# Patient Record
Sex: Male | Born: 1985 | Race: Black or African American | Hispanic: No | Marital: Single | Smoking: Former smoker
Health system: Southern US, Community
[De-identification: ages and names within clinical notes are randomized; demographics above are authoritative.]

## PROBLEM LIST (undated history)

## (undated) DIAGNOSIS — F419 Anxiety disorder, unspecified: Secondary | ICD-10-CM

## (undated) DIAGNOSIS — F32A Depression, unspecified: Secondary | ICD-10-CM

## (undated) DIAGNOSIS — F329 Major depressive disorder, single episode, unspecified: Secondary | ICD-10-CM

## (undated) DIAGNOSIS — G43909 Migraine, unspecified, not intractable, without status migrainosus: Secondary | ICD-10-CM

## (undated) DIAGNOSIS — IMO0001 Reserved for inherently not codable concepts without codable children: Secondary | ICD-10-CM

## (undated) DIAGNOSIS — R569 Unspecified convulsions: Secondary | ICD-10-CM

## (undated) DIAGNOSIS — Z531 Procedure and treatment not carried out because of patient's decision for reasons of belief and group pressure: Secondary | ICD-10-CM

## (undated) DIAGNOSIS — R51 Headache: Secondary | ICD-10-CM

## (undated) DIAGNOSIS — E109 Type 1 diabetes mellitus without complications: Secondary | ICD-10-CM

## (undated) DIAGNOSIS — R519 Headache, unspecified: Secondary | ICD-10-CM

## (undated) DIAGNOSIS — Z21 Asymptomatic human immunodeficiency virus [HIV] infection status: Secondary | ICD-10-CM

## (undated) DIAGNOSIS — B20 Human immunodeficiency virus [HIV] disease: Secondary | ICD-10-CM

## (undated) HISTORY — PX: FRACTURE SURGERY: SHX138

---
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
## 2006-09-21 ENCOUNTER — Ambulatory Visit: Payer: Self-pay | Admitting: Internal Medicine

## 2006-09-21 ENCOUNTER — Encounter (INDEPENDENT_AMBULATORY_CARE_PROVIDER_SITE_OTHER): Payer: Self-pay | Admitting: Nurse Practitioner

## 2006-09-21 ENCOUNTER — Emergency Department (HOSPITAL_COMMUNITY): Admission: EM | Admit: 2006-09-21 | Discharge: 2006-09-21 | Payer: Self-pay | Admitting: Emergency Medicine

## 2006-09-21 LAB — CONVERTED CEMR LAB: Microalb, Ur: 0.2 mg/dL (ref 0.00–1.89)

## 2006-09-22 ENCOUNTER — Ambulatory Visit: Payer: Self-pay | Admitting: Internal Medicine

## 2006-10-04 ENCOUNTER — Ambulatory Visit: Payer: Self-pay | Admitting: Family Medicine

## 2006-11-23 ENCOUNTER — Ambulatory Visit: Payer: Self-pay | Admitting: Internal Medicine

## 2006-12-13 ENCOUNTER — Ambulatory Visit: Payer: Self-pay | Admitting: *Deleted

## 2007-03-04 ENCOUNTER — Emergency Department (HOSPITAL_COMMUNITY): Admission: EM | Admit: 2007-03-04 | Discharge: 2007-03-04 | Payer: Self-pay | Admitting: Emergency Medicine

## 2007-03-08 ENCOUNTER — Ambulatory Visit: Payer: Self-pay | Admitting: Internal Medicine

## 2007-08-15 ENCOUNTER — Ambulatory Visit: Payer: Self-pay | Admitting: Internal Medicine

## 2008-01-08 ENCOUNTER — Ambulatory Visit: Payer: Self-pay | Admitting: Family Medicine

## 2008-05-01 ENCOUNTER — Ambulatory Visit: Payer: Self-pay | Admitting: Family Medicine

## 2008-07-14 ENCOUNTER — Ambulatory Visit: Payer: Self-pay | Admitting: Internal Medicine

## 2008-08-05 ENCOUNTER — Ambulatory Visit: Payer: Self-pay | Admitting: Internal Medicine

## 2009-05-29 ENCOUNTER — Emergency Department (HOSPITAL_COMMUNITY): Admission: EM | Admit: 2009-05-29 | Discharge: 2009-05-30 | Payer: Self-pay | Admitting: Emergency Medicine

## 2009-08-21 ENCOUNTER — Ambulatory Visit: Payer: Self-pay | Admitting: Internal Medicine

## 2009-08-21 LAB — CONVERTED CEMR LAB: Microalb, Ur: 5.5 mg/dL — ABNORMAL HIGH (ref 0.00–1.89)

## 2009-11-13 ENCOUNTER — Emergency Department (HOSPITAL_COMMUNITY): Admission: EM | Admit: 2009-11-13 | Discharge: 2009-11-13 | Payer: Self-pay | Admitting: Emergency Medicine

## 2009-11-16 ENCOUNTER — Emergency Department (HOSPITAL_COMMUNITY): Admission: EM | Admit: 2009-11-16 | Discharge: 2009-11-16 | Payer: Self-pay | Admitting: Emergency Medicine

## 2010-04-09 ENCOUNTER — Emergency Department (HOSPITAL_COMMUNITY)
Admission: EM | Admit: 2010-04-09 | Discharge: 2010-04-09 | Disposition: A | Discharge status: Home / Self Care | Payer: Self-pay | Attending: Emergency Medicine | Admitting: Emergency Medicine

## 2010-04-09 DIAGNOSIS — Z794 Long term (current) use of insulin: Secondary | ICD-10-CM | POA: Insufficient documentation

## 2010-04-09 DIAGNOSIS — K089 Disorder of teeth and supporting structures, unspecified: Secondary | ICD-10-CM | POA: Insufficient documentation

## 2010-04-09 DIAGNOSIS — R599 Enlarged lymph nodes, unspecified: Secondary | ICD-10-CM | POA: Insufficient documentation

## 2010-04-09 DIAGNOSIS — J029 Acute pharyngitis, unspecified: Secondary | ICD-10-CM | POA: Insufficient documentation

## 2010-04-09 DIAGNOSIS — R5383 Other fatigue: Secondary | ICD-10-CM | POA: Insufficient documentation

## 2010-04-09 DIAGNOSIS — H9209 Otalgia, unspecified ear: Secondary | ICD-10-CM | POA: Insufficient documentation

## 2010-04-09 DIAGNOSIS — E119 Type 2 diabetes mellitus without complications: Secondary | ICD-10-CM | POA: Insufficient documentation

## 2010-04-09 DIAGNOSIS — R5381 Other malaise: Secondary | ICD-10-CM | POA: Insufficient documentation

## 2010-04-09 DIAGNOSIS — R509 Fever, unspecified: Secondary | ICD-10-CM | POA: Insufficient documentation

## 2010-04-09 DIAGNOSIS — K029 Dental caries, unspecified: Secondary | ICD-10-CM | POA: Insufficient documentation

## 2010-04-09 LAB — GLUCOSE, CAPILLARY: Glucose-Capillary: 245 mg/dL — ABNORMAL HIGH (ref 70–99)

## 2010-05-13 LAB — GLUCOSE, CAPILLARY
Glucose-Capillary: 133 mg/dL — ABNORMAL HIGH (ref 70–99)
Glucose-Capillary: 145 mg/dL — ABNORMAL HIGH (ref 70–99)

## 2010-05-13 LAB — POCT URINALYSIS DIPSTICK
Bilirubin Urine: NEGATIVE
Hgb urine dipstick: NEGATIVE
Ketones, ur: NEGATIVE mg/dL
Protein, ur: 30 mg/dL — AB
pH: 6.5 (ref 5.0–8.0)

## 2010-05-19 LAB — GLUCOSE, CAPILLARY: Glucose-Capillary: 227 mg/dL — ABNORMAL HIGH (ref 70–99)

## 2010-05-19 LAB — POCT I-STAT, CHEM 8
BUN: 8 mg/dL (ref 6–23)
Calcium, Ion: 1.19 mmol/L (ref 1.12–1.32)
Creatinine, Ser: 0.8 mg/dL (ref 0.4–1.5)
Glucose, Bld: 214 mg/dL — ABNORMAL HIGH (ref 70–99)
Hemoglobin: 18.4 g/dL — ABNORMAL HIGH (ref 13.0–17.0)
TCO2: 25 mmol/L (ref 0–100)

## 2010-05-19 LAB — URINALYSIS, ROUTINE W REFLEX MICROSCOPIC
Glucose, UA: >1000 mg/dL — AB
Ketones, ur: 40 mg/dL — AB
Leukocytes, UA: NEGATIVE
Nitrite: NEGATIVE
Specific Gravity, Urine: >1.046 — ABNORMAL HIGH (ref 1.005–1.030)
pH: 5.5 (ref 5.0–8.0)

## 2010-05-19 LAB — URINE MICROSCOPIC-ADD ON

## 2010-07-16 NOTE — H&P (Signed)
[facility]  Patient:    [name]                        MRN: [id number] Adm. Date:  [id number] Attending:  Julian Hy CC:         Tera Mater. Evlyn Kanner, M.D.                         History and Physical  CHIEF COMPLAINT:  Diabetic ketoacidosis.  HISTORY OF PRESENT ILLNESS:  The patient is a 25 year old black male with a history of diabetes mellitus type 1 and medical noncompliance.  Today he awoke at approximately 0630 hours with nausea, vomiting, and headache.  He had been fine at bedtime last night, and claims to be taking his insulin regularly and checking is blood sugar levels three times daily.  He denies symptoms of an upper respiratory infection but complains of mild left-sided abdominal pain.  He has no recent ill contacts.  Currently he complains of moderate nausea, with no vomiting since presenting to the emergency room.  His headache is approximately 5/10 in intensity. He denies symptoms of dysuria or diarrhea.  He had been admitted from February 23, 1999 to February 24, 1999 and did well with intravenous fluids and insulin treatment for mild diabetic ketoacidosis.  PAST MEDICAL HISTORY:  He has had diabetes mellitus type 1 since age 48 years. e has about three episodes of ketoacidosis per year.  According to his previous endocrinologist, he generally has a hemoglobin A1C level in the 13 range.  He has a history of asthma and seasonal allergic rhinitis.  MEDICATIONS PRIOR TO ADMISSION:  Humulin Ultralente 10 units subcu in a.m. with  Humalog insulin 5 units subcu  in a.m.  Humulin Ultralente 7 units subcu at dinnertime, with Humalog 5 units subcu at dinnertime.  ALLERGIES:  No known drug allergies.  PAST SURGICAL HISTORY:  None.  FAMILY HISTORY:  Father died at age 23 years of gunshot wound.  Mother is 58 years old, has hypertension, but is otherwise alive and well.  He has three sisters.  There are multiple  grandparents with diabetes mellitus.  SOCIAL HISTORY:  He lives with his mother and siblings in [place], [place]. Currently he is staying with his grandmother and great-grandmother in the [place] area since the start of the Christmas break.  He denies tobacco or alcohol use.  REVIEW OF SYSTEMS:  Significant for a moderately severe headache, with nausea and vomiting today.  He denies fever, diarrhea, or dysuria.  PHYSICAL EXAMINATION:  VITAL SIGNS:  Blood pressure 129/78, pulse 98, respiratory rate 28, temperature  99.8.  Oxygen saturation on room air 98%.  GENERAL:  Well-nourished, well-developed, black male who appears somewhat withdrawn.  He speaks only with great reluctance, but is alert and oriented x 4. The history was generally obtained from family members present as he did not respond to questions unless directly asked.  HEENT:  Pupils were equal, round, and reactive to light and accommodation. Extraocular movements were intact.  Fundus was difficult to examine as he would not open his eyes well and did not cooperate with that part of the examination. TMs were normal.  Throat was within normal limits.  NECK:  Supple with a full range of motion.  CHEST:  Clear to auscultation.  HEART:  Regular rate and rhythm, S1 and S2 without murmur, gallop, or rub.  ABDOMEN:  Normal bowel  sounds with no evidence of hepatosplenomegaly.  There was very mild left upper quadrant tenderness without rebound.  EXTREMITIES:  Without cyanosis, clubbing, or edema.  NEUROLOGIC:  He is lying with his eyes closed but will open his eyes if asked to do so, and he is oriented x 4.  Cranial nerves II-XII are intact.  Sensory exam was grossly normal.  Motor exam had 4/5 muscle strength in all extremities. However, it is unclear if the weakness is due to poor effort on his part.  Cerebellar function was not assessed, nor was gait.  LABORATORY STUDIES:  WBC 5.6, hemoglobin 15.8,  hematocrit 46.3, platelet count 31. Serum sodium 135, potassium 5.0, chloride 104, CO2 10, BUN 22, creatinine 1.1, glucose 373, calcium 11.0, albumin 4.7, total bilirubin 1.5, alkaline phosphatase 257, ALT 19.  Urinalysis:  Specific gravity 1.028, pH 5.0, glucose greater than  1000, nitrite negative.  Ketones greater than than 80 mg/dL.  Arterial blood gas: pH 7.22, pCO2 22.  IMPRESSION: 1. Diabetic ketoacidosis.  This is due to relatively inadequate insulin treatment. It is unclear if he is complying with his insulin regimen even though he denies this.  He may have an occult viral infection, but he has a paucity of symptoms to suggest this.  He is in need of additional teaching regarding diabetes, specifically with regards to sick-day management and sliding scale Humalog treatment.  He may do a bit better if his Ultralente insulin is given at bedtime to ensure adequate levels in the morning.  I plan to initiate IV insulin treatment and give supplemental potassium as needed with intravenous fluids.  2. He will be given further teaching regarding flexible insulin dosing while an  inpatient.  3. Asthma.  This appears to be stable at present.  4. Headache.  This is likely due to his ketoacidosis.  I doubt that he has incipient cerebral edema, although this is possible with diabetic ketoacidosis. If his headache worsens, we will obtain a CT scan to evaluate for this unlikely possibility.  I doubt that he has meningitis with the lack of fever and a stiff  neck.DD:  02/27/99 TD:  02/27/99 Job: [id number]

## 2010-09-12 ENCOUNTER — Emergency Department (HOSPITAL_COMMUNITY)
Admission: EM | Admit: 2010-09-12 | Discharge: 2010-09-12 | Disposition: A | Discharge status: Home / Self Care | Payer: Self-pay | Attending: Emergency Medicine | Admitting: Emergency Medicine

## 2010-09-12 DIAGNOSIS — K047 Periapical abscess without sinus: Secondary | ICD-10-CM | POA: Insufficient documentation

## 2010-09-12 DIAGNOSIS — Z794 Long term (current) use of insulin: Secondary | ICD-10-CM | POA: Insufficient documentation

## 2010-09-12 DIAGNOSIS — E119 Type 2 diabetes mellitus without complications: Secondary | ICD-10-CM | POA: Insufficient documentation

## 2010-09-12 DIAGNOSIS — R220 Localized swelling, mass and lump, head: Secondary | ICD-10-CM | POA: Insufficient documentation

## 2010-09-12 DIAGNOSIS — K089 Disorder of teeth and supporting structures, unspecified: Secondary | ICD-10-CM | POA: Insufficient documentation

## 2010-09-12 DIAGNOSIS — R221 Localized swelling, mass and lump, neck: Secondary | ICD-10-CM | POA: Insufficient documentation

## 2010-09-15 ENCOUNTER — Emergency Department (HOSPITAL_COMMUNITY)
Admission: EM | Admit: 2010-09-15 | Discharge: 2010-09-15 | Disposition: A | Discharge status: Home / Self Care | Payer: Self-pay | Attending: Emergency Medicine | Admitting: Emergency Medicine

## 2010-09-15 DIAGNOSIS — R220 Localized swelling, mass and lump, head: Secondary | ICD-10-CM | POA: Insufficient documentation

## 2010-09-15 DIAGNOSIS — K089 Disorder of teeth and supporting structures, unspecified: Secondary | ICD-10-CM | POA: Insufficient documentation

## 2010-09-15 DIAGNOSIS — R112 Nausea with vomiting, unspecified: Secondary | ICD-10-CM | POA: Insufficient documentation

## 2010-09-15 DIAGNOSIS — R42 Dizziness and giddiness: Secondary | ICD-10-CM | POA: Insufficient documentation

## 2010-09-15 DIAGNOSIS — E119 Type 2 diabetes mellitus without complications: Secondary | ICD-10-CM | POA: Insufficient documentation

## 2010-09-15 DIAGNOSIS — K047 Periapical abscess without sinus: Secondary | ICD-10-CM | POA: Insufficient documentation

## 2010-10-02 ENCOUNTER — Emergency Department (HOSPITAL_COMMUNITY): Payer: Self-pay

## 2010-10-02 ENCOUNTER — Inpatient Hospital Stay (HOSPITAL_COMMUNITY)
Admission: EM | Admit: 2010-10-02 | Discharge: 2010-10-05 | DRG: 638 | Disposition: A | Discharge status: Home / Self Care | Payer: Self-pay | Attending: Internal Medicine | Admitting: Internal Medicine

## 2010-10-02 ENCOUNTER — Encounter: Payer: Self-pay | Admitting: Internal Medicine

## 2010-10-02 DIAGNOSIS — B9789 Other viral agents as the cause of diseases classified elsewhere: Secondary | ICD-10-CM | POA: Diagnosis present

## 2010-10-02 DIAGNOSIS — E109 Type 1 diabetes mellitus without complications: Secondary | ICD-10-CM

## 2010-10-02 DIAGNOSIS — E1110 Type 2 diabetes mellitus with ketoacidosis without coma: Secondary | ICD-10-CM | POA: Insufficient documentation

## 2010-10-02 DIAGNOSIS — J45901 Unspecified asthma with (acute) exacerbation: Secondary | ICD-10-CM | POA: Diagnosis present

## 2010-10-02 DIAGNOSIS — E1010 Type 1 diabetes mellitus with ketoacidosis without coma: Secondary | ICD-10-CM

## 2010-10-02 DIAGNOSIS — J45909 Unspecified asthma, uncomplicated: Secondary | ICD-10-CM | POA: Insufficient documentation

## 2010-10-02 DIAGNOSIS — Z794 Long term (current) use of insulin: Secondary | ICD-10-CM

## 2010-10-02 DIAGNOSIS — J069 Acute upper respiratory infection, unspecified: Secondary | ICD-10-CM | POA: Diagnosis present

## 2010-10-02 DIAGNOSIS — Z23 Encounter for immunization: Secondary | ICD-10-CM

## 2010-10-02 LAB — POCT I-STAT 3, VENOUS BLOOD GAS (G3P V)
Acid-base deficit: 12 mmol/L — ABNORMAL HIGH (ref 0.0–2.0)
Bicarbonate: 12.5 mEq/L — ABNORMAL LOW (ref 20.0–24.0)
TCO2: 13 mmol/L (ref 0–100)

## 2010-10-02 LAB — HEPATIC FUNCTION PANEL
ALT: 84 U/L — ABNORMAL HIGH (ref 0–53)
AST: 64 U/L — ABNORMAL HIGH (ref 0–37)
Albumin: 3.7 g/dL (ref 3.5–5.2)
Alkaline Phosphatase: 92 U/L (ref 39–117)
Total Protein: 7.4 g/dL (ref 6.0–8.3)

## 2010-10-02 LAB — BASIC METABOLIC PANEL
BUN: 13 mg/dL (ref 6–23)
CO2: 22 mEq/L (ref 19–32)
Calcium: 10 mg/dL (ref 8.4–10.5)
Chloride: 92 mEq/L — ABNORMAL LOW (ref 96–112)
Chloride: 104 mEq/L (ref 96–112)
Chloride: 107 mEq/L (ref 96–112)
Creatinine, Ser: 0.95 mg/dL (ref 0.50–1.35)
Creatinine, Ser: 0.74 mg/dL (ref 0.50–1.35)
GFR calc Af Amer: >60 mL/min (ref >60)
GFR calc Af Amer: >60 mL/min (ref >60)
Glucose, Bld: 320 mg/dL — ABNORMAL HIGH (ref 70–99)
Glucose, Bld: 207 mg/dL — ABNORMAL HIGH (ref 70–99)
Potassium: 4.4 mEq/L (ref 3.5–5.1)
Sodium: 131 mEq/L — ABNORMAL LOW (ref 135–145)
Sodium: 137 mEq/L (ref 135–145)

## 2010-10-02 LAB — DIFFERENTIAL
Eosinophils Absolute: 0 10*3/uL (ref 0.0–0.7)
Lymphs Abs: 0.8 10*3/uL (ref 0.7–4.0)
Monocytes Absolute: 0.3 10*3/uL (ref 0.1–1.0)
Monocytes Relative: 2 % — ABNORMAL LOW (ref 3–12)
Neutrophils Relative %: 91 % — ABNORMAL HIGH (ref 43–77)

## 2010-10-02 LAB — HEMOGLOBIN A1C: Mean Plasma Glucose: 346 mg/dL — ABNORMAL HIGH (ref <117)

## 2010-10-02 LAB — GLUCOSE, CAPILLARY
Glucose-Capillary: >600 mg/dL (ref 70–99)
Glucose-Capillary: 362 mg/dL — ABNORMAL HIGH (ref 70–99)
Glucose-Capillary: 297 mg/dL — ABNORMAL HIGH (ref 70–99)

## 2010-10-02 LAB — POCT I-STAT, CHEM 8
Calcium, Ion: 1.19 mmol/L (ref 1.12–1.32)
Creatinine, Ser: 0.8 mg/dL (ref 0.50–1.35)
Glucose, Bld: 676 mg/dL (ref 70–99)
HCT: 48 % (ref 39.0–52.0)
Hemoglobin: 16.3 g/dL (ref 13.0–17.0)
Potassium: 4.5 mEq/L (ref 3.5–5.1)
TCO2: 13 mmol/L (ref 0–100)

## 2010-10-02 LAB — CBC
MCH: 30.9 pg (ref 26.0–34.0)
MCHC: 35.2 g/dL (ref 30.0–36.0)
MCV: 87.8 fL (ref 78.0–100.0)
Platelets: 292 10*3/uL (ref 150–400)
RBC: 4.66 MIL/uL (ref 4.22–5.81)

## 2010-10-02 LAB — URINALYSIS, ROUTINE W REFLEX MICROSCOPIC
Bilirubin Urine: NEGATIVE
Glucose, UA: >1000 mg/dL — AB
Hgb urine dipstick: NEGATIVE
Ketones, ur: >80 mg/dL — AB
pH: 5 (ref 5.0–8.0)

## 2010-10-02 LAB — STREP PNEUMONIAE URINARY ANTIGEN: Strep Pneumo Urinary Antigen: NEGATIVE

## 2010-10-02 LAB — HIV ANTIBODY (ROUTINE TESTING W REFLEX): HIV: NONREACTIVE

## 2010-10-02 NOTE — H&P (Unsigned)
[name] is an 25 y.o. male with a history of DM type I and asthma presenting to the ED today for fatigue and chest pain   Chief Complaint: *** HPI: ***  Yesterday [name] reports beginning to feel sinus congestion, rhinorrhea, cough, and a pleuritic chest pain.  He also reports feeling feverish and having chills, though he did not take his temperature.  The symptoms began in the morning and have progressed since then.  He took tylenol which relieved his chest pain and chills for a couple of hours.  Over night he called his sister to ask her to bring a nebulizer treatment because he was having trouble breathing.  She reassured him that he was just having cold symptoms and did not bring the nebulizer.  This morning he reports feeling extremely fatigued and weak.  He says that it took all he could manage to get out of bed to come in.  When he did get up, he reported feeling nauseated and vomited once.  Since that time he has had a nagging pain in his RLQ, which is resolving.    Patient endorses having some loose stools beginning a few days ago.  He also reports polyuria and polydipsia beginning yesterday.  He reports infrequent checks of his blood sugar.  He  had to switch from 75/25 to 70/30 insulin regimen in April, which he feels has been to the detriment of his glucose control.  Since that time, he has noticed a significant weight loss.  He was near 200 lbs in April; this weight loss also coincides with the start of a new, more active job.  This change to his insulin medications occurred when he stopped being seen at [facility].  He reports that sometimes his medications are hard to obtain due to financial concerns.  PMH: ***  DM Type I: takes insulin 70/30.  Not vigilant about checking BG.  Does not have PCP  Asthma: not currently taking any medication, does report occasional wheezing  No past surgical history  Family History: Mother: Cornea replacement, hypothyroidism, Father:  healthy, deceased       Other: breast cancer, glaucoma, diabetes type I  Social History:  Currently living with a friend.  Recently employed in an active job- lifting and moving things.  Walks for exercise.   Former smoker, quit over 5 years ago.   ETOH- occasion, not during the summer when it's "too hot" No illicit drug use No herbal or OTC, except occasional acetaminophen   Allergies: Shellfish- anaphylaxis  ED Medication  1L NS bolus x3 IV insulin  IV Potassium 40 mEq Albuterol Ipratropium Prednisone 60 mg x 1   No current outpatient prescriptions on file as of 10/02/2010.  Insulin 70/30  Results for orders placed during the hospital encounter of 10/02/10 (from the past 48 hour(s))  POCT I-STAT, CHEM 8     Status: Abnormal   Collection Time   10/02/10 12:11 PM      Component Value Range Comment   Sodium 131 (*) 135 - 145 (mEq/L)    Potassium 4.5  3.5 - 5.1 (mEq/L)    Chloride 101  96 - 112 (mEq/L)    BUN 12  6 - 23 (mg/dL)    Creatinine, Ser 9.56  0.50 - 1.35 (mg/dL)    Glucose, Bld 213 (*) 70 - 99 (mg/dL)    Calcium, Ion 0.86  1.12 - 1.32 (mmol/L)    TCO2 13  0 - 100 (mmol/L)    Hemoglobin  16.3  13.0 - 17.0 (g/dL)    HCT 04.5  40.9 - 81.1 (%)    Comment NOTIFIED PHYSICIAN   Anion Gap-16 Delt/Delt =/ 1  URINALYSIS, ROUTINE W REFLEX MICROSCOPIC     Status: Abnormal   Collection Time   10/02/10 12:12 PM      Component Value Range Comment   Color, Urine YELLOW  YELLOW     Appearance CLEAR  CLEAR     Specific Gravity, Urine 1.035 (*) 1.005 - 1.030     pH 5.0  5.0 - 8.0     Glucose, UA >1000 (*) NEGATIVE (mg/dL)    Hgb urine dipstick NEGATIVE  NEGATIVE     Bilirubin Urine NEGATIVE  NEGATIVE     Ketones, ur >80 (*) NEGATIVE (mg/dL)    Protein, ur NEGATIVE  NEGATIVE (mg/dL)    Urobilinogen, UA 0.2  0.0 - 1.0 (mg/dL)    Nitrite NEGATIVE  NEGATIVE     Leukocytes, UA NEGATIVE  NEGATIVE    URINE MICROSCOPIC-ADD ON     Status: Normal   Collection Time   10/02/10 12:12 PM       Component Value Range Comment   Urine-Other        Value: NO FORMED ELEMENTS SEEN ON URINE MICROSCOPIC EXAMINATION  DIFFERENTIAL WITH WBC     Status: Abnormal   Collection Time   10/02/10 12:15 PM      Component Value Range Comment   Neutrophils Relative 91 (*) 43 - 77 (%)    Neutro Abs 10.8 (*) 1.7 - 7.7 (K/uL)    Lymphocytes Relative 7 (*) 12 - 46 (%)    Lymphs Abs 0.8  0.7 - 4.0 (K/uL)    Monocytes Relative 2 (*) 3 - 12 (%)    Monocytes Absolute 0.3  0.1 - 1.0 (K/uL)    Eosinophils Relative 0  0 - 5 (%)    Eosinophils Absolute 0.0  0.0 - 0.7 (K/uL)    Basophils Relative 0  0 - 1 (%)    Basophils Absolute 0.0  0.0 - 0.1 (K/uL)   CBC     Status: Abnormal   Collection Time   10/02/10 12:15 PM      Component Value Range Comment   WBC 11.9 (*) 4.0 - 10.5 (K/uL)    RBC 4.66  4.22 - 5.81 (MIL/uL)    Hemoglobin 14.4  13.0 - 17.0 (g/dL)    HCT 91.4  78.2 - 95.6 (%)    MCV 87.8  78.0 - 100.0 (fL)    MCH 30.9  26.0 - 34.0 (pg)    MCHC 35.2  30.0 - 36.0 (g/dL)    RDW 21.3  08.6 - 57.8 (%)    Platelets 292  150 - 400 (K/uL)   BASIC METABOLIC PANEL     Status: Abnormal   Collection Time   10/02/10 12:15 PM      Component Value Range Comment   Sodium 131 (*) 135 - 145 (mEq/L)    Potassium 4.5  3.5 - 5.1 (mEq/L)    Chloride 92 (*) 96 - 112 (mEq/L) DELTA CHECK NOTED   CO2 13 (*) 19 - 32 (mEq/L)    Glucose, Bld 662 (*) 70 - 99 (mg/dL)    BUN 13  6 - 23 (mg/dL)    Creatinine, Ser 4.69  0.50 - 1.35 (mg/dL)    Calcium 62.9  8.4 - 10.5 (mg/dL)    GFR calc non Af Amer >60  >60 (mL/min)  GFR calc Af Amer >60  >60 (mL/min)   GLUCOSE, CAPILLARY     Status: Abnormal   Collection Time   10/02/10 12:33 PM      Component Value Range Comment   Glucose-Capillary >600 (*) 70 - 99 (mg/dL)   POCT I-STAT 3, BLOOD GAS (G3P V)     Status: Abnormal   Collection Time   10/02/10 12:40 PM      Component Value Range Comment   pH, Ven 7.291  7.250 - 7.300     pCO2, Ven 25.9 (*) 45.0 - 50.0 (mmHg)    pO2, Ven  69.0 (*) 30.0 - 45.0 (mmHg)    Bicarbonate 12.5 (*) 20.0 - 24.0 (mEq/L)    TCO2 13  0 - 100 (mmol/L)    O2 Saturation 92.0      Acid-base deficit 12.0 (*) 0.0 - 2.0 (mmol/L)    Sample type VENOUS     GLUCOSE, CAPILLARY     Status: Abnormal   Collection Time   10/02/10  1:54 PM      Component Value Range Comment   Glucose-Capillary >600 (*) 70 - 99 (mg/dL)    Comment 1 Notify RN      Comment 2 Call MD NNP PA CNM     GLUCOSE, CAPILLARY     Status: Abnormal   Collection Time   10/02/10  2:03 PM      Component Value Range Comment   Glucose-Capillary 594 (*) 70 - 99 (mg/dL)    @[id number] @  Review of Systems  Constitutional: Negative.        Except per HPI  Eyes: Positive for photophobia.  Respiratory: Negative.        Except per HPI  Cardiovascular: Negative.   Gastrointestinal: Negative.   Genitourinary: Negative.   Musculoskeletal: Negative.   Skin: Negative.   Neurological: Positive for headaches.  Endo/Heme/Allergies: Negative.        Except per HPI  Psychiatric/Behavioral: Negative.     BVitals: Temp: 98.8  BP: 129/77  HR: 107  Resp: 20  O2sat: 100% on RA  Physical Exam  Constitutional: He is oriented to person, place, and time. He appears well-developed. No distress.  HENT:  Head: Normocephalic and atraumatic.  Right Ear: External ear normal.  Left Ear: External ear normal.  Mouth/Throat: No oropharyngeal exudate.       Erythematous oropharynx  Eyes: Conjunctivae and EOM are normal. Pupils are equal, round, and reactive to light. Right eye exhibits no discharge. Left eye exhibits no discharge. No scleral icterus.  Neck: Normal range of motion. Neck supple. No tracheal deviation present.  Cardiovascular: Regular rhythm, normal heart sounds and intact distal pulses.  Exam reveals no gallop and no friction rub.   No murmur heard.      tachycardic  Respiratory: Effort normal. No stridor. No respiratory distress. He has wheezes. He has no rales. He exhibits no  tenderness.  GI: Soft. Bowel sounds are normal. He exhibits no distension and no mass. There is tenderness. There is no rebound and no guarding.       Mild tenderness to palpation in RLQ  Musculoskeletal: Normal range of motion. He exhibits no edema and no tenderness.  Lymphadenopathy:    He has cervical adenopathy.  Neurological: He is alert and oriented to person, place, and time. He has normal reflexes. No cranial nerve deficit. He exhibits normal muscle tone.  Skin: Skin is warm and dry. No rash noted. He is not diaphoretic. No erythema. No pallor.  Hypopigmented scar on right tibia 2/2 trauma about 6 weeks ago  Psychiatric: He has a normal mood and affect. His behavior is normal. Judgment and thought content normal.     Assessment/Plan ***[name] is a 25 y/o man in DKA.  1.  DKA- anion gap of 16, pH of 7.291>1000 ketones in urine, BG 662 with concurrent metabolic alkalosis, most likely 2/2/ vomitting - Admit pt. Inpatient, ICU bed - 1L IV NS x 3 given in ER - IV insulin, per DKA protocol - 40 mEq K+ given in ED.  Replete per DKA protocol - BMET stat q2 until gap resolves - CBG q2  - NPO - Evaluate for infectious source - BCx - S. pneumo and legionella urine ag - MRSA PCR screening - Empiric moxifloxacin 400 mg  2.  DM type 1- currently in DKA - HbA1c - resume maintenance treatment with resolution of DKA - Pt. Needs PCP - Pt. Needs assistance with medication financing  3. Asthma- not taking any medications at home currently, Bilateral basilar expiratory wheezes. - Was given prednisone 60 mg PO x1, will not continue due to DKA - Albuterol inhaler q4 - Ipratropium inhaler q8   4.  Prophylaxis/Public Health:  - lovenox - HIV ab   [name] 10/02/2010, 3:07 PM

## 2010-10-03 LAB — BASIC METABOLIC PANEL
BUN: 10 mg/dL (ref 6–23)
BUN: 10 mg/dL (ref 6–23)
BUN: 10 mg/dL (ref 6–23)
BUN: 11 mg/dL (ref 6–23)
CO2: 23 mEq/L (ref 19–32)
Calcium: 9.2 mg/dL (ref 8.4–10.5)
Chloride: 107 mEq/L (ref 96–112)
Chloride: 105 mEq/L (ref 96–112)
Creatinine, Ser: 0.64 mg/dL (ref 0.50–1.35)
Creatinine, Ser: 0.91 mg/dL (ref 0.50–1.35)
GFR calc Af Amer: >60 mL/min (ref >60)
GFR calc non Af Amer: >60 mL/min (ref >60)
GFR calc non Af Amer: >60 mL/min (ref >60)
Glucose, Bld: 93 mg/dL (ref 70–99)
Glucose, Bld: 211 mg/dL — ABNORMAL HIGH (ref 70–99)
Glucose, Bld: 248 mg/dL — ABNORMAL HIGH (ref 70–99)
Potassium: 3.8 mEq/L (ref 3.5–5.1)
Potassium: 3.9 mEq/L (ref 3.5–5.1)
Potassium: 3.9 mEq/L (ref 3.5–5.1)

## 2010-10-03 LAB — CBC
HCT: 36.8 % — ABNORMAL LOW (ref 39.0–52.0)
Hemoglobin: 12.9 g/dL — ABNORMAL LOW (ref 13.0–17.0)
WBC: 11.4 10*3/uL — ABNORMAL HIGH (ref 4.0–10.5)

## 2010-10-03 LAB — GLUCOSE, CAPILLARY
Glucose-Capillary: 143 mg/dL — ABNORMAL HIGH (ref 70–99)
Glucose-Capillary: 96 mg/dL (ref 70–99)

## 2010-10-03 LAB — LEGIONELLA ANTIGEN, URINE

## 2010-10-03 LAB — AST: AST: 24 U/L (ref 0–37)

## 2010-10-03 LAB — ALT: ALT: 51 U/L (ref 0–53)

## 2010-10-04 DIAGNOSIS — E1010 Type 1 diabetes mellitus with ketoacidosis without coma: Secondary | ICD-10-CM

## 2010-10-04 LAB — BASIC METABOLIC PANEL
BUN: 11 mg/dL (ref 6–23)
CO2: 26 mEq/L (ref 19–32)
CO2: 26 mEq/L (ref 19–32)
Calcium: 9.1 mg/dL (ref 8.4–10.5)
Chloride: 105 mEq/L (ref 96–112)
Chloride: 102 mEq/L (ref 96–112)
Creatinine, Ser: 0.72 mg/dL (ref 0.50–1.35)
GFR calc Af Amer: >60 mL/min (ref >60)
Glucose, Bld: 156 mg/dL — ABNORMAL HIGH (ref 70–99)
Sodium: 137 mEq/L (ref 135–145)

## 2010-10-04 LAB — GLUCOSE, CAPILLARY
Glucose-Capillary: 230 mg/dL — ABNORMAL HIGH (ref 70–99)
Glucose-Capillary: 175 mg/dL — ABNORMAL HIGH (ref 70–99)
Glucose-Capillary: 223 mg/dL — ABNORMAL HIGH (ref 70–99)
Glucose-Capillary: 82 mg/dL (ref 70–99)

## 2010-10-04 LAB — CBC
HCT: 40.3 % (ref 39.0–52.0)
Hemoglobin: 14.1 g/dL (ref 13.0–17.0)
MCH: 30.1 pg (ref 26.0–34.0)
MCV: 85.9 fL (ref 78.0–100.0)
RBC: 4.69 MIL/uL (ref 4.22–5.81)

## 2010-10-05 LAB — CBC
HCT: 39.4 % (ref 39.0–52.0)
MCHC: 35 g/dL (ref 30.0–36.0)
MCV: 86.6 fL (ref 78.0–100.0)
Platelets: 288 10*3/uL (ref 150–400)
RDW: 14.5 % (ref 11.5–15.5)
WBC: 8.5 10*3/uL (ref 4.0–10.5)

## 2010-10-05 LAB — MAGNESIUM: Magnesium: 1.9 mg/dL (ref 1.5–2.5)

## 2010-10-05 LAB — BASIC METABOLIC PANEL
BUN: 12 mg/dL (ref 6–23)
Chloride: 102 mEq/L (ref 96–112)
Creatinine, Ser: 0.69 mg/dL (ref 0.50–1.35)
GFR calc Af Amer: >60 mL/min (ref >60)
GFR calc non Af Amer: >60 mL/min (ref >60)

## 2010-10-05 LAB — GLUCOSE, CAPILLARY

## 2010-10-06 ENCOUNTER — Encounter: Payer: Self-pay | Admitting: Internal Medicine

## 2010-10-06 ENCOUNTER — Other Ambulatory Visit: Payer: Self-pay | Admitting: Internal Medicine

## 2010-10-06 DIAGNOSIS — E1110 Type 2 diabetes mellitus with ketoacidosis without coma: Secondary | ICD-10-CM

## 2010-10-06 DIAGNOSIS — E109 Type 1 diabetes mellitus without complications: Secondary | ICD-10-CM

## 2010-10-06 NOTE — Discharge Summary (Signed)
[name], [name] NO.:  [id number]  MEDICAL RECORD NO.:  [id number]  LOCATION:  5002                         FACILITY:  [facility]  PHYSICIAN:  Tilford Pillar, MD     DATE OF BIRTH:  06-06-1985  DATE OF ADMISSION:  10/02/2010 DATE OF DISCHARGE:  10/05/2010                              DISCHARGE SUMMARY   DISCHARGE DIAGNOSES: 1. Diabetic ketoacidosis. 2. Diabetes mellitus type 1. 3. Asthma. 4. Viral upper respiratory infection.  DISCHARGE MEDICATIONS: 1. Advair 500/50 inhaler 1 puff twice daily. 2. Albuterol 90 mcg inhaler 2 puffs every 4 hours as needed. 3. Moxifloxacin 400 mg 1 tablet by mouth daily for 5 days. 4. Humulin 70/30, 35 units subcutaneous twice daily with meals.  DISPOSITION AND FOLLOWUP:  The patient was discharged from [facility] on October 05, 2010, in stable and improved condition.  The patient will follow up with Dr. Thad Ranger in the [facility] on October 19, 2010, at 11:15 a.m. for further management of diabetes given a hemoglobin A1c of 13.5, and further management of asthma.  PROCEDURES PERFORMED: 1. Chest x-ray, October 02, 2010, no acute abnormalities, mild chronic     diffuse interstitial thickening, likely due to asthma. 2. EKG, October 02, 2010, sinus tachycardia, no ST changes.  CONSULTATIONS:  None.  ADMITTING HISTORY AND PHYSICAL:  The patient is a 25 year old man with a history of diabetes type 1 and asthma, presenting to the emergency department for fatigue and chest pain.  The patient has a 1 day history of feeling extremely fatigued and weak.  He also endorses polyuria and polydipsia during this time.  He also endorses a 20-pound weight loss over the last 4 months.  He believes this change is related to a change in his home insulin from a 75/25 regimen to a 70/30 regimen, which he has been managing on his own without a physician.  The patient also reports a 1-day history of sinus congestion,  rhinorrhea, cough, and pleuritic chest pain.  Cough is productive of a yellowish sputum.  He also reports feeling feverish and having chills though he did not take his temperature.  Tylenol relieved his pain and chills for a couple of hours.  He reports a history of asthma but has not had an exacerbation in over 15 years and reports no prior hospitalization, intubation, or steroid use for his asthma.  PHYSICAL EXAMINATION:  VITAL SIGNS:  Temperature 98.8, blood pressure 129/77, heart rate 107, respirations 20, oxygen saturation 100% on room air. GENERAL:  The patient is alert and oriented though appears lethargic. HEENT:  Pupils equal, round, and reactive to light.  Oropharynx clear and nonerythematous. CARDIOVASCULAR:  Tachycardic.  Regular rhythm.  No murmurs, gallops, or rubs. RESPIRATORY:  Significant inspiratory and expiratory wheezes auscultated bilaterally throughout all lung fields. ABDOMEN:  Soft.  Mild tenderness to palpation in the right lower quadrant.  No distention.  Positive bowel sounds.  No rebound, no guarding. EXTREMITIES:  No cyanosis, clubbing, or edema. NEUROLOGIC:  Alert and oriented x3 though appears lethargic.  Cranial nerves II-XII intact.  Strength 5/5 throughout.  Sensation intact throughout.  LABORATORY DATA ON ADMISSION:  WBC 11.9, hemoglobin 14.4, hematocrit  40.9, platelets 292.  Sodium 131, potassium 4.5, chloride 92, bicarb 13, BUN 13, creatinine 0.95, glucose 662.  Urinalysis negative for nitrites and leukocytes, positive for greater than 1000 glucose, greater than 80 ketones.  Hemoglobin A1c 13.7.  ABG; pH 7.291, PCO2 25.9, PO2 69, bicarb 12.5.  HOSPITAL COURSE BY PROBLEM: 1. Diabetic ketoacidosis.  The patient was admitted with diabetic     ketoacidosis with anion gap of 26 and a blood glucose of 662.  He     was started on IV normal saline and IV insulin per the Glucomander     protocol.  Basic metabolic panels were checked every 2 hours  until     his anion gap was resolved which was about six hours after initial     presentation.  The patient experienced relief of fatigue and     nausea.  He was started on Lantus 30 units on his first day after     the resolution of DKA and then switched to his home NovoLog 70/30     mix, started on 30 units b.i.d., increased to 35 units b.i.d. on     discharge with blood sugars in the 100s-200s. 2. Diabetes mellitus type 1.  The patient has a history of diabetes     since childhood with admitting hemoglobin A1c of 13.7.  He notes     that he no longer has a primary care physician and has been buying     insulin over the counter and managing his dosages on his own at     home.  He does note that he checks his blood sugar twice a day     consistently.  The patient's blood glucose levels were controlled     in the hospital, and further management of his NovoLog will be     needed in the outpatient setting.  The patient will follow up at     the [facility] on October 19, 2010. 3. Asthma.  The patient was admitted with a 1-day history of cough,     congestion, and dyspnea, likely consistent with an asthma    exacerbation.  He was given scheduled DuoNeb and albuterol inhaler     as needed with slight improvement of his symptoms.  He was     subsequently started on Advair with significant improvement of his     symptoms.  Peak flow on the day of discharge was 500.  The patient     was discharged with Advair b.i.d. and albuterol as needed as well     as moxifloxacin to complete a total of 7-day course.  DISCHARGE VITALS:  VITAL SIGNS: Temperature 98.6, blood pressure 135/68, pulse 60, respirations 18, oxygen saturation 100% on room air.  DISCHARGE LABS:  WBC 8.5, hemoglobin 13.8, hematocrit 39.4, platelets 288.  Sodium 137, potassium 4.2, chloride 102, bicarb 23, BUN 12, creatinine 0.69.    ______________________________ Janalyn Harder,  MD   ______________________________ Tilford Pillar, MD    RB/MEDQ  D:  10/05/2010  T:  10/05/2010  Job:  [id number]  cc:   [name], DO  Electronically Signed by Janalyn Harder MD on 10/06/2010 08:03:14 AM Electronically Signed by Tilford Pillar  on 10/06/2010 02:57:34 PM

## 2010-10-07 ENCOUNTER — Telehealth: Payer: Self-pay | Admitting: Dietician

## 2010-10-07 DIAGNOSIS — E109 Type 1 diabetes mellitus without complications: Secondary | ICD-10-CM

## 2010-10-07 MED ORDER — "INSULIN SYRINGE-NEEDLE U-100 31G X 5/16"" 0.5 ML MISC": Status: DC

## 2010-10-07 NOTE — Telephone Encounter (Signed)
Patient requests Rx for syringes and glucometer. Discussed meter from [employer] health department after her gets orange card from [employer]. appointment made with CDE for 10/19/10 @ 12 PM. Encouraged patient to arrange for orange card prior to 10/19/10 appointment.   Per patient request will leave at front desk for him to pick up:   1- rx for syringes  2- True track meter with 10 strips  3- list of what he needs to bring to obtain orange card

## 2010-10-08 LAB — CULTURE, BLOOD (ROUTINE X 2)
Culture  Setup Time: 201208042115
Culture: NO GROWTH

## 2010-10-19 ENCOUNTER — Ambulatory Visit (INDEPENDENT_AMBULATORY_CARE_PROVIDER_SITE_OTHER): Payer: Self-pay | Admitting: Internal Medicine

## 2010-10-19 ENCOUNTER — Ambulatory Visit: Payer: Self-pay | Admitting: Dietician

## 2010-10-19 ENCOUNTER — Encounter: Payer: Self-pay | Admitting: Internal Medicine

## 2010-10-19 DIAGNOSIS — E109 Type 1 diabetes mellitus without complications: Secondary | ICD-10-CM

## 2010-10-19 DIAGNOSIS — J45909 Unspecified asthma, uncomplicated: Secondary | ICD-10-CM

## 2010-10-19 MED ORDER — FLUTICASONE-SALMETEROL 500-50 MCG/DOSE IN AEPB: 1.0000 | INHALATION_SPRAY | Freq: Two times a day (BID) | RESPIRATORY_TRACT | Status: DC

## 2010-10-19 MED ORDER — ALBUTEROL 90 MCG/ACT IN AERS: 2.0000 | INHALATION_SPRAY | RESPIRATORY_TRACT | Status: DC | PRN

## 2010-10-19 NOTE — Progress Notes (Signed)
  Subjective:    Patient ID: [name], male    DOB: [birth year], 25 y.o.   MRN: [id number]  HPI  The patient is a 25 year old male with asthma and diabetes who presents to clinic for followup due to his recent hospitalization and discharge. He was hospitalized for acute asthma attack and uncontrolled diabetes. He reports compliance with recommended medications as well as diet and exercise. He also reports feeling well since the hospitalization. He tells me that his asthma is well-controlled on current medication regimen. He is also compliant with insulin for her diabetes. She needs refill on his medication. He denies episodes of chest pain or shortness of breath, no fevers or chills, no abdominal or urinary concerns.  Review of Systems Constitutional: Denies fever, chills, diaphoresis, appetite change and fatigue.  HEENT: Denies photophobia, eye pain, redness, hearing loss, ear pain, congestion, sore throat, rhinorrhea, sneezing, mouth sores, trouble swallowing, neck pain, neck stiffness and tinnitus.   Respiratory: Denies SOB, DOE, cough, chest tightness,  and wheezing.   Cardiovascular: Denies chest pain, palpitations and leg swelling.  Gastrointestinal: Denies nausea, vomiting, abdominal pain, diarrhea, constipation, blood in stool and abdominal distention.  Genitourinary: Denies dysuria, urgency, frequency, hematuria, flank pain and difficulty urinating.  Musculoskeletal: Denies myalgias, back pain, joint swelling, arthralgias and gait problem.  Skin: Denies pallor, rash and wound.  Neurological: Denies dizziness, seizures, syncope, weakness, light-headedness, numbness and headaches.  Hematological: Denies adenopathy. Easy bruising, personal or family bleeding history  Psychiatric/Behavioral: Denies suicidal ideation, mood changes, confusion, nervousness, sleep disturbance and agitation      Objective:   Physical Exam  Constitutional: Vital signs reviewed.  Patient is a well-developed  and well-nourished  in no acute distress and cooperative with exam. Alert and oriented x3.  Head: Normocephalic and atraumatic Ear: TM normal bilaterally Mouth: no erythema or exudates, MMM Eyes: PERRL, EOMI, conjunctivae normal, No scleral icterus.  Neck: Supple, Trachea midline normal ROM, No JVD, mass, thyromegaly, or carotid bruit present.  Cardiovascular: RRR, S1 normal, S2 normal, no MRG, pulses symmetric and intact bilaterally Pulmonary/Chest: CTAB, no wheezes, rales, or rhonchi Abdominal: Soft. Non-tender, non-distended, bowel sounds are normal, no masses, organomegaly, or guarding present.         Assessment & Plan:

## 2010-10-19 NOTE — Assessment & Plan Note (Signed)
Patient was recently hospitalized for diabetic ketoacidosis. This was determined to be secondary to medical noncompliance. Education was provided as well as proper medication and glucose monitoring kit. Patient reports compliance with recommended diet and exercise as well as medication. We have discussed importance of compliance with recommendations and importance of regular followup. I will place a referral to [facility] diabetic specialist for further diabetes education. Patient is currently in the process of receiving Orange card at which point we will keep him a prescription for a new diabetic kit so he can monitor his sugar levels regularly.

## 2010-10-19 NOTE — Assessment & Plan Note (Signed)
Well-controlled on current medication regimen we will provide refills today.

## 2010-10-21 ENCOUNTER — Encounter: Payer: Self-pay | Admitting: Internal Medicine

## 2010-10-21 ENCOUNTER — Ambulatory Visit: Payer: Self-pay | Admitting: Dietician

## 2010-10-21 NOTE — Progress Notes (Signed)
  Subjective:    Patient ID: [name], male    DOB: [birth year], 25 y.o.   MRN: [id number]  HPI   error Review of Systems     Objective:   Physical Exam        Assessment & Plan:

## 2010-11-28 ENCOUNTER — Inpatient Hospital Stay (HOSPITAL_COMMUNITY)
Admission: EM | Admit: 2010-11-28 | Discharge: 2010-11-29 | DRG: 638 | Disposition: A | Discharge status: Home / Self Care | Payer: Self-pay | Attending: Family Medicine | Admitting: Family Medicine

## 2010-11-28 DIAGNOSIS — F17200 Nicotine dependence, unspecified, uncomplicated: Secondary | ICD-10-CM | POA: Diagnosis present

## 2010-11-28 DIAGNOSIS — Z9119 Patient's noncompliance with other medical treatment and regimen: Secondary | ICD-10-CM

## 2010-11-28 DIAGNOSIS — Z794 Long term (current) use of insulin: Secondary | ICD-10-CM

## 2010-11-28 DIAGNOSIS — N179 Acute kidney failure, unspecified: Secondary | ICD-10-CM | POA: Diagnosis present

## 2010-11-28 DIAGNOSIS — Z91199 Patient's noncompliance with other medical treatment and regimen due to unspecified reason: Secondary | ICD-10-CM

## 2010-11-28 DIAGNOSIS — E1010 Type 1 diabetes mellitus with ketoacidosis without coma: Principal | ICD-10-CM | POA: Diagnosis present

## 2010-11-28 LAB — CBC
MCV: 86.6 fL (ref 78.0–100.0)
Platelets: 214 10*3/uL (ref 150–400)
RBC: 5.29 MIL/uL (ref 4.22–5.81)
WBC: 7.2 10*3/uL (ref 4.0–10.5)

## 2010-11-28 LAB — GLUCOSE, CAPILLARY
Glucose-Capillary: 161 mg/dL — ABNORMAL HIGH (ref 70–99)
Glucose-Capillary: 274 mg/dL — ABNORMAL HIGH (ref 70–99)
Glucose-Capillary: 343 mg/dL — ABNORMAL HIGH (ref 70–99)
Glucose-Capillary: 424 mg/dL — ABNORMAL HIGH (ref 70–99)
Glucose-Capillary: 427 mg/dL — ABNORMAL HIGH (ref 70–99)

## 2010-11-28 LAB — BASIC METABOLIC PANEL
BUN: 22 mg/dL (ref 6–23)
BUN: 14 mg/dL (ref 6–23)
CO2: 11 mEq/L — ABNORMAL LOW (ref 19–32)
CO2: 20 mEq/L (ref 19–32)
Calcium: 9.2 mg/dL (ref 8.4–10.5)
Chloride: 93 mEq/L — ABNORMAL LOW (ref 96–112)
Chloride: 98 mEq/L (ref 96–112)
Creatinine, Ser: 1.04 mg/dL (ref 0.50–1.35)
GFR calc Af Amer: >60 mL/min (ref >60)
GFR calc Af Amer: >60 mL/min (ref >60)
GFR calc Af Amer: >60 mL/min (ref >60)
GFR calc non Af Amer: >60 mL/min (ref >60)
GFR calc non Af Amer: >60 mL/min (ref >60)
Glucose, Bld: 214 mg/dL — ABNORMAL HIGH (ref 70–99)
Glucose, Bld: 142 mg/dL — ABNORMAL HIGH (ref 70–99)
Potassium: 4.4 mEq/L (ref 3.5–5.1)
Potassium: 4.9 mEq/L (ref 3.5–5.1)
Potassium: 3.8 mEq/L (ref 3.5–5.1)
Sodium: 134 mEq/L — ABNORMAL LOW (ref 135–145)
Sodium: 132 mEq/L — ABNORMAL LOW (ref 135–145)
Sodium: 136 mEq/L (ref 135–145)

## 2010-11-28 LAB — URINALYSIS, ROUTINE W REFLEX MICROSCOPIC
Bilirubin Urine: NEGATIVE
Glucose, UA: >1000 mg/dL — AB
Hgb urine dipstick: NEGATIVE
Ketones, ur: >80 mg/dL — AB
Nitrite: NEGATIVE
Specific Gravity, Urine: 1.033 — ABNORMAL HIGH (ref 1.005–1.030)
pH: 5.5 (ref 5.0–8.0)

## 2010-11-28 LAB — MAGNESIUM: Magnesium: 2 mg/dL (ref 1.5–2.5)

## 2010-11-29 LAB — GLUCOSE, CAPILLARY
Glucose-Capillary: 104 mg/dL — ABNORMAL HIGH (ref 70–99)
Glucose-Capillary: 116 mg/dL — ABNORMAL HIGH (ref 70–99)
Glucose-Capillary: 117 mg/dL — ABNORMAL HIGH (ref 70–99)
Glucose-Capillary: 126 mg/dL — ABNORMAL HIGH (ref 70–99)
Glucose-Capillary: 127 mg/dL — ABNORMAL HIGH (ref 70–99)
Glucose-Capillary: 182 mg/dL — ABNORMAL HIGH (ref 70–99)
Glucose-Capillary: 194 mg/dL — ABNORMAL HIGH (ref 70–99)

## 2010-11-29 LAB — BASIC METABOLIC PANEL
BUN: 11 mg/dL (ref 6–23)
CO2: 22 mEq/L (ref 19–32)
Calcium: 8.9 mg/dL (ref 8.4–10.5)
Calcium: 8.9 mg/dL (ref 8.4–10.5)
Chloride: 107 mEq/L (ref 96–112)
Creatinine, Ser: 0.93 mg/dL (ref 0.50–1.35)
Creatinine, Ser: 0.81 mg/dL (ref 0.50–1.35)
GFR calc Af Amer: >90 mL/min (ref >90)
GFR calc Af Amer: >90 mL/min (ref >90)
GFR calc non Af Amer: >90 mL/min (ref >90)
GFR calc non Af Amer: >90 mL/min (ref >90)
Glucose, Bld: 155 mg/dL — ABNORMAL HIGH (ref 70–99)
Potassium: 3.3 mEq/L — ABNORMAL LOW (ref 3.5–5.1)
Sodium: 136 mEq/L (ref 135–145)
Sodium: 135 mEq/L (ref 135–145)

## 2010-11-30 NOTE — Discharge Summary (Addendum)
[name], [name] NO.:  [id number]  MEDICAL RECORD NO.:  [id number]  LOCATION:  1409                         FACILITY:  [facility]  PHYSICIAN:  Tarry Kos, MD       DATE OF BIRTH:  23-Jan-1986  DATE OF ADMISSION:  11/28/2010 DATE OF DISCHARGE:  11/29/2010                        DISCHARGE SUMMARY - REFERRING   PRIMARY CARE PROVIDER:  [facility], Dr. Thad Ranger.  DISCHARGE DIAGNOSES: 1. Diabetic ketoacidosis secondary to noncompliance. 2. Metabolic acidosis secondary to diabetic ketoacidosis. 3. Acute renal failure secondary to diabetic ketoacidosis. 4. Medical noncompliance. 5. Tobacco abuse.  DISCHARGE MEDICATIONS: 1. Nicotine patch 21 mg/24 hours transdermally daily. 2. Humulin 70/30, 35 units subcutaneously b.i.d. before meals.  DIAGNOSTIC LABS:  Sodium 134, potassium 4.4, chloride 93, CO2 of 13, BUN 22, creatinine 1.16, and glucose 546.  WBC 7.2, hemoglobin 16.4, and hematocrit 45.8.  Urinalysis was negative except for a greater than 80 ketones and greater than 1000 glucose.  Magnesium is 2.0.  Blood culture showed no growth to date.  DIAGNOSTIC IMAGING:  None.  CONSULTATIONS:  The patient was seen by the inpatient diabetes coordinator on August 1.  PROCEDURES:  None.  BRIEF HISTORY:  [name] is a 25 year old who has type 1 diabetes since the age of 38.  He presents to the [facility] ED on September 30 with a chief complaint of abdominal pain, nausea, and vomiting.  He reports he is not had regular medical care in recent years that he goes from pharmacy to pharmacy to get his insulin.  He reports he had not had any insulin for 2 days prior to his presentation.  He indicated that the day before presentation he developed polyuria, polydipsia, and nausea and vomiting.  The morning of his presentation, he continued with nausea and vomiting, and developed abdominal pain.  In the emergency room, his initial glucose was greater than  600.  HOSPITAL COURSE BY PROBLEM: 1. DKA secondary to noncompliance.  The patient was admitted to     telemetry unit and started on an insulin drip.  His BMET was     checked every 4 hours.  He quickly responded to this therapy along     with vigorous IV hydration.  His blood sugar quickly hit target,     his anion gap closed, and his bicarb was within the limits of     normal by November 29, 2010.  At that time, he was started on his     Novolin 70/30 in an hour, later his insulin drip was discontinued.     His diet was advanced.  At the time of discharge, he was tolerating     a regular diet without problem. 2. Metabolic acidosis secondary to DKA, resolved.  See problem #1. 3. Acute renal failure secondary to DKA.  At the time of discharge,     the patient's creatinine is 0.81. 4. Medical noncompliance.  Case manager and inpatient diabetic     coordinator met with the patient.  He indicated that he ran out of     syringes which is why he got into trouble with his blood sugar.  He     indicates that  he does have insulin at home.  Chart review     indicates the patient was seen at the [facility] on August 21.  A discussion with the family practice clinic     indicates that the patient is scheduled to come back for followup,     but was asked to complete some paperwork which he failed to do.  At     the time of discharge, the patient has been needed paperwork, it is     completed then he has been instructed to take that paperwork over     to the family practice clinic today so that his followup can be     scheduled and a plan can be developed for him to manage his     diabetes. 5. Tobacco abuse.  The patient was provided with a nicotine patch as     well as smoking cessation counseling.  PHYSICAL EXAMINATION:  VITAL SIGNS:  Temp 97.6, blood pressure 107/68, heart rate 50, respiration 16, and sats 100% on room air. GENERAL:  Up ambulating in hall gait steady.   No acute distress. CV:  Regular rate and rhythm.  No murmur, gallop, or rub.  No lower extremity edema. RESPIRATORY:  Normal effort.  Breath sounds clear to auscultation bilaterally.  No rhonchi, wheezes, or rales. ABDOMEN:  Flat, soft, positive bowel sounds, nontender to palpation. NEURO:  Alert and oriented x3.  Speech clear.  Facial symmetry.  DIET:  Carb-modified medium.  ACTIVITY:  As tolerated.  FOLLOWUP:  The patient is to go to the [facility] this afternoon to complete paperwork that is needed for his followup. He had an appointment and was seen on August 21.  At the time he meets with the family practice clinic this afternoon, they will schedule his followup appointment post discharge.  DISPOSITION:  The patient is medically stable and ready for discharge.  Time spent on this discharge, 30 minutes.     Gwenyth Bender, NP   ______________________________ Tarry Kos, MD    KMB/MEDQ  D:  11/29/2010  T:  11/29/2010  Job:  [id number]  Electronically Signed by Tarry Kos MD on 11/30/2010 03:09:45 PM Electronically Signed by Toya Smothers  on 12/05/2010 08:26:38 AM

## 2010-12-04 LAB — CULTURE, BLOOD (ROUTINE X 2)
Culture  Setup Time: 201209301703
Culture  Setup Time: 201209301703
Culture: NO GROWTH

## 2010-12-06 NOTE — H&P (Signed)
  NAMEDEMPSEY, AHONEN              ACCOUNT NO.:  [id number]  MEDICAL RECORD NO.:  [id number]  LOCATION:  WLED                         FACILITY:  [facility]  PHYSICIAN:  Chanya Chrisley, DO         DATE OF BIRTH:  1985/09/05  DATE OF ADMISSION:  11/28/2010 DATE OF DISCHARGE:                             HISTORY & PHYSICAL   ADDENDUM:  ASSESSMENT: 1. Altered mental status. 2. Tobacco abuse. 3. Medical noncompliance.          ______________________________ Fran Lowes, DO     AS/MEDQ  D:  11/28/2010  T:  11/28/2010  Job:  [id number]  Electronically Signed by Fran Lowes DO on 12/06/2010 03:46:49 PM

## 2010-12-06 NOTE — H&P (Signed)
[name], [name]              ACCOUNT NO.:  [id number]  MEDICAL RECORD NO.:  [id number]  LOCATION:  WLED                         FACILITY:  [facility]  PHYSICIAN:  Jaima Janney, DO         DATE OF BIRTH:  06/30/1985  DATE OF ADMISSION:  11/28/2010 DATE OF DISCHARGE:                             HISTORY & PHYSICAL   CHIEF COMPLAINT:  Abdominal pain, nausea, and vomiting.  HISTORY OF PRESENT ILLNESS:  The patient is 25 year old male who has had type 1 diabetes mellitus since the age of  62.  In recent years, the patient has not really had regular medical care.  He states he goes from pharmacy to pharmacy to get his insulin.  He has not had any insulin since Friday.  He states that Saturday he started having polyuria and polydipsia, and Saturday night he started having nausea and vomiting, and this morning the nausea and vomiting had continued along with abdominal pain, and he presents here in  DKA  with initial glucose greater than 600.  PAST MEDICAL HISTORY:  Significant for type 1 diabetes mellitus since age 12.  DKA on multiple occasions.  MEDICATIONS:  Include Novolin 70/30, 35 units subcu q.a.m. and q.p.m.  PAST SURGICAL HISTORY:  None.  REVIEW OF SYSTEMS:  First of all, this patient wants level III-IV caveat.  He is lethargic and somewhat uncooperative with history and exam.  CONSTITUTIONAL:  Negative for fever.  Positive for chills. Positive for weakness.  Positive for fatigue.  CNS:  No headaches.  No seizures.  No limb weakness.  ENT:  No nasal congestion.  No throat pain.  No coryza.  CARDIOVASCULAR:  No chest pain.  No palpitation.  No orthopnea.  RESPIRATORY:  No cough.  No shortness breath.  No wheezing. GASTROINTESTINAL:  Positive for abdominal pain.  Positive for nausea. Positive for vomiting.  Negative for constipation.  Negative for diarrhea.  GENITOURINARY:  No dysuria.  No hematuria.  No urinary frequency.  RENAL:  No flank pain.  No swelling.  No pruritus.   SKIN: No rashes, sores, or lesions.  HEMATOLOGICAL:  No easy bruising.  No purpura.  No clots.  LYMPHS:  No lymphadenopathy.  No painful nodes.  No specific lymph swelling.  PSYCHIATRIC:  No anxiety.  No depression.  No insomnia.  SOCIAL HISTORY:  No recreational drug use.  No alcohol.  The patient states he smokes.  He has roughly a 10-15 pack-year history.  ALLERGIES:  NKDA.  FAMILY HISTORY:  Positive for diabetes mellitus.  PHYSICAL EXAMINATION:  VITALS:  Temperature 97.9, pulse 58, respiratory rate 16, and blood pressure 112/60.  GENERAL:  The patient is a somnolent young male who is somewhat reluctant to wake up.  He can wake up and he opens his eyes when commanded to do so and answers questions when prompted to do so and he seems awake and alert.  When he does, however, he seems very reluctant to do so. EYES:  Pupils equal, round, and reactive to light and accommodation. External ocular movements are bilaterally intact.  Sclerae nonicteric, noninjected. MOUTH:  Oral mucosa is dry.  No lesions.  No sores.  Pharynx clean.  Noerythema.  No exudate. NECK:  Negative for JVD.  Negative for thyromegaly.  Negative for lymphadenopathy. HEART:  Regular rate and rhythm at 60 beats per minute without murmur, ectopy, or gallops.  No lateral PMI.  No thrills. LUNGS:  Clear to auscultation bilaterally without wheezes, rales, or rhonchi.  No increase work of breathing.  No tactile fremitus. ABDOMEN:  Soft, nontender, and nondistended.  Positive bowel sounds.  No hepatosplenomegaly.  No hernia is palpated. EXTREMITIES.  Negative for cyanosis, clubbing, or edema.  The patient has positive dorsalis pedis and pelvis and popliteal pulses bilaterally. No carotid bruits bilaterally.  NEUROLOGICAL:  Cranial nerves II through XII grossly intact.  Motor and sensory intact.  LABORATORY DATA:  No CBC was ordered on this patient, so that is pending at this time aside.  Sodium 134, potassium 4.4,  chloride 93, CO2 of 13, BUN 22, creatinine 1.16, glucose 546, and calcium 10.4.  Urinalysis positive for greater than 1000 mg of glucose.  ASSESSMENT: 1. Diabetic ketoacidosis. 2. Metabolic acidosis. 3. Acute renal failure. 4. Tobacco abuse. 5. Medical noncompliance.  PLAN: 1. Admit to step-down status due to his moderate level of acidosis and     is somewhat uncooperative and/or lethargic, demeanor.  I am unable     to determine whether this is intentional or a result of his current     illness. 2. DKA orders for IV insulin. 3. CBC. 4. IV fluids. 5. Nicotine patch and tobacco cessation counseling, and the patient is     perhaps more receptive.  I have spent 36 minutes on this critical care admission.          ______________________________ Fran Lowes, DO     AS/MEDQ  D:  11/28/2010  T:  11/28/2010  Job:  [id number]  Electronically Signed by Fran Lowes DO on 12/06/2010 03:46:44 PM

## 2010-12-13 LAB — URINALYSIS, ROUTINE W REFLEX MICROSCOPIC
Bilirubin Urine: NEGATIVE
Ketones, ur: 15 — AB
Nitrite: NEGATIVE
Protein, ur: NEGATIVE
pH: 6.5

## 2010-12-13 LAB — COMPREHENSIVE METABOLIC PANEL
ALT: 18
AST: 18
Alkaline Phosphatase: 74
CO2: 23
Calcium: 9.6
Chloride: 98
GFR calc Af Amer: >60
GFR calc non Af Amer: >60
Potassium: 5.2 — ABNORMAL HIGH
Sodium: 131 — ABNORMAL LOW

## 2010-12-13 LAB — URINE MICROSCOPIC-ADD ON

## 2010-12-19 ENCOUNTER — Emergency Department (HOSPITAL_COMMUNITY)
Admission: EM | Admit: 2010-12-19 | Discharge: 2010-12-19 | Disposition: A | Discharge status: Home / Self Care | Payer: Self-pay | Attending: Emergency Medicine | Admitting: Emergency Medicine

## 2010-12-19 DIAGNOSIS — K029 Dental caries, unspecified: Secondary | ICD-10-CM | POA: Insufficient documentation

## 2010-12-19 DIAGNOSIS — K089 Disorder of teeth and supporting structures, unspecified: Secondary | ICD-10-CM | POA: Insufficient documentation

## 2010-12-19 DIAGNOSIS — Z794 Long term (current) use of insulin: Secondary | ICD-10-CM | POA: Insufficient documentation

## 2010-12-19 DIAGNOSIS — E109 Type 1 diabetes mellitus without complications: Secondary | ICD-10-CM | POA: Insufficient documentation

## 2010-12-22 ENCOUNTER — Other Ambulatory Visit: Payer: Self-pay | Admitting: *Deleted

## 2010-12-22 MED ORDER — INSULIN ASPART PROT & ASPART (70-30 MIX) 100 UNIT/ML ~~LOC~~ SUSP: 35.0000 [IU] | Freq: Two times a day (BID) | SUBCUTANEOUS | Status: DC

## 2010-12-22 NOTE — Telephone Encounter (Signed)
Pt has gotten a prescription for the Humalog 70/30.  Pt would like to get refills on the Novalog Mix 70/30 instead.

## 2011-11-23 ENCOUNTER — Emergency Department (HOSPITAL_COMMUNITY)
Admission: EM | Admit: 2011-11-23 | Discharge: 2011-11-23 | Disposition: A | Discharge status: Home / Self Care | Payer: Self-pay | Attending: Emergency Medicine | Admitting: Emergency Medicine

## 2011-11-23 ENCOUNTER — Encounter (HOSPITAL_COMMUNITY): Payer: Self-pay

## 2011-11-23 DIAGNOSIS — E109 Type 1 diabetes mellitus without complications: Secondary | ICD-10-CM | POA: Insufficient documentation

## 2011-11-23 DIAGNOSIS — B356 Tinea cruris: Secondary | ICD-10-CM | POA: Insufficient documentation

## 2011-11-23 DIAGNOSIS — Z794 Long term (current) use of insulin: Secondary | ICD-10-CM | POA: Insufficient documentation

## 2011-11-23 DIAGNOSIS — J45909 Unspecified asthma, uncomplicated: Secondary | ICD-10-CM | POA: Insufficient documentation

## 2011-11-23 DIAGNOSIS — B353 Tinea pedis: Secondary | ICD-10-CM | POA: Insufficient documentation

## 2011-11-23 LAB — POCT I-STAT, CHEM 8
Creatinine, Ser: 1 mg/dL (ref 0.50–1.35)
HCT: 55 % — ABNORMAL HIGH (ref 39.0–52.0)
Hemoglobin: 18.7 g/dL — ABNORMAL HIGH (ref 13.0–17.0)
Potassium: 4.8 mEq/L (ref 3.5–5.1)
Sodium: 138 mEq/L (ref 135–145)

## 2011-11-23 MED ORDER — INSULIN ASPART PROT & ASPART (70-30 MIX) 100 UNIT/ML ~~LOC~~ SUSP
35.0000 [IU] | Freq: Two times a day (BID) | SUBCUTANEOUS | Status: DC
  Filled 2011-11-23: qty 3

## 2011-11-23 MED ORDER — INSULIN ASPART PROT & ASPART (70-30 MIX) 100 UNIT/ML ~~LOC~~ SUSP
35.0000 [IU] | Freq: Two times a day (BID) | SUBCUTANEOUS | Status: DC
  Administered 2011-11-23: 35 [IU] via SUBCUTANEOUS
  Filled 2011-11-23: qty 10

## 2011-11-23 MED ORDER — SODIUM CHLORIDE 0.9 % IV SOLN: 1000.0000 mL | INTRAVENOUS | Status: DC

## 2011-11-23 MED ORDER — SODIUM CHLORIDE 0.9 % IV SOLN
1000.0000 mL | Freq: Once | INTRAVENOUS | Status: AC
  Administered 2011-11-23: 1000 mL via INTRAVENOUS

## 2011-11-23 MED ORDER — MICONAZOLE NITRATE 2 % EX CREA: TOPICAL_CREAM | Freq: Two times a day (BID) | CUTANEOUS | Status: DC

## 2011-11-23 NOTE — ED Notes (Addendum)
Patient reports that he has had a rash on left groin and let foot x 3 months. Patient states he has been using neosporin and hydrocortisone cream with no relief. Patient also has an abscess to the left thigh. Patient states it has been draining a yellow/green drainage. Patient also reports chills. Patient also states that he has missed taking his insulin due to lack of funds.

## 2011-11-23 NOTE — ED Notes (Signed)
Pt states he has a darkened rash to L groin area, spider bite to L upper thigh which has been draining yellow puss, spider bite present, no drainage noted at this time. Pt also states ran out of his insulin, did not have his evening dose yesterday or his morning dose today but did have his morning dose yesterday. Pt states having upper back/neck pain 8/10. Pt in no distress at this time, sitting on side of bed, a/o x 4.

## 2011-11-23 NOTE — ED Provider Notes (Signed)
History     CSN: [id number]  Arrival date & time 11/23/11  4098   First MD Initiated Contact with Patient 11/23/11 0757      Chief Complaint  Patient presents with  . Abscess  . Rash   HPI The patient presents to emergency room with complaints of skin rashes in his groin area primarily on the left side and on his left foot primarily in between his toes that is mild in nature and ongoing for several months.  The rash is not painful. It somewhat itchy. He has tried hydrocortisone cream without relief  Patient also mentions that associated with this he has not been able to see her primary care Dr. and just ran out of his insulin medication. He denies any fevers or chills. He has not had any nausea or vomiting. Patient denies any dysuria.  Past Medical History  Diagnosis Date  . Asthma     no prior hospitalizations, intubations  . Diabetes mellitus     Type I    History reviewed. No pertinent past surgical history.  Family History  Problem Relation Age of Onset  . Thyroid disease Mother     History  Substance Use Topics  . Smoking status: Never Smoker   . Smokeless tobacco: Never Used  . Alcohol Use: Yes     twice/week      Review of Systems  All other systems reviewed and are negative.    Allergies  Review of patient's allergies indicates no known allergies.  Home Medications   Current Outpatient Rx  Name Route Sig Dispense Refill  . ALBUTEROL 90 MCG/ACT IN AERS Inhalation Inhale 2 puffs into the lungs every 4 (four) hours as needed. For shortness of breath    . HYDROCORTISONE 1 % EX CREA Topical Apply topically 2 (two) times daily as needed. To leg    . INSULIN ASPART PROT & ASPART (70-30) 100 UNIT/ML [place] SUSP Subcutaneous Inject 35 Units into the skin 2 (two) times daily with a meal. 10 mL 11  . INSULIN SYRINGE-NEEDLE U-100 31G X 5/16" 0.5 ML MISC  Use to inject insulin twice daily. 100 each 12  . NEOMYCIN-POLYMYXIN-PRAMOXINE 1 % EX CREA Topical Apply 1  application topically 2 (two) times daily as needed. To leg      BP 146/74  Pulse 70  Temp 98.7 F (37.1 C) (Oral)  Resp 16  SpO2 100%  Physical Exam  Nursing note and vitals reviewed. Constitutional: He appears well-developed and well-nourished. No distress.  HENT:  Head: Normocephalic and atraumatic.  Right Ear: External ear normal.  Left Ear: External ear normal.  Eyes: Conjunctivae normal are normal. Right eye exhibits no discharge. Left eye exhibits no discharge. No scleral icterus.  Neck: Neck supple. No tracheal deviation present.  Cardiovascular: Normal rate, regular rhythm and intact distal pulses.   Pulmonary/Chest: Effort normal and breath sounds normal. No stridor. No respiratory distress. He has no wheezes. He has no rales.  Abdominal: Soft. Bowel sounds are normal. He exhibits no distension. There is no tenderness. There is no rebound and no guarding.  Musculoskeletal: He exhibits no edema and no tenderness.  Neurological: He is alert. He has normal strength. No sensory deficit. Cranial nerve deficit:  no gross defecits noted. He exhibits normal muscle tone. He displays no seizure activity. Coordination normal.  Skin: Skin is warm and dry. Rash noted. No petechiae and no purpura noted. Rash is not vesicular. No erythema.       Hyperpigmented scaling  rash in the left inguinal region; mild lymphadenopathy noted in that area, no evidence of abscess or induration; patient also has a small healing papule approximately 3 cm in size the left anterior shin; in between the toes on his left foot is noted to have scaling dry skin consistent with tinea pedis  Psychiatric: He has a normal mood and affect.    ED Course  Procedures (including critical care time)  Labs Reviewed  GLUCOSE, CAPILLARY - Abnormal; Notable for the following:    Glucose-Capillary 427 (*)     All other components within normal limits  POCT I-STAT, CHEM 8 - Abnormal; Notable for the following:    Glucose,  Bld 411 (*)     Calcium, Ion 1.26 (*)     Hemoglobin 18.7 (*)     HCT 55.0 (*)     All other components within normal limits   No results found.   1. Diabetes mellitus type 1   2. Tinea pedis   3. Tinea cruris       MDM  Patient appears to have fungal infections in the left groin area on his foot. Does not sound like he's had good diabetic control the last several months. This is likely contributing factor. He does not appear to be evidence of cellulitis or abscess.  Blood sugar is elevated however he has no signs of DKA.  Pt had not taken his insulin this am.  He was given a dose while in the ED.  Pt has follow up with the [facility] planned but not in the next month.  I recc he call for a sooner appt.  the patient will be given a vial of insulin 7030 to take home.  I explained to the patient the importance of getting his medications filled. He will be given a prescription for miconazole to help with the tinea cruris and pedis       Celene Kras, MD 11/23/11 [phone]

## 2011-11-23 NOTE — ED Notes (Signed)
Pt given Novolog 70/30 vial to take home per EDP request, pt escorted to discharge window.

## 2011-12-14 ENCOUNTER — Other Ambulatory Visit: Payer: Self-pay | Admitting: Internal Medicine

## 2011-12-14 ENCOUNTER — Telehealth: Payer: Self-pay | Admitting: *Deleted

## 2011-12-14 DIAGNOSIS — E109 Type 1 diabetes mellitus without complications: Secondary | ICD-10-CM

## 2011-12-14 MED ORDER — INSULIN ASPART PROT & ASPART (70-30 MIX) 100 UNIT/ML ~~LOC~~ SUSP: 35.0000 [IU] | Freq: Two times a day (BID) | SUBCUTANEOUS | Status: DC

## 2011-12-14 NOTE — Telephone Encounter (Signed)
Fax from [employer] - [facility] Novolin 70/30 - inject 25 to 35 units twice daily before meals as directed.

## 2011-12-14 NOTE — Telephone Encounter (Signed)
Sent electronically to pharmacy 10/16.

## 2012-01-04 ENCOUNTER — Ambulatory Visit (INDEPENDENT_AMBULATORY_CARE_PROVIDER_SITE_OTHER): Payer: Self-pay | Admitting: Internal Medicine

## 2012-01-04 ENCOUNTER — Encounter: Payer: Self-pay | Admitting: Internal Medicine

## 2012-01-04 VITALS — BP 121/79 | HR 67 | Temp 99.2°F | Ht 74.0 in | Wt 184.4 lb

## 2012-01-04 DIAGNOSIS — E109 Type 1 diabetes mellitus without complications: Secondary | ICD-10-CM

## 2012-01-04 DIAGNOSIS — J45909 Unspecified asthma, uncomplicated: Secondary | ICD-10-CM

## 2012-01-04 DIAGNOSIS — R51 Headache: Secondary | ICD-10-CM

## 2012-01-04 DIAGNOSIS — R519 Headache, unspecified: Secondary | ICD-10-CM | POA: Insufficient documentation

## 2012-01-04 DIAGNOSIS — Z79899 Other long term (current) drug therapy: Secondary | ICD-10-CM

## 2012-01-04 LAB — COMPLETE METABOLIC PANEL WITH GFR
ALT: 11 U/L (ref 0–53)
Albumin: 4.4 g/dL (ref 3.5–5.2)
CO2: 25 mEq/L (ref 19–32)
Calcium: 10.4 mg/dL (ref 8.4–10.5)
Chloride: 94 mEq/L — ABNORMAL LOW (ref 96–112)
GFR, Est African American: >89 mL/min
Sodium: 133 mEq/L — ABNORMAL LOW (ref 135–145)
Total Protein: 7.8 g/dL (ref 6.0–8.3)

## 2012-01-04 LAB — CBC WITH DIFFERENTIAL/PLATELET
Basophils Relative: 0 % (ref 0–1)
Eosinophils Absolute: 0.3 10*3/uL (ref 0.0–0.7)
MCH: 30.8 pg (ref 26.0–34.0)
MCHC: 36.1 g/dL — ABNORMAL HIGH (ref 30.0–36.0)
Monocytes Relative: 8 % (ref 3–12)
Neutrophils Relative %: 60 % (ref 43–77)
Platelets: 270 10*3/uL (ref 150–400)

## 2012-01-04 LAB — GLUCOSE, CAPILLARY: Glucose-Capillary: 321 mg/dL — ABNORMAL HIGH (ref 70–99)

## 2012-01-04 MED ORDER — PROMETHAZINE HCL 12.5 MG PO TABS: 12.5000 mg | ORAL_TABLET | Freq: Four times a day (QID) | ORAL | Status: DC | PRN

## 2012-01-04 MED ORDER — INSULIN ASPART 100 UNIT/ML ~~LOC~~ SOLN
10.0000 [IU] | Freq: Once | SUBCUTANEOUS | Status: AC
  Administered 2012-01-04: 10 [IU] via SUBCUTANEOUS

## 2012-01-04 MED ORDER — INSULIN ASPART PROT & ASPART (70-30 MIX) 100 UNIT/ML ~~LOC~~ SUSP: SUBCUTANEOUS | Status: DC

## 2012-01-04 MED ORDER — SODIUM CHLORIDE 0.9 % IV BOLUS (SEPSIS)
1000.0000 mL | Freq: Once | INTRAVENOUS | Status: AC
  Administered 2012-01-04: 1000 mL via INTRAVENOUS

## 2012-01-04 MED ORDER — GLUCOSE BLOOD VI STRP: ORAL_STRIP | Status: DC

## 2012-01-04 NOTE — Assessment & Plan Note (Signed)
Given patient's acute hyperglycemia, we did not get a chance to discuss this at length.  Will address at follow-up visit.

## 2012-01-04 NOTE — Progress Notes (Signed)
1440PM - IV started in right forearm w/#22G angiocath; site unremarkable. NS started.

## 2012-01-04 NOTE — Assessment & Plan Note (Signed)
Patient's headaches have some features suggestive of migraines, likely triggered by hyperglycemia -glucose control as above -triptans are too expensive without insurance, will try phenergan.  Patient instructed not to drive for 4 hours after taking medication

## 2012-01-04 NOTE — Progress Notes (Signed)
1600PM - 1L of IVF has infused; IV d/c with catheter intact - site unremarkable.

## 2012-01-04 NOTE — Patient Instructions (Addendum)
For your diabetes, we are increasing your insulin.  Take Novolog 70/30, 40 units every morning before breakfast and 35 units every evening before dinner. -Checking your blood sugar at least 3 times per day is very important to avoid high or low blood sugar values.  The [employer] meter is available at the [facility], 100 strips for $12 with the [employer] -Your insulin prescription will cost $6 per month at that pharmacy, with the orange card [facility] [address] Sherian Maroon [phone]  Your headaches will likely improve with better control of your blood sugar. -in the meantime, we can prescribed phenergan.  Take 1 tablet up to every 6 hours for nausea and headache relief.  DO NOT drive or operate machinery for at least 4 hours after taking this medication, since it can be sedating.  Please return for a follow-up visit in 1 week for a diabetes follow-up.

## 2012-01-04 NOTE — Assessment & Plan Note (Addendum)
The patient has a history of DM type I, with A1C today = 12.5, and CBG = 589 after running out of insulin yesterday.  The patient presents with symptoms concerning for DKA, though fortunately the patient is able to tolerate PO. -will give 1 L NS IVF bolus -patient given large cup of water to drink -patient given novolog 10 -check stat BMET, UA  Addendum: BMET shows an anion gap of 14, bicarb of 25.  Patient unable to give urine specimen.  Patient feels somewhat better after IV fluids and insulin.  CBG fell from 589 to 321 after novolog.  Patient appears safe for discharge home at this time.  Cause of hyperglycemia is likely running out of insulin. -called in novolog 70/30 to patient's pharmacy.  Will increase dose to 40 units qAM, 35 units qPM -patient given info on orange card.  At health department pharmacy with orange card, strips cost only $12/month, and novolog 70/30 costs only $6/month.  Patient given paper prescriptions for both -recommended OTC lotrimin powder for moisture-related skin breakdown in L 4th and 5th toes -patient to return in 1 week for CBG check and to ensure he has been able to obtain orange card, medication, and test strips

## 2012-01-04 NOTE — Progress Notes (Signed)
HPI The patient is a 26 y.o. male with a history of DMI, asthma, presenting for a follow-up visit.  The patient notes blurry vision, nausea, fatigue, polyuria, and polydipsia, present for the last 1 week.  The patient is currently taking Novolin 70/30, currently using 35 units twice per day, but ran out yesterday evening.  The patient notes poor appetite and poor PO intake over the last few days.  He notes that his glucometer ran out of strips 1 month ago (one touch ultra), and that he has not been able to afford test strips since that time.  He notes only rare hypoglycemia, typically occurring after skipping meals, last occurring 1 month ago.  He notes no dysuria, cough, fevers, or abdominal pain.  He also notes severe headaches, which typically occur at night, can least up to 24 hours, starting 2-3 weeks ago, occurring only some days.  He describes the headache as a right-sided throbbing pain, with associated photophobia, phonophobia.  The headaches typically do not have nausea, but he notes 1 episode of vomiting during an episode last week.  No preceding aura.  Symptoms interfere with sleep.  He notes that his asthma has been variably well-controlled, using his albuterol inhaler 1-2 times per week.  He has tried advair in the past, but noted GI upset.  ROS: General: no fevers, chills, changes in weight Skin: no rash HEENT: no hearing changes, sore throat Pulm: no dyspnea, coughing, wheezing CV: no chest pain, palpitations, shortness of breath Abd: see HPI GU: no dysuria, hematuria Ext: no arthralgias, myalgias Neuro: no weakness, numbness, or tingling  Filed Vitals:   01/04/12 1344  BP: 121/79  Pulse: 67  Temp: 99.2 F (37.3 C)    PEX General: alert, appears mildly frightened HEENT: pupils equal round and reactive to light, vision grossly intact, oropharynx clear and non-erythematous  Neck: supple, no lymphadenopathy Lungs: clear to ascultation bilaterally, normal work of  respiration, no wheezes, rales, ronchi Heart: regular rate and rhythm, no murmurs, gallops, or rubs Abdomen: soft, non-tender, non-distended, normal bowel sounds Extremities: 2+ DP/PT pulses bilaterally, no cyanosis, clubbing, or edema, small area of linear skin breakdown under left 4th and 5th toes Neurologic: alert & oriented X3, cranial nerves II-XII intact, strength grossly intact, sensation intact to light touch  Current Outpatient Prescriptions on File Prior to Visit  Medication Sig Dispense Refill  . albuterol (PROVENTIL,VENTOLIN) 90 MCG/ACT inhaler Inhale 2 puffs into the lungs every 4 (four) hours as needed. For shortness of breath      . hydrocortisone cream 1 % Apply topically 2 (two) times daily as needed. To leg      . insulin aspart protamine-insulin aspart (NOVOLOG 70/30) (70-30) 100 UNIT/ML injection Inject 35 Units into the skin 2 (two) times daily with a meal.  10 mL  11  . Insulin Syringe-Needle U-100 31G X 5/16" 0.5 ML MISC Use to inject insulin twice daily.  100 each  12  . miconazole (MICOTIN) 2 % cream Apply topically 2 (two) times daily.  28.35 g  1  . neomycin-polymyxin-pramoxine (NEOSPORIN PLUS) 1 % cream Apply 1 application topically 2 (two) times daily as needed. To leg        Assessment/Plan

## 2012-01-05 LAB — LIPID PANEL
Cholesterol: 147 mg/dL (ref 0–200)
Triglycerides: 164 mg/dL — ABNORMAL HIGH (ref <150)

## 2012-01-11 ENCOUNTER — Ambulatory Visit: Payer: Self-pay | Admitting: Internal Medicine

## 2012-03-17 ENCOUNTER — Other Ambulatory Visit: Payer: Self-pay | Admitting: Internal Medicine

## 2012-03-17 MED ORDER — ALBUTEROL SULFATE HFA 108 (90 BASE) MCG/ACT IN AERS: 2.0000 | INHALATION_SPRAY | RESPIRATORY_TRACT | Status: DC | PRN

## 2012-03-20 ENCOUNTER — Encounter: Payer: Self-pay | Admitting: Internal Medicine

## 2012-04-13 ENCOUNTER — Encounter (HOSPITAL_COMMUNITY): Payer: Self-pay | Admitting: Emergency Medicine

## 2012-04-13 ENCOUNTER — Emergency Department (HOSPITAL_COMMUNITY)
Admission: EM | Admit: 2012-04-13 | Discharge: 2012-04-13 | Disposition: A | Discharge status: Home / Self Care | Payer: Self-pay | Attending: Emergency Medicine | Admitting: Emergency Medicine

## 2012-04-13 DIAGNOSIS — R21 Rash and other nonspecific skin eruption: Secondary | ICD-10-CM

## 2012-04-13 DIAGNOSIS — E109 Type 1 diabetes mellitus without complications: Secondary | ICD-10-CM | POA: Insufficient documentation

## 2012-04-13 DIAGNOSIS — M79673 Pain in unspecified foot: Secondary | ICD-10-CM

## 2012-04-13 DIAGNOSIS — Z8619 Personal history of other infectious and parasitic diseases: Secondary | ICD-10-CM | POA: Insufficient documentation

## 2012-04-13 DIAGNOSIS — M79609 Pain in unspecified limb: Secondary | ICD-10-CM | POA: Insufficient documentation

## 2012-04-13 DIAGNOSIS — J45909 Unspecified asthma, uncomplicated: Secondary | ICD-10-CM | POA: Insufficient documentation

## 2012-04-13 DIAGNOSIS — Z794 Long term (current) use of insulin: Secondary | ICD-10-CM | POA: Insufficient documentation

## 2012-04-13 MED ORDER — PREDNISONE 20 MG PO TABS: ORAL_TABLET | ORAL | Status: DC

## 2012-04-13 MED ORDER — HYDROCODONE-ACETAMINOPHEN 5-325 MG PO TABS: ORAL_TABLET | ORAL | Status: DC

## 2012-04-13 NOTE — ED Notes (Signed)
Onset 3-4 days left foot pain worsening overtime at rest pain 2/10 achy and while ambulating pain 8/10 achy throbbing shooting.

## 2012-04-13 NOTE — ED Provider Notes (Signed)
History    This chart was scribed for non-physician practitioner working with Carleene Cooper III, MD by Frederik Pear, ED Scribe. This patient was seen in room TR11C/TR11C and the patient's care was started at 1637.   CSN: [id number]  Arrival date & time 04/13/12  1553   First MD Initiated Contact with Patient 04/13/12 1637      Chief Complaint  Patient presents with  . Foot Pain    (Consider location/radiation/quality/duration/timing/severity/associated sxs/prior treatment) The history is provided by the patient. No language interpreter was used.    [name] is a 27 y.o. male who presents to the Emergency Department complaining of sudden onset, gradually worsening, non-radiating left foot pain with an associated rash that is improved by relaxing him foot and aggravated by arching his foot that began 1 week ago. He denies any h/o of similar symptoms and reports that he had varicella virus as a child. He denies any rashes or lesions to any other area of his body, fever, or drainage from his foot. He reports that he treated the symptoms with alkaseltzer with no relief. No significant prodrome. No new skin exposures.  Past Medical History  Diagnosis Date  . Asthma     no prior hospitalizations, intubations  . Diabetes mellitus     Type I    History reviewed. No pertinent past surgical history.  Family History  Problem Relation Age of Onset  . Thyroid disease Mother     History  Substance Use Topics  . Smoking status: Never Smoker   . Smokeless tobacco: Never Used  . Alcohol Use: No     Comment: twice/week      Review of Systems  Constitutional: Negative for fever.  Musculoskeletal:       Foot pain.  Skin: Positive for rash.    Allergies  Shellfish allergy  Home Medications   Current Outpatient Rx  Name  Route  Sig  Dispense  Refill  . insulin aspart protamine-insulin aspart (NOVOLOG 70/30) (70-30) 100 UNIT/ML injection   Subcutaneous   Inject 35-40 Units  into the skin 2 (two) times daily. Injects 35 units in the morning and 40 units in the evening           BP 144/72  Pulse 76  Temp(Src) 98.5 F (36.9 C) (Oral)  Resp 18  SpO2 98%  Physical Exam  Nursing note and vitals reviewed. Constitutional: He appears well-developed and well-nourished.  HENT:  Head: Normocephalic and atraumatic.  Eyes: Conjunctivae are normal.  Neck: Normal range of motion. Neck supple.  Cardiovascular: Normal pulses.   Pulses:      Dorsalis pedis pulses are 2+ on the right side, and 2+ on the left side.       Posterior tibial pulses are 2+ on the right side, and 2+ on the left side.  Pulmonary/Chest: Effort normal. No respiratory distress.  Abdominal: There is no tenderness.  Musculoskeletal: Normal range of motion. He exhibits tenderness. He exhibits no edema.  Neurological: He is alert. No sensory deficit.  Motor, sensation, and vascular distal to the injury is fully intact.   Skin: Skin is warm and dry. Rash noted.  There are scattered vesicles over the medial aspect of his great toe and forefoot that are not draining. There are no other lesions present on his other hand or either foot. No redness or signs of cellulitis.  Psychiatric: He has a normal mood and affect. Thought content normal.    ED Course  Procedures (including critical  care time)  DIAGNOSTIC STUDIES: Oxygen Saturation is 98% on room air, normal by my interpretation.    COORDINATION OF CARE:  17:05- Discussed planned course of treatment with the patient, including Deltasone, Norco, and following up with his PCP if symptoms do not resolve after the weekend, who is agreeable at this time.   Labs Reviewed - No data to display No results found.   1. Foot pain   2. Rash    Patient seen and examined.    Vital signs reviewed and are as follows: Filed Vitals:   04/13/12 1619  BP: 144/72  Pulse: 76  Temp: 98.5 F (36.9 C)  Resp: 18   Patient will followup with primary care  physician if symptoms do not improve. Urged to return with worsening pain, swelling, redness, fever, or if he has any other concerns. Patient verbalizes understanding and agrees with the plan.   Patient told that he could use topical steroids as well as he desires.   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 No cellulitis or erythema to suspect infection. Will treat with steroids and pain medication. If steroids do not help, patient is to follow up with primary care physician. Do not suspect syphilis, shingles.   I personally performed the services described in this documentation, which was scribed in my presence. The recorded information has been reviewed and is accurate.        Renne Crigler, [place] 04/13/12 1901

## 2012-04-16 NOTE — ED Provider Notes (Signed)
Medical screening examination/treatment/procedure(s) were performed by non-physician practitioner and as supervising physician I was immediately available for consultation/collaboration.   Carleene Cooper III, MD 04/16/12 [phone]

## 2012-06-30 ENCOUNTER — Encounter (HOSPITAL_COMMUNITY): Payer: Self-pay | Admitting: Physical Medicine and Rehabilitation

## 2012-06-30 ENCOUNTER — Emergency Department (HOSPITAL_COMMUNITY)
Admission: EM | Admit: 2012-06-30 | Discharge: 2012-06-30 | Disposition: A | Discharge status: Home / Self Care | Payer: Self-pay | Attending: Emergency Medicine | Admitting: Emergency Medicine

## 2012-06-30 DIAGNOSIS — Z23 Encounter for immunization: Secondary | ICD-10-CM | POA: Insufficient documentation

## 2012-06-30 DIAGNOSIS — E109 Type 1 diabetes mellitus without complications: Secondary | ICD-10-CM | POA: Insufficient documentation

## 2012-06-30 DIAGNOSIS — T23072A Burn of unspecified degree of left wrist, initial encounter: Secondary | ICD-10-CM

## 2012-06-30 DIAGNOSIS — L089 Local infection of the skin and subcutaneous tissue, unspecified: Secondary | ICD-10-CM | POA: Insufficient documentation

## 2012-06-30 DIAGNOSIS — T310 Burns involving less than 10% of body surface: Secondary | ICD-10-CM | POA: Insufficient documentation

## 2012-06-30 DIAGNOSIS — Z794 Long term (current) use of insulin: Secondary | ICD-10-CM | POA: Insufficient documentation

## 2012-06-30 DIAGNOSIS — T23279A Burn of second degree of unspecified wrist, initial encounter: Secondary | ICD-10-CM | POA: Insufficient documentation

## 2012-06-30 DIAGNOSIS — R112 Nausea with vomiting, unspecified: Secondary | ICD-10-CM | POA: Insufficient documentation

## 2012-06-30 DIAGNOSIS — T148XXA Other injury of unspecified body region, initial encounter: Secondary | ICD-10-CM | POA: Insufficient documentation

## 2012-06-30 DIAGNOSIS — R197 Diarrhea, unspecified: Secondary | ICD-10-CM | POA: Insufficient documentation

## 2012-06-30 DIAGNOSIS — X12XXXA Contact with other hot fluids, initial encounter: Secondary | ICD-10-CM | POA: Insufficient documentation

## 2012-06-30 DIAGNOSIS — T798XXA Other early complications of trauma, initial encounter: Secondary | ICD-10-CM

## 2012-06-30 DIAGNOSIS — X131XXA Other contact with steam and other hot vapors, initial encounter: Secondary | ICD-10-CM | POA: Insufficient documentation

## 2012-06-30 DIAGNOSIS — Y93G3 Activity, cooking and baking: Secondary | ICD-10-CM | POA: Insufficient documentation

## 2012-06-30 DIAGNOSIS — J45909 Unspecified asthma, uncomplicated: Secondary | ICD-10-CM | POA: Insufficient documentation

## 2012-06-30 DIAGNOSIS — Y929 Unspecified place or not applicable: Secondary | ICD-10-CM | POA: Insufficient documentation

## 2012-06-30 MED ORDER — MUPIROCIN CALCIUM 2 % EX CREA: TOPICAL_CREAM | Freq: Three times a day (TID) | CUTANEOUS | Status: DC

## 2012-06-30 MED ORDER — TETANUS-DIPHTH-ACELL PERTUSSIS 5-2.5-18.5 LF-MCG/0.5 IM SUSP
0.5000 mL | Freq: Once | INTRAMUSCULAR | Status: AC
  Administered 2012-06-30: 0.5 mL via INTRAMUSCULAR
  Filled 2012-06-30: qty 0.5

## 2012-06-30 MED ORDER — HYDROCODONE-ACETAMINOPHEN 5-325 MG PO TABS: 1.0000 | ORAL_TABLET | ORAL | Status: DC | PRN
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
## 2012-07-01 NOTE — ED Provider Notes (Signed)
Medical screening examination/treatment/procedure(s) were performed by non-physician practitioner and as supervising physician I was immediately available for consultation/collaboration.   Gurpreet Mikhail, MD 07/01/12 1540 

## 2012-08-08 ENCOUNTER — Encounter: Payer: Self-pay | Admitting: Dietician

## 2012-09-06 ENCOUNTER — Other Ambulatory Visit: Payer: Self-pay

## 2012-10-26 ENCOUNTER — Encounter (HOSPITAL_COMMUNITY): Payer: Self-pay | Admitting: Cardiology

## 2012-10-26 ENCOUNTER — Emergency Department (HOSPITAL_COMMUNITY)
Admission: EM | Admit: 2012-10-26 | Discharge: 2012-10-26 | Disposition: A | Discharge status: Home / Self Care | Payer: Self-pay | Attending: Emergency Medicine | Admitting: Emergency Medicine

## 2012-10-26 DIAGNOSIS — Z794 Long term (current) use of insulin: Secondary | ICD-10-CM | POA: Insufficient documentation

## 2012-10-26 DIAGNOSIS — R1110 Vomiting, unspecified: Secondary | ICD-10-CM | POA: Insufficient documentation

## 2012-10-26 DIAGNOSIS — J45909 Unspecified asthma, uncomplicated: Secondary | ICD-10-CM | POA: Insufficient documentation

## 2012-10-26 DIAGNOSIS — R739 Hyperglycemia, unspecified: Secondary | ICD-10-CM | POA: Diagnosis present

## 2012-10-26 DIAGNOSIS — E109 Type 1 diabetes mellitus without complications: Secondary | ICD-10-CM | POA: Insufficient documentation

## 2012-10-26 LAB — GLUCOSE, CAPILLARY: Glucose-Capillary: 236 mg/dL — ABNORMAL HIGH (ref 70–99)

## 2012-10-26 MED ORDER — SODIUM CHLORIDE 0.9 % IV BOLUS (SEPSIS): 1000.0000 mL | INTRAVENOUS | Status: DC

## 2012-10-26 MED ORDER — INSULIN ASPART PROT & ASPART (70-30 MIX) 100 UNIT/ML ~~LOC~~ SUSP
35.0000 [IU] | Freq: Once | SUBCUTANEOUS | Status: AC
  Administered 2012-10-26: 35 [IU] via SUBCUTANEOUS
  Filled 2012-10-26: qty 10

## 2012-10-26 NOTE — ED Notes (Signed)
Pt reports that he has been having a hard time controlling his blood sugars. States that they have been really high and then dropping. States that he has been out of his insulin for the past week and having a hard time getting the medication. States that he passed out last night.

## 2012-10-26 NOTE — ED Notes (Signed)
Pt given given snack with insulin.  Pt tearful about the fact that he has diabetes.  Discussed with pt at length the need to accept the dx and the fact that he can live a fulfilled life even though he has this difficult disease.  Pt states states he does have a cousin that is very supportive.

## 2012-10-26 NOTE — ED Provider Notes (Signed)
CSN: [id number]     Arrival date & time 10/26/12  1229 History   First MD Initiated Contact with Patient 10/26/12 1242     Chief Complaint  Patient presents with  . Hyperglycemia   (Consider location/radiation/quality/duration/timing/severity/associated sxs/prior Treatment) Patient is a 27 y.o. male presenting with hyperglycemia. The history is provided by the patient.  Hyperglycemia Severity:  Mild Onset quality:  Gradual Duration:  6 days Timing:  Constant Progression:  Unchanged Chronicity:  New Diabetes status:  Controlled with insulin Current diabetic therapy:  Novolog 70/30 Time since last antidiabetic medication:  6 days Context: noncompliance   Relieved by:  Nothing Ineffective treatments:  None tried Associated symptoms: vomiting   Associated symptoms: no abdominal pain, no chest pain, no dysuria, no fever, no nausea and no shortness of breath     Past Medical History  Diagnosis Date  . Asthma     no prior hospitalizations, intubations  . Diabetes mellitus     Type I   History reviewed. No pertinent past surgical history. Family History  Problem Relation Age of Onset  . Thyroid disease Mother    History  Substance Use Topics  . Smoking status: Never Smoker   . Smokeless tobacco: Never Used  . Alcohol Use: Yes     Comment: twice/week    Review of Systems  Constitutional: Negative for fever.  HENT: Negative for rhinorrhea, drooling and neck pain.   Eyes: Negative for pain.  Respiratory: Negative for cough and shortness of breath.   Cardiovascular: Negative for chest pain and leg swelling.  Gastrointestinal: Positive for vomiting. Negative for nausea, abdominal pain and diarrhea.  Genitourinary: Negative for dysuria and hematuria.  Musculoskeletal: Negative for gait problem.  Skin: Negative for color change.  Neurological: Negative for numbness and headaches.  Hematological: Negative for adenopathy.  Psychiatric/Behavioral: Negative for behavioral  problems.  All other systems reviewed and are negative.    Allergies  Shellfish allergy  Home Medications   Current Outpatient Rx  Name  Route  Sig  Dispense  Refill  . HYDROcodone-acetaminophen (NORCO/VICODIN) 5-325 MG per tablet   Oral   Take 1 tablet by mouth every 4 (four) hours as needed for pain.   15 tablet   0   . insulin aspart protamine-insulin aspart (NOVOLOG 70/30) (70-30) 100 UNIT/ML injection   Subcutaneous   Inject 35-45 Units into the skin 2 (two) times daily. Injects 45 units in the morning and 35 units in the evening         . mupirocin cream (BACTROBAN) 2 %   Topical   Apply topically 3 (three) times daily. X 10 days   15 g   0    BP 150/106  Pulse 74  Temp(Src) 98.3 F (36.8 C) (Oral)  Resp 18  SpO2 98% Physical Exam  Nursing note and vitals reviewed. Constitutional: He is oriented to person, place, and time. He appears well-developed and well-nourished.  HENT:  Head: Normocephalic and atraumatic.  Right Ear: External ear normal.  Left Ear: External ear normal.  Nose: Nose normal.  Mouth/Throat: Oropharynx is clear and moist. No oropharyngeal exudate.  Eyes: Conjunctivae and EOM are normal. Pupils are equal, round, and reactive to light.  Neck: Normal range of motion. Neck supple.  Cardiovascular: Normal rate, regular rhythm, normal heart sounds and intact distal pulses.  Exam reveals no gallop and no friction rub.   No murmur heard. Pulmonary/Chest: Effort normal and breath sounds normal. No respiratory distress. He has no wheezes.  Abdominal: Soft. Bowel sounds are normal. He exhibits no distension. There is no tenderness. There is no rebound and no guarding.  Musculoskeletal: Normal range of motion. He exhibits no edema and no tenderness.  Neurological: He is alert and oriented to person, place, and time.  Skin: Skin is warm and dry.  Psychiatric: He has a normal mood and affect. His behavior is normal.    ED Course  Procedures  (including critical care time) Labs Review Labs Reviewed  GLUCOSE, CAPILLARY - Abnormal; Notable for the following:    Glucose-Capillary 282 (*)    All other components within normal limits  CBC WITH DIFFERENTIAL  BASIC METABOLIC PANEL  URINALYSIS, ROUTINE W REFLEX MICROSCOPIC   Imaging Review No results found.   Date: 10/26/2012  Rate: 71  Rhythm: normal sinus rhythm  QRS Axis: normal  Intervals: normal  ST/T Wave abnormalities: normal  Conduction Disutrbances:none  Narrative Interpretation: Early repolarization  Old EKG Reviewed: unchanged   MDM   1. Hyperglycemia    12:55 PM 27 y.o. male who pw hyperglycemia (280). Pt has not been able to get his insulin x 6 days d/t financial issues. Several episodes of syncope/presyncope this week which he relates to difficulty controlling his BS. Pt AFVSS here, appears well on exam. Will get labs, IVF, social work consult.   Pt refusing labs, IVF. Just wants to talk to SW about his insulin.   Will give a dose of pt's home insulin here.   4:10 PM: I informed pt that I would like to get labs to evaluate his syncope and IVF for likely mild dehydration w/ vomiting and elevated BS. He is of sound mind, has decision making capacity, he continues to refuse. I have discussed the diagnosis/risks/treatment options with the patient and believe the pt to be eligible for discharge home to follow-up with his pcp as needed. We also discussed returning to the ED immediately if new or worsening sx occur. We discussed the sx which are most concerning (e.g., continued vomiting, further syncope, uncontrolled BS's) that necessitate immediate return. Any new prescriptions provided to the patient are listed below.  Discharge Medication List as of 10/26/2012  2:20 PM         Junius Argyle, MD 10/26/12 [phone]

## 2012-10-26 NOTE — Progress Notes (Signed)
ED CM received call from [place]  in regards to patient needing medication assistance. Pt presented to ED today with. elevated blood sugar, pt states, he has not taken his insulin in 6 days due to financial reasons. Pt  Is unemployed at this time. Provided him with the [facility] [employer].  Medication not on [employer] free list,or [employer] $4 list.  Pt eligible for the [facility] program. Explained the [facility] program and the guidelines, pt verbalized understanding and agreed. Pt enrolled match letter given. Provided patient with verbal and written about [facility], [facility] Card, and Medication assistance at the Department of Health. No further CM needs identified.

## 2012-10-26 NOTE — ED Notes (Signed)
Pt refusing EKG, Lawson Fiscal RN talking to Dr.

## 2012-11-28 ENCOUNTER — Ambulatory Visit: Payer: Self-pay

## 2012-11-28 ENCOUNTER — Telehealth: Payer: Self-pay | Admitting: General Practice

## 2012-11-28 NOTE — Telephone Encounter (Signed)
Called pt to reschedule appt, both listed phone numbers are invalid.

## 2012-11-30 ENCOUNTER — Inpatient Hospital Stay (HOSPITAL_COMMUNITY)
Admission: EM | Admit: 2012-11-30 | Discharge: 2012-12-02 | DRG: 639 | Disposition: A | Discharge status: Home / Self Care | Payer: Self-pay | Attending: Internal Medicine | Admitting: Internal Medicine

## 2012-11-30 ENCOUNTER — Encounter (HOSPITAL_COMMUNITY): Payer: Self-pay

## 2012-11-30 ENCOUNTER — Emergency Department (HOSPITAL_COMMUNITY): Payer: Self-pay

## 2012-11-30 DIAGNOSIS — Z5900 Homelessness unspecified: Secondary | ICD-10-CM

## 2012-11-30 DIAGNOSIS — E1010 Type 1 diabetes mellitus with ketoacidosis without coma: Principal | ICD-10-CM | POA: Diagnosis present

## 2012-11-30 DIAGNOSIS — E109 Type 1 diabetes mellitus without complications: Secondary | ICD-10-CM

## 2012-11-30 DIAGNOSIS — Z23 Encounter for immunization: Secondary | ICD-10-CM

## 2012-11-30 DIAGNOSIS — R079 Chest pain, unspecified: Secondary | ICD-10-CM | POA: Diagnosis present

## 2012-11-30 DIAGNOSIS — J45909 Unspecified asthma, uncomplicated: Secondary | ICD-10-CM | POA: Diagnosis present

## 2012-11-30 DIAGNOSIS — E1110 Type 2 diabetes mellitus with ketoacidosis without coma: Secondary | ICD-10-CM

## 2012-11-30 DIAGNOSIS — E875 Hyperkalemia: Secondary | ICD-10-CM | POA: Diagnosis present

## 2012-11-30 DIAGNOSIS — Z794 Long term (current) use of insulin: Secondary | ICD-10-CM

## 2012-11-30 DIAGNOSIS — Z91199 Patient's noncompliance with other medical treatment and regimen due to unspecified reason: Secondary | ICD-10-CM

## 2012-11-30 DIAGNOSIS — Z9119 Patient's noncompliance with other medical treatment and regimen: Secondary | ICD-10-CM

## 2012-11-30 DIAGNOSIS — J029 Acute pharyngitis, unspecified: Secondary | ICD-10-CM | POA: Diagnosis not present

## 2012-11-30 LAB — CBC WITH DIFFERENTIAL/PLATELET
Basophils Relative: 0 % (ref 0–1)
Eosinophils Absolute: 0 10*3/uL (ref 0.0–0.7)
HCT: 48.3 % (ref 39.0–52.0)
Hemoglobin: 16.5 g/dL (ref 13.0–17.0)
Lymphs Abs: 2.6 10*3/uL (ref 0.7–4.0)
MCH: 30 pg (ref 26.0–34.0)
MCHC: 34.2 g/dL (ref 30.0–36.0)
Monocytes Absolute: 0.4 10*3/uL (ref 0.1–1.0)
Neutro Abs: 10.7 10*3/uL — ABNORMAL HIGH (ref 1.7–7.7)

## 2012-11-30 LAB — BLOOD GAS, ARTERIAL
Acid-base deficit: 29.4 mmol/L — ABNORMAL HIGH (ref 0.0–2.0)
Bicarbonate: 8.1 mEq/L — ABNORMAL LOW (ref 20.0–24.0)
Drawn by: 211791
FIO2: 0.21 %
FIO2: 0.21 %
O2 Saturation: 96.5 %
Patient temperature: 98.6
TCO2: 8.7 mmol/L (ref 0–100)
pCO2 arterial: 11.5 mmHg — CL (ref 35.0–45.0)
pCO2 arterial: 21.7 mmHg — ABNORMAL LOW (ref 35.0–45.0)
pH, Arterial: 7.028 — CL (ref 7.350–7.450)
pH, Arterial: 7.195 — CL (ref 7.350–7.450)
pO2, Arterial: 116 mmHg — ABNORMAL HIGH (ref 80.0–100.0)

## 2012-11-30 LAB — BASIC METABOLIC PANEL
BUN: 17 mg/dL (ref 6–23)
BUN: 10 mg/dL (ref 6–23)
CO2: 10 mEq/L — CL (ref 19–32)
CO2: 12 mEq/L — ABNORMAL LOW (ref 19–32)
Chloride: 105 mEq/L (ref 96–112)
Chloride: 109 mEq/L (ref 96–112)
Creatinine, Ser: 0.95 mg/dL (ref 0.50–1.35)
GFR calc Af Amer: >90 mL/min (ref >90)
GFR calc Af Amer: >90 mL/min (ref >90)
GFR calc non Af Amer: >90 mL/min (ref >90)
Glucose, Bld: 356 mg/dL — ABNORMAL HIGH (ref 70–99)
Glucose, Bld: 207 mg/dL — ABNORMAL HIGH (ref 70–99)
Potassium: 4.6 mEq/L (ref 3.5–5.1)
Potassium: 4.5 mEq/L (ref 3.5–5.1)
Sodium: 135 mEq/L (ref 135–145)
Sodium: 138 mEq/L (ref 135–145)

## 2012-11-30 LAB — CBC
HCT: 42.3 % (ref 39.0–52.0)
Hemoglobin: 15.1 g/dL (ref 13.0–17.0)
MCV: 86 fL (ref 78.0–100.0)
RBC: 4.92 MIL/uL (ref 4.22–5.81)
RDW: 13.5 % (ref 11.5–15.5)
WBC: 15.3 10*3/uL — ABNORMAL HIGH (ref 4.0–10.5)

## 2012-11-30 LAB — GLUCOSE, CAPILLARY
Glucose-Capillary: 364 mg/dL — ABNORMAL HIGH (ref 70–99)
Glucose-Capillary: 377 mg/dL — ABNORMAL HIGH (ref 70–99)
Glucose-Capillary: 203 mg/dL — ABNORMAL HIGH (ref 70–99)
Glucose-Capillary: 200 mg/dL — ABNORMAL HIGH (ref 70–99)
Glucose-Capillary: 153 mg/dL — ABNORMAL HIGH (ref 70–99)
Glucose-Capillary: 168 mg/dL — ABNORMAL HIGH (ref 70–99)
Glucose-Capillary: 138 mg/dL — ABNORMAL HIGH (ref 70–99)
Glucose-Capillary: 129 mg/dL — ABNORMAL HIGH (ref 70–99)

## 2012-11-30 LAB — MRSA PCR SCREENING: MRSA by PCR: NEGATIVE

## 2012-11-30 LAB — COMPREHENSIVE METABOLIC PANEL
CO2: <7 mEq/L — CL (ref 19–32)
Calcium: 9.3 mg/dL (ref 8.4–10.5)
Creatinine, Ser: 0.99 mg/dL (ref 0.50–1.35)
GFR calc Af Amer: >90 mL/min (ref >90)
GFR calc non Af Amer: >90 mL/min (ref >90)
Glucose, Bld: 395 mg/dL — ABNORMAL HIGH (ref 70–99)

## 2012-11-30 LAB — KETONES, QUALITATIVE

## 2012-11-30 LAB — TROPONIN I: Troponin I: <0.3 ng/mL (ref <0.30)

## 2012-11-30 MED ORDER — CALCIUM GLUCONATE 10 % IV SOLN
1.0000 g | Freq: Once | INTRAVENOUS | Status: AC
  Administered 2012-11-30: 1 g via INTRAVENOUS
  Filled 2012-11-30: qty 10

## 2012-11-30 MED ORDER — SODIUM CHLORIDE 0.9 % IV SOLN
INTRAVENOUS | Status: DC
  Administered 2012-11-30: 3 [IU]/h via INTRAVENOUS
  Filled 2012-11-30: qty 1

## 2012-11-30 MED ORDER — SODIUM CHLORIDE 0.9 % IV SOLN
INTRAVENOUS | Status: DC
  Administered 2012-11-30: 1.3 [IU]/h via INTRAVENOUS
  Administered 2012-11-30: 3.9 [IU]/h via INTRAVENOUS
  Administered 2012-12-01 (×4): 1 [IU]/h via INTRAVENOUS
  Filled 2012-11-30: qty 1

## 2012-11-30 MED ORDER — DEXTROSE 50 % IV SOLN: 25.0000 mL | INTRAVENOUS | Status: DC | PRN

## 2012-11-30 MED ORDER — ENOXAPARIN SODIUM 40 MG/0.4ML ~~LOC~~ SOLN
40.0000 mg | SUBCUTANEOUS | Status: DC
  Administered 2012-11-30 – 2012-12-01 (×2): 40 mg via SUBCUTANEOUS
  Filled 2012-11-30 (×3): qty 0.4

## 2012-11-30 MED ORDER — NALOXONE HCL 0.4 MG/ML IJ SOLN
0.4000 mg | INTRAMUSCULAR | Status: DC | PRN
  Filled 2012-11-30: qty 1

## 2012-11-30 MED ORDER — MORPHINE SULFATE 4 MG/ML IJ SOLN
4.0000 mg | Freq: Once | INTRAMUSCULAR | Status: AC
  Administered 2012-11-30: 4 mg via INTRAVENOUS
  Filled 2012-11-30: qty 1

## 2012-11-30 MED ORDER — SODIUM CHLORIDE 0.9 % IV BOLUS (SEPSIS)
1000.0000 mL | Freq: Once | INTRAVENOUS | Status: AC
  Administered 2012-11-30: 1000 mL via INTRAVENOUS

## 2012-11-30 MED ORDER — KETOROLAC TROMETHAMINE 15 MG/ML IJ SOLN
15.0000 mg | Freq: Once | INTRAMUSCULAR | Status: AC
  Administered 2012-12-01: 15 mg via INTRAVENOUS
  Filled 2012-11-30: qty 1

## 2012-11-30 MED ORDER — DEXTROSE-NACL 5-0.45 % IV SOLN
INTRAVENOUS | Status: DC
  Administered 2012-11-30 – 2012-12-01 (×3): via INTRAVENOUS

## 2012-11-30 MED ORDER — SODIUM CHLORIDE 0.9 % IV SOLN: INTRAVENOUS | Status: DC

## 2012-11-30 MED ORDER — CEPHALEXIN 500 MG PO CAPS: 500.0000 mg | ORAL_CAPSULE | Freq: Four times a day (QID) | ORAL | Status: DC

## 2012-11-30 MED ORDER — SODIUM CHLORIDE 0.9 % IV SOLN
1000.0000 mL | INTRAVENOUS | Status: DC
  Administered 2012-11-30 (×2): 1000 mL via INTRAVENOUS

## 2012-11-30 MED ORDER — ONDANSETRON HCL 4 MG/2ML IJ SOLN
4.0000 mg | Freq: Once | INTRAMUSCULAR | Status: AC
  Administered 2012-11-30: 4 mg via INTRAVENOUS
  Filled 2012-11-30: qty 2

## 2012-11-30 MED ORDER — DEXTROSE-NACL 5-0.45 % IV SOLN
INTRAVENOUS | Status: DC
  Administered 2012-11-30: 11:00:00 via INTRAVENOUS

## 2012-11-30 NOTE — ED Notes (Signed)
Patient verbalized agreement to transfer to [facility].

## 2012-11-30 NOTE — Progress Notes (Signed)
pcp is [facility] internal medicine

## 2012-11-30 NOTE — ED Notes (Signed)
Bed: ZO10 Expected date:  Expected time:  Means of arrival:  Comments: EMS-hyperglycemic

## 2012-11-30 NOTE — ED Notes (Signed)
Per EMS Patient started feeling bad yesterday. Having chest pain, sharp pain 8/10, from epigastric area. Has not had insulin in  3days. States that he does not have a PCP. Normally comes to hospital to get prescriptions for insulin. cbg 366 for EMS. 150/96, 88, 99% on RA

## 2012-11-30 NOTE — Significant Event (Addendum)
Patient seen at the request of Dr. Jodelle Green of the emergency room at [facility].   Briefly, patient ran out of insulin 7030 on Wednesday 11/28/12 and ever since then has been having some abdominal/chest pain. I saw the patient initially at around 1040 a.m. 11/30/2012 and he was somnolent but arousable and able to interact with me. He states that he usually takes 35 units at 70/30 in the morning and 45 units at night. He is not been able to afford his insulin and hence ran out on Wednesday. Please note that patient has been admitted in the remote past for severe DKA at [facility] and is in internal medicine teaching service patient A repeat blood gas which was arterial showed it pH of 7.0. His calculated anion gap is 32 Patient was re\re assessed at around 11:30 AM this morning and was a lot more somnolent but had just been given morphine 4 mg IV push which would total a dose of 12 mg of morphine for chest and abdominal pain. EKG shows rate related changes with sinus tachycardia and mild repeat T waves. His potassium was 5.2 and he was given calcium gluconate. He has no renal insufficiency and actually his a little hypertensive.  On exam-ill-appearing but conversant African American male, no pallor no icterus  no LAN, throat is clear Chest clinically clear S1-S2 no murmur rub or gallop Abdomen soft, no rebound no guarding no peritoneal signs Lower extremities is soft nontender with no swelling Neurological he somnolent but intact  a/p Somnolent secondary to multiple reasons-acidotic with metabolic acidosis secondary to DKA + morphine Patient already on DKA stabilizer, in the process of receiving third liter of fluid I have asked critical care of to see patient-if the patient does not wake up he'll need be admitted at [facility] long hospital otherwise should be stable for transfer in the next couple of hours per the discretion of Dr. Micheline Maze EDP and CCM I discussed with Dr. Kem Kays the case  and he agrees that it is stable for transfer he can be transferred however this was prior to events of 11:30 this morning.   Please see further events and PCCM NP note from Ms Veleta Miners.  I did [facility] IMTS resident to give him an update [no call back as yet] and do feel patient is stable for Tx to [facility] for further management  Thanks for this consult.  Pleas Koch, MD [facility] [phone]

## 2012-11-30 NOTE — H&P (Signed)
  I have seen and examined the patient myself, and I have reviewed the note by Geronimo Boot, MS 4 and was present during the interview and physical exam.  Please see my separate H&P for additional findings, assessment, and plan.   Signed: Darden Palmer, MD 11/30/2012, 7:37 PM

## 2012-11-30 NOTE — Progress Notes (Signed)
[facility] provided pt with a [employer] application and a list of primary care resources. Also, provided pt with information on the MAP program through the Health Department. Patient and his mother both stated at that time that he did not have a PCP. Patient stated that he had an apt for eligibility and enrollment with the [facility] Card this past Weds, but he was too sick to go. Asked patient if he would like for me to reschedule his apt, but patient declined due to work schedule at this time.  Encouraged pt to get a PCP either through the [employer] or self-pay.

## 2012-11-30 NOTE — ED Notes (Signed)
Patient resting, no complaint of pain

## 2012-11-30 NOTE — H&P (Signed)
Date: 11/30/2012               Patient Name:  [name] MRN: [id number]  DOB: 23-Sep-1985 Age / Sex: 27 y.o., male   PCP: Linward Headland, MD         Medical Service: Internal Medicine Teaching Service         Attending Physician: Dr. Jonah Blue, DO    First Contact: Geronimo Boot, MS4 Pager: [phone]  Second Contact: Dr. Virgina Organ Pager: [phone]       After Hours (After 5p/  First Contact Pager: [phone]  weekends / holidays): Second Contact Pager: [phone]   Chief Complaint: chest pain, nausea, vomiting, abdominal pain  History of Present Illness: [name] is a 27 year old African American male with PMH of DM1 (since age 24) and asthma presenting to [facility] today with hyperglycemia and complaints of worsening chest pain, headaches, nausea and vomiting x2 days.  He says he ran out of his insulin on Wednesday and has not had any since then.  He says he does not go to his PCP often and also has trouble affording his medications.  CBG on admission 300s.  In regards to his chest pain, he says the chest pain started yesterday after vomiting. He describes it as severe, substernal and across b/l chest, constant, and also radiating to his back and both arms. He also has tenderness to palpation.  No exacerbating factors with alleviation of pain with several doses of morphine in ED.  Of note, he did receive 12mg  of morphine in the ED and at the time of our examination, he was very drowsy but alert and able to appropriately answer questions.    Of note, his last similar episode of DKA was in 2012.  At [facility] he was started on glucostabilizer and given ~3L NS bolus for severe metabolic acidosis with AG >30.   K was elevated to 5.7 with T wave elevations on EKG and 1g calcium gluconate was given.    Meds: Current Facility-Administered Medications  Medication Dose Route Frequency Provider Last Rate Last Dose  . 0.9 %  sodium chloride infusion  1,000 mL Intravenous Continuous Shanna Cisco, MD 125 mL/hr  at 11/30/12 1144 1,000 mL at 11/30/12 1144  . 0.9 %  sodium chloride infusion   Intravenous Continuous Darden Palmer, MD      . dextrose 5 %-0.45 % sodium chloride infusion   Intravenous Continuous Darden Palmer, MD 125 mL/hr at 11/30/12 1747    . dextrose 50 % solution 25 mL  25 mL Intravenous PRN Darden Palmer, MD      . enoxaparin (LOVENOX) injection 40 mg  40 mg Subcutaneous Q24H Darden Palmer, MD      . insulin regular (NOVOLIN R,HUMULIN R) 1 Units/mL in sodium chloride 0.9 % 100 mL infusion   Intravenous Continuous Darden Palmer, MD 4.3 mL/hr at 11/30/12 1748 4.3 Units/hr at 11/30/12 1748  . naloxone [facility]) injection 0.4 mg  0.4 mg Intravenous PRN Rhetta Mura, MD      . sodium chloride 0.9 % bolus 1,000 mL  1,000 mL Intravenous Once Darden Palmer, MD   1,000 mL at 11/30/12 1744  . sodium chloride 0.9 % bolus 1,000 mL  1,000 mL Intravenous Once Darden Palmer, MD        Allergies: Allergies as of 11/30/2012 - Review Complete 11/30/2012  Allergen Reaction Noted  . Shellfish allergy Anaphylaxis and Hives 04/13/2012   Past Medical History  Diagnosis Date  .  Asthma     no prior hospitalizations, intubations  . Diabetes mellitus     Type I   History reviewed. No pertinent past surgical history. Family History  Problem Relation Age of Onset  . Thyroid disease Mother   . Breast cancer Maternal Grandmother    History   Social History  . Marital Status: Single    Spouse Name: N/A    Number of Children: N/A  . Years of Education: N/A   Occupational History  . Not on file.   Social History Main Topics  . Smoking status: Never Smoker   . Smokeless tobacco: Never Used  . Alcohol Use: 0 - .5 oz/week    0-1 drink(s) per week     Comment: rarely  . Drug Use: No     Comment: daily  . Sexual Activity: Not on file   Other Topics Concern  . Not on file   Social History Narrative  . No narrative on file   Review of Systems:  Constitutional:  Decreased appetite  and fatigue. Denies fever, chills, appetite change and fatigue.   HEENT:  Polydipsia.  Denies congestion, sore throat, rhinorrhea, sneezing, mouth sores, trouble swallowing, neck pain   Respiratory:  Denies SOB, DOE, cough, and wheezing.   Cardiovascular:  Tender to palpation chest.  Denies palpitations and leg swelling.   Gastrointestinal:  Nausea, vomiting, abdominal pain.  Denies diarrhea, constipation, blood in stool and abdominal distention.   Genitourinary:  Polyuria.  Denies dysuria, urgency, hematuria, flank pain and difficulty urinating.   Musculoskeletal:  Denies myalgias, back pain, joint swelling, arthralgias and gait problem.   Skin:  Denies pallor, rash and wound.   Neurological:  Headache.  Denies dizziness, seizures, syncope, weakness, light-headedness, numbness.    Physical Exam: Blood pressure 143/79, pulse 101, temperature 99 F (37.2 C), temperature source Oral, resp. rate 16, SpO2 100.00%. Vitals reviewed. General: resting in bed, drowsy and sleepy HEENT: PERRL, EOMI, conjunctival injection b/l Cardiac: tachycardia, no rubs, murmurs or gallops Pulm: clear to auscultation bilaterally, no wheezes, rales, or rhonchi Abd: soft, mild tenderness to palpation of lower abdomen, nondistended, BS present Ext: warm and well perfused, no pedal edema, +2dp b/l Neuro: alert and oriented X3, drowsy, cranial nerves II-XII grossly intact, strength 2/5 b/l all extremities due to poor effort and sensation to light touch equal in bilateral upper and lower extremities  Lab results: Basic Metabolic Panel:  Recent Labs  56/21/30 0847 11/30/12 1354  NA 135 133*  K 5.2* 5.7*  CL 96 100  CO2 <7* <7*  GLUCOSE 395* 356*  BUN 18 17  CREATININE 0.99 0.95  CALCIUM 9.3 9.0   Liver Function Tests:  Recent Labs  11/30/12 0847  AST 13  ALT 14  ALKPHOS 84  BILITOT 0.3  PROT 7.8  ALBUMIN 4.4    Recent Labs  11/30/12 0847  LIPASE 9*   CBC:  Recent Labs  11/30/12 0930  WBC  13.7*  NEUTROABS 10.7*  HGB 16.5  HCT 48.3  MCV 87.8  PLT 221   Cardiac Enzymes:  Recent Labs  11/30/12 0930  TROPONINI <0.30   CBG:  Recent Labs  11/30/12 1222 11/30/12 1339 11/30/12 1442 11/30/12 1539 11/30/12 1652 11/30/12 1746  GLUCAP 311* 335* 278* 261* 257* 203*   Imaging results:  Dg Chest 2 View  11/30/2012   CLINICAL DATA:  Chest pain  EXAM: CHEST  2 VIEW  COMPARISON:  October 02, 2010  FINDINGS: The lungs are clear. Heart size  and pulmonary vascularity are normal. No adenopathy. No bone lesions. No pneumothorax.  IMPRESSION: No abnormality noted.   Electronically Signed   By: Bretta Bang M.D.   On: 11/30/2012 09:47   Other results: EKG: repeat @1702 : 87bpm, NSR  Assessment & Plan by Problem: Mr. Kwong is a 27 year old male with DM1 admitted for DKA 2/2 non-compliance with home insulin.  Principal Problem:   DKA, type 1 Active Problems:   Chest pain, unspecified  DKA in setting of type 1 DM with non-adherence to home insulin since 11/28/12.  Initial AG >30 with ABG:  7.073/23.4/41.7/6.5/66.5%. Home regimen of insulin includes:  Novolog 70/30 45 units in AM and 35 units in PM.  -admit to step down -continue NS (received 3L NS bolus x3 in ED, 2 more on arrival to [facility]) -glucostabilizer -CBG monitoring -BMET q2h, and adjust fluids per protocol, monitor K (up to 5.7 prior to admission) -NPO -HbA1C -U/A  Chest pain: likely musculoskeletal, tender to palpation. Improved with morphine, but given 12mg  of morphine in ED prior to transfer. Very drowsy on admission. Troponin poc x1 negative. EKG initially with peaked t waves now improved with NSR.  -EKG in AM -cycle CE -will hold on pain control given significant sedation for large dose prior to admission -UDS  Hyperkalemia: K up to 5.7 in ED with peaked T waves on EKG initially, now improved. 1g Calcium gluconate x1 given in ED.  -f/u bmet's, monitor K, replace as needed if hypokalemic in setting of DKA -AM  EKG   Diet: NPO DVT PPX: Lovenox Dispo: Disposition is deferred at this time, awaiting improvement of current medical problems. Anticipated discharge in approximately 2 day(s).   The patient does have a current PCP Linward Headland, MD) and does need an [facility] hospital follow-up appointment after discharge.  The patient does not have transportation limitations that hinder transportation to clinic appointments.  Signed: Darden Palmer, MD 11/30/2012, 6:36 PM   Attending addendum: Mr. Siek is currently homeless but living with her cousin until the end of this month. He has no certain plants beyond that time. He has been out of his insulin. He states that his last purchase of insulin was a little over a month ago and it cost him about $30 at [employer]. He just started working at [employer] 2 days ago. He had an appointment to go to the health department to obtain insulin but he did not make the appointment because of his first day of work at [employer]. His only means of transportation or walking or taking the bus. He does not recall seeing Dr. Janalyn Harder in our clinic last November. He states that he does not have a primary care provider. His DKA is resolving and his hyperkalemia has resolved. He should be able to start eating this afternoon and switched to sliding scale insulin. He will probably be ready for discharge tomorrow. We will do our best to reconnect him to primary care. He is also having significant sore throat for the past 24 hours and has posterior pharyngeal swelling without exudates. We will screen him for streptococcal pharyngitis.  Cliffton Asters, MD [facility] for Infectious Disease [facility] [phone] pager   [phone] cell 12/01/2012, 10:15 AM

## 2012-11-30 NOTE — ED Notes (Signed)
Patient told that if he did not wake up he was going to get medication to reverse his pain medication. Patient up and chewing on ice. Hospitalist aware that narcan not given

## 2012-11-30 NOTE — ED Notes (Signed)
co2 <7 critical lab reported to Tuesday RN ,

## 2012-11-30 NOTE — ED Notes (Signed)
Patient states that pain is everywhere. Appears to be less restless.

## 2012-11-30 NOTE — Progress Notes (Signed)
Rec'd call from [facility] with critical lab value of CO2 less than 7.  Result consistent with previously drawn labs from 0847.  Will continue to closely monitor.  Ithan Touhey, [facility]

## 2012-11-30 NOTE — ED Notes (Signed)
Patient is sleeping, easily arousable.

## 2012-11-30 NOTE — H&P (Signed)
Date: 11/30/2012               Patient Name:  [name] MRN: [id number]  DOB: 27-Oct-1985 Age / Sex: 27 y.o., male   PCP: Linward Headland, MD              Medical Service: Internal Medicine Teaching Service              Attending Physician: Dr. Jonah Blue, DO    First Contact: Geronimo Boot, MS 4 Pager: [phone]  Second Contact: Dr. Huel Coventry Pager: [phone]  Third Contact Dr. Windell Hummingbird Pager: [phone]       After Hours (After 5p/  First Contact Pager: [phone]  weekends / holidays): Second Contact Pager: [phone]   Chief Complaint: DKA  History of Present Illness:  Patient is a 27 yo AA M with asthma, type I diabetes mellitus and a remote history of prior DKA/hyperglycemia due to medication non-adherence in the past who was transferred from [facility] ED for management of DKA.  The patient presented to [facility] ED this morning (10/3) with chest pain, h/a, nausea, vomiting, hyperglycemia in setting of being without his insulin for 3 days.  Patient had an appointment for renewing his insulin prescription but missed his appointment due to starting a new job and has been unable to afford refills.  Prior to 10/1, the patient was in good health.  On 10/1 he started developing the aforementioned symptoms and started feeling poorly overall with weakness and poor PO intake with his last meal being Thursday evening.  His chest pain worsened acutely after an episode of non-bloody emesis on Thursday morning.  He described the chest pain as sharp, constant and over his sternum w/ radiation to both arms and straight through to the back with no aggravating or alleviating factors.  He denies fever, cough, SOB, numbness, weakness, leg pain/swelling, and diarrhea.    Significant laboratory studies at [facility] ED include FSBS of 393, K 5.2, CO2 <7, ABG pH 7.028, PaCO2 11.5, HCO3 2.9, large ketones on UA, WBC 13.7, and anion gap of 32 or higher.  Troponins were negative x1.  CXR was  unremarkable.  EKG showed diffuse ST-elevation/peaked T-waves and calcium gluconate 1g IV was given x1.  Prior to transfer, patient received 1L NS bolus x2, morphine 4mg  IV x3, Zofran 4mg  x1, and 3 units of regular insulin.     Meds: Current Facility-Administered Medications  Medication Dose Route Frequency Provider Last Rate Last Dose  . 0.9 %  sodium chloride infusion  1,000 mL Intravenous Continuous Shanna Cisco, MD 125 mL/hr at 11/30/12 1144 1,000 mL at 11/30/12 1144  . 0.9 %  sodium chloride infusion   Intravenous Continuous Darden Palmer, MD      . dextrose 5 %-0.45 % sodium chloride infusion   Intravenous Continuous Darden Palmer, MD 125 mL/hr at 11/30/12 1747    . dextrose 50 % solution 25 mL  25 mL Intravenous PRN Darden Palmer, MD      . enoxaparin (LOVENOX) injection 40 mg  40 mg Subcutaneous Q24H Darden Palmer, MD      . insulin regular (NOVOLIN R,HUMULIN R) 1 Units/mL in sodium chloride 0.9 % 100 mL infusion   Intravenous Continuous Darden Palmer, MD 4.3 mL/hr at 11/30/12 1748 4.3 Units/hr at 11/30/12 1748  . naloxone [facility]) injection 0.4 mg  0.4 mg Intravenous PRN Rhetta Mura, MD      . sodium chloride 0.9 % bolus 1,000 mL  1,000  mL Intravenous Once Darden Palmer, MD   1,000 mL at 11/30/12 1744  . sodium chloride 0.9 % bolus 1,000 mL  1,000 mL Intravenous Once Darden Palmer, MD        Allergies: Allergies as of 11/30/2012 - Review Complete 11/30/2012  Allergen Reaction Noted  . Shellfish allergy Anaphylaxis and Hives 04/13/2012   Past Medical History  Diagnosis Date  . Asthma     no prior hospitalizations, intubations  . Diabetes mellitus     Type I   History reviewed. No pertinent past surgical history. Family History  Problem Relation Age of Onset  . Thyroid disease Mother   . Breast cancer Maternal Grandmother    History   Social History  . Marital Status: Single    Spouse Name: N/A    Number of Children: N/A  . Years of Education: N/A    Occupational History  . Not on file.   Social History Main Topics  . Smoking status: Never Smoker   . Smokeless tobacco: Never Used  . Alcohol Use: 0 - .5 oz/week    0-1 drink(s) per week     Comment: twice/week  . Drug Use: No     Comment: daily  . Sexual Activity: Not on file   Other Topics Concern  . Not on file   Social History Narrative  . No narrative on file    Review of Systems: Pertinent items are noted in HPI.  Physical Exam: Blood pressure 143/79, pulse 101, temperature 99 F (37.2 C), temperature source Oral, resp. rate 16, SpO2 100.00%. BP 143/79  Pulse 101  Temp(Src) 99 F (37.2 C) (Oral)  Resp 16  SpO2 100%  General appearance: sleepy but arousable, mildly sedated but cooperative and mentating sufficiently to give history Head: Normocephalic, without obvious abnormality, atraumatic Eyes: conjunctivae/corneas clear. PERRL, EOM's intact. Fundi benign. Throat: oral mucosa dry, no erythema or swelling Neck: no adenopathy and supple, symmetrical, trachea midline Lungs: clear to auscultation bilaterally Chest wall: moderate tenderness to palpation over sternum and anterior chest wall bilaterally Heart: regular rate and rhythm, S1, S2 normal, no murmur, click, rub or gallop Abdomen: normal bowel sounds, mild tenderness to palpation lower quadrants bilaterally, no guarding or rebound, no organomegaly Extremities: extremities normal, atraumatic, no cyanosis or edema Pulses: 2+ and symmetric Neurologic: Grossly normal  Lab results: BMET    Component Value Date/Time   NA 133* 11/30/2012 1354   K 5.7* 11/30/2012 1354   CL 100 11/30/2012 1354   CO2 <7* 11/30/2012 1354   GLUCOSE 356* 11/30/2012 1354   BUN 17 11/30/2012 1354   CREATININE 0.95 11/30/2012 1354   CREATININE 0.92 01/04/2012 1357   CALCIUM 9.0 11/30/2012 1354   GFRNONAA >90 11/30/2012 1354   GFRAA >90 11/30/2012 1354    CBC    Component Value Date/Time   WBC 13.7* 11/30/2012 0930   RBC 5.50  11/30/2012 0930   HGB 16.5 11/30/2012 0930   HCT 48.3 11/30/2012 0930   PLT 221 11/30/2012 0930   MCV 87.8 11/30/2012 0930   MCH 30.0 11/30/2012 0930   MCHC 34.2 11/30/2012 0930   RDW 13.7 11/30/2012 0930   LYMPHSABS 2.6 11/30/2012 0930   MONOABS 0.4 11/30/2012 0930   EOSABS 0.0 11/30/2012 0930   BASOSABS 0.0 11/30/2012 0930    ABG    Component Value Date/Time   PHART 7.195* 11/30/2012 1633   PCO2ART 21.7* 11/30/2012 1633   PO2ART 116.0* 11/30/2012 1633   HCO3 8.1* 11/30/2012 1633   TCO2  8.7 11/30/2012 1633   ACIDBASEDEF 18.6* 11/30/2012 1633   O2SAT 98.3 11/30/2012 1633   Cardiac Panel (last 3 results)  Recent Labs  11/30/12 0930  TROPONINI <0.30      Imaging results:  Dg Chest 2 View  11/30/2012   CLINICAL DATA:  Chest pain  EXAM: CHEST  2 VIEW  COMPARISON:  October 02, 2010  FINDINGS: The lungs are clear. Heart size and pulmonary vascularity are normal. No adenopathy. No bone lesions. No pneumothorax.  IMPRESSION: No abnormality noted.   Electronically Signed   By: Bretta Bang M.D.   On: 11/30/2012 09:47    Other results: EKG: repeat EKG after admission @17 :02 showed normal sinus rhythm with resolution of peaked T-waves seen on previous EKG done at 0830.  Assessment & Plan by Problem: Active Problems:  1. Diabetic ketoacidosis - Patient is a 27 yo AA M with asthma and DM type 1 who was admitted from [facility] ED with severe DKA.  Plan to obtain BMET q2 to monitor anion gap, repeat ABG at RA, repeat CBC w/diff, repeat EKG, UA, and trend troponins x2.  Will bolus 1L NS x2 and will adjust IVF and replete K per DKA protocol.  Patient started on insulin drip per DKA protocol and will follow chemistries until gap closes with transition to  insulin.  2. Chest Pain - reproducible on palpation; repeat EKG shows normal sinus rhythm without evidence of ischemia, will trend troponins x2.  This is a Psychologist, occupational Note.  The care of the patient was discussed with Dr. Virgina Organ and the  assessment and plan was formulated with their assistance.  Please see their note for official documentation of the patient encounter.   Signed: Lowella Fairy, Med Student 11/30/2012, 5:56 PM

## 2012-11-30 NOTE — ED Provider Notes (Signed)
CSN: [id number]     Arrival date & time 11/30/12  0815 History   First MD Initiated Contact with Patient 11/30/12 [phone]     Chief Complaint  Patient presents with  . Hyperglycemia   (Consider location/radiation/quality/duration/timing/severity/associated sxs/prior Treatment) HPI Comments: Pt is a 27 y.o. male with Pmhx as above who presents with chest pain, h/a, nausea, vomiting, hyperglycemia in setting of being without his insulin for 3 days.  Denies fever, cough, SOB, numbness, weakness, leg pain/swelling.  He states he gets his insulin from the ED and doesn't have a PCP.  Patient is a 27 y.o. male presenting with hyperglycemia.  Hyperglycemia Blood sugar level PTA:  300's Severity:  Moderate Onset quality:  Unable to specify Duration:  3 days Timing:  Unable to specify Progression:  Unable to specify Chronicity:  Recurrent Current diabetic treatments: noncompliance. Current diabetic therapy:  Novolog Time since last antidiabetic medication:  3 days Context: noncompliance   Relieved by:  Nothing Ineffective treatments:  None tried Associated symptoms: abdominal pain, chest pain, dehydration, nausea, polyuria and vomiting   Associated symptoms: no confusion, no dizziness, no dysuria, no fatigue, no fever, no increased thirst, no shortness of breath, no syncope and no weight change   Associated symptoms comment:  Headache Abdominal pain:    Location:  Epigastric   Quality:  Aching and sharp   Severity:  Moderate   Onset quality:  Unable to specify   Duration:  3 days   Timing:  Constant   Progression:  Unchanged   Chronicity:  New Chest pain:    Quality:  Sharp   Severity:  Moderate   Onset quality:  Unable to specify   Duration:  3 days   Timing:  Constant   Progression:  Unchanged   Chronicity:  New Vomiting:    Quality:  Unable to specify   Number of occurrences:  1   Severity:  Mild   Duration:  1 day   Timing:  Rare   Progression:  Unchanged   Past Medical  History  Diagnosis Date  . Asthma     no prior hospitalizations, intubations  . Diabetes mellitus     Type I   History reviewed. No pertinent past surgical history. Family History  Problem Relation Age of Onset  . Thyroid disease Mother    History  Substance Use Topics  . Smoking status: Never Smoker   . Smokeless tobacco: Never Used  . Alcohol Use: Yes     Comment: twice/week    Review of Systems  Constitutional: Positive for activity change and appetite change. Negative for fever and fatigue.  HENT: Negative for congestion, facial swelling, rhinorrhea and trouble swallowing.   Eyes: Negative for photophobia and pain.  Respiratory: Negative for cough, chest tightness and shortness of breath.   Cardiovascular: Positive for chest pain. Negative for leg swelling and syncope.  Gastrointestinal: Positive for nausea, vomiting and abdominal pain. Negative for diarrhea and constipation.  Endocrine: Positive for polyuria. Negative for polydipsia.  Genitourinary: Negative for dysuria, urgency, decreased urine volume and difficulty urinating.  Musculoskeletal: Negative for back pain and gait problem.  Skin: Negative for color change, rash and wound.  Allergic/Immunologic: Negative for immunocompromised state.  Neurological: Negative for dizziness, facial asymmetry, speech difficulty, weakness, numbness and headaches.  Psychiatric/Behavioral: Negative for confusion, decreased concentration and agitation.    Allergies  Shellfish allergy  Home Medications   Current Outpatient Rx  Name  Route  Sig  Dispense  Refill  . albuterol (  PROVENTIL HFA;VENTOLIN HFA) 108 (90 BASE) MCG/ACT inhaler   Inhalation   Inhale 2 puffs into the lungs every 6 (six) hours as needed for wheezing.         . insulin aspart protamine-insulin aspart (NOVOLOG 70/30) (70-30) 100 UNIT/ML injection   Subcutaneous   Inject 35-45 Units into the skin 2 (two) times daily. Injects 45 units in the morning and 35  units in the evening          BP 143/79  Pulse 101  Temp(Src) 99.1 F (37.3 C) (Oral)  Resp 16  SpO2 100% Physical Exam  Constitutional: He is oriented to person, place, and time. He appears well-developed and well-nourished. No distress.  HENT:  Head: Normocephalic and atraumatic.  Mouth/Throat: Mucous membranes are dry. No oropharyngeal exudate.  Eyes: Pupils are equal, round, and reactive to light.  Neck: Normal range of motion. Neck supple.  Cardiovascular: Normal rate, regular rhythm and normal heart sounds.  Exam reveals no gallop and no friction rub.   No murmur heard. Pulmonary/Chest: Effort normal and breath sounds normal. No respiratory distress. He has no wheezes. He has no rales. He exhibits tenderness.    Abdominal: Soft. Bowel sounds are normal. He exhibits no distension and no mass. There is no tenderness. There is no rigidity, no rebound and no guarding.  Musculoskeletal: Normal range of motion. He exhibits no edema and no tenderness.  Neurological: He is alert and oriented to person, place, and time.  Skin: Skin is warm and dry.  Psychiatric: He has a normal mood and affect.    ED Course  Procedures (including critical care time) Labs Review Labs Reviewed  GLUCOSE, CAPILLARY - Abnormal; Notable for the following:    Glucose-Capillary 393 (*)    All other components within normal limits  BLOOD GAS, VENOUS - Abnormal; Notable for the following:    pH, Ven 7.073 (*)    pCO2, Ven 23.4 (*)    Bicarbonate 6.5 (*)    Acid-base deficit 23.9 (*)    All other components within normal limits  KETONES, QUALITATIVE - Abnormal; Notable for the following:    Acetone, Bld LARGE (*)    All other components within normal limits  LIPASE, BLOOD - Abnormal; Notable for the following:    Lipase 9 (*)    All other components within normal limits  CBC WITH DIFFERENTIAL - Abnormal; Notable for the following:    WBC 13.7 (*)    Neutrophils Relative % 78 (*)    Neutro Abs  10.7 (*)    All other components within normal limits  COMPREHENSIVE METABOLIC PANEL - Abnormal; Notable for the following:    Potassium 5.2 (*)    CO2 <7 (*)    Glucose, Bld 395 (*)    All other components within normal limits  GLUCOSE, CAPILLARY - Abnormal; Notable for the following:    Glucose-Capillary 364 (*)    All other components within normal limits  BLOOD GAS, ARTERIAL - Abnormal; Notable for the following:    pH, Arterial 7.028 (*)    pCO2 arterial 11.5 (*)    pO2, Arterial 129.0 (*)    Bicarbonate 2.9 (*)    Acid-base deficit 29.4 (*)    All other components within normal limits  GLUCOSE, CAPILLARY - Abnormal; Notable for the following:    Glucose-Capillary 377 (*)    All other components within normal limits  GLUCOSE, CAPILLARY - Abnormal; Notable for the following:    Glucose-Capillary 311 (*)  All other components within normal limits  GLUCOSE, CAPILLARY - Abnormal; Notable for the following:    Glucose-Capillary 335 (*)    All other components within normal limits  GLUCOSE, CAPILLARY - Abnormal; Notable for the following:    Glucose-Capillary 278 (*)    All other components within normal limits  TROPONIN I  CBC WITH DIFFERENTIAL  BASIC METABOLIC PANEL   Imaging Review Dg Chest 2 View  11/30/2012   CLINICAL DATA:  Chest pain  EXAM: CHEST  2 VIEW  COMPARISON:  October 02, 2010  FINDINGS: The lungs are clear. Heart size and pulmonary vascularity are normal. No adenopathy. No bone lesions. No pneumothorax.  IMPRESSION: No abnormality noted.   Electronically Signed   By: Bretta Bang M.D.   On: 11/30/2012 09:47     Date: 11/30/2012  Rate: 84  Rhythm: normal sinus rhythm  QRS Axis: normal  Intervals: normal  ST/T Wave abnormalities: Slight ST elevation throughout, less than 2mm  Conduction Disutrbances:none  Narrative Interpretation:   Old EKG Reviewed: ST elevation throughout, seen on prior, though slightly more prominent.  No ST depression.  Likely early  repolarization.     MDM   1. DKA (diabetic ketoacidoses)    Pt is a 27 y.o. male with Pmhx as above who presents with chest pain, h/a, nausea, vomiting, hyperglycemia in setting of being without his insulin for 3 days.  Chest pain low sternal w/ radiation to back, no aggravating or alleviating factors.  Denies fever, cough, SOB, numbness, weakness, leg pain/swelling.  On PE, pt appears uncomfortable, dry mucus membranes, but in NAD.  Abdominal exam benign. +reproducible lower chest pain. Cardiopulm exam otherwise benign. FSBG here 393.   10:30 PM W/U c/w DKA w/ pH 7.07, PCO2 23.4, K 5.2, CO2 <7, large ketones, and AG of at least 32 or more.  Glucostabilizer protocol ordered. 1g Calcium gluconate also ordered.  [facility] as well as critical care PA has seen the Pt in the ED, and it is agreed that pt stable for transfer to step-down and [facility] to teaching service.   1. DKA (diabetic ketoacidoses)         Shanna Cisco, MD 11/30/12 [phone]

## 2012-12-01 DIAGNOSIS — E1110 Type 2 diabetes mellitus with ketoacidosis without coma: Secondary | ICD-10-CM

## 2012-12-01 LAB — RAPID URINE DRUG SCREEN, HOSP PERFORMED
Amphetamines: NOT DETECTED
Benzodiazepines: NOT DETECTED
Cocaine: NOT DETECTED
Opiates: POSITIVE — AB
Tetrahydrocannabinol: NOT DETECTED

## 2012-12-01 LAB — GLUCOSE, CAPILLARY
Glucose-Capillary: 138 mg/dL — ABNORMAL HIGH (ref 70–99)
Glucose-Capillary: 130 mg/dL — ABNORMAL HIGH (ref 70–99)
Glucose-Capillary: 145 mg/dL — ABNORMAL HIGH (ref 70–99)
Glucose-Capillary: 163 mg/dL — ABNORMAL HIGH (ref 70–99)
Glucose-Capillary: 198 mg/dL — ABNORMAL HIGH (ref 70–99)
Glucose-Capillary: 175 mg/dL — ABNORMAL HIGH (ref 70–99)
Glucose-Capillary: 335 mg/dL — ABNORMAL HIGH (ref 70–99)

## 2012-12-01 LAB — BASIC METABOLIC PANEL
BUN: 10 mg/dL (ref 6–23)
BUN: 7 mg/dL (ref 6–23)
BUN: 8 mg/dL (ref 6–23)
BUN: 9 mg/dL (ref 6–23)
CO2: 13 mEq/L — ABNORMAL LOW (ref 19–32)
CO2: 15 mEq/L — ABNORMAL LOW (ref 19–32)
CO2: 15 mEq/L — ABNORMAL LOW (ref 19–32)
CO2: 15 mEq/L — ABNORMAL LOW (ref 19–32)
CO2: 16 mEq/L — ABNORMAL LOW (ref 19–32)
CO2: 16 mEq/L — ABNORMAL LOW (ref 19–32)
Calcium: 8.1 mg/dL — ABNORMAL LOW (ref 8.4–10.5)
Calcium: 8.2 mg/dL — ABNORMAL LOW (ref 8.4–10.5)
Calcium: 8.4 mg/dL (ref 8.4–10.5)
Calcium: 8.2 mg/dL — ABNORMAL LOW (ref 8.4–10.5)
Calcium: 8.5 mg/dL (ref 8.4–10.5)
Chloride: 106 mEq/L (ref 96–112)
Chloride: 108 mEq/L (ref 96–112)
Chloride: 109 mEq/L (ref 96–112)
Chloride: 109 mEq/L (ref 96–112)
Chloride: 109 mEq/L (ref 96–112)
Chloride: 110 mEq/L (ref 96–112)
Creatinine, Ser: 0.81 mg/dL (ref 0.50–1.35)
Creatinine, Ser: 0.8 mg/dL (ref 0.50–1.35)
Creatinine, Ser: 0.78 mg/dL (ref 0.50–1.35)
Creatinine, Ser: 0.77 mg/dL (ref 0.50–1.35)
Creatinine, Ser: 0.75 mg/dL (ref 0.50–1.35)
Creatinine, Ser: 0.75 mg/dL (ref 0.50–1.35)
Creatinine, Ser: 0.72 mg/dL (ref 0.50–1.35)
GFR calc Af Amer: >90 mL/min (ref >90)
GFR calc Af Amer: >90 mL/min (ref >90)
GFR calc Af Amer: >90 mL/min (ref >90)
GFR calc Af Amer: >90 mL/min (ref >90)
GFR calc Af Amer: >90 mL/min (ref >90)
GFR calc Af Amer: >90 mL/min (ref >90)
GFR calc Af Amer: >90 mL/min (ref >90)
GFR calc non Af Amer: >90 mL/min (ref >90)
GFR calc non Af Amer: >90 mL/min (ref >90)
GFR calc non Af Amer: >90 mL/min (ref >90)
GFR calc non Af Amer: >90 mL/min (ref >90)
GFR calc non Af Amer: >90 mL/min (ref >90)
GFR calc non Af Amer: >90 mL/min (ref >90)
Glucose, Bld: 141 mg/dL — ABNORMAL HIGH (ref 70–99)
Glucose, Bld: 148 mg/dL — ABNORMAL HIGH (ref 70–99)
Glucose, Bld: 151 mg/dL — ABNORMAL HIGH (ref 70–99)
Glucose, Bld: 152 mg/dL — ABNORMAL HIGH (ref 70–99)
Glucose, Bld: 199 mg/dL — ABNORMAL HIGH (ref 70–99)
Glucose, Bld: 210 mg/dL — ABNORMAL HIGH (ref 70–99)
Potassium: 4 mEq/L (ref 3.5–5.1)
Potassium: 4.4 mEq/L (ref 3.5–5.1)
Potassium: 4.2 mEq/L (ref 3.5–5.1)
Potassium: 3.9 mEq/L (ref 3.5–5.1)
Potassium: 3.9 mEq/L (ref 3.5–5.1)
Potassium: 3.6 mEq/L (ref 3.5–5.1)
Sodium: 137 mEq/L (ref 135–145)
Sodium: 135 mEq/L (ref 135–145)
Sodium: 135 mEq/L (ref 135–145)

## 2012-12-01 LAB — URINALYSIS, ROUTINE W REFLEX MICROSCOPIC
Glucose, UA: >1000 mg/dL — AB
Hgb urine dipstick: NEGATIVE
Specific Gravity, Urine: 1.03 (ref 1.005–1.030)

## 2012-12-01 LAB — CBC WITH DIFFERENTIAL/PLATELET
Basophils Absolute: 0 10*3/uL (ref 0.0–0.1)
Basophils Relative: 0 % (ref 0–1)
Eosinophils Absolute: 0.1 10*3/uL (ref 0.0–0.7)
Eosinophils Relative: 1 % (ref 0–5)
HCT: 41.9 % (ref 39.0–52.0)
Hemoglobin: 15.2 g/dL (ref 13.0–17.0)
MCH: 30.6 pg (ref 26.0–34.0)
MCHC: 36.3 g/dL — ABNORMAL HIGH (ref 30.0–36.0)
MCV: 84.3 fL (ref 78.0–100.0)
Monocytes Absolute: 0.7 10*3/uL (ref 0.1–1.0)
Monocytes Relative: 7 % (ref 3–12)

## 2012-12-01 LAB — URINE MICROSCOPIC-ADD ON

## 2012-12-01 LAB — HEMOGLOBIN A1C
Hgb A1c MFr Bld: 12.6 % — ABNORMAL HIGH (ref <5.7)
Mean Plasma Glucose: 315 mg/dL — ABNORMAL HIGH (ref <117)

## 2012-12-01 MED ORDER — INSULIN GLARGINE 100 UNIT/ML ~~LOC~~ SOLN
10.0000 [IU] | Freq: Once | SUBCUTANEOUS | Status: AC
  Administered 2012-12-01: 10 [IU] via SUBCUTANEOUS
  Filled 2012-12-01: qty 0.1

## 2012-12-01 MED ORDER — POTASSIUM CHLORIDE 10 MEQ/100ML IV SOLN
10.0000 meq | INTRAVENOUS | Status: AC
  Administered 2012-12-01 (×2): 10 meq via INTRAVENOUS
  Filled 2012-12-01 (×2): qty 100

## 2012-12-01 MED ORDER — ACETAMINOPHEN 325 MG PO TABS
650.0000 mg | ORAL_TABLET | Freq: Four times a day (QID) | ORAL | Status: DC | PRN
  Administered 2012-12-01: 650 mg via ORAL
  Filled 2012-12-01: qty 2

## 2012-12-01 MED ORDER — INSULIN ASPART 100 UNIT/ML ~~LOC~~ SOLN
0.0000 [IU] | SUBCUTANEOUS | Status: DC
  Administered 2012-12-02: 2 [IU] via SUBCUTANEOUS
  Administered 2012-12-02: 5 [IU] via SUBCUTANEOUS
  Administered 2012-12-02: 3 [IU] via SUBCUTANEOUS
  Administered 2012-12-02: 2 [IU] via SUBCUTANEOUS

## 2012-12-01 MED ORDER — SODIUM CHLORIDE 0.9 % IV SOLN
1000.0000 mL | INTRAVENOUS | Status: DC
  Administered 2012-12-01: 1000 mL via INTRAVENOUS

## 2012-12-01 NOTE — Progress Notes (Signed)
Subjective: Mr. [name] was seen and examined at bedside. He was sleeping but did wake up to answer questions. He reports a sore throat this morning and is thirsty and hungry. He does recognize his PCP's name but says he has not seen him in a while but is willing to see him upon discharge. He denies any chest pain, vomiting, abdominal pain, sob, fever, chills, or any urinary complaints at this time.  He did have a headache last night that has since then resolved.   Objective: Vital signs in last 24 hours: Filed Vitals:   11/30/12 1954 12/01/12 0038 12/01/12 0407 12/01/12 0800  BP: 112/71 113/67 124/70 103/68  Pulse: 75 77 72 73  Temp: 98.9 F (37.2 C) 98.6 F (37 C) 98.3 F (36.8 C) 98.6 F (37 C)  TempSrc: Oral Axillary Axillary Oral  Resp: 15 12 12 13   Weight:   190 lb 4.1 oz (86.3 kg)   SpO2: 100% 99% 100% 100%   Weight change:   Intake/Output Summary (Last 24 hours) at 12/01/12 0853 Last data filed at 12/01/12 0731  Gross per 24 hour  Intake 1584.04 ml  Output   1800 ml  Net -215.96 ml   Vitals reviewed. General: resting in bed, NAD HEENT: PERRL, EOMI, large uvula Cardiac: RRR, no rubs, murmurs or gallops Pulm: clear to auscultation bilaterally, no wheezes, rales, or rhonchi Abd: soft, nontender, nondistended, BS present Ext: warm and well perfused, no pedal edema Neuro: alert and oriented X3, cranial nerves II-XII grossly intact, strength and sensation to light touch equal in bilateral upper and lower extremities  Lab Results: Basic Metabolic Panel:  Recent Labs Lab 12/01/12 0230 12/01/12 0442  NA 138 137  K 4.4 4.2  CL 109 108  CO2 16* 15*  GLUCOSE 151* 152*  BUN 9 9  CREATININE 0.80 0.79  CALCIUM 8.2* 8.2*   Liver Function Tests:  Recent Labs Lab 11/30/12 0847  AST 13  ALT 14  ALKPHOS 84  BILITOT 0.3  PROT 7.8  ALBUMIN 4.4    Recent Labs Lab 11/30/12 0847  LIPASE 9*   CBC:  Recent Labs Lab 11/30/12 0930 11/30/12 1832 12/01/12 0442   WBC 13.7* 15.3* 11.1*  NEUTROABS 10.7*  --  8.3*  HGB 16.5 15.1 15.2  HCT 48.3 42.3 41.9  MCV 87.8 86.0 84.3  PLT 221 226 224   Cardiac Enzymes:  Recent Labs Lab 11/30/12 1833 11/30/12 2234 12/01/12 0442  TROPONINI <0.30 <0.30 <0.30   CBG:  Recent Labs Lab 12/01/12 0316 12/01/12 0422 12/01/12 0521 12/01/12 0620 12/01/12 0730 12/01/12 0826  GLUCAP 134* 145* 148* 147* 139* 163*   Urine Drug Screen: Drugs of Abuse     Component Value Date/Time   LABOPIA POSITIVE* 12/01/2012 0726   COCAINSCRNUR NONE DETECTED 12/01/2012 0726   LABBENZ NONE DETECTED 12/01/2012 0726   AMPHETMU NONE DETECTED 12/01/2012 0726   THCU NONE DETECTED 12/01/2012 0726   LABBARB NONE DETECTED 12/01/2012 0726    Urinalysis:  Recent Labs Lab 12/01/12 0726  COLORURINE YELLOW  LABSPEC 1.030  PHURINE 5.5  GLUCOSEU >1000*  HGBUR NEGATIVE  BILIRUBINUR MODERATE*  KETONESUR 40*  PROTEINUR 30*  UROBILINOGEN 0.2  NITRITE NEGATIVE  LEUKOCYTESUR NEGATIVE   Micro Results: Recent Results (from the past 240 hour(s))  MRSA PCR SCREENING     Status: None   Collection Time    11/30/12  3:48 PM      Result Value Range Status   MRSA by PCR NEGATIVE  NEGATIVE  Final   Comment:            The GeneXpert MRSA Assay (FDA     approved for NASAL specimens     only), is one component of a     comprehensive MRSA colonization     surveillance program. It is not     intended to diagnose MRSA     infection nor to guide or     monitor treatment for     MRSA infections.   Studies/Results: Dg Chest 2 View  11/30/2012   CLINICAL DATA:  Chest pain  EXAM: CHEST  2 VIEW  COMPARISON:  October 02, 2010  FINDINGS: The lungs are clear. Heart size and pulmonary vascularity are normal. No adenopathy. No bone lesions. No pneumothorax.  IMPRESSION: No abnormality noted.   Electronically Signed   By: Bretta Bang M.D.   On: 11/30/2012 09:47   Medications: I have reviewed the patient's current medications. Scheduled  Meds: . enoxaparin (LOVENOX) injection  40 mg Subcutaneous Q24H   Continuous Infusions: . sodium chloride 1,000 mL (11/30/12 1144)  . sodium chloride Stopped (11/30/12 1747)  . dextrose 5 % and 0.45% NaCl 150 mL/hr at 11/30/12 2340  . insulin (NOVOLIN-R) infusion 1 Units/hr (12/01/12 0731)   PRN Meds:.dextrose, naLOXone (NARCAN)  injection Assessment/Plan: Principal Problem:   DKA, type 1 Active Problems:   Chest pain, unspecified Mr. Carneal is a 27 year old male with DM1 admitted for DKA 2/2 non-compliance with home insulin.   DKA in setting of type 1 DM with non-adherence to home insulin since 11/28/12. AG improving. Initial AG >30 now 13. Home regimen of insulin includes: Novolog 70/30 45 units in AM and 35 units in PM.  -glucostabilizer protocol -CBG monitoring  -BMET q2h, and adjust fluids per protocol, monitor electrolytes -add K -once AG closed, will transition to subQ insulin and initiate feeding -NPO  -HbA1C 12.5 -U/A ketones 40 with >1000 glucose and moderate bilirubin -social work and diabetes educator  Chest pain: Resolved. Likely musculoskeletal, tender to palpation. Improved with morphine, but given 12mg  of morphine in ED prior to transfer. Very drowsy on admission. Cardiac enzymes negative. EKG initially with peaked t waves now improved with NSR. UDS positive for opiates only -continue to monitor -PRN pain  Dispo: Disposition is deferred at this time, awaiting improvement of current medical problems.  Anticipated discharge in approximately 1 day(s).   The patient does have a current PCP Linward Headland, MD) and does need an [facility] hospital follow-up appointment after discharge.  The patient does have transportation limitations that hinder transportation to clinic appointments.  .Services Needed at time of discharge: Y = Yes, Blank = No PT:   OT:   RN:   Equipment:   Other:     LOS: 1 day   Darden Palmer, MD 12/01/2012, 8:53 AM

## 2012-12-01 NOTE — Progress Notes (Signed)
Pt's blood sugar has been in the target range since last night but since his CO2 is still high, the MD covering for teaching service last night decided to keep pt on the insulin drip at a rate of 76ml/hr till his CO2 is with the target range. This has been reported to the on-coming nurse. ----Juan Mcminn, rn

## 2012-12-01 NOTE — Progress Notes (Signed)
  I have seen and examined the patient, and reviewed the daily progress note by Geronimo Boot, MS 4 and discussed the care of the patient with them. Please see my progress note from 12/01/2012 for further details regarding assessment and plan.    Signed:  Darden Palmer, MD 12/01/2012, 1:19 PM

## 2012-12-01 NOTE — Progress Notes (Signed)
Subjective: No acute events overnight.  Patient is afebrile with vital signs stabilizing overnight.  Patient complained of headache overnight and was given toradol 15mg  IV x2 with resolution of symptoms.  This morning he complains of intermittent headache and sore throat, which he noticed yesterday and has persisted.  Glucose has trended down (see lab results) and D5 1/2 NS was started at 134ml/hr.  AG has also decreased, but still above normal range and he has been maintained on insulin drip at 70ml/hr.    Objective: Vital signs in last 24 hours:  Vital Signs:   Filed Vitals:   11/30/12 1954 12/01/12 0038 12/01/12 0407 12/01/12 0800  BP: 112/71 113/67 124/70 103/68  Pulse: 75 77 72 73  Temp: 98.9 F (37.2 C) 98.6 F (37 C) 98.3 F (36.8 C) 98.6 F (37 C)  TempSrc: Oral Axillary Axillary Oral  Resp: 15 12 12 13   Weight:   86.3 kg (190 lb 4.1 oz)   SpO2: 100% 99% 100% 100%   Temp:  [98.3 F (36.8 C)-99.1 F (37.3 C)] 98.6 F (37 C) (10/04 0800) Pulse Rate:  [72-105] 73 (10/04 0800) Resp:  [12-22] 13 (10/04 0800) BP: (103-170)/(67-92) 103/68 mmHg (10/04 0800) SpO2:  [99 %-100 %] 100 % (10/04 0800) Weight:  [86.3 kg (190 lb 4.1 oz)] 86.3 kg (190 lb 4.1 oz) (10/04 0407)    Intake/Output:   Intake/Output Summary (Last 24 hours) at 12/01/12 0911 Last data filed at 12/01/12 0731  Gross per 24 hour  Intake 1584.04 ml  Output   1800 ml  Net -215.96 ml      Physical Exam: General: Vital signs reviewed and noted. Well-developed, well-nourished, in no acute distress; still drowsy, but appropriate and cooperative throughout examination.  HEENT: No tender LA, small R tonsillar node appreciated, uvula is enlarged/elongated and floppy, no erythema or swelling of tonsils, airway still patent  Lungs:  Normal respiratory effort. Clear to auscultation BL without crackles or wheezes.  Heart: RRR. S1 and S2 normal without gallop, murmur, or rubs.  Abdomen:  BS normoactive. Soft,  Nondistended, still mildly uncomfortable to palpation, but still no rebound or guarding.  No masses or organomegaly.  Extremities: No pretibial edema.   Lab Results: Basic Metabolic Panel:  Recent Labs Lab 11/30/12 2052 11/30/12 2234 12/01/12 0031 12/01/12 0230 12/01/12 0442  NA 138 139 139 138 137  K 4.5 4.2 4.0 4.4 4.2  CL 109 110 109 109 108  CO2 12* 13* 15* 16* 15*  GLUCOSE 199* 148* 141* 151* 152*  BUN 10 10 9 9 9   CREATININE 0.84 0.81 0.82 0.80 0.79  CALCIUM 8.1* 8.1* 8.2* 8.2* 8.2*    CBC:  Recent Labs Lab 11/30/12 0930 11/30/12 1832 12/01/12 0442  WBC 13.7* 15.3* 11.1*  NEUTROABS 10.7*  --  8.3*  HGB 16.5 15.1 15.2  HCT 48.3 42.3 41.9  MCV 87.8 86.0 84.3  PLT 221 226 224   ABG:    Component Value Date/Time   PHART 7.195* 11/30/2012 1633   PCO2ART 21.7* 11/30/2012 1633   PO2ART 116.0* 11/30/2012 1633   HCO3 8.1* 11/30/2012 1633   TCO2 8.7 11/30/2012 1633   ACIDBASEDEF 18.6* 11/30/2012 1633   O2SAT 98.3 11/30/2012 1633     Liver Function Tests:  Recent Labs Lab 11/30/12 0847  AST 13  ALT 14  ALKPHOS 84  BILITOT 0.3  PROT 7.8  ALBUMIN 4.4    Recent Labs Lab 11/30/12 0847  LIPASE 9*    Cardiac Enzymes:  Recent  Labs Lab 11/30/12 0930 11/30/12 1833 11/30/12 2234 12/01/12 0442  TROPONINI <0.30 <0.30 <0.30 <0.30    CBG:  Recent Labs Lab 12/01/12 0422 12/01/12 0521 12/01/12 0620 12/01/12 0730 12/01/12 0826  GLUCAP 145* 148* 147* 139* 163*    Microbiology: Results for orders placed during the hospital encounter of 11/30/12  MRSA PCR SCREENING     Status: None   Collection Time    11/30/12  3:48 PM      Result Value Range Status   MRSA by PCR NEGATIVE  NEGATIVE Final   Comment:            The GeneXpert MRSA Assay (FDA     approved for NASAL specimens     only), is one component of a     comprehensive MRSA colonization     surveillance program. It is not     intended to diagnose MRSA     infection nor to guide or      monitor treatment for     MRSA infections.   Recent Results (from the past 240 hour(s))  MRSA PCR SCREENING     Status: None   Collection Time    11/30/12  3:48 PM      Result Value Range Status   MRSA by PCR NEGATIVE  NEGATIVE Final   Comment:            The GeneXpert MRSA Assay (FDA     approved for NASAL specimens     only), is one component of a     comprehensive MRSA colonization     surveillance program. It is not     intended to diagnose MRSA     infection nor to guide or     monitor treatment for     MRSA infections.    Other results: EKG:  11/30/12 at 1702 showed normal sinus rhythm, resolution of peaked T-waves on comparison to prior EKG 12/01/12 at 0741 showed normal sinus rhythm, largely unchanged to prior Imaging: Dg Chest 2 View  11/30/2012   CLINICAL DATA:  Chest pain  EXAM: CHEST  2 VIEW  COMPARISON:  October 02, 2010  FINDINGS: The lungs are clear. Heart size and pulmonary vascularity are normal. No adenopathy. No bone lesions. No pneumothorax.  IMPRESSION: No abnormality noted.   Electronically Signed   By: Bretta Bang M.D.   On: 11/30/2012 09:47     Medications: I have reviewed the patient's current medications. Scheduled Meds: . enoxaparin (LOVENOX) injection  40 mg Subcutaneous Q24H   Continuous Infusions: . sodium chloride 1,000 mL (11/30/12 1144)  . sodium chloride Stopped (11/30/12 1747)  . dextrose 5 % and 0.45% NaCl 150 mL/hr at 11/30/12 2340  . insulin (NOVOLIN-R) infusion 1 Units/hr (12/01/12 0731)   PRN Meds:.dextrose, naLOXone (NARCAN)  injection  Assessment/Plan: Principal Problem:   DKA, type 1 Active Problems:   Chest pain, unspecified   Summary: [name] is a 27 y.o. M with asthma and type I DM with severe DKA transferred from [facility] for management.  Patient has improved both subjectively and based upon chemistries and blood gas.  AG has trended down from 32 to high teens overnight, now hovering just above normal.  Blood  glucose has also come down to ~150.  Chest pain and headaches appear to be improving.  K values stable ~4.5, not supplemented.  Of note, patient has poor social support system and has been homeless for what appears to be quite some time now, living months  at a time at friends/family's houses.  He currently lives with his cousin, where he has been for the past couple months and will only be able to stay there until the end of this month.  Prior to that, he was living in a shelter in [place].  He has no mode of transportation and as a result has not been able to hold a steady job due to his homelessness requiring him to move from place to place.  He currently just started working at [employer] this past week.  His unstable income and housing has led to him being uninsured and lost to follow-up care.  He gets his medications/insulin from either ED or over the counter at [facility] and has trouble affording them.  Impression: 1. DKA 2. Chest pain 3. Hyperkalemia 4. Uvulitis? 5. Poor social support  Plan: 1. DKA  Continue insulin drip at 64ml/hr per glucostabilizer protocol  Continue to trend BMETs for AG closure x2, then start transition to long-acting insulin (Lantus 10U)  Continue D5 1/2NS at 191ml/hr until insulin drip is stopped  NPO  Diabetes education  2. Chest Pain  Troponin neg x3  Normal EKG x2  No further studies, will continue to monitor  3. Hyperkalemia  Resolved, will replete K+ as needed per protocol  4. Uvulitis?  Unclear etiology, non-infectious cases more likely in adults vs. Infectious but patient still has elevated WBC at 11.1, though could still be reactive leukocytosis from DKA  Saline gargle for comfort  Consider swab for culture if no improvement or worsening of symptoms and WBC   5. Poor social support  Consult social work and case management for help with social situation and insurance status  Has agreed to follow-up with IM here at [facility], has seen Dr.  Janalyn Harder in 2013  This is a Psychologist, occupational Note.  The care of the patient was discussed with Dr. Huel Coventry and the assessment and plan formulated with their assistance.  Please see their attached note for official documentation of the daily encounter.   LOS: 1 day   Signed: Arna Snipe Internal Medicine, MS4 Pager [phone] 12/01/2012, 9:11 AM

## 2012-12-02 DIAGNOSIS — E875 Hyperkalemia: Secondary | ICD-10-CM | POA: Diagnosis present

## 2012-12-02 LAB — BASIC METABOLIC PANEL
BUN: 7 mg/dL (ref 6–23)
CO2: 18 mEq/L — ABNORMAL LOW (ref 19–32)
Calcium: 8.5 mg/dL (ref 8.4–10.5)
Calcium: 9 mg/dL (ref 8.4–10.5)
Chloride: 107 mEq/L (ref 96–112)
Chloride: 107 mEq/L (ref 96–112)
Chloride: 106 mEq/L (ref 96–112)
GFR calc Af Amer: >90 mL/min (ref >90)
GFR calc Af Amer: >90 mL/min (ref >90)
GFR calc Af Amer: >90 mL/min (ref >90)
GFR calc non Af Amer: >90 mL/min (ref >90)
GFR calc non Af Amer: >90 mL/min (ref >90)
Potassium: 3.8 mEq/L (ref 3.5–5.1)
Potassium: 3.8 mEq/L (ref 3.5–5.1)
Sodium: 137 mEq/L (ref 135–145)
Sodium: 137 mEq/L (ref 135–145)

## 2012-12-02 LAB — GLUCOSE, CAPILLARY
Glucose-Capillary: 280 mg/dL — ABNORMAL HIGH (ref 70–99)
Glucose-Capillary: 282 mg/dL — ABNORMAL HIGH (ref 70–99)

## 2012-12-02 MED ORDER — INFLUENZA VAC SPLIT QUAD 0.5 ML IM SUSP
0.5000 mL | INTRAMUSCULAR | Status: AC
  Administered 2012-12-02: 0.5 mL via INTRAMUSCULAR
  Filled 2012-12-02: qty 0.5

## 2012-12-02 MED ORDER — INSULIN ASPART PROT & ASPART (70-30 MIX) 100 UNIT/ML ~~LOC~~ SUSP: SUBCUTANEOUS | Status: DC

## 2012-12-02 MED ORDER — INFLUENZA VAC SPLIT QUAD 0.5 ML IM SUSP: 0.5000 mL | INTRAMUSCULAR | Status: DC

## 2012-12-02 MED ORDER — INSULIN ASPART PROT & ASPART (70-30 MIX) 100 UNIT/ML ~~LOC~~ SUSP: 35.0000 [IU] | Freq: Two times a day (BID) | SUBCUTANEOUS | Status: DC

## 2012-12-02 NOTE — Discharge Summary (Signed)
Name: [name] MRN: [id number] DOB: 1985/09/19 27 y.o. PCP: Linward Headland, MD  Date of Admission: 11/30/2012  8:16 AM Date of Discharge: 12/02/2012 Attending Physician: Dr. Orvan Falconer Discharge Diagnosis: Principal Problem:   DKA, type 1 Active Problems:   Chest pain, unspecified   Hyperkalemia  Discharge Medications:   Medication List         albuterol 108 (90 BASE) MCG/ACT inhaler  Commonly known as:  PROVENTIL HFA;VENTOLIN HFA  Inhale 2 puffs into the lungs every 6 (six) hours as needed for wheezing.     insulin aspart protamine- aspart (70-30) 100 UNIT/ML injection  Commonly known as:  NOVOLOG MIX 70/30  Injects 45 units in the morning and 35 units in the evening       Disposition and follow-up:   [name] was discharged from [facility] in Stable condition.  At the hospital follow up visit please address:  1. Home insulin regimen adjustment, A1C 12.6 this admission.  2.  Assistance with finances and adherence to medication and affordability 3.  Consider donna plyler visit as well 4.  Monitor K   Pending labs/ test needing follow-up: consider rapid strep test if still has sore throat.   Follow-up Appointments:     Follow-up Information   Schedule an appointment as soon as possible for a visit with Janalyn Harder, MD.   Specialty:  Internal Medicine   Contact information:   [address] [place] [address] [phone]       [facility] (call monday after discharge for appt)    Contact information:   [phone]     Discharge Instructions: Discharge Orders   Future Orders Complete By Expires   Call MD for:  difficulty breathing, headache or visual disturbances  As directed    Call MD for:  extreme fatigue  As directed    Call MD for:  persistant nausea and vomiting  As directed    Call MD for:  severe uncontrolled pain  As directed    Call MD for:  As directed    Comments:     If  blood sugars running very high >300 or if unable to get insulin.   Diet - low sodium heart healthy  As directed    Increase activity slowly  As directed      Procedures Performed:  Dg Chest 2 View  11/30/2012   CLINICAL DATA:  Chest pain  EXAM: CHEST  2 VIEW  COMPARISON:  October 02, 2010  FINDINGS: The lungs are clear. Heart size and pulmonary vascularity are normal. No adenopathy. No bone lesions. No pneumothorax.  IMPRESSION: No abnormality noted.   Electronically Signed   By: Bretta Bang M.D.   On: 11/30/2012 09:47   Admission HPI: Mr. Stapel is a 27 year old African American male with PMH of DM1 (since age 61) and asthma presenting to [facility] today with hyperglycemia and complaints of worsening chest pain, headaches, nausea and vomiting x2 days. He says he ran out of his insulin on Wednesday and has not had any since then. He says he does not go to his PCP often and also has trouble affording his medications. CBG on admission 300s. In regards to his chest pain, he says the chest pain started yesterday after vomiting. He describes it as severe, substernal and across b/l chest, constant, and also radiating to his back and both arms. He also has tenderness to palpation. No exacerbating factors  with alleviation of pain with several doses of morphine in ED. Of note, he did receive 12mg  of morphine in the ED and at the time of our examination, he was very drowsy but alert and able to appropriately answer questions.  Of note, his last similar episode of DKA was in 2012. At [facility], he was started on glucostabilizer and given ~3L NS bolus for severe metabolic acidosis with AG >30. K was elevated to 5.7 with T wave elevations on EKG and 1g calcium gluconate was given.   Hospital Course by problem list: Principal Problem:   DKA, type 1 Active Problems:   Chest pain, unspecified   Hyperkalemia   DKA in setting of type 1 DM with non-adherence to home insulin since 11/28/12. Resolved during admission with  treatment including glucostabilizer protocol, potassium replacement, transitioned to [place] insulin, and tolerating diet.  Initial AG >30 and resolved at time of admission. Home regimen of insulin includes: Novolog 70/30 45 units in AM and 35 units in PM but did not take insulin for two days prior to admission due to financial restraints.  Social work and case management were consulted during admission and he was provided resources for assistance.  He is also agreeable to follow up again with his PCP in [facility] and that will be arranged on discharge.  We discussed medication compliance in detail.  He has already utilized the [facility] program and claimed that he is able to afford his insulin at this time.  Will discharge on home regimen that will need to be adjusted with pcp and outpatient diabetes educator. HbA1C 12.5 this admission.   Chest pain: initially presented with chest pain that resolved with morphine. Likely musculoskeletal, tender to palpation on admission. He did receive 12mg  of morphine in the [facility] prior to transfer to [facility] leading to drowsiness upon admission but improved during hospital course. Cardiac enzymes negative. EKG initially showed peaked t waves and since then improved with NSR. UDS was positive for opiates only.   Hyperkalemia: K up to 5.7 on day of admission with initial EKG showing peaked t waves.  He received calcium gluconate 1g x1 in [facility] and repeat EKG at time of admission showed improvement of t waves.  Hyperkalemia resolved with ongoing DKA and insulin therapy and was supplemented per protocol as well.  K at time of discharge 3.8.  Discharge Vitals:   BP 124/77  Pulse 78  Temp(Src) 98.4 F (36.9 C) (Oral)  Resp 26  Ht 6' (1.829 m)  Wt 190 lb 4.1 oz (86.3 kg)  BMI 25.8 kg/m2  SpO2 100%  Discharge Labs:  Results for orders placed during the hospital encounter of 11/30/12 (from the past 24 hour(s))  BASIC METABOLIC PANEL     Status: Abnormal   Collection Time    12/01/12   1:40 PM      Result Value Range   Sodium 135  135 - 145 mEq/L   Potassium 3.9  3.5 - 5.1 mEq/L   Chloride 108  96 - 112 mEq/L   CO2 16 (*) 19 - 32 mEq/L   Glucose, Bld 210 (*) 70 - 99 mg/dL   BUN 8  6 - 23 mg/dL   Creatinine, Ser 9.60  0.50 - 1.35 mg/dL   Calcium 8.2 (*) 8.4 - 10.5 mg/dL   GFR calc non Af Amer >90  >90 mL/min   GFR calc Af Amer >90  >90 mL/min  GLUCOSE, CAPILLARY     Status: Abnormal   Collection Time  12/01/12  1:58 PM      Result Value Range   Glucose-Capillary 164 (*) 70 - 99 mg/dL  GLUCOSE, CAPILLARY     Status: Abnormal   Collection Time    12/01/12  3:01 PM      Result Value Range   Glucose-Capillary 175 (*) 70 - 99 mg/dL  BASIC METABOLIC PANEL     Status: Abnormal   Collection Time    12/01/12  3:40 PM      Result Value Range   Sodium 135  135 - 145 mEq/L   Potassium 3.9  3.5 - 5.1 mEq/L   Chloride 107  96 - 112 mEq/L   CO2 15 (*) 19 - 32 mEq/L   Glucose, Bld 192 (*) 70 - 99 mg/dL   BUN 7  6 - 23 mg/dL   Creatinine, Ser 1.61  0.50 - 1.35 mg/dL   Calcium 8.2 (*) 8.4 - 10.5 mg/dL   GFR calc non Af Amer >90  >90 mL/min   GFR calc Af Amer >90  >90 mL/min  GLUCOSE, CAPILLARY     Status: Abnormal   Collection Time    12/01/12  4:03 PM      Result Value Range   Glucose-Capillary 198 (*) 70 - 99 mg/dL   Comment 1 Documented in Chart     Comment 2 Notify RN    GLUCOSE, CAPILLARY     Status: Abnormal   Collection Time    12/01/12  5:06 PM      Result Value Range   Glucose-Capillary 151 (*) 70 - 99 mg/dL   Comment 1 Documented in Chart     Comment 2 Notify RN    BASIC METABOLIC PANEL     Status: Abnormal   Collection Time    12/01/12  5:40 PM      Result Value Range   Sodium 136  135 - 145 mEq/L   Potassium 3.6  3.5 - 5.1 mEq/L   Chloride 109  96 - 112 mEq/L   CO2 17 (*) 19 - 32 mEq/L   Glucose, Bld 159 (*) 70 - 99 mg/dL   BUN 7  6 - 23 mg/dL   Creatinine, Ser 0.96  0.50 - 1.35 mg/dL   Calcium 8.5  8.4 - 04.5 mg/dL   GFR calc non Af Amer  >90  >90 mL/min   GFR calc Af Amer >90  >90 mL/min  GLUCOSE, CAPILLARY     Status: Abnormal   Collection Time    12/01/12 10:19 PM      Result Value Range   Glucose-Capillary 335 (*) 70 - 99 mg/dL  GLUCOSE, CAPILLARY     Status: Abnormal   Collection Time    12/02/12 12:12 AM      Result Value Range   Glucose-Capillary 282 (*) 70 - 99 mg/dL  BASIC METABOLIC PANEL     Status: Abnormal   Collection Time    12/02/12  2:15 AM      Result Value Range   Sodium 136  135 - 145 mEq/L   Potassium 3.8  3.5 - 5.1 mEq/L   Chloride 107  96 - 112 mEq/L   CO2 17 (*) 19 - 32 mEq/L   Glucose, Bld 253 (*) 70 - 99 mg/dL   BUN 8  6 - 23 mg/dL   Creatinine, Ser 4.09  0.50 - 1.35 mg/dL   Calcium 8.5  8.4 - 81.1 mg/dL   GFR calc non Af Amer >90  >90  mL/min   GFR calc Af Amer >90  >90 mL/min  GLUCOSE, CAPILLARY     Status: Abnormal   Collection Time    12/02/12  4:19 AM      Result Value Range   Glucose-Capillary 197 (*) 70 - 99 mg/dL  BASIC METABOLIC PANEL     Status: Abnormal   Collection Time    12/02/12  6:29 AM      Result Value Range   Sodium 137  135 - 145 mEq/L   Potassium 3.6  3.5 - 5.1 mEq/L   Chloride 107  96 - 112 mEq/L   CO2 18 (*) 19 - 32 mEq/L   Glucose, Bld 198 (*) 70 - 99 mg/dL   BUN 7  6 - 23 mg/dL   Creatinine, Ser 2.13  0.50 - 1.35 mg/dL   Calcium 8.5  8.4 - 08.6 mg/dL   GFR calc non Af Amer >90  >90 mL/min   GFR calc Af Amer >90  >90 mL/min  GLUCOSE, CAPILLARY     Status: Abnormal   Collection Time    12/02/12  7:49 AM      Result Value Range   Glucose-Capillary 174 (*) 70 - 99 mg/dL  BASIC METABOLIC PANEL     Status: Abnormal   Collection Time    12/02/12  8:00 AM      Result Value Range   Sodium 137  135 - 145 mEq/L   Potassium 3.8  3.5 - 5.1 mEq/L   Chloride 106  96 - 112 mEq/L   CO2 18 (*) 19 - 32 mEq/L   Glucose, Bld 216 (*) 70 - 99 mg/dL   BUN 7  6 - 23 mg/dL   Creatinine, Ser 5.78  0.50 - 1.35 mg/dL   Calcium 9.0  8.4 - 46.9 mg/dL   GFR calc non Af  Amer >90  >90 mL/min   GFR calc Af Amer >90  >90 mL/min  GLUCOSE, CAPILLARY     Status: Abnormal   Collection Time    12/02/12 11:44 AM      Result Value Range   Glucose-Capillary 280 (*) 70 - 99 mg/dL   Signed: Darden Palmer, MD 12/02/2012, 1:17 PM   Time Spent on Discharge: 35 minutes Services Ordered on Discharge: outpatient diabetes education Equipment Ordered on Discharge: none

## 2012-12-02 NOTE — Progress Notes (Addendum)
Subjective: [name] was seen and examined at bedside. He was sleeping but did wake up to answer questions. He reports improvement in his sort throat today and says he slept well overnight. He is tolerating his diet well and denies any headaches, chest pain, nausea, vomiting, diarrhea, abdominal pain, or any urinary complaints.  He will speak to case management and social worker today prior to leaving to see about resources for financial and living assistance.  He has agreed to follow up in [facility] again and says he has enough money at this time to afford his insulin.  He wishes to be discharged today.   I discussed medication compliance in detail with him again today. He assured me that he can afford his insulin today and will pick up the prescription upon discharge and that he will follow up with opc. We hope that his living situation can be stabilized in the meantime that will likely lead to further compliance and improvement in his diabetes.  Objective: Vital signs in last 24 hours: Filed Vitals:   12/01/12 2000 12/02/12 0010 12/02/12 0420 12/02/12 0747  BP: 117/58  125/77 118/76  Pulse: 69 67 83 88  Temp:  98 F (36.7 C) 98.5 F (36.9 C) 98.3 F (36.8 C)  TempSrc:  Oral Oral Oral  Resp: 17 16 16 14   Height:      Weight:      SpO2: 99% 100% 100% 100%   Weight change:   Intake/Output Summary (Last 24 hours) at 12/02/12 1132 Last data filed at 12/02/12 0700  Gross per 24 hour  Intake 2589.81 ml  Output   1450 ml  Net 1139.81 ml   Vitals reviewed. General: resting in bed, NAD HEENT: PERRL, EOMI, large uvula Cardiac: RRR, no rubs, murmurs or gallops Pulm: clear to auscultation bilaterally, no wheezes, rales, or rhonchi Abd: soft, nontender, nondistended, BS present Ext: warm and well perfused, no pedal edema Neuro: alert and oriented X3, cranial nerves II-XII grossly intact, strength and sensation to light touch equal in bilateral upper and lower extremities  Lab  Results: Basic Metabolic Panel:  Recent Labs Lab 12/02/12 0629 12/02/12 0800  NA 137 137  K 3.6 3.8  CL 107 106  CO2 18* 18*  GLUCOSE 198* 216*  BUN 7 7  CREATININE 0.75 0.73  CALCIUM 8.5 9.0   Liver Function Tests:  Recent Labs Lab 11/30/12 0847  AST 13  ALT 14  ALKPHOS 84  BILITOT 0.3  PROT 7.8  ALBUMIN 4.4    Recent Labs Lab 11/30/12 0847  LIPASE 9*   CBC:  Recent Labs Lab 11/30/12 0930 11/30/12 1832 12/01/12 0442  WBC 13.7* 15.3* 11.1*  NEUTROABS 10.7*  --  8.3*  HGB 16.5 15.1 15.2  HCT 48.3 42.3 41.9  MCV 87.8 86.0 84.3  PLT 221 226 224   Cardiac Enzymes:  Recent Labs Lab 11/30/12 1833 11/30/12 2234 12/01/12 0442  TROPONINI <0.30 <0.30 <0.30   CBG:  Recent Labs Lab 12/01/12 1603 12/01/12 1706 12/01/12 2219 12/02/12 0012 12/02/12 0419 12/02/12 0749  GLUCAP 198* 151* 335* 282* 197* 174*   Urine Drug Screen: Drugs of Abuse     Component Value Date/Time   LABOPIA POSITIVE* 12/01/2012 0726   COCAINSCRNUR NONE DETECTED 12/01/2012 0726   LABBENZ NONE DETECTED 12/01/2012 0726   AMPHETMU NONE DETECTED 12/01/2012 0726   THCU NONE DETECTED 12/01/2012 0726   LABBARB NONE DETECTED 12/01/2012 0726    Urinalysis:  Recent Labs Lab 12/01/12 0726  COLORURINE  YELLOW  LABSPEC 1.030  PHURINE 5.5  GLUCOSEU >1000*  HGBUR NEGATIVE  BILIRUBINUR MODERATE*  KETONESUR 40*  PROTEINUR 30*  UROBILINOGEN 0.2  NITRITE NEGATIVE  LEUKOCYTESUR NEGATIVE   Micro Results: Recent Results (from the past 240 hour(s))  MRSA PCR SCREENING     Status: None   Collection Time    11/30/12  3:48 PM      Result Value Range Status   MRSA by PCR NEGATIVE  NEGATIVE Final   Comment:            The GeneXpert MRSA Assay (FDA     approved for NASAL specimens     only), is one component of a     comprehensive MRSA colonization     surveillance program. It is not     intended to diagnose MRSA     infection nor to guide or     monitor treatment for     MRSA  infections.   Medications: I have reviewed the patient's current medications. Scheduled Meds: . enoxaparin (LOVENOX) injection  40 mg Subcutaneous Q24H  . insulin aspart  0-9 Units Subcutaneous Q4H   Continuous Infusions: . sodium chloride 1,000 mL (12/01/12 2038)   PRN Meds:.acetaminophen, dextrose, naLOXone (NARCAN)  injection Assessment/Plan: Principal Problem:   DKA, type 1 Active Problems:   Chest pain, unspecified [name] is a 27 year old male with DM1 admitted for DKA 2/2 non-compliance with home insulin.   DKA--resolved, in setting of type 1 DM with non-adherence to home insulin since 11/28/12. AG improving. Initial AG >30 now 13. Home regimen of insulin includes: Novolog 70/30 45 units in AM and 35 units in PM.  -tolerating diet, no complaints -HbA1C 12.5 -social work and diabetes educator, he assured me that he can afford his insulin today and will pick up after discharge  Chest pain: Resolved. Likely musculoskeletal, tender to palpation. Improved with morphine, but given 12mg  of morphine in ED prior to transfer. Very drowsy on admission now improved. Cardiac enzymes negative. EKG initially with peaked t waves now improved with NSR. UDS positive for opiates only  Dispo: D/C home today, patient contact number: [phone]  The patient does have a current PCP Linward Headland, MD) and does need an [facility] hospital follow-up appointment after discharge.  The patient does have transportation limitations that hinder transportation to clinic appointments.  .Services Needed at time of discharge: Y = Yes, Blank = No PT:   OT:   RN:   Equipment:   Other:     LOS: 2 days   Darden Palmer, MD 12/02/2012, 11:32 AM

## 2012-12-02 NOTE — Progress Notes (Signed)
Spoke briefly with MD regarding patient being discharged.  He has history of Type 1 diabetes since age 27.  He takes 70/30 insulin 45 units in the AM and 35 units in the PM and he states that he purchases it at [facility] for 30$ per vial. Patient will follow-up with Ned Clines, CDE at the clinic.      Called and talked with patient on the phone regarding his home diabetes regimen.  He admits that he does not check his CBG's at home.  Encouraged him to purchase a generic meter from [facility] which is 9$.  Also discussed normal CBG's and the importance of taking insulin and being on a regimen of eating regularly.  He states that CBG's are usually "ok" and around 250 mg/dL.  Explained the importance of keeping CBG's less than 150 mg/dL and goal Z6X=0.9%. He states that he ran out of insulin for a few days.  Discussed physiology of DKA and that he needs insulin for life and to take glucose into cells.  Needs lots of education and follow-up.

## 2012-12-02 NOTE — Progress Notes (Signed)
Pt given flue shot to Lt deltoid, discharge instructions discussed given, and signed by him. He verbalized understanding of the information. He acknowledged posetion and collection of all of his belongings all lines removed no bleeding or drainage noted, pressure dressing applied to sites. Education and care plans completed. Pt ambulated out off the unit accompanied by his visitors. He appeared free of distress, discomfort or pain.

## 2012-12-02 NOTE — Clinical Social Work Note (Signed)
CSW received call from [facility] RN stating that patient is homeless and will DC today. Patient was given shelter list by CSW and bus pass for transportation. RN stated that patient needs assistance with medication, CSW informed RN that weekend RNCM needs to be notified of this. RN will contact weekend RNCM. CSW signing off at this time.   Roddie Mc, [place], [place], [phone]

## 2012-12-02 NOTE — Progress Notes (Signed)
Weekend CSW provided patient with a list of shelters and information on [employer], as well as a Personal assistant. Patient requested information about the [facility] program, CSW contacted weekend CM Maralyn Sago to inform her.   Samuella Bruin, MSW, LCSWA Clinical Social Worker [facility]. [phone]'

## 2012-12-02 NOTE — Progress Notes (Signed)
   CARE MANAGEMENT NOTE 12/02/2012  Patient:  OSMAR, HOWTON   Account Number:  [id number]  Date Initiated:  12/02/2012  Documentation initiated by:  [facility]  Subjective/Objective Assessment:   Adm: DKA     Action/Plan:   discharge plan   Anticipated DC Date:  12/02/2012   Anticipated DC Plan:  HOME/SELF CARE  In-house referral  Clinical Social Worker         Choice offered to / List presented to:             Status of service:  Completed, signed off Medicare Important Message given?   (If response is "NO", the following Medicare IM given date fields will be blank) Date Medicare IM given:   Date Additional Medicare IM given:    Discharge Disposition:  HOME/SELF CARE  Per UR Regulation:    If discussed at Long Length of Stay Meetings, dates discussed:    Comments:  12/02/12 11:53 MD called CM to request MATCH and outpt diabetes referral.  Waiting on order and will fax referral to Diabetes Outpt Mgmt center.  MATCH has been used in September 2014 therefore pt is not eligable at this time. Pt stated to MD he can afford his insulin.   Though pt states he has a PCP,  [facility] handout given to pt and encouraged to pursue orange card/medicaid.  Pt is homeless and CSW counseling.  No other CM needs communicated, Freddy Jaksch, BSN, [place] [phone].

## 2012-12-03 LAB — BLOOD GAS, VENOUS
Patient temperature: 98.6
TCO2: 6.2 mmol/L (ref 0–100)
pH, Ven: 7.073 — CL (ref 7.250–7.300)

## 2012-12-06 ENCOUNTER — Telehealth: Payer: Self-pay | Admitting: Dietician

## 2012-12-06 NOTE — Telephone Encounter (Signed)
Was asked to follow up with patient after recent hospitalization. Sent note to front office to schedule hospital follow up and tried calling patient on both listed phone numbers. Unable to reach him on either.  His listed phone number is not working, the listed work number did not know who he is.

## 2012-12-06 NOTE — Telephone Encounter (Signed)
Tried his mother's phone phone and left a message for him to call [phone] to schedule an appointment. Left CDE number as well for questions or conserns.

## 2012-12-06 NOTE — Telephone Encounter (Signed)
Thank you for following up on this issue.  I agree that the patient should be seen soon.

## 2012-12-11 NOTE — Telephone Encounter (Addendum)
Patient left a message that he scheduled an appointment with Dr. Brown(12-19-12) .  Returned his call and his mother answered, she reports he should not have transportation difficulty, ( is working and rides the bus) has his insulin and as far as she knows his blood sugars are okay. She expresses concern about early morning hypoglycemia, but reports that to her knowledge he has not had any since his discharge from the hospital. Encouraged her to have him call us if he has problems with transportation to his appointment or any problems or questions.

## 2012-12-19 ENCOUNTER — Encounter: Payer: Self-pay | Admitting: Dietician

## 2012-12-19 ENCOUNTER — Encounter: Payer: Self-pay | Admitting: Internal Medicine

## 2012-12-19 ENCOUNTER — Ambulatory Visit (INDEPENDENT_AMBULATORY_CARE_PROVIDER_SITE_OTHER): Payer: Self-pay | Admitting: Internal Medicine

## 2012-12-19 ENCOUNTER — Ambulatory Visit (INDEPENDENT_AMBULATORY_CARE_PROVIDER_SITE_OTHER): Payer: Self-pay | Admitting: Dietician

## 2012-12-19 VITALS — BP 131/75 | HR 92 | Temp 98.0°F | Ht 72.5 in | Wt 198.4 lb

## 2012-12-19 DIAGNOSIS — E109 Type 1 diabetes mellitus without complications: Secondary | ICD-10-CM

## 2012-12-19 DIAGNOSIS — R51 Headache: Secondary | ICD-10-CM

## 2012-12-19 DIAGNOSIS — Z733 Stress, not elsewhere classified: Secondary | ICD-10-CM

## 2012-12-19 DIAGNOSIS — F439 Reaction to severe stress, unspecified: Secondary | ICD-10-CM | POA: Insufficient documentation

## 2012-12-19 MED ORDER — WAVESENSE AMP W/DEVICE KIT: 1.0000 | PACK | Freq: Once | Status: DC

## 2012-12-19 MED ORDER — GLUCOSE BLOOD VI STRP: ORAL_STRIP | Status: DC

## 2012-12-19 MED ORDER — PROMETHAZINE HCL 12.5 MG PO TABS
25.0000 mg | ORAL_TABLET | Freq: Once | ORAL | Status: AC
  Administered 2012-12-19: 25 mg via ORAL

## 2012-12-19 MED ORDER — PROMETHAZINE HCL 12.5 MG PO TABS: 12.5000 mg | ORAL_TABLET | Freq: Four times a day (QID) | ORAL | Status: DC | PRN

## 2012-12-19 MED ORDER — INSULIN ASPART PROT & ASPART (70-30 MIX) 100 UNIT/ML ~~LOC~~ SUSP: SUBCUTANEOUS | Status: DC

## 2012-12-19 MED ORDER — "INSULIN SYRINGE 31G X 5/16"" 1 ML MISC": Status: DC

## 2012-12-19 MED ORDER — KETOROLAC TROMETHAMINE 60 MG/2ML IM SOLN
60.0000 mg | Freq: Once | INTRAMUSCULAR | Status: AC
  Administered 2012-12-19: 60 mg via INTRAMUSCULAR

## 2012-12-19 MED ORDER — NAPROXEN 500 MG PO TABS: 500.0000 mg | ORAL_TABLET | Freq: Two times a day (BID) | ORAL | Status: AC

## 2012-12-19 NOTE — Assessment & Plan Note (Signed)
The patient notes symptoms of headache, with some features of migraine. Cluster headaches are another possibility, since they occur behind the eye, but they are not occurring with a frequency and timing expected for cluster headaches. -Gave Toradol 60 mg IM shot times one today, and Phenergan 25 mg tablet -A prescription for by mouth naproxen and Phenergan, for future headaches, since triptans are likely too expensive at this point for him

## 2012-12-19 NOTE — Assessment & Plan Note (Addendum)
Lab Results  Component Value Date   HGBA1C 12.6* 12/01/2012   HGBA1C 12.5 01/04/2012   HGBA1C 13.7* 10/02/2010     Assessment: Diabetes control: poor control (HgbA1C >9%) Progress toward A1C goal:  unchanged Comments: The patient's last A1c was 12.6. His CBG today is 187. He has not been checking his blood sugars. Without data, it is difficult to know how to adjust his insulin. Cost is the greatest barrier to affording insulin and glucometer/strips. We will focus first on getting the orange card, and patient given the blue sheet with criteria today.  Plan: Medications:  Continue 70/30 insulin, 45 units qam, 35 units qpm Home glucose monitoring: Frequency: no home glucose monitoring Timing: N/A Instruction/counseling given: reminded to bring blood glucose meter & log to each visit Educational resources provided:   Self management tools provided:   Other plans: Printed prescriptions for glucometer, test strips, and insulin given to patient today. We'll check urine microalbumin and retinal scanner at patient's next visit if he has obtained the orange card by then.

## 2012-12-19 NOTE — Patient Instructions (Signed)
General Instructions: The first step today is to get the [employer].  Gather the documents listed on the [employer], call Rudell Cobb for an appointment, and bring those documents to our clinic to obtain this card.  -with this card, insulin vials will be $6/month, and glucose test strips will be $6/box (you will probably need 2 boxes/month)  We are keeping your insulin the same.  Use Novolog, 70/30, 45 units every morning, 35 units every evening.  Your headaches may be due to a migraine.  We are prescribing Naproxen, 1 tablet twice per day as needed.  You may take this with Phenergan, 1 tablet with the Naproxen as needed to try to break this headache.  Please return for a follow-up visit in 2-3 weeks.  Please schedule a visit with Lupita Leash Plyler at this time, as well as a retinal scan.   Treatment Goals:  Goals (1 Years of Data) as of 12/19/12   None      Progress Toward Treatment Goals:  Treatment Goal 12/19/2012  Hemoglobin A1C unchanged    Self Care Goals & Plans:  No flowsheet data found.  No flowsheet data found.   Care Management & Community Referrals:  Referral 12/19/2012  Referrals made for care management support none needed

## 2012-12-19 NOTE — Assessment & Plan Note (Addendum)
The patient notes significant stress in his life, mostly financial stress and his health problems. He has no active suicidal ideation or homicidal ideation at this time, so does not meet criteria for involuntary admission. We discussed taking things in baby steps, first trying to obtain the orange card to decrease his financial stress, which will allow him to afford his medications, which will improve his health. In the meantime, the patient was given information on the [place] walk-in clinic.

## 2012-12-19 NOTE — Progress Notes (Signed)
HPI The patient is a 27 y.o. male with a history of DM1, asthma, presenting for a HFU visit for DKA.  The patient was recently hospitalized 10/3-10/5 for DKA, due to running out of his insulin.  The patient continues to note financial difficulties, but he has been able to afford his 70/30 insulin for now, which he has been taking every day without missing a dose.  The patient has not been checking his blood sugars, because he does not have a glucometer.  The patient notes headache.  He describes this as a throbbing, pulling sensation, starting behind his eye, spreading to his left anterior and posterior head, which started at 7am this morning, and has been constant since that time.  He notes associated photophobia.  No nausea, vomiting.  He has tried taking tylenol, with no relief.  He notes similar headaches that "come and go", though for the last month they have been lasting longer (several hours), occurring 1-3 times per day, occuring at different times throughout the day.  He has tried [facility] powder as well.  The patient is very upset today, crying during the interview. He states that he is under a lot of stress, and it all feels like "too much". He states that he sometimes wishes he was not "here on this earth". However, he notes no current suicidal ideation, and no clear plan for a suicide attempt.  He states "I know I've never do it" but just notes a lot of stress.  ROS: General: no fevers, chills, changes in weight, changes in appetite Skin: no rash HEENT: no blurry vision, hearing changes, sore throat Pulm: no dyspnea, coughing, wheezing CV: no chest pain, palpitations, shortness of breath Abd: no abdominal pain, nausea/vomiting, diarrhea/constipation GU: no dysuria, hematuria, polyuria Ext: no arthralgias, myalgias Neuro: no weakness, numbness, or tingling  Filed Vitals:   12/19/12 1458  BP: 131/75  Pulse: 92  Temp: 98 F (36.7 C)    PEX General: sitting in chair, appears sad,  crying at times HEENT: PERRL though he notes discomfort with light, EOMI, oropharynx non-erythematous Neck: supple, no nuchal rigidity Lungs: clear to ascultation bilaterally, normal work of respiration, no wheezes, rales, ronchi Heart: regular rate and rhythm, no murmurs, gallops, or rubs Abdomen: soft, non-tender, non-distended, normal bowel sounds Extremities: no cyanosis, clubbing, or edema Neurologic: alert & oriented X3, cranial nerves II-XII intact, strength grossly intact, sensation intact to light touch  Current Outpatient Prescriptions on File Prior to Visit  Medication Sig Dispense Refill  . albuterol (PROVENTIL HFA;VENTOLIN HFA) 108 (90 BASE) MCG/ACT inhaler Inhale 2 puffs into the lungs every 6 (six) hours as needed for wheezing.      . insulin aspart protamine- aspart (NOVOLOG MIX 70/30) (70-30) 100 UNIT/ML injection Injects 45 units in the morning and 35 units in the evening  10 mL  1   No current facility-administered medications on file prior to visit.    Assessment/Plan

## 2012-12-20 ENCOUNTER — Telehealth: Payer: Self-pay | Admitting: Licensed Clinical Social Worker

## 2012-12-20 ENCOUNTER — Other Ambulatory Visit: Payer: Self-pay | Admitting: Internal Medicine

## 2012-12-20 NOTE — Telephone Encounter (Signed)
[name] was referred to CSW after discussion with CDE.  Pt voiced multiple stressors during his most recent [facility] appointment.  Pt was seen during his recent admission by inpatient social worker and provided resources.  Pt is aware of [facility] walk-in hours.  Pt has yet to make an appt to complete his GCCN/Orange card.  EMR states pt has voiced concern regarding the ability to afford his medications, thus the GCCN/Orange card and MAP program would be available and beneficial.  CSW placed called to pt.  CSW left message requesting return call. CSW provided contact hours and phone number.

## 2012-12-20 NOTE — Progress Notes (Signed)
Case discussed with Dr. Brown at the time of the visit.  We reviewed the resident's history and exam and pertinent patient test results.  I agree with the assessment, diagnosis and plan of care documented in the resident's note. 

## 2012-12-20 NOTE — Progress Notes (Addendum)
Diabetes Self-Management Education  Visit Number: First/Initial  12/20/2012 Mr. [name], identified by name and date of birth, is a 27 y.o. male with Diabetes Type: Type 1.  Other people present during visit:      ASSESSMENT  Patient Concerns:  Support- patient tearful throughout 2/3 of visit today; expressed lack of desire to live, hopelessness, difficulty with housing, holding down a job, affording diabetes supplies and medications, health care and fear of living with diabetes complications   Lab Results: LDL Cholesterol  Date Value Range Status  01/04/2012 66  0 - 99 mg/dL Final           Hemoglobin A1C  Date Value Range Status  12/01/2012 12.6* <5.7 % Final     (NOTE)                                                                            01/04/2012 12.5   Final     Family History  Problem Relation Age of Onset  . Thyroid disease Mother   . Breast cancer Maternal Grandmother    History  Substance Use Topics  . Smoking status: Never Smoker   . Smokeless tobacco: Never Used  . Alcohol Use: 0 - .5 oz/week    0-1 drink(s) per week     Comment: rarely    Support Systems: Does not see mother, cousin as supports Special Needs:  Large print;Other (comment)  Prior DM Education:   need to assess at future visit Daily Foot Exams:   need to assess at future visit Patient Belief / Attitude about Diabetes:  No matter what I do I can't control my blood glucose levels;Other (comment)  Assessment comments: patient conditions discussed with Dr. Manson Passey and Social work. Suggest referral to social work.  Patient reports he does not have funds for diabetes supplies until he gets paid( was out of work for many months this year. Today he was given 30 syringes, meter and 50 strips and Dr. Manson Passey gave him a sample vial of insulin. Patient should be hanged to basal bolus therapy soon as appropriate. Suspect hypoglycemia. He reports an episode of severe hypoglycemia where EMS called. He  denies drinking much alcohol that could have contributed to this episode.     Diet Recall: he is moving and reports no place to store or cook food, provided with small amount of food from pantry  Individualized Plan for Diabetes Self-Management Training:  Patient individualized diabetes plan not discussed today with patient due to his emotional state:  Dealing daily with diabetes  Education Topics Reviewed with Patient Today:  Topic Points Discussed  Disease State    Nutrition Management Effects of alcohol on blood glucose and safety factors with consumption of alcohol.  Physical Activity and Exercise    Medications    Monitoring    Acute Complications    Chronic Complications    Psychosocial Adjustment Identified and addressed patients feelings and concerns about diabetes;Brainstormed with patient on coping mechanisms for social situations, getting support from significant others, dealing with feelings about diabetes  Goal Setting Lifestyle issues that need to be addressed for better diabetes care  Preconception Care (if applicable)      PATIENTS GOALS  Learning Objective(s):     Goal The patient agrees to:  Nutrition    Physical Activity    Medications    Monitoring    Problem Solving  (patient agreed to go to [facility] walk in clinic tomorrow)  Reducing Risk    Health Coping     Patient Self-Evaluation of Goals (Subsequent Visits)  Goal The patient rates self as meeting goals (% of time)  Nutrition    Physical Activity    Medications    Monitoring    Problem Solving    Reducing Risk    Health Coping       PERSONALIZED PLAN / SUPPORT  Self-Management Support:  Support group;Family;CDE visits  Was living with cousin who was supportive, does not see mother as able to be supportive.  ______________________________________________________________________  Outcomes  Expected Outcomes:  Difficult to tell, despite depressed mood today, patient verbalized  responsibility for his outcomes Self-Care Barriers:  Impaired vision;Lack of transportation;Lack of material resources Education material provided: yes- Dr. Manson Passey gave patient information on Walk in clinic at  [facility].  If problems or questions, patient to contact team via:  Phone Time in: 1530     Time out: 1630 Future DSME appointment: - 2 wks   Plyler, Lupita Leash 12/20/2012 10:18 AM         Patient was provided with meter, strips and syringes today with the understanding that this was for the purpose to assist him in self care while working on mental health and orange card.

## 2012-12-21 ENCOUNTER — Telehealth: Payer: Self-pay | Admitting: *Deleted

## 2012-12-21 MED ORDER — PROMETHAZINE HCL 25 MG PO TABS: 25.0000 mg | ORAL_TABLET | Freq: Four times a day (QID) | ORAL | Status: DC | PRN

## 2012-12-21 NOTE — Telephone Encounter (Signed)
Fax from [employer] - pt can receive a cheaper price for Phenergan 25mg  Take 1/2 tab Po q6h PRN nausea #30. $44.71 for 15mg ; $10 fo25mg   Thanks

## 2012-12-21 NOTE — Telephone Encounter (Signed)
Change has been made, thank you.

## 2012-12-25 NOTE — Telephone Encounter (Signed)
CSW placed call to [name], pt is not home at this time anticipating return in 20 minutes.  CSW will attempt at a later time.

## 2012-12-28 NOTE — Telephone Encounter (Signed)
Phone number listed on chart is for pt's mother.  Mother states she has not seen son today.  CSW left message requesting return call. CSW provided contact hours and phone number.  CSW will send letter encouraging pt to schedule with financial counselor regarding GCCN/Orange Card and MAP information.  CSW will sign off.

## 2013-01-02 ENCOUNTER — Encounter: Payer: Self-pay | Admitting: Internal Medicine

## 2013-04-04 ENCOUNTER — Ambulatory Visit: Payer: Self-pay

## 2013-10-15 ENCOUNTER — Telehealth: Payer: Self-pay | Admitting: Internal Medicine

## 2013-10-15 ENCOUNTER — Encounter: Payer: Self-pay | Admitting: Internal Medicine

## 2013-10-15 NOTE — Telephone Encounter (Signed)
Attempted to contact patient this afternoon for future DM appt, but the telephone number on file is incorrect [phone].  Patient will be mailed a letter to the current address on file requesting the same.

## 2014-10-03 ENCOUNTER — Telehealth: Payer: Self-pay

## 2014-10-03 NOTE — Telephone Encounter (Signed)
Patient contacted regarding new intake appointment. Date and time given. Information given regarding documents needed to qualify for financial eligibility.  Tammy K King, RN  

## 2014-10-16 ENCOUNTER — Encounter (HOSPITAL_COMMUNITY): Payer: Self-pay | Admitting: Cardiology

## 2014-10-16 ENCOUNTER — Emergency Department (HOSPITAL_COMMUNITY)
Admission: EM | Admit: 2014-10-16 | Discharge: 2014-10-16 | Discharge status: Against Medical Advice | Payer: Self-pay | Attending: Emergency Medicine | Admitting: Emergency Medicine

## 2014-10-16 DIAGNOSIS — E11649 Type 2 diabetes mellitus with hypoglycemia without coma: Secondary | ICD-10-CM | POA: Insufficient documentation

## 2014-10-16 DIAGNOSIS — J45909 Unspecified asthma, uncomplicated: Secondary | ICD-10-CM | POA: Insufficient documentation

## 2014-10-16 LAB — CBG MONITORING, ED: GLUCOSE-CAPILLARY: 158 mg/dL — AB (ref 65–99)

## 2014-10-16 NOTE — ED Notes (Signed)
Checked patient blood sugar it was 158 notified RN Lillia Abed of blood sugar

## 2014-10-16 NOTE — ED Notes (Signed)
Pt to department via EMS- pt was at work and started feeling like his sugar was low. EMS reports that his CBG was 30 and given glucagon . Increased to 90. Pt alert and oriented at triage. Bp-136/96 Hr-74 Sat-99

## 2014-10-16 NOTE — ED Notes (Signed)
Pt called for a room pt did not answer

## 2014-10-16 NOTE — ED Notes (Signed)
Called for pt. At triage to go to room x3. No answer

## 2014-10-16 NOTE — ED Notes (Signed)
Called for pt. At triage x3. No answer

## 2014-10-21 ENCOUNTER — Ambulatory Visit (INDEPENDENT_AMBULATORY_CARE_PROVIDER_SITE_OTHER): Payer: Self-pay

## 2014-10-21 DIAGNOSIS — B20 Human immunodeficiency virus [HIV] disease: Secondary | ICD-10-CM

## 2014-10-21 LAB — CBC WITH DIFFERENTIAL/PLATELET
Basophils Absolute: 0 10*3/uL (ref 0.0–0.1)
Basophils Relative: 0 % (ref 0–1)
EOS ABS: 0.1 10*3/uL (ref 0.0–0.7)
Eosinophils Relative: 1 % (ref 0–5)
HCT: 46.7 % (ref 39.0–52.0)
HEMOGLOBIN: 15.4 g/dL (ref 13.0–17.0)
Lymphocytes Relative: 24 % (ref 12–46)
Lymphs Abs: 1.9 10*3/uL (ref 0.7–4.0)
MCH: 29.3 pg (ref 26.0–34.0)
MCHC: 33 g/dL (ref 30.0–36.0)
MCV: 88.8 fL (ref 78.0–100.0)
MPV: 9.4 fL (ref 8.6–12.4)
Monocytes Absolute: 0.4 10*3/uL (ref 0.1–1.0)
Monocytes Relative: 5 % (ref 3–12)
NEUTROS ABS: 5.6 10*3/uL (ref 1.7–7.7)
NEUTROS PCT: 70 % (ref 43–77)
Platelets: 240 10*3/uL (ref 150–400)
RBC: 5.26 MIL/uL (ref 4.22–5.81)
RDW: 15.2 % (ref 11.5–15.5)
WBC: 8 10*3/uL (ref 4.0–10.5)

## 2014-10-21 LAB — COMPLETE METABOLIC PANEL WITH GFR
ALBUMIN: 3.9 g/dL (ref 3.6–5.1)
ALK PHOS: 48 U/L (ref 40–115)
ALT: 16 U/L (ref 9–46)
AST: 16 U/L (ref 10–40)
BUN: 13 mg/dL (ref 7–25)
CO2: 27 mmol/L (ref 20–31)
Calcium: 9.7 mg/dL (ref 8.6–10.3)
Chloride: 101 mmol/L (ref 98–110)
Creat: 0.98 mg/dL (ref 0.60–1.35)
GFR, Est African American: >89 mL/min (ref >=60)
Glucose, Bld: 259 mg/dL — ABNORMAL HIGH (ref 65–99)
POTASSIUM: 4.6 mmol/L (ref 3.5–5.3)
SODIUM: 135 mmol/L (ref 135–146)
TOTAL PROTEIN: 7.8 g/dL (ref 6.1–8.1)
Total Bilirubin: 0.3 mg/dL (ref 0.2–1.2)

## 2014-10-21 LAB — LIPID PANEL
CHOLESTEROL: 149 mg/dL (ref 125–200)
HDL: 72 mg/dL (ref >=40)
LDL Cholesterol: 48 mg/dL (ref <130)
TRIGLYCERIDES: 145 mg/dL (ref <150)
Total CHOL/HDL Ratio: 2.1 Ratio (ref <=5.0)
VLDL: 29 mg/dL (ref <30)

## 2014-10-21 LAB — HEMOGLOBIN A1C
HEMOGLOBIN A1C: 9 % — AB (ref <5.7)
MEAN PLASMA GLUCOSE: 212 mg/dL — AB (ref <117)

## 2014-10-22 ENCOUNTER — Telehealth: Payer: Self-pay

## 2014-10-22 DIAGNOSIS — Z21 Asymptomatic human immunodeficiency virus [HIV] infection status: Secondary | ICD-10-CM

## 2014-10-22 LAB — HIV-1 RNA ULTRAQUANT REFLEX TO GENTYP+
HIV 1 RNA QUANT: 8832 {copies}/mL — AB (ref <20)
HIV-1 RNA QUANT, LOG: 3.95 {Log} — AB (ref <1.30)

## 2014-10-22 LAB — HEPATITIS A ANTIBODY, TOTAL: HEP A TOTAL AB: NONREACTIVE

## 2014-10-22 LAB — HEPATITIS B SURFACE ANTIBODY,QUALITATIVE: HEP B S AB: POSITIVE — AB

## 2014-10-22 LAB — HEPATITIS B SURFACE ANTIGEN: Hepatitis B Surface Ag: NEGATIVE

## 2014-10-22 LAB — T-HELPER CELLS (CD4) COUNT (NOT AT ARMC)
CD4 T CELL HELPER: 20 % — AB (ref 33–55)
CD4 T Cell Abs: 330 /uL — ABNORMAL LOW (ref 400–2700)

## 2014-10-22 LAB — HEPATITIS C ANTIBODY: HCV Ab: NEGATIVE

## 2014-10-22 LAB — HEPATITIS B CORE ANTIBODY, TOTAL: HEP B C TOTAL AB: NONREACTIVE

## 2014-10-22 LAB — RPR

## 2014-10-23 LAB — URINALYSIS
BILIRUBIN URINE: NEGATIVE
HGB URINE DIPSTICK: NEGATIVE
KETONES UR: NEGATIVE
Leukocytes, UA: NEGATIVE
Nitrite: NEGATIVE
Specific Gravity, Urine: 1.028 (ref 1.001–1.035)
pH: 6.5 (ref 5.0–8.0)

## 2014-10-23 LAB — QUANTIFERON TB GOLD ASSAY (BLOOD)
Interferon Gamma Release Assay: NEGATIVE
Mitogen value: 5.71 IU/mL
Quantiferon Nil Value: 0.06 IU/mL
Quantiferon Tb Ag Minus Nil Value: -0.01 IU/mL
TB Ag value: 0.05 IU/mL

## 2014-10-23 LAB — URINE CYTOLOGY ANCILLARY ONLY
CHLAMYDIA, DNA PROBE: NEGATIVE
NEISSERIA GONORRHEA: NEGATIVE

## 2014-10-24 NOTE — Progress Notes (Signed)
Patient was referred by DIS when he recently tested positive for HIV.  He is currently homeless and staying at the [facility]. He is very distraught and tearful to the point he can not speak.  He feels" everything bad is happening and it keeps getting worse".  I was not able to console him and reached out to our counselor Bernette Redbird for assistance . I think he would also benefit from [facility] or [facility] due to multiple needs.  He is diabetic and has not been able to purchase supplies so he has not been checking his blood sugar,  dosing insulin when available and using random unit doses.  He will need assistance with primary care and diabetes management.  He will need to apply for financial assistance through the [place] for discounts.    Recommendation made to see [facility] ASAP. Patient says he will think about it.   Laurell Josephs, RN

## 2014-10-27 NOTE — Telephone Encounter (Signed)
Patient called stating he is need of financial assiatance with Novolog 70/30. He was advised to come to [facility] to see our patient assistance counselor to see if it is available from the drug company.  He has agreed and will come on Wednesday Morning.   Laurell Josephs, RN

## 2014-10-29 LAB — HLA B*5701: HLA-B*5701 w/rflx HLA-B High: NEGATIVE

## 2014-10-31 LAB — HIV-1 GENOTYPR PLUS

## 2014-11-11 ENCOUNTER — Encounter: Payer: Self-pay | Admitting: Internal Medicine

## 2014-11-11 ENCOUNTER — Ambulatory Visit (INDEPENDENT_AMBULATORY_CARE_PROVIDER_SITE_OTHER): Payer: Self-pay | Admitting: Internal Medicine

## 2014-11-11 ENCOUNTER — Other Ambulatory Visit: Payer: Self-pay | Admitting: Pharmacist

## 2014-11-11 ENCOUNTER — Other Ambulatory Visit: Payer: Self-pay | Admitting: *Deleted

## 2014-11-11 VITALS — BP 149/96 | HR 119 | Temp 98.5°F | Ht 73.0 in | Wt 211.0 lb

## 2014-11-11 DIAGNOSIS — Z23 Encounter for immunization: Secondary | ICD-10-CM

## 2014-11-11 DIAGNOSIS — E109 Type 1 diabetes mellitus without complications: Secondary | ICD-10-CM

## 2014-11-11 DIAGNOSIS — Z21 Asymptomatic human immunodeficiency virus [HIV] infection status: Secondary | ICD-10-CM | POA: Insufficient documentation

## 2014-11-11 DIAGNOSIS — B20 Human immunodeficiency virus [HIV] disease: Secondary | ICD-10-CM

## 2014-11-11 MED ORDER — INSULIN ASPART PROT & ASPART (70-30 MIX) 100 UNIT/ML ~~LOC~~ SUSP: SUBCUTANEOUS | Status: DC

## 2014-11-11 MED ORDER — ELVITEG-COBIC-EMTRICIT-TENOFAF 150-150-200-10 MG PO TABS: 1.0000 | ORAL_TABLET | Freq: Every day | ORAL | Status: DC

## 2014-11-11 NOTE — Progress Notes (Unsigned)
Patient ID: [name], male   DOB: 09/18/1985, 29 y.o.   MRN: [id number] I met with [name] today for the second time after staff told me he had been tearful.  He was not tearful when I met with him and he said he was upset coming back in this place because it reminded him of being diagnosed.  He said he is working now at a [employer] and that is helping him deal with his emotions better.  He mentioned having issues with high blood pressure.  I explained about "fight or flight" and taught him breathing exercises to reduce anxiety.  Plan to meet with him again when scheduled. Curley Spice, LCSW

## 2014-11-11 NOTE — Progress Notes (Signed)
Patient ID: [name], male    DOB: 1986/02/27, 29 y.o.   MRN: [id number]  HPI:   Here to establish care as a new patient.  Diagnosed with HV in July.  Does not remember any previous negative test.  Struggling with acceptance and tearful.  He endorses MSM.  No weight loss, no diarrhea.  Has Type 1 diabetes and uses insulin.  Currently living in a homeless shelter and just got a job.    Past Medical History  Diagnosis Date  . Asthma     no prior hospitalizations, intubations  . Diabetes mellitus     Type I    Prior to Admission medications   Medication Sig Start Date End Date Taking? Authorizing Provider  Blood Glucose Monitoring Suppl (WAVESENSE AMP) W/DEVICE KIT 1 kit by Does not apply route once. 12/19/12  Yes Hester Mates, MD  glucose blood (WAVESENSE PRESTO) test strip Use to check blood sugar 3 times per day.  Dx code: 250.01 12/19/12  Yes Hester Mates, MD  Insulin Syringe-Needle U-100 (INSULIN SYRINGE 1CC/31GX5/16") 31G X 5/16" 1 ML MISC Use 3 times per day to inject insulin subcutaneous.  Dx code: 250.01 12/19/12  Yes Hester Mates, MD  albuterol (PROVENTIL HFA;VENTOLIN HFA) 108 (90 BASE) MCG/ACT inhaler Inhale 2 puffs into the lungs every 6 (six) hours as needed for wheezing.    Historical Provider, MD  elvitegravir-cobicistat-emtricitabine-tenofovir (GENVOYA) 150-150-200-10 MG TABS tablet Take 1 tablet by mouth daily. 11/11/14   Thayer Headings, MD  insulin aspart protamine- aspart (NOVOLOG MIX 70/30) (70-30) 100 UNIT/ML injection Injects 45 units in the morning and 35 units in the evening 11/11/14   Thayer Headings, MD  promethazine (PHENERGAN) 25 MG tablet Take 1 tablet (25 mg total) by mouth every 6 (six) hours as needed for nausea (Can take with Naproxen for headache). Patient not taking: Reported on 10/24/2014 12/21/12   Hester Mates, MD    Allergies  Allergen Reactions  . Shellfish Allergy Anaphylaxis and Hives    Social History  Substance Use Topics  . Smoking status:  Never Smoker   . Smokeless tobacco: Never Used  . Alcohol Use: 0.0 - 0.6 oz/week    0-1 Standard drinks or equivalent per week    Family History  Problem Relation Age of Onset  . Thyroid disease Mother   . Diabetes Mother   . Breast cancer Maternal Grandmother   . Diabetes Maternal Grandmother     Review of Systems A comprehensive review of systems was negative except for: depression.  denies SI, HI.     Filed Vitals:   11/11/14 0916  BP: 149/96  Pulse: 119  Temp: 98.5 F (36.9 C)   Gen:in no apparent distress and alert HEENT: anicteric Cor RRR and No murmurs Lungs: clear GI: Bowel sounds are normal, liver is not enlarged, spleen is not enlarged Ext: peripheral pulses normal, no pedal edema, no clubbing or cyanosis SKin: negative for - jaundice, spider hemangioma, telangiectasia, palmar erythema, ecchymosis and atrophy LAD some bilateral periauricular lad Neuro: non focal  Lab Results  Component Value Date   HIV1RNAQUANT 8832* 10/21/2014   No components found for: HIV1GENOTYPRPLUS No components found for: THELPERCELL  Assessment: new HIV patient.  Struggling with diagnosis but does feel he is in a good place to start treatment.  Will:  Plan: 1) start Genvoya 2) repeat labs in 3-4 weeks 3) dental appt for tomorrow 4) counselor did see him and will follow up 5) will  refill insulin 6) information for PCP

## 2014-11-12 ENCOUNTER — Other Ambulatory Visit: Payer: Self-pay | Admitting: Pharmacist

## 2014-11-12 ENCOUNTER — Ambulatory Visit (INDEPENDENT_AMBULATORY_CARE_PROVIDER_SITE_OTHER): Payer: No Typology Code available for payment source | Admitting: Family Medicine

## 2014-11-12 ENCOUNTER — Encounter: Payer: Self-pay | Admitting: Family Medicine

## 2014-11-12 VITALS — BP 131/78 | HR 75 | Temp 98.0°F | Resp 16 | Ht 72.0 in | Wt 208.0 lb

## 2014-11-12 DIAGNOSIS — F17200 Nicotine dependence, unspecified, uncomplicated: Secondary | ICD-10-CM | POA: Insufficient documentation

## 2014-11-12 DIAGNOSIS — B20 Human immunodeficiency virus [HIV] disease: Secondary | ICD-10-CM

## 2014-11-12 DIAGNOSIS — E109 Type 1 diabetes mellitus without complications: Secondary | ICD-10-CM

## 2014-11-12 DIAGNOSIS — J4520 Mild intermittent asthma, uncomplicated: Secondary | ICD-10-CM

## 2014-11-12 LAB — POCT URINALYSIS DIP (DEVICE)
GLUCOSE, UA: NEGATIVE mg/dL
KETONES UR: NEGATIVE mg/dL
Leukocytes, UA: NEGATIVE
Nitrite: NEGATIVE
PROTEIN: 100 mg/dL — AB
Urobilinogen, UA: 0.2 mg/dL (ref 0.0–1.0)
pH: 5.5 (ref 5.0–8.0)

## 2014-11-12 LAB — HEMOGLOBIN A1C
Hgb A1c MFr Bld: 9.2 % — ABNORMAL HIGH (ref <5.7)
Mean Plasma Glucose: 217 mg/dL — ABNORMAL HIGH (ref <117)

## 2014-11-12 LAB — GLUCOSE, CAPILLARY: Glucose-Capillary: 114 mg/dL — ABNORMAL HIGH (ref 65–99)

## 2014-11-12 MED ORDER — ACCU-CHEK SOFT TOUCH LANCETS MISC: Status: DC

## 2014-11-12 MED ORDER — INSULIN ASPART PROT & ASPART (70-30 MIX) 100 UNIT/ML ~~LOC~~ SUSP: SUBCUTANEOUS | Status: DC

## 2014-11-12 MED ORDER — GLUCOSE BLOOD VI STRP: ORAL_STRIP | Status: DC

## 2014-11-12 MED ORDER — TRUEPLUS LANCETS 28G MISC: Status: DC

## 2014-11-12 MED ORDER — TRUE METRIX AIR GLUCOSE METER DEVI: 1.0000 | Freq: Three times a day (TID) | Status: DC

## 2014-11-12 MED ORDER — ALBUTEROL SULFATE HFA 108 (90 BASE) MCG/ACT IN AERS: 2.0000 | INHALATION_SPRAY | Freq: Four times a day (QID) | RESPIRATORY_TRACT | Status: DC | PRN

## 2014-11-12 NOTE — Progress Notes (Signed)
Subjective:    Patient ID: [name], male    DOB: 09-May-1985, 29 y.o.   MRN: [id number]  HPI  [name], a 29 year old male with a history of diabetes type 1, HIV, and asthma presents to establish care. Patient states that he is currently living in a homeless shelter and recently found employment.   Patient has a history  of Type 1 diabetes mellitus. The initial diagnosis of diabetes was made as a juvenile.   His clinical course has fluctuated. Insulin dosage has been inconsistent due to fact [name] has not been followed by a primary provider or endocrinologist. He states that he has not been following a regular diet or checking his blood sugars. He currently does not have a glucometer.  Associated symptoms of hyperglycemia or hypoglycemia have been negative. He is currently taking a Novalog 70/30 mix 50 units in the am and 35 units in the pm.  [name] is followed by Dr. Staci Righter at [facility] for HIV. He was previously diagnosed in July 2016. Patient states that he is to start medication regimen on today. Patient is very tearful and is having difficulty accepting recent diagnosis. Patient denies weight changes, nausea, vomiting, diarrhea, or skin changes.  Past Medical History  Diagnosis Date  . Asthma     no prior hospitalizations, intubations  . Diabetes mellitus     Type I   Social History   Social History  . Marital Status: Single    Spouse Name: N/A  . Number of Children: N/A  . Years of Education: N/A   Occupational History  . Not on file.   Social History Main Topics  . Smoking status: Current Every Day Smoker -- 0.25 packs/day  . Smokeless tobacco: Never Used  . Alcohol Use: 1.8 - 2.4 oz/week    0-1 Standard drinks or equivalent, 1 Cans of beer, 2 Shots of liquor per week  . Drug Use: 2.00 per week    Special: Marijuana     Comment: daily  . Sexual Activity:    Partners: Male    Birth Control/ Protection: Condom   Other  Topics Concern  . Not on file   Social History Narrative   Allergies  Allergen Reactions  . Shellfish Allergy Anaphylaxis and Hives   Immunization History  Administered Date(s) Administered  . Influenza,inj,Quad PF,36+ Mos 12/02/2012, 11/11/2014  . PPD Test 10/21/2014  . Pneumococcal Polysaccharide-23 10/05/2010  . Tdap 06/30/2012   Review of Systems  Constitutional: Positive for fatigue. Negative for fever.  HENT: Negative.  Negative for dental problem.   Eyes: Negative.   Respiratory: Negative.  Negative for apnea, cough, choking, chest tightness, shortness of breath and wheezing.   Cardiovascular: Negative.  Negative for chest pain, palpitations and leg swelling.  Gastrointestinal: Negative.  Negative for constipation and blood in stool.  Endocrine: Negative.  Negative for polydipsia, polyphagia and polyuria.  Genitourinary: Negative.   Musculoskeletal: Negative.  Negative for myalgias.  Skin: Negative.  Negative for rash and wound.  Allergic/Immunologic: Negative.   Neurological: Negative.  Negative for numbness.  Hematological: Negative.   Psychiatric/Behavioral: Negative.  Negative for suicidal ideas and sleep disturbance.       Objective:   Physical Exam  Constitutional: He is oriented to person, place, and time. Vital signs are normal. He appears well-developed and well-nourished.  HENT:  Head: Normocephalic and atraumatic.  Right Ear: Hearing, tympanic membrane and external ear normal.  Left Ear: Hearing, tympanic  membrane, external ear and ear canal normal.  Nose: Nose normal.  Mouth/Throat: Uvula is midline and oropharynx is clear and moist.  Eyes: Conjunctivae, EOM and lids are normal. Pupils are equal, round, and reactive to light.  Neck: Trachea normal and normal range of motion. Neck supple.  Cardiovascular: Normal rate, regular rhythm, normal heart sounds, intact distal pulses and normal pulses.   Pulmonary/Chest: Effort normal and breath sounds normal.   Abdominal: Soft. Normal appearance and bowel sounds are normal.  Musculoskeletal: Normal range of motion.  Lymphadenopathy:       Head (right side): No submental adenopathy present.       Head (left side): No submental adenopathy present.  Neurological: He is alert and oriented to person, place, and time. He has normal reflexes. No cranial nerve deficit or sensory deficit.  Skin: Skin is warm, dry and intact.  Psychiatric: He has a normal mood and affect. His speech is normal and behavior is normal. Judgment and thought content normal.      BP 131/78 mmHg  Pulse 75  Temp(Src) 98 F (36.7 C) (Oral)  Resp 16  Ht 6' (1.829 m)  Wt 208 lb (94.348 kg)  BMI 28.20 kg/m2 Assessment & Plan:  1. Type 1 diabetes mellitus without complication  [name] has not been followed by a primary provider in a number of years. Checked urinalysis, no glucosuria or ketonuria CBG= 114 Reviewed previous labs, electrolytes within normal range Will check hemoglobin a1C Previous eye examination in June 2016 Educated on diabetic foot care - POCT urinalysis dipstick - Hemoglobin A1c - insulin aspart protamine- aspart (NOVOLOG MIX 70/30) (70-30) 100 UNIT/ML injection; Injects 50 units in the morning and 35 units in the evening  Dispense: 30 mL; Refill: 5 - Blood Glucose Monitoring Suppl (TRUE METRIX AIR GLUCOSE METER) DEVI; 1 each by Does not apply route 4 (four) times daily -  with meals and at bedtime.  Dispense: 1 Device; Refill: 0 - glucose blood test strip; Use as instructed  Dispense: 100 each; Refill: 12  2. Asthma, mild intermittent, uncomplicated Patient denies any recent asthma exacerbations. He states that he does not have a rescue inhaler.  - albuterol (PROVENTIL HFA;VENTOLIN HFA) 108 (90 BASE) MCG/ACT inhaler; Inhale 2 puffs into the lungs every 6 (six) hours as needed for wheezing.  Dispense: 1 Inhaler; Refill: 1  3. Human immunodeficiency virus (HIV) disease Reviewed previous labs, patient to  follow up with Dr. Luciana Axe as scheduled.    4. Tobacco dependence Smoking cessation instruction/counseling given:  counseled patient on the dangers of tobacco use, advised patient to stop smoking, and reviewed strategies to maximize success  RTC: 1 month for diabetes type I  Massie Maroon, FNP

## 2014-11-12 NOTE — Patient Instructions (Addendum)
Diabetes and Sick Day Management Blood sugar (glucose) can be more difficult to control when you are sick. Colds, fever, flu, nausea, vomiting, and diarrhea are all examples of common illnesses that can cause problems for people with diabetes. Loss of body fluids (dehydration) from fever, vomiting, diarrhea, infection, and the stress of a sickness can all cause blood glucose levels to increase. Because of this, it is very important to take your diabetes medicines and to eat some form of carbohydrate food when you are sick. Liquid or soft foods are often tolerated, and they help to replace fluids. HOME CARE INSTRUCTIONS These main guidelines are intended for managing a short-term (24 hours or less) sickness:  Take your usual dose of insulin or oral diabetes medicine. An exception would be if you take any form of metformin. If you cannot eat or drink, you can become dehydrated and should not take this medicine.  Continue to take your insulin even if you are unable to eat solid foods or are vomiting. Your insulin dose may stay the same, or it may need to be increased when you are sick.  You will need to test your blood glucose more often, generally every 2-4 hours. If you have type 1 diabetes, test your urine for ketones every 4 hours. If you have type 2 diabetes, test your urine for ketones as directed by your health care provider.  Eat some form of food that contains carbohydrates. The carbohydrates can be in solid or liquid form. You should eat 45-50 g of carbohydrates every 3-4 hours.  Replace fluids if you have a fever, vomit, or have diarrhea. Ask your health care provider for specific rehydration instructions.  Watch carefully for the signs of ketoacidosis if you have type 1 diabetes. Call your health care provider if any of the following symptoms are present, especially in children:  Moderate to large ketones in the urine along with a high blood glucose level.  Severe  nausea.  Vomiting.  Diarrhea.  Abdominal pain.  Rapid breathing.  Drink extra liquids that do not contain sugar such as water.  Be careful with over-the-counter medicines. Read the labels. They may contain sugar or types of sugars that can increase your blood glucose level. Food Choices for Illness All of the food choices below contain about 15 g of carbohydrates. Plan ahead and keep some of these foods around.    to  cup carbonated beverage containing sugar. Carbonated beverages will usually be better tolerated if they are opened and left at room temperature for a few minutes.   of a twin frozen ice pop.   cup regular gelatin.   cup juice.   cup ice cream or frozen yogurt.   cup cooked cereal.   cup sherbet.  1 cup clear broth or soup.  1 cup cream soup.   cup regular custard.   cup regular pudding.  1 cup sports drink.  1 cup plain yogurt.  1 slice toast.  6 squares saltine crackers.  5 vanilla wafers. SEEK MEDICAL CARE IF:   You are unable to drink fluids, even small amounts.  You have nausea and vomiting for more than 6 hours.  You have diarrhea for more than 6 hours.  Your blood glucose level is more than 240 mg/dL, even with additional insulin.  There is a change in mental status.  You develop an additional serious sickness.  You have been sick for 2 days and are not getting better.  You have a fever. SEEK IMMEDIATE   MEDICAL CARE IF:  You have difficulty breathing.  You have moderate to large ketone levels. MAKE SURE YOU:  Understand these instructions.  Will watch your condition.  Will get help right away if you are not doing well or get worse. Document Released: 02/17/2003 Document Revised: 07/01/2013 Document Reviewed: 07/24/2012 [facility] Patient Information 2015 [place], [place]. This information is not intended to replace advice given to you by your health care provider. Make sure you discuss any questions you have with  your health care provider. Diabetes Mellitus and Food It is important for you to manage your blood sugar (glucose) level. Your blood glucose level can be greatly affected by what you eat. Eating healthier foods in the appropriate amounts throughout the day at about the same time each day will help you control your blood glucose level. It can also help slow or prevent worsening of your diabetes mellitus. Healthy eating may even help you improve the level of your blood pressure and reach or maintain a healthy weight.  HOW CAN FOOD AFFECT ME? Carbohydrates Carbohydrates affect your blood glucose level more than any other type of food. Your dietitian will help you determine how many carbohydrates to eat at each meal and teach you how to count carbohydrates. Counting carbohydrates is important to keep your blood glucose at a healthy level, especially if you are using insulin or taking certain medicines for diabetes mellitus. Alcohol Alcohol can cause sudden decreases in blood glucose (hypoglycemia), especially if you use insulin or take certain medicines for diabetes mellitus. Hypoglycemia can be a life-threatening condition. Symptoms of hypoglycemia (sleepiness, dizziness, and disorientation) are similar to symptoms of having too much alcohol.  If your health care provider has given you approval to drink alcohol, do so in moderation and use the following guidelines:  Women should not have more than one drink per day, and men should not have more than two drinks per day. One drink is equal to:  12 oz of beer.  5 oz of wine.  1 oz of hard liquor.  Do not drink on an empty stomach.  Keep yourself hydrated. Have water, diet soda, or unsweetened iced tea.  Regular soda, juice, and other mixers might contain a lot of carbohydrates and should be counted. WHAT FOODS ARE NOT RECOMMENDED? As you make food choices, it is important to remember that all foods are not the same. Some foods have fewer nutrients  per serving than other foods, even though they might have the same number of calories or carbohydrates. It is difficult to get your body what it needs when you eat foods with fewer nutrients. Examples of foods that you should avoid that are high in calories and carbohydrates but low in nutrients include:  Trans fats (most processed foods list trans fats on the Nutrition Facts label).  Regular soda.  Juice.  Candy.  Sweets, such as cake, pie, doughnuts, and cookies.  Fried foods. WHAT FOODS CAN I EAT? Have nutrient-rich foods, which will nourish your body and keep you healthy. The food you should eat also will depend on several factors, including:  The calories you need.  The medicines you take.  Your weight.  Your blood glucose level.  Your blood pressure level.  Your cholesterol level. You also should eat a variety of foods, including:  Protein, such as meat, poultry, fish, tofu, nuts, and seeds (lean animal proteins are best).  Fruits.  Vegetables.  Dairy products, such as milk, cheese, and yogurt (low fat is best).  Breads,  grains, pasta, cereal, rice, and beans.  Fats such as olive oil, trans fat-free margarine, canola oil, avocado, and olives. DOES EVERYONE WITH DIABETES MELLITUS HAVE THE SAME MEAL PLAN? Because every person with diabetes mellitus is different, there is not one meal plan that works for everyone. It is very important that you meet with a dietitian who will help you create a meal plan that is just right for you. Document Released: 11/11/2004 Document Revised: 02/19/2013 Document Reviewed: 01/11/2013 [facility] Patient Information 2015 [place], [place]. This information is not intended to replace advice given to you by your health care provider. Make sure you discuss any questions you have with your health care provider. Diabetes and Sick Day Management Blood sugar (glucose) can be more difficult to control when you are sick. Colds, fever, flu, nausea,  vomiting, and diarrhea are all examples of common illnesses that can cause problems for people with diabetes. Loss of body fluids (dehydration) from fever, vomiting, diarrhea, infection, and the stress of a sickness can all cause blood glucose levels to increase. Because of this, it is very important to take your diabetes medicines and to eat some form of carbohydrate food when you are sick. Liquid or soft foods are often tolerated, and they help to replace fluids. HOME CARE INSTRUCTIONS These main guidelines are intended for managing a short-term (24 hours or less) sickness:  Take your usual dose of insulin or oral diabetes medicine. An exception would be if you take any form of metformin. If you cannot eat or drink, you can become dehydrated and should not take this medicine.  Continue to take your insulin even if you are unable to eat solid foods or are vomiting. Your insulin dose may stay the same, or it may need to be increased when you are sick.  You will need to test your blood glucose more often, generally every 2-4 hours. If you have type 1 diabetes, test your urine for ketones every 4 hours. If you have type 2 diabetes, test your urine for ketones as directed by your health care provider.  Eat some form of food that contains carbohydrates. The carbohydrates can be in solid or liquid form. You should eat 45-50 g of carbohydrates every 3-4 hours.  Replace fluids if you have a fever, vomit, or have diarrhea. Ask your health care provider for specific rehydration instructions.  Watch carefully for the signs of ketoacidosis if you have type 1 diabetes. Call your health care provider if any of the following symptoms are present, especially in children:  Moderate to large ketones in the urine along with a high blood glucose level.  Severe nausea.  Vomiting.  Diarrhea.  Abdominal pain.  Rapid breathing.  Drink extra liquids that do not contain sugar such as water.  Be careful with  over-the-counter medicines. Read the labels. They may contain sugar or types of sugars that can increase your blood glucose level. Food Choices for Illness All of the food choices below contain about 15 g of carbohydrates. Plan ahead and keep some of these foods around.    to  cup carbonated beverage containing sugar. Carbonated beverages will usually be better tolerated if they are opened and left at room temperature for a few minutes.   of a twin frozen ice pop.   cup regular gelatin.   cup juice.   cup ice cream or frozen yogurt.   cup cooked cereal.   cup sherbet.  1 cup clear broth or soup.  1 cup cream soup.  cup regular custard.   cup regular pudding.  1 cup sports drink.  1 cup plain yogurt.  1 slice toast.  6 squares saltine crackers.  5 vanilla wafers. SEEK MEDICAL CARE IF:   You are unable to drink fluids, even small amounts.  You have nausea and vomiting for more than 6 hours.  You have diarrhea for more than 6 hours.  Your blood glucose level is more than 240 mg/dL, even with additional insulin.  There is a change in mental status.  You develop an additional serious sickness.  You have been sick for 2 days and are not getting better.  You have a fever. SEEK IMMEDIATE MEDICAL CARE IF:  You have difficulty breathing.  You have moderate to large ketone levels. MAKE SURE YOU:  Understand these instructions.  Will watch your condition.  Will get help right away if you are not doing well or get worse. Document Released: 02/17/2003 Document Revised: 07/01/2013 Document Reviewed: 07/24/2012 [facility] Patient Information 2015 [place], [place]. This information is not intended to replace advice given to you by your health care provider. Make sure you discuss any questions you have with your health care provider. Basic Carbohydrate Counting for Diabetes Mellitus Carbohydrate counting is a method for keeping track of the amount of  carbohydrates you eat. Eating carbohydrates naturally increases the level of sugar (glucose) in your blood, so it is important for you to know the amount that is okay for you to have in every meal. Carbohydrate counting helps keep the level of glucose in your blood within normal limits. The amount of carbohydrates allowed is different for every person. A dietitian can help you calculate the amount that is right for you. Once you know the amount of carbohydrates you can have, you can count the carbohydrates in the foods you want to eat. Carbohydrates are found in the following foods:  Grains, such as breads and cereals.  Dried beans and soy products.  Starchy vegetables, such as potatoes, peas, and corn.  Fruit and fruit juices.  Milk and yogurt.  Sweets and snack foods, such as cake, cookies, candy, chips, soft drinks, and fruit drinks. CARBOHYDRATE COUNTING There are two ways to count the carbohydrates in your food. You can use either of the methods or a combination of both. Reading the "Nutrition Facts" on Packaged Food The "Nutrition Facts" is an area that is included on the labels of almost all packaged food and beverages in the [place]. It includes the serving size of that food or beverage and information about the nutrients in each serving of the food, including the grams (g) of carbohydrate per serving.  Decide the number of servings of this food or beverage that you will be able to eat or drink. Multiply that number of servings by the number of grams of carbohydrate that is listed on the label for that serving. The total will be the amount of carbohydrates you will be having when you eat or drink this food or beverage. Learning Standard Serving Sizes of Food When you eat food that is not packaged or does not include "Nutrition Facts" on the label, you need to measure the servings in order to count the amount of carbohydrates.A serving of most carbohydrate-rich foods contains about  15 g of carbohydrates. The following list includes serving sizes of carbohydrate-rich foods that provide 15 g ofcarbohydrate per serving:   1 slice of bread (1 oz) or 1 six-inch tortilla.    of a hamburger bun or  English muffin.  4-6 crackers.   cup unsweetened dry cereal.    cup hot cereal.   cup rice or pasta.    cup mashed potatoes or  of a large baked potato.  1 cup fresh fruit or one small piece of fruit.    cup canned or frozen fruit or fruit juice.  1 cup milk.   cup plain fat-free yogurt or yogurt sweetened with artificial sweeteners.   cup cooked dried beans or starchy vegetable, such as peas, corn, or potatoes.  Decide the number of standard-size servings that you will eat. Multiply that number of servings by 15 (the grams of carbohydrates in that serving). For example, if you eat 2 cups of strawberries, you will have eaten 2 servings and 30 g of carbohydrates (2 servings x 15 g = 30 g). For foods such as soups and casseroles, in which more than one food is mixed in, you will need to count the carbohydrates in each food that is included. EXAMPLE OF CARBOHYDRATE COUNTING Sample Dinner  3 oz chicken breast.   cup of brown rice.   cup of corn.  1 cup milk.   1 cup strawberries with sugar-free whipped topping.  Carbohydrate Calculation Step 1: Identify the foods that contain carbohydrates:   Rice.   Corn.   Milk.   Strawberries. Step 2:Calculate the number of servings eaten of each:   2 servings of rice.   1 serving of corn.   1 serving of milk.   1 serving of strawberries. Step 3: Multiply each of those number of servings by 15 g:   2 servings of rice x 15 g = 30 g.   1 serving of corn x 15 g = 15 g.   1 serving of milk x 15 g = 15 g.   1 serving of strawberries x 15 g = 15 g. Step 4: Add together all of the amounts to find the total grams of carbohydrates eaten: 30 g + 15 g + 15 g + 15 g = 75 g. Document  Released: 02/14/2005 Document Revised: 07/01/2013 Document Reviewed: 01/11/2013 [facility] Patient Information 2015 [place], [place]. This information is not intended to replace advice given to you by your health care provider. Make sure you discuss any questions you have with your health care provider.

## 2014-11-13 ENCOUNTER — Telehealth: Payer: Self-pay | Admitting: Family Medicine

## 2014-11-13 NOTE — Telephone Encounter (Signed)
Reviewed labs, hemoglobin a1C remains elevated at 9.2. Patient is to continue Novalog 70/30 mix 50 units in the am and 35 units pm. Continue a low fat, low carbohydrate diet divided over 5-6 small meals. Check blood sugars as discussed during office visit. Follow up in 1 month and bring glucometer to next office visit.   Massie Maroon, FNP

## 2014-11-13 NOTE — Telephone Encounter (Signed)
Left message for patient about elevated hga1c, to continue insulin as prescribed, do low car/low fat diet, and check blood sugar as discussed during visit. Also Asked patient to return call to schedule 1 month follow up and to return to clinic to pick up blood sugar meter. Thanks!

## 2014-11-17 ENCOUNTER — Telehealth: Payer: Self-pay | Admitting: *Deleted

## 2014-11-17 NOTE — Telephone Encounter (Signed)
Left message notifying patient that his medication Darrin Luis) has arrived at the clinic from [facility]. Asked patient to call or come by to pick it up. Andree Coss, RN

## 2014-11-26 ENCOUNTER — Emergency Department (HOSPITAL_COMMUNITY): Payer: No Typology Code available for payment source

## 2014-11-26 ENCOUNTER — Encounter (HOSPITAL_COMMUNITY): Payer: Self-pay

## 2014-11-26 ENCOUNTER — Emergency Department (HOSPITAL_COMMUNITY)
Admission: EM | Admit: 2014-11-26 | Discharge: 2014-11-26 | Disposition: A | Discharge status: Home / Self Care | Payer: No Typology Code available for payment source | Attending: Emergency Medicine | Admitting: Emergency Medicine

## 2014-11-26 DIAGNOSIS — R531 Weakness: Secondary | ICD-10-CM | POA: Insufficient documentation

## 2014-11-26 DIAGNOSIS — R112 Nausea with vomiting, unspecified: Secondary | ICD-10-CM

## 2014-11-26 DIAGNOSIS — Z79899 Other long term (current) drug therapy: Secondary | ICD-10-CM | POA: Insufficient documentation

## 2014-11-26 DIAGNOSIS — J45909 Unspecified asthma, uncomplicated: Secondary | ICD-10-CM | POA: Insufficient documentation

## 2014-11-26 DIAGNOSIS — Z794 Long term (current) use of insulin: Secondary | ICD-10-CM | POA: Insufficient documentation

## 2014-11-26 DIAGNOSIS — Z720 Tobacco use: Secondary | ICD-10-CM | POA: Insufficient documentation

## 2014-11-26 DIAGNOSIS — E109 Type 1 diabetes mellitus without complications: Secondary | ICD-10-CM | POA: Insufficient documentation

## 2014-11-26 LAB — CBC
HCT: 47.4 % (ref 39.0–52.0)
HEMOGLOBIN: 16.7 g/dL (ref 13.0–17.0)
MCH: 30.4 pg (ref 26.0–34.0)
MCHC: 35.2 g/dL (ref 30.0–36.0)
MCV: 86.2 fL (ref 78.0–100.0)
PLATELETS: 281 10*3/uL (ref 150–400)
RBC: 5.5 MIL/uL (ref 4.22–5.81)
RDW: 14.5 % (ref 11.5–15.5)
WBC: 9.4 10*3/uL (ref 4.0–10.5)

## 2014-11-26 LAB — COMPREHENSIVE METABOLIC PANEL
ALBUMIN: 3.7 g/dL (ref 3.5–5.0)
ALK PHOS: 42 U/L (ref 38–126)
ALT: 22 U/L (ref 17–63)
AST: 32 U/L (ref 15–41)
Anion gap: 6 (ref 5–15)
BUN: 17 mg/dL (ref 6–20)
CHLORIDE: 103 mmol/L (ref 101–111)
CO2: 26 mmol/L (ref 22–32)
CREATININE: 0.94 mg/dL (ref 0.61–1.24)
Calcium: 9.2 mg/dL (ref 8.9–10.3)
GFR calc non Af Amer: >60 mL/min (ref >60)
GLUCOSE: 124 mg/dL — AB (ref 65–99)
Potassium: 3.8 mmol/L (ref 3.5–5.1)
SODIUM: 135 mmol/L (ref 135–145)
Total Bilirubin: 0.8 mg/dL (ref 0.3–1.2)
Total Protein: 7.9 g/dL (ref 6.5–8.1)

## 2014-11-26 LAB — URINALYSIS, ROUTINE W REFLEX MICROSCOPIC
Bilirubin Urine: NEGATIVE
GLUCOSE, UA: NEGATIVE mg/dL
Hgb urine dipstick: NEGATIVE
Ketones, ur: 15 mg/dL — AB
LEUKOCYTES UA: NEGATIVE
Nitrite: NEGATIVE
PROTEIN: 100 mg/dL — AB
SPECIFIC GRAVITY, URINE: 1.024 (ref 1.005–1.030)
Urobilinogen, UA: 0.2 mg/dL (ref 0.0–1.0)
pH: 5.5 (ref 5.0–8.0)

## 2014-11-26 LAB — BLOOD GAS, VENOUS
Acid-Base Excess: 2.2 mmol/L — ABNORMAL HIGH (ref 0.0–2.0)
BICARBONATE: 26.1 meq/L — AB (ref 20.0–24.0)
FIO2: 0.21
O2 Saturation: 85.6 %
PATIENT TEMPERATURE: 98.6
PCO2 VEN: 40.1 mmHg — AB (ref 45.0–50.0)
TCO2: 22 mmol/L (ref 0–100)
pH, Ven: 7.43 — ABNORMAL HIGH (ref 7.250–7.300)
pO2, Ven: 51.7 mmHg — ABNORMAL HIGH (ref 30.0–45.0)

## 2014-11-26 LAB — URINE MICROSCOPIC-ADD ON

## 2014-11-26 LAB — LIPASE, BLOOD: Lipase: 17 U/L — ABNORMAL LOW (ref 22–51)

## 2014-11-26 LAB — I-STAT CG4 LACTIC ACID, ED
LACTIC ACID, VENOUS: 0.84 mmol/L (ref 0.5–2.0)
Lactic Acid, Venous: 0.31 mmol/L — ABNORMAL LOW (ref 0.5–2.0)

## 2014-11-26 MED ORDER — SODIUM CHLORIDE 0.9 % IV BOLUS (SEPSIS)
1000.0000 mL | Freq: Once | INTRAVENOUS | Status: AC
  Administered 2014-11-26: 1000 mL via INTRAVENOUS

## 2014-11-26 MED ORDER — ONDANSETRON 4 MG PO TBDP: ORAL_TABLET | ORAL | Status: DC

## 2014-11-26 MED ORDER — PROMETHAZINE HCL 25 MG/ML IJ SOLN
25.0000 mg | Freq: Once | INTRAMUSCULAR | Status: AC
  Administered 2014-11-26: 25 mg via INTRAVENOUS
  Filled 2014-11-26: qty 1

## 2014-11-26 NOTE — ED Notes (Signed)
Pt able to ambulate for about 10 ft without assistance.

## 2014-11-26 NOTE — ED Notes (Signed)
Bed: WA08 Expected date:  Expected time:  Means of arrival:  Comments: EMS/weakness 

## 2014-11-26 NOTE — ED Notes (Signed)
md informed pt we needed a urine sample. md asked nurse to follow up with pt. md instructed if pt did not provide urine sample in 5 minutes then pt would have in and out cath performed.  When charge walked in to room, pt had urinal sitting next to him on bed, eating food.

## 2014-11-26 NOTE — ED Notes (Signed)
Patient transported to X-ray 

## 2014-11-26 NOTE — ED Provider Notes (Signed)
CSN: [id number]     Arrival date & time 11/26/14  0840 History   First MD Initiated Contact with Patient 11/26/14 [phone]     Chief Complaint  Patient presents with  . Emesis     (Consider location/radiation/quality/duration/timing/severity/associated sxs/prior Treatment) Patient is a 29 y.o. male presenting with vomiting. The history is provided by the patient.  Emesis Severity:  Moderate Duration:  2 days Timing:  Constant Quality:  Stomach contents Progression:  Worsening Chronicity:  New Relieved by:  Nothing Worsened by:  Nothing tried Associated symptoms: no abdominal pain and no chills   Risk factors: no sick contacts and no suspect food intake   Risk factors comment:  No missed meds   Past Medical History  Diagnosis Date  . Asthma     no prior hospitalizations, intubations  . Diabetes mellitus     Type I   History reviewed. No pertinent past surgical history. Family History  Problem Relation Age of Onset  . Thyroid disease Mother   . Diabetes Mother   . Breast cancer Maternal Grandmother   . Diabetes Maternal Grandmother    Social History  Substance Use Topics  . Smoking status: Current Every Day Smoker -- 0.25 packs/day  . Smokeless tobacco: Never Used  . Alcohol Use: 1.8 - 2.4 oz/week    0-1 Standard drinks or equivalent, 1 Cans of beer, 2 Shots of liquor per week    Review of Systems  Constitutional: Negative for chills and fatigue.  Cardiovascular: Negative for chest pain.  Gastrointestinal: Positive for vomiting. Negative for abdominal pain.  All other systems reviewed and are negative.     Allergies  Shellfish allergy  Home Medications   Prior to Admission medications   Medication Sig Start Date End Date Taking? Authorizing Provider  albuterol (PROVENTIL HFA;VENTOLIN HFA) 108 (90 BASE) MCG/ACT inhaler Inhale 2 puffs into the lungs every 6 (six) hours as needed for wheezing. 11/12/14  Yes Massie Maroon, FNP  amoxicillin (AMOXIL) 500 MG  tablet Take 500 mg by mouth 3 (three) times daily. 11/12/14 11/26/14 Yes Historical Provider, MD  Blood Glucose Monitoring Suppl (TRUE METRIX AIR GLUCOSE METER) DEVI 1 each by Does not apply route 4 (four) times daily -  with meals and at bedtime. 11/12/14  Yes Massie Maroon, FNP  elvitegravir-cobicistat-emtricitabine-tenofovir (GENVOYA) 150-150-200-10 MG TABS tablet Take 1 tablet by mouth daily. 11/11/14  Yes Gardiner Barefoot, MD  glucose blood (TRUE METRIX BLOOD GLUCOSE TEST) test strip Use as instructed 11/12/14  Yes Olugbemiga E Hyman Hopes, MD  insulin aspart protamine- aspart (NOVOLOG MIX 70/30) (70-30) 100 UNIT/ML injection Injects 50 units in the morning and 35 units in the evening Patient taking differently: Inject 35-50 Units into the skin 2 (two) times daily. Injects 50 units in the morning and 35 units in the evening 11/12/14  Yes Massie Maroon, FNP  Insulin Syringe-Needle U-100 (INSULIN SYRINGE 1CC/31GX5/16") 31G X 5/16" 1 ML MISC Use 3 times per day to inject insulin subcutaneous.  Dx code: 250.01 12/19/12  Yes Linward Headland, MD  TRUEPLUS LANCETS 28G MISC USE AS DIRECTED 11/12/14  Yes Quentin Angst, MD   BP 148/88 mmHg  Pulse 81  Temp(Src) 98.1 F (36.7 C) (Oral)  Resp 16  SpO2 100% Physical Exam  Constitutional: He is oriented to person, place, and time. He appears well-developed and well-nourished. No distress.  HENT:  Head: Normocephalic and atraumatic.  Mouth/Throat: No oropharyngeal exudate.  Eyes: EOM are normal. Pupils are equal, round,  and reactive to light.  Neck: Normal range of motion. Neck supple.  Cardiovascular: Normal rate and regular rhythm.  Exam reveals no friction rub.   No murmur heard. Pulmonary/Chest: Effort normal and breath sounds normal. No respiratory distress. He has no wheezes. He has no rales.  Abdominal: Soft. He exhibits no distension. There is no tenderness. There is no rebound.  Musculoskeletal: Normal range of motion. He exhibits no edema.   Neurological: He is alert and oriented to person, place, and time. No cranial nerve deficit. He exhibits normal muscle tone. Coordination normal.  Skin: No rash noted. He is not diaphoretic. No erythema.  Nursing note and vitals reviewed.   ED Course  Procedures (including critical care time) Labs Review Labs Reviewed  BLOOD GAS, VENOUS - Abnormal; Notable for the following:    pH, Ven 7.430 (*)    pCO2, Ven 40.1 (*)    pO2, Ven 51.7 (*)    Bicarbonate 26.1 (*)    Acid-Base Excess 2.2 (*)    All other components within normal limits  CBC  COMPREHENSIVE METABOLIC PANEL  LIPASE, BLOOD  URINALYSIS, ROUTINE W REFLEX MICROSCOPIC (NOT AT [facility])  I-STAT CG4 LACTIC ACID, ED  CBG MONITORING, ED    Imaging Review No results found. I have personally reviewed and evaluated these images and lab results as part of my medical decision-making.   EKG Interpretation   Date/Time:  Wednesday November 26 2014 09:45:49 EDT Ventricular Rate:  80 PR Interval:  140 QRS Duration: 93 QT Interval:  385 QTC Calculation: 444 R Axis:   9 Text Interpretation:  Sinus rhythm No significant change since last  tracing Confirmed by Gwendolyn Grant  MD, Gentle Hoge ([id number]) on 11/26/2014 9:58:40 AM      MDM   Final diagnoses:  Non-intractable vomiting with nausea, vomiting of unspecified type  Weakness    89M here with vomiting for past few days. Type I diabetic, hasn't missed any insulin. Was taking amoxicillin for upcoming dental work. No recent illness otherwise. No abdominal pain. No diarrhea. Concern for possible DKA, will check labs, give fluids. Labs ok. Patient eating here, no further vomiting. Patient reported he didn't want to come from the shelter. Stable for discharge.    Elwin Mocha, MD 11/26/14 1620

## 2014-11-26 NOTE — ED Notes (Signed)
He states he has had few episodes of emesis and body aches since yesterday afternoon.  He tells [place] he is a type I diabetic.  He is alert and oriented x 4 with clear speech.

## 2014-11-26 NOTE — Discharge Instructions (Signed)
Nausea and Vomiting °Nausea is a sick feeling that often comes before throwing up (vomiting). Vomiting is a reflex where stomach contents come out of your mouth. Vomiting can cause severe loss of body fluids (dehydration). Children and elderly adults can become dehydrated quickly, especially if they also have diarrhea. Nausea and vomiting are symptoms of a condition or disease. It is important to find the cause of your symptoms. °CAUSES  °· Direct irritation of the stomach lining. This irritation can result from increased acid production (gastroesophageal reflux disease), infection, food poisoning, taking certain medicines (such as nonsteroidal anti-inflammatory drugs), alcohol use, or tobacco use. °· Signals from the brain. These signals could be caused by a headache, heat exposure, an inner ear disturbance, increased pressure in the brain from injury, infection, a tumor, or a concussion, pain, emotional stimulus, or metabolic problems. °· An obstruction in the gastrointestinal tract (bowel obstruction). °· Illnesses such as diabetes, hepatitis, gallbladder problems, appendicitis, kidney problems, cancer, sepsis, atypical symptoms of a heart attack, or eating disorders. °· Medical treatments such as chemotherapy and radiation. °· Receiving medicine that makes you sleep (general anesthetic) during surgery. °DIAGNOSIS °Your caregiver may ask for tests to be done if the problems do not improve after a few days. Tests may also be done if symptoms are severe or if the reason for the nausea and vomiting is not clear. Tests may include: °· Urine tests. °· Blood tests. °· Stool tests. °· Cultures (to look for evidence of infection). °· X-rays or other imaging studies. °Test results can help your caregiver make decisions about treatment or the need for additional tests. °TREATMENT °You need to stay well hydrated. Drink frequently but in small amounts. You may wish to drink water, sports drinks, clear broth, or eat frozen  ice pops or gelatin dessert to help stay hydrated. When you eat, eating slowly may help prevent nausea. There are also some antinausea medicines that may help prevent nausea. °HOME CARE INSTRUCTIONS  °· Take all medicine as directed by your caregiver. °· If you do not have an appetite, do not force yourself to eat. However, you must continue to drink fluids. °· If you have an appetite, eat a normal diet unless your caregiver tells you differently. °¨ Eat a variety of complex carbohydrates (rice, wheat, potatoes, bread), lean meats, yogurt, fruits, and vegetables. °¨ Avoid high-fat foods because they are more difficult to digest. °· Drink enough water and fluids to keep your urine clear or pale yellow. °· If you are dehydrated, ask your caregiver for specific rehydration instructions. Signs of dehydration may include: °¨ Severe thirst. °¨ Dry lips and mouth. °¨ Dizziness. °¨ Dark urine. °¨ Decreasing urine frequency and amount. °¨ Confusion. °¨ Rapid breathing or pulse. °SEEK IMMEDIATE MEDICAL CARE IF:  °· You have blood or brown flecks (like coffee grounds) in your vomit. °· You have black or bloody stools. °· You have a severe headache or stiff neck. °· You are confused. °· You have severe abdominal pain. °· You have chest pain or trouble breathing. °· You do not urinate at least once every 8 hours. °· You develop cold or clammy skin. °· You continue to vomit for longer than 24 to 48 hours. °· You have a fever. °MAKE SURE YOU:  °· Understand these instructions. °· Will watch your condition. °· Will get help right away if you are not doing well or get worse. °Document Released: 02/14/2005 Document Revised: 05/09/2011 Document Reviewed: 07/14/2010 °ExitCare® Patient Information ©2015 [employer]. This information is not intended   to replace advice given to you by your health care provider. Make sure you discuss any questions you have with your health care provider. ° ° °Emergency Department Resource Guide °1) Find a  Doctor and Pay Out of Pocket °Although you won't have to find out who is covered by your insurance plan, it is a good idea to ask around and get recommendations. You will then need to call the office and see if the doctor you have chosen will accept you as a new patient and what types of options they offer for patients who are self-pay. Some doctors offer discounts or will set up payment plans for their patients who do not have insurance, but you will need to ask so you aren't surprised when you get to your appointment. ° °2) Contact Your Local Health Department °Not all health departments have doctors that can see patients for sick visits, but many do, so it is worth a call to see if yours does. If you don't know where your local health department is, you can check in your phone book. The CDC also has a tool to help you locate your state's health department, and many state websites also have listings of all of their local health departments. ° °3) Find a Walk-in Clinic °If your illness is not likely to be very severe or complicated, you may want to try a walk in clinic. These are popping up all over the country in pharmacies, drugstores, and shopping centers. They're usually staffed by nurse practitioners or physician assistants that have been trained to treat common illnesses and complaints. They're usually fairly quick and inexpensive. However, if you have serious medical issues or chronic medical problems, these are probably not your best option. ° °No Primary Care Doctor: °- Call [facility] at  832-8000 - they can help you locate a primary care doctor that  accepts your insurance, provides certain services, etc. °- Physician Referral Service- 1-800-533-3463 ° °Chronic Pain Problems: °Organization         Address  Phone   Notes  °[facility]  (336) 297-2271 Patients need to be referred by their primary care doctor.  ° °Medication Assistance: °Organization         Address  Phone    Notes  °[place] [facility] [address] °[place], [place] [address] (336) 641-8030 --Must be a resident of [place] °-- Must have NO insurance coverage whatsoever (no Medicaid/ Medicare, etc.) °-- The pt. MUST have a primary care doctor that directs their care regularly and follows them in the community °  °[facility]  (866) 331-1348   °[employer]  (888) 892-1162   ° °Agencies that provide inexpensive medical care: °Organization         Address  Phone   Notes  °[facility]  (336) 832-8035   °[facility]    (336) 832-7272   °[facility] [address] °[place], [place] [address] (336) 832-4777   °[facility] [address], °[place] (336) 271-4999   °[facility]    (336) 373-0678   °[facility]    (336) 272-1050   °[facility] ° [address], [place] Phone:  (336) 832-4444, Fax:  (336) 832-4440 Hours of Operation:  9 am - 6 pm, M-F.  Also accepts Medicaid/Medicare and self-pay.  °[facility] ° [address], [place]   Phone: (336) 832-3150, Fax: (336) [phone]. Hours of Operation:  8:30 am - 5:30 pm, M-F.  Also accepts Medicaid and self-pay.  °[facility] [place] [address], [place] Phone: (336) 878-6027   °[facility] [address], [place], [place] (336)723-1848, Ext. 123 Mondays & Thursdays: 7-9 AM.  First 15 patients are seen on a first come, first serve basis. °  ° °Medicaid-accepting [place] Providers: ° °Organization         Address  Phone   Notes  °[facility] [address], [place] (336) 641-2100 Also accepts self-pay patients.  °[facility] [address], [place] ° (336) 856-9996   °[facility] [address], [place] (336) 288-8857   °[facility] [address] [place] [address], [place] (336) 299-7000   °Veita Bland [address], [place]  ° (336) 373-1557 Only accepts [place] Access Medicaid patients after they have their name applied to their card.  ° °Self-Pay (no insurance) in [place]: ° °Organization         Address  Phone   Notes  °Sickle Cell Patients, [facility] [address], [place] (336) 832-1970   °[facility] [address], [place] (336) 832-4400   °[facility] ° [address], [place] (336) 992-4800   °[facility]/Dr. Osei-Bonsu ° [address] [place] [address], [place] or [address], [place] (336) [phone] Phone number for both [place] and [place] locations is the same.  °[facility] [address], [place] (336) 299-0000   °[facility] [address] [place] [address], [place] or [address] (336) 852-7530 °(336) 878-2260   °[facility] [address], [place] (336) 350-1642, phone; (336) 294-5005, fax Sees patients 1st and 3rd Saturday of every month.  Must not qualify for public or private insurance (i.e. Medicaid, Medicare, [place] Health Choice, Veterans' Benefits) • Household income should be no more than 200% of the poverty level •The clinic cannot treat you if you are pregnant or think you are pregnant • Sexually transmitted diseases are not treated at the clinic.  ° ° °Dental Care: °Organization         Address  Phone  Notes  °[facility] [address],  (336) 641-6152 Accepts children up to age 21 who are enrolled in Medicaid or [place] Health Choice; pregnant women with a Medicaid card; and children who have applied for Medicaid or [place] Health Choice, but were declined, whose parents can pay a reduced fee at time of service.  °[facility] [place]  [address], [place] (336) 641-7733 Accepts children up to age 21 who are enrolled in Medicaid or [place] Health Choice; pregnant women with a Medicaid card; and children who have applied for Medicaid or [place] Health Choice, but were declined, whose parents can pay a reduced fee at time of service.  °[facility] ° [address],  (336) 641-4533 Patients are seen by appointment only. Walk-ins are not accepted. [facility] will see patients 18 years of age and older. °Monday - Tuesday (8am-5pm) °Most Wednesdays (8:30-5pm) °$30 per visit, cash only  °[facility] ° [address], [place] (336) 641-4533 Patients are seen by appointment only. Walk-ins are not accepted. [facility] will see patients 18 years of age and older. °One Wednesday Evening (Monthly: Volunteer Based).  $30 per visit, cash only  °[facility]  (919) 537-3737 for adults; Children under age 4, call Graduate Pediatric Dentistry at ([phone]) [phone]. Children aged 4-14, please call ([phone]) [phone] to request a pediatric application. ° Dental services are provided in all areas of dental care including fillings, crowns and bridges, complete and partial dentures, implants, gum treatment, root canals, and extractions. Preventive care is also provided. Treatment is provided to both adults and children. °Patients are selected via a lottery and there is often a waiting list. °  °[facility] [address], °[place] ° (336) 763-8833 [email or web address] °  °[facility] [address], [place], [place] (336)723-1848, Ext. 123 Second and Fourth Thursday of each month, opens at 6:30 AM; Clinic ends at 9 AM.  Patients are seen on a first-come first-served basis, and a limited number are seen during each clinic.  ° °[facility] ° [address], [place], [place] (336) 723-7904   Eligibility Requirements °You must have lived in  [place], [place], or [place] counties for at least the last three months. °  You cannot be eligible for state or federal sponsored healthcare insurance, including Veterans Administration, Medicaid, or Medicare. °  You generally cannot be eligible for healthcare insurance through your employer.  °  How to apply: °Eligibility screenings are held every Tuesday and Wednesday afternoon from 1:00 pm until 4:00 pm. You do not need an appointment for the interview!  °[facility] [address], [place], [place] 336-631-2330   °[facility]  336-342-8273   °[place] [facility]  336-703-3100   °[facility]  336-570-6415   ° °Behavioral Health Resources in the Community: °Intensive Outpatient Programs °Organization         Address  Phone  Notes  °[facility] [address], [place], [place] 336-878-6098   °[facility] [address], [place], [place] 336-832-9800   °[facility] [address], [place], [place] ° 336-882-2125   °[facility] [address],  °[place], [place] 1-800-853-5163 or 336-641-4981   °Substance Abuse Resources °Organization         Address  Phone  Notes  °[facility]  336-882-2125   °[facility]  336-784-9470   °[facility]  336-285-9073   °[facility]  336-845-3988   °Residential & Outpatient Substance Abuse Program  1-800-659-3381   °Psychological Services °Organization         Address  Phone  Notes  °[facility]  336- 832-9600   °[facility]  336- 378-7881   °[facility] [address], [place] 1-800-853-5163 or 336-641-4981   ° °Mobile Crisis Teams °Organization         Address  Phone  Notes  °[facility], Mobile Crisis Care Unit  1-877-626-1772   °Assertive °[facility] ° [address] [place], [place] 336-834-9664   °Sharon DeEsch [address] °[place] 336-554-5454   ° °Self-Help/Support Groups °Organization         Address  Phone             Notes  °[facility]  - variety of   support groups  336- 373-1402 Call for more information  °Narcotics Anonymous (NA), [facility] [address], °[place]  2 meetings at this location  ° °Residential Treatment Programs °Organization         Address  Phone  Notes  °[facility] [address],    °[place]  1-866-801-8205   °[facility] ° [address], [place], [place] 704-293-8524   °[facility] [address], [place] 336-845-3988 Admissions: 8am-3pm M-F  °[facility] [address]    °[place], [place] 336-841-1104   °[facility] [address], [place], [place] 336-379-7146   °[facility] [address] [place] [address]  °[place], [place] 336-285-9073   °[facility] Intensive Outpatient [address], [place], [place] 336-852-3033   °[facility] [address]  °[place], [place] 1-877-615-2722 or 336-784-9470   °[facility] [address] [place], [place] 336-227-7417 Accepts Medicaid  °[facility] [address]  °[place] 1-800-659-3381 Substance Abuse/Addiction Treatment  ° °[place] Behavioral Health Resources °Organization         Address  Phone  Notes  °[facility]  (888) 581-9988   °Julie Brannon, PhD [address] [place], [place]   (336) 349-5553 or (336) 951-0000   °[facility]   [address] °[place], [place] (336) 349-4454   °[facility] [address], [place], [place] (336) 342-8316 Insurance/Medicaid/sponsorship through [facility]  °[facility] [address]                                    [place], [place] (336) 342-8316 Therapy/tele-psych/case  °[facility] [address]  ° [place], [place] (336)  349-2233    °Dr. Arfeen  (336) 349-4544   °[facility] [place]  [employer] [place] [facility] 1) [address], [place] °2) [address], [place] °3)  [address], [place] (336) 349-3220 °(336) 342-7768 ° °(336) 342-8140   °[place] [facility] (336) 342-1394 or (336) 342-3537 (After Hours)    ° ° ° °

## 2014-11-26 NOTE — ED Notes (Signed)
Pt escorted to discharge window. Pt verbalized understanding discharge instructions. In no acute distress.  

## 2014-11-26 NOTE — ED Notes (Signed)
He is drowsy and in no distress.

## 2014-12-09 ENCOUNTER — Other Ambulatory Visit (INDEPENDENT_AMBULATORY_CARE_PROVIDER_SITE_OTHER): Payer: No Typology Code available for payment source

## 2014-12-09 DIAGNOSIS — B20 Human immunodeficiency virus [HIV] disease: Secondary | ICD-10-CM

## 2014-12-09 LAB — CBC WITH DIFFERENTIAL/PLATELET
BASOS ABS: 0 10*3/uL (ref 0.0–0.1)
Basophils Relative: 0 % (ref 0–1)
EOS ABS: 0.1 10*3/uL (ref 0.0–0.7)
EOS PCT: 1 % (ref 0–5)
HEMATOCRIT: 46 % (ref 39.0–52.0)
Hemoglobin: 15.8 g/dL (ref 13.0–17.0)
LYMPHS ABS: 2.7 10*3/uL (ref 0.7–4.0)
LYMPHS PCT: 26 % (ref 12–46)
MCH: 30 pg (ref 26.0–34.0)
MCHC: 34.3 g/dL (ref 30.0–36.0)
MCV: 87.5 fL (ref 78.0–100.0)
MONOS PCT: 7 % (ref 3–12)
MPV: 9.4 fL (ref 8.6–12.4)
Monocytes Absolute: 0.7 10*3/uL (ref 0.1–1.0)
NEUTROS PCT: 66 % (ref 43–77)
Neutro Abs: 6.9 10*3/uL (ref 1.7–7.7)
PLATELETS: 258 10*3/uL (ref 150–400)
RBC: 5.26 MIL/uL (ref 4.22–5.81)
RDW: 14.9 % (ref 11.5–15.5)
WBC: 10.5 10*3/uL (ref 4.0–10.5)

## 2014-12-09 LAB — COMPLETE METABOLIC PANEL WITH GFR
ALT: 16 U/L (ref 9–46)
AST: 20 U/L (ref 10–40)
Albumin: 4.1 g/dL (ref 3.6–5.1)
Alkaline Phosphatase: 53 U/L (ref 40–115)
BILIRUBIN TOTAL: 0.4 mg/dL (ref 0.2–1.2)
BUN: 17 mg/dL (ref 7–25)
CHLORIDE: 99 mmol/L (ref 98–110)
CO2: 26 mmol/L (ref 20–31)
CREATININE: 1.07 mg/dL (ref 0.60–1.35)
Calcium: 9.7 mg/dL (ref 8.6–10.3)
GFR, Est Non African American: >89 mL/min (ref >=60)
GLUCOSE: 51 mg/dL — AB (ref 65–99)
Potassium: 4.5 mmol/L (ref 3.5–5.3)
SODIUM: 135 mmol/L (ref 135–146)
TOTAL PROTEIN: 7.7 g/dL (ref 6.1–8.1)

## 2014-12-10 LAB — T-HELPER CELL (CD4) - (RCID CLINIC ONLY)
CD4 % Helper T Cell: 23 % — ABNORMAL LOW (ref 33–55)
CD4 T Cell Abs: 640 /uL (ref 400–2700)

## 2014-12-11 LAB — HIV-1 RNA QUANT-NO REFLEX-BLD

## 2015-01-01 ENCOUNTER — Ambulatory Visit: Payer: Self-pay | Admitting: Internal Medicine

## 2015-01-01 ENCOUNTER — Ambulatory Visit: Payer: No Typology Code available for payment source

## 2015-01-03 ENCOUNTER — Encounter (HOSPITAL_COMMUNITY): Payer: Self-pay | Admitting: Emergency Medicine

## 2015-01-03 ENCOUNTER — Emergency Department (HOSPITAL_COMMUNITY)
Admission: EM | Admit: 2015-01-03 | Discharge: 2015-01-03 | Disposition: A | Discharge status: Home / Self Care | Payer: No Typology Code available for payment source | Source: Home / Self Care

## 2015-01-03 DIAGNOSIS — E1065 Type 1 diabetes mellitus with hyperglycemia: Secondary | ICD-10-CM

## 2015-01-03 DIAGNOSIS — E108 Type 1 diabetes mellitus with unspecified complications: Secondary | ICD-10-CM

## 2015-01-03 DIAGNOSIS — B353 Tinea pedis: Secondary | ICD-10-CM

## 2015-01-03 DIAGNOSIS — IMO0002 Reserved for concepts with insufficient information to code with codable children: Secondary | ICD-10-CM

## 2015-01-03 MED ORDER — TERBINAFINE HCL 250 MG PO TABS: 250.0000 mg | ORAL_TABLET | Freq: Every day | ORAL | Status: DC

## 2015-01-03 MED ORDER — BUTENAFINE HCL 1 % EX CREA: 30.0000 g | TOPICAL_CREAM | CUTANEOUS | Status: AC

## 2015-01-03 NOTE — ED Notes (Signed)
[facility] with a complaint of numbness to the end two toes of his left foot. He stated that he has been checked for this prior and was told they did not know what was causing it. The patient stated that it is worse in the morning when he first stands up.

## 2015-01-03 NOTE — Discharge Instructions (Signed)
Athlete's Foot Athlete's foot (tinea pedis) is a fungal infection of the skin on the feet. It often occurs on the skin between the toes or underneath the toes. It can also occur on the soles of the feet. Athlete's foot is more likely to occur in hot, humid weather. Not washing your feet or changing your socks often enough can contribute to athlete's foot. The infection can spread from person to person (contagious). CAUSES Athlete's foot is caused by a fungus. This fungus thrives in warm, moist places. Most people get athlete's foot by sharing shower stalls, towels, and wet floors with an infected person. People with weakened immune systems, including those with diabetes, may be more likely to get athlete's foot. SYMPTOMS   Itchy areas between the toes or on the soles of the feet.  White, flaky, or scaly areas between the toes or on the soles of the feet.  Tiny, intensely itchy blisters between the toes or on the soles of the feet.  Tiny cuts on the skin. These cuts can develop a bacterial infection.  Thick or discolored toenails. DIAGNOSIS  Your caregiver can usually tell what the problem is by doing a physical exam. Your caregiver may also take a skin sample from the rash area. The skin sample may be examined under a microscope, or it may be tested to see if fungus will grow in the sample. A sample may also be taken from your toenail for testing. TREATMENT  Over-the-counter and prescription medicines can be used to kill the fungus. These medicines are available as powders or creams. Your caregiver can suggest medicines for you. Fungal infections respond slowly to treatment. You may need to continue using your medicine for several weeks. PREVENTION   Do not share towels.  Wear sandals in wet areas, such as shared locker rooms and shared showers.  Keep your feet dry. Wear shoes that allow air to circulate. Wear cotton or wool socks. HOME CARE INSTRUCTIONS   Take medicines as directed by  your caregiver. Do not use steroid creams on athlete's foot.  Keep your feet clean and cool. Wash your feet daily and dry them thoroughly, especially between your toes.  Change your socks every day. Wear cotton or wool socks. In hot climates, you may need to change your socks 2 to 3 times per day.  Wear sandals or canvas tennis shoes with good air circulation.  If you have blisters, soak your feet in Burow's solution or Epsom salts for 20 to 30 minutes, 2 times a day to dry out the blisters. Make sure you dry your feet thoroughly afterward. SEEK MEDICAL CARE IF:   You have a fever.  You have swelling, soreness, warmth, or redness in your foot.  You are not getting better after 7 days of treatment.  You are not completely cured after 30 days.  You have any problems caused by your medicines. MAKE SURE YOU:   Understand these instructions.  Will watch your condition.  Will get help right away if you are not doing well or get worse.   This information is not intended to replace advice given to you by your health care provider. Make sure you discuss any questions you have with your health care provider.      You have a severe case of athletes foot; worse because of your uncontrolled blood sugar. This is imperative for healthy feet. This infections can cause sores that will be extremely worrisome with Diabetes. Use the oral medication for 2 weeks  and the topicals as well. Keep feet clean and dry. Spray the shoes daily and alternate shoe wear.     Document Released: 02/12/2000 Document Revised: 05/09/2011 Document Reviewed: 08/18/2014 Elsevier Interactive Patient Education [employer]

## 2015-01-03 NOTE — ED Provider Notes (Signed)
CSN: [id number]     Arrival date & time 01/03/15  1307 History   None    Chief Complaint  Patient presents with  . Numbness   (Consider location/radiation/quality/duration/timing/severity/associated sxs/prior Treatment) HPI Comments:  Patient is a 29 yo male who carries a history of Type 1 DM, apparently not previously controlled per patient report. He presents with toe pain secondary to "areas of pain and coating" between his toes. He was seen for this a few weeks ago and given a cream, but the rash areas have worsened. They are tender to touch mainly on the left, but also starting on the right. No open wounds or drainage. No fevers.    The history is provided by the patient.    Past Medical History  Diagnosis Date  . Asthma     no prior hospitalizations, intubations  . Diabetes mellitus     Type I   History reviewed. No pertinent past surgical history. Family History  Problem Relation Age of Onset  . Thyroid disease Mother   . Diabetes Mother   . Breast cancer Maternal Grandmother   . Diabetes Maternal Grandmother    Social History  Substance Use Topics  . Smoking status: Current Every Day Smoker -- 0.25 packs/day  . Smokeless tobacco: Never Used  . Alcohol Use: 1.8 - 2.4 oz/week    0-1 Standard drinks or equivalent, 1 Cans of beer, 2 Shots of liquor per week    Review of Systems  All other systems reviewed and are negative.   Allergies  Shellfish allergy  Home Medications   Prior to Admission medications   Medication Sig Start Date End Date Taking? Authorizing Provider  albuterol (PROVENTIL HFA;VENTOLIN HFA) 108 (90 BASE) MCG/ACT inhaler Inhale 2 puffs into the lungs every 6 (six) hours as needed for wheezing. 11/12/14   Massie Maroon, FNP  Blood Glucose Monitoring Suppl (TRUE METRIX AIR GLUCOSE METER) DEVI 1 each by Does not apply route 4 (four) times daily -  with meals and at bedtime. 11/12/14   Massie Maroon, FNP  Butenafine HCl 1 % cream Apply 30 g  topically 1 day or 1 dose. 01/03/15 01/17/15  Riki Sheer, PA-C  elvitegravir-cobicistat-emtricitabine-tenofovir (GENVOYA) 150-150-200-10 MG TABS tablet Take 1 tablet by mouth daily. 11/11/14   Gardiner Barefoot, MD  glucose blood (TRUE METRIX BLOOD GLUCOSE TEST) test strip Use as instructed 11/12/14   Quentin Angst, MD  insulin aspart protamine- aspart (NOVOLOG MIX 70/30) (70-30) 100 UNIT/ML injection Injects 50 units in the morning and 35 units in the evening Patient taking differently: Inject 35-50 Units into the skin 2 (two) times daily. Injects 50 units in the morning and 35 units in the evening 11/12/14   Massie Maroon, FNP  Insulin Syringe-Needle U-100 (INSULIN SYRINGE 1CC/31GX5/16") 31G X 5/16" 1 ML MISC Use 3 times per day to inject insulin subcutaneous.  Dx code: 250.01 12/19/12   Linward Headland, MD  ondansetron (ZOFRAN ODT) 4 MG disintegrating tablet 1 tab sublingual q6h PRN vomiting 11/26/14   Elwin Mocha, MD  terbinafine (LAMISIL) 250 MG tablet Take 1 tablet (250 mg total) by mouth daily. 01/03/15   Riki Sheer, PA-C  TRUEPLUS LANCETS 28G MISC USE AS DIRECTED 11/12/14   Quentin Angst, MD   Meds Ordered and Administered this Visit  Medications - No data to display  BP 134/80 mmHg  Pulse 78  Temp(Src) 98.6 F (37 C) (Oral)  Resp 18  SpO2 100% No data  found.   Physical Exam  Constitutional: He is oriented to person, place, and time. He appears well-developed and well-nourished.  Pulmonary/Chest: Effort normal.  Neurological: He is alert and oriented to person, place, and time.  Skin: Skin is warm and dry.  Thick white plaques to left webs of 3rd-4th toes, moist and erythematous base. Early signs of this on the left. Both plantar surfaces with white dryness and plaques  Psychiatric: His behavior is normal.  Nursing note and vitals reviewed.   ED Course  Procedures (including critical care time)  Labs Review Labs Reviewed - No data to display  Imaging  Review No results found.   Visual Acuity Review  Right Eye Distance:   Left Eye Distance:   Bilateral Distance:    Right Eye Near:   Left Eye Near:    Bilateral Near:         MDM   1. Recurrent tinea pedis   2. Diabetes mellitus type 1, uncontrolled, with complications (HCC)    1. Moderate presentation in the setting of uncontrolled DM. Today will cover for recalcitrant tinea pedis with both oral and topical anti-fungals. He must also use Insulin Sliding scale as recommend by PCP and the importance of this is discussed today. He has no visible ulcerations. Suggest use of topical sprays to shoes and alternate shoe wear.  2. Close f/u with Endocrinologist is a must, he expresses understanding.     Riki SheerMichelle G Surah Pelley, PA-C 01/03/15 [phone]

## 2015-01-07 ENCOUNTER — Other Ambulatory Visit: Payer: Self-pay | Admitting: *Deleted

## 2015-01-07 DIAGNOSIS — B20 Human immunodeficiency virus [HIV] disease: Secondary | ICD-10-CM

## 2015-01-07 MED ORDER — ELVITEG-COBIC-EMTRICIT-TENOFAF 150-150-200-10 MG PO TABS: 1.0000 | ORAL_TABLET | Freq: Every day | ORAL | Status: DC

## 2015-01-07 NOTE — Progress Notes (Signed)
Notified [facility] via e-script. Andree CossHowell, Flay Ghosh M, RN

## 2015-01-14 ENCOUNTER — Ambulatory Visit: Payer: Self-pay

## 2015-01-14 DIAGNOSIS — F322 Major depressive disorder, single episode, severe without psychotic features: Secondary | ICD-10-CM

## 2015-01-14 DIAGNOSIS — F609 Personality disorder, unspecified: Secondary | ICD-10-CM
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
## 2015-02-02 ENCOUNTER — Ambulatory Visit: Payer: No Typology Code available for payment source

## 2015-02-07 ENCOUNTER — Telehealth: Payer: Self-pay | Admitting: Infectious Disease

## 2015-02-07 DIAGNOSIS — B20 Human immunodeficiency virus [HIV] disease: Secondary | ICD-10-CM

## 2015-02-07 MED ORDER — ELVITEG-COBIC-EMTRICIT-TENOFAF 150-150-200-10 MG PO TABS: 1.0000 | ORAL_TABLET | Freq: Every day | ORAL | Status: DC

## 2015-02-07 NOTE — Telephone Encounter (Signed)
Renewed Genvoya. His ADAp is active

## 2015-02-10 ENCOUNTER — Ambulatory Visit (INDEPENDENT_AMBULATORY_CARE_PROVIDER_SITE_OTHER): Payer: No Typology Code available for payment source | Admitting: Family Medicine

## 2015-02-10 ENCOUNTER — Encounter: Payer: Self-pay | Admitting: Family Medicine

## 2015-02-10 VITALS — BP 143/88 | HR 82 | Temp 98.8°F | Resp 16 | Ht 72.0 in | Wt 203.0 lb

## 2015-02-10 DIAGNOSIS — R519 Headache, unspecified: Secondary | ICD-10-CM

## 2015-02-10 DIAGNOSIS — R51 Headache: Secondary | ICD-10-CM

## 2015-02-10 DIAGNOSIS — E109 Type 1 diabetes mellitus without complications: Secondary | ICD-10-CM

## 2015-02-10 DIAGNOSIS — F17200 Nicotine dependence, unspecified, uncomplicated: Secondary | ICD-10-CM

## 2015-02-10 DIAGNOSIS — B20 Human immunodeficiency virus [HIV] disease: Secondary | ICD-10-CM

## 2015-02-10 LAB — COMPLETE METABOLIC PANEL WITH GFR
ALT: 16 U/L (ref 9–46)
AST: 14 U/L (ref 10–40)
Albumin: 4 g/dL (ref 3.6–5.1)
Alkaline Phosphatase: 43 U/L (ref 40–115)
BUN: 11 mg/dL (ref 7–25)
CHLORIDE: 101 mmol/L (ref 98–110)
CO2: 28 mmol/L (ref 20–31)
Calcium: 9.4 mg/dL (ref 8.6–10.3)
Creat: 1.04 mg/dL (ref 0.60–1.35)
Glucose, Bld: 52 mg/dL — ABNORMAL LOW (ref 65–99)
Potassium: 4 mmol/L (ref 3.5–5.3)
SODIUM: 136 mmol/L (ref 135–146)
Total Bilirubin: 0.6 mg/dL (ref 0.2–1.2)
Total Protein: 7.4 g/dL (ref 6.1–8.1)

## 2015-02-10 LAB — POCT URINALYSIS DIP (DEVICE)
BILIRUBIN URINE: NEGATIVE
Glucose, UA: NEGATIVE mg/dL
HGB URINE DIPSTICK: NEGATIVE
LEUKOCYTES UA: NEGATIVE
Nitrite: NEGATIVE
Protein, ur: 100 mg/dL — AB
SPECIFIC GRAVITY, URINE: 1.02 (ref 1.005–1.030)
Urobilinogen, UA: 0.2 mg/dL (ref 0.0–1.0)
pH: 7 (ref 5.0–8.0)

## 2015-02-10 LAB — GLUCOSE, CAPILLARY
Glucose-Capillary: 59 mg/dL — ABNORMAL LOW (ref 65–99)
Glucose-Capillary: 82 mg/dL (ref 65–99)

## 2015-02-10 MED ORDER — KETOROLAC TROMETHAMINE 60 MG/2ML IM SOLN
30.0000 mg | Freq: Once | INTRAMUSCULAR | Status: AC
  Administered 2015-02-10: 30 mg via INTRAMUSCULAR

## 2015-02-10 MED ORDER — ACETAMINOPHEN 500 MG PO TABS: 500.0000 mg | ORAL_TABLET | Freq: Four times a day (QID) | ORAL | Status: DC | PRN

## 2015-02-10 MED ORDER — INSULIN ASPART PROT & ASPART (70-30 MIX) 100 UNIT/ML ~~LOC~~ SUSP: SUBCUTANEOUS | Status: DC

## 2015-02-10 NOTE — Patient Instructions (Signed)
Basic Carbohydrate Counting for Diabetes Mellitus Carbohydrate counting is a method for keeping track of the amount of carbohydrates you eat. Eating carbohydrates naturally increases the level of sugar (glucose) in your blood, so it is important for you to know the amount that is okay for you to have in every meal. Carbohydrate counting helps keep the level of glucose in your blood within normal limits. The amount of carbohydrates allowed is different for every person. A dietitian can help you calculate the amount that is right for you. Once you know the amount of carbohydrates you can have, you can count the carbohydrates in the foods you want to eat. Carbohydrates are found in the following foods:  Grains, such as breads and cereals.  Dried beans and soy products.  Starchy vegetables, such as potatoes, peas, and corn.  Fruit and fruit juices.  Milk and yogurt.  Sweets and snack foods, such as cake, cookies, candy, chips, soft drinks, and fruit drinks. CARBOHYDRATE COUNTING There are two ways to count the carbohydrates in your food. You can use either of the methods or a combination of both. Reading the "Nutrition Facts" on Packaged Food The "Nutrition Facts" is an area that is included on the labels of almost all packaged food and beverages in the [place]. It includes the serving size of that food or beverage and information about the nutrients in each serving of the food, including the grams (g) of carbohydrate per serving.  Decide the number of servings of this food or beverage that you will be able to eat or drink. Multiply that number of servings by the number of grams of carbohydrate that is listed on the label for that serving. The total will be the amount of carbohydrates you will be having when you eat or drink this food or beverage. Learning Standard Serving Sizes of Food When you eat food that is not packaged or does not include "Nutrition Facts" on the label, you need to  measure the servings in order to count the amount of carbohydrates.A serving of most carbohydrate-rich foods contains about 15 g of carbohydrates. The following list includes serving sizes of carbohydrate-rich foods that provide 15 g ofcarbohydrate per serving:   1 slice of bread (1 oz) or 1 six-inch tortilla.    of a hamburger bun or English muffin.  4-6 crackers.   cup unsweetened dry cereal.    cup hot cereal.   cup rice or pasta.    cup mashed potatoes or  of a large baked potato.  1 cup fresh fruit or one small piece of fruit.    cup canned or frozen fruit or fruit juice.  1 cup milk.   cup plain fat-free yogurt or yogurt sweetened with artificial sweeteners.   cup cooked dried beans or starchy vegetable, such as peas, corn, or potatoes.  Decide the number of standard-size servings that you will eat. Multiply that number of servings by 15 (the grams of carbohydrates in that serving). For example, if you eat 2 cups of strawberries, you will have eaten 2 servings and 30 g of carbohydrates (2 servings x 15 g = 30 g). For foods such as soups and casseroles, in which more than one food is mixed in, you will need to count the carbohydrates in each food that is included. EXAMPLE OF CARBOHYDRATE COUNTING Sample Dinner  3 oz chicken breast.   cup of brown rice.   cup of corn.  1 cup milk.   1 cup strawberries with   sugar-free whipped topping.  Carbohydrate Calculation Step 1: Identify the foods that contain carbohydrates:   Rice.   Corn.   Milk.   Strawberries. Step 2:Calculate the number of servings eaten of each:   2 servings of rice.   1 serving of corn.   1 serving of milk.   1 serving of strawberries. Step 3: Multiply each of those number of servings by 15 g:   2 servings of rice x 15 g = 30 g.   1 serving of corn x 15 g = 15 g.   1 serving of milk x 15 g = 15 g.   1 serving of strawberries x 15 g = 15 g. Step 4: Add  together all of the amounts to find the total grams of carbohydrates eaten: 30 g + 15 g + 15 g + 15 g = 75 g.   This information is not intended to replace advice given to you by your health care provider. Make sure you discuss any questions you have with your health care provider.   Document Released: 02/14/2005 Document Revised: 03/07/2014 Document Reviewed: 01/11/2013 Elsevier Interactive Patient Education 2016 [employer] Blood Glucose Monitoring, Adult Monitoring your blood glucose (also know as blood sugar) helps you to manage your diabetes. It also helps you and your health care provider monitor your diabetes and determine how well your treatment plan is working. WHY SHOULD YOU MONITOR YOUR BLOOD GLUCOSE?  It can help you understand how food, exercise, and medicine affect your blood glucose.  It allows you to know what your blood glucose is at any given moment. You can quickly tell if you are having low blood glucose (hypoglycemia) or high blood glucose (hyperglycemia).  It can help you and your health care provider know how to adjust your medicines.  It can help you understand how to manage an illness or adjust medicine for exercise. WHEN SHOULD YOU TEST? Your health care provider will help you decide how often you should check your blood glucose. This may depend on the type of diabetes you have, your diabetes control, or the types of medicines you are taking. Be sure to write down all of your blood glucose readings so that this information can be reviewed with your health care provider. See below for examples of testing times that your health care provider may suggest. Type 1 Diabetes  Test at least 2 times per day if your diabetes is well controlled, if you are using an insulin pump, or if you perform multiple daily injections.  If your diabetes is not well controlled or if you are sick, you may need to test more often.  It is a good idea to also test:  Before every insulin  injection.  Before and after exercise.  Between meals and 2 hours after a meal.  Occasionally between 2:00 a.m. and 3:00 a.m. Type 2 Diabetes  If you are taking insulin, test at least 2 times per day. However, it is best to test before every insulin injection.  If you take medicines by mouth (orally), test 2 times a day.  If you are on a controlled diet, test once a day.  If your diabetes is not well controlled or if you are sick, you may need to monitor more often. HOW TO MONITOR YOUR BLOOD GLUCOSE Supplies Needed  Blood glucose meter.  Test strips for your meter. Each meter has its own strips. You must use the strips that go with your own meter.  A pricking  needle (lancet).  A device that holds the lancet (lancing device).  A journal or log book to write down your results. Procedure  Wash your hands with soap and water. Alcohol is not preferred.  Prick the side of your finger (not the tip) with the lancet.  Gently milk the finger until a small drop of blood appears.  Follow the instructions that come with your meter for inserting the test strip, applying blood to the strip, and using your blood glucose meter. Other Areas to Get Blood for Testing Some meters allow you to use other areas of your body (other than your finger) to test your blood. These areas are called alternative sites. The most common alternative sites are:  The forearm.  The thigh.  The back area of the lower leg.  The palm of the hand. The blood flow in these areas is slower. Therefore, the blood glucose values you get may be delayed, and the numbers are different from what you would get from your fingers. Do not use alternative sites if you think you are having hypoglycemia. Your reading will not be accurate. Always use a finger if you are having hypoglycemia. Also, if you cannot feel your lows (hypoglycemia unawareness), always use your fingers for your blood glucose checks. ADDITIONAL TIPS FOR  GLUCOSE MONITORING  Do not reuse lancets.  Always carry your supplies with you.  All blood glucose meters have a 24-hour "hotline" number to call if you have questions or need help.  Adjust (calibrate) your blood glucose meter with a control solution after finishing a few boxes of strips. BLOOD GLUCOSE RECORD KEEPING It is a good idea to keep a daily record or log of your blood glucose readings. Most glucose meters, if not all, keep your glucose records stored in the meter. Some meters come with the ability to download your records to your home computer. Keeping a record of your blood glucose readings is especially helpful if you are wanting to look for patterns. Make notes to go along with the blood glucose readings because you might forget what happened at that exact time. Keeping good records helps you and your health care provider to work together to achieve good diabetes management.    This information is not intended to replace advice given to you by your health care provider. Make sure you discuss any questions you have with your health care provider.   Document Released: 02/17/2003 Document Revised: 03/07/2014 Document Reviewed: 07/09/2012 Elsevier Interactive Patient Education 2016 Elsevier Inc. Type 1 Diabetes Mellitus, Adult Type 1 diabetes mellitus, often simply referred to as diabetes, is a long-term (chronic) disease. It occurs when the islet cells in the pancreas that make insulin (a hormone) are destroyed and can no longer make insulin. Insulin is needed to move sugars from food into the tissue cells. The tissue cells use the sugars for energy. In people with type 1 diabetes, the sugars build up in the blood instead of going into the tissue cells. As a result, high blood sugar (hyperglycemia) develops. Without insulin, the body breaks down fat cells for the needed energy. This breakdown of fat cells produces acid chemicals (ketones), which increases the acid levels in the body. The  effect of either high ketone or high sugar (glucose) levels can be life-threatening.  Type 1 diabetes was also previously called juvenile diabetes. It most often occurs before the age of 33, but it can occur at any age. RISK FACTORS A person is predisposed to developing type 1  diabetes if someone in his or her family has the disease and is exposed to certain additional environmental triggers.  SYMPTOMS  Symptoms of type 1 diabetes may develop gradually over days to weeks or suddenly. The symptoms occur due to hyperglycemia. The symptoms can include:   Increased thirst (polydipsia).  Increased urination (polyuria).  Increased urination during the night (nocturia).  Weight loss. This weight loss may be rapid.  Frequent, recurring infections.  Tiredness (fatigue).  Weakness.  Vision changes, such as blurred vision.  Fruity smell to your breath.  Abdominal pain.  Nausea or vomiting.  An open skin wound (ulcer). DIAGNOSIS  Type 1 diabetes is diagnosed when symptoms of diabetes are present and when blood glucose levels are increased. Your blood glucose level may be checked by one or more of the following blood tests:  A fasting blood glucose test. You will not be allowed to eat for at least 8 hours before a blood sample is taken.  A random blood glucose test. Your blood glucose is checked at any time of the day regardless of when you ate.  A hemoglobin A1c blood glucose test. A hemoglobin A1c test provides information about blood glucose control over the previous 3 months. TREATMENT  Although type 1 diabetes cannot be prevented, it can be managed with insulin, diet, and exercise.  You will need to take insulin daily to keep blood glucose in the desired range.  You will need to match insulin dosing with exercise and healthy food choices. Generally, the goal of treatment is to maintain a pre-meal (preprandial) blood glucose level of 80-130 mg/dL. HOME CARE INSTRUCTIONS   Have  your hemoglobin A1c level checked twice a year.  Perform daily blood glucose monitoring as directed by your health care provider.  Monitor urine ketones when you are ill and as directed by your health care provider.  Take your insulin as directed by your health care provider to maintain your blood glucose level in the desired range.  Never run out of insulin. It is needed every day.  Adjust insulin based on your intake of carbohydrates. Carbohydrates can raise blood glucose levels but need to be included in your diet. Carbohydrates provide vitamins, minerals, and fiber, which are an essential part of a healthy diet. Carbohydrates are found in fruits, vegetables, whole grains, dairy products, legumes, and foods containing added sugars.  Eat healthy foods. Alternate 3 meals with 3 snacks.  Maintain a healthy weight.  Carry a medical alert card or wear your medical alert jewelry.  Carry a 15-gram carbohydrate snack with you at all times to treat low blood glucose (hypoglycemia). Some examples of 15-gram carbohydrate snacks include:  Glucose tablets, 3 or 4.  Glucose gel, 15-gram tube.  Raisins, 2 tablespoons (24 grams).  Jelly beans, 6.  Animal crackers, 8.  Fruit juice, regular soda, or low-fat milk, 4 ounces (120 mL).  Gummy treats, 9.  Recognize hypoglycemia. Hypoglycemia occurs with blood glucose levels of 70 mg/dL and below. The risk for hypoglycemia increases when fasting or skipping meals, during or after intense exercise, and during sleep. Hypoglycemia symptoms can include:  Tremors or shakes.  Decreased ability to concentrate.  Sweating.  Increased heart rate.  Headache.  Dry mouth.  Hunger.  Irritability.  Anxiety.  Restless sleep.  Altered speech or coordination.  Confusion.  Treat hypoglycemia promptly. If you are alert and able to safely swallow, follow the 15:15 rule:  Take 15-20 grams of rapid-acting glucose or carbohydrate. Rapid-acting  options include glucose  gel, glucose tablets, or 4 ounces (120 mL) of fruit juice, regular soda, or low-fat milk.  Check your blood glucose level 15 minutes after taking the glucose.  Take 15-20 grams more of glucose if the repeat blood glucose level is still 70 mg/dL or below.  Eat a meal or snack within 1 hour once blood glucose levels return to normal.  Be alert to polyuria and polydipsia, which are early signs of hyperglycemia. An early awareness of hyperglycemia allows for prompt treatment. Treat hyperglycemia as directed by your health care provider.  Exercise regularly as directed by your health care provider. This includes:  Stretching and performing strength training exercises, such as yoga or weight lifting, at least 2 times per week.  Performing a total of at least 150 minutes of moderate-intensity exercise each week, such as brisk walking or water aerobics.  Exercising at least 3 days per week, making sure you allow no more than 2 consecutive days to pass without exercising.  Avoiding long periods of inactivity (90 minutes or more). When you have to spend an extended period of time sitting down, take frequent breaks to walk or stretch.  Adjust your insulin dosing and food intake as needed if you start a new exercise or sport.  Follow your sick-day plan at any time you are unable to eat or drink as usual.   Do not use any tobacco products including cigarettes, chewing tobacco, or electronic cigarettes. If you need help quitting, ask your health care provider.  Limit alcohol intake to no more than 1 drink per day for nonpregnant women and 2 drinks per day for men. You should drink alcohol only when you are also eating food. Talk with your health care provider about whether alcohol is safe for you. Tell your health care provider if you drink alcohol several times a week.  Keep all follow-up visits as directed by your health care provider.  Schedule an eye exam within 5 years  of diagnosis and then annually.  Perform daily skin and foot care. Examine your skin and feet daily for cuts, bruises, redness, nail problems, bleeding, blisters, or sores. A foot exam should be done by a health care provider 5 years after diagnosis, and then every year after the first exam.  Brush your teeth and gums at least twice a day and floss at least once a day. Follow up with your dentist regularly.  Share your diabetes management plan with your workplace or school.  Keep your immunizations up to date. It is recommended that you receive a flu (influenza) vaccine every year. It is also recommended that you receive a pneumonia (pneumococcal) vaccine. If you are 46 years of age or older and have never received a pneumonia vaccine, this vaccine may be given as a series of two separate shots. Ask your health care provider which additional vaccines may be recommended.  Learn to manage stress.  Obtain ongoing diabetes education and support as needed.  Participate in or seek rehabilitation as needed to maintain or improve independence and quality of life. Request a physical or occupational therapy referral if you are having foot or hand numbness, or difficulties with grooming, dressing, eating, or physical activity. SEEK MEDICAL CARE IF:   You are unable to eat food or drink fluids for more than 6 hours.  You have nausea and vomiting for more than 6 hours.  Your blood glucose level is over 240 mg/dL.  There is a change in mental status.  You develop an additional  serious illness.  You have diarrhea for more than 6 hours.  You have been sick or have had a fever for a couple of days and are not getting better.  You have pain during any physical activity. SEEK IMMEDIATE MEDICAL CARE IF:  You have difficulty breathing.  You have moderate to large ketone levels. MAKE SURE YOU:  Understand these instructions.  Will watch your condition.  Will get help right away if you are not  doing well or get worse.   This information is not intended to replace advice given to you by your health care provider. Make sure you discuss any questions you have with your health care provider.   Document Released: 02/12/2000 Document Revised: 11/05/2014 Document Reviewed: 09/13/2011 Elsevier Interactive Patient Education [employer]

## 2015-02-10 NOTE — Progress Notes (Signed)
Subjective:    Patient ID: [name], male    DOB: 07/23/1985, 29 y.o.   MRN: [id number]  Diabetes Associated symptoms include fatigue. Pertinent negatives for diabetes include no chest pain, no polydipsia, no polyphagia and no polyuria.    Mr. Juan BaumgartenRashaun Adams, a 29 year old male with a history of diabetes type 1, HIV, and asthma presents for a 1 month follow up of diabetes. Patient reports that he has been checking blood sugars consistently. CBGs have been ranging from 59-110 fasting. He states that he has not been eating very much over the past several weeks due to increased dental pain. Patient had 2 teeth extracted on 02/09/2015. He states that he has increased pain to right face that is interfering with eating. Current pain intensity is 10/10 to right face. Clinical course of diabetes has fluctuated. He states that he has not been following a regular diet or checking his blood sugars. He currently does not have a glucometer.  Associated symptoms of hyperglycemia or hypoglycemia have been negative. He is currently taking a Novalog 70/30 mix 50 units in the am and 35 units in the pm. Patient denies foot ulcerations, hyperglycemia, nausea, paresthesia of the feet, polyuria, visual disturbances and vomitting.   Mr [name] is followed by Dr. Staci Righterobert Com[facility]. He was previously diagnosed in July 2016. Patient has been consistently taking medications as prescribed. Patient denies weight changes, nausea, vomiting, diarrhea, or skin changes.  Past Medical History  Diagnosis Date  . Asthma     no prior hospitalizations, intubations  . Diabetes mellitus     Type I   Social History   Social History  . Marital Status: Single    Spouse Name: N/A  . Number of Children: N/A  . Years of Education: N/A   Occupational History  . Not on file.   Social History Main Topics  . Smoking status: Current Every Day Smoker -- 0.25 packs/day  . Smokeless tobacco: Never  Used  . Alcohol Use: 1.8 - 2.4 oz/week    0-1 Standard drinks or equivalent, 1 Cans of beer, 2 Shots of liquor per week  . Drug Use: 2.00 per week    Special: Marijuana     Comment: daily  . Sexual Activity:    Partners: Male    Birth Control/ Protection: Condom   Other Topics Concern  . Not on file   Social History Narrative   Allergies  Allergen Reactions  . Shellfish Allergy Anaphylaxis and Hives   Immunization History  Administered Date(s) Administered  . Influenza,inj,Quad PF,36+ Mos 12/02/2012, 11/11/2014  . PPD Test 10/21/2014  . Pneumococcal Polysaccharide-23 10/05/2010  . Tdap 06/30/2012   Review of Systems  Constitutional: Positive for fatigue. Negative for fever.  HENT: Positive for dental problem (right facial swelling due to teeth extraction on 12/12).   Eyes: Negative.   Respiratory: Negative.  Negative for apnea, cough, choking, chest tightness, shortness of breath and wheezing.   Cardiovascular: Negative.  Negative for chest pain, palpitations and leg swelling.  Gastrointestinal: Negative.  Negative for constipation and blood in stool.  Endocrine: Negative.  Negative for polydipsia, polyphagia and polyuria.  Genitourinary: Negative.   Musculoskeletal: Negative.  Negative for myalgias.  Skin: Negative.  Negative for rash and wound.  Allergic/Immunologic: Negative.   Neurological: Negative.  Negative for numbness.  Hematological: Negative.   Psychiatric/Behavioral: Negative.  Negative for suicidal ideas and sleep disturbance.       Objective:  Physical Exam  Constitutional: He is oriented to person, place, and time. Vital signs are normal. He appears well-developed and well-nourished.  HENT:  Head: Normocephalic and atraumatic.  Right Ear: Hearing, tympanic membrane and external ear normal.  Left Ear: Hearing, tympanic membrane, external ear and ear canal normal.  Nose: Nose normal.  Mouth/Throat: Uvula is midline and oropharynx is clear and moist.  Abnormal dentition.  Eyes: Conjunctivae, EOM and lids are normal. Pupils are equal, round, and reactive to light.  Neck: Trachea normal and normal range of motion. Neck supple.  Cardiovascular: Normal rate, regular rhythm, normal heart sounds, intact distal pulses and normal pulses.   Pulmonary/Chest: Effort normal and breath sounds normal.  Abdominal: Soft. Normal appearance and bowel sounds are normal.  Musculoskeletal: Normal range of motion.  Lymphadenopathy:       Head (right side): No submental adenopathy present.       Head (left side): No submental adenopathy present.  Neurological: He is alert and oriented to person, place, and time. He has normal reflexes. No cranial nerve deficit or sensory deficit.  Skin: Skin is warm, dry and intact.  Right facial swelling, tender to palpation  Psychiatric: He has a normal mood and affect. His speech is normal and behavior is normal. Judgment and thought content normal.      BP 143/88 mmHg  Pulse 82  Temp(Src) 98.8 F (37.1 C) (Oral)  Resp 16  Ht 6' (1.829 m)  Wt 203 lb (92.08 kg)  BMI 27.53 kg/m2 Assessment & Plan:  1. Type 1 diabetes mellitus without complication Checked urinalysis, no glucosuria or ketonuria CBG= 59. Patient has not eaten since this am. Was given a 15 g carbohydrate snack during office visit. Blood sugar increased to 82. Will reduce Novalog Mix 70/30 to 20 units in the morning and in the evening. Patient warrants a referral to endocrinology for further evaluation.  Reviewed previous labs, electrolytes within normal range.  Will check hemoglobin a1C Previous eye examination in June 2016 - insulin aspart protamine- aspart (NOVOLOG MIX 70/30) (70-30) 100 UNIT/ML injection; Injects 20 units in the morning and 20 units in the evening. If CBG < 100, refrain from taking medications.  Dispense: 30 mL; Refill: 5 - Hemoglobin A1c - COMPLETE METABOLIC PANEL WITH GFR - POCT urinalysis dipstick - Glucose (CBG)  2. Right facial  pain Recommend applying ice pack to right face for 20 minutes 4 times per day as needed.  - acetaminophen (TYLENOL) 500 MG tablet; Take 1 tablet (500 mg total) by mouth every 6 (six) hours as needed.  Dispense: 30 tablet; Refill: 0 - ketorolac (TORADOL) injection 30 mg; Inject 1 mL (30 mg total) into the muscle once.  3. Human immunodeficiency virus (HIV) disease (HCC) Patent taking medications consistently. Patient to follow up with Dr. Luciana Axe as scheduled.   4. Tobacco dependence Smoking cessation instruction/counseling given:  counseled patient on the dangers of tobacco use, advised patient to stop smoking, and reviewed strategies to maximize success     RTC: 1 month for diabetes type I  Massie Maroon, FNP

## 2015-02-11 ENCOUNTER — Ambulatory Visit: Payer: No Typology Code available for payment source | Admitting: Family Medicine

## 2015-02-11 LAB — HEMOGLOBIN A1C
HEMOGLOBIN A1C: 9.2 % — AB (ref <5.7)
MEAN PLASMA GLUCOSE: 217 mg/dL — AB (ref <117)

## 2015-02-12 ENCOUNTER — Telehealth: Payer: Self-pay

## 2015-02-12 NOTE — Telephone Encounter (Signed)
Patient aware of lab results and recommendations. 

## 2015-02-12 NOTE — Telephone Encounter (Signed)
-----   Message from Massie MaroonLachina M Hollis, [place] sent at 02/11/2015  4:11 PM EST ----- Please inform patient that hemoglobin A1C is 9.2%, will continue medications as discussed during appt on yesterday. Recommend a carbohydrate modified diet over 5-6 small meals per day.  Will follow up in office as scheduled.  ----- Message -----    From: [facility] In [facility] Interface    Sent: 02/10/2015   1:34 PM      To: Massie MaroonLachina M Hollis, FNP

## 2015-03-04 ENCOUNTER — Ambulatory Visit (INDEPENDENT_AMBULATORY_CARE_PROVIDER_SITE_OTHER): Payer: No Typology Code available for payment source | Admitting: Internal Medicine

## 2015-03-04 ENCOUNTER — Encounter: Payer: Self-pay | Admitting: Internal Medicine

## 2015-03-04 VITALS — BP 149/91 | HR 88 | Temp 98.8°F | Wt 199.0 lb

## 2015-03-04 DIAGNOSIS — B20 Human immunodeficiency virus [HIV] disease: Secondary | ICD-10-CM

## 2015-03-04 DIAGNOSIS — F329 Major depressive disorder, single episode, unspecified: Secondary | ICD-10-CM

## 2015-03-04 DIAGNOSIS — F32A Depression, unspecified: Secondary | ICD-10-CM | POA: Insufficient documentation

## 2015-03-04 DIAGNOSIS — R42 Dizziness and giddiness: Secondary | ICD-10-CM | POA: Insufficient documentation

## 2015-03-04 DIAGNOSIS — F17200 Nicotine dependence, unspecified, uncomplicated: Secondary | ICD-10-CM

## 2015-03-04 NOTE — Assessment & Plan Note (Signed)
I encouraged him to follow up with our counselor and he will call later if he decides to do it.

## 2015-03-04 NOTE — Assessment & Plan Note (Signed)
I suspect related to his blood sugars and asked that he try to check it more.

## 2015-03-04 NOTE — Assessment & Plan Note (Signed)
precontemplative. 

## 2015-03-04 NOTE — Progress Notes (Signed)
CC: Follow up for HIV  Interval history: Currently is asymptomatic and viral load suppressed in October.  Since last visit he has seen our counselor though does not feel anything can help his depression.  No suicidal ideation.  Has no associated diarrhea.  Some weight loss and tells me the only reason he is not losing more weight is because he knowns he has to eat with type 1 diabetes.   He endorses occasional missed doses but I suspect it is more than he is letting on about.   Does not want more counseling, no psychiatry, no other medication.  Having housing issues.    Prior to Admission medications   Medication Sig Start Date End Date Taking? Authorizing Provider  acetaminophen (TYLENOL) 500 MG tablet Take 1 tablet (500 mg total) by mouth every 6 (six) hours as needed. 02/10/15  Yes Massie MaroonLachina M Hollis, FNP  Blood Glucose Monitoring Suppl (TRUE METRIX AIR GLUCOSE METER) DEVI 1 each by Does not apply route 4 (four) times daily -  with meals and at bedtime. 11/12/14  Yes Massie MaroonLachina M Hollis, FNP  elvitegravir-cobicistat-emtricitabine-tenofovir (GENVOYA) 150-150-200-10 MG TABS tablet Take 1 tablet by mouth daily. 02/07/15  Yes Randall Hissornelius N Van Dam, MD  glucose blood (TRUE METRIX BLOOD GLUCOSE TEST) test strip Use as instructed 11/12/14  Yes Olugbemiga E Hyman HopesJegede, MD  insulin aspart protamine- aspart (NOVOLOG MIX 70/30) (70-30) 100 UNIT/ML injection Injects 20 units in the morning and 20 units in the evening. If CBG < 100, refrain from taking medications. 02/10/15  Yes Massie MaroonLachina M Hollis, FNP  Insulin Syringe-Needle U-100 (INSULIN SYRINGE 1CC/31GX5/16") 31G X 5/16" 1 ML MISC Use 3 times per day to inject insulin subcutaneous.  Dx code: 250.01 12/19/12  Yes Linward Headlandyan K Brown, MD  TRUEPLUS LANCETS 28G MISC USE AS DIRECTED 11/12/14  Yes Quentin Angstlugbemiga E Jegede, MD    Review of Systems Constitutional: negative except for dizziness Gastrointestinal: negative for diarrhea All other systems reviewed and are negative   Physical  Exam: CONSTITUTIONAL:in no apparent distress and alert  Filed Vitals:   03/04/15 1556  BP: 149/91  Pulse: 88  Temp: 98.8 F (37.1 C)   Eyes: anicteric HENT: no thrush, no cervical lymphadenopathy Respiratory: Normal respiratory effort; CTA B  Lab Results  Component Value Date   HIV1RNAQUANT <20 12/09/2014   HIV1RNAQUANT 8832* 10/21/2014   No components found for: HIV1GENOTYPRPLUS No components found for: THELPERCELL

## 2015-03-04 NOTE — Assessment & Plan Note (Signed)
I am concerned he will develop resistance with his compliance.  His depression is the issue and it is difficult since he is not willing to engage at all.  I emphasized better compliance. RTC 3 months.

## 2015-03-05 LAB — T-HELPER CELL (CD4) - (RCID CLINIC ONLY)
CD4 % Helper T Cell: 27 % — ABNORMAL LOW (ref 33–55)
CD4 T Cell Abs: 720 /uL (ref 400–2700)

## 2015-03-06 LAB — HIV-1 RNA QUANT-NO REFLEX-BLD

## 2015-03-13 ENCOUNTER — Ambulatory Visit: Payer: No Typology Code available for payment source | Admitting: Family Medicine

## 2015-03-17 MED FILL — !NOVOLOG MIX 70/30 VIAL: 70-30/ML | 25 days supply | Qty: 30 | Fill #2

## 2015-04-01 ENCOUNTER — Ambulatory Visit: Payer: No Typology Code available for payment source | Admitting: *Deleted

## 2015-04-01 ENCOUNTER — Other Ambulatory Visit: Payer: Self-pay | Admitting: Internal Medicine

## 2015-04-01 DIAGNOSIS — F609 Personality disorder, unspecified: Secondary | ICD-10-CM

## 2015-04-01 DIAGNOSIS — E109 Type 1 diabetes mellitus without complications: Secondary | ICD-10-CM

## 2015-04-01 NOTE — BH Specialist Note (Signed)
Counselor met with [name] per request from nursing staff as well as his bridge counselor as a result of his anger, lack of cooperativeness, and depression.  Patient was oriented times four with sad affect and dirty dress.  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 Patient was tearful throughout the appointment.  Patient was a bit belligerent at first but warmed up as the session continued.  Patient indicated that he just feels useless in life other than taking care of his family members and being there for them.  Counselor provided support and encouragement for patient.  Counselor recommended that patient meet with counselor on a regular basis to process all that is happening in his life right now and learn better ways of coping appropriately.  Rolena Infante, MA [facility]/ [facility]

## 2015-05-06 MED FILL — !NOVOLOG MIX 70/30 VIAL: 70-30/ML | 25 days supply | Qty: 30 | Fill #3

## 2015-05-27 ENCOUNTER — Other Ambulatory Visit (INDEPENDENT_AMBULATORY_CARE_PROVIDER_SITE_OTHER): Payer: Self-pay

## 2015-05-27 DIAGNOSIS — B20 Human immunodeficiency virus [HIV] disease: Secondary | ICD-10-CM

## 2015-05-28 LAB — HIV-1 RNA QUANT-NO REFLEX-BLD

## 2015-05-28 LAB — T-HELPER CELL (CD4) - (RCID CLINIC ONLY)
CD4 % Helper T Cell: 30 % — ABNORMAL LOW (ref 33–55)
CD4 T Cell Abs: 930 /uL (ref 400–2700)

## 2015-06-09 ENCOUNTER — Ambulatory Visit (INDEPENDENT_AMBULATORY_CARE_PROVIDER_SITE_OTHER): Payer: Self-pay | Admitting: Internal Medicine

## 2015-06-09 ENCOUNTER — Encounter: Payer: Self-pay | Admitting: Internal Medicine

## 2015-06-09 ENCOUNTER — Ambulatory Visit: Payer: No Typology Code available for payment source | Admitting: *Deleted

## 2015-06-09 VITALS — BP 137/83 | HR 89 | Temp 98.0°F | Ht 72.0 in | Wt 194.0 lb

## 2015-06-09 DIAGNOSIS — Z79899 Other long term (current) drug therapy: Secondary | ICD-10-CM

## 2015-06-09 DIAGNOSIS — F32A Depression, unspecified: Secondary | ICD-10-CM

## 2015-06-09 DIAGNOSIS — Z113 Encounter for screening for infections with a predominantly sexual mode of transmission: Secondary | ICD-10-CM

## 2015-06-09 DIAGNOSIS — B20 Human immunodeficiency virus [HIV] disease: Secondary | ICD-10-CM

## 2015-06-09 DIAGNOSIS — F609 Personality disorder, unspecified: Secondary | ICD-10-CM

## 2015-06-09 DIAGNOSIS — F329 Major depressive disorder, single episode, unspecified: Secondary | ICD-10-CM

## 2015-06-09 NOTE — Assessment & Plan Note (Signed)
Doing great and encouraged continued compliance.  RTC 3 months.

## 2015-06-09 NOTE — Assessment & Plan Note (Addendum)
Still depressed but not engaging at all.  Does not want help.  No SI, no HI.

## 2015-06-09 NOTE — Progress Notes (Signed)
CC: Follow up for HIV  Interval history: Currently is asymptomatic and continues to have a suppressed viral load.  Since last visit he has seen our counselor again but did not reschedule.  No suicidal ideation.  Has no associated diarrhea. He endorses no missed doses.   Does not want more counseling, no psychiatry, no other medication.  Wants to see the dentist.   Prior to Admission medications   Medication Sig Start Date End Date Taking? Authorizing Provider  acetaminophen (TYLENOL) 500 MG tablet Take 1 tablet (500 mg total) by mouth every 6 (six) hours as needed. 02/10/15  Yes Massie MaroonLachina M Hollis, FNP  Blood Glucose Monitoring Suppl (TRUE METRIX AIR GLUCOSE METER) DEVI 1 each by Does not apply route 4 (four) times daily -  with meals and at bedtime. 11/12/14  Yes Massie MaroonLachina M Hollis, FNP  elvitegravir-cobicistat-emtricitabine-tenofovir (GENVOYA) 150-150-200-10 MG TABS tablet Take 1 tablet by mouth daily. 02/07/15  Yes Randall Hissornelius N Van Dam, MD  glucose blood (TRUE METRIX BLOOD GLUCOSE TEST) test strip Use as instructed 11/12/14  Yes Olugbemiga E Hyman HopesJegede, MD  insulin aspart protamine- aspart (NOVOLOG MIX 70/30) (70-30) 100 UNIT/ML injection Injects 20 units in the morning and 20 units in the evening. If CBG < 100, refrain from taking medications. 02/10/15  Yes Massie MaroonLachina M Hollis, FNP  Insulin Syringe-Needle U-100 (INSULIN SYRINGE 1CC/31GX5/16") 31G X 5/16" 1 ML MISC Use 3 times per day to inject insulin subcutaneous.  Dx code: 250.01 12/19/12  Yes Linward Headlandyan K Brown, MD  TRUEPLUS LANCETS 28G MISC USE AS DIRECTED 11/12/14  Yes Quentin Angstlugbemiga E Jegede, MD    Review of Systems Constitutional: negative except for dizziness Gastrointestinal: negative for diarrhea All other systems reviewed and are negative   Physical Exam: CONSTITUTIONAL:in no apparent distress and alert  Filed Vitals:   06/09/15 1021  BP: 137/83  Pulse: 89  Temp: 98 F (36.7 C)   Eyes: anicteric HENT: no thrush, no cervical  lymphadenopathy Respiratory: Normal respiratory effort; CTA B  Lab Results  Component Value Date   HIV1RNAQUANT <20 05/27/2015   HIV1RNAQUANT <20 03/04/2015   HIV1RNAQUANT <20 12/09/2014   No components found for: HIV1GENOTYPRPLUS No components found for: THELPERCELL

## 2015-06-09 NOTE — BH Specialist Note (Signed)
Counselor met with [name] today in the exam room per Dr. Linus Salmons request as patient demonstrated that he was depressed.  Patient was oriented times four with flat affect.  Patient was very humorless and solemn during interaction.  Patient communicated that although he was happy to see counselor he really had nothing to share because counselor could not make his HIV "go away".  Counselor discussed with patient the benefits of meeting with someone to process the difficult times and work on long term sadness and anxiety.  Counselor indicated that the quality of life can become even worse without adequate support.  Patient stated that he appreciated the concern but just didn't want to engage with anyone right now.  Counselor said ok but strongly encouraged patient to call and make an appointment if and when he needed too.  Patient agreed he would.   Rolena Infante, MA [facility]

## 2015-08-17 ENCOUNTER — Telehealth: Payer: Self-pay | Admitting: *Deleted

## 2015-08-17 ENCOUNTER — Other Ambulatory Visit (INDEPENDENT_AMBULATORY_CARE_PROVIDER_SITE_OTHER): Payer: Self-pay

## 2015-08-17 DIAGNOSIS — B20 Human immunodeficiency virus [HIV] disease: Secondary | ICD-10-CM

## 2015-08-17 DIAGNOSIS — Z79899 Other long term (current) drug therapy: Secondary | ICD-10-CM

## 2015-08-17 DIAGNOSIS — Z113 Encounter for screening for infections with a predominantly sexual mode of transmission: Secondary | ICD-10-CM

## 2015-08-17 LAB — CBC WITH DIFFERENTIAL/PLATELET
BASOS ABS: 0 {cells}/uL (ref 0–200)
BASOS PCT: 0 %
EOS ABS: 0 {cells}/uL — AB (ref 15–500)
EOS PCT: 0 %
HCT: 47.6 % (ref 38.5–50.0)
Hemoglobin: 15.5 g/dL (ref 13.2–17.1)
LYMPHS PCT: 31 %
Lymphs Abs: 1550 cells/uL (ref 850–3900)
MCH: 31.4 pg (ref 27.0–33.0)
MCHC: 32.6 g/dL (ref 32.0–36.0)
MCV: 96.6 fL (ref 80.0–100.0)
MONOS PCT: 9 %
MPV: 9.9 fL (ref 7.5–12.5)
Monocytes Absolute: 450 cells/uL (ref 200–950)
NEUTROS ABS: 3000 {cells}/uL (ref 1500–7800)
Neutrophils Relative %: 60 %
PLATELETS: 347 10*3/uL (ref 140–400)
RBC: 4.93 MIL/uL (ref 4.20–5.80)
RDW: 13.6 % (ref 11.0–15.0)
WBC: 5 10*3/uL (ref 3.8–10.8)

## 2015-08-17 LAB — COMPLETE METABOLIC PANEL WITH GFR
ALBUMIN: 4.1 g/dL (ref 3.6–5.1)
ALK PHOS: 87 U/L (ref 40–115)
ALT: 20 U/L (ref 9–46)
AST: 11 U/L (ref 10–40)
BILIRUBIN TOTAL: 0.8 mg/dL (ref 0.2–1.2)
BUN: 20 mg/dL (ref 7–25)
CO2: 24 mmol/L (ref 20–31)
CREATININE: 1.11 mg/dL (ref 0.60–1.35)
Calcium: 9.8 mg/dL (ref 8.6–10.3)
Chloride: 90 mmol/L — ABNORMAL LOW (ref 98–110)
GFR, Est African American: >89 mL/min (ref >=60)
Glucose, Bld: 676 mg/dL (ref 65–99)
Potassium: 5.1 mmol/L (ref 3.5–5.3)
Sodium: 128 mmol/L — ABNORMAL LOW (ref 135–146)
TOTAL PROTEIN: 8.1 g/dL (ref 6.1–8.1)

## 2015-08-17 LAB — LIPID PANEL
CHOL/HDL RATIO: 1.7 ratio (ref <=5.0)
Cholesterol: 160 mg/dL (ref 125–200)
HDL: 92 mg/dL (ref >=40)
LDL Cholesterol: 50 mg/dL (ref <130)
Triglycerides: 91 mg/dL (ref <150)
VLDL: 18 mg/dL (ref <30)

## 2015-08-17 MED FILL — !NOVOLOG MIX 70/30 VIAL: 70-30/ML | 25 days supply | Qty: 30 | Fill #4

## 2015-08-17 NOTE — Telephone Encounter (Signed)
[facility] called with a critical high glucose for the patient of 676 from blood draw 08/17/15. Dr Luciana Axeomer notified

## 2015-08-18 LAB — RPR: RPR Ser Ql: REACTIVE — AB

## 2015-08-18 LAB — HIV-1 RNA QUANT-NO REFLEX-BLD: HIV 1 RNA Quant: <20 copies/mL (ref <20)

## 2015-08-18 LAB — RPR TITER

## 2015-08-18 LAB — T-HELPER CELL (CD4) - (RCID CLINIC ONLY)
CD4 T CELL HELPER: 28 % — AB (ref 33–55)
CD4 T Cell Abs: 410 /uL (ref 400–2700)

## 2015-08-18 NOTE — Telephone Encounter (Signed)
Thanks.  He should get to his pcp asap.

## 2015-08-18 NOTE — Telephone Encounter (Signed)
Called the patient about his elevated glucose and had to leave a message for him to call the office back. If he calls back will advised to see his PCP per Dr Luciana Axeomer.

## 2015-08-19 ENCOUNTER — Other Ambulatory Visit: Payer: Self-pay | Admitting: *Deleted

## 2015-08-19 DIAGNOSIS — E109 Type 1 diabetes mellitus without complications: Secondary | ICD-10-CM

## 2015-08-19 LAB — FLUORESCENT TREPONEMAL AB(FTA)-IGG-BLD: Fluorescent Treponemal ABS: REACTIVE — AB

## 2015-08-19 MED ORDER — INSULIN ASPART PROT & ASPART (70-30 MIX) 100 UNIT/ML ~~LOC~~ SUSP: SUBCUTANEOUS | Status: DC

## 2015-08-19 NOTE — Telephone Encounter (Signed)
PASS PROGRAM 

## 2015-08-21 ENCOUNTER — Other Ambulatory Visit: Payer: Self-pay | Admitting: Infectious Disease

## 2015-08-21 DIAGNOSIS — B20 Human immunodeficiency virus [HIV] disease: Secondary | ICD-10-CM

## 2015-08-25 ENCOUNTER — Ambulatory Visit: Payer: No Typology Code available for payment source | Admitting: Internal Medicine

## 2015-08-25 ENCOUNTER — Telehealth: Payer: Self-pay | Admitting: *Deleted

## 2015-08-25 NOTE — Telephone Encounter (Signed)
Attempted to contact patient with results, appointment.  Phone call was terminated by the answering party.  Will attempt again. Andree CossHowell, Michelle M, RN

## 2015-08-25 NOTE — Telephone Encounter (Signed)
-----   Message from Gardiner Barefootobert W Comer, MD sent at 08/24/2015  6:29 PM EDT ----- Positive for syphilis, needs one double shot. Thanks!

## 2015-09-24 ENCOUNTER — Ambulatory Visit (INDEPENDENT_AMBULATORY_CARE_PROVIDER_SITE_OTHER): Payer: Self-pay | Admitting: *Deleted

## 2015-09-24 ENCOUNTER — Ambulatory Visit: Payer: No Typology Code available for payment source | Admitting: *Deleted

## 2015-09-24 DIAGNOSIS — F609 Personality disorder, unspecified: Secondary | ICD-10-CM

## 2015-09-24 DIAGNOSIS — F329 Major depressive disorder, single episode, unspecified: Secondary | ICD-10-CM

## 2015-09-24 DIAGNOSIS — Z113 Encounter for screening for infections with a predominantly sexual mode of transmission: Secondary | ICD-10-CM

## 2015-09-24 DIAGNOSIS — A539 Syphilis, unspecified: Secondary | ICD-10-CM

## 2015-09-24 DIAGNOSIS — F32A Depression, unspecified: Secondary | ICD-10-CM

## 2015-09-24 MED ORDER — PENICILLIN G BENZATHINE 1200000 UNIT/2ML IM SUSP
1.2000 10*6.[IU] | Freq: Once | INTRAMUSCULAR | Status: AC
  Administered 2015-09-24: 1.2 10*6.[IU] via INTRAMUSCULAR

## 2015-09-24 NOTE — Progress Notes (Signed)
Pt walked into clinic, mistakenly thinking he had an appointment. This RN had attempted to contact patient regarding positive RPR and need for treatment per Dr. Comer. RN bought patient back for test results and treatment. Patient also expressed painful sores in his throat, tongue and inner lips.  RN obtained verbal order for oral, rectal swabs and urine cytology to test for gonorrhea and chlamydia per Dr. Van Dam.  Bicillin 2.4 million units IM one time only administered per verbal order from Dr. Comer; additional std testing without empiric treatment collected per Dr. Van Dam. Patient was tearful, expressed a desire to "let it all end."  RN offered patient counseling which he refused. RN spoke with patient at length where he revealed need for case management, housing assistance, food, medication assistance.  Patient met with [facility], received groceries from the pantry, will follow up with Amy on Monday for intake into case management. He will meet with  from Medication Assistance for the possibility of help with diabetic supplies and insulin (some programs identified by Betty in Pharmacy).  Patient still will not contract for safety with RN, so he met with Jodi Herring. ,  M, RN  

## 2015-09-24 NOTE — BH Specialist Note (Signed)
Counselor met with [place] as a warm hand off in an exam room due to communicating to nursing staff that he could care less if he lived or died.  Patient was oriented times four with flat affect.  Patient had been drinking and was slightly slurring his words, staggering a bit and not in good spirits.  Patient shared that he did not need any counseling or intervention.  Patient stated that he does not have a plan to hurt himself but would not mind if a friend or stranger could do it for him.  Counselor used reflective listening skills as patient continue to express his disinterest and complaints about staff interfering with his request of not talking about it.  Counselor explained to patient that when someone communicates such interest that there is a duty to warn accordingly thus the reason the counselor was involved. Patient was belligerent, non cooperative, and had a negative attitude. Patient clearly communicated that he wanted to leave and had no plan today to harm himself. Counselor encouraged patient to seek counseling and with someone in order to process what is going on in his life that has made him so unhappy.  Rolena Infante, MA, LPC [facility]

## 2015-09-25 ENCOUNTER — Telehealth: Payer: Self-pay

## 2015-09-25 NOTE — Telephone Encounter (Signed)
Britta Mccreedy with [facility] is calling to see if patient was treated for positive syphilis test performed in June, 2017.  They need to contact patient and the phone number they have as contact is not valid.  She was informed patient was here on yesterday and was given one dose of bicillin 2.55mil.  The phone number is the same as the one we have on file.  She was given the next scheduled appointment date for the patient and also informed the patient is aware [facility] is trying to contact him and has intentionally been avoiding them.   Laurell Josephs, RN

## 2015-09-28 LAB — CYTOLOGY, (ORAL, ANAL, URETHRAL) ANCILLARY ONLY
CHLAMYDIA, DNA PROBE: NEGATIVE
Chlamydia: NEGATIVE
NEISSERIA GONORRHEA: NEGATIVE
Neisseria Gonorrhea: NEGATIVE

## 2015-09-28 LAB — URINE CYTOLOGY ANCILLARY ONLY
Chlamydia: NEGATIVE
Neisseria Gonorrhea: POSITIVE — AB

## 2015-09-29 ENCOUNTER — Telehealth: Payer: Self-pay | Admitting: *Deleted

## 2015-09-29 NOTE — Telephone Encounter (Signed)
-----   Message from Randall Hiss, MD sent at 09/28/2015  6:35 PM EDT ----- Patient needs 250 mg IM and azithromycin 1 gram orally to treat his GC

## 2015-09-29 NOTE — Telephone Encounter (Signed)
Called patient to inform him of these results. No answer and no voice mail. Will try again

## 2015-09-30 NOTE — Telephone Encounter (Signed)
Unable to reach pt to schedule treatment.  Asked K. Harrellson, Artist, to notify Triage staff if patient comes to see her.

## 2015-10-08 ENCOUNTER — Ambulatory Visit: Payer: No Typology Code available for payment source

## 2015-10-22 ENCOUNTER — Encounter: Payer: Self-pay | Admitting: Internal Medicine

## 2015-10-26 ENCOUNTER — Ambulatory Visit (INDEPENDENT_AMBULATORY_CARE_PROVIDER_SITE_OTHER): Payer: No Typology Code available for payment source | Admitting: Internal Medicine

## 2015-10-26 ENCOUNTER — Encounter: Payer: Self-pay | Admitting: Internal Medicine

## 2015-10-26 ENCOUNTER — Ambulatory Visit: Payer: No Typology Code available for payment source | Admitting: *Deleted

## 2015-10-26 VITALS — BP 136/90 | HR 80 | Temp 98.3°F | Wt 184.0 lb

## 2015-10-26 DIAGNOSIS — F17200 Nicotine dependence, unspecified, uncomplicated: Secondary | ICD-10-CM

## 2015-10-26 DIAGNOSIS — A64 Unspecified sexually transmitted disease: Secondary | ICD-10-CM

## 2015-10-26 DIAGNOSIS — F32A Depression, unspecified: Secondary | ICD-10-CM

## 2015-10-26 DIAGNOSIS — F329 Major depressive disorder, single episode, unspecified: Secondary | ICD-10-CM

## 2015-10-26 DIAGNOSIS — B20 Human immunodeficiency virus [HIV] disease: Secondary | ICD-10-CM

## 2015-10-26 MED ORDER — CITALOPRAM HYDROBROMIDE 10 MG PO TABS: 10.0000 mg | ORAL_TABLET | Freq: Every day | ORAL | 1 refills | Status: DC

## 2015-10-26 MED ORDER — AZITHROMYCIN 250 MG PO TABS
1000.0000 mg | ORAL_TABLET | Freq: Once | ORAL | Status: AC
  Administered 2015-10-26: 1000 mg via ORAL

## 2015-10-26 MED ORDER — CEFTRIAXONE SODIUM 250 MG IJ SOLR
250.0000 mg | Freq: Once | INTRAMUSCULAR | Status: AC
  Administered 2015-10-26: 250 mg via INTRAMUSCULAR

## 2015-10-26 MED FILL — !NOVOLOG MIX 70/30 VIAL: 70-30/ML | 25 days supply | Qty: 30 | Fill #5

## 2015-10-27 NOTE — Progress Notes (Signed)
CC: Follow up for HIV  Interval history: Currently is asymptomatic and continues to have a suppressed viral load.  Has seen our counselor again but did not reschedule.  No suicidal ideation.  Has no associated diarrhea. He endorses no missed doses.   Continued depression and acting out.  Previously refused further counseling or medications for depression but now is interested. CD4 good at 410, viral load remains suppressed.    Prior to Admission medications   Medication Sig Start Date End Date Taking? Authorizing Provider  acetaminophen (TYLENOL) 500 MG tablet Take 1 tablet (500 mg total) by mouth every 6 (six) hours as needed. 02/10/15  Yes Massie MaroonLachina M Hollis, FNP  Blood Glucose Monitoring Suppl (TRUE METRIX AIR GLUCOSE METER) DEVI 1 each by Does not apply route 4 (four) times daily -  with meals and at bedtime. 11/12/14  Yes Massie MaroonLachina M Hollis, FNP  elvitegravir-cobicistat-emtricitabine-tenofovir (GENVOYA) 150-150-200-10 MG TABS tablet Take 1 tablet by mouth daily. 02/07/15  Yes Randall Hissornelius N Van Dam, MD  glucose blood (TRUE METRIX BLOOD GLUCOSE TEST) test strip Use as instructed 11/12/14  Yes Olugbemiga E Hyman HopesJegede, MD  insulin aspart protamine- aspart (NOVOLOG MIX 70/30) (70-30) 100 UNIT/ML injection Injects 20 units in the morning and 20 units in the evening. If CBG < 100, refrain from taking medications. 02/10/15  Yes Massie MaroonLachina M Hollis, FNP  Insulin Syringe-Needle U-100 (INSULIN SYRINGE 1CC/31GX5/16") 31G X 5/16" 1 ML MISC Use 3 times per day to inject insulin subcutaneous.  Dx code: 250.01 12/19/12  Yes Linward Headlandyan K Brown, MD  TRUEPLUS LANCETS 28G MISC USE AS DIRECTED 11/12/14  Yes Quentin Angstlugbemiga E Jegede, MD    Review of Systems Constitutional: negative except for dizziness Gastrointestinal: negative for diarrhea All other systems reviewed and are negative   Physical Exam: CONSTITUTIONAL:in no apparent distress and alert  Vitals:   10/26/15 1620  BP: 136/90  Pulse: 80  Temp: 98.3 F (36.8 C)   Eyes:  anicteric HENT: no thrush, no cervical lymphadenopathy Respiratory: Normal respiratory effort; CTA B Cardiovascular: RRR  Lab Results  Component Value Date   HIV1RNAQUANT <20 08/17/2015   HIV1RNAQUANT <20 05/27/2015   HIV1RNAQUANT <20 03/04/2015   No components found for: HIV1GENOTYPRPLUS No components found for: THELPERCELL  Social History   Social History  . Marital status: Single    Spouse name: N/A  . Number of children: N/A  . Years of education: N/A   Occupational History  . Not on file.   Social History Main Topics  . Smoking status: Current Every Day Smoker    Packs/day: 0.25    Types: Cigarettes    Start date: 09/29/2015  . Smokeless tobacco: Never Used  . Alcohol use 1.8 - 2.4 oz/week    1 Cans of beer, 2 Shots of liquor per week  . Drug use:     Frequency: 2.0 times per week    Types: Marijuana     Comment: daily  . Sexual activity: Yes    Partners: Male    Birth control/ protection: Condom     Comment: pt. declined condoms   Other Topics Concern  . Not on file   Social History Narrative  . No narrative on file

## 2015-10-27 NOTE — Assessment & Plan Note (Signed)
Continued suppressed virus.  Continue with same regimen.

## 2015-10-27 NOTE — Assessment & Plan Note (Signed)
Not interested in quitting at this time

## 2015-10-27 NOTE — BH Specialist Note (Signed)
Counselor met with Cage today as a warm hand off in the exam room.  Patient was oriented times four with good quiet affect but proper dress and appearance.  Patient was moving slow and speaking softly.  Patient said he wanted to apologize for his attitude lately with counselor and other staff members.  Patient stated that he has not been himself and knows that he has been taking his frustrations out on people who are just trying to help him.  Counselor reassured patient that he is still care about and forgiven.  Patient stated that he is doing ok but just feels down.  Patient said he is taking his medications accordingly.  This contradicts patient as he is having unprotected sex and developing STD's. Counselor provided support, educations and encouragement to patient.  Counselor recommended that patient meet with counselor to process and talk about his emotions and feelings.   Rolena Infante, MA, LPC [facility]

## 2015-10-27 NOTE — Assessment & Plan Note (Signed)
Now wants to go back to counseling.  Also interested in medication.  Will start celexa 10 mg and rtc 2 weeks.  I explained that it takes weeks to months to note improvement and dose may need to be adjusted each time.  He voiced his understanding.

## 2015-11-09 ENCOUNTER — Ambulatory Visit: Payer: No Typology Code available for payment source | Admitting: Internal Medicine

## 2016-01-05 ENCOUNTER — Other Ambulatory Visit: Payer: Self-pay | Admitting: Family Medicine

## 2016-01-05 DIAGNOSIS — E109 Type 1 diabetes mellitus without complications: Secondary | ICD-10-CM

## 2016-01-05 MED FILL — !NOVOLOG MIX 70/30 VIAL: 70-30/ML | 21 days supply | Qty: 20 | Fill #0

## 2016-01-07 ENCOUNTER — Other Ambulatory Visit (HOSPITAL_COMMUNITY)
Admission: RE | Admit: 2016-01-07 | Discharge: 2016-01-07 | Disposition: A | Discharge status: Home / Self Care | Payer: No Typology Code available for payment source | Source: Ambulatory Visit | Attending: Internal Medicine | Admitting: Internal Medicine

## 2016-01-07 ENCOUNTER — Other Ambulatory Visit (INDEPENDENT_AMBULATORY_CARE_PROVIDER_SITE_OTHER): Payer: Self-pay

## 2016-01-07 DIAGNOSIS — Z113 Encounter for screening for infections with a predominantly sexual mode of transmission: Secondary | ICD-10-CM

## 2016-01-07 DIAGNOSIS — B20 Human immunodeficiency virus [HIV] disease: Secondary | ICD-10-CM

## 2016-01-07 LAB — BASIC METABOLIC PANEL
BUN: 14 mg/dL (ref 7–25)
CALCIUM: 9.3 mg/dL (ref 8.6–10.3)
CO2: 27 mmol/L (ref 20–31)
Chloride: 100 mmol/L (ref 98–110)
Creat: 1.14 mg/dL (ref 0.60–1.35)
Glucose, Bld: 342 mg/dL — ABNORMAL HIGH (ref 65–99)
POTASSIUM: 4.5 mmol/L (ref 3.5–5.3)
SODIUM: 134 mmol/L — AB (ref 135–146)

## 2016-01-07 LAB — CBC
HCT: 45.8 % (ref 38.5–50.0)
HEMOGLOBIN: 15.1 g/dL (ref 13.2–17.1)
MCH: 30.9 pg (ref 27.0–33.0)
MCHC: 33 g/dL (ref 32.0–36.0)
MCV: 93.9 fL (ref 80.0–100.0)
MPV: 9.5 fL (ref 7.5–12.5)
PLATELETS: 287 10*3/uL (ref 140–400)
RBC: 4.88 MIL/uL (ref 4.20–5.80)
RDW: 14.4 % (ref 11.0–15.0)
WBC: 5.9 10*3/uL (ref 3.8–10.8)

## 2016-01-08 LAB — T-HELPER CELL (CD4) - (RCID CLINIC ONLY)
CD4 % Helper T Cell: 34 % (ref 33–55)
CD4 T Cell Abs: 530 /uL (ref 400–2700)

## 2016-01-08 LAB — URINE CYTOLOGY ANCILLARY ONLY
Chlamydia: NEGATIVE
Neisseria Gonorrhea: NEGATIVE

## 2016-01-09 LAB — HIV-1 RNA QUANT-NO REFLEX-BLD

## 2016-01-30 ENCOUNTER — Other Ambulatory Visit: Payer: Self-pay | Admitting: Internal Medicine

## 2016-01-30 DIAGNOSIS — F329 Major depressive disorder, single episode, unspecified: Secondary | ICD-10-CM

## 2016-01-30 DIAGNOSIS — F32A Depression, unspecified: Secondary | ICD-10-CM

## 2016-02-02 ENCOUNTER — Ambulatory Visit: Payer: No Typology Code available for payment source | Admitting: Internal Medicine

## 2016-03-01 MED FILL — !NOVOLOG MIX 70/30 VIAL: 70-30/ML | 14 days supply | Qty: 10 | Fill #0

## 2016-03-03 ENCOUNTER — Ambulatory Visit (INDEPENDENT_AMBULATORY_CARE_PROVIDER_SITE_OTHER): Payer: No Typology Code available for payment source | Admitting: Internal Medicine

## 2016-03-03 ENCOUNTER — Encounter: Payer: Self-pay | Admitting: Internal Medicine

## 2016-03-03 VITALS — BP 130/74 | HR 92 | Temp 97.9°F | Ht 72.0 in | Wt 188.0 lb

## 2016-03-03 DIAGNOSIS — F32A Depression, unspecified: Secondary | ICD-10-CM

## 2016-03-03 DIAGNOSIS — Z79899 Other long term (current) drug therapy: Secondary | ICD-10-CM

## 2016-03-03 DIAGNOSIS — Z113 Encounter for screening for infections with a predominantly sexual mode of transmission: Secondary | ICD-10-CM

## 2016-03-03 DIAGNOSIS — F329 Major depressive disorder, single episode, unspecified: Secondary | ICD-10-CM

## 2016-03-03 DIAGNOSIS — B20 Human immunodeficiency virus [HIV] disease: Secondary | ICD-10-CM

## 2016-03-03 MED ORDER — CITALOPRAM HYDROBROMIDE 20 MG PO TABS: 20.0000 mg | ORAL_TABLET | Freq: Every day | ORAL | 5 refills | Status: DC

## 2016-03-07 NOTE — Assessment & Plan Note (Signed)
[facility]

## 2016-03-07 NOTE — Assessment & Plan Note (Signed)
Continues to be undetectable.  No issues.  rtc 4-5 months.

## 2016-03-07 NOTE — Progress Notes (Signed)
CC: Follow up for HIV  Interval history: Currently is asymptomatic and well-controlled on Genvoya.  Since last visit denies any new issues.  He did see our counselor previously who has now left and does not want to see our other counselor.  Depression about the same.  No SI.  Has no associated n/d.  Denies any missed doses.       Prior to Admission medications   Medication Sig Start Date End Date Taking? Authorizing Provider  acetaminophen (TYLENOL) 500 MG tablet Take 1 tablet (500 mg total) by mouth every 6 (six) hours as needed. 02/10/15  Yes Massie MaroonLachina M Hollis, FNP  Blood Glucose Monitoring Suppl (TRUE METRIX AIR GLUCOSE METER) DEVI 1 each by Does not apply route 4 (four) times daily -  with meals and at bedtime. 11/12/14  Yes Massie MaroonLachina M Hollis, FNP  GENVOYA 150-150-200-10 MG TABS tablet TAKE 1 TABLET BY MOUTH DAILY 08/21/15  Yes Gardiner Barefootobert W Ambrielle Kington, MD  glucose blood (TRUE METRIX BLOOD GLUCOSE TEST) test strip Use as instructed 11/12/14  Yes Olugbemiga E Hyman HopesJegede, MD  insulin aspart protamine- aspart (NOVOLOG MIX 70/30) (70-30) 100 UNIT/ML injection Injects 20 units in the morning and 20 units in the evening. If CBG < 100, refrain from taking medications. 08/19/15  Yes Quentin Angstlugbemiga E Jegede, MD  insulin aspart protamine- aspart (NOVOLOG MIX 70/30) (70-30) 100 UNIT/ML injection Injects 50 units in the morning and 35 units in the evening 01/05/16  Yes Henrietta HooverLinda C Bernhardt, NP  Insulin Syringe-Needle U-100 (INSULIN SYRINGE 1CC/31GX5/16") 31G X 5/16" 1 ML MISC Use 3 times per day to inject insulin subcutaneous.  Dx code: 250.01 12/19/12  Yes Linward Headlandyan K Brown, MD  TRUEPLUS LANCETS 28G MISC USE AS DIRECTED 11/12/14  Yes Quentin Angstlugbemiga E Jegede, MD  citalopram (CELEXA) 20 MG tablet Take 1 tablet (20 mg total) by mouth daily. 03/03/16   Gardiner Barefootobert W Gor Vestal, MD    Review of Systems Constitutional: negative for fatigue and malaise Gastrointestinal: negative for diarrhea Integument/breast: negative for rash All other systems reviewed  and are negative    Physical Exam: CONSTITUTIONAL:in no apparent distress and oriented times 3  Vitals:   03/03/16 1133  BP: 130/74  Pulse: 92  Temp: 97.9 F (36.6 C)   Eyes: anicteric HENT: no thrush, no cervical lymphadenopathy Respiratory: Normal respiratory effort; CTA B Cardiovascular: RRR  Lab Results  Component Value Date   HIV1RNAQUANT <20 01/07/2016   HIV1RNAQUANT <20 08/17/2015   HIV1RNAQUANT <20 05/27/2015   No components found for: HIV1GENOTYPRPLUS No components found for: THELPERCELL

## 2016-04-02 ENCOUNTER — Other Ambulatory Visit: Payer: Self-pay | Admitting: Internal Medicine

## 2016-04-02 DIAGNOSIS — B20 Human immunodeficiency virus [HIV] disease: Secondary | ICD-10-CM

## 2016-04-06 MED FILL — !NOVOLOG MIX 70/30 VIAL: 70-30/ML | 14 days supply | Qty: 10 | Fill #1

## 2016-04-21 ENCOUNTER — Other Ambulatory Visit: Payer: Self-pay | Admitting: *Deleted

## 2016-04-21 MED ORDER — INSULIN ASPART PROT & ASPART (70-30 MIX) 100 UNIT/ML PEN: PEN_INJECTOR | SUBCUTANEOUS | 5 refills | Status: DC

## 2016-04-21 MED ORDER — INSULIN ASPART 100 UNIT/ML FLEXPEN: PEN_INJECTOR | SUBCUTANEOUS | 5 refills | Status: DC

## 2016-04-21 MED FILL — !NOVOLOG 70/30 FLEXPEN: 70-30 | 14 days supply | Qty: 12 | Fill #0

## 2016-04-21 NOTE — Telephone Encounter (Signed)
Patients Ins[facility].

## 2016-04-25 ENCOUNTER — Other Ambulatory Visit: Payer: Self-pay | Admitting: *Deleted

## 2016-04-25 MED ORDER — INSULIN ASPART PROT & ASPART (70-30 MIX) 100 UNIT/ML PEN: PEN_INJECTOR | SUBCUTANEOUS | 3 refills | Status: DC

## 2016-04-25 NOTE — Telephone Encounter (Signed)
PRINTED FOR PASS PROGRAM 

## 2016-05-02 ENCOUNTER — Encounter: Payer: Self-pay | Admitting: Internal Medicine

## 2016-05-20 ENCOUNTER — Inpatient Hospital Stay (HOSPITAL_COMMUNITY)
Admission: EM | Admit: 2016-05-20 | Discharge: 2016-05-24 | DRG: 372 | Disposition: A | Discharge status: Home / Self Care | Payer: No Typology Code available for payment source | Attending: Internal Medicine | Admitting: Internal Medicine

## 2016-05-20 ENCOUNTER — Inpatient Hospital Stay (HOSPITAL_COMMUNITY): Payer: Self-pay

## 2016-05-20 ENCOUNTER — Encounter (HOSPITAL_COMMUNITY): Payer: Self-pay | Admitting: Nurse Practitioner

## 2016-05-20 DIAGNOSIS — A09 Infectious gastroenteritis and colitis, unspecified: Secondary | ICD-10-CM

## 2016-05-20 DIAGNOSIS — B20 Human immunodeficiency virus [HIV] disease: Secondary | ICD-10-CM | POA: Diagnosis present

## 2016-05-20 DIAGNOSIS — F1290 Cannabis use, unspecified, uncomplicated: Secondary | ICD-10-CM | POA: Diagnosis present

## 2016-05-20 DIAGNOSIS — J45909 Unspecified asthma, uncomplicated: Secondary | ICD-10-CM | POA: Diagnosis present

## 2016-05-20 DIAGNOSIS — F17210 Nicotine dependence, cigarettes, uncomplicated: Secondary | ICD-10-CM | POA: Diagnosis present

## 2016-05-20 DIAGNOSIS — Z7289 Other problems related to lifestyle: Secondary | ICD-10-CM

## 2016-05-20 DIAGNOSIS — F17200 Nicotine dependence, unspecified, uncomplicated: Secondary | ICD-10-CM | POA: Diagnosis present

## 2016-05-20 DIAGNOSIS — N179 Acute kidney failure, unspecified: Secondary | ICD-10-CM

## 2016-05-20 DIAGNOSIS — R739 Hyperglycemia, unspecified: Secondary | ICD-10-CM

## 2016-05-20 DIAGNOSIS — Z79899 Other long term (current) drug therapy: Secondary | ICD-10-CM

## 2016-05-20 DIAGNOSIS — Z794 Long term (current) use of insulin: Secondary | ICD-10-CM

## 2016-05-20 DIAGNOSIS — E872 Acidosis, unspecified: Secondary | ICD-10-CM

## 2016-05-20 DIAGNOSIS — Z91013 Allergy to seafood: Secondary | ICD-10-CM

## 2016-05-20 DIAGNOSIS — E1065 Type 1 diabetes mellitus with hyperglycemia: Secondary | ICD-10-CM | POA: Diagnosis present

## 2016-05-20 DIAGNOSIS — E875 Hyperkalemia: Secondary | ICD-10-CM | POA: Diagnosis present

## 2016-05-20 DIAGNOSIS — E871 Hypo-osmolality and hyponatremia: Secondary | ICD-10-CM | POA: Diagnosis present

## 2016-05-20 DIAGNOSIS — Z21 Asymptomatic human immunodeficiency virus [HIV] infection status: Secondary | ICD-10-CM | POA: Diagnosis present

## 2016-05-20 DIAGNOSIS — A039 Shigellosis, unspecified: Secondary | ICD-10-CM | POA: Diagnosis present

## 2016-05-20 DIAGNOSIS — E860 Dehydration: Secondary | ICD-10-CM | POA: Diagnosis present

## 2016-05-20 DIAGNOSIS — R945 Abnormal results of liver function studies: Secondary | ICD-10-CM

## 2016-05-20 DIAGNOSIS — R7989 Other specified abnormal findings of blood chemistry: Secondary | ICD-10-CM

## 2016-05-20 DIAGNOSIS — E109 Type 1 diabetes mellitus without complications: Secondary | ICD-10-CM

## 2016-05-20 DIAGNOSIS — A419 Sepsis, unspecified organism: Secondary | ICD-10-CM

## 2016-05-20 DIAGNOSIS — R197 Diarrhea, unspecified: Secondary | ICD-10-CM | POA: Diagnosis present

## 2016-05-20 HISTORY — DX: Headache, unspecified: R51.9

## 2016-05-20 HISTORY — DX: Major depressive disorder, single episode, unspecified: F32.9

## 2016-05-20 HISTORY — DX: Anxiety disorder, unspecified: F41.9

## 2016-05-20 HISTORY — DX: Human immunodeficiency virus (HIV) disease: B20

## 2016-05-20 HISTORY — DX: Headache: R51

## 2016-05-20 HISTORY — DX: Reserved for inherently not codable concepts without codable children: IMO0001

## 2016-05-20 HISTORY — DX: Depression, unspecified: F32.A

## 2016-05-20 HISTORY — DX: Procedure and treatment not carried out because of patient's decision for reasons of belief and group pressure: Z53.1

## 2016-05-20 HISTORY — DX: Type 1 diabetes mellitus without complications: E10.9

## 2016-05-20 HISTORY — DX: Migraine, unspecified, not intractable, without status migrainosus: G43.909

## 2016-05-20 HISTORY — DX: Asymptomatic human immunodeficiency virus (hiv) infection status: Z21

## 2016-05-20 LAB — I-STAT CG4 LACTIC ACID, ED
LACTIC ACID, VENOUS: 4.39 mmol/L — AB (ref 0.5–1.9)
Lactic Acid, Venous: 4.09 mmol/L (ref 0.5–1.9)

## 2016-05-20 LAB — CBC WITH DIFFERENTIAL/PLATELET
BASOS ABS: 0 10*3/uL (ref 0.0–0.1)
BLASTS: 0 %
Band Neutrophils: 0 %
Basophils Relative: 0 %
EOS PCT: 0 %
Eosinophils Absolute: 0 10*3/uL (ref 0.0–0.7)
HEMATOCRIT: 50.6 % (ref 39.0–52.0)
Hemoglobin: 18.2 g/dL — ABNORMAL HIGH (ref 13.0–17.0)
LYMPHS ABS: 0.7 10*3/uL (ref 0.7–4.0)
LYMPHS PCT: 30 %
MCH: 31.9 pg (ref 26.0–34.0)
MCHC: 36 g/dL (ref 30.0–36.0)
MCV: 88.8 fL (ref 78.0–100.0)
Metamyelocytes Relative: 0 %
Monocytes Absolute: 0.2 10*3/uL (ref 0.1–1.0)
Monocytes Relative: 9 %
Myelocytes: 0 %
NEUTROS ABS: 1.5 10*3/uL — AB (ref 1.7–7.7)
NEUTROS PCT: 61 %
NRBC: 0 /100{WBCs}
Other: 0 %
PLATELETS: 176 10*3/uL (ref 150–400)
Promyelocytes Absolute: 0 %
RBC: 5.7 MIL/uL (ref 4.22–5.81)
RDW: 12.8 % (ref 11.5–15.5)
WBC Morphology: INCREASED
WBC: 2.4 10*3/uL — AB (ref 4.0–10.5)

## 2016-05-20 LAB — I-STAT VENOUS BLOOD GAS, ED
Acid-base deficit: 5 mmol/L — ABNORMAL HIGH (ref 0.0–2.0)
Bicarbonate: 14.7 mmol/L — ABNORMAL LOW (ref 20.0–28.0)
O2 Saturation: 99 %
PCO2 VEN: 19.8 mmHg — AB (ref 44.0–60.0)
PH VEN: 7.478 — AB (ref 7.250–7.430)
TCO2: 15 mmol/L (ref 0–100)
pO2, Ven: 123 mmHg — ABNORMAL HIGH (ref 32.0–45.0)

## 2016-05-20 LAB — APTT: APTT: 37 s — AB (ref 24–36)

## 2016-05-20 LAB — COMPREHENSIVE METABOLIC PANEL
ALBUMIN: 3.4 g/dL — AB (ref 3.5–5.0)
ALT: 223 U/L — ABNORMAL HIGH (ref 17–63)
ANION GAP: 16 — AB (ref 5–15)
AST: 162 U/L — ABNORMAL HIGH (ref 15–41)
Alkaline Phosphatase: 111 U/L (ref 38–126)
BUN: 15 mg/dL (ref 6–20)
CALCIUM: 9.2 mg/dL (ref 8.9–10.3)
CO2: 20 mmol/L — ABNORMAL LOW (ref 22–32)
Chloride: 92 mmol/L — ABNORMAL LOW (ref 101–111)
Creatinine, Ser: 1.49 mg/dL — ABNORMAL HIGH (ref 0.61–1.24)
GFR calc Af Amer: >60 mL/min (ref >60)
GLUCOSE: 416 mg/dL — AB (ref 65–99)
POTASSIUM: 5.2 mmol/L — AB (ref 3.5–5.1)
Sodium: 128 mmol/L — ABNORMAL LOW (ref 135–145)
TOTAL PROTEIN: 8 g/dL (ref 6.5–8.1)
Total Bilirubin: 1.9 mg/dL — ABNORMAL HIGH (ref 0.3–1.2)

## 2016-05-20 LAB — LIPASE, BLOOD

## 2016-05-20 LAB — LACTIC ACID, PLASMA
LACTIC ACID, VENOUS: 2.3 mmol/L — AB (ref 0.5–1.9)
Lactic Acid, Venous: 0.3 mmol/L — ABNORMAL LOW (ref 0.5–1.9)

## 2016-05-20 LAB — URINALYSIS, ROUTINE W REFLEX MICROSCOPIC
BACTERIA UA: NONE SEEN
Bilirubin Urine: NEGATIVE
Ketones, ur: NEGATIVE mg/dL
LEUKOCYTES UA: NEGATIVE
Nitrite: NEGATIVE
PROTEIN: 100 mg/dL — AB
SQUAMOUS EPITHELIAL / LPF: NONE SEEN
Specific Gravity, Urine: 1.032 — ABNORMAL HIGH (ref 1.005–1.030)
pH: 5 (ref 5.0–8.0)

## 2016-05-20 LAB — PROTIME-INR
INR: 1.5
Prothrombin Time: 18.2 seconds — ABNORMAL HIGH (ref 11.4–15.2)

## 2016-05-20 LAB — INFLUENZA PANEL BY PCR (TYPE A & B)
Influenza A By PCR: NEGATIVE
Influenza B By PCR: NEGATIVE

## 2016-05-20 LAB — CBG MONITORING, ED
Glucose-Capillary: 461 mg/dL — ABNORMAL HIGH (ref 65–99)
Glucose-Capillary: 172 mg/dL — ABNORMAL HIGH (ref 65–99)

## 2016-05-20 LAB — C DIFFICILE QUICK SCREEN W PCR REFLEX
C DIFFICLE (CDIFF) ANTIGEN: NEGATIVE
C Diff interpretation: NOT DETECTED
C Diff toxin: NEGATIVE

## 2016-05-20 MED ORDER — SODIUM CHLORIDE 0.9 % IV BOLUS (SEPSIS)
500.0000 mL | Freq: Once | INTRAVENOUS | Status: AC
  Administered 2016-05-20: 500 mL via INTRAVENOUS

## 2016-05-20 MED ORDER — ELVITEG-COBIC-EMTRICIT-TENOFAF 150-150-200-10 MG PO TABS
1.0000 | ORAL_TABLET | Freq: Every day | ORAL | Status: DC
Start: 2016-05-20 — End: 2016-05-24
  Administered 2016-05-20 – 2016-05-24 (×5): 1 via ORAL
  Filled 2016-05-20 (×5): qty 1

## 2016-05-20 MED ORDER — SODIUM CHLORIDE 0.9 % IV BOLUS (SEPSIS)
1000.0000 mL | Freq: Once | INTRAVENOUS | Status: AC
  Administered 2016-05-20: 1000 mL via INTRAVENOUS

## 2016-05-20 MED ORDER — SODIUM CHLORIDE 0.9 % IV BOLUS (SEPSIS): 500.0000 mL | Freq: Once | INTRAVENOUS | Status: DC

## 2016-05-20 MED ORDER — INSULIN ASPART PROT & ASPART (70-30 MIX) 100 UNIT/ML ~~LOC~~ SUSP
30.0000 [IU] | Freq: Two times a day (BID) | SUBCUTANEOUS | Status: DC
  Administered 2016-05-21 – 2016-05-22 (×3): 30 [IU] via SUBCUTANEOUS
  Filled 2016-05-20: qty 10

## 2016-05-20 MED ORDER — METRONIDAZOLE IN NACL 5-0.79 MG/ML-% IV SOLN
500.0000 mg | Freq: Once | INTRAVENOUS | Status: AC
  Administered 2016-05-20: 500 mg via INTRAVENOUS
  Filled 2016-05-20: qty 100

## 2016-05-20 MED ORDER — FAMOTIDINE IN NACL 20-0.9 MG/50ML-% IV SOLN
20.0000 mg | Freq: Two times a day (BID) | INTRAVENOUS | Status: DC
  Administered 2016-05-20 – 2016-05-21 (×3): 20 mg via INTRAVENOUS
  Filled 2016-05-20 (×4): qty 50

## 2016-05-20 MED ORDER — PIPERACILLIN-TAZOBACTAM 3.375 G IVPB
3.3750 g | Freq: Three times a day (TID) | INTRAVENOUS | Status: DC
  Administered 2016-05-21 – 2016-05-22 (×4): 3.375 g via INTRAVENOUS
  Filled 2016-05-20 (×7): qty 50

## 2016-05-20 MED ORDER — BOOST / RESOURCE BREEZE PO LIQD
1.0000 | Freq: Three times a day (TID) | ORAL | Status: DC
  Administered 2016-05-21 (×2): 1 via ORAL

## 2016-05-20 MED ORDER — PIPERACILLIN-TAZOBACTAM 3.375 G IVPB 30 MIN
3.3750 g | Freq: Once | INTRAVENOUS | Status: AC
  Administered 2016-05-20: 3.375 g via INTRAVENOUS
  Filled 2016-05-20: qty 50

## 2016-05-20 MED ORDER — PROMETHAZINE HCL 25 MG PO TABS
12.5000 mg | ORAL_TABLET | Freq: Four times a day (QID) | ORAL | Status: DC | PRN
Start: 2016-05-20 — End: 2016-05-24

## 2016-05-20 MED ORDER — ONDANSETRON 4 MG PO TBDP
ORAL_TABLET | ORAL | Status: AC
  Filled 2016-05-20: qty 1

## 2016-05-20 MED ORDER — ONDANSETRON 4 MG PO TBDP
4.0000 mg | ORAL_TABLET | Freq: Once | ORAL | Status: AC | PRN
  Administered 2016-05-20: 4 mg via ORAL

## 2016-05-20 MED ORDER — SODIUM CHLORIDE 0.9 % IV SOLN
INTRAVENOUS | Status: DC
  Administered 2016-05-20 – 2016-05-24 (×6): via INTRAVENOUS

## 2016-05-20 MED ORDER — ENOXAPARIN SODIUM 40 MG/0.4ML ~~LOC~~ SOLN
40.0000 mg | SUBCUTANEOUS | Status: DC
  Administered 2016-05-20 – 2016-05-23 (×4): 40 mg via SUBCUTANEOUS
  Filled 2016-05-20 (×4): qty 0.4

## 2016-05-20 MED ORDER — HYDROMORPHONE HCL 1 MG/ML IJ SOLN
1.0000 mg | INTRAMUSCULAR | Status: DC | PRN
Start: 2016-05-20 — End: 2016-05-24
  Administered 2016-05-20 – 2016-05-23 (×4): 1 mg via INTRAVENOUS
  Filled 2016-05-20 (×4): qty 1

## 2016-05-20 MED ORDER — INSULIN ASPART 100 UNIT/ML ~~LOC~~ SOLN
10.0000 [IU] | Freq: Once | SUBCUTANEOUS | Status: AC
  Administered 2016-05-20: 10 [IU] via INTRAVENOUS
  Filled 2016-05-20: qty 1

## 2016-05-20 MED ORDER — ACETAMINOPHEN 500 MG PO TABS
500.0000 mg | ORAL_TABLET | Freq: Four times a day (QID) | ORAL | Status: DC | PRN
  Administered 2016-05-22: 500 mg via ORAL
  Filled 2016-05-20: qty 1

## 2016-05-20 NOTE — ED Triage Notes (Addendum)
Pt presents with c/o diarrhea. The diarrhea began Wednesday. He reports chills, sweats, nausea, vomiting, abdominal pain. He has tried pepto bismol with no relief. He has not been able to tolerate any oral intake including his daily medications. His blood sugar has been running high although hes reduced his insulin dose due to decreased oral intake

## 2016-05-20 NOTE — Progress Notes (Signed)
Pharmacy Antibiotic Note  Lodema PilotRashaun Francesco RunnerHolden is a 31 y.o. male with DM1 and HIV on HAART admitted on 05/20/2016 with intra-abdominal infection.  Pharmacy has been consulted for Zosyn dosing. Patient is febrile and WBC low at 2.4, ANC 1.5. SCR elevated at 1.49. Zosyn 3.375g IV ordered x1.   Plan: Zosyn 3.375g IV q8h (4 hour infusion).  Monitor culture results, clinical status, and renal function.      Temp (24hrs), Avg:100.9 F (38.3 C), Min:99.1 F (37.3 C), Max:102.6 F (39.2 C)   Recent Labs Lab 05/20/16 1240 05/20/16 1253 05/20/16 1550 05/20/16 1848  WBC 2.4*  --   --   --   CREATININE 1.49*  --   --   --   LATICACIDVEN  --  4.39* 4.09* 0.3*    CrCl cannot be calculated (Unknown ideal weight.).    Allergies  Allergen Reactions  . Shellfish Allergy Anaphylaxis and Hives    Antimicrobials this admission: Zosyn 3/23 >>  Dose adjustments this admission:  Microbiology results: 3/23 Blood >> 3/23 Cdiff >> 3/23 GI panel >>  Thank you for allowing pharmacy to be a part of this patient's care.  Link SnufferJessica Karlei Waldo, PharmD, BCPS Clinical Pharmacist Clinical phone 05/20/2016 until 11 PM - [phone] After hours, please call #[phone] 05/20/2016 7:40 PM

## 2016-05-20 NOTE — ED Notes (Signed)
Report called  

## 2016-05-20 NOTE — Progress Notes (Signed)
CRITICAL VALUE ALERT  Critical value received:  Lactic acid Date of notification: 2.3 Time of notification:  2227 Critical value read back:Yes.    Nurse who received alert: Leandrew KoyanagiJoyce G. Jayquon Theiler,RN MD notified (1st page): Dr. Robb Matarrtiz  Time of first page: 2227 MD notified (2nd page): n/a  Time of second page:n/a  Responding MD: Dr. Robb Matarrtiz Time MD responded:  2230

## 2016-05-20 NOTE — ED Notes (Signed)
CRITICAL VALUE ALERT  Critical value received:  Lactic Acid 4.39  Date of notification:  05/20/2016   Time of notification:  1256  Critical value read back:Yes.    Nurse who received alert:  Heide GuileHope Porcha Deblanc  MD notified (1st page):  Dr. Dalene SeltzerSchlossman. No further orders at this time. Will get pt back to room asap

## 2016-05-20 NOTE — ED Provider Notes (Signed)
[facility]-EMERGENCY DEPT Provider Note   CSN: [id number] Arrival date & time: 05/20/16  1215     History   Chief Complaint Chief Complaint  Patient presents with  . GI Problem    HPI [name] is a 31 y.o. male.  HPI   Started feeling bad Wednesday morning then Thursday started throwing up, low appetite, every time tried to eat had diarrhea. Diarrhea on Wednesday began, has been every 15 minutes.  No black or bloody stool. Watery diarrhea.  Has had similar in the past with stomach virus.  Has thrown up twice today.   Lower abdominal pain/cramping. No RUQ pain.  No hx of surgery on abdomen.  Denies tylenol use. ETOH occasional.   Past Medical History:  Diagnosis Date  . Asthma    no prior hospitalizations, intubations  . Diabetes mellitus    Type I  . HIV (human immunodeficiency virus infection) [facility](HCC)     Patient Active Problem List   Diagnosis Date Noted  . Diarrhea 05/20/2016  . AKI (acute kidney injury) (HCC) 05/20/2016  . Dehydration 05/20/2016  . Screening examination for venereal disease 06/09/2015  . Encounter for long-term (current) use of medications 06/09/2015  . Depression 03/04/2015  . Dizziness and giddiness 03/04/2015  . Tobacco dependence 11/12/2014  . Human immunodeficiency virus (HIV) disease (HCC) 11/11/2014  . Stress 12/19/2012  . Headache(784.0) 01/04/2012  . Asthma 10/02/2010  . Diabetes mellitus type 1 (HCC) 03/01/1991    History reviewed. No pertinent surgical history.     Home Medications    Prior to Admission medications   Medication Sig Start Date End Date Taking? Authorizing Provider  acetaminophen (TYLENOL) 500 MG tablet Take 1 tablet (500 mg total) by mouth every 6 (six) hours as needed. 02/10/15  Yes Massie MaroonLachina M Hollis, FNP  Blood Glucose Monitoring Suppl (TRUE METRIX AIR GLUCOSE METER) DEVI 1 each by Does not apply route 4 (four) times daily -  with meals and at bedtime. 11/12/14  Yes Massie MaroonLachina M Hollis, FNP  GENVOYA 150-150-200-10  MG TABS tablet TAKE 1 TABLET BY MOUTH DAILY 04/04/16  Yes Gardiner Barefootobert W Comer, MD  glucose blood (TRUE METRIX BLOOD GLUCOSE TEST) test strip Use as instructed 11/12/14  Yes Olugbemiga E Hyman HopesJegede, MD  insulin aspart protamine - aspart (NOVOLOG MIX 70/30 FLEXPEN) (70-30) 100 UNIT/ML FlexPen Inject 50 units in the morning and 35 units in the evening 04/25/16  Yes Olugbemiga E Hyman HopesJegede, MD  Insulin Syringe-Needle U-100 (INSULIN SYRINGE 1CC/31GX5/16") 31G X 5/16" 1 ML MISC Use 3 times per day to inject insulin subcutaneous.  Dx code: 250.01 12/19/12  Yes Linward Headlandyan K Brown, MD  TRUEPLUS LANCETS 28G MISC USE AS DIRECTED 11/12/14  Yes Quentin Angstlugbemiga E Jegede, MD  citalopram (CELEXA) 20 MG tablet Take 1 tablet (20 mg total) by mouth daily. Patient not taking: Reported on 05/20/2016 03/03/16   Gardiner Barefootobert W Comer, MD    Family History Family History  Problem Relation Age of Onset  . Thyroid disease Mother   . Diabetes Mother   . Breast cancer Maternal Grandmother   . Diabetes Maternal Grandmother     Social History Social History  Substance Use Topics  . Smoking status: Former Smoker    Packs/day: 0.25    Types: Cigarettes    Start date: 09/29/2015  . Smokeless tobacco: Never Used  . Alcohol use 1.8 - 2.4 oz/week    1 Cans of beer, 2 Shots of liquor per week     Allergies   Shellfish allergy  Review of Systems Review of Systems  Constitutional: Positive for fatigue. Negative for fever.  HENT: Negative for sore throat.   Eyes: Negative for visual disturbance.  Respiratory: Negative for shortness of breath.   Cardiovascular: Negative for chest pain.  Gastrointestinal: Positive for abdominal pain, diarrhea, nausea and vomiting.  Genitourinary: Negative for difficulty urinating and dysuria.  Musculoskeletal: Negative for back pain and neck stiffness.  Skin: Negative for rash.  Neurological: Negative for syncope and headaches.     Physical Exam Updated Vital Signs BP 122/82   Pulse 100   Temp 99.1 F (37.3  C) (Oral)   Resp 16   SpO2 100%   Physical Exam  Constitutional: He is oriented to person, place, and time. He appears well-developed and well-nourished. No distress.  HENT:  Head: Normocephalic and atraumatic.  Mouth/Throat: Mucous membranes are dry.  Eyes: Conjunctivae and EOM are normal.  Neck: Normal range of motion.  Cardiovascular: Normal rate, regular rhythm, normal heart sounds and intact distal pulses.  Exam reveals no gallop and no friction rub.   No murmur heard. Pulmonary/Chest: Effort normal and breath sounds normal. No respiratory distress. He has no wheezes. He has no rales.  Abdominal: Soft. He exhibits no distension. There is tenderness (diffuse). There is no rebound, no guarding, no tenderness at McBurney's point and negative Murphy's sign.  Musculoskeletal: He exhibits no edema.  Neurological: He is alert and oriented to person, place, and time.  Skin: Skin is warm and dry. He is not diaphoretic.  Nursing note and vitals reviewed.    ED Treatments / Results  Labs (all labs ordered are listed, but only abnormal results are displayed) Labs Reviewed  COMPREHENSIVE METABOLIC PANEL - Abnormal; Notable for the following:       Result Value   Sodium 128 (*)    Potassium 5.2 (*)    Chloride 92 (*)    CO2 20 (*)    Glucose, Bld 416 (*)    Creatinine, Ser 1.49 (*)    Albumin 3.4 (*)    AST 162 (*)    ALT 223 (*)    Total Bilirubin 1.9 (*)    Anion gap 16 (*)    All other components within normal limits  CBC WITH DIFFERENTIAL/PLATELET - Abnormal; Notable for the following:    WBC 2.4 (*)    Hemoglobin 18.2 (*)    Neutro Abs 1.5 (*)    All other components within normal limits  URINALYSIS, ROUTINE W REFLEX MICROSCOPIC - Abnormal; Notable for the following:    Specific Gravity, Urine 1.032 (*)    Glucose, UA >=500 (*)    Hgb urine dipstick SMALL (*)    Protein, ur 100 (*)    All other components within normal limits  LIPASE, BLOOD - Abnormal; Notable for the  following:    Lipase <10 (*)    All other components within normal limits  I-STAT CG4 LACTIC ACID, ED - Abnormal; Notable for the following:    Lactic Acid, Venous 4.39 (*)    All other components within normal limits  CBG MONITORING, ED - Abnormal; Notable for the following:    Glucose-Capillary 461 (*)    All other components within normal limits  I-STAT VENOUS BLOOD GAS, ED - Abnormal; Notable for the following:    pH, Ven 7.478 (*)    pCO2, Ven 19.8 (*)    pO2, Ven 123.0 (*)    Bicarbonate 14.7 (*)    Acid-base deficit 5.0 (*)  All other components within normal limits  I-STAT CG4 LACTIC ACID, ED - Abnormal; Notable for the following:    Lactic Acid, Venous 4.09 (*)    All other components within normal limits  CBG MONITORING, ED - Abnormal; Notable for the following:    Glucose-Capillary 172 (*)    All other components within normal limits  C DIFFICILE QUICK SCREEN W PCR REFLEX  GASTROINTESTINAL PANEL BY PCR, STOOL (REPLACES STOOL CULTURE)  INFLUENZA PANEL BY PCR (TYPE A & B)  LACTIC ACID, PLASMA  LACTIC ACID, PLASMA    EKG  EKG Interpretation None       Radiology US Abdomen Limited Ruq  Result Date: 05/20/2016 CLINICAL DATA:  Patient with elevated LFTs. Diarrhea. Abdominal pain. EXAM: US ABDOMEN LIMITED - RIGHT UPPER QUADRANT COMPARISON:  None. FINDINGS: Gallbladder: No cholelithiasis. No gallbladder wall thickening. Unable to assess for [facility] sign given pain medication in the ER. Minimal pericholecystic fluid. Common bile duct: Diameter: 2.5 mm Liver: No focal lesion identified. Within normal limits in parenchymal echogenicity. IMPRESSION: No cholelithiasis or secondary signs to suggest acute cholecystitis. Electronically Signed   By: Annia Belt M.D.   On: 05/20/2016 17:58    Procedures Procedures (including critical care time)  Medications Ordered in ED Medications  0.9 %  sodium chloride infusion (not administered)  ondansetron (ZOFRAN-ODT)  disintegrating tablet 4 mg (4 mg Oral Given 05/20/16 1242)  sodium chloride 0.9 % bolus 1,000 mL (0 mLs Intravenous Stopped 05/20/16 1535)  sodium chloride 0.9 % bolus 1,000 mL (0 mLs Intravenous Stopped 05/20/16 1535)  insulin aspart (novoLOG) injection 10 Units (10 Units Intravenous Given 05/20/16 1513)  sodium chloride 0.9 % bolus 1,000 mL (1,000 mLs Intravenous New Bag/Given 05/20/16 1633)  sodium chloride 0.9 % bolus 1,000 mL (1,000 mLs Intravenous New Bag/Given 05/20/16 1632)  metroNIDAZOLE (FLAGYL) IVPB 500 mg (0 mg Intravenous Stopped 05/20/16 1802)     Initial Impression / Assessment and Plan / ED Course  I have reviewed the triage vital signs and the nursing notes.  Pertinent labs & imaging results that were available during my care of the patient were reviewed by me and considered in my medical decision making (see chart for details).    31 year old male with a history of diabetes type I, HIV with CD4 of 530 on last evaluation in November, asthma, presents with concern for diarrhea.  Patient reports episodes every 15 minutes for 3 days.  Suspect lactic acidosis is likely secondary to severe dehydration. Doubt sepsis/bacterial infection (other than possible cdiff).  Stool studies sent,  and pt given empiric metronidazole.  Glucose elevated with signs of mild acidosis however no clear signs of DKA. Pt given dose of IV insulin, suspect more likely lactic acidosis and hyperglycemia. His transaminases are elevated. He denies significant alcohol or Tylenol use. His abdominal exam is benign, and have low suspicion for cholecystitis, cholangitis.  Suspect viral etiology.   Patient admitted with concern of dehydration, lactic acidosis, severe diarrhea, hyperglycemia and mild elevation in transaminases.    Final Clinical Impressions(s) / ED Diagnoses   Final diagnoses:  Dehydration  Hyperglycemia  Diarrhea of presumed infectious origin  Lactic acidosis    New Prescriptions Current Discharge  Medication List       Alvira Monday, MD 05/20/16 Ernestina [place]

## 2016-05-20 NOTE — Progress Notes (Signed)
The patient earlier had a temperature of 102.6 degrees Fahrenheit. The chart was reviewed. His lactic acid came back low to 0.3 mmol/L. C. difficile was negative. His influenza and GI pathogens by PCR are still pending. Sepsis/SIRS order set retrieved. Blood cultures, chest radiograph, normal saline bolus, famotidine IV and Zosyn IVPB ordered. The patient had a when necessary order for Tylenol.  Sanda Kleinavid Eual Lindstrom, M.D. [phone].

## 2016-05-20 NOTE — Progress Notes (Signed)
Paged Dr. Robb Matarrtiz pt. with temp. 102.6 (O).

## 2016-05-20 NOTE — ED Notes (Signed)
Patient transported to Ultrasound 

## 2016-05-20 NOTE — ED Notes (Addendum)
Patient given water by RN per MD

## 2016-05-20 NOTE — ED Notes (Signed)
Given urinal for urine specimen.  Pt stated that he is unable to urinate at this time.

## 2016-05-20 NOTE — H&P (Addendum)
History and Physical    [name]:[id number] DOB: Jul 23, 1985 DOA: 05/20/2016  Referring MD/NP/PA: EDP PCP: No PCP Per Patient  Outpatient Specialists: ID Dr.Comer Patient coming from: Home  Chief Complaint: Diarrhea, weakness  HPI: [name] is a 31 y.o. male with medical history significant of DM 1 on Insulin, HIV on HAART -last CD4-530 and undetectable viral; load (11/17) presented to the ER today due to severe diarrhea ongoing for the last 3 days, which he describes as mostly watery at this point, without any blood or mucus associated with upper abdominal discomfort, subjective sensation of hot and cold, associated also with vomiting for the last 2-3 days. The vomitus is nonbloody and nonbilious. He denies any sick contacts, denies eating any uncooked food/seafood etc., denies any recent antibiotic use or any medication changes recently. He also reports that he's been taking lower doses of his insulin due to persistent vomiting and diarrhea hence being unable to eat much.  ED Course: Labs notable for hyponatremia, mild hyperkalemia, mild acute renal insufficiency, elevated AST ALT, mildly elevated bilirubin, elevated lactate, serum bicarbonate of 20 and a glucose of 416  Review of Systems: As per HPI otherwise 10 point review of systems negative.    Past Medical History:  Diagnosis Date  . Asthma    no prior hospitalizations, intubations  . Diabetes mellitus    Type I  . HIV (human immunodeficiency virus infection) (HCC)     History reviewed. No pertinent surgical history.   reports that he has quit smoking. His smoking use included Cigarettes. He started smoking about 7 months ago. He smoked 0.25 packs per day. He has never used smokeless tobacco. He reports that he drinks about 1.8 - 2.4 oz of alcohol per week . He reports that he uses drugs, including Marijuana, about 2 times per week.  Allergies  Allergen Reactions  . Shellfish Allergy Anaphylaxis and Hives     Family History  Problem Relation Age of Onset  . Thyroid disease Mother   . Diabetes Mother   . Breast cancer Maternal Grandmother   . Diabetes Maternal Grandmother     Prior to Admission medications   Medication Sig Start Date End Date Taking? Authorizing Provider  acetaminophen (TYLENOL) 500 MG tablet Take 1 tablet (500 mg total) by mouth every 6 (six) hours as needed. 02/10/15  Yes Massie Maroon, FNP  Blood Glucose Monitoring Suppl (TRUE METRIX AIR GLUCOSE METER) DEVI 1 each by Does not apply route 4 (four) times daily -  with meals and at bedtime. 11/12/14  Yes Massie Maroon, FNP  GENVOYA 150-150-200-10 MG TABS tablet TAKE 1 TABLET BY MOUTH DAILY 04/04/16  Yes Gardiner Barefoot, MD  glucose blood (TRUE METRIX BLOOD GLUCOSE TEST) test strip Use as instructed 11/12/14  Yes Olugbemiga E Hyman Hopes, MD  insulin aspart protamine - aspart (NOVOLOG MIX 70/30 FLEXPEN) (70-30) 100 UNIT/ML FlexPen Inject 50 units in the morning and 35 units in the evening 04/25/16  Yes Olugbemiga E Hyman Hopes, MD  Insulin Syringe-Needle U-100 (INSULIN SYRINGE 1CC/31GX5/16") 31G X 5/16" 1 ML MISC Use 3 times per day to inject insulin subcutaneous.  Dx code: 250.01 12/19/12  Yes Linward Headland, MD  TRUEPLUS LANCETS 28G MISC USE AS DIRECTED 11/12/14  Yes Quentin Angst, MD  citalopram (CELEXA) 20 MG tablet Take 1 tablet (20 mg total) by mouth daily. Patient not taking: Reported on 05/20/2016 03/03/16   Gardiner Barefoot, MD    Physical Exam: Vitals:   05/20/16  1400 05/20/16 1445 05/20/16 1500 05/20/16 1515  BP: 128/80 121/83 (!) 134/92 119/79  Pulse: (!) 101 (!) 105  (!) 102  Resp: 12 (!) 22 (!) 27 (!) 23  Temp:      TempSrc:      SpO2: 99% 96%  98%      Constitutional: alert, awake, uncomfortable, no distress Vitals:   05/20/16 1400 05/20/16 1445 05/20/16 1500 05/20/16 1515  BP: 128/80 121/83 (!) 134/92 119/79  Pulse: (!) 101 (!) 105  (!) 102  Resp: 12 (!) 22 (!) 27 (!) 23  Temp:      TempSrc:      SpO2:  99% 96%  98%   Eyes: PERRL, lids and conjunctivae normal ENMT: Mucous membranes are moist. Posterior pharynx clear of any exudate or lesions.Normal dentition.  Neck: normal, supple, no masses, no thyromegaly Respiratory: clear to auscultation bilaterally, no wheezing, no crackles. Normal respiratory effort. No accessory muscle use.  Cardiovascular: Regular rate and rhythm, no murmurs / rubs / gallops. No extremity edema. 2+ pedal pulses. No carotid bruits.  Abdomen: soft, mild diffuse tenderness esp in epigastrium, Pos BS, no RUQ tenderness, no rigidity or rebound Musculoskeletal: No joint deformity upper and lower extremities.  Skin: no rashes, lesions, ulcers. No induration Neurologic: CN 2-12 grossly intact. Sensation intact, DTR normal. Strength 5/5 in all 4.  Psychiatric: Normal judgment and insight. Alert and oriented x 3. Normal mood.    Labs on Admission: I have personally reviewed following labs and imaging studies  CBC:  Recent Labs Lab 05/20/16 1240  WBC 2.4*  NEUTROABS 1.5*  HGB 18.2*  HCT 50.6  MCV 88.8  PLT 176   Basic Metabolic Panel:  Recent Labs Lab 05/20/16 1240  NA 128*  K 5.2*  CL 92*  CO2 20*  GLUCOSE 416*  BUN 15  CREATININE 1.49*  CALCIUM 9.2   GFR: CrCl cannot be calculated (Unknown ideal weight.). Liver Function Tests:  Recent Labs Lab 05/20/16 1240  AST 162*  ALT 223*  ALKPHOS 111  BILITOT 1.9*  PROT 8.0  ALBUMIN 3.4*   No results for input(s): LIPASE, AMYLASE in the last 168 hours. No results for input(s): AMMONIA in the last 168 hours. Coagulation Profile: No results for input(s): INR, PROTIME in the last 168 hours. Cardiac Enzymes: No results for input(s): CKTOTAL, CKMB, CKMBINDEX, TROPONINI in the last 168 hours. BNP (last 3 results) No results for input(s): PROBNP in the last 8760 hours. HbA1C: No results for input(s): HGBA1C in the last 72 hours. CBG:  Recent Labs Lab 05/20/16 1315  GLUCAP 461*   Lipid  Profile: No results for input(s): CHOL, HDL, LDLCALC, TRIG, CHOLHDL, LDLDIRECT in the last 72 hours. Thyroid Function Tests: No results for input(s): TSH, T4TOTAL, FREET4, T3FREE, THYROIDAB in the last 72 hours. Anemia Panel: No results for input(s): VITAMINB12, FOLATE, FERRITIN, TIBC, IRON, RETICCTPCT in the last 72 hours. Urine analysis:    Component Value Date/Time   COLORURINE YELLOW 05/20/2016 1608   APPEARANCEUR CLEAR 05/20/2016 1608   LABSPEC 1.032 (H) 05/20/2016 1608   PHURINE 5.0 05/20/2016 1608   GLUCOSEU >=500 (A) 05/20/2016 1608   HGBUR SMALL (A) 05/20/2016 1608   BILIRUBINUR NEGATIVE 05/20/2016 1608   KETONESUR NEGATIVE 05/20/2016 1608   PROTEINUR 100 (A) 05/20/2016 1608   UROBILINOGEN 0.2 02/10/2015 1416   NITRITE NEGATIVE 05/20/2016 1608   LEUKOCYTESUR NEGATIVE 05/20/2016 1608   Sepsis Labs: @LABRCNTIP (procalcitonin:4,lacticidven:4) )No results found for this or any previous visit (from the past 240 hour(s)).  Radiological Exams on Admission: No results found.  Assessment/Plan Active Problems:  Nausea/Vomiting/Diarrhea -suspect infectious, could be a viral gastroenteritis, he is not immunosuppressed hence I do not suspect opportunistic infections -check GI pathogen panel and C. difficile PCR -Also check right upper quadrant ultrasound due to abnormal LFTs and bilirubin, clinically does not have Murphy sign on exam -Status post 4 L normal saline bolus will start normal saline at 125 mL an hour -Supportive care with clears, anti-emetics, fluids -if does not improve with above management will need more imaging  Dehydration with lactic acidosis -Hydrate as above, will repeat lactate this evening  Diabetes mellitus type 1 (HCC) -Resume insulin 7030 at 30 units twice a day, already got a dose of NovoLog in the ER, I suspect his mild acidosis is related to dehydration and renal insufficiency as opposed to DKA   AKI -Due to dehydration, hydrate  aggressively -Cmet in a.m.  Human immunodeficiency virus (HIV) disease (HCC) -Last CD4 count of 530 and undetectable viral load -Continue HAART  Tobacco dependence  DVT prophylaxis: lovenox Code Status: Full Code Family Communication: None at bedside Disposition Plan: home when improved Admission status: inpatient   Zannie Cove MD [facility] Pager [phone]  If 7PM-7AM, please contact night-coverage [email or web address] Password [facility]  05/20/2016, 4:38 PM

## 2016-05-21 DIAGNOSIS — E860 Dehydration: Secondary | ICD-10-CM

## 2016-05-21 LAB — COMPREHENSIVE METABOLIC PANEL
ALT: 110 U/L — ABNORMAL HIGH (ref 17–63)
AST: 65 U/L — AB (ref 15–41)
Albumin: 2.2 g/dL — ABNORMAL LOW (ref 3.5–5.0)
Alkaline Phosphatase: 71 U/L (ref 38–126)
Anion gap: 10 (ref 5–15)
BILIRUBIN TOTAL: 1.1 mg/dL (ref 0.3–1.2)
BUN: 13 mg/dL (ref 6–20)
CHLORIDE: 101 mmol/L (ref 101–111)
CO2: 20 mmol/L — ABNORMAL LOW (ref 22–32)
CREATININE: 1.29 mg/dL — AB (ref 0.61–1.24)
Calcium: 7.8 mg/dL — ABNORMAL LOW (ref 8.9–10.3)
Glucose, Bld: 146 mg/dL — ABNORMAL HIGH (ref 65–99)
POTASSIUM: 3.9 mmol/L (ref 3.5–5.1)
Sodium: 131 mmol/L — ABNORMAL LOW (ref 135–145)
TOTAL PROTEIN: 5.8 g/dL — AB (ref 6.5–8.1)

## 2016-05-21 LAB — CBC
HEMATOCRIT: 39.6 % (ref 39.0–52.0)
Hemoglobin: 14.1 g/dL (ref 13.0–17.0)
MCH: 31.7 pg (ref 26.0–34.0)
MCHC: 35.6 g/dL (ref 30.0–36.0)
MCV: 89 fL (ref 78.0–100.0)
PLATELETS: 158 10*3/uL (ref 150–400)
RBC: 4.45 MIL/uL (ref 4.22–5.81)
RDW: 12.8 % (ref 11.5–15.5)
WBC: 4.4 10*3/uL (ref 4.0–10.5)

## 2016-05-21 LAB — GASTROINTESTINAL PANEL BY PCR, STOOL (REPLACES STOOL CULTURE)
ADENOVIRUS F40/41: NOT DETECTED
Astrovirus: NOT DETECTED
CAMPYLOBACTER SPECIES: NOT DETECTED
CRYPTOSPORIDIUM: NOT DETECTED
CYCLOSPORA CAYETANENSIS: NOT DETECTED
ENTEROPATHOGENIC E COLI (EPEC): NOT DETECTED
ENTEROTOXIGENIC E COLI (ETEC): NOT DETECTED
Entamoeba histolytica: NOT DETECTED
Enteroaggregative E coli (EAEC): NOT DETECTED
GIARDIA LAMBLIA: NOT DETECTED
Norovirus GI/GII: NOT DETECTED
PLESIMONAS SHIGELLOIDES: NOT DETECTED
ROTAVIRUS A: NOT DETECTED
SAPOVIRUS (I, II, IV, AND V): NOT DETECTED
SHIGELLA/ENTEROINVASIVE E COLI (EIEC): DETECTED — AB
Salmonella species: NOT DETECTED
Shiga like toxin producing E coli (STEC): NOT DETECTED
Vibrio cholerae: NOT DETECTED
Vibrio species: NOT DETECTED
Yersinia enterocolitica: NOT DETECTED

## 2016-05-21 LAB — GLUCOSE, CAPILLARY
GLUCOSE-CAPILLARY: 132 mg/dL — AB (ref 65–99)
GLUCOSE-CAPILLARY: 176 mg/dL — AB (ref 65–99)
GLUCOSE-CAPILLARY: 181 mg/dL — AB (ref 65–99)

## 2016-05-21 MED ORDER — BOOST / RESOURCE BREEZE PO LIQD
1.0000 | Freq: Two times a day (BID) | ORAL | Status: DC
Start: 2016-05-22 — End: 2016-05-24
  Administered 2016-05-22 – 2016-05-24 (×5): 1 via ORAL

## 2016-05-21 NOTE — Plan of Care (Signed)
Problem: Education: Goal: Knowledge of [facility] Education information/materials will improve Outcome: Progressing POC reviewed with pt.   

## 2016-05-21 NOTE — Progress Notes (Signed)
PROGRESS NOTE    [name]  ZOX:[id number] DOB: 07-22-1985 DOA: 05/20/2016 PCP: No PCP Per Patient  Brief Narrative:[name] is a 31 y.o. male with medical history significant of DM 1 on Insulin, HIV on HAART -last CD4-530 and undetectable viral; load (11/17) presented to the ER today due to severe diarrhea ongoing for the last 3 days, which he describes as mostly watery at this point, without any blood or mucus associated with upper abdominal discomfort, subjective sensation of hot and cold, associated also with vomiting for the last 2-3 days   Assessment & Plan:   Nausea/Vomiting/Diarrhea -suspect infectious, could be a viral gastroenteritis, he is not immunosuppressed hence I do not suspect opportunistic infections -FU GI pathogen panel , FU C. difficile PCR -Also check right upper quadrant ultrasound due to abnormal LFTs and bilirubin, clinically does not have Murphy sign on exam -continue IVF, supportive care with clears, anti-emetics, fluids -if does not show improvement, will get further imaging  Dehydration with lactic acidosis -improving with IVF  Diabetes mellitus type 1 (HCC) - continue 70/30 at 30 units twice a day -stable  AKI -Due to dehydration, hydrate aggressively -improving  Human immunodeficiency virus (HIV) disease (HCC) -Last CD4 count of 530 and undetectable viral load -Continue HAART  Tobacco dependence  DVT prophylaxis: lovenox Code Status: Full Code Family Communication: None at bedside Disposition Plan: home when improved, 1-2days  Consultants:    Antimicrobials:   none   Subjective: Still having diarrhea, otherwise feels better  Objective: Vitals:   05/20/16 1815 05/20/16 1925 05/20/16 2220 05/21/16 0639  BP: 122/82 133/77 (!) 117/55 113/71  Pulse: 100 100 (!) 103 93  Resp: 16 16 18 18   Temp:  (!) 102.6 F (39.2 C) 97.8 F (36.6 C) 98.8 F (37.1 C)  TempSrc:      SpO2: 100%  100%     Intake/Output Summary (Last  24 hours) at 05/21/16 1357 Last data filed at 05/20/16 1802  Gross per 24 hour  Intake             2100 ml  Output                0 ml  Net             2100 ml   There were no vitals filed for this visit.  Examination:  General exam: Appears calm but uncomfortable  Respiratory system: Clear to auscultation. Respiratory effort normal. Cardiovascular system: S1 & S2 heard, RRR. No JVD, murmurs, rubs, gallops or clicks. No pedal edema. Gastrointestinal system: Abdomen is nondistended, soft and nontender. Normal bowel sounds heard. Central nervous system: Alert and oriented. No focal neurological deficits. Extremities: Symmetric 5 x 5 power. Skin: No rashes, lesions or ulcers Psychiatry: Judgement and insight appear normal. Mood & affect appropriate.     Data Reviewed:   CBC:  Recent Labs Lab 05/20/16 1240 05/21/16 0450  WBC 2.4* 4.4  NEUTROABS 1.5*  --   HGB 18.2* 14.1  HCT 50.6 39.6  MCV 88.8 89.0  PLT 176 158   Basic Metabolic Panel:  Recent Labs Lab 05/20/16 1240 05/21/16 0450  NA 128* 131*  K 5.2* 3.9  CL 92* 101  CO2 20* 20*  GLUCOSE 416* 146*  BUN 15 13  CREATININE 1.49* 1.29*  CALCIUM 9.2 7.8*   GFR: CrCl cannot be calculated (Unknown ideal weight.). Liver Function Tests:  Recent Labs Lab 05/20/16 1240 05/21/16 0450  AST 162* 65*  ALT 223* 110*  ALKPHOS  111 71  BILITOT 1.9* 1.1  PROT 8.0 5.8*  ALBUMIN 3.4* 2.2*    Recent Labs Lab 05/20/16 1247  LIPASE <10*   No results for input(s): AMMONIA in the last 168 hours. Coagulation Profile:  Recent Labs Lab 05/20/16 2031  INR 1.50   Cardiac Enzymes: No results for input(s): CKTOTAL, CKMB, CKMBINDEX, TROPONINI in the last 168 hours. BNP (last 3 results) No results for input(s): PROBNP in the last 8760 hours. HbA1C: No results for input(s): HGBA1C in the last 72 hours. CBG:  Recent Labs Lab 05/20/16 1315 05/20/16 1817 05/21/16 0004 05/21/16 0815  GLUCAP 461* 172* 132* 176*    Lipid Profile: No results for input(s): CHOL, HDL, LDLCALC, TRIG, CHOLHDL, LDLDIRECT in the last 72 hours. Thyroid Function Tests: No results for input(s): TSH, T4TOTAL, FREET4, T3FREE, THYROIDAB in the last 72 hours. Anemia Panel: No results for input(s): VITAMINB12, FOLATE, FERRITIN, TIBC, IRON, RETICCTPCT in the last 72 hours. Urine analysis:    Component Value Date/Time   COLORURINE YELLOW 05/20/2016 1608   APPEARANCEUR CLEAR 05/20/2016 1608   LABSPEC 1.032 (H) 05/20/2016 1608   PHURINE 5.0 05/20/2016 1608   GLUCOSEU >=500 (A) 05/20/2016 1608   HGBUR SMALL (A) 05/20/2016 1608   BILIRUBINUR NEGATIVE 05/20/2016 1608   KETONESUR NEGATIVE 05/20/2016 1608   PROTEINUR 100 (A) 05/20/2016 1608   UROBILINOGEN 0.2 02/10/2015 1416   NITRITE NEGATIVE 05/20/2016 1608   LEUKOCYTESUR NEGATIVE 05/20/2016 1608   Sepsis Labs: @LABRCNTIP (procalcitonin:4,lacticidven:4)  ) Recent Results (from the past 240 hour(s))  C difficile quick scan w PCR reflex     Status: None   Collection Time: 05/20/16  3:43 PM  Result Value Ref Range Status   C Diff antigen NEGATIVE NEGATIVE Final   C Diff toxin NEGATIVE NEGATIVE Final   C Diff interpretation No C. difficile detected.  Final  Culture, blood (x 2)     Status: None (Preliminary result)   Collection Time: 05/20/16  8:20 PM  Result Value Ref Range Status   Specimen Description BLOOD LEFT ANTECUBITAL  Final   Special Requests IN PEDIATRIC BOTTLE 3CC  Final   Culture NO GROWTH < 24 HOURS  Final   Report Status PENDING  Incomplete  Culture, blood (x 2)     Status: None (Preliminary result)   Collection Time: 05/20/16  8:32 PM  Result Value Ref Range Status   Specimen Description BLOOD LEFT HAND  Final   Special Requests IN PEDIATRIC BOTTLE 2CC  Final   Culture NO GROWTH < 24 HOURS  Final   Report Status PENDING  Incomplete         Radiology Studies: Dg Chest Port 1 View  Result Date: 05/20/2016 CLINICAL DATA:  Code sepsis EXAM: PORTABLE  CHEST 1 VIEW COMPARISON:  11/26/2014 FINDINGS: Non inclusion of the CP angles. No acute infiltrate or effusion. Stable cardiomediastinal silhouette. No pneumothorax. IMPRESSION: No active disease. Electronically Signed   By: Jasmine PangKim  Fujinaga M.D.   On: 05/20/2016 21:18   [place] Abdomen Limited Ruq  Result Date: 05/20/2016 CLINICAL DATA:  Patient with elevated LFTs. Diarrhea. Abdominal pain. EXAM: US ABDOMEN LIMITED - RIGHT UPPER QUADRANT COMPARISON:  None. FINDINGS: Gallbladder: No cholelithiasis. No gallbladder wall thickening. Unable to assess for [facility] sign given pain medication in the ER. Minimal pericholecystic fluid. Common bile duct: Diameter: 2.5 mm Liver: No focal lesion identified. Within normal limits in parenchymal echogenicity. IMPRESSION: No cholelithiasis or secondary signs to suggest acute cholecystitis. Electronically Signed   By: Francis Gainesrew  Davis M.D.  On: 05/20/2016 17:58        Scheduled Meds: . elvitegravir-cobicistat-emtricitabine-tenofovir  1 tablet Oral Daily  . enoxaparin (LOVENOX) injection  40 mg Subcutaneous Q24H  . famotidine (PEPCID) IV  20 mg Intravenous Q12H  . feeding supplement  1 Container Oral TID BM  . insulin aspart protamine- aspart  30 Units Subcutaneous BID WC  . piperacillin-tazobactam (ZOSYN)  IV  3.375 g Intravenous Q8H   Continuous Infusions: . sodium chloride 125 mL/hr at 05/21/16 1055     LOS: 1 day    Time spent:    Zannie Cove, MD [facility] Pager [phone]  If 7PM-7AM, please contact night-coverage [email or web address] Password [facility] 05/21/2016, 1:57 PM

## 2016-05-21 NOTE — Progress Notes (Signed)
Initial Nutrition Assessment  DOCUMENTATION CODES:   Not applicable  INTERVENTION:   -Decrease Boost Breeze po to BID, each supplement provides 250 kcal and 9 grams of protein  NUTRITION DIAGNOSIS:   Inadequate oral intake related to altered GI function as evidenced by per patient/family report, meal completion < 50%.  GOAL:   Patient will meet greater than or equal to 90% of their needs  MONITOR:   PO intake, Supplement acceptance, Diet advancement, Labs, Weight trends, Skin, I & O's  REASON FOR ASSESSMENT:   Malnutrition Screening Tool    ASSESSMENT:    [name] is a 31 y.o. male with medical history significant of DM 1 on Insulin, HIV on HAART -last CD4-530 and undetectable viral; load (11/17) presented to the ER today due to severe diarrhea ongoing for the last 3 days, which he describes as mostly watery at this point, without any blood or mucus associated with upper abdominal discomfort, subjective sensation of hot and cold, associated also with vomiting for the last 2-3 days. The vomitus is nonbloody and nonbilious.  Pt admitted with nausea, vomiting, and diarrhea. Per MD notes, pt with vital gastroenteritis.   Spoke with pt at bedside, who reports feeling generally unwell. He reports frustration with uncertain diagnosis. He shares that he has been unable to keep food and liquids down over the past 3 days. When asked about clear liquid breakfast, he reports "it just all runs right through me".   Pt reports fair appetite related to acute illness. He shares he typically consumes 2 meals per day (Breakfast: cereal and bagel, Dinner: large bowl of ramen noodles). Beverages include mainly water.   Pt denies any recent wt loss, however, suspect he lost "a few pounds" over the past 3 days. Unsure of UBW. No current wt available.   Home DM regimen is 30 units 70/30 insulin BID. No recent Hgb A1c available to assess control.   Nutrition-Focused physical exam completed.  Findings are no fat depletion, no muscle depletion, and no edema. Pt reports he is very active at baseline and has a thin, athletic body stature.  Labs reviewed: CBGS: 132-176.   Diet Order:  Diet clear liquid Room service appropriate? Yes; Fluid consistency: Thin  Skin:  Reviewed, no issues  Last BM:  05/21/16  Height:   Ht Readings from Last 1 Encounters:  03/03/16 6' (1.829 m)    Weight:   Wt Readings from Last 1 Encounters:  03/03/16 188 lb (85.3 kg)    Ideal Body Weight:  80.9 kg  BMI:  Estimated body mass index is 25.5 kg/m as calculated from the following:   Height as of 03/03/16: 6' (1.829 m).   Weight as of 03/03/16: 188 lb (85.3 kg).  Estimated Nutritional Needs:   Kcal:  2200-2400  Protein:  110-125 grams  Fluid:  2.2.-2.4 L  EDUCATION NEEDS:   Education needs no appropriate at this time  Marializ Ferrebee A. Mayford KnifeWilliams, RD, LDN, CDE Pager: [phone] After hours Pager: [phone]

## 2016-05-22 DIAGNOSIS — B20 Human immunodeficiency virus [HIV] disease: Secondary | ICD-10-CM

## 2016-05-22 LAB — GLUCOSE, CAPILLARY
GLUCOSE-CAPILLARY: 232 mg/dL — AB (ref 65–99)
GLUCOSE-CAPILLARY: 56 mg/dL — AB (ref 65–99)
GLUCOSE-CAPILLARY: 82 mg/dL (ref 65–99)
Glucose-Capillary: 119 mg/dL — ABNORMAL HIGH (ref 65–99)
Glucose-Capillary: 326 mg/dL — ABNORMAL HIGH (ref 65–99)

## 2016-05-22 MED ORDER — LOPERAMIDE HCL 2 MG PO CAPS
2.0000 mg | ORAL_CAPSULE | Freq: Once | ORAL | Status: AC
  Administered 2016-05-22: 2 mg via ORAL
  Filled 2016-05-22: qty 1

## 2016-05-22 MED ORDER — FAMOTIDINE 20 MG PO TABS
20.0000 mg | ORAL_TABLET | Freq: Two times a day (BID) | ORAL | Status: DC
  Administered 2016-05-22 – 2016-05-24 (×4): 20 mg via ORAL
  Filled 2016-05-22 (×4): qty 1

## 2016-05-22 MED ORDER — CIPROFLOXACIN HCL 500 MG PO TABS
500.0000 mg | ORAL_TABLET | Freq: Two times a day (BID) | ORAL | Status: DC
  Administered 2016-05-22 – 2016-05-24 (×5): 500 mg via ORAL
  Filled 2016-05-22 (×5): qty 1

## 2016-05-22 MED ORDER — INSULIN ASPART PROT & ASPART (70-30 MIX) 100 UNIT/ML ~~LOC~~ SUSP
40.0000 [IU] | Freq: Two times a day (BID) | SUBCUTANEOUS | Status: DC
  Administered 2016-05-22 – 2016-05-24 (×4): 40 [IU] via SUBCUTANEOUS
  Filled 2016-05-22 (×2): qty 10

## 2016-05-22 NOTE — Progress Notes (Signed)
PROGRESS NOTE    [name]  ZOX:[id number] DOB: Apr 25, 1985 DOA: 05/20/2016 PCP: No PCP Per Patient  Brief Narrative:[name] is a 31 y.o. male with medical history significant of DM 1 on Insulin, HIV on HAART -last CD4-530 and undetectable viral; load (11/17) presented to the ER today due to severe diarrhea ongoing for the last 3 days, which he describes as mostly watery at this point, without any blood or mucus associated with upper abdominal discomfort, subjective sensation of hot and cold, associated also with vomiting for the last 2-3 days   Assessment & Plan:   Severe gastroenteritis due to Shigella -Still has severe profuse diarrhea -Start ciprofloxacin today GI pathogen panel overnight positive for Shigella -IV fluids, clears, advance diet as tolerated  Dehydration with lactic acidosis -improving with IVF  Diabetes mellitus type 1 (HCC) - continue 70/30 at 30 units twice a day -stable  AKI -Due to dehydration, hydrate aggressively -improving  Human immunodeficiency virus (HIV) disease (HCC) -Last CD4 count of 530 and undetectable viral load -Continue HAART  Tobacco dependence  DVT prophylaxis: lovenox Code Status: Full Code Family Communication: None at bedside Disposition Plan: home tomorrow if better   Consultants:    Antimicrobials:   Ciprofloxacin   Subjective: Still having diarrhea-6-10 episodes overnight, otherwise feels better  Objective: Vitals:   05/20/16 2220 05/21/16 0639 05/21/16 2147 05/22/16 0530  BP: (!) 117/55 113/71 124/71 112/75  Pulse: (!) 103 93 77 (!) 49  Resp: 18 18 18 18   Temp: 97.8 F (36.6 C) 98.8 F (37.1 C) 99.9 F (37.7 C) 98 F (36.7 C)  TempSrc:   Oral Oral  SpO2: 100%  100% 100%  Weight:    82.6 kg (182 lb)  Height:    6' (1.829 m)    Intake/Output Summary (Last 24 hours) at 05/22/16 1128 Last data filed at 05/22/16 0030  Gross per 24 hour  Intake          1312.92 ml  Output                0 ml    Net          1312.92 ml   Filed Weights   05/22/16 0530  Weight: 82.6 kg (182 lb)    Examination:  General exam: Appears calm, Appears more comfortable Respiratory system: Clear to auscultation. Respiratory effort normal. Cardiovascular system: S1 & S2 heard, RRR. No JVD, murmurs, rubs, gallops or clicks. No pedal edema. Gastrointestinal system: Abdomen is nondistended, soft and nontender. Normal bowel sounds heard. Central nervous system: Alert and oriented. No focal neurological deficits. Extremities: Symmetric 5 x 5 power. Skin: No rashes, lesions or ulcers Psychiatry: Judgement and insight appear normal. Mood & affect appropriate.     Data Reviewed:   CBC:  Recent Labs Lab 05/20/16 1240 05/21/16 0450  WBC 2.4* 4.4  NEUTROABS 1.5*  --   HGB 18.2* 14.1  HCT 50.6 39.6  MCV 88.8 89.0  PLT 176 158   Basic Metabolic Panel:  Recent Labs Lab 05/20/16 1240 05/21/16 0450  NA 128* 131*  K 5.2* 3.9  CL 92* 101  CO2 20* 20*  GLUCOSE 416* 146*  BUN 15 13  CREATININE 1.49* 1.29*  CALCIUM 9.2 7.8*   GFR: Estimated Creatinine Clearance: 92.7 mL/min (A) (by C-G formula based on SCr of 1.29 mg/dL (H)). Liver Function Tests:  Recent Labs Lab 05/20/16 1240 05/21/16 0450  AST 162* 65*  ALT 223* 110*  ALKPHOS 111 71  BILITOT 1.9*  1.1  PROT 8.0 5.8*  ALBUMIN 3.4* 2.2*    Recent Labs Lab 05/20/16 1247  LIPASE <10*   No results for input(s): AMMONIA in the last 168 hours. Coagulation Profile:  Recent Labs Lab 05/20/16 2031  INR 1.50   Cardiac Enzymes: No results for input(s): CKTOTAL, CKMB, CKMBINDEX, TROPONINI in the last 168 hours. BNP (last 3 results) No results for input(s): PROBNP in the last 8760 hours. HbA1C: No results for input(s): HGBA1C in the last 72 hours. CBG:  Recent Labs Lab 05/21/16 0815 05/21/16 1657 05/22/16 0028 05/22/16 0052 05/22/16 0745  GLUCAP 176* 181* 56* 82 119*   Lipid Profile: No results for input(s): CHOL, HDL,  LDLCALC, TRIG, CHOLHDL, LDLDIRECT in the last 72 hours. Thyroid Function Tests: No results for input(s): TSH, T4TOTAL, FREET4, T3FREE, THYROIDAB in the last 72 hours. Anemia Panel: No results for input(s): VITAMINB12, FOLATE, FERRITIN, TIBC, IRON, RETICCTPCT in the last 72 hours. Urine analysis:    Component Value Date/Time   COLORURINE YELLOW 05/20/2016 1608   APPEARANCEUR CLEAR 05/20/2016 1608   LABSPEC 1.032 (H) 05/20/2016 1608   PHURINE 5.0 05/20/2016 1608   GLUCOSEU >=500 (A) 05/20/2016 1608   HGBUR SMALL (A) 05/20/2016 1608   BILIRUBINUR NEGATIVE 05/20/2016 1608   KETONESUR NEGATIVE 05/20/2016 1608   PROTEINUR 100 (A) 05/20/2016 1608   UROBILINOGEN 0.2 02/10/2015 1416   NITRITE NEGATIVE 05/20/2016 1608   LEUKOCYTESUR NEGATIVE 05/20/2016 1608   Sepsis Labs: @LABRCNTIP (procalcitonin:4,lacticidven:4)  ) Recent Results (from the past 240 hour(s))  C difficile quick scan w PCR reflex     Status: None   Collection Time: 05/20/16  3:43 PM  Result Value Ref Range Status   C Diff antigen NEGATIVE NEGATIVE Final   C Diff toxin NEGATIVE NEGATIVE Final   C Diff interpretation No C. difficile detected.  Final  Gastrointestinal Panel by PCR , Stool     Status: Abnormal   Collection Time: 05/20/16  3:43 PM  Result Value Ref Range Status   Campylobacter species NOT DETECTED NOT DETECTED Final   Plesimonas shigelloides NOT DETECTED NOT DETECTED Final   Salmonella species NOT DETECTED NOT DETECTED Final   Yersinia enterocolitica NOT DETECTED NOT DETECTED Final   Vibrio species NOT DETECTED NOT DETECTED Final   Vibrio cholerae NOT DETECTED NOT DETECTED Final   Enteroaggregative E coli (EAEC) NOT DETECTED NOT DETECTED Final   Enteropathogenic E coli (EPEC) NOT DETECTED NOT DETECTED Final   Enterotoxigenic E coli (ETEC) NOT DETECTED NOT DETECTED Final   Shiga like toxin producing E coli (STEC) NOT DETECTED NOT DETECTED Final   Shigella/Enteroinvasive E coli (EIEC) DETECTED (A) NOT  DETECTED Final    Comment: RESULT CALLED TO, READ BACK BY AND VERIFIED WITH: JAIME BLUE ON 05/21/16 AT 1504 SRC    Cryptosporidium NOT DETECTED NOT DETECTED Final   Cyclospora cayetanensis NOT DETECTED NOT DETECTED Final   Entamoeba histolytica NOT DETECTED NOT DETECTED Final   Giardia lamblia NOT DETECTED NOT DETECTED Final   Adenovirus F40/41 NOT DETECTED NOT DETECTED Final   Astrovirus NOT DETECTED NOT DETECTED Final   Norovirus GI/GII NOT DETECTED NOT DETECTED Final   Rotavirus A NOT DETECTED NOT DETECTED Final   Sapovirus (I, II, IV, and V) NOT DETECTED NOT DETECTED Final  Culture, blood (x 2)     Status: None (Preliminary result)   Collection Time: 05/20/16  8:20 PM  Result Value Ref Range Status   Specimen Description BLOOD LEFT ANTECUBITAL  Final   Special Requests IN PEDIATRIC  BOTTLE 3CC  Final   Culture NO GROWTH < 24 HOURS  Final   Report Status PENDING  Incomplete  Culture, blood (x 2)     Status: None (Preliminary result)   Collection Time: 05/20/16  8:32 PM  Result Value Ref Range Status   Specimen Description BLOOD LEFT HAND  Final   Special Requests IN PEDIATRIC BOTTLE 2CC  Final   Culture NO GROWTH < 24 HOURS  Final   Report Status PENDING  Incomplete         Radiology Studies: Dg Chest Port 1 View  Result Date: 05/20/2016 CLINICAL DATA:  Code sepsis EXAM: PORTABLE CHEST 1 VIEW COMPARISON:  11/26/2014 FINDINGS: Non inclusion of the CP angles. No acute infiltrate or effusion. Stable cardiomediastinal silhouette. No pneumothorax. IMPRESSION: No active disease. Electronically Signed   By: Jasmine PangKim  Fujinaga M.D.   On: 05/20/2016 21:18   [place] Abdomen Limited Ruq  Result Date: 05/20/2016 CLINICAL DATA:  Patient with elevated LFTs. Diarrhea. Abdominal pain. EXAM: US ABDOMEN LIMITED - RIGHT UPPER QUADRANT COMPARISON:  None. FINDINGS: Gallbladder: No cholelithiasis. No gallbladder wall thickening. Unable to assess for [facility] sign given pain medication in the ER. Minimal  pericholecystic fluid. Common bile duct: Diameter: 2.5 mm Liver: No focal lesion identified. Within normal limits in parenchymal echogenicity. IMPRESSION: No cholelithiasis or secondary signs to suggest acute cholecystitis. Electronically Signed   By: Annia Beltrew  Davis M.D.   On: 05/20/2016 17:58        Scheduled Meds: . ciprofloxacin  500 mg Oral BID  . elvitegravir-cobicistat-emtricitabine-tenofovir  1 tablet Oral Daily  . enoxaparin (LOVENOX) injection  40 mg Subcutaneous Q24H  . famotidine (PEPCID) IV  20 mg Intravenous Q12H  . feeding supplement  1 Container Oral BID BM  . insulin aspart protamine- aspart  30 Units Subcutaneous BID WC  . loperamide  2 mg Oral Once   Continuous Infusions: . sodium chloride 125 mL/hr at 05/22/16 0023     LOS: 2 days    Time spent: 35min    Zannie CovePreetha Docia Klar, MD [facility] Pager [phone]  If 7PM-7AM, please contact night-coverage [email or web address] Password [email or web address] 05/22/2016, 11:28 AM

## 2016-05-22 NOTE — Progress Notes (Signed)
At 0028, patient blood glucose 56. Patient alert and oriented x 3 and able to take oral intake. Patient given 240 mls of orange juice and blood glucose rechecked and 82 at 0052. Patient with no c/o pain. Will continue to monitor patient status and blood glucose levels.

## 2016-05-22 NOTE — Progress Notes (Signed)
Shigellosis: Per Edythe Lynnammy Robertson in ID, enteric not need, contact needed only if patient is incontinent.

## 2016-05-23 LAB — CBC
HEMATOCRIT: 36.8 % — AB (ref 39.0–52.0)
Hemoglobin: 13.1 g/dL (ref 13.0–17.0)
MCH: 30.9 pg (ref 26.0–34.0)
MCHC: 35.6 g/dL (ref 30.0–36.0)
MCV: 86.8 fL (ref 78.0–100.0)
Platelets: 191 10*3/uL (ref 150–400)
RBC: 4.24 MIL/uL (ref 4.22–5.81)
RDW: 12.7 % (ref 11.5–15.5)
WBC: 7.1 10*3/uL (ref 4.0–10.5)

## 2016-05-23 LAB — GLUCOSE, CAPILLARY
GLUCOSE-CAPILLARY: 200 mg/dL — AB (ref 65–99)
GLUCOSE-CAPILLARY: 212 mg/dL — AB (ref 65–99)

## 2016-05-23 LAB — BASIC METABOLIC PANEL
ANION GAP: 9 (ref 5–15)
BUN: 9 mg/dL (ref 6–20)
CHLORIDE: 107 mmol/L (ref 101–111)
CO2: 20 mmol/L — AB (ref 22–32)
CREATININE: 0.93 mg/dL (ref 0.61–1.24)
Calcium: 8.4 mg/dL — ABNORMAL LOW (ref 8.9–10.3)
GFR calc non Af Amer: >60 mL/min (ref >60)
GLUCOSE: 183 mg/dL — AB (ref 65–99)
Potassium: 3.7 mmol/L (ref 3.5–5.1)
Sodium: 136 mmol/L (ref 135–145)

## 2016-05-23 MED ORDER — INSULIN ASPART 100 UNIT/ML ~~LOC~~ SOLN
0.0000 [IU] | Freq: Three times a day (TID) | SUBCUTANEOUS | Status: DC
  Administered 2016-05-23: 2 [IU] via SUBCUTANEOUS
  Administered 2016-05-24: 1 [IU] via SUBCUTANEOUS

## 2016-05-23 MED ORDER — LOPERAMIDE HCL 2 MG PO CAPS
2.0000 mg | ORAL_CAPSULE | Freq: Once | ORAL | Status: AC
  Administered 2016-05-23: 2 mg via ORAL
  Filled 2016-05-23: qty 1

## 2016-05-23 NOTE — Progress Notes (Addendum)
Inpatient Diabetes Program Recommendations  AACE/ADA: New Consensus Statement on Inpatient Glycemic Control (2015)  Target Ranges:  Prepandial:   less than 140 mg/dL      Peak postprandial:   less than 180 mg/dL (1-2 hours)      Critically ill patients:  140 - 180 mg/dL   Lab Results  Component Value Date   GLUCAP 200 (H) 05/23/2016   HGBA1C 9.2 (H) 02/10/2015    Review of Glycemic Control  Diabetes history: DM1 Outpatient Diabetes medications: Novolog Mix 70/30 50 units in am and 35 units in pm Current orders for Inpatient glycemic control: Novolog 70/30 Mix 40 units bid  Inpatient Diabetes Program Recommendations:   Correction (SSI): Please consider sensitive correction scale of Novolog 0-9 units TID AC and 0-5 units QHS with CBG POCT.  Per ADA recommendations "consider performing an A1C on all patients with diabetes or hyperglycemia admitted to the hospital if not performed in the prior 3 months".  Text page sent to MD.  Thank you,  Kristine LineaKaren Arnaldo Heffron, RN, MSN Diabetes Coordinator Inpatient Diabetes Program [phone] (Team Pager)

## 2016-05-23 NOTE — Progress Notes (Signed)
PROGRESS NOTE    [name]  WUJ:[id number] DOB: 10-Sep-1985 DOA: 05/20/2016 PCP: No PCP Per Patient  Brief Narrative:Teri Petzold is a 31 y.o. male with medical history significant of DM 1 on Insulin, HIV on HAART -last CD4-530 and undetectable viral; load (11/17) presented to the ER today due to severe diarrhea ongoing for the last 3 days, which he describes as mostly watery at this point, without any blood or mucus associated with upper abdominal discomfort, subjective sensation of hot and cold, associated also with vomiting for the last 2-3 days   Assessment & Plan:   Severe gastroenteritis due to Shigella -Still has severe profuse diarrhea -Continue ciprofloxacin day 2,  GI pathogen panel overnight positive for Shigella -continue IVF, advance diet as tolerated  Dehydration with lactic acidosis -improving with IVF  Diabetes mellitus type 1 (HCC) - continue 70/30 at 30 units twice a day - stable  AKI -Due to dehydration, hydrate aggressively -improving  Human immunodeficiency virus (HIV) disease (HCC) -Last CD4 count of 530 and undetectable viral load -Continue HAART  Tobacco dependence  DVT prophylaxis: lovenox Code Status: Full Code Family Communication: None at bedside Disposition Plan: home tomorrow if better   Consultants:    Antimicrobials:   Ciprofloxacin   Subjective: Diarrhea starting to improve, still with cramps and nausea  Objective: Vitals:   05/22/16 0530 05/22/16 1329 05/22/16 2340 05/23/16 0616  BP: 112/75 124/73 (!) 101/59 112/74  Pulse: (!) 49 79 64 (!) 55  Resp: 18 19 16 18   Temp: 98 F (36.7 C) 99 F (37.2 C) 97.7 F (36.5 C) 97.7 F (36.5 C)  TempSrc: Oral Oral Oral Oral  SpO2: 100% 100% 100% 100%  Weight: 82.6 kg (182 lb)     Height: 6' (1.829 m)       Intake/Output Summary (Last 24 hours) at 05/23/16 1422 Last data filed at 05/23/16 9562  Gross per 24 hour  Intake             1480 ml  Output                0 ml    Net             1480 ml   Filed Weights   05/22/16 0530  Weight: 82.6 kg (182 lb)    Examination:  General exam: Appears calm, Appears more comfortable Respiratory system: Clear to auscultation. Respiratory effort normal. Cardiovascular system: S1 & S2 heard, RRR. No JVD, murmurs, rubs, gallops or clicks. No pedal edema. Gastrointestinal system: Abdomen is nondistended, soft and nontender. Normal bowel sounds heard. Central nervous system: Alert and oriented. No focal neurological deficits. Extremities: Symmetric 5 x 5 power. Skin: No rashes, lesions or ulcers Psychiatry: Judgement and insight appear normal. Mood & affect appropriate.     Data Reviewed:   CBC:  Recent Labs Lab 05/20/16 1240 05/21/16 0450 05/23/16 0527  WBC 2.4* 4.4 7.1  NEUTROABS 1.5*  --   --   HGB 18.2* 14.1 13.1  HCT 50.6 39.6 36.8*  MCV 88.8 89.0 86.8  PLT 176 158 191   Basic Metabolic Panel:  Recent Labs Lab 05/20/16 1240 05/21/16 0450 05/23/16 0527  NA 128* 131* 136  K 5.2* 3.9 3.7  CL 92* 101 107  CO2 20* 20* 20*  GLUCOSE 416* 146* 183*  BUN 15 13 9   CREATININE 1.49* 1.29* 0.93  CALCIUM 9.2 7.8* 8.4*   GFR: Estimated Creatinine Clearance: 128.6 mL/min (by C-G formula based on SCr of 0.93  mg/dL). Liver Function Tests:  Recent Labs Lab 05/20/16 1240 05/21/16 0450  AST 162* 65*  ALT 223* 110*  ALKPHOS 111 71  BILITOT 1.9* 1.1  PROT 8.0 5.8*  ALBUMIN 3.4* 2.2*    Recent Labs Lab 05/20/16 1247  LIPASE <10*   No results for input(s): AMMONIA in the last 168 hours. Coagulation Profile:  Recent Labs Lab 05/20/16 2031  INR 1.50   Cardiac Enzymes: No results for input(s): CKTOTAL, CKMB, CKMBINDEX, TROPONINI in the last 168 hours. BNP (last 3 results) No results for input(s): PROBNP in the last 8760 hours. HbA1C: No results for input(s): HGBA1C in the last 72 hours. CBG:  Recent Labs Lab 05/22/16 0052 05/22/16 0745 05/22/16 1606 05/22/16 2337 05/23/16 0829   GLUCAP 82 119* 326* 232* 200*   Lipid Profile: No results for input(s): CHOL, HDL, LDLCALC, TRIG, CHOLHDL, LDLDIRECT in the last 72 hours. Thyroid Function Tests: No results for input(s): TSH, T4TOTAL, FREET4, T3FREE, THYROIDAB in the last 72 hours. Anemia Panel: No results for input(s): VITAMINB12, FOLATE, FERRITIN, TIBC, IRON, RETICCTPCT in the last 72 hours. Urine analysis:    Component Value Date/Time   COLORURINE YELLOW 05/20/2016 1608   APPEARANCEUR CLEAR 05/20/2016 1608   LABSPEC 1.032 (H) 05/20/2016 1608   PHURINE 5.0 05/20/2016 1608   GLUCOSEU >=500 (A) 05/20/2016 1608   HGBUR SMALL (A) 05/20/2016 1608   BILIRUBINUR NEGATIVE 05/20/2016 1608   KETONESUR NEGATIVE 05/20/2016 1608   PROTEINUR 100 (A) 05/20/2016 1608   UROBILINOGEN 0.2 02/10/2015 1416   NITRITE NEGATIVE 05/20/2016 1608   LEUKOCYTESUR NEGATIVE 05/20/2016 1608   Sepsis Labs: @LABRCNTIP (procalcitonin:4,lacticidven:4)  ) Recent Results (from the past 240 hour(s))  C difficile quick scan w PCR reflex     Status: None   Collection Time: 05/20/16  3:43 PM  Result Value Ref Range Status   C Diff antigen NEGATIVE NEGATIVE Final   C Diff toxin NEGATIVE NEGATIVE Final   C Diff interpretation No C. difficile detected.  Final  Gastrointestinal Panel by PCR , Stool     Status: Abnormal   Collection Time: 05/20/16  3:43 PM  Result Value Ref Range Status   Campylobacter species NOT DETECTED NOT DETECTED Final   Plesimonas shigelloides NOT DETECTED NOT DETECTED Final   Salmonella species NOT DETECTED NOT DETECTED Final   Yersinia enterocolitica NOT DETECTED NOT DETECTED Final   Vibrio species NOT DETECTED NOT DETECTED Final   Vibrio cholerae NOT DETECTED NOT DETECTED Final   Enteroaggregative E coli (EAEC) NOT DETECTED NOT DETECTED Final   Enteropathogenic E coli (EPEC) NOT DETECTED NOT DETECTED Final   Enterotoxigenic E coli (ETEC) NOT DETECTED NOT DETECTED Final   Shiga like toxin producing E coli (STEC) NOT  DETECTED NOT DETECTED Final   Shigella/Enteroinvasive E coli (EIEC) DETECTED (A) NOT DETECTED Final    Comment: RESULT CALLED TO, READ BACK BY AND VERIFIED WITH: JAIME BLUE ON 05/21/16 AT 1504 SRC    Cryptosporidium NOT DETECTED NOT DETECTED Final   Cyclospora cayetanensis NOT DETECTED NOT DETECTED Final   Entamoeba histolytica NOT DETECTED NOT DETECTED Final   Giardia lamblia NOT DETECTED NOT DETECTED Final   Adenovirus F40/41 NOT DETECTED NOT DETECTED Final   Astrovirus NOT DETECTED NOT DETECTED Final   Norovirus GI/GII NOT DETECTED NOT DETECTED Final   Rotavirus A NOT DETECTED NOT DETECTED Final   Sapovirus (I, II, IV, and V) NOT DETECTED NOT DETECTED Final  Culture, blood (x 2)     Status: None (Preliminary result)  Collection Time: 05/20/16  8:20 PM  Result Value Ref Range Status   Specimen Description BLOOD LEFT ANTECUBITAL  Final   Special Requests IN PEDIATRIC BOTTLE 3CC  Final   Culture NO GROWTH 2 DAYS  Final   Report Status PENDING  Incomplete  Culture, blood (x 2)     Status: None (Preliminary result)   Collection Time: 05/20/16  8:32 PM  Result Value Ref Range Status   Specimen Description BLOOD LEFT HAND  Final   Special Requests IN PEDIATRIC BOTTLE 2CC  Final   Culture NO GROWTH 2 DAYS  Final   Report Status PENDING  Incomplete         Radiology Studies: No results found.      Scheduled Meds: . ciprofloxacin  500 mg Oral BID  . elvitegravir-cobicistat-emtricitabine-tenofovir  1 tablet Oral Daily  . enoxaparin (LOVENOX) injection  40 mg Subcutaneous Q24H  . famotidine  20 mg Oral BID  . feeding supplement  1 Container Oral BID BM  . insulin aspart protamine- aspart  40 Units Subcutaneous BID WC   Continuous Infusions: . sodium chloride 75 mL/hr at 05/23/16 0226     LOS: 3 days    Time spent: 35min    Zannie CovePreetha Allanna Bresee, MD [facility] Pager [phone]  If 7PM-7AM, please contact night-coverage [email or web address] Password [email or web address] 05/23/2016,  2:22 PM

## 2016-05-24 DIAGNOSIS — A039 Shigellosis, unspecified: Secondary | ICD-10-CM | POA: Diagnosis present

## 2016-05-24 LAB — GLUCOSE, CAPILLARY: GLUCOSE-CAPILLARY: 139 mg/dL — AB (ref 65–99)

## 2016-05-24 MED ORDER — CIPROFLOXACIN HCL 500 MG PO TABS: 500.0000 mg | ORAL_TABLET | Freq: Two times a day (BID) | ORAL | 0 refills | Status: DC

## 2016-05-24 MED ORDER — INSULIN ASPART PROT & ASPART (70-30 MIX) 100 UNIT/ML PEN: 30.0000 [IU] | PEN_INJECTOR | Freq: Two times a day (BID) | SUBCUTANEOUS | Status: DC

## 2016-05-24 NOTE — Progress Notes (Signed)
Pt CBG  Monitoring has been change to 4 times daily it was checked at 2125 will continue to monitor pt

## 2016-05-24 NOTE — Progress Notes (Signed)
[name] to be D/C'd Home per MD order.  Discussed with the patient and all questions fully answered.  VSS, Skin clean, dry and intact without evidence of skin break down, no evidence of skin tears noted. IV catheter discontinued intact. Site without signs and symptoms of complications. Dressing and pressure applied.  An After Visit Summary was printed and given to the patient. Patient received prescription.  D/c education completed with patient/family including follow up instructions, medication list, d/c activities limitations if indicated, with other d/c instructions as indicated by MD - patient able to verbalize understanding, all questions fully answered.   Patient instructed to return to ED, call 911, or call MD for any changes in condition.   Patient escorted via WC, and D/C home via private auto.  Melvenia Needlesreti O Jasimine Simms 05/24/2016 10:41 AM

## 2016-05-24 NOTE — Discharge Summary (Signed)
Physician Discharge Summary  Juan BaumgartenRashaun Adams WUJ:[id number] DOB: 09/15/1985 DOA: 05/20/2016  PCP: No PCP Per Patient  Admit date: 05/20/2016 Discharge date: 05/24/2016  Time spent: 35 minutes  Recommendations for Outpatient Follow-up:  1. PCP in 1 week 2. ID Dr.Comer in 2-3weeks   Discharge Diagnoses:    Shigella gastroenteritis   Diabetes mellitus type 1 (HCC)   Asthma   Human immunodeficiency virus (HIV) disease (HCC)   Tobacco dependence   Diarrhea   AKI (acute kidney injury) (HCC)   Dehydration   Discharge Condition:improved  Diet recommendation: soft diet advance as tolerated  Filed Weights   05/22/16 0530  Weight: 82.6 kg (182 lb)    History of present illness:  [name] a 30 y.o.malewith medical history significant of DM 1 on Insulin, HIV on HAART -last CD4-530 and undetectable viral; load (11/17) presented tothe ER today due to severe diarrhea ongoing for the last 3 days, which he describes as mostly watery at this point, without any blood or mucus associated with upper abdominal discomfort, subjective sensation of hot and cold, associated also with vomiting for the last 2-3 days  Hospital Course:  Severe gastroenteritis due to Shigella -improved with supportive care, bowel rest, IVF, anti-emetics -also treated with ciprofloxacin -GI pathogen panel  positive for Shigella -much improved, diarrhea improving and tolerating diet now, discharged home on oral ciprofloxacin to complete course  Dehydration with lactic acidosis -improved with IVF  Diabetes mellitus type 1 (HCC) - continue 70/30 , home dose cut down based on blood sugars this admission  AKI -Due to dehydration, hydrated aggressively -resolved  Human immunodeficiency virus (HIV) disease (HCC) -Last CD4 count of 530 and undetectable viral load -Continue HAART  Tobacco dependence -counseled  Discharge Exam: Vitals:   05/23/16 2254 05/24/16 0605  BP: (!) 149/90 124/61  Pulse: 73  (!) 58  Resp: 20 20  Temp: 98.9 F (37.2 C) 98.6 F (37 C)    General: AAOx3 Cardiovascular: S1S2/RRR Respiratory: CTAB  Discharge Instructions   Discharge Instructions    Diet Carb Modified    Complete by:  As directed    Increase activity slowly    Complete by:  As directed      Discharge Medication List as of 05/24/2016 10:41 AM    START taking these medications   Details  ciprofloxacin (CIPRO) 500 MG tablet Take 1 tablet (500 mg total) by mouth 2 (two) times daily. For 5days, Starting Tue 05/24/2016, Print      CONTINUE these medications which have CHANGED   Details  insulin aspart protamine - aspart (NOVOLOG MIX 70/30 FLEXPEN) (70-30) 100 UNIT/ML FlexPen Inject 0.3-0.4 mLs (30-40 Units total) into the skin 2 (two) times daily with a meal. Inject 40 units in the morning and 30 units in the evening, Starting Tue 05/24/2016, No Print      CONTINUE these medications which have NOT CHANGED   Details  acetaminophen (TYLENOL) 500 MG tablet Take 1 tablet (500 mg total) by mouth every 6 (six) hours as needed., Starting Tue 02/10/2015, Normal    Blood Glucose Monitoring Suppl (TRUE METRIX AIR GLUCOSE METER) DEVI 1 each by Does not apply route 4 (four) times daily -  with meals and at bedtime., Starting 11/12/2014, Until Discontinued, Normal    GENVOYA 150-150-200-10 MG TABS tablet TAKE 1 TABLET BY MOUTH DAILY, Normal    glucose blood (TRUE METRIX BLOOD GLUCOSE TEST) test strip Use as instructed, Normal    Insulin Syringe-Needle U-100 (INSULIN SYRINGE 1CC/31GX5/16") 31G X 5/16"  1 ML MISC Use 3 times per day to inject insulin subcutaneous.  Dx code: 250.01, Print    TRUEPLUS LANCETS 28G MISC USE AS DIRECTED, Normal    citalopram (CELEXA) 20 MG tablet Take 1 tablet (20 mg total) by mouth daily., Starting Thu 03/03/2016, Normal       Allergies  Allergen Reactions  . Shellfish Allergy Anaphylaxis and Hives      The results of significant diagnostics from this hospitalization  (including imaging, microbiology, ancillary and laboratory) are listed below for reference.    Significant Diagnostic Studies: Dg Chest Port 1 View  Result Date: 05/20/2016 CLINICAL DATA:  Code sepsis EXAM: PORTABLE CHEST 1 VIEW COMPARISON:  11/26/2014 FINDINGS: Non inclusion of the CP angles. No acute infiltrate or effusion. Stable cardiomediastinal silhouette. No pneumothorax. IMPRESSION: No active disease. Electronically Signed   By: Jasmine Pang M.D.   On: 05/20/2016 21:18   US Abdomen Limited Ruq  Result Date: 05/20/2016 CLINICAL DATA:  Patient with elevated LFTs. Diarrhea. Abdominal pain. EXAM: US ABDOMEN LIMITED - RIGHT UPPER QUADRANT COMPARISON:  None. FINDINGS: Gallbladder: No cholelithiasis. No gallbladder wall thickening. Unable to assess for [facility] sign given pain medication in the ER. Minimal pericholecystic fluid. Common bile duct: Diameter: 2.5 mm Liver: No focal lesion identified. Within normal limits in parenchymal echogenicity. IMPRESSION: No cholelithiasis or secondary signs to suggest acute cholecystitis. Electronically Signed   By: Annia Belt M.D.   On: 05/20/2016 17:58    Microbiology: Recent Results (from the past 240 hour(s))  C difficile quick scan w PCR reflex     Status: None   Collection Time: 05/20/16  3:43 PM  Result Value Ref Range Status   C Diff antigen NEGATIVE NEGATIVE Final   C Diff toxin NEGATIVE NEGATIVE Final   C Diff interpretation No C. difficile detected.  Final  Gastrointestinal Panel by PCR , Stool     Status: Abnormal   Collection Time: 05/20/16  3:43 PM  Result Value Ref Range Status   Campylobacter species NOT DETECTED NOT DETECTED Final   Plesimonas shigelloides NOT DETECTED NOT DETECTED Final   Salmonella species NOT DETECTED NOT DETECTED Final   Yersinia enterocolitica NOT DETECTED NOT DETECTED Final   Vibrio species NOT DETECTED NOT DETECTED Final   Vibrio cholerae NOT DETECTED NOT DETECTED Final   Enteroaggregative E coli (EAEC) NOT  DETECTED NOT DETECTED Final   Enteropathogenic E coli (EPEC) NOT DETECTED NOT DETECTED Final   Enterotoxigenic E coli (ETEC) NOT DETECTED NOT DETECTED Final   Shiga like toxin producing E coli (STEC) NOT DETECTED NOT DETECTED Final   Shigella/Enteroinvasive E coli (EIEC) DETECTED (A) NOT DETECTED Final    Comment: RESULT CALLED TO, READ BACK BY AND VERIFIED WITH: JAIME BLUE ON 05/21/16 AT 1504 SRC    Cryptosporidium NOT DETECTED NOT DETECTED Final   Cyclospora cayetanensis NOT DETECTED NOT DETECTED Final   Entamoeba histolytica NOT DETECTED NOT DETECTED Final   Giardia lamblia NOT DETECTED NOT DETECTED Final   Adenovirus F40/41 NOT DETECTED NOT DETECTED Final   Astrovirus NOT DETECTED NOT DETECTED Final   Norovirus GI/GII NOT DETECTED NOT DETECTED Final   Rotavirus A NOT DETECTED NOT DETECTED Final   Sapovirus (I, II, IV, and V) NOT DETECTED NOT DETECTED Final  Culture, blood (x 2)     Status: None (Preliminary result)   Collection Time: 05/20/16  8:20 PM  Result Value Ref Range Status   Specimen Description BLOOD LEFT ANTECUBITAL  Final   Special Requests IN  PEDIATRIC BOTTLE 3CC  Final   Culture NO GROWTH 3 DAYS  Final   Report Status PENDING  Incomplete  Culture, blood (x 2)     Status: None (Preliminary result)   Collection Time: 05/20/16  8:32 PM  Result Value Ref Range Status   Specimen Description BLOOD LEFT HAND  Final   Special Requests IN PEDIATRIC BOTTLE 2CC  Final   Culture NO GROWTH 3 DAYS  Final   Report Status PENDING  Incomplete     Labs: Basic Metabolic Panel:  Recent Labs Lab 05/20/16 1240 05/21/16 0450 05/23/16 0527  NA 128* 131* 136  K 5.2* 3.9 3.7  CL 92* 101 107  CO2 20* 20* 20*  GLUCOSE 416* 146* 183*  BUN 15 13 9   CREATININE 1.49* 1.29* 0.93  CALCIUM 9.2 7.8* 8.4*   Liver Function Tests:  Recent Labs Lab 05/20/16 1240 05/21/16 0450  AST 162* 65*  ALT 223* 110*  ALKPHOS 111 71  BILITOT 1.9* 1.1  PROT 8.0 5.8*  ALBUMIN 3.4* 2.2*     Recent Labs Lab 05/20/16 1247  LIPASE <10*   No results for input(s): AMMONIA in the last 168 hours. CBC:  Recent Labs Lab 05/20/16 1240 05/21/16 0450 05/23/16 0527  WBC 2.4* 4.4 7.1  NEUTROABS 1.5*  --   --   HGB 18.2* 14.1 13.1  HCT 50.6 39.6 36.8*  MCV 88.8 89.0 86.8  PLT 176 158 191   Cardiac Enzymes: No results for input(s): CKTOTAL, CKMB, CKMBINDEX, TROPONINI in the last 168 hours. BNP: BNP (last 3 results) No results for input(s): BNP in the last 8760 hours.  ProBNP (last 3 results) No results for input(s): PROBNP in the last 8760 hours.  CBG:  Recent Labs Lab 05/22/16 1606 05/22/16 2337 05/23/16 0829 05/23/16 2125 05/24/16 0752  GLUCAP 326* 232* 200* 212* 139*       SignedZannie Cove MD.  [facility] 05/24/2016, 2:56 PM

## 2016-05-25 LAB — CULTURE, BLOOD (ROUTINE X 2)
CULTURE: NO GROWTH
Culture: NO GROWTH

## 2016-07-04 ENCOUNTER — Other Ambulatory Visit: Payer: Self-pay | Admitting: *Deleted

## 2016-07-04 ENCOUNTER — Other Ambulatory Visit: Payer: Self-pay | Admitting: Pharmacist

## 2016-07-04 MED ORDER — INSULIN ASPART PROT & ASPART (70-30 MIX) 100 UNIT/ML ~~LOC~~ SUSP: 30.0000 [IU] | Freq: Two times a day (BID) | SUBCUTANEOUS | 11 refills | Status: DC

## 2016-07-04 MED ORDER — INSULIN ASPART 100 UNIT/ML ~~LOC~~ SOLN: 30.0000 [IU] | Freq: Two times a day (BID) | SUBCUTANEOUS | 11 refills | Status: DC

## 2016-07-04 MED FILL — $NOVOLOG MIX 70/30 VIAL: (70-30) 100 | 28 days supply | Qty: 20 | Fill #0

## 2016-07-04 NOTE — Telephone Encounter (Signed)
Filled for 30 day until appointment scheduled.

## 2016-07-05 ENCOUNTER — Ambulatory Visit: Payer: Self-pay | Admitting: Internal Medicine

## 2016-07-12 ENCOUNTER — Ambulatory Visit: Payer: No Typology Code available for payment source | Admitting: Internal Medicine

## 2016-08-11 MED FILL — $NOVOLOG MIX 70/30 VIAL: (70-30) 100 | 28 days supply | Qty: 20 | Fill #1

## 2016-10-04 MED FILL — $NOVOLOG MIX 70/30 VIAL: (70-30) 100 | 28 days supply | Qty: 20 | Fill #2

## 2016-10-29 ENCOUNTER — Other Ambulatory Visit: Payer: Self-pay | Admitting: Internal Medicine

## 2016-11-29 MED FILL — $NOVOLOG MIX 70/30 VIAL: (70-30) 100 | 28 days supply | Qty: 20 | Fill #3

## 2016-12-02 ENCOUNTER — Other Ambulatory Visit: Payer: Self-pay | Admitting: Internal Medicine

## 2016-12-02 DIAGNOSIS — B20 Human immunodeficiency virus [HIV] disease: Secondary | ICD-10-CM

## 2016-12-14 ENCOUNTER — Ambulatory Visit: Payer: No Typology Code available for payment source

## 2016-12-14 ENCOUNTER — Ambulatory Visit (INDEPENDENT_AMBULATORY_CARE_PROVIDER_SITE_OTHER): Payer: No Typology Code available for payment source | Admitting: Internal Medicine

## 2016-12-14 ENCOUNTER — Other Ambulatory Visit: Payer: No Typology Code available for payment source

## 2016-12-14 VITALS — BP 120/84 | HR 72 | Temp 98.9°F | Wt 196.0 lb

## 2016-12-14 DIAGNOSIS — B20 Human immunodeficiency virus [HIV] disease: Secondary | ICD-10-CM

## 2016-12-14 DIAGNOSIS — Z113 Encounter for screening for infections with a predominantly sexual mode of transmission: Secondary | ICD-10-CM

## 2016-12-14 DIAGNOSIS — Z79899 Other long term (current) drug therapy: Secondary | ICD-10-CM

## 2016-12-14 DIAGNOSIS — Z7189 Other specified counseling: Secondary | ICD-10-CM

## 2016-12-14 DIAGNOSIS — Z7185 Encounter for immunization safety counseling: Secondary | ICD-10-CM | POA: Insufficient documentation

## 2016-12-14 DIAGNOSIS — Z7689 Persons encountering health services in other specified circumstances: Secondary | ICD-10-CM | POA: Insufficient documentation

## 2016-12-14 NOTE — Assessment & Plan Note (Signed)
Counseled on the flu shot and given today 

## 2016-12-14 NOTE — Assessment & Plan Note (Signed)
Note given for return to work

## 2016-12-14 NOTE — Progress Notes (Signed)
   Subjective:    Patient ID: Juan BaumgartenRashaun Adams, male    DOB: 06/24/1985, 31 y.o.   MRN: [id number]  HPI Here for follow up of HIV On Genvoya and denies any missed doses.  No associated n/v/d.  No rashes.  Had labs today. Needs doctor note to go back to work.  States he had a behavioral issues they felt was related to low blood sugar.  No weight loss.     Review of Systems  Constitutional: Negative for fatigue.  Gastrointestinal: Negative for diarrhea.  Skin: Negative for rash.       Objective:   Physical Exam  Constitutional: He appears well-developed and well-nourished. No distress.  Eyes: No scleral icterus.  Cardiovascular: Normal rate, regular rhythm and normal heart sounds.   No murmur heard. Pulmonary/Chest: Effort normal and breath sounds normal. No respiratory distress.  Lymphadenopathy:    He has no cervical adenopathy.  Skin: No rash noted.   SH: previous smoker       Assessment & Plan:

## 2016-12-14 NOTE — Assessment & Plan Note (Signed)
No issues.  Labs today and rtc 4 months

## 2016-12-15 ENCOUNTER — Encounter: Payer: Self-pay | Admitting: Internal Medicine

## 2016-12-15 LAB — COMPLETE METABOLIC PANEL WITH GFR
AG Ratio: 1.4 (calc) (ref 1.0–2.5)
ALBUMIN MSPROF: 4.2 g/dL (ref 3.6–5.1)
ALT: 23 U/L (ref 9–46)
AST: 27 U/L (ref 10–40)
Alkaline phosphatase (APISO): 43 U/L (ref 40–115)
BILIRUBIN TOTAL: 0.5 mg/dL (ref 0.2–1.2)
BUN: 13 mg/dL (ref 7–25)
CHLORIDE: 101 mmol/L (ref 98–110)
CO2: 28 mmol/L (ref 20–32)
Calcium: 9.7 mg/dL (ref 8.6–10.3)
Creat: 1.28 mg/dL (ref 0.60–1.35)
GFR, Est African American: 86 mL/min/{1.73_m2} (ref >=60)
GFR, Est Non African American: 75 mL/min/{1.73_m2} (ref >=60)
GLOBULIN: 2.9 g/dL (ref 1.9–3.7)
Glucose, Bld: 238 mg/dL — ABNORMAL HIGH (ref 65–99)
Potassium: 4.8 mmol/L (ref 3.5–5.3)
SODIUM: 138 mmol/L (ref 135–146)
Total Protein: 7.1 g/dL (ref 6.1–8.1)

## 2016-12-15 LAB — SPECIMEN COMPROMISED

## 2016-12-15 LAB — CBC WITH DIFFERENTIAL/PLATELET
Basophils Absolute: 21 cells/uL (ref 0–200)
Basophils Relative: 0.3 %
EOS PCT: 1.4 %
Eosinophils Absolute: 99 cells/uL (ref 15–500)
HEMATOCRIT: 45.4 % (ref 38.5–50.0)
Hemoglobin: 15.6 g/dL (ref 13.2–17.1)
LYMPHS ABS: 2584 {cells}/uL (ref 850–3900)
MCH: 31.2 pg (ref 27.0–33.0)
MCHC: 34.4 g/dL (ref 32.0–36.0)
MCV: 90.8 fL (ref 80.0–100.0)
MPV: 10 fL (ref 7.5–12.5)
Monocytes Relative: 6.6 %
NEUTROS PCT: 55.3 %
Neutro Abs: 3926 cells/uL (ref 1500–7800)
Platelets: 301 10*3/uL (ref 140–400)
RBC: 5 10*6/uL (ref 4.20–5.80)
RDW: 13 % (ref 11.0–15.0)
Total Lymphocyte: 36.4 %
WBC mixed population: 469 cells/uL (ref 200–950)
WBC: 7.1 10*3/uL (ref 3.8–10.8)

## 2016-12-15 LAB — LIPID PANEL
Cholesterol: 160 mg/dL (ref <200)
HDL: 80 mg/dL (ref >40)
LDL Cholesterol (Calc): 63 mg/dL (calc)
NON-HDL CHOLESTEROL (CALC): 80 mg/dL (ref <130)
Total CHOL/HDL Ratio: 2 (calc) (ref <5.0)
Triglycerides: 90 mg/dL (ref <150)

## 2016-12-15 LAB — T-HELPER CELL (CD4) - (RCID CLINIC ONLY)
CD4 % Helper T Cell: 30 % — ABNORMAL LOW (ref 33–55)
CD4 T CELL ABS: 910 /uL (ref 400–2700)

## 2016-12-15 LAB — RPR: RPR Ser Ql: NONREACTIVE

## 2016-12-16 LAB — HIV-1 RNA QUANT-NO REFLEX-BLD
HIV 1 RNA Quant: <20 copies/mL
HIV-1 RNA QUANT, LOG: NOT DETECTED {Log_copies}/mL

## 2016-12-19 ENCOUNTER — Telehealth: Payer: Self-pay | Admitting: Licensed Clinical Social Worker

## 2016-12-19 NOTE — Telephone Encounter (Signed)
[facility] and attempted to make appointment with patient.  Both numbers rang a few times, stopped and [facility] was not able to leave a message.  There was no voice mail.  Juan AlbertsSherry Zaiah Adams, [facility]

## 2016-12-23 ENCOUNTER — Telehealth: Payer: Self-pay | Admitting: Licensed Clinical Social Worker

## 2016-12-23 NOTE — Telephone Encounter (Signed)
[facility] both [facility] (does not work) and home numbers and could not leave message at any.  The phone rang and rang.  [name], [facility]

## 2016-12-28 ENCOUNTER — Telehealth: Payer: Self-pay | Admitting: Licensed Clinical Social Worker

## 2016-12-28 NOTE — Telephone Encounter (Signed)
[facility] called both mobile (does not work) and home numbers and could not leave message at any.  The phone rang and rang.  Masiel Gentzler, LPC 

## 2017-01-11 MED FILL — $NOVOLOG MIX 70/30 VIAL: (70-30) 100 | 28 days supply | Qty: 20 | Fill #4

## 2017-01-20 ENCOUNTER — Other Ambulatory Visit: Payer: Self-pay | Admitting: Internal Medicine

## 2017-01-20 DIAGNOSIS — B20 Human immunodeficiency virus [HIV] disease: Secondary | ICD-10-CM

## 2017-03-02 ENCOUNTER — Other Ambulatory Visit: Payer: Self-pay | Admitting: Internal Medicine

## 2017-03-06 ENCOUNTER — Other Ambulatory Visit: Payer: Self-pay | Admitting: *Deleted

## 2017-03-06 DIAGNOSIS — B20 Human immunodeficiency virus [HIV] disease: Secondary | ICD-10-CM

## 2017-03-06 MED ORDER — ELVITEG-COBIC-EMTRICIT-TENOFAF 150-150-200-10 MG PO TABS: 1.0000 | ORAL_TABLET | Freq: Every day | ORAL | 5 refills | Status: DC

## 2017-04-18 ENCOUNTER — Ambulatory Visit: Payer: No Typology Code available for payment source | Admitting: Internal Medicine

## 2017-06-12 ENCOUNTER — Other Ambulatory Visit: Payer: Self-pay | Admitting: *Deleted

## 2017-06-12 DIAGNOSIS — B20 Human immunodeficiency virus [HIV] disease: Secondary | ICD-10-CM

## 2017-06-12 MED ORDER — ELVITEG-COBIC-EMTRICIT-TENOFAF 150-150-200-10 MG PO TABS: 1.0000 | ORAL_TABLET | Freq: Every day | ORAL | 5 refills | Status: DC

## 2017-06-26 ENCOUNTER — Telehealth: Payer: Self-pay | Admitting: Behavioral Health

## 2017-06-26 NOTE — Telephone Encounter (Signed)
Patient left a voicemail for someone to call him back.  Called back and has no answer, left voicemail for him to return call. Angeline Slim RN

## 2017-06-27 ENCOUNTER — Ambulatory Visit (INDEPENDENT_AMBULATORY_CARE_PROVIDER_SITE_OTHER): Payer: Self-pay | Admitting: Internal Medicine

## 2017-06-27 ENCOUNTER — Encounter: Payer: Self-pay | Admitting: Internal Medicine

## 2017-06-27 VITALS — BP 122/82 | HR 73 | Temp 98.9°F | Ht 72.0 in | Wt 193.8 lb

## 2017-06-27 DIAGNOSIS — B20 Human immunodeficiency virus [HIV] disease: Secondary | ICD-10-CM

## 2017-06-27 DIAGNOSIS — Z7189 Other specified counseling: Secondary | ICD-10-CM

## 2017-06-27 DIAGNOSIS — Z7185 Encounter for immunization safety counseling: Secondary | ICD-10-CM

## 2017-06-27 NOTE — Progress Notes (Signed)
   Subjective:    Patient ID: [name], male    DOB: Jul 08, 1985, 32 y.o.   MRN: [id number]  HPI Here for follow up of HIV On Genvoya and denies any missed doses.  No associated n/v/d.  No rashes.  No recent labs.   No weight loss.     Review of Systems  Constitutional: Negative for fatigue.  Gastrointestinal: Negative for diarrhea.  Skin: Negative for rash.       Objective:   Physical Exam  Constitutional: He appears well-developed and well-nourished. No distress.  Eyes: No scleral icterus.  Cardiovascular: Normal rate, regular rhythm and normal heart sounds.  No murmur heard. Pulmonary/Chest: Effort normal and breath sounds normal. No respiratory distress.  Lymphadenopathy:    He has no cervical adenopathy.  Skin: No rash noted.   SH: previous smoker       Assessment & Plan:

## 2017-06-27 NOTE — Assessment & Plan Note (Signed)
Doing well.  Labs today and rtc 6 months unless concerns.  

## 2017-06-27 NOTE — Assessment & Plan Note (Signed)
Will do Prevnar and Menveo next visit.

## 2017-06-28 LAB — T-HELPER CELL (CD4) - (RCID CLINIC ONLY)
CD4 T CELL ABS: 560 /uL (ref 400–2700)
CD4 T CELL HELPER: 34 % (ref 33–55)

## 2017-06-29 LAB — HIV-1 RNA QUANT-NO REFLEX-BLD
HIV 1 RNA Quant: <20 copies/mL
HIV-1 RNA Quant, Log: <1.3 Log copies/mL

## 2017-08-28 ENCOUNTER — Ambulatory Visit: Payer: Self-pay

## 2017-10-01 ENCOUNTER — Other Ambulatory Visit: Payer: Self-pay | Admitting: Internal Medicine

## 2017-10-31 ENCOUNTER — Encounter (HOSPITAL_COMMUNITY): Payer: Self-pay | Admitting: *Deleted

## 2017-10-31 ENCOUNTER — Inpatient Hospital Stay (HOSPITAL_COMMUNITY)
Admission: EM | Admit: 2017-10-31 | Discharge: 2017-11-03 | DRG: 493 | Disposition: A | Discharge status: Home / Self Care | Payer: Self-pay | Attending: Internal Medicine | Admitting: Internal Medicine

## 2017-10-31 ENCOUNTER — Emergency Department (HOSPITAL_COMMUNITY): Payer: Self-pay

## 2017-10-31 ENCOUNTER — Other Ambulatory Visit: Payer: Self-pay

## 2017-10-31 DIAGNOSIS — Z419 Encounter for procedure for purposes other than remedying health state, unspecified: Secondary | ICD-10-CM

## 2017-10-31 DIAGNOSIS — Z23 Encounter for immunization: Secondary | ICD-10-CM

## 2017-10-31 DIAGNOSIS — S92351A Displaced fracture of fifth metatarsal bone, right foot, initial encounter for closed fracture: Secondary | ICD-10-CM | POA: Diagnosis present

## 2017-10-31 DIAGNOSIS — Z833 Family history of diabetes mellitus: Secondary | ICD-10-CM

## 2017-10-31 DIAGNOSIS — E109 Type 1 diabetes mellitus without complications: Secondary | ICD-10-CM | POA: Diagnosis present

## 2017-10-31 DIAGNOSIS — T148XXA Other injury of unspecified body region, initial encounter: Secondary | ICD-10-CM

## 2017-10-31 DIAGNOSIS — S82401A Unspecified fracture of shaft of right fibula, initial encounter for closed fracture: Secondary | ICD-10-CM

## 2017-10-31 DIAGNOSIS — S92353A Displaced fracture of fifth metatarsal bone, unspecified foot, initial encounter for closed fracture: Secondary | ICD-10-CM

## 2017-10-31 DIAGNOSIS — S82251A Displaced comminuted fracture of shaft of right tibia, initial encounter for closed fracture: Principal | ICD-10-CM | POA: Diagnosis present

## 2017-10-31 DIAGNOSIS — Z91013 Allergy to seafood: Secondary | ICD-10-CM

## 2017-10-31 DIAGNOSIS — R55 Syncope and collapse: Secondary | ICD-10-CM | POA: Diagnosis present

## 2017-10-31 DIAGNOSIS — Z794 Long term (current) use of insulin: Secondary | ICD-10-CM

## 2017-10-31 DIAGNOSIS — F418 Other specified anxiety disorders: Secondary | ICD-10-CM | POA: Diagnosis present

## 2017-10-31 DIAGNOSIS — J45909 Unspecified asthma, uncomplicated: Secondary | ICD-10-CM | POA: Diagnosis present

## 2017-10-31 DIAGNOSIS — S82201A Unspecified fracture of shaft of right tibia, initial encounter for closed fracture: Secondary | ICD-10-CM

## 2017-10-31 DIAGNOSIS — Z01818 Encounter for other preprocedural examination: Secondary | ICD-10-CM

## 2017-10-31 DIAGNOSIS — E1021 Type 1 diabetes mellitus with diabetic nephropathy: Secondary | ICD-10-CM

## 2017-10-31 DIAGNOSIS — Z21 Asymptomatic human immunodeficiency virus [HIV] infection status: Secondary | ICD-10-CM | POA: Diagnosis present

## 2017-10-31 DIAGNOSIS — S8251XA Displaced fracture of medial malleolus of right tibia, initial encounter for closed fracture: Secondary | ICD-10-CM

## 2017-10-31 DIAGNOSIS — Z79899 Other long term (current) drug therapy: Secondary | ICD-10-CM

## 2017-10-31 DIAGNOSIS — F329 Major depressive disorder, single episode, unspecified: Secondary | ICD-10-CM | POA: Diagnosis present

## 2017-10-31 DIAGNOSIS — E1042 Type 1 diabetes mellitus with diabetic polyneuropathy: Secondary | ICD-10-CM | POA: Diagnosis present

## 2017-10-31 DIAGNOSIS — S82451A Displaced comminuted fracture of shaft of right fibula, initial encounter for closed fracture: Secondary | ICD-10-CM | POA: Diagnosis present

## 2017-10-31 DIAGNOSIS — B20 Human immunodeficiency virus [HIV] disease: Secondary | ICD-10-CM | POA: Diagnosis present

## 2017-10-31 DIAGNOSIS — W19XXXA Unspecified fall, initial encounter: Secondary | ICD-10-CM

## 2017-10-31 DIAGNOSIS — Z87891 Personal history of nicotine dependence: Secondary | ICD-10-CM

## 2017-10-31 DIAGNOSIS — W1842XA Slipping, tripping and stumbling without falling due to stepping into hole or opening, initial encounter: Secondary | ICD-10-CM | POA: Diagnosis present

## 2017-10-31 DIAGNOSIS — F32A Depression, unspecified: Secondary | ICD-10-CM | POA: Diagnosis present

## 2017-10-31 HISTORY — DX: Unspecified convulsions: R56.9

## 2017-10-31 HISTORY — DX: Displaced fracture of medial malleolus of right tibia, initial encounter for closed fracture: S82.51XA

## 2017-10-31 LAB — CBC WITH DIFFERENTIAL/PLATELET
ABS IMMATURE GRANULOCYTES: 0 10*3/uL (ref 0.0–0.1)
BASOS PCT: 1 %
Basophils Absolute: 0 10*3/uL (ref 0.0–0.1)
Eosinophils Absolute: 0.3 10*3/uL (ref 0.0–0.7)
Eosinophils Relative: 3 %
HCT: 43.4 % (ref 39.0–52.0)
HEMOGLOBIN: 14.7 g/dL (ref 13.0–17.0)
IMMATURE GRANULOCYTES: 0 %
LYMPHS PCT: 43 %
Lymphs Abs: 3.7 10*3/uL (ref 0.7–4.0)
MCH: 31.7 pg (ref 26.0–34.0)
MCHC: 33.9 g/dL (ref 30.0–36.0)
MCV: 93.7 fL (ref 78.0–100.0)
MONO ABS: 0.6 10*3/uL (ref 0.1–1.0)
Monocytes Relative: 7 %
NEUTROS ABS: 3.8 10*3/uL (ref 1.7–7.7)
NEUTROS PCT: 46 %
PLATELETS: 254 10*3/uL (ref 150–400)
RBC: 4.63 MIL/uL (ref 4.22–5.81)
RDW: 13.3 % (ref 11.5–15.5)
WBC: 8.4 10*3/uL (ref 4.0–10.5)

## 2017-10-31 LAB — BASIC METABOLIC PANEL
ANION GAP: 11 (ref 5–15)
BUN: 11 mg/dL (ref 6–20)
CO2: 24 mmol/L (ref 22–32)
Calcium: 9.1 mg/dL (ref 8.9–10.3)
Chloride: 105 mmol/L (ref 98–111)
Creatinine, Ser: 1.34 mg/dL — ABNORMAL HIGH (ref 0.61–1.24)
Glucose, Bld: 208 mg/dL — ABNORMAL HIGH (ref 70–99)
POTASSIUM: 4.1 mmol/L (ref 3.5–5.1)
Sodium: 140 mmol/L (ref 135–145)

## 2017-10-31 LAB — CBG MONITORING, ED: GLUCOSE-CAPILLARY: 186 mg/dL — AB (ref 70–99)

## 2017-10-31 MED ORDER — HYDROMORPHONE HCL 1 MG/ML IJ SOLN
1.0000 mg | INTRAMUSCULAR | Status: DC | PRN
  Administered 2017-10-31 – 2017-11-01 (×3): 1 mg via INTRAVENOUS
  Filled 2017-10-31 (×3): qty 1

## 2017-10-31 MED ORDER — SODIUM CHLORIDE 0.9 % IV BOLUS
1000.0000 mL | Freq: Once | INTRAVENOUS | Status: AC
  Administered 2017-10-31: 1000 mL via INTRAVENOUS

## 2017-10-31 MED ORDER — ONDANSETRON HCL 4 MG/2ML IJ SOLN
4.0000 mg | Freq: Once | INTRAMUSCULAR | Status: AC
  Administered 2017-10-31: 4 mg via INTRAVENOUS
  Filled 2017-10-31: qty 2

## 2017-10-31 NOTE — ED Provider Notes (Signed)
[facility] EMERGENCY DEPARTMENT Provider Note   CSN: [id number] Arrival date & time: 10/31/17  2002     History   Chief Complaint Chief Complaint  Patient presents with  . Ankle Injury    HPI [name] is a 32 y.o. male.  HPI  Patient presents via EMS due to pain in his right ankle. Patient [place] train tracks, stepped in hole, felt sudden onset of sharp pain in his right ankle. Since that time the pain is been substantial, transiently improved with fentanyl. EMS notes the patient has had episodes of syncope after there initial assessment, after the patient complained of increasing pain. Patient self is minimally verbal, writhing, denies other complaints, states the pain in his foot and ankle is unbearable. Pain is severe.   Past Medical History:  Diagnosis Date  . Anxiety   . Asthma    no prior hospitalizations, intubations  . Depression   . Headache    "2-3/day" (05/20/2016)  . HIV (human immunodeficiency virus infection) (HCC)   . Migraine    "a few/month" (05/20/2016)  . Refusal of blood transfusions as patient is Jehovah's Witness   . Type I diabetes mellitus [facility])     Patient Active Problem List   Diagnosis Date Noted  . Return to work evaluation 12/14/2016  . Vaccine counseling 12/14/2016  . Screening examination for venereal disease 06/09/2015  . Encounter for long-term (current) use of medications 06/09/2015  . Depression 03/04/2015  . Dizziness and giddiness 03/04/2015  . Tobacco dependence 11/12/2014  . Human immunodeficiency virus (HIV) disease (HCC) 11/11/2014  . Stress 12/19/2012  . Headache(784.0) 01/04/2012  . Asthma 10/02/2010  . Diabetes mellitus type 1 (HCC) 03/01/1991    Past Surgical History:  Procedure Laterality Date  . NO PAST SURGERIES          Home Medications    Prior to Admission medications   Medication Sig Start Date End Date Taking? Authorizing Provider  acetaminophen (TYLENOL) 500 MG  tablet Take 1 tablet (500 mg total) by mouth every 6 (six) hours as needed. 02/10/15   Massie Maroon, FNP  Blood Glucose Monitoring Suppl (TRUE METRIX AIR GLUCOSE METER) DEVI 1 each by Does not apply route 4 (four) times daily -  with meals and at bedtime. 11/12/14   Massie Maroon, FNP  citalopram (CELEXA) 20 MG tablet TAKE 1 TABLET BY MOUTH DAILY 10/02/17   Comer, Belia Heman, MD  elvitegravir-cobicistat-emtricitabine-tenofovir (GENVOYA) 150-150-200-10 MG TABS tablet Take 1 tablet by mouth daily. 06/12/17   Comer, Belia Heman, MD  glucose blood (TRUE METRIX BLOOD GLUCOSE TEST) test strip Use as instructed 11/12/14   Quentin Angst, MD  insulin aspart protamine- aspart (NOVOLOG MIX 70/30) (70-30) 100 UNIT/ML injection Inject 0.3-0.4 mLs (30-40 Units total) into the skin 2 (two) times daily with a meal. 40 units in the evening 07/04/16   Quentin Angst, MD  Insulin Syringe-Needle U-100 (INSULIN SYRINGE 1CC/31GX5/16") 31G X 5/16" 1 ML MISC Use 3 times per day to inject insulin subcutaneous.  Dx code: 250.01 12/19/12   Linward Headland, MD  TRUEPLUS LANCETS 28G MISC USE AS DIRECTED 11/12/14   Quentin Angst, MD    Family History Family History  Problem Relation Age of Onset  . Thyroid disease Mother   . Diabetes Mother   . Breast cancer Maternal Grandmother   . Diabetes Maternal Grandmother     Social History Social History   Tobacco Use  . Smoking  status: Former Smoker    Types: Cigarettes    Start date: 09/29/2015  . Smokeless tobacco: Never Used  . Tobacco comment: 05/20/2016 "some day smoker; quit ~ 1 month ago"  Substance Use Topics  . Alcohol use: Yes    Alcohol/week: 3.0 - 4.0 standard drinks    Types: 1 Cans of beer, 2 Shots of liquor per week  . Drug use: Yes    Frequency: 2.0 times per week    Types: Marijuana    Comment: 05/20/2016 "quit ~ 1 month ago"     Allergies   Shellfish allergy   Review of Systems Review of Systems  Constitutional:       Per HPI,  otherwise negative  HENT:       Per HPI, otherwise negative  Respiratory:       Per HPI, otherwise negative  Cardiovascular:       Per HPI, otherwise negative  Gastrointestinal: Positive for nausea. Negative for vomiting.  Endocrine:       Negative aside from HPI  Genitourinary:       Neg aside from HPI   Musculoskeletal:       Per HPI, otherwise negative  Skin: Negative.   Allergic/Immunologic: Positive for immunocompromised state.  Neurological: Positive for syncope and numbness.     Physical Exam Updated Vital Signs BP (!) 144/87 (BP Location: Right Arm)   Pulse 82   Temp 98.2 F (36.8 C) (Oral)   Resp 12   SpO2 100%   Physical Exam  Constitutional: He is oriented to person, place, and time. He appears well-developed. He appears distressed.  Very uncomfortable appearing thin young male writhing in the gurney  HENT:  Head: Normocephalic and atraumatic.  Eyes: Conjunctivae and EOM are normal.  Cardiovascular: Normal rate and regular rhythm.  Pulmonary/Chest: Effort normal. No stridor. No respiratory distress.  Abdominal: He exhibits no distension.  Musculoskeletal: He exhibits no edema.       Legs: Neurological: He is alert and oriented to person, place, and time.  Skin: Skin is warm and dry.  Psychiatric: He has a normal mood and affect.  Nursing note and vitals reviewed.    ED Treatments / Results  Labs (all labs ordered are listed, but only abnormal results are displayed) Labs Reviewed  BASIC METABOLIC PANEL - Abnormal; Notable for the following components:      Result Value   Glucose, Bld 208 (*)    Creatinine, Ser 1.34 (*)    All other components within normal limits  CBG MONITORING, ED - Abnormal; Notable for the following components:   Glucose-Capillary 186 (*)    All other components within normal limits  CBC WITH DIFFERENTIAL/PLATELET   EMS EKG reviewed, rate 68, wander, Minor artifact, nonspecific T wave changes, borderline EKG  Radiology Dg  Tibia/fibula Right  Result Date: 10/31/2017 CLINICAL DATA:  Right lower leg pain after stepping in a hole. EXAM: RIGHT TIBIA AND FIBULA - 2 VIEW; RIGHT ANKLE - COMPLETE 3+ VIEW COMPARISON:  None. FINDINGS: Comminuted fracture of the mid to distal tibia diaphysis with 11 mm medial displacement and 16 mm posterior displacement. There is a nondisplaced spiral component which extends into the medial malleolus and tibial plafond. Comminuted fracture of the mid to distal fibular diaphysis with 12 mm medial displacement, 11 mm posterior displacement, and 1.8 cm of overriding fragments. Nondisplaced fracture through the proximal fifth metatarsal. The ankle mortise is symmetric. The talar dome is intact. Small tibiotalar joint effusion. Joint spaces are preserved.  Bone mineralization is normal. Diffuse soft tissue swelling of the lower leg. IMPRESSION: 1. Comminuted, displaced fractures of the mid to distal tibial and fibular diaphyses as described above. The tibial fracture extends into the medial malleolus and tibial plafond. 2. Nondisplaced fracture of the proximal fifth metatarsal. Electronically Signed   By: Obie Dredge M.D.   On: 10/31/2017 20:58   Dg Ankle Complete Right  Result Date: 10/31/2017 CLINICAL DATA:  Right lower leg pain after stepping in a hole. EXAM: RIGHT TIBIA AND FIBULA - 2 VIEW; RIGHT ANKLE - COMPLETE 3+ VIEW COMPARISON:  None. FINDINGS: Comminuted fracture of the mid to distal tibia diaphysis with 11 mm medial displacement and 16 mm posterior displacement. There is a nondisplaced spiral component which extends into the medial malleolus and tibial plafond. Comminuted fracture of the mid to distal fibular diaphysis with 12 mm medial displacement, 11 mm posterior displacement, and 1.8 cm of overriding fragments. Nondisplaced fracture through the proximal fifth metatarsal. The ankle mortise is symmetric. The talar dome is intact. Small tibiotalar joint effusion. Joint spaces are preserved. Bone  mineralization is normal. Diffuse soft tissue swelling of the lower leg. IMPRESSION: 1. Comminuted, displaced fractures of the mid to distal tibial and fibular diaphyses as described above. The tibial fracture extends into the medial malleolus and tibial plafond. 2. Nondisplaced fracture of the proximal fifth metatarsal. Electronically Signed   By: Obie Dredge M.D.   On: 10/31/2017 20:58    Procedures Procedures (including critical care time)  Medications Ordered in ED Medications  HYDROmorphone (DILAUDID) injection 1 mg (1 mg Intravenous Given 10/31/17 2020)  sodium chloride 0.9 % bolus 1,000 mL (1,000 mLs Intravenous New Bag/Given 10/31/17 2020)  ondansetron (ZOFRAN) injection 4 mg (4 mg Intravenous Given 10/31/17 2048)     Initial Impression / Assessment and Plan / ED Course  I have reviewed the triage vital signs and the nursing notes.  Pertinent labs & imaging results that were available during my care of the patient were reviewed by me and considered in my medical decision making (see chart for details).     9:42 PM Patient tolerating the application of splint well. No complications. I discussed his case with our orthopedic surgeon, and patient will be admitted for monitoring of his leg fracture, concern for compartment syndrome, operative repair. Patient will be admitted to IM given his Hx of MMP, including HIV, DM, asthma  Final Clinical Impressions(s) / ED Diagnoses  Fall, initial encounter Right lower extremity fracture, tibia, fibula, initial encounter, closed  Gerhard Munch, MD 10/31/17 2143

## 2017-10-31 NOTE — ED Triage Notes (Addendum)
[place], stepped in a hole, heard a pop, and has had pain to R ankle/leg with deformity. EMS unable to palpate a pulse, CMS intact. Pulse dopplered on arrival. Pt received Fentanyl enroute.

## 2017-10-31 NOTE — Progress Notes (Signed)
Orthopedic Tech Progress Note Patient Details:  Tustin Board 1985-06-23 [id number]  Ortho Devices Type of Ortho Device: Post (short leg) splint, Stirrup splint Ortho Device/Splint Location: rle Ortho Device/Splint Interventions: Ordered, Application, Adjustment   Post Interventions Patient Tolerated: Well Instructions Provided: Care of device, Adjustment of device   Trinna Post 10/31/2017, 10:16 PM

## 2017-11-01 ENCOUNTER — Inpatient Hospital Stay (HOSPITAL_COMMUNITY): Payer: Self-pay | Admitting: Certified Registered Nurse Anesthetist

## 2017-11-01 ENCOUNTER — Encounter (HOSPITAL_COMMUNITY): Payer: Self-pay | Admitting: Family Medicine

## 2017-11-01 ENCOUNTER — Inpatient Hospital Stay (HOSPITAL_COMMUNITY): Payer: Self-pay

## 2017-11-01 ENCOUNTER — Encounter (HOSPITAL_COMMUNITY)
Admission: EM | Disposition: A | Discharge status: Home / Self Care | Payer: Self-pay | Source: Home / Self Care | Attending: Internal Medicine

## 2017-11-01 ENCOUNTER — Other Ambulatory Visit: Payer: Self-pay

## 2017-11-01 DIAGNOSIS — S82251A Displaced comminuted fracture of shaft of right tibia, initial encounter for closed fracture: Secondary | ICD-10-CM

## 2017-11-01 DIAGNOSIS — J4520 Mild intermittent asthma, uncomplicated: Secondary | ICD-10-CM

## 2017-11-01 DIAGNOSIS — F329 Major depressive disorder, single episode, unspecified: Secondary | ICD-10-CM

## 2017-11-01 DIAGNOSIS — E109 Type 1 diabetes mellitus without complications: Secondary | ICD-10-CM

## 2017-11-01 HISTORY — PX: IM NAILING TIBIA: SUR734

## 2017-11-01 HISTORY — DX: Displaced comminuted fracture of shaft of right tibia, initial encounter for closed fracture: S82.251A

## 2017-11-01 HISTORY — PX: ORIF ANKLE FRACTURE: SHX5408

## 2017-11-01 HISTORY — PX: ORIF ANKLE FRACTURE: SUR919

## 2017-11-01 HISTORY — PX: TIBIA IM NAIL INSERTION: SHX2516

## 2017-11-01 LAB — GLUCOSE, CAPILLARY
GLUCOSE-CAPILLARY: 138 mg/dL — AB (ref 70–99)
GLUCOSE-CAPILLARY: 204 mg/dL — AB (ref 70–99)
GLUCOSE-CAPILLARY: 237 mg/dL — AB (ref 70–99)
Glucose-Capillary: 249 mg/dL — ABNORMAL HIGH (ref 70–99)
Glucose-Capillary: 168 mg/dL — ABNORMAL HIGH (ref 70–99)
Glucose-Capillary: 166 mg/dL — ABNORMAL HIGH (ref 70–99)
Glucose-Capillary: 214 mg/dL — ABNORMAL HIGH (ref 70–99)

## 2017-11-01 LAB — BASIC METABOLIC PANEL
Anion gap: 11 (ref 5–15)
BUN: 11 mg/dL (ref 6–20)
CALCIUM: 8.8 mg/dL — AB (ref 8.9–10.3)
CO2: 25 mmol/L (ref 22–32)
CREATININE: 1.03 mg/dL (ref 0.61–1.24)
Chloride: 103 mmol/L (ref 98–111)
Glucose, Bld: 242 mg/dL — ABNORMAL HIGH (ref 70–99)
Potassium: 4.3 mmol/L (ref 3.5–5.1)
Sodium: 139 mmol/L (ref 135–145)

## 2017-11-01 LAB — MRSA PCR SCREENING: MRSA BY PCR: NEGATIVE

## 2017-11-01 LAB — TYPE AND SCREEN
ABO/RH(D): B POS
Antibody Screen: NEGATIVE

## 2017-11-01 LAB — CBC
HCT: 40.9 % (ref 39.0–52.0)
HEMOGLOBIN: 14 g/dL (ref 13.0–17.0)
MCH: 31.5 pg (ref 26.0–34.0)
MCHC: 34.2 g/dL (ref 30.0–36.0)
MCV: 91.9 fL (ref 78.0–100.0)
Platelets: 249 10*3/uL (ref 150–400)
RBC: 4.45 MIL/uL (ref 4.22–5.81)
RDW: 13.2 % (ref 11.5–15.5)
WBC: 10.7 10*3/uL — ABNORMAL HIGH (ref 4.0–10.5)

## 2017-11-01 LAB — PROTIME-INR
INR: 0.99
Prothrombin Time: 13 seconds (ref 11.4–15.2)

## 2017-11-01 LAB — ABO/RH: ABO/RH(D): B POS

## 2017-11-01 LAB — SURGICAL PCR SCREEN
MRSA, PCR: NEGATIVE
STAPHYLOCOCCUS AUREUS: NEGATIVE

## 2017-11-01 SURGERY — INSERTION, INTRAMEDULLARY ROD, TIBIA
Anesthesia: General | Site: Leg Lower | Laterality: Right

## 2017-11-01 MED ORDER — OXYCODONE HCL 5 MG PO TABS
ORAL_TABLET | ORAL | Status: AC
  Filled 2017-11-01: qty 1

## 2017-11-01 MED ORDER — ROCURONIUM BROMIDE 50 MG/5ML IV SOSY
PREFILLED_SYRINGE | INTRAVENOUS | Status: AC
  Filled 2017-11-01: qty 15

## 2017-11-01 MED ORDER — DEXAMETHASONE SODIUM PHOSPHATE 10 MG/ML IJ SOLN
INTRAMUSCULAR | Status: DC | PRN
  Administered 2017-11-01: 5 mg via INTRAVENOUS

## 2017-11-01 MED ORDER — INSULIN ASPART 100 UNIT/ML ~~LOC~~ SOLN
0.0000 [IU] | SUBCUTANEOUS | Status: DC
  Administered 2017-11-01 (×3): 3 [IU] via SUBCUTANEOUS
  Administered 2017-11-02: 5 [IU] via SUBCUTANEOUS
  Administered 2017-11-02: 3 [IU] via SUBCUTANEOUS
  Administered 2017-11-02: 2 [IU] via SUBCUTANEOUS

## 2017-11-01 MED ORDER — FENTANYL CITRATE (PF) 250 MCG/5ML IJ SOLN
INTRAMUSCULAR | Status: AC
  Filled 2017-11-01: qty 5

## 2017-11-01 MED ORDER — SUGAMMADEX SODIUM 200 MG/2ML IV SOLN
INTRAVENOUS | Status: DC | PRN
  Administered 2017-11-01: 200 mg via INTRAVENOUS

## 2017-11-01 MED ORDER — LACTATED RINGERS IV SOLN
INTRAVENOUS | Status: DC
  Administered 2017-11-01: 02:00:00 via INTRAVENOUS

## 2017-11-01 MED ORDER — PROPOFOL 10 MG/ML IV BOLUS
INTRAVENOUS | Status: AC
  Filled 2017-11-01: qty 20

## 2017-11-01 MED ORDER — METHOCARBAMOL 500 MG PO TABS
ORAL_TABLET | ORAL | Status: AC
  Filled 2017-11-01: qty 2

## 2017-11-01 MED ORDER — LIDOCAINE HCL (CARDIAC) PF 100 MG/5ML IV SOSY
PREFILLED_SYRINGE | INTRAVENOUS | Status: DC | PRN
  Administered 2017-11-01: 60 mg via INTRAVENOUS

## 2017-11-01 MED ORDER — KETOROLAC TROMETHAMINE 30 MG/ML IJ SOLN
INTRAMUSCULAR | Status: AC
  Filled 2017-11-01: qty 1

## 2017-11-01 MED ORDER — HYDRALAZINE HCL 20 MG/ML IJ SOLN
INTRAMUSCULAR | Status: AC
  Filled 2017-11-01: qty 1

## 2017-11-01 MED ORDER — BISACODYL 5 MG PO TBEC: 5.0000 mg | DELAYED_RELEASE_TABLET | Freq: Every day | ORAL | Status: DC | PRN

## 2017-11-01 MED ORDER — MUPIROCIN 2 % EX OINT
TOPICAL_OINTMENT | CUTANEOUS | Status: AC
  Administered 2017-11-01: 1 via TOPICAL
  Filled 2017-11-01: qty 22

## 2017-11-01 MED ORDER — ONDANSETRON HCL 4 MG/2ML IJ SOLN: 4.0000 mg | Freq: Four times a day (QID) | INTRAMUSCULAR | Status: DC | PRN

## 2017-11-01 MED ORDER — ELVITEG-COBIC-EMTRICIT-TENOFAF 150-150-200-10 MG PO TABS
1.0000 | ORAL_TABLET | Freq: Every day | ORAL | Status: DC
  Administered 2017-11-02 – 2017-11-03 (×2): 1 via ORAL
  Filled 2017-11-01 (×3): qty 1

## 2017-11-01 MED ORDER — ONDANSETRON HCL 4 MG/2ML IJ SOLN
INTRAMUSCULAR | Status: AC
  Filled 2017-11-01: qty 2

## 2017-11-01 MED ORDER — 0.9 % SODIUM CHLORIDE (POUR BTL) OPTIME
TOPICAL | Status: DC | PRN
  Administered 2017-11-01: 1000 mL

## 2017-11-01 MED ORDER — KETAMINE HCL 10 MG/ML IJ SOLN
INTRAMUSCULAR | Status: DC | PRN
  Administered 2017-11-01: 50 mg via INTRAVENOUS

## 2017-11-01 MED ORDER — BACITRACIN ZINC 500 UNIT/GM EX OINT
TOPICAL_OINTMENT | CUTANEOUS | Status: AC
  Filled 2017-11-01: qty 28.35

## 2017-11-01 MED ORDER — MUPIROCIN 2 % EX OINT
1.0000 "application " | TOPICAL_OINTMENT | Freq: Once | CUTANEOUS | Status: AC
  Administered 2017-11-01: 1 via TOPICAL

## 2017-11-01 MED ORDER — BUPIVACAINE HCL 0.5 % IJ SOLN
INTRAMUSCULAR | Status: DC | PRN
  Administered 2017-11-01: 30 mL

## 2017-11-01 MED ORDER — MIDAZOLAM HCL 2 MG/2ML IJ SOLN
INTRAMUSCULAR | Status: AC
  Filled 2017-11-01: qty 2

## 2017-11-01 MED ORDER — INFLUENZA VAC SPLIT QUAD 0.5 ML IM SUSY
0.5000 mL | PREFILLED_SYRINGE | INTRAMUSCULAR | Status: AC
  Administered 2017-11-03: 0.5 mL via INTRAMUSCULAR
  Filled 2017-11-01: qty 0.5

## 2017-11-01 MED ORDER — HYDROCODONE-ACETAMINOPHEN 7.5-325 MG PO TABS
1.0000 | ORAL_TABLET | ORAL | Status: DC | PRN
  Administered 2017-11-01 – 2017-11-02 (×2): 2 via ORAL
  Filled 2017-11-01: qty 2

## 2017-11-01 MED ORDER — SUCCINYLCHOLINE CHLORIDE 200 MG/10ML IV SOSY
PREFILLED_SYRINGE | INTRAVENOUS | Status: AC
  Filled 2017-11-01: qty 10

## 2017-11-01 MED ORDER — PROPOFOL 10 MG/ML IV BOLUS
INTRAVENOUS | Status: DC | PRN
  Administered 2017-11-01: 200 mg via INTRAVENOUS

## 2017-11-01 MED ORDER — BACITRACIN 500 UNIT/GM EX OINT
TOPICAL_OINTMENT | CUTANEOUS | Status: DC | PRN
  Administered 2017-11-01: 1 via TOPICAL

## 2017-11-01 MED ORDER — VANCOMYCIN HCL 1000 MG IV SOLR
INTRAVENOUS | Status: AC
  Filled 2017-11-01: qty 1000

## 2017-11-01 MED ORDER — OXYCODONE HCL 5 MG PO TABS: 5.0000 mg | ORAL_TABLET | Freq: Once | ORAL | Status: DC | PRN

## 2017-11-01 MED ORDER — CHLORHEXIDINE GLUCONATE 4 % EX LIQD
60.0000 mL | Freq: Once | CUTANEOUS | Status: AC
  Administered 2017-11-01: 4 via TOPICAL

## 2017-11-01 MED ORDER — FENTANYL CITRATE (PF) 100 MCG/2ML IJ SOLN
INTRAMUSCULAR | Status: DC | PRN
  Administered 2017-11-01 (×2): 50 ug via INTRAVENOUS
  Administered 2017-11-01: 100 ug via INTRAVENOUS
  Administered 2017-11-01: 50 ug via INTRAVENOUS
  Administered 2017-11-01: 100 ug via INTRAVENOUS
  Administered 2017-11-01: 50 ug via INTRAVENOUS

## 2017-11-01 MED ORDER — POVIDONE-IODINE 10 % EX SWAB: 2.0000 "application " | Freq: Once | CUTANEOUS | Status: DC

## 2017-11-01 MED ORDER — ROCURONIUM BROMIDE 100 MG/10ML IV SOLN
INTRAVENOUS | Status: DC | PRN
  Administered 2017-11-01: 20 mg via INTRAVENOUS
  Administered 2017-11-01 (×2): 50 mg via INTRAVENOUS
  Administered 2017-11-01: 30 mg via INTRAVENOUS

## 2017-11-01 MED ORDER — KETAMINE HCL 100 MG/ML IJ SOLN
INTRAMUSCULAR | Status: AC
  Filled 2017-11-01: qty 1

## 2017-11-01 MED ORDER — MIDAZOLAM HCL 5 MG/5ML IJ SOLN
INTRAMUSCULAR | Status: DC | PRN
  Administered 2017-11-01: 2 mg via INTRAVENOUS

## 2017-11-01 MED ORDER — CITALOPRAM HYDROBROMIDE 20 MG PO TABS
20.0000 mg | ORAL_TABLET | Freq: Every day | ORAL | Status: DC
  Administered 2017-11-02 – 2017-11-03 (×2): 20 mg via ORAL
  Filled 2017-11-01 (×2): qty 1

## 2017-11-01 MED ORDER — LACTATED RINGERS IV SOLN
INTRAVENOUS | Status: AC
  Administered 2017-11-01 (×3): via INTRAVENOUS

## 2017-11-01 MED ORDER — ROCURONIUM BROMIDE 50 MG/5ML IV SOSY
PREFILLED_SYRINGE | INTRAVENOUS | Status: AC
  Filled 2017-11-01: qty 5

## 2017-11-01 MED ORDER — KETOROLAC TROMETHAMINE 15 MG/ML IJ SOLN
INTRAMUSCULAR | Status: AC
  Filled 2017-11-01: qty 1

## 2017-11-01 MED ORDER — INSULIN DETEMIR 100 UNIT/ML ~~LOC~~ SOLN
15.0000 [IU] | Freq: Two times a day (BID) | SUBCUTANEOUS | Status: DC
  Administered 2017-11-01 – 2017-11-02 (×3): 15 [IU] via SUBCUTANEOUS
  Filled 2017-11-01 (×4): qty 0.15

## 2017-11-01 MED ORDER — METHOCARBAMOL 1000 MG/10ML IJ SOLN
1000.0000 mg | Freq: Three times a day (TID) | INTRAVENOUS | Status: DC
  Administered 2017-11-01 – 2017-11-02 (×2): 1000 mg via INTRAVENOUS
  Filled 2017-11-01 (×9): qty 10

## 2017-11-01 MED ORDER — DEXAMETHASONE SODIUM PHOSPHATE 10 MG/ML IJ SOLN
INTRAMUSCULAR | Status: AC
  Filled 2017-11-01: qty 2

## 2017-11-01 MED ORDER — CEFAZOLIN SODIUM-DEXTROSE 2-4 GM/100ML-% IV SOLN
2.0000 g | Freq: Three times a day (TID) | INTRAVENOUS | Status: AC
  Administered 2017-11-01 – 2017-11-02 (×3): 2 g via INTRAVENOUS
  Filled 2017-11-01 (×3): qty 100

## 2017-11-01 MED ORDER — HYDROMORPHONE HCL 1 MG/ML IJ SOLN
0.2500 mg | INTRAMUSCULAR | Status: DC | PRN
  Administered 2017-11-01: 0.5 mg via INTRAVENOUS

## 2017-11-01 MED ORDER — OXYCODONE HCL 5 MG/5ML PO SOLN: 5.0000 mg | Freq: Once | ORAL | Status: DC | PRN

## 2017-11-01 MED ORDER — BUPIVACAINE HCL (PF) 0.25 % IJ SOLN
INTRAMUSCULAR | Status: AC
  Filled 2017-11-01: qty 30

## 2017-11-01 MED ORDER — LIDOCAINE 2% (20 MG/ML) 5 ML SYRINGE
INTRAMUSCULAR | Status: AC
  Filled 2017-11-01: qty 5

## 2017-11-01 MED ORDER — HYDRALAZINE HCL 20 MG/ML IJ SOLN
10.0000 mg | Freq: Once | INTRAMUSCULAR | Status: AC
  Administered 2017-11-01: 10 mg via INTRAVENOUS

## 2017-11-01 MED ORDER — ACETAMINOPHEN 650 MG RE SUPP: 650.0000 mg | Freq: Four times a day (QID) | RECTAL | Status: DC | PRN

## 2017-11-01 MED ORDER — HYDROMORPHONE HCL 1 MG/ML IJ SOLN
1.0000 mg | INTRAMUSCULAR | Status: DC | PRN
  Administered 2017-11-01 (×2): 1 mg via INTRAVENOUS
  Filled 2017-11-01 (×2): qty 1

## 2017-11-01 MED ORDER — HYDROCODONE-ACETAMINOPHEN 7.5-325 MG PO TABS
ORAL_TABLET | ORAL | Status: AC
  Filled 2017-11-01: qty 2

## 2017-11-01 MED ORDER — CEFAZOLIN SODIUM-DEXTROSE 2-4 GM/100ML-% IV SOLN
2.0000 g | INTRAVENOUS | Status: AC
  Administered 2017-11-01: 2 g via INTRAVENOUS
  Filled 2017-11-01: qty 100

## 2017-11-01 MED ORDER — FENTANYL CITRATE (PF) 100 MCG/2ML IJ SOLN
INTRAMUSCULAR | Status: AC
  Filled 2017-11-01: qty 2

## 2017-11-01 MED ORDER — VANCOMYCIN HCL 1000 MG IV SOLR
INTRAVENOUS | Status: DC | PRN
  Administered 2017-11-01: 1000 mg via TOPICAL

## 2017-11-01 MED ORDER — ONDANSETRON HCL 4 MG PO TABS: 4.0000 mg | ORAL_TABLET | Freq: Four times a day (QID) | ORAL | Status: DC | PRN

## 2017-11-01 MED ORDER — CEFAZOLIN SODIUM-DEXTROSE 2-4 GM/100ML-% IV SOLN: 2.0000 g | INTRAVENOUS | Status: AC

## 2017-11-01 MED ORDER — METHOCARBAMOL 500 MG PO TABS
1000.0000 mg | ORAL_TABLET | Freq: Three times a day (TID) | ORAL | Status: DC
  Administered 2017-11-01 – 2017-11-03 (×6): 1000 mg via ORAL
  Filled 2017-11-01 (×6): qty 2

## 2017-11-01 MED ORDER — KETOROLAC TROMETHAMINE 15 MG/ML IJ SOLN
15.0000 mg | Freq: Four times a day (QID) | INTRAMUSCULAR | Status: DC
  Administered 2017-11-01 – 2017-11-03 (×10): 15 mg via INTRAVENOUS
  Filled 2017-11-01 (×11): qty 1

## 2017-11-01 MED ORDER — KETAMINE HCL 50 MG/5ML IJ SOSY
PREFILLED_SYRINGE | INTRAMUSCULAR | Status: AC
  Filled 2017-11-01: qty 5

## 2017-11-01 MED ORDER — ONDANSETRON HCL 4 MG/2ML IJ SOLN
INTRAMUSCULAR | Status: DC | PRN
  Administered 2017-11-01: 4 mg via INTRAVENOUS

## 2017-11-01 MED ORDER — GABAPENTIN 300 MG PO CAPS
300.0000 mg | ORAL_CAPSULE | Freq: Every day | ORAL | Status: DC
  Administered 2017-11-01 – 2017-11-02 (×2): 300 mg via ORAL
  Filled 2017-11-01 (×2): qty 1

## 2017-11-01 MED ORDER — DIPHENHYDRAMINE HCL 12.5 MG/5ML PO ELIX: 12.5000 mg | ORAL_SOLUTION | ORAL | Status: DC | PRN

## 2017-11-01 MED ORDER — ALBUTEROL SULFATE (2.5 MG/3ML) 0.083% IN NEBU: 2.5000 mg | INHALATION_SOLUTION | Freq: Four times a day (QID) | RESPIRATORY_TRACT | Status: DC | PRN

## 2017-11-01 MED ORDER — HYDROMORPHONE HCL 1 MG/ML IJ SOLN
INTRAMUSCULAR | Status: AC
  Filled 2017-11-01: qty 1

## 2017-11-01 MED ORDER — PHENYLEPHRINE 40 MCG/ML (10ML) SYRINGE FOR IV PUSH (FOR BLOOD PRESSURE SUPPORT)
PREFILLED_SYRINGE | INTRAVENOUS | Status: AC
  Filled 2017-11-01: qty 10

## 2017-11-01 MED ORDER — ACETAMINOPHEN 325 MG PO TABS: 650.0000 mg | ORAL_TABLET | Freq: Four times a day (QID) | ORAL | Status: DC | PRN

## 2017-11-01 MED ORDER — DOCUSATE SODIUM 100 MG PO CAPS
100.0000 mg | ORAL_CAPSULE | Freq: Two times a day (BID) | ORAL | Status: DC
  Administered 2017-11-01 – 2017-11-03 (×4): 100 mg via ORAL
  Filled 2017-11-01 (×4): qty 1

## 2017-11-01 SURGICAL SUPPLY — 73 items
BIT DRILL 2.5 X LONG (BIT) ×2
BIT DRILL CAL 3.2 LONG (BIT) IMPLANT
BIT DRILL CAL 3.2MM LONG (BIT)
BIT DRILL CALIBRATED 4.2 (BIT) ×2 IMPLANT
BIT DRILL QC 3.5X110 (BIT) ×4 IMPLANT
BIT DRILL SHORT 4.2 (BIT) ×4 IMPLANT
BIT DRILL X LONG 2.5 (BIT) ×2 IMPLANT
BLADE SURG 10 STRL SS (BLADE) ×8 IMPLANT
BRUSH SCRUB SURG 4.25 DISP (MISCELLANEOUS) ×8 IMPLANT
CHLORAPREP W/TINT 26ML (MISCELLANEOUS) ×4 IMPLANT
COVER SURGICAL LIGHT HANDLE (MISCELLANEOUS) ×8 IMPLANT
DRAPE C-ARM 42X72 X-RAY (DRAPES) ×4 IMPLANT
DRAPE C-ARMOR (DRAPES) ×4 IMPLANT
DRAPE HALF SHEET 40X57 (DRAPES) ×8 IMPLANT
DRAPE IMP U-DRAPE 54X76 (DRAPES) ×8 IMPLANT
DRAPE INCISE 23X17 IOBAN STRL (DRAPES) ×2
DRAPE INCISE IOBAN 23X17 STRL (DRAPES) ×2 IMPLANT
DRAPE ORTHO SPLIT 77X108 STRL (DRAPES) ×6
DRAPE SURG ORHT 6 SPLT 77X108 (DRAPES) ×6 IMPLANT
DRAPE U-SHAPE 47X51 STRL (DRAPES) ×4 IMPLANT
DRILL BIT CALIBRATED 4.2 (BIT) ×4
DRILL BIT SHORT 4.2 (BIT) ×4
DRILL BIT X LONG 2.5 (BIT) ×2
DRSG PAD ABDOMINAL 8X10 ST (GAUZE/BANDAGES/DRESSINGS) ×12 IMPLANT
ELECT REM PT RETURN 9FT ADLT (ELECTROSURGICAL) ×4
ELECTRODE REM PT RTRN 9FT ADLT (ELECTROSURGICAL) ×2 IMPLANT
GAUZE SPONGE 4X4 12PLY STRL (GAUZE/BANDAGES/DRESSINGS) ×4 IMPLANT
GLOVE BIO SURGEON STRL SZ7.5 (GLOVE) ×20 IMPLANT
GLOVE BIOGEL PI IND STRL 7.5 (GLOVE) ×2 IMPLANT
GLOVE BIOGEL PI INDICATOR 7.5 (GLOVE) ×2
GOWN STRL REUS W/ TWL LRG LVL3 (GOWN DISPOSABLE) ×8 IMPLANT
GOWN STRL REUS W/TWL LRG LVL3 (GOWN DISPOSABLE) ×8
GUIDEWIRE 3.2X400 (WIRE) ×4 IMPLANT
KIT BASIN OR (CUSTOM PROCEDURE TRAY) ×4 IMPLANT
KIT TURNOVER KIT B (KITS) ×4 IMPLANT
MANIFOLD NEPTUNE II (INSTRUMENTS) ×4 IMPLANT
NAIL TIB 10X390 (Nail) ×4 IMPLANT
NEEDLE 25GAX1.5 (MISCELLANEOUS) ×4 IMPLANT
NEEDLE HYPO 21X1.5 SAFETY (NEEDLE) IMPLANT
NS IRRIG 1000ML POUR BTL (IV SOLUTION) ×4 IMPLANT
PACK TOTAL JOINT (CUSTOM PROCEDURE TRAY) ×4 IMPLANT
PAD ARMBOARD 7.5X6 YLW CONV (MISCELLANEOUS) ×8 IMPLANT
PAD CAST 4YDX4 CTTN HI CHSV (CAST SUPPLIES) ×4 IMPLANT
PADDING CAST COTTON 4X4 STRL (CAST SUPPLIES) ×4
PADDING CAST COTTON 6X4 STRL (CAST SUPPLIES) ×4 IMPLANT
PLATE LCP 3.5 1/3 TUB 5HX57 (Plate) ×4 IMPLANT
REAMER ROD DEEP FLUTE 2.5X950 (INSTRUMENTS) ×4 IMPLANT
SCREW CORT HEADED ST 3.5X44 (Screw) ×4 IMPLANT
SCREW CORT LP ST 3.5X22 (Screw) ×4 IMPLANT
SCREW HEADED ST 3.5X20 (Screw) ×4 IMPLANT
SCREW HEADED ST 3.5X40 (Screw) ×4 IMPLANT
SCREW LOCKING 5.0MMX40MM (Screw) ×8 IMPLANT
SCREW LOCKING 5.0X38MM (Screw) ×4 IMPLANT
SCREW LOCKING 5.0X48MM (Screw) ×4 IMPLANT
SCREW LOCKING 5.0X74 STRL ×4 IMPLANT
SPLINT PLASTER CAST XFAST 5X30 (CAST SUPPLIES) ×2 IMPLANT
SPLINT PLASTER XFAST SET 5X30 (CAST SUPPLIES) ×2
SPONGE LAP 18X18 X RAY DECT (DISPOSABLE) IMPLANT
STAPLER VISISTAT 35W (STAPLE) ×4 IMPLANT
SUCTION FRAZIER HANDLE 10FR (MISCELLANEOUS) ×2
SUCTION TUBE FRAZIER 10FR DISP (MISCELLANEOUS) ×2 IMPLANT
SUT ETHILON 3 0 PS 1 (SUTURE) ×12 IMPLANT
SUT MNCRL AB 3-0 PS2 18 (SUTURE) ×4 IMPLANT
SUT PROLENE 0 CT (SUTURE) IMPLANT
SUT VIC AB 0 CT1 27 (SUTURE) ×2
SUT VIC AB 0 CT1 27XBRD ANBCTR (SUTURE) ×2 IMPLANT
SUT VIC AB 2-0 CT1 27 (SUTURE) ×2
SUT VIC AB 2-0 CT1 TAPERPNT 27 (SUTURE) ×2 IMPLANT
SYR CONTROL 10ML LL (SYRINGE) ×4 IMPLANT
TOWEL GREEN STERILE (TOWEL DISPOSABLE) ×4 IMPLANT
UNDERPAD 30X30 (UNDERPADS AND DIAPERS) ×4 IMPLANT
WATER STERILE IRR 1000ML POUR (IV SOLUTION) ×4 IMPLANT
YANKAUER SUCT BULB TIP NO VENT (SUCTIONS) IMPLANT

## 2017-11-01 NOTE — Op Note (Signed)
OrthopaedicSurgeryOperativeNote [phone]) Date of Surgery: 11/01/2017  Admit Date: 10/31/2017   Diagnoses: Pre-Op Diagnoses: Right closed tibia fracture Right closed medial malleolus fracture Right 5th metatarsal base fracture  Post-Op Diagnosis: Same  Procedures: 1. CPT 27759-Intramedullary nailing of right tibia 2. CPT 27766-Open reduction internal fixation of right medial malleolus 3. CPT 28475-Closed treatment of right 5th metatarsal base fracture  Surgeons: Primary: Roby Lofts, MD   Location:[facility] OR ROOM 07   AnesthesiaGeneral   Antibiotics:Ancef 2g preop   Tourniquettime:* No tourniquets in log * .  EstimatedBloodLoss:75 mL   Complications:None  Specimens:None  Implants: Implant Name Type Inv. Item Serial No. Manufacturer Lot No. LRB No. Used Action  NAIL TIB 10X390 - [id number] Nail NAIL TIB 10X390  [employer] [id number] Right 1 Implanted  SCREW LOCKING 5.0MMX40MM - [id number] Screw SCREW LOCKING 5.0MMX40MM  [employer] [id number] Right 1 Implanted  SCREW LOCKING 5.0MMX40MM - [id number] Screw SCREW LOCKING 5.0MMX40MM  [employer] [id number] Right 1 Implanted  SCREW LOCKING 5.0X48MM - [id number] Screw SCREW LOCKING 5.0X48MM  [employer] [id number] Right 1 Implanted  5. LOCKING SCREW STARDRIVE     [id number] Right 1 Implanted  SCREW LOCKING 5.0X38MM - [id number] Screw SCREW LOCKING 5.0X38MM  [employer] [id number] Right 1 Implanted  SCREW CORT HEADED ST 3.5X44 - [id number] Screw SCREW CORT HEADED ST 3.5X44  [employer]  Right 1 Implanted  PLATE TUBULAR W/COLLAR - [id number] Plate PLATE TUBULAR W/COLLAR  [employer]  Right 1 Implanted  SCREW HEADED ST 3.5X20 - [id number] Screw SCREW HEADED ST 3.5X20  [employer]  Right 1 Implanted  SCREW CORT HEADED ST 3.5X22 - [id number] Screw SCREW CORT HEADED ST 3.5X22  [employer]UMA  Right 1 Implanted  SCREW HEADED ST 3.5X40 - [id number] Screw SCREW HEADED ST 3.5X40  [employer]UMA  Right 1 Implanted     IndicationsforSurgery: 32 year old male insulin-dependent diabetes with a history of HIV and depression.  He stepped in a ditch and had immediate pain and deformity to his right lower extremity.  Right lower extremity shows no open wounds or compartments are soft and compressible.  He has no signs of active compartment syndrome.  Plan to proceed with open reduction internal fixation of his medial malleolus along with intramedullary nailing of his right tibia fracture.  Will evaluate and stress his fifth metatarsal base fracture and I decide whether or not this needs surgical fixation.  Discussed with him the risks of the procedure. Risks discussed included bleeding requiring blood transfusion, bleeding causing a hematoma, infection, malunion, nonunion, damage to surrounding nerves and blood vessels, pain, hardware prominence or irritation, hardware failure, stiffness, post-traumatic arthritis, DVT/PE, compartment syndrome, and even death.  Also emphasized the importance of strict diabetic control.  Patient agrees to proceed with surgery and consent was obtained.  Operative Findings: 1. Intramedullary nailing of right tibial shaft fracture using a Synthes EX tibial nail size 10 x 390 mm with 3 distal interlocks and 2 proximal interlocks 2.  Open reduction internal fixation of medial malleolus using one third tubular Synthes buttress plate with 3.5 mm lag screws.  Procedure: The patient was identified in the preoperative holding area. Consent was confirmed with the patient and their family and all questions were answered. The operative extremity was marked after confirmation with the patient. They were then brought back to the operating room by our anesthesia colleagues.  The patient was placed under general anesthetic and then carefully transferred over to a radiolucent flat top table.  Here a bump was placed  under the operative hip and a bone foam was placed under the operative extremity.  The  operative extremity was then prepped and draped in usual sterile fashion. A preoperative timeout was performed to verify the patient, the procedure, and the extremity. Preoperative antibiotics were dosed.  Fluoroscopic imaging was obtained to show the displacement of the fracture.   I made a lateral parapatellar incision carried down through skin and subcutaneous tissue just lateral to the patellar tendon.  I released a portion of the lateral retinaculum but stayed extra-articular outside the capsule.  I excised the anterior fat pad and then proceeded to place a guidepin under fluoroscopic imaging to confirm adequate placement in both the AP and lateral views.  I then advanced a wire into the proximal metaphysis of the tibia.  I then used an entry reamer to enter the canal.  I passed a bent ball-tipped guidewire down the center of the canal and was able to pass in the distal segment after performing a closed reduction maneuver.  I seated it down into the physeal scar.  I then measured the length of the nail and I chose a 390 mm nail.  I then proceeded to sequentially ream up from 8.5 mm to 11 mm.  I obtained excellent chatter and I chose to place an 10 mm nail.  I then placed nail across the fracture into the distal segment.  The fracture had excellent reduction after the nail was placed.  I seated the nail to where it was slightly buried at the lateral of the knee.  I then placed 2 distal interlocking screws from medial to lateral using perfect circle technique.  An anterior medial posterior lateral interlock was placed as well.  I back slapped the nail to provide some compression at the fracture site.  I then used my proximal jig to place 2 proximal interlocking screws through percutaneous incisions.  I removed the jig and obtained fluoroscopic imaging.    I then extended wound of the distal interlocking incisions to a standard approach to the medial malleolus.  The periosteum was still intact to the  fracture.  Made a small percutaneous incision over the anterior lateral tibia and used a reduction clamp to reduce the articular surface of the plafond.  A 3.5 mm lag screw was placed perpendicular to the fracture and then a one third tubular plate was contoured and placed in buttress fashion to hold the fracture in place.  Final fluoroscopic images were obtained.  I also took an image of the fifth metatarsal and felt that he was stable and did not move with stress as result I plan to treat this nonoperatively.  The incisions were copiously irrigated.  I closed the lateral parapatellar incision with 0 Vicryl, 2-0 Vicryl and 3-0 nylon.  The remainder the incisions were closed with 2-0 Vicryl and 3-0 nylon.  A sterile dressing consisting of bacitracin ointment, Adaptic, 4 x 4's and sterile cast padding was placed.  A well-padded short leg splint was then placed as well.. The patient was then awoken from anesthesia and taken to the PACU in stable condition.   Post Op Plan/Instructions: The patient will be nonweightbearing to the right lower extremity.  He will receive postoperative Ancef.  He will receive aspirin for DVT prophylaxis.  He will mobilize with physical and occupational therapy.  I was present and performed the entire surgery.  Truitt Merle, MD [facility]

## 2017-11-01 NOTE — Progress Notes (Signed)
PROGRESS NOTE  [name] ZDG:[id number] DOB: 1985-04-05 DOA: 10/31/2017 PCP: Lavinia Sharps, NP  HPI/Recap of past 24 hours:  Patient is seen after returned from OR  Assessment/Plan: Principal Problem:   Closed displaced comminuted fracture of shaft of right tibia Active Problems:   Diabetes mellitus type 1 (HCC)   Asthma   Human immunodeficiency virus (HIV) disease (HCC)   Depression  Right closed tibia fracture Right closed medial malleolus fracture Right 5th metatarsal base fracture  - Presents with severe right lower leg pain and deformity after stepping into a hole and hearing a "pop"  - Radiographs confirm comminuted displaced fractures of mid-distal tibia and fibula  - ortho to bring to OR today, postop weight bearing status, dvt prophylaxis, wound care, pain control per ortho -ortho input appreciated   DM1 , likely with peripheral neuropathy He reports he is followed by [facility] He reports recent a1c above 10 Insulin, Start neurontin qhs  HIV CD4 560 and VL undetectable in April 2019  - Continue Genvoya   Asthma  - No wheezing or SOB  - Continue prn albuterol    Depression with anxiety   - Continue Celexa   Patient reports has no insurance, no pcp Family request Child psychotherapist to help  Code Status: full  Family Communication: patient and family at bedside  Disposition Plan: pending   Consultants:  ortho  Procedures:  ORIF  Antibiotics:  perioperative   Objective: BP (!) 143/93 (BP Location: Right Arm)   Pulse 73   Temp 99.6 F (37.6 C) (Oral)   Resp 18   SpO2 100%   Intake/Output Summary (Last 24 hours) at 11/01/2017 0807 Last data filed at 11/01/2017 0300 Gross per 24 hour  Intake 2000 ml  Output 800 ml  Net 1200 ml   There were no vitals filed for this visit.  Exam: Patient is examined daily including today on 11/01/2017, exams remain the same as of yesterday except that has changed    General:  NAD  Cardiovascular:  RRR  Respiratory: CTABL  Abdomen: Soft/ND/NT, positive BS  Musculoskeletal: right lower extremity post op changes  Neuro: alert, oriented   Data Reviewed: Basic Metabolic Panel: Recent Labs  Lab 10/31/17 2007 11/01/17 0400  NA 140 139  K 4.1 4.3  CL 105 103  CO2 24 25  GLUCOSE 208* 242*  BUN 11 11  CREATININE 1.34* 1.03  CALCIUM 9.1 8.8*   Liver Function Tests: No results for input(s): AST, ALT, ALKPHOS, BILITOT, PROT, ALBUMIN in the last 168 hours. No results for input(s): LIPASE, AMYLASE in the last 168 hours. No results for input(s): AMMONIA in the last 168 hours. CBC: Recent Labs  Lab 10/31/17 2007 11/01/17 0400  WBC 8.4 10.7*  NEUTROABS 3.8  --   HGB 14.7 14.0  HCT 43.4 40.9  MCV 93.7 91.9  PLT 254 249   Cardiac Enzymes:   No results for input(s): CKTOTAL, CKMB, CKMBINDEX, TROPONINI in the last 168 hours. BNP (last 3 results) No results for input(s): BNP in the last 8760 hours.  ProBNP (last 3 results) No results for input(s): PROBNP in the last 8760 hours.  CBG: Recent Labs  Lab 10/31/17 2019 11/01/17 0409 11/01/17 0728  GLUCAP 186* 249* 168*    Recent Results (from the past 240 hour(s))  MRSA PCR Screening     Status: None   Collection Time: 11/01/17  4:33 AM  Result Value Ref Range Status   MRSA by PCR NEGATIVE NEGATIVE Final  Comment:        The GeneXpert MRSA Assay (FDA approved for NASAL specimens only), is one component of a comprehensive MRSA colonization surveillance program. It is not intended to diagnose MRSA infection nor to guide or monitor treatment for MRSA infections. Performed at [facility], [address], [place], [place] [address]      Studies: Dg Tibia/fibula Right  Result Date: 10/31/2017 CLINICAL DATA:  Right lower leg pain after stepping in a hole. EXAM: RIGHT TIBIA AND FIBULA - 2 VIEW; RIGHT ANKLE - COMPLETE 3+ VIEW COMPARISON:  None. FINDINGS: Comminuted fracture of the mid to distal tibia  diaphysis with 11 mm medial displacement and 16 mm posterior displacement. There is a nondisplaced spiral component which extends into the medial malleolus and tibial plafond. Comminuted fracture of the mid to distal fibular diaphysis with 12 mm medial displacement, 11 mm posterior displacement, and 1.8 cm of overriding fragments. Nondisplaced fracture through the proximal fifth metatarsal. The ankle mortise is symmetric. The talar dome is intact. Small tibiotalar joint effusion. Joint spaces are preserved. Bone mineralization is normal. Diffuse soft tissue swelling of the lower leg. IMPRESSION: 1. Comminuted, displaced fractures of the mid to distal tibial and fibular diaphyses as described above. The tibial fracture extends into the medial malleolus and tibial plafond. 2. Nondisplaced fracture of the proximal fifth metatarsal. Electronically Signed   By: Obie Dredge M.D.   On: 10/31/2017 20:58   Dg Ankle Complete Right  Result Date: 10/31/2017 CLINICAL DATA:  Right lower leg pain after stepping in a hole. EXAM: RIGHT TIBIA AND FIBULA - 2 VIEW; RIGHT ANKLE - COMPLETE 3+ VIEW COMPARISON:  None. FINDINGS: Comminuted fracture of the mid to distal tibia diaphysis with 11 mm medial displacement and 16 mm posterior displacement. There is a nondisplaced spiral component which extends into the medial malleolus and tibial plafond. Comminuted fracture of the mid to distal fibular diaphysis with 12 mm medial displacement, 11 mm posterior displacement, and 1.8 cm of overriding fragments. Nondisplaced fracture through the proximal fifth metatarsal. The ankle mortise is symmetric. The talar dome is intact. Small tibiotalar joint effusion. Joint spaces are preserved. Bone mineralization is normal. Diffuse soft tissue swelling of the lower leg. IMPRESSION: 1. Comminuted, displaced fractures of the mid to distal tibial and fibular diaphyses as described above. The tibial fracture extends into the medial malleolus and tibial  plafond. 2. Nondisplaced fracture of the proximal fifth metatarsal. Electronically Signed   By: Obie Dredge M.D.   On: 10/31/2017 20:58    Scheduled Meds: . citalopram  20 mg Oral Daily  . docusate sodium  100 mg Oral BID  . elvitegravir-cobicistat-emtricitabine-tenofovir  1 tablet Oral Daily  . insulin aspart  0-9 Units Subcutaneous Q4H  . insulin detemir  15 Units Subcutaneous BID  . ketorolac  15 mg Intravenous Q6H  . methocarbamol  1,000 mg Oral Q8H    Continuous Infusions: .  ceFAZolin (ANCEF) IV    . lactated ringers 90 mL/hr at 11/01/17 0230  . methocarbamol (ROBAXIN) IV 1,000 mg (11/01/17 0612)     Time spent: I have personally reviewed and interpreted on  11/01/2017 daily labs, imagings as discussed above under date review session and assessment and plans.  I reviewed all nursing notes, pharmacy notes, consultant notes,  vitals, pertinent old records  I have discussed plan of care as described above with RN , patient and family on 11/01/2017   Albertine Grates MD, PhD  [facility] Pager [phone].  If 7PM-7AM, please contact night-coverage at [email or web address], password [facility] 11/01/2017, 8:07 AM  LOS: 1 day

## 2017-11-01 NOTE — Anesthesia Preprocedure Evaluation (Signed)
Anesthesia Evaluation  Patient identified by MRN, date of birth, ID band Patient awake    Reviewed: Allergy & Precautions, H&P , NPO status , Patient's Chart, lab work & pertinent test results  Airway Mallampati: II   Neck ROM: full    Dental   Pulmonary asthma , former smoker,    breath sounds clear to auscultation       Cardiovascular negative cardio ROS   Rhythm:regular Rate:Normal     Neuro/Psych  Headaches, PSYCHIATRIC DISORDERS Anxiety Depression    GI/Hepatic   Endo/Other  diabetes, Type 1, Insulin Dependent  Renal/GU      Musculoskeletal   Abdominal   Peds  Hematology   Anesthesia Other Findings   Reproductive/Obstetrics                             Anesthesia Physical Anesthesia Plan  ASA: II  Anesthesia Plan: General   Post-op Pain Management:    Induction: Intravenous  PONV Risk Score and Plan: 2 and Ondansetron, Dexamethasone, Midazolam and Treatment may vary due to age or medical condition  Airway Management Planned: Oral ETT  Additional Equipment:   Intra-op Plan:   Post-operative Plan: Extubation in OR  Informed Consent: I have reviewed the patients History and Physical, chart, labs and discussed the procedure including the risks, benefits and alternatives for the proposed anesthesia with the patient or authorized representative who has indicated his/her understanding and acceptance.     Plan Discussed with: CRNA, Anesthesiologist and Surgeon  Anesthesia Plan Comments:         Anesthesia Quick Evaluation

## 2017-11-01 NOTE — Anesthesia Procedure Notes (Signed)
Procedure Name: Intubation Date/Time: 11/01/2017 11:13 AM Performed by: Shirlyn Goltz, CRNA Pre-anesthesia Checklist: Patient identified, Emergency Drugs available, Suction available and Patient being monitored Patient Re-evaluated:Patient Re-evaluated prior to induction Oxygen Delivery Method: Circle system utilized Preoxygenation: Pre-oxygenation with 100% oxygen Induction Type: IV induction Ventilation: Mask ventilation without difficulty Laryngoscope Size: Mac and 4 Grade View: Grade III Tube type: Oral Tube size: 7.5 mm Number of attempts: 2 Airway Equipment and Method: Stylet Placement Confirmation: positive ETCO2,  ETT inserted through vocal cords under direct vision and breath sounds checked- equal and bilateral Secured at: 21 cm Tube secured with: Tape Dental Injury: Teeth and Oropharynx as per pre-operative assessment

## 2017-11-01 NOTE — Transfer of Care (Signed)
Immediate Anesthesia Transfer of Care Note  Patient: [name]  Procedure(s) Performed: INTRAMEDULLARY (IM) NAIL TIBIAL (Right Leg Lower) OPEN REDUCTION INTERNAL FIXATION (ORIF) ANKLE FRACTURE (Right Ankle)  Patient Location: PACU  Anesthesia Type:General  Level of Consciousness: drowsy  Airway & Oxygen Therapy: Patient Spontanous Breathing and Patient connected to nasal cannula oxygen  Post-op Assessment: Report given to RN and Post -op Vital signs reviewed and stable  Post vital signs: Reviewed and stable  Last Vitals:  Vitals Value Taken Time  BP 155/102 11/01/2017  1:43 PM  Temp    Pulse 91 11/01/2017  1:45 PM  Resp 16 11/01/2017  1:45 PM  SpO2 100 % 11/01/2017  1:45 PM  Vitals shown include unvalidated device data.  Last Pain:  Vitals:   11/01/17 0443  TempSrc: Oral  PainSc:          Complications: No apparent anesthesia complications

## 2017-11-01 NOTE — Progress Notes (Signed)
[facility]   OTS aware of pt  Full consult later this am  Comfortable in splint Will need operative intervention to address fracture  Plan for OR later this afternoon Keep NPO  Labs ordered Pain meds ordered Ice and elevate leg to level of heart   NWB R leg   Mearl Latin, PA-C [facility] [phone] (C) [phone] (O) 11/01/2017 1:23 AM

## 2017-11-01 NOTE — Consult Note (Signed)
Reason for Consult:Right tib/fib fxs Referring Physician: Shahiem Adams is an 32 y.o. male.  HPI: [name] was walking and stepped into a hole where his foot got trapped and he fell. He heard a pop and had immediate pain. He came to the ED where x-rays showed a tib/fib fx and a medial mal fx, and a 5th MT fx and orthopedic surgery was consulted. He c/o localized pain in the area. He works unloading trucks.  Past Medical History:  Diagnosis Date  . Anxiety   . Asthma    no prior hospitalizations, intubations  . Depression   . Headache    "2-3/day" (05/20/2016)  . HIV (human immunodeficiency virus infection) ([place])   . Migraine    "a few/month" (05/20/2016)  . Refusal of blood transfusions as patient is Jehovah's Witness   . Type I diabetes mellitus ([place])     Past Surgical History:  Procedure Laterality Date  . NO PAST SURGERIES      Family History  Problem Relation Age of Onset  . Thyroid disease Mother   . Diabetes Mother   . Breast cancer Maternal Grandmother   . Diabetes Maternal Grandmother     Social History:  reports that he has quit smoking. His smoking use included cigarettes. He started smoking about 2 years ago. He has never used smokeless tobacco. He reports that he drinks about 3.0 - 4.0 standard drinks of alcohol per week. He reports that he has current or past drug history. Drug: Marijuana. Frequency: 2.00 times per week.  Allergies:  Allergies  Allergen Reactions  . Shellfish Allergy Anaphylaxis and Hives    Medications: I have reviewed the patient's current medications.  Results for orders placed or performed during the hospital encounter of 10/31/17 (from the past 48 hour(s))  Basic metabolic panel     Status: Abnormal   Collection Time: 10/31/17  8:07 PM  Result Value Ref Range   Sodium 140 135 - 145 mmol/L   Potassium 4.1 3.5 - 5.1 mmol/L   Chloride 105 98 - 111 mmol/L   CO2 24 22 - 32 mmol/L   Glucose, Bld 208 (H) 70 - 99 mg/dL   BUN 11 6 -  20 mg/dL   Creatinine, Ser 1.34 (H) 0.61 - 1.24 mg/dL   Calcium 9.1 8.9 - 10.3 mg/dL   GFR calc non Af Amer >60 >60 mL/min   GFR calc Af Amer >60 >60 mL/min    Comment: (NOTE) The eGFR has been calculated using the CKD EPI equation. This calculation has not been validated in all clinical situations. eGFR's persistently <60 mL/min signify possible Chronic Kidney Disease.    Anion gap 11 5 - 15    Comment: Performed at [facility] [address] [place], [place] [address]  CBC with Differential     Status: None   Collection Time: 10/31/17  8:07 PM  Result Value Ref Range   WBC 8.4 4.0 - 10.5 K/uL   RBC 4.63 4.22 - 5.81 MIL/uL   Hemoglobin 14.7 13.0 - 17.0 g/dL   HCT 43.4 39.0 - 52.0 %   MCV 93.7 78.0 - 100.0 fL   MCH 31.7 26.0 - 34.0 pg   MCHC 33.9 30.0 - 36.0 g/dL   RDW 13.3 11.5 - 15.5 %   Platelets 254 150 - 400 K/uL   Neutrophils Relative % 46 %   Neutro Abs 3.8 1.7 - 7.7 K/uL   Lymphocytes Relative 43 %   Lymphs Abs  3.7 0.7 - 4.0 K/uL   Monocytes Relative 7 %   Monocytes Absolute 0.6 0.1 - 1.0 K/uL   Eosinophils Relative 3 %   Eosinophils Absolute 0.3 0.0 - 0.7 K/uL   Basophils Relative 1 %   Basophils Absolute 0.0 0.0 - 0.1 K/uL   Immature Granulocytes 0 %   Abs Immature Granulocytes 0.0 0.0 - 0.1 K/uL    Comment: Performed at [facility] [address] [place], [place] [address]  CBG monitoring, ED     Status: Abnormal   Collection Time: 10/31/17  8:19 PM  Result Value Ref Range   Glucose-Capillary 186 (H) 70 - 99 mg/dL  CBC     Status: Abnormal   Collection Time: 11/01/17  4:00 AM  Result Value Ref Range   WBC 10.7 (H) 4.0 - 10.5 K/uL   RBC 4.45 4.22 - 5.81 MIL/uL   Hemoglobin 14.0 13.0 - 17.0 g/dL   HCT 40.9 39.0 - 52.0 %   MCV 91.9 78.0 - 100.0 fL   MCH 31.5 26.0 - 34.0 pg   MCHC 34.2 30.0 - 36.0 g/dL   RDW 13.2 11.5 - 15.5 %   Platelets 249 150 - 400 K/uL    Comment: Performed at [facility], [place] [address] [place], [place]  [address]  Protime-INR     Status: None   Collection Time: 11/01/17  4:00 AM  Result Value Ref Range   Prothrombin Time 13.0 11.4 - 15.2 seconds   INR 0.99     Comment: Performed at [facility] [address] [place], [place] [address]  Type and screen [place]     Status: None   Collection Time: 11/01/17  4:00 AM  Result Value Ref Range   ABO/RH(D) B POS    Antibody Screen NEG    Sample Expiration      11/04/2017 Performed at [facility], [place] [address] [place], [place] [address]   Basic metabolic panel     Status: Abnormal   Collection Time: 11/01/17  4:00 AM  Result Value Ref Range   Sodium 139 135 - 145 mmol/L   Potassium 4.3 3.5 - 5.1 mmol/L   Chloride 103 98 - 111 mmol/L   CO2 25 22 - 32 mmol/L   Glucose, Bld 242 (H) 70 - 99 mg/dL   BUN 11 6 - 20 mg/dL   Creatinine, Ser 1.03 0.61 - 1.24 mg/dL   Calcium 8.8 (L) 8.9 - 10.3 mg/dL   GFR calc non Af Amer >60 >60 mL/min   GFR calc Af Amer >60 >60 mL/min    Comment: (NOTE) The eGFR has been calculated using the CKD EPI equation. This calculation has not been validated in all clinical situations. eGFR's persistently <60 mL/min signify possible Chronic Kidney Disease.    Anion gap 11 5 - 15    Comment: Performed at [facility] [address], [place],  [address]  ABO/Rh     Status: None (Preliminary result)   Collection Time: 11/01/17  4:00 AM  Result Value Ref Range   ABO/RH(D)      B POS Performed at [facility] [address], [place], [place] [address]   Glucose, capillary     Status: Abnormal   Collection Time: 11/01/17  4:09 AM  Result Value Ref Range   Glucose-Capillary 249 (H) 70 - 99 mg/dL  MRSA PCR Screening     Status: None  Collection Time: 11/01/17  4:33 AM  Result Value Ref Range   MRSA by PCR NEGATIVE NEGATIVE    Comment:        The GeneXpert MRSA Assay (FDA approved for NASAL specimens only), is one component of a comprehensive MRSA  colonization surveillance program. It is not intended to diagnose MRSA infection nor to guide or monitor treatment for MRSA infections. Performed at [facility], [place] [address], [place], [place] [address]   Glucose, capillary     Status: Abnormal   Collection Time: 11/01/17  7:28 AM  Result Value Ref Range   Glucose-Capillary 168 (H) 70 - 99 mg/dL    Dg Tibia/fibula Right  Result Date: 10/31/2017 CLINICAL DATA:  Right lower leg pain after stepping in a hole. EXAM: RIGHT TIBIA AND FIBULA - 2 VIEW; RIGHT ANKLE - COMPLETE 3+ VIEW COMPARISON:  None. FINDINGS: Comminuted fracture of the mid to distal tibia diaphysis with 11 mm medial displacement and 16 mm posterior displacement. There is a nondisplaced spiral component which extends into the medial malleolus and tibial plafond. Comminuted fracture of the mid to distal fibular diaphysis with 12 mm medial displacement, 11 mm posterior displacement, and 1.8 cm of overriding fragments. Nondisplaced fracture through the proximal fifth metatarsal. The ankle mortise is symmetric. The talar dome is intact. Small tibiotalar joint effusion. Joint spaces are preserved. Bone mineralization is normal. Diffuse soft tissue swelling of the lower leg. IMPRESSION: 1. Comminuted, displaced fractures of the mid to distal tibial and fibular diaphyses as described above. The tibial fracture extends into the medial malleolus and tibial plafond. 2. Nondisplaced fracture of the proximal fifth metatarsal. Electronically Signed   By: Titus Dubin M.D.   On: 10/31/2017 20:58   Dg Ankle Complete Right  Result Date: 10/31/2017 CLINICAL DATA:  Right lower leg pain after stepping in a hole. EXAM: RIGHT TIBIA AND FIBULA - 2 VIEW; RIGHT ANKLE - COMPLETE 3+ VIEW COMPARISON:  None. FINDINGS: Comminuted fracture of the mid to distal tibia diaphysis with 11 mm medial displacement and 16 mm posterior displacement. There is a nondisplaced spiral component which extends into the  medial malleolus and tibial plafond. Comminuted fracture of the mid to distal fibular diaphysis with 12 mm medial displacement, 11 mm posterior displacement, and 1.8 cm of overriding fragments. Nondisplaced fracture through the proximal fifth metatarsal. The ankle mortise is symmetric. The talar dome is intact. Small tibiotalar joint effusion. Joint spaces are preserved. Bone mineralization is normal. Diffuse soft tissue swelling of the lower leg. IMPRESSION: 1. Comminuted, displaced fractures of the mid to distal tibial and fibular diaphyses as described above. The tibial fracture extends into the medial malleolus and tibial plafond. 2. Nondisplaced fracture of the proximal fifth metatarsal. Electronically Signed   By: Titus Dubin M.D.   On: 10/31/2017 20:58    Review of Systems  Constitutional: Negative for weight loss.  HENT: Negative for ear discharge, ear pain, hearing loss and tinnitus.   Eyes: Negative for blurred vision, double vision, photophobia and pain.  Respiratory: Negative for cough, sputum production and shortness of breath.   Cardiovascular: Negative for chest pain.  Gastrointestinal: Negative for abdominal pain, nausea and vomiting.  Genitourinary: Negative for dysuria, flank pain, frequency and urgency.  Musculoskeletal: Positive for joint pain (Right lower leg). Negative for back pain, falls, myalgias and neck pain.  Neurological: Negative for dizziness, tingling, sensory change, focal weakness, loss of consciousness and headaches.  Endo/Heme/Allergies: Does not bruise/bleed easily.  Psychiatric/Behavioral: Negative for depression,  memory loss and substance abuse. The patient is not nervous/anxious.    Blood pressure (!) 143/93, pulse 73, temperature 99.6 F (37.6 C), temperature source Oral, resp. rate 18, SpO2 100 %. Physical Exam  Constitutional: He appears well-developed and well-nourished. No distress.  HENT:  Head: Normocephalic and atraumatic.  Eyes: Conjunctivae  are normal. Right eye exhibits no discharge. Left eye exhibits no discharge. No scleral icterus.  Neck: Normal range of motion.  Cardiovascular: Normal rate and regular rhythm.  Respiratory: Effort normal. No respiratory distress.  Musculoskeletal:  RLE No traumatic wounds, ecchymosis, or rash  Short leg splint intact, anteromedial compartment firm  No knee effusion  Knee stable to varus/ valgus and anterior/posterior stress  Sens DPN, SPN, TN intact but TN distribution feels "stiff"  Motor EHL, ext, flex, evers 5/5  DP 1+, No significant edema  Neurological: He is alert.  Skin: Skin is warm and dry. He is not diaphoretic.  Psychiatric: He has a normal mood and affect. His behavior is normal.    Assessment/Plan: Right tib/fib fx -- For IMN later this morning by Dr. Doreatha Martin. Please keep NPO. Right medial malleolus fx -- For ORIF at same time Right 5th MT fx -- Will need dedicated foot x-rays. As he will be NWB RLE the MT fx will likely not need additional treatment. Multiple medical problems including DM, HIV, and anxiety/depression    Lisette Abu, PA-C Orthopedic Surgery [phone] 11/01/2017, 8:40 AM

## 2017-11-01 NOTE — H&P (Signed)
History and Physical    Lio Guess SJG:[id number] DOB: 10/04/1985 DOA: 10/31/2017  PCP: Lavinia Sharps, NP   Patient coming from: Home   Chief Complaint: Severe right lower leg pain and deformity  HPI: [name] is a 32 y.o. male with medical history significant for insulin-dependent diabetes mellitus, depression with anxiety, asthma, and HIV, now presenting to the emergency department with severe right lower leg pain and deformity after stepping into a hole and hearing a "pop."  Patient reports he was in his usual state of health when the injury occurred, denies any recent cough or shortness of breath, denies chest pain, denies fevers or chills, and reports continued adherence with his medications.  Describes his pain as severe, localized to the distal right leg, and worse with any movement.  ED Course: Upon arrival to the ED, patient is found to be afebrile, saturating well on room air, and with vitals otherwise stable.  EKG features a sinus or ectopic atrial rhythm.  Chemistry panel is notable for a glucose of 208 and creatinine 1.34, slightly higher than priors.  CBC is unremarkable.  Radiographs of the right lower leg demonstrate comminuted displaced fractures of the mid to distal tibia and fibula, as well as fracture of the proximal fifth metatarsal.  Patient was provided with analgesia in the ED and orthopedic surgery was consulted, recommended medical admission and asking that patient be kept n.p.o.  Review of Systems:  All other systems reviewed and apart from HPI, are negative.  Past Medical History:  Diagnosis Date  . Anxiety   . Asthma    no prior hospitalizations, intubations  . Depression   . Headache    "2-3/day" (05/20/2016)  . HIV (human immunodeficiency virus infection) ([facility])   . Migraine    "a few/month" (05/20/2016)  . Refusal of blood transfusions as patient is Jehovah's Witness   . Type I diabetes mellitus ([facility])     Past Surgical History:  Procedure  Laterality Date  . NO PAST SURGERIES       reports that he has quit smoking. His smoking use included cigarettes. He started smoking about 2 years ago. He has never used smokeless tobacco. He reports that he drinks about 3.0 - 4.0 standard drinks of alcohol per week. He reports that he has current or past drug history. Drug: Marijuana. Frequency: 2.00 times per week.  Allergies  Allergen Reactions  . Shellfish Allergy Anaphylaxis and Hives    Family History  Problem Relation Age of Onset  . Thyroid disease Mother   . Diabetes Mother   . Breast cancer Maternal Grandmother   . Diabetes Maternal Grandmother      Prior to Admission medications   Medication Sig Start Date End Date Taking? Authorizing Provider  acetaminophen (TYLENOL) 500 MG tablet Take 1 tablet (500 mg total) by mouth every 6 (six) hours as needed. 02/10/15   Massie Maroon, FNP  Blood Glucose Monitoring Suppl (TRUE METRIX AIR GLUCOSE METER) DEVI 1 each by Does not apply route 4 (four) times daily -  with meals and at bedtime. 11/12/14   Massie Maroon, FNP  citalopram (CELEXA) 20 MG tablet TAKE 1 TABLET BY MOUTH DAILY 10/02/17   Comer, Belia Heman, MD  elvitegravir-cobicistat-emtricitabine-tenofovir (GENVOYA) 150-150-200-10 MG TABS tablet Take 1 tablet by mouth daily. 06/12/17   Comer, Belia Heman, MD  glucose blood (TRUE METRIX BLOOD GLUCOSE TEST) test strip Use as instructed 11/12/14   Quentin Angst, MD  insulin aspart protamine- aspart (NOVOLOG  MIX 70/30) (70-30) 100 UNIT/ML injection Inject 0.3-0.4 mLs (30-40 Units total) into the skin 2 (two) times daily with a meal. 40 units in the evening 07/04/16   Jeanann Lewandowsky E, MD  Insulin Syringe-Needle U-100 (INSULIN SYRINGE 1CC/31GX5/16") 31G X 5/16" 1 ML MISC Use 3 times per day to inject insulin subcutaneous.  Dx code: 250.01 12/19/12   Linward Headland, MD  TRUEPLUS LANCETS 28G MISC USE AS DIRECTED 11/12/14   Quentin Angst, MD    Physical Exam: Vitals:    10/31/17 2300 10/31/17 2330 11/01/17 0000 11/01/17 0109  BP: (!) 153/98 (!) 159/109 (!) 146/93 (!) 144/101  Pulse: 94 79 93 66  Resp:    18  Temp:    98.9 F (37.2 C)  TempSrc:    Oral  SpO2: 100% 100% 100% 100%      Constitutional: NAD, appears uncomfortable  Eyes: PERTLA, lids and conjunctivae normal ENMT: Mucous membranes are moist. Posterior pharynx clear of any exudate or lesions.   Neck: normal, supple, no masses, no thyromegaly Respiratory: clear to auscultation bilaterally, no wheezing, no crackles. Normal respiratory effort.   Cardiovascular: S1 & S2 heard, regular rate and rhythm. No significant JVD. Abdomen: No distension, no tenderness, soft. Bowel sounds normal.  Musculoskeletal: no clubbing / cyanosis. Right lower leg immobilized in splint, neurovascularly intact distally.    Skin: no significant rashes, lesions, ulcers. Warm, dry, well-perfused. Neurologic: CN 2-12 grossly intact. Sensation intact. Strength 5/5 in all 4 limbs.  Psychiatric: Alert and oriented x 3. Calm, cooperative.     Labs on Admission: I have personally reviewed following labs and imaging studies  CBC: Recent Labs  Lab 10/31/17 2007  WBC 8.4  NEUTROABS 3.8  HGB 14.7  HCT 43.4  MCV 93.7  PLT 254   Basic Metabolic Panel: Recent Labs  Lab 10/31/17 2007  NA 140  K 4.1  CL 105  CO2 24  GLUCOSE 208*  BUN 11  CREATININE 1.34*  CALCIUM 9.1   GFR: CrCl cannot be calculated (Unknown ideal weight.). Liver Function Tests: No results for input(s): AST, ALT, ALKPHOS, BILITOT, PROT, ALBUMIN in the last 168 hours. No results for input(s): LIPASE, AMYLASE in the last 168 hours. No results for input(s): AMMONIA in the last 168 hours. Coagulation Profile: No results for input(s): INR, PROTIME in the last 168 hours. Cardiac Enzymes: No results for input(s): CKTOTAL, CKMB, CKMBINDEX, TROPONINI in the last 168 hours. BNP (last 3 results) No results for input(s): PROBNP in the last 8760  hours. HbA1C: No results for input(s): HGBA1C in the last 72 hours. CBG: Recent Labs  Lab 10/31/17 2019  GLUCAP 186*   Lipid Profile: No results for input(s): CHOL, HDL, LDLCALC, TRIG, CHOLHDL, LDLDIRECT in the last 72 hours. Thyroid Function Tests: No results for input(s): TSH, T4TOTAL, FREET4, T3FREE, THYROIDAB in the last 72 hours. Anemia Panel: No results for input(s): VITAMINB12, FOLATE, FERRITIN, TIBC, IRON, RETICCTPCT in the last 72 hours. Urine analysis:    Component Value Date/Time   COLORURINE YELLOW 05/20/2016 1608   APPEARANCEUR CLEAR 05/20/2016 1608   LABSPEC 1.032 (H) 05/20/2016 1608   PHURINE 5.0 05/20/2016 1608   GLUCOSEU >=500 (A) 05/20/2016 1608   HGBUR SMALL (A) 05/20/2016 1608   BILIRUBINUR NEGATIVE 05/20/2016 1608   KETONESUR NEGATIVE 05/20/2016 1608   PROTEINUR 100 (A) 05/20/2016 1608   UROBILINOGEN 0.2 02/10/2015 1416   NITRITE NEGATIVE 05/20/2016 1608   LEUKOCYTESUR NEGATIVE 05/20/2016 1608   Sepsis Labs: @LABRCNTIP (procalcitonin:4,lacticidven:4) )No results found for  this or any previous visit (from the past 240 hour(s)).   Radiological Exams on Admission: Dg Tibia/fibula Right  Result Date: 10/31/2017 CLINICAL DATA:  Right lower leg pain after stepping in a hole. EXAM: RIGHT TIBIA AND FIBULA - 2 VIEW; RIGHT ANKLE - COMPLETE 3+ VIEW COMPARISON:  None. FINDINGS: Comminuted fracture of the mid to distal tibia diaphysis with 11 mm medial displacement and 16 mm posterior displacement. There is a nondisplaced spiral component which extends into the medial malleolus and tibial plafond. Comminuted fracture of the mid to distal fibular diaphysis with 12 mm medial displacement, 11 mm posterior displacement, and 1.8 cm of overriding fragments. Nondisplaced fracture through the proximal fifth metatarsal. The ankle mortise is symmetric. The talar dome is intact. Small tibiotalar joint effusion. Joint spaces are preserved. Bone mineralization is normal. Diffuse soft  tissue swelling of the lower leg. IMPRESSION: 1. Comminuted, displaced fractures of the mid to distal tibial and fibular diaphyses as described above. The tibial fracture extends into the medial malleolus and tibial plafond. 2. Nondisplaced fracture of the proximal fifth metatarsal. Electronically Signed   By: Obie Dredge M.D.   On: 10/31/2017 20:58   Dg Ankle Complete Right  Result Date: 10/31/2017 CLINICAL DATA:  Right lower leg pain after stepping in a hole. EXAM: RIGHT TIBIA AND FIBULA - 2 VIEW; RIGHT ANKLE - COMPLETE 3+ VIEW COMPARISON:  None. FINDINGS: Comminuted fracture of the mid to distal tibia diaphysis with 11 mm medial displacement and 16 mm posterior displacement. There is a nondisplaced spiral component which extends into the medial malleolus and tibial plafond. Comminuted fracture of the mid to distal fibular diaphysis with 12 mm medial displacement, 11 mm posterior displacement, and 1.8 cm of overriding fragments. Nondisplaced fracture through the proximal fifth metatarsal. The ankle mortise is symmetric. The talar dome is intact. Small tibiotalar joint effusion. Joint spaces are preserved. Bone mineralization is normal. Diffuse soft tissue swelling of the lower leg. IMPRESSION: 1. Comminuted, displaced fractures of the mid to distal tibial and fibular diaphyses as described above. The tibial fracture extends into the medial malleolus and tibial plafond. 2. Nondisplaced fracture of the proximal fifth metatarsal. Electronically Signed   By: Obie Dredge M.D.   On: 10/31/2017 20:58    EKG: Independently reviewed. Sinus or ectopic atrial rhythm.   Assessment/Plan   1. Right tibia and fibula, and 5th metataral fractures  - Presents with severe right lower leg pain and deformity and stepping into a hole and hearing a "pop"  - Radiographs confirm comminuted displaced fractures of mid-distal tibia and fibula  - Orthopedic surgery is consulting and planning for ORIF, requesting a medical  admission  - Continue supportive care with pain-control    2. Insulin-dependent DM  - No recent A1c, was 9.2% in 2016  - Managed at home with Novolog 70/30 30-40 units BID  - Check CBG's, start with Levemir 15 units BID and Novolog correctional while in hospital    3. HIV  - CD4 560 and VL undetectable in April 2019  - Continue Genvoya   4. Depression with anxiety   - Continue Celexa   5. Asthma  - No wheezing or SOB  - Continue prn albuterol     DVT prophylaxis: SCD's  Code Status: Full  Family Communication: Discussed with patient  Consults called: Orthopedic surgery  Admission status: Inpatient     Briscoe Deutscher, MD [facility] Pager [phone]  If 7PM-7AM, please contact night-coverage [email or web address] Password [email or web address]  11/01/2017, 2:14  AM    

## 2017-11-01 NOTE — Progress Notes (Signed)
1525 Received pt back from PACU. Sleepy, responsive to verbal stimuli. RLE with ace wrap dry and intact. Can move toes, warm. Elevated RLE to pillows.

## 2017-11-01 NOTE — Anesthesia Postprocedure Evaluation (Signed)
Anesthesia Post Note  Patient: [name]  Procedure(s) Performed: INTRAMEDULLARY (IM) NAIL TIBIAL (Right Leg Lower) OPEN REDUCTION INTERNAL FIXATION (ORIF) ANKLE FRACTURE (Right Ankle)     Patient location during evaluation: PACU Anesthesia Type: General Level of consciousness: awake and alert Pain management: pain level controlled Vital Signs Assessment: post-procedure vital signs reviewed and stable Respiratory status: spontaneous breathing, nonlabored ventilation, respiratory function stable and patient connected to nasal cannula oxygen Cardiovascular status: blood pressure returned to baseline and stable Postop Assessment: no apparent nausea or vomiting Anesthetic complications: no    Last Vitals:  Vitals:   11/01/17 1449 11/01/17 1527  BP: (!) 165/97 (!) 155/95  Pulse: 97 98  Resp: 20 17  Temp:  36.8 C  SpO2: 99% 96%    Last Pain:  Vitals:   11/01/17 1527  TempSrc: Axillary  PainSc: Asleep                 Dereonna Lensing

## 2017-11-02 ENCOUNTER — Encounter (HOSPITAL_COMMUNITY): Payer: Self-pay | Admitting: Student

## 2017-11-02 DIAGNOSIS — E1040 Type 1 diabetes mellitus with diabetic neuropathy, unspecified: Secondary | ICD-10-CM

## 2017-11-02 DIAGNOSIS — E1065 Type 1 diabetes mellitus with hyperglycemia: Secondary | ICD-10-CM

## 2017-11-02 LAB — CBC WITH DIFFERENTIAL/PLATELET
Abs Immature Granulocytes: 0 10*3/uL (ref 0.0–0.1)
BASOS PCT: 0 %
Basophils Absolute: 0 10*3/uL (ref 0.0–0.1)
EOS ABS: 0 10*3/uL (ref 0.0–0.7)
Eosinophils Relative: 0 %
HCT: 38.7 % — ABNORMAL LOW (ref 39.0–52.0)
Hemoglobin: 13.2 g/dL (ref 13.0–17.0)
IMMATURE GRANULOCYTES: 0 %
Lymphocytes Relative: 18 %
Lymphs Abs: 1.7 10*3/uL (ref 0.7–4.0)
MCH: 31.2 pg (ref 26.0–34.0)
MCHC: 34.1 g/dL (ref 30.0–36.0)
MCV: 91.5 fL (ref 78.0–100.0)
MONOS PCT: 11 %
Monocytes Absolute: 1 10*3/uL (ref 0.1–1.0)
NEUTROS PCT: 71 %
Neutro Abs: 6.8 10*3/uL (ref 1.7–7.7)
Platelets: 225 10*3/uL (ref 150–400)
RBC: 4.23 MIL/uL (ref 4.22–5.81)
RDW: 13.3 % (ref 11.5–15.5)
WBC: 9.6 10*3/uL (ref 4.0–10.5)

## 2017-11-02 LAB — COMPREHENSIVE METABOLIC PANEL
ALT: 17 U/L (ref 0–44)
ANION GAP: 8 (ref 5–15)
AST: 17 U/L (ref 15–41)
Albumin: 3.3 g/dL — ABNORMAL LOW (ref 3.5–5.0)
Alkaline Phosphatase: 46 U/L (ref 38–126)
BUN: 14 mg/dL (ref 6–20)
CHLORIDE: 101 mmol/L (ref 98–111)
CO2: 26 mmol/L (ref 22–32)
Calcium: 8.8 mg/dL — ABNORMAL LOW (ref 8.9–10.3)
Creatinine, Ser: 1.2 mg/dL (ref 0.61–1.24)
GFR calc Af Amer: >60 mL/min (ref >60)
Glucose, Bld: 243 mg/dL — ABNORMAL HIGH (ref 70–99)
POTASSIUM: 4.1 mmol/L (ref 3.5–5.1)
Sodium: 135 mmol/L (ref 135–145)
TOTAL PROTEIN: 6.2 g/dL — AB (ref 6.5–8.1)
Total Bilirubin: 1.1 mg/dL (ref 0.3–1.2)

## 2017-11-02 LAB — GLUCOSE, CAPILLARY
GLUCOSE-CAPILLARY: 248 mg/dL — AB (ref 70–99)
GLUCOSE-CAPILLARY: 103 mg/dL — AB (ref 70–99)
Glucose-Capillary: 197 mg/dL — ABNORMAL HIGH (ref 70–99)
Glucose-Capillary: 273 mg/dL — ABNORMAL HIGH (ref 70–99)
Glucose-Capillary: 223 mg/dL — ABNORMAL HIGH (ref 70–99)
Glucose-Capillary: 137 mg/dL — ABNORMAL HIGH (ref 70–99)

## 2017-11-02 LAB — HEMOGLOBIN A1C
Hgb A1c MFr Bld: 10.2 % — ABNORMAL HIGH (ref 4.8–5.6)
MEAN PLASMA GLUCOSE: 246.04 mg/dL

## 2017-11-02 LAB — MAGNESIUM: MAGNESIUM: 1.9 mg/dL (ref 1.7–2.4)

## 2017-11-02 LAB — TSH: TSH: 0.434 u[IU]/mL (ref 0.350–4.500)

## 2017-11-02 MED ORDER — INSULIN DETEMIR 100 UNIT/ML ~~LOC~~ SOLN
20.0000 [IU] | Freq: Two times a day (BID) | SUBCUTANEOUS | Status: DC
  Administered 2017-11-02 – 2017-11-03 (×2): 20 [IU] via SUBCUTANEOUS
  Filled 2017-11-02 (×3): qty 0.2

## 2017-11-02 MED ORDER — OXYCODONE-ACETAMINOPHEN 5-325 MG PO TABS
2.0000 | ORAL_TABLET | ORAL | Status: DC | PRN
  Administered 2017-11-02 – 2017-11-03 (×4): 2 via ORAL
  Filled 2017-11-02 (×3): qty 2

## 2017-11-02 MED ORDER — INSULIN ASPART 100 UNIT/ML ~~LOC~~ SOLN
3.0000 [IU] | Freq: Three times a day (TID) | SUBCUTANEOUS | Status: DC
  Administered 2017-11-02 – 2017-11-03 (×4): 3 [IU] via SUBCUTANEOUS

## 2017-11-02 MED ORDER — OXYCODONE-ACETAMINOPHEN 5-325 MG PO TABS
1.0000 | ORAL_TABLET | ORAL | Status: DC | PRN
  Filled 2017-11-02 (×2): qty 1

## 2017-11-02 MED ORDER — INSULIN ASPART 100 UNIT/ML ~~LOC~~ SOLN
0.0000 [IU] | Freq: Three times a day (TID) | SUBCUTANEOUS | Status: DC
  Administered 2017-11-02: 3 [IU] via SUBCUTANEOUS
  Administered 2017-11-02: 1 [IU] via SUBCUTANEOUS
  Administered 2017-11-03: 2 [IU] via SUBCUTANEOUS

## 2017-11-02 MED ORDER — ASPIRIN EC 325 MG PO TBEC
325.0000 mg | DELAYED_RELEASE_TABLET | Freq: Every day | ORAL | Status: DC
  Administered 2017-11-02 – 2017-11-03 (×2): 325 mg via ORAL
  Filled 2017-11-02 (×2): qty 1

## 2017-11-02 NOTE — Progress Notes (Signed)
PROGRESS NOTE  [name] ZOX:[id number] DOB: 09/30/1985 DOA: 10/31/2017 PCP: Lavinia Sharps, NP  HPI/Recap of past 24 hours:  POD #1,  C/o pain not well controlled, c/o right foot being numb Blood glucose elevated No fever   Assessment/Plan: Principal Problem:   Closed displaced comminuted fracture of shaft of right tibia Active Problems:   Diabetes mellitus type 1 (HCC)   Asthma   Human immunodeficiency virus (HIV) disease (HCC)   Depression  Right closed tibia fracture Right closed medial malleolus fracture Right 5th metatarsal base fracture  - Presents with severe right lower leg pain and deformity after stepping into a hole and hearing a "pop"  - Right tibial shaft fracture/R medial malleolus fracture s/p IMN/ORIF -Right 5th metatarsal base fracture with splint -  Weightbearing: NWB RLE -VTE prophylaxis: Start ASA 325 mg daily today -ortho input appreciated , Follow - up plan: 2 weeks for suture removal and x-rays  DM1 , likely with peripheral neuropathy He reports he is followed by [facility] [id number] Increase Insulin from 15units bid to 20units bid, add meal coverage, continue ssi Start neurontin qhs diabetes education  HIV CD4 560 and VL undetectable in April 2019  - Continue Genvoya   Asthma  - No wheezing or SOB  - Continue prn albuterol    Depression with anxiety   - Continue Celexa   Patient reports has no insurance, no pcp Family request social worker to help  Code Status: full  Family Communication: patient   Disposition Plan: likely d/c on 9/6   Consultants:  Ortho Dr Jena Gauss  Procedures: on 9/4 by Dr Jena Gauss 1. CPT 27759-Intramedullary nailing of right tibia 2. CPT 27766-Open reduction internal fixation of right medial malleolus 3. CPT 28475-Closed treatment of right 5th metatarsal base fracture   Antibiotics:  perioperative   Objective: BP (!) 145/97 (BP Location: Right Arm)   Pulse 87   Temp 99.2 F (37.3 C) (Oral)   Resp  17   SpO2 100%   Intake/Output Summary (Last 24 hours) at 11/02/2017 0945 Last data filed at 11/02/2017 0500 Gross per 24 hour  Intake 1400 ml  Output 975 ml  Net 425 ml   There were no vitals filed for this visit.  Exam: Patient is examined daily including today on 11/02/2017, exams remain the same as of yesterday except that has changed    General:  NAD  Cardiovascular: RRR  Respiratory: CTABL  Abdomen: Soft/ND/NT, positive BS  Musculoskeletal: right lower extremity post op changes  Neuro: alert, oriented   Data Reviewed: Basic Metabolic Panel: Recent Labs  Lab 10/31/17 2007 11/01/17 0400 11/02/17 0440  NA 140 139 135  K 4.1 4.3 4.1  CL 105 103 101  CO2 24 25 26   GLUCOSE 208* 242* 243*  BUN 11 11 14   CREATININE 1.34* 1.03 1.20  CALCIUM 9.1 8.8* 8.8*  MG  --   --  1.9   Liver Function Tests: Recent Labs  Lab 11/02/17 0440  AST 17  ALT 17  ALKPHOS 46  BILITOT 1.1  PROT 6.2*  ALBUMIN 3.3*   No results for input(s): LIPASE, AMYLASE in the last 168 hours. No results for input(s): AMMONIA in the last 168 hours. CBC: Recent Labs  Lab 10/31/17 2007 11/01/17 0400 11/02/17 0440  WBC 8.4 10.7* 9.6  NEUTROABS 3.8  --  6.8  HGB 14.7 14.0 13.2  HCT 43.4 40.9 38.7*  MCV 93.7 91.9 91.5  PLT 254 249 225   Cardiac Enzymes:  No results for input(s): CKTOTAL, CKMB, CKMBINDEX, TROPONINI in the last 168 hours. BNP (last 3 results) No results for input(s): BNP in the last 8760 hours.  ProBNP (last 3 results) No results for input(s): PROBNP in the last 8760 hours.  CBG: Recent Labs  Lab 11/01/17 1818 11/01/17 2059 11/02/17 0106 11/02/17 0439 11/02/17 0852  GLUCAP 214* 237* 248* 197* 273*    Recent Results (from the past 240 hour(s))  MRSA PCR Screening     Status: None   Collection Time: 11/01/17  4:33 AM  Result Value Ref Range Status   MRSA by PCR NEGATIVE NEGATIVE Final    Comment:        The GeneXpert MRSA Assay (FDA approved for NASAL  specimens only), is one component of a comprehensive MRSA colonization surveillance program. It is not intended to diagnose MRSA infection nor to guide or monitor treatment for MRSA infections. Performed at [facility], [address], [place], [place] [address]   Surgical pcr screen     Status: None   Collection Time: 11/01/17  4:33 AM  Result Value Ref Range Status   MRSA, PCR NEGATIVE NEGATIVE Final   Staphylococcus aureus NEGATIVE NEGATIVE Final    Comment: (NOTE) The Xpert SA Assay (FDA approved for NASAL specimens in patients 42 years of age and older), is one component of a comprehensive surveillance program. It is not intended to diagnose infection nor to guide or monitor treatment. Performed at [facility], [address], [place], [place] [address]      Studies: Dg Tibia/fibula Right  Result Date: 11/01/2017 CLINICAL DATA:  Tibial nail with distal tibial plate EXAM: DG C-ARM 61-120 MIN; RIGHT TIBIA AND FIBULA - 2 VIEW COMPARISON:  CT 11/01/2017, radiograph 10/31/2017 FINDINGS: Eleven low resolution intraoperative spot views of the right tibia and fibula. Total fluoroscopy time was 3 minutes 4 seconds. Initial images demonstrate comminuted fractures involving the mid to distal shafts of the tibia and fibula with fracture at the medial malleolus. Subsequent images demonstrate intramedullary rod and proximal and distal screw fixation of the tibia. Additional plate and multiple screw fixation of the distal tibia. Decreased displacement of comminuted fracture fragments involving the mid to distal shaft of the tibia. Comminuted distal fibular shaft fracture with residual displacement. IMPRESSION: Intraoperative fluoroscopic assistance provided during surgical fixation of tibial fractures. Electronically Signed   By: Jasmine Pang M.D.   On: 11/01/2017 14:29   Dg Tibia/fibula Right Port  Result Date: 11/01/2017 CLINICAL DATA:  ORIF of right tibial fractures. EXAM:  PORTABLE RIGHT TIBIA AND FIBULA - 2 VIEW COMPARISON:  10/31/2017 FINDINGS: An intramedullary rod is been placed extending from the proximal metaphysis to the distal metaphysis. It spans the spiral comminuted fracture of the mid to distal tibial shaft. There has been a significant reduction in fracture displacement, with no significant residual angulation. The orthopedic hardware appears well seated and aligned. The distal fibular fracture is less displaced than on the pre reduction images, with significant improvement in angulation. There is a subtle transverse nondisplaced fracture of the distal fibula at the metadiaphysis. Knee and ankle joints are normally aligned. IMPRESSION: 1. Significant reduction of the tibial shaft fracture following ORIF. Electronically Signed   By: Amie Portland M.D.   On: 11/01/2017 15:26   Dg Ankle Right Port  Result Date: 11/01/2017 CLINICAL DATA:  ORIF right tibia and fibular fractures EXAM: PORTABLE RIGHT ANKLE - 2 VIEW COMPARISON:  10/31/2017 FINDINGS: Ankylosis showed the distal aspect  of the intramedullary rod which crosses the comminuted fracture of tibial shaft. The rod appears well-seated. There is a medial fixation plate across the distal tibia from the metadiaphysis to the medial malleolus, with associated fixation screws, fixating the medial malleolar fracture into anatomic alignment. The fibular shaft fracture is unchanged from the previous day's study. Ankle joint is normally aligned. No new fractures or evidence of an operative complication. IMPRESSION: 1. Well-aligned fracture the medial malleolus following ORIF. Electronically Signed   By: Amie Portland M.D.   On: 11/01/2017 15:22   Dg Foot Complete Right  Result Date: 11/01/2017 CLINICAL DATA:  ORIF of the right leg. EXAM: RIGHT FOOT COMPLETE - 3+ VIEW COMPARISON:  10/31/2017 FINDINGS: Distal aspect of the ORIF hardware along the distal tibia is noted, well-seated. Subtle nondisplaced fractures noted of the  distal fibula at the metadiaphysis. There is a comminuted fracture of the fifth metatarsal, across the metaphysis extending to the articular surface at the cuboid metatarsal articulation. No other foot fractures. Joints are normally spaced and aligned. IMPRESSION: 1. ORIF of tibial fractures. 2. Nondisplaced fracture of the distal fibula at the metadiaphysis. 3. Comminuted fracture of the proximal fifth metatarsal. Electronically Signed   By: Amie Portland M.D.   On: 11/01/2017 15:24   Dg C-arm 1-60 Min  Result Date: 11/01/2017 CLINICAL DATA:  Tibial nail with distal tibial plate EXAM: DG C-ARM 61-120 MIN; RIGHT TIBIA AND FIBULA - 2 VIEW COMPARISON:  CT 11/01/2017, radiograph 10/31/2017 FINDINGS: Eleven low resolution intraoperative spot views of the right tibia and fibula. Total fluoroscopy time was 3 minutes 4 seconds. Initial images demonstrate comminuted fractures involving the mid to distal shafts of the tibia and fibula with fracture at the medial malleolus. Subsequent images demonstrate intramedullary rod and proximal and distal screw fixation of the tibia. Additional plate and multiple screw fixation of the distal tibia. Decreased displacement of comminuted fracture fragments involving the mid to distal shaft of the tibia. Comminuted distal fibular shaft fracture with residual displacement. IMPRESSION: Intraoperative fluoroscopic assistance provided during surgical fixation of tibial fractures. Electronically Signed   By: Jasmine Pang M.D.   On: 11/01/2017 14:29   Dg C-arm 1-60 Min  Result Date: 11/01/2017 CLINICAL DATA:  Tibial nail with distal tibial plate EXAM: DG C-ARM 61-120 MIN; RIGHT TIBIA AND FIBULA - 2 VIEW COMPARISON:  CT 11/01/2017, radiograph 10/31/2017 FINDINGS: Eleven low resolution intraoperative spot views of the right tibia and fibula. Total fluoroscopy time was 3 minutes 4 seconds. Initial images demonstrate comminuted fractures involving the mid to distal shafts of the tibia and  fibula with fracture at the medial malleolus. Subsequent images demonstrate intramedullary rod and proximal and distal screw fixation of the tibia. Additional plate and multiple screw fixation of the distal tibia. Decreased displacement of comminuted fracture fragments involving the mid to distal shaft of the tibia. Comminuted distal fibular shaft fracture with residual displacement. IMPRESSION: Intraoperative fluoroscopic assistance provided during surgical fixation of tibial fractures. Electronically Signed   By: Jasmine Pang M.D.   On: 11/01/2017 14:29    Scheduled Meds: . aspirin EC  325 mg Oral Daily  . citalopram  20 mg Oral Daily  . docusate sodium  100 mg Oral BID  . elvitegravir-cobicistat-emtricitabine-tenofovir  1 tablet Oral Daily  . gabapentin  300 mg Oral QHS  . Influenza vac split quadrivalent PF  0.5 mL Intramuscular Tomorrow-1000  . insulin aspart  0-9 Units Subcutaneous Q4H  . insulin aspart  3 Units Subcutaneous TID WC  .  insulin detemir  20 Units Subcutaneous BID  . ketorolac  15 mg Intravenous Q6H  . methocarbamol  1,000 mg Oral Q8H    Continuous Infusions: .  ceFAZolin (ANCEF) IV 2 g (11/02/17 0447)  . methocarbamol (ROBAXIN) IV 1,000 mg (11/01/17 0612)     Time spent: I have personally reviewed and interpreted on  11/02/2017 daily labs, imagings as discussed above under date review session and assessment and plans.  I reviewed all nursing notes, pharmacy notes, consultant notes,  vitals, pertinent old records  I have discussed plan of care as described above with RN , patient on 11/02/2017   Albertine Grates MD, PhD  [facility] Pager [phone]. If 7PM-7AM, please contact night-coverage at [email or web address], password [facility] 11/02/2017, 9:45 AM  LOS: 2 days

## 2017-11-02 NOTE — Evaluation (Signed)
Physical Therapy Evaluation Patient Details Name: [name] MRN: [id number] DOB: [birth year] Today's Date: 11/02/2017   History of Present Illness  Pt is a 32 y/o male s/p IM nail R tibia and ORIF R medial maleolus secondary to sustaining injuries with a fall. PMH including but not limited to DM, HIV and anxiety.    Clinical Impression  Pt presented supine in bed with HOB elevated, awake and willing to participate in therapy session. Prior to admission, pt reported that he was independent with all functional mobility and ADLs. Pt lives alone and stated that he could potentially have assistance intermittently upon d/c. Pt currently very limited secondary to pain, fatigue and lethargy. He tolerated bed mobility and transfers with min assist overall. He was unable to take any steps with RW and assist at this time. Pt would continue to benefit from skilled physical therapy services at this time while admitted and after d/c to address the below listed limitations in order to improve overall safety and independence with functional mobility.     Follow Up Recommendations Home health PT;Supervision/Assistance - 24 hour    Equipment Recommendations  Wheelchair (measurements PT);Wheelchair cushion (measurements PT);Rolling walker with 5" wheels;Other (comment)(w/c with elevating leg rests)    Recommendations for Other Services       Precautions / Restrictions Precautions Precautions: Fall Restrictions Weight Bearing Restrictions: Yes RLE Weight Bearing: Non weight bearing      Mobility  Bed Mobility Overal bed mobility: Needs Assistance Bed Mobility: Supine to Sit     Supine to sit: Min assist     General bed mobility comments: increased time, min A for R LE movement off of bed  Transfers Overall transfer level: Needs assistance Equipment used: Rolling walker (2 wheeled) Transfers: Sit to/from [employer] Sit to Stand: Min assist Stand pivot transfers: Min  assist       General transfer comment: increased time and effort, assist for stability with transitional movements, cueing for safe hand placement and technique  Ambulation/Gait             General Gait Details: pt unable to take any hops on his L LE with RW at this time, limited secondary to pain, fatigue and lethargy  Stairs            Wheelchair Mobility    Modified Rankin (Stroke Patients Only)       Balance Overall balance assessment: Needs assistance Sitting-balance support: Feet supported Sitting balance-Leahy Scale: Good     Standing balance support: During functional activity;Bilateral upper extremity supported Standing balance-Leahy Scale: Poor                               Pertinent Vitals/Pain Pain Assessment: Faces Faces Pain Scale: Hurts even more Pain Location: R LE Pain Descriptors / Indicators: Sore;Grimacing;Guarding Pain Intervention(s): Monitored during session;Repositioned    Home Living Family/patient expects to be discharged to:: Private residence Living Arrangements: Alone Available Help at Discharge: Friend(s);Family;Available PRN/intermittently Type of Home: House Home Access: Level entry     Home Layout: One level Home Equipment: None      Prior Function Level of Independence: Independent               Hand Dominance        Extremity/Trunk Assessment   Upper Extremity Assessment Upper Extremity Assessment: Overall WFL for tasks assessed;Defer to OT evaluation    Lower Extremity Assessment Lower Extremity Assessment: LLE deficits/detail  LLE Deficits / Details: pt with decreased strength and ROM limitations secondary to post-op pain and weakness. Pt with diminished sensation to light touch of all toes LLE: Unable to fully assess due to immobilization;Unable to fully assess due to pain    Cervical / Trunk Assessment Cervical / Trunk Assessment: Normal  Communication   Communication: No  difficulties  Cognition Arousal/Alertness: Lethargic Behavior During Therapy: WFL for tasks assessed/performed Overall Cognitive Status: Within Functional Limits for tasks assessed                                        General Comments      Exercises     Assessment/Plan    PT Assessment Patient needs continued PT services  PT Problem List Decreased strength;Decreased range of motion;Decreased activity tolerance;Decreased balance;Decreased mobility;Decreased coordination;Decreased safety awareness;Decreased knowledge of use of DME;Decreased knowledge of precautions;Pain       PT Treatment Interventions DME instruction;Gait training;Stair training;Functional mobility training;Therapeutic activities;Balance training;Therapeutic exercise;Neuromuscular re-education;Patient/family education    PT Goals (Current goals can be found in the Care Plan section)  Acute Rehab PT Goals Patient Stated Goal: decrease pain PT Goal Formulation: With patient Time For Goal Achievement: 11/16/17 Potential to Achieve Goals: Good    Frequency Min 5X/week   Barriers to discharge        Co-evaluation               AM-PAC PT "6 Clicks" Daily Activity  Outcome Measure Difficulty turning over in bed (including adjusting bedclothes, sheets and blankets)?: Unable Difficulty moving from lying on back to sitting on the side of the bed? : Unable Difficulty sitting down on and standing up from a chair with arms (e.g., wheelchair, bedside commode, etc,.)?: Unable Help needed moving to and from a bed to chair (including a wheelchair)?: A Little Help needed walking in hospital room?: Total Help needed climbing 3-5 steps with a railing? : Total 6 Click Score: 8    End of Session Equipment Utilized During Treatment: Gait belt Activity Tolerance: Patient limited by pain;Patient limited by lethargy;Patient limited by fatigue Patient left: in chair;with call bell/phone within  reach Nurse Communication: Mobility status PT Visit Diagnosis: Other abnormalities of gait and mobility (R26.89)    Time: 3664-4034 PT Time Calculation (min) (ACUTE ONLY): 16 min   Charges:   PT Evaluation $PT Eval Moderate Complexity: 1 Mod          Deborah Chalk, PT, DPT  Acute Rehabilitation Services Pager [phone] Office [phone]    Alessandra Bevels [place] 11/02/2017, 1:59 PM

## 2017-11-02 NOTE — Progress Notes (Signed)
Orthopaedic Trauma Progress Note  S: Doing okay.  States that he got knocked in his leg last evening and has been having pain ever since.  No other significant issues of note.  Denies any chest pain or shortness of breath.  O:  Vitals:   11/02/17 0208 11/02/17 0419  BP: (!) 136/95 (!) 140/95  Pulse: 74 73  Resp: 15 17  Temp: (!) 97.5 F (36.4 C) 98.7 F (37.1 C)  SpO2:  99%    Right lower extremity: Dressing is clean dry and intact.  He is active EHL and FHL.  Endorses sensation of the dorsum and plantar aspect of his foot.  He has brisk cap refill.  Imaging: Stable postop imaging  Labs:  Results for orders placed or performed during the hospital encounter of 10/31/17 (from the past 24 hour(s))  Glucose, capillary     Status: Abnormal   Collection Time: 11/01/17 10:10 AM  Result Value Ref Range   Glucose-Capillary 166 (H) 70 - 99 mg/dL  Glucose, capillary     Status: Abnormal   Collection Time: 11/01/17  2:01 PM  Result Value Ref Range   Glucose-Capillary 138 (H) 70 - 99 mg/dL  Glucose, capillary     Status: Abnormal   Collection Time: 11/01/17  4:23 PM  Result Value Ref Range   Glucose-Capillary 204 (H) 70 - 99 mg/dL  Glucose, capillary     Status: Abnormal   Collection Time: 11/01/17  6:18 PM  Result Value Ref Range   Glucose-Capillary 214 (H) 70 - 99 mg/dL  Glucose, capillary     Status: Abnormal   Collection Time: 11/01/17  8:59 PM  Result Value Ref Range   Glucose-Capillary 237 (H) 70 - 99 mg/dL  Glucose, capillary     Status: Abnormal   Collection Time: 11/02/17  1:06 AM  Result Value Ref Range   Glucose-Capillary 248 (H) 70 - 99 mg/dL  Glucose, capillary     Status: Abnormal   Collection Time: 11/02/17  4:39 AM  Result Value Ref Range   Glucose-Capillary 197 (H) 70 - 99 mg/dL  CBC with Differential/Platelet     Status: Abnormal   Collection Time: 11/02/17  4:40 AM  Result Value Ref Range   WBC 9.6 4.0 - 10.5 K/uL   RBC 4.23 4.22 - 5.81 MIL/uL   Hemoglobin  13.2 13.0 - 17.0 g/dL   HCT 87.5 (L) 64.3 - 32.9 %   MCV 91.5 78.0 - 100.0 fL   MCH 31.2 26.0 - 34.0 pg   MCHC 34.1 30.0 - 36.0 g/dL   RDW 51.8 84.1 - 66.0 %   Platelets 225 150 - 400 K/uL   Neutrophils Relative % 71 %   Neutro Abs 6.8 1.7 - 7.7 K/uL   Lymphocytes Relative 18 %   Lymphs Abs 1.7 0.7 - 4.0 K/uL   Monocytes Relative 11 %   Monocytes Absolute 1.0 0.1 - 1.0 K/uL   Eosinophils Relative 0 %   Eosinophils Absolute 0.0 0.0 - 0.7 K/uL   Basophils Relative 0 %   Basophils Absolute 0.0 0.0 - 0.1 K/uL   Immature Granulocytes 0 %   Abs Immature Granulocytes 0.0 0.0 - 0.1 K/uL  Comprehensive metabolic panel     Status: Abnormal   Collection Time: 11/02/17  4:40 AM  Result Value Ref Range   Sodium 135 135 - 145 mmol/L   Potassium 4.1 3.5 - 5.1 mmol/L   Chloride 101 98 - 111 mmol/L   CO2 26 22 -  32 mmol/L   Glucose, Bld 243 (H) 70 - 99 mg/dL   BUN 14 6 - 20 mg/dL   Creatinine, Ser 1.61 0.61 - 1.24 mg/dL   Calcium 8.8 (L) 8.9 - 10.3 mg/dL   Total Protein 6.2 (L) 6.5 - 8.1 g/dL   Albumin 3.3 (L) 3.5 - 5.0 g/dL   AST 17 15 - 41 U/L   ALT 17 0 - 44 U/L   Alkaline Phosphatase 46 38 - 126 U/L   Total Bilirubin 1.1 0.3 - 1.2 mg/dL   GFR calc non Af Amer >60 >60 mL/min   GFR calc Af Amer >60 >60 mL/min   Anion gap 8 5 - 15  Magnesium     Status: None   Collection Time: 11/02/17  4:40 AM  Result Value Ref Range   Magnesium 1.9 1.7 - 2.4 mg/dL  Hemoglobin W9U     Status: Abnormal   Collection Time: 11/02/17  4:40 AM  Result Value Ref Range   Hgb A1c MFr Bld 10.2 (H) 4.8 - 5.6 %   Mean Plasma Glucose 246.04 mg/dL  TSH     Status: None   Collection Time: 11/02/17  4:40 AM  Result Value Ref Range   TSH 0.434 0.350 - 4.500 uIU/mL    Assessment: 32 year old male s/p fall  Injuries: 1. Right tibial shaft fracture/R medial malleolus fracture s/p IMN/ORIF 2. Right 5th metatarsal base fracture  Weightbearing: NWB RLE  Insicional and dressing care: Splint clean, dry and  intact  Orthopedic device(s):Splint  CV/Blood loss:Hgb stable this AM at 13.2  Pain management: 1.  Adjusted to Percocet 1-2 every 4 hours 2. Dilaudid 1mg  q 2hrs PRN 3. Robaxin 1000mg  q 8 hrs PRN  VTE prophylaxis: Start ASA 325 mg daily today  ID: Ancef 24 hours postoperatively  Foley/Lines: KVO IVF  Medical co-morbidities: 1. T1DM-levemir 15mg  BID and sliding scale insulin, blood glucose has been elevated overnight and since admission. Will increase risk of postoperative complications and nonunion with elevation of sugars 2. HIV-Stable, on home meds  Impediments to Fracture Healing: HIV and diabetes  Dispo: PT/OT eval, likely discharge home today vs tomorrow  Follow - up plan: 2 weeks for suture removal and x-rays   Roby Lofts, MD [facility] [phone] (phone)

## 2017-11-02 NOTE — Progress Notes (Signed)
Explanation of Necessity for Wheelchair  Patient is s/p R tibia IM nail and ORIF R ankle which impairs their ability to perform daily activities in the home.  A walker alone will not resolve the issues with performing activities of daily living. A wheelchair will allow patient to safely perform daily activities.  The patient can self propel in the home or has a caregiver who can provide assistance.     Deborah Chalk, [place], DPT  Acute Rehabilitation Services Pager [phone] Office [phone]

## 2017-11-02 NOTE — Progress Notes (Signed)
Inpatient Diabetes Program Recommendations  AACE/ADA: New Consensus Statement on Inpatient Glycemic Control (2015)  Target Ranges:  Prepandial:   less than 140 mg/dL      Peak postprandial:   less than 180 mg/dL (1-2 hours)      Critically ill patients:  140 - 180 mg/dL   Lab Results  Component Value Date   GLUCAP 273 (H) 11/02/2017   HGBA1C 10.2 (H) 11/02/2017    Review of Glycemic Control  Diabetes history: DM 1, Diagnosed at age 32/4 Outpatient Diabetes medications: 70/30 53 units in the am, 36 units qpm Current orders for Inpatient glycemic control: Levemir 20 units BID, Novolog 0-9 units tid, Novolog 3 units tid meal coverage  A1c 10.2%  Inpatient Diabetes Program Recommendations:    [facility] and has an appointment with Ms. Clerance Lav at the clinic this month. He has already had his visit for blood work and his A1c was a little over 10 at that time. He has been getting insulin for [employer] 70/30.  Patient has been in and out of a homeless shelter ad DM really affects how he is able to work some days. He needs steady income to afford his doctor appointments  70/30 insulin cost $25 vial/week $125-150/month  Patient tried applying for medicaid in the past and didn't qualified because he still worked. He had to have income due to doctor visit copays.   Patient has sporadic glucose control based on what food is served at the shelters and sometimes he has to titrate his insulin to keep from having a low glucose.  Patient said his new meter was taken along with a few syringes at the shelter. Discussed [employer] meters and strips and the cost of syringes. Told patient to make sure he is able to go to his appointment with Clerance Lav.  Patient is non weight bearing which will effect his ability to work and have income.   Thanks,  Christena Deem RN, MSN, BC-ADM Inpatient Diabetes Coordinator Team Pager [phone] (8a-5p)

## 2017-11-02 NOTE — Evaluation (Signed)
Occupational Therapy Evaluation Patient Details Name: [name] MRN: [id number] DOB: 11-Aug-1985 Today's Date: 11/02/2017    History of Present Illness Pt is a 32 y/o male s/p IM nail R tibia and ORIF R medial maleolus secondary to sustaining injuries with a fall. PMH including but not limited to DM, HIV and anxiety.   Clinical Impression   Pt was independent prior to admission. Presents with lethargy, generalized weakness and poor standing balance. Pt currently requires set up to max assist for ADL and min assist for pivot transfer with RW. Will follow acutely. Pt has limited assistance at home and needs to function at a modified independent level.     Follow Up Recommendations  Home health OT    Equipment Recommendations  3 in 1 bedside commode;Wheelchair (measurements OT);Wheelchair cushion (measurements OT)    Recommendations for Other Services       Precautions / Restrictions Precautions Precautions: Fall Restrictions Weight Bearing Restrictions: Yes RLE Weight Bearing: Non weight bearing      Mobility Bed Mobility      General bed mobility comments: pt received in chair  Transfers Overall transfer level: Needs assistance Equipment used: Rolling walker (2 wheeled) Transfers: Sit to/from [employer] Sit to Stand: Min assist Stand pivot transfers: Min assist       General transfer comment: increased time and effort, assist for stability with transitional movements, cueing for safe hand placement and technique    Balance Overall balance assessment: Needs assistance Sitting-balance support: Feet supported Sitting balance-Leahy Scale: Good     Standing balance support: During functional activity;Bilateral upper extremity supported Standing balance-Leahy Scale: Poor Standing balance comment: unable to release walker in static standing                           ADL either performed or assessed with clinical judgement   ADL Overall  ADL's : Needs assistance/impaired Eating/Feeding: Independent;Sitting   Grooming: Wash/dry hands;Wash/dry face;Sitting;Set up   Upper Body Bathing: Set up;Sitting   Lower Body Bathing: Moderate assistance;Sit to/from stand   Upper Body Dressing : Set up;Sitting   Lower Body Dressing: Maximal assistance;Sit to/from stand   Toilet Transfer: Minimal assistance;Stand-pivot;BSC;RW   Toileting- Clothing Manipulation and Hygiene: Moderate assistance;Sit to/from stand               Vision Patient Visual Report: No change from baseline       Perception     Praxis      Pertinent Vitals/Pain Pain Assessment: Faces Faces Pain Scale: Hurts little more Pain Location: R LE Pain Descriptors / Indicators: Sore;Grimacing;Guarding Pain Intervention(s): Monitored during session;Premedicated before session;Repositioned     Hand Dominance Right   Extremity/Trunk Assessment Upper Extremity Assessment Upper Extremity Assessment: Overall WFL for tasks assessed   Lower Extremity Assessment Lower Extremity Assessment: Defer to PT evaluation LLE Deficits / Details: pt with decreased strength and ROM limitations secondary to post-op pain and weakness. Pt with diminished sensation to light touch of all toes LLE: Unable to fully assess due to immobilization;Unable to fully assess due to pain   Cervical / Trunk Assessment Cervical / Trunk Assessment: Normal   Communication Communication Communication: No difficulties   Cognition Arousal/Alertness: Lethargic Behavior During Therapy: WFL for tasks assessed/performed Overall Cognitive Status: Within Functional Limits for tasks assessed  General Comments       Exercises     Shoulder Instructions      Home Living Family/patient expects to be discharged to:: Private residence Living Arrangements: Alone Available Help at Discharge: Friend(s);Family;Available PRN/intermittently Type  of Home: House Home Access: Level entry     Home Layout: One level     Bathroom Shower/Tub: Chief Strategy Officer: Standard     Home Equipment: None          Prior Functioning/Environment Level of Independence: Independent                 OT Problem List: Impaired balance (sitting and/or standing);Decreased knowledge of use of DME or AE;Pain      OT Treatment/Interventions: Self-care/ADL training;DME and/or AE instruction;Therapeutic activities;Balance training;Patient/family education    OT Goals(Current goals can be found in the care plan section) Acute Rehab OT Goals Patient Stated Goal: decrease pain OT Goal Formulation: With patient Time For Goal Achievement: 11/16/17 Potential to Achieve Goals: Good ADL Goals Pt Will Perform Grooming: (P) with modified independence;sitting(at sink) Pt Will Perform Lower Body Bathing: (P) with modified independence;sit to/from stand;with adaptive equipment;sitting/lateral leans Pt Will Perform Lower Body Dressing: (P) with modified independence;with adaptive equipment;sit to/from stand;sitting/lateral leans Pt Will Transfer to Toilet: (P) with modified independence;ambulating Pt Will Perform Toileting - Clothing Manipulation and hygiene: (P) with modified independence;sit to/from stand;sitting/lateral leans Pt Will Perform Tub/Shower Transfer: (P) with modified independence;ambulating;3 in 1;rolling walker  OT Frequency: Min 2X/week   Barriers to D/C:            Co-evaluation              AM-PAC PT "6 Clicks" Daily Activity     Outcome Measure Help from another person eating meals?: None Help from another person taking care of personal grooming?: A Little Help from another person toileting, which includes using toliet, bedpan, or urinal?: A Little Help from another person bathing (including washing, rinsing, drying)?: A Lot Help from another person to put on and taking off regular upper body clothing?:  None Help from another person to put on and taking off regular lower body clothing?: A Lot 6 Click Score: 18   End of Session Equipment Utilized During Treatment: Gait belt;Rolling walker  Activity Tolerance: Patient limited by lethargy Patient left: in chair;with call bell/phone within reach  OT Visit Diagnosis: Unsteadiness on feet (R26.81);Other abnormalities of gait and mobility (R26.89);Pain                Time: 1450-1505 OT Time Calculation (min): 15 min Charges:  OT General Charges $OT Visit: 1 Visit OT Evaluation $OT Eval Moderate Complexity: 1 Mod  11/02/2017 Martie Round, OTR/L Pager: [phone]  Iran Planas, [place] Bailiff 11/02/2017, 3:34 PM

## 2017-11-03 DIAGNOSIS — E1021 Type 1 diabetes mellitus with diabetic nephropathy: Secondary | ICD-10-CM

## 2017-11-03 DIAGNOSIS — S82401A Unspecified fracture of shaft of right fibula, initial encounter for closed fracture: Secondary | ICD-10-CM

## 2017-11-03 DIAGNOSIS — S92351A Displaced fracture of fifth metatarsal bone, right foot, initial encounter for closed fracture: Secondary | ICD-10-CM

## 2017-11-03 DIAGNOSIS — S82201A Unspecified fracture of shaft of right tibia, initial encounter for closed fracture: Secondary | ICD-10-CM

## 2017-11-03 DIAGNOSIS — W19XXXA Unspecified fall, initial encounter: Secondary | ICD-10-CM

## 2017-11-03 DIAGNOSIS — B20 Human immunodeficiency virus [HIV] disease: Secondary | ICD-10-CM

## 2017-11-03 DIAGNOSIS — S82251A Displaced comminuted fracture of shaft of right tibia, initial encounter for closed fracture: Principal | ICD-10-CM

## 2017-11-03 LAB — GLUCOSE, CAPILLARY
Glucose-Capillary: 158 mg/dL — ABNORMAL HIGH (ref 70–99)
Glucose-Capillary: 82 mg/dL (ref 70–99)
Glucose-Capillary: 147 mg/dL — ABNORMAL HIGH (ref 70–99)
Glucose-Capillary: 168 mg/dL — ABNORMAL HIGH (ref 70–99)

## 2017-11-03 LAB — BASIC METABOLIC PANEL
ANION GAP: 8 (ref 5–15)
BUN: 13 mg/dL (ref 6–20)
CALCIUM: 8.9 mg/dL (ref 8.9–10.3)
CHLORIDE: 101 mmol/L (ref 98–111)
CO2: 27 mmol/L (ref 22–32)
Creatinine, Ser: 1.08 mg/dL (ref 0.61–1.24)
GFR calc Af Amer: >60 mL/min (ref >60)
GFR calc non Af Amer: >60 mL/min (ref >60)
GLUCOSE: 128 mg/dL — AB (ref 70–99)
Potassium: 3.8 mmol/L (ref 3.5–5.1)
Sodium: 136 mmol/L (ref 135–145)

## 2017-11-03 LAB — CBC
HEMATOCRIT: 36.8 % — AB (ref 39.0–52.0)
Hemoglobin: 12.4 g/dL — ABNORMAL LOW (ref 13.0–17.0)
MCH: 31.2 pg (ref 26.0–34.0)
MCHC: 33.7 g/dL (ref 30.0–36.0)
MCV: 92.7 fL (ref 78.0–100.0)
Platelets: 197 10*3/uL (ref 150–400)
RBC: 3.97 MIL/uL — ABNORMAL LOW (ref 4.22–5.81)
RDW: 13.2 % (ref 11.5–15.5)
WBC: 7.8 10*3/uL (ref 4.0–10.5)

## 2017-11-03 MED ORDER — ASPIRIN 325 MG PO TBEC: 325.0000 mg | DELAYED_RELEASE_TABLET | Freq: Every day | ORAL | 0 refills | Status: DC

## 2017-11-03 MED ORDER — GABAPENTIN 300 MG PO CAPS: 300.0000 mg | ORAL_CAPSULE | Freq: Every day | ORAL | 0 refills | Status: DC

## 2017-11-03 MED ORDER — OXYCODONE-ACETAMINOPHEN 5-325 MG PO TABS: 2.0000 | ORAL_TABLET | Freq: Three times a day (TID) | ORAL | 0 refills | Status: DC | PRN

## 2017-11-03 MED ORDER — OXYCODONE-ACETAMINOPHEN 5-325 MG PO TABS: 1.0000 | ORAL_TABLET | Freq: Three times a day (TID) | ORAL | 0 refills | Status: AC | PRN

## 2017-11-03 MED ORDER — GLUCOSE BLOOD VI STRP: ORAL_STRIP | 12 refills | Status: DC

## 2017-11-03 MED ORDER — INSULIN ASPART PROT & ASPART (70-30 MIX) 100 UNIT/ML ~~LOC~~ SUSP: 30.0000 [IU] | Freq: Two times a day (BID) | SUBCUTANEOUS | 0 refills | Status: DC

## 2017-11-03 MED ORDER — POLYETHYLENE GLYCOL 3350 17 G PO PACK: 17.0000 g | PACK | Freq: Every day | ORAL | 0 refills | Status: DC

## 2017-11-03 MED ORDER — POLYETHYLENE GLYCOL 3350 17 G PO PACK: 17.0000 g | PACK | Freq: Every day | ORAL | Status: DC

## 2017-11-03 MED ORDER — SENNOSIDES-DOCUSATE SODIUM 8.6-50 MG PO TABS: 1.0000 | ORAL_TABLET | Freq: Two times a day (BID) | ORAL | Status: DC

## 2017-11-03 NOTE — Discharge Summary (Signed)
Discharge Summary  [name] IYM:[id number] DOB: 11/17/1985  PCP: Lavinia Sharps, NP  Admit date: 10/31/2017 Discharge date: 11/03/2017  Time spent: , more than 50% time spent on coordination of care.  Recommendations for Outpatient Follow-up:  1. F/u with PMD within a week  for hospital discharge follow up, repeat cbc/bmp at follow up 2. F/u with orthopedics Dr Jena Gauss 3. F/u with infectious disease 4. Home health/DME arranged  Discharge Diagnoses:  Active Hospital Problems   Diagnosis Date Noted  . Closed displaced comminuted fracture of shaft of right tibia 11/01/2017  . Depression 03/04/2015  . Human immunodeficiency virus (HIV) disease (HCC) 11/11/2014  . Asthma 10/02/2010  . Diabetes mellitus type 1 (HCC) 03/01/1991    Resolved Hospital Problems  No resolved problems to display.    Discharge Condition: stable  Diet recommendation: carb modified  There were no vitals filed for this visit.  History of present illness: (per admitting MD DR Opyd) PCP: Lavinia Sharps, NP   Patient coming from: Home   Chief Complaint: Severe right lower leg pain and deformity  HPI: [name] is a 32 y.o. male with medical history significant for insulin-dependent diabetes mellitus, depression with anxiety, asthma, and HIV, now presenting to the emergency department with severe right lower leg pain and deformity after stepping into a hole and hearing a "pop."  Patient reports he was in his usual state of health when the injury occurred, denies any recent cough or shortness of breath, denies chest pain, denies fevers or chills, and reports continued adherence with his medications.  Describes his pain as severe, localized to the distal right leg, and worse with any movement.  ED Course: Upon arrival to the ED, patient is found to be afebrile, saturating well on room air, and with vitals otherwise stable.  EKG features a sinus or ectopic atrial rhythm.  Chemistry panel is  notable for a glucose of 208 and creatinine 1.34, slightly higher than priors.  CBC is unremarkable.  Radiographs of the right lower leg demonstrate comminuted displaced fractures of the mid to distal tibia and fibula, as well as fracture of the proximal fifth metatarsal.  Patient was provided with analgesia in the ED and orthopedic surgery was consulted, recommended medical admission and asking that patient be kept n.p.o.   Hospital Course:  Principal Problem:   Closed displaced comminuted fracture of shaft of right tibia Active Problems:   Diabetes mellitus type 1 (HCC)   Asthma   Human immunodeficiency virus (HIV) disease (HCC)   Depression   Right closed tibia fracture Right closed medial malleolus fracture Right 5th metatarsal base fracture -Presents with severe right lower leg pain and deformity after stepping into a hole and hearing a "pop" -Right tibial shaft fracture/R medial malleolus fracture s/p IMN/ORIF -Right 5th metatarsal base fracture with splint -Weightbearing:NWB RLE -VTE prophylaxis:Start ASA 325 mg daily today -5 days of prn percocet (total of 15tabs) prescribed at discharge. -ortho input appreciated, Follow - up plan:2 weeksfor suture removal and x-rays  DM1 , likely with peripheral neuropathy He reports he is followed by [facility] [id number] Increase Insulin from 15units bid to 20units bid, add meal coverage, continue ssi Start neurontin qhs diabetes education He reports he run out of insulin and testing strips, refills provided at discharge He is to follow up with pcp.  HIV CD4 560 and VL undetectable in April 2019 -Continue Genvoya F/u with infectious disease  Asthma -No wheezing or SOB -Continue prn albuterol  Depression with anxiety -Continue  Celexa   Code Status: full  Family Communication: patient and sister/mother at bedside  Disposition Plan:  d/c home with home health on 9/6   Consultants:  Ortho Dr  Jena Gauss  Procedures: on 9/4 by Dr Jena Gauss 1. CPT 27759-Intramedullary nailing of right tibia 2. CPT 27766-Open reduction internal fixation of right medial malleolus 3. CPT 28475-Closed treatment of right 5th metatarsal base fracture   Antibiotics:  perioperative  Discharge Exam: BP (!) 147/110 (BP Location: Right Arm)   Pulse 75   Temp 98.6 F (37 C) (Oral)   Resp 18   SpO2 100%    General:  NAD  Cardiovascular: RRR  Respiratory: CTABL  Abdomen: Soft/ND/NT, positive BS  Musculoskeletal: right lower extremity post op changes  Neuro: alert, oriented    Discharge Instructions You were cared for by a hospitalist during your hospital stay. If you have any questions about your discharge medications or the care you received while you were in the hospital after you are discharged, you can call the unit and asked to speak with the hospitalist on call if the hospitalist that took care of you is not available. Once you are discharged, your primary care physician will handle any further medical issues. Please note that NO REFILLS for any discharge medications will be authorized once you are discharged, as it is imperative that you return to your primary care physician (or establish a relationship with a primary care physician if you do not have one) for your aftercare needs so that they can reassess your need for medications and monitor your lab values.  Discharge Instructions    Diet Carb Modified   Complete by:  As directed    Increase activity slowly   Complete by:  As directed    nonweightbearing to the right lower extremity     Allergies as of 11/03/2017      Reactions   Shellfish Allergy Anaphylaxis, Hives      Medication List    TAKE these medications   acetaminophen 500 MG tablet Commonly known as:  TYLENOL Take 1 tablet (500 mg total) by mouth every 6 (six) hours as needed.   aspirin 325 MG EC tablet Take 1 tablet (325 mg total) by mouth daily. Start taking on:   11/04/2017   citalopram 20 MG tablet Commonly known as:  CELEXA TAKE 1 TABLET BY MOUTH DAILY   elvitegravir-cobicistat-emtricitabine-tenofovir 150-150-200-10 MG Tabs tablet Commonly known as:  GENVOYA Take 1 tablet by mouth daily.   gabapentin 300 MG capsule Commonly known as:  NEURONTIN Take 1 capsule (300 mg total) by mouth at bedtime.   glucose blood test strip Use as instructed   insulin aspart protamine- aspart (70-30) 100 UNIT/ML injection Commonly known as:  NOVOLOG MIX 70/30 Inject 0.3-0.4 mLs (30-40 Units total) into the skin 2 (two) times daily with a meal. 40 units in the evening   INSULIN SYRINGE 1CC/31GX5/16" 31G X 5/16" 1 ML Misc Use 3 times per day to inject insulin subcutaneous.  Dx code: 250.01   oxyCODONE-acetaminophen 5-325 MG tablet Commonly known as:  PERCOCET/ROXICET Take 1 tablet by mouth every 8 (eight) hours as needed for up to 5 days for severe pain.   polyethylene glycol packet Commonly known as:  MIRALAX / GLYCOLAX Take 17 g by mouth daily.   TRUE METRIX AIR GLUCOSE METER Devi 1 each by Does not apply route 4 (four) times daily -  with meals and at bedtime.   TRUEPLUS LANCETS 28G Misc USE AS DIRECTED  Durable Medical Equipment  (From admission, onward)         Start     Ordered   11/03/17 0949  DME 3-in-1  Once     11/03/17 0948   11/03/17 0949  For home use only DME Walker  [facility])  Once    Question:  Patient needs a walker to treat with the following condition  Answer:  Ankle fracture   11/03/17 0948   11/03/17 0948  For home use only DME lightweight manual wheelchair with seat cushion  (Wheelchairs)  Once    Comments:  Patient suffers from left leg and ankle fracture which impairs their ability to perform daily activities like bathing in the home.  A walker will not resolve  issue with performing activities of daily living. A wheelchair will allow patient to safely perform daily activities. Patient is not able to propel  themselves in the home using a standard weight wheelchair due to general weakness. Patient can self propel in the lightweight wheelchair.  Accessories: elevating leg rests (ELRs), wheel locks, extensions and anti-tippers.   11/03/17 0948         Allergies  Allergen Reactions  . Shellfish Allergy Anaphylaxis and Hives   Follow-up Information    Haddix, Gillie Manners, MD Follow up in 2 week(s).   Specialty:  Orthopedic Surgery Why:  for suture removal and x-rays Contact information: [address] [place] [address] [phone]        Lavinia Sharps, NP Follow up.   Contact information: [address] [place] [address] [phone]            The results of significant diagnostics from this hospitalization (including imaging, microbiology, ancillary and laboratory) are listed below for reference.    Significant Diagnostic Studies: Dg Tibia/fibula Right  Result Date: 11/01/2017 CLINICAL DATA:  Tibial nail with distal tibial plate EXAM: DG C-ARM 61-120 MIN; RIGHT TIBIA AND FIBULA - 2 VIEW COMPARISON:  CT 11/01/2017, radiograph 10/31/2017 FINDINGS: Eleven low resolution intraoperative spot views of the right tibia and fibula. Total fluoroscopy time was 3 minutes 4 seconds. Initial images demonstrate comminuted fractures involving the mid to distal shafts of the tibia and fibula with fracture at the medial malleolus. Subsequent images demonstrate intramedullary rod and proximal and distal screw fixation of the tibia. Additional plate and multiple screw fixation of the distal tibia. Decreased displacement of comminuted fracture fragments involving the mid to distal shaft of the tibia. Comminuted distal fibular shaft fracture with residual displacement. IMPRESSION: Intraoperative fluoroscopic assistance provided during surgical fixation of tibial fractures. Electronically Signed   By: Jasmine Pang M.D.   On: 11/01/2017 14:29   Dg Tibia/fibula Right  Result Date:  10/31/2017 CLINICAL DATA:  Right lower leg pain after stepping in a hole. EXAM: RIGHT TIBIA AND FIBULA - 2 VIEW; RIGHT ANKLE - COMPLETE 3+ VIEW COMPARISON:  None. FINDINGS: Comminuted fracture of the mid to distal tibia diaphysis with 11 mm medial displacement and 16 mm posterior displacement. There is a nondisplaced spiral component which extends into the medial malleolus and tibial plafond. Comminuted fracture of the mid to distal fibular diaphysis with 12 mm medial displacement, 11 mm posterior displacement, and 1.8 cm of overriding fragments. Nondisplaced fracture through the proximal fifth metatarsal. The ankle mortise is symmetric. The talar dome is intact. Small tibiotalar joint effusion. Joint spaces are preserved. Bone mineralization is normal. Diffuse soft tissue swelling of the lower leg. IMPRESSION: 1. Comminuted, displaced fractures of the  mid to distal tibial and fibular diaphyses as described above. The tibial fracture extends into the medial malleolus and tibial plafond. 2. Nondisplaced fracture of the proximal fifth metatarsal. Electronically Signed   By: Obie Dredge M.D.   On: 10/31/2017 20:58   Dg Ankle Complete Right  Result Date: 10/31/2017 CLINICAL DATA:  Right lower leg pain after stepping in a hole. EXAM: RIGHT TIBIA AND FIBULA - 2 VIEW; RIGHT ANKLE - COMPLETE 3+ VIEW COMPARISON:  None. FINDINGS: Comminuted fracture of the mid to distal tibia diaphysis with 11 mm medial displacement and 16 mm posterior displacement. There is a nondisplaced spiral component which extends into the medial malleolus and tibial plafond. Comminuted fracture of the mid to distal fibular diaphysis with 12 mm medial displacement, 11 mm posterior displacement, and 1.8 cm of overriding fragments. Nondisplaced fracture through the proximal fifth metatarsal. The ankle mortise is symmetric. The talar dome is intact. Small tibiotalar joint effusion. Joint spaces are preserved. Bone mineralization is normal. Diffuse  soft tissue swelling of the lower leg. IMPRESSION: 1. Comminuted, displaced fractures of the mid to distal tibial and fibular diaphyses as described above. The tibial fracture extends into the medial malleolus and tibial plafond. 2. Nondisplaced fracture of the proximal fifth metatarsal. Electronically Signed   By: Obie Dredge M.D.   On: 10/31/2017 20:58   Ct Ankle Right Wo Contrast  Result Date: 11/01/2017 CLINICAL DATA:  Twisting injury yesterday. Fractures of the distal tibia and fibula. EXAM: CT OF THE RIGHT ANKLE WITHOUT CONTRAST TECHNIQUE: Multidetector CT imaging of the right ankle was performed according to the standard protocol. Multiplanar CT image reconstructions were also generated. COMPARISON:  Ankle and lower leg radiographs 10/31/2017. FINDINGS: Bones/Joint/Cartilage Images extend from the proximal lower leg through the ankle. Most of the lower leg is included. As seen on previous radiographs, there are comminuted and displaced fractures of the distal thirds of the right tibial and right fibular diaphyses. The tibial fractures associated with 14 mm of posterior displacement. There is a large (7.3 cm) butterfly fragment posteriorly. The fibular fracture is more comminuted and displaced with apex medial angulation. There is a 6.3 cm butterfly fragment. There is distal extension of a nondisplaced fracture into the distal tibial diaphysis. In addition, there is a minimally displaced intra-articular fracture involving the tibial plafond medially, adjacent to the medial malleolus. This demonstrates up to 2 mm of displacement of the articular surface anteriorly (image 123/3). There is also a nondisplaced fracture of the distal fibula, best seen on sagittal images 21-23. The talus is located. No evidence of talar dome injury. The additional visualized tarsal bones appear intact. The 5th metatarsal base is not included on this study. Ligaments Suboptimally assessed by CT. The anterior talofibular  ligament appears mildly thickened. There is no significant widening of the ankle mortise. Muscles and Tendons The visualized ankle tendons appear intact without entrapment in the fractures. Soft tissues There is soft tissue swelling surrounding the ankle. There is additional subcutaneous edema medially in the mid lower leg adjacent to the fractures. No focal hematoma or foreign body. The lower leg is casted. IMPRESSION: 1. Comminuted and displaced fractures of the distal tibial and fibular diaphyses as described. 2. Distal extension of a nondisplaced fracture into the distal tibia. There is a minimally displaced fracture involving the tibial plafond and medially. 3. Nondisplaced fracture of the distal fibula. 4. No evidence of tarsal bone fracture or dislocation. Electronically Signed   By: Carey Bullocks M.D.   On: 11/01/2017  10:53   Dg Chest Port 1 View  Result Date: 11/01/2017 CLINICAL DATA:  Preop for ORIF of distal tibia and fibula fractures. EXAM: PORTABLE CHEST 1 VIEW COMPARISON:  05/20/2016 FINDINGS: The cardiomediastinal silhouette is unchanged with normal heart size. The lungs are clear. No pleural effusion or pneumothorax is identified. No acute osseous abnormality is seen. IMPRESSION: No active disease. Electronically Signed   By: Sebastian Ache M.D.   On: 11/01/2017 10:34   Dg Tibia/fibula Right Port  Result Date: 11/01/2017 CLINICAL DATA:  ORIF of right tibial fractures. EXAM: PORTABLE RIGHT TIBIA AND FIBULA - 2 VIEW COMPARISON:  10/31/2017 FINDINGS: An intramedullary rod is been placed extending from the proximal metaphysis to the distal metaphysis. It spans the spiral comminuted fracture of the mid to distal tibial shaft. There has been a significant reduction in fracture displacement, with no significant residual angulation. The orthopedic hardware appears well seated and aligned. The distal fibular fracture is less displaced than on the pre reduction images, with significant improvement in  angulation. There is a subtle transverse nondisplaced fracture of the distal fibula at the metadiaphysis. Knee and ankle joints are normally aligned. IMPRESSION: 1. Significant reduction of the tibial shaft fracture following ORIF. Electronically Signed   By: Amie Portland M.D.   On: 11/01/2017 15:26   Dg Ankle Right Port  Result Date: 11/01/2017 CLINICAL DATA:  ORIF right tibia and fibular fractures EXAM: PORTABLE RIGHT ANKLE - 2 VIEW COMPARISON:  10/31/2017 FINDINGS: Ankylosis showed the distal aspect of the intramedullary rod which crosses the comminuted fracture of tibial shaft. The rod appears well-seated. There is a medial fixation plate across the distal tibia from the metadiaphysis to the medial malleolus, with associated fixation screws, fixating the medial malleolar fracture into anatomic alignment. The fibular shaft fracture is unchanged from the previous day's study. Ankle joint is normally aligned. No new fractures or evidence of an operative complication. IMPRESSION: 1. Well-aligned fracture the medial malleolus following ORIF. Electronically Signed   By: Amie Portland M.D.   On: 11/01/2017 15:22   Dg Foot Complete Right  Result Date: 11/01/2017 CLINICAL DATA:  ORIF of the right leg. EXAM: RIGHT FOOT COMPLETE - 3+ VIEW COMPARISON:  10/31/2017 FINDINGS: Distal aspect of the ORIF hardware along the distal tibia is noted, well-seated. Subtle nondisplaced fractures noted of the distal fibula at the metadiaphysis. There is a comminuted fracture of the fifth metatarsal, across the metaphysis extending to the articular surface at the cuboid metatarsal articulation. No other foot fractures. Joints are normally spaced and aligned. IMPRESSION: 1. ORIF of tibial fractures. 2. Nondisplaced fracture of the distal fibula at the metadiaphysis. 3. Comminuted fracture of the proximal fifth metatarsal. Electronically Signed   By: Amie Portland M.D.   On: 11/01/2017 15:24   Dg C-arm 1-60 Min  Result Date:  11/01/2017 CLINICAL DATA:  Tibial nail with distal tibial plate EXAM: DG C-ARM 61-120 MIN; RIGHT TIBIA AND FIBULA - 2 VIEW COMPARISON:  CT 11/01/2017, radiograph 10/31/2017 FINDINGS: Eleven low resolution intraoperative spot views of the right tibia and fibula. Total fluoroscopy time was 3 minutes 4 seconds. Initial images demonstrate comminuted fractures involving the mid to distal shafts of the tibia and fibula with fracture at the medial malleolus. Subsequent images demonstrate intramedullary rod and proximal and distal screw fixation of the tibia. Additional plate and multiple screw fixation of the distal tibia. Decreased displacement of comminuted fracture fragments involving the mid to distal shaft of the tibia. Comminuted distal fibular shaft fracture with  residual displacement. IMPRESSION: Intraoperative fluoroscopic assistance provided during surgical fixation of tibial fractures. Electronically Signed   By: Jasmine Pang M.D.   On: 11/01/2017 14:29   Dg C-arm 1-60 Min  Result Date: 11/01/2017 CLINICAL DATA:  Tibial nail with distal tibial plate EXAM: DG C-ARM 61-120 MIN; RIGHT TIBIA AND FIBULA - 2 VIEW COMPARISON:  CT 11/01/2017, radiograph 10/31/2017 FINDINGS: Eleven low resolution intraoperative spot views of the right tibia and fibula. Total fluoroscopy time was 3 minutes 4 seconds. Initial images demonstrate comminuted fractures involving the mid to distal shafts of the tibia and fibula with fracture at the medial malleolus. Subsequent images demonstrate intramedullary rod and proximal and distal screw fixation of the tibia. Additional plate and multiple screw fixation of the distal tibia. Decreased displacement of comminuted fracture fragments involving the mid to distal shaft of the tibia. Comminuted distal fibular shaft fracture with residual displacement. IMPRESSION: Intraoperative fluoroscopic assistance provided during surgical fixation of tibial fractures. Electronically Signed   By: Jasmine Pang M.D.   On: 11/01/2017 14:29    Microbiology: Recent Results (from the past 240 hour(s))  MRSA PCR Screening     Status: None   Collection Time: 11/01/17  4:33 AM  Result Value Ref Range Status   MRSA by PCR NEGATIVE NEGATIVE Final    Comment:        The GeneXpert MRSA Assay (FDA approved for NASAL specimens only), is one component of a comprehensive MRSA colonization surveillance program. It is not intended to diagnose MRSA infection nor to guide or monitor treatment for MRSA infections. Performed at [facility], [address], [place], [place] [address]   Surgical pcr screen     Status: None   Collection Time: 11/01/17  4:33 AM  Result Value Ref Range Status   MRSA, PCR NEGATIVE NEGATIVE Final   Staphylococcus aureus NEGATIVE NEGATIVE Final    Comment: (NOTE) The Xpert SA Assay (FDA approved for NASAL specimens in patients 2 years of age and older), is one component of a comprehensive surveillance program. It is not intended to diagnose infection nor to guide or monitor treatment. Performed at [facility], [address], [place], [place] [address]      Labs: Basic Metabolic Panel: Recent Labs  Lab 10/31/17 2007 11/01/17 0400 11/02/17 0440 11/03/17 0516  NA 140 139 135 136  K 4.1 4.3 4.1 3.8  CL 105 103 101 101  CO2 24 25 26 27   GLUCOSE 208* 242* 243* 128*  BUN 11 11 14 13   CREATININE 1.34* 1.03 1.20 1.08  CALCIUM 9.1 8.8* 8.8* 8.9  MG  --   --  1.9  --    Liver Function Tests: Recent Labs  Lab 11/02/17 0440  AST 17  ALT 17  ALKPHOS 46  BILITOT 1.1  PROT 6.2*  ALBUMIN 3.3*   No results for input(s): LIPASE, AMYLASE in the last 168 hours. No results for input(s): AMMONIA in the last 168 hours. CBC: Recent Labs  Lab 10/31/17 2007 11/01/17 0400 11/02/17 0440 11/03/17 0516  WBC 8.4 10.7* 9.6 7.8  NEUTROABS 3.8  --  6.8  --   HGB 14.7 14.0 13.2 12.4*  HCT 43.4 40.9 38.7* 36.8*  MCV 93.7 91.9 91.5 92.7  PLT 254 249 225  197   Cardiac Enzymes: No results for input(s): CKTOTAL, CKMB, CKMBINDEX, TROPONINI in the last 168 hours. BNP: BNP (last 3 results) No results for input(s): BNP in the last 8760 hours.  ProBNP (  last 3 results) No results for input(s): PROBNP in the last 8760 hours.  CBG: Recent Labs  Lab 11/02/17 1203 11/02/17 1654 11/02/17 2140 11/03/17 0032 11/03/17 0743  GLUCAP 223* 137* 103* 158* 82       Signed:  Albertine Grates MD, PhD  [facility] 11/03/2017, 9:56 AM

## 2017-11-03 NOTE — Plan of Care (Signed)

## 2017-11-03 NOTE — Progress Notes (Signed)
Pt given prescriptions and discharge instructions. All questions answered to satisfaction. All belongings and equipment gathered to be sent home. Pt awaiting ride. Will call when they get here.

## 2017-11-03 NOTE — Progress Notes (Signed)
Physical Therapy Treatment Patient Details Name: [name] MRN: [id number] DOB: 1985-09-16 Today's Date: 11/03/2017    History of Present Illness Pt is a 32 y/o male s/p IM nail R tibia and ORIF R medial maleolus secondary to sustaining injuries with a fall. PMH including but not limited to DM, HIV and anxiety.    PT Comments    Pt required assistance to ambulate with min guard and able to progress gait significantly to where he no longer requires a WC for home use.  Pt is to d/c home with support from his mother.  He does report he needs to return home briefly to gather his clothes and belongings before ultimately staying at his mothers home.  Reviewed curb negotiation x2 as he reports needing to negotiate 2 curb type stairs to enter his home.  Pt remains appropriate for HHPT at d/c.  Will inform supervising PT of need for change in recommendations of equipment at this time.    Follow Up Recommendations  Home health PT;Supervision/Assistance - 24 hour     Equipment Recommendations  Rolling walker with 5" wheels;3in1 (PT)(Pt no longer requiring a WC as he is able to ambulate 80 ft with min guard assistance with RW.  )    Recommendations for Other Services       Precautions / Restrictions Precautions Precautions: Fall Restrictions Weight Bearing Restrictions: Yes RLE Weight Bearing: Non weight bearing    Mobility  Bed Mobility               General bed mobility comments: Pt in recliner chair, able to scoot to edge of chair and move lever to decline and recline foot rest independently.    Transfers Overall transfer level: Needs assistance Equipment used: Rolling walker (2 wheeled) Transfers: Sit to/from Stand Sit to Stand: Supervision         General transfer comment: Cues for hand placement good ability to maintain weight bearing during movement.  Ambulation/Gait Ambulation/Gait assistance: Min guard Gait Distance (Feet): 80 Feet(additional trial of 10 ft x2  during curb training.  ) Assistive device: Rolling walker (2 wheeled) Gait Pattern/deviations: Step-to pattern;Trunk flexed(hop to pattern.  )     General Gait Details: Cues for upper trunk control, RW safety and hop to pattern.     Stairs Stairs: Yes Stairs assistance: Min guard Stair Management: No rails;Backwards;Forwards;With walker Number of Stairs: 2 General stair comments: Cues for sequencing and RW placement to simulate entry into his home.  Mother present and observed technique.     Wheelchair Mobility    Modified Rankin (Stroke Patients Only)       Balance Overall balance assessment: Needs assistance Sitting-balance support: Feet supported Sitting balance-Leahy Scale: Good     Standing balance support: During functional activity;Bilateral upper extremity supported Standing balance-Leahy Scale: Fair Standing balance comment: Pt able to release walker for brief periods of time.                              Cognition Arousal/Alertness: Awake/alert Behavior During Therapy: WFL for tasks assessed/performed Overall Cognitive Status: Within Functional Limits for tasks assessed                                        Exercises      General Comments        Pertinent Vitals/Pain Pain Assessment:  Faces Faces Pain Scale: Hurts even more Pain Location: R LE Pain Descriptors / Indicators: Sore;Grimacing;Guarding Pain Intervention(s): Monitored during session;Repositioned;Ice applied    Home Living                      Prior Function            PT Goals (current goals can now be found in the care plan section) Acute Rehab PT Goals Patient Stated Goal: decrease pain Potential to Achieve Goals: Good Progress towards PT goals: Progressing toward goals    Frequency    Min 5X/week      PT Plan Discharge plan needs to be updated    Co-evaluation              AM-PAC PT "6 Clicks" Daily Activity  Outcome  Measure  Difficulty turning over in bed (including adjusting bedclothes, sheets and blankets)?: None Difficulty moving from lying on back to sitting on the side of the bed? : None Difficulty sitting down on and standing up from a chair with arms (e.g., wheelchair, bedside commode, etc,.)?: None Help needed moving to and from a bed to chair (including a wheelchair)?: A Little Help needed walking in hospital room?: A Little Help needed climbing 3-5 steps with a railing? : A Little 6 Click Score: 21    End of Session Equipment Utilized During Treatment: Gait belt Activity Tolerance: Patient limited by pain;Patient limited by lethargy;Patient limited by fatigue Patient left: in chair;with call bell/phone within reach Nurse Communication: Mobility status PT Visit Diagnosis: Other abnormalities of gait and mobility (R26.89)     Time: 5366-4403 PT Time Calculation (min) (ACUTE ONLY): 25 min  Charges:  $Gait Training: 8-22 mins $Therapeutic Activity: 8-22 mins                     Joycelyn Rua, PTA Acute Rehabilitation Services Pager [phone] Office [phone]     [name] Artis Delay 11/03/2017, 10:40 AM

## 2017-11-03 NOTE — Progress Notes (Signed)
Occupational Therapy Treatment Patient Details Name: [name] MRN: [id number] DOB: Jul 04, 1985 Today's Date: 11/03/2017    History of present illness Pt is a 32 y/o male s/p IM nail R tibia and ORIF R medial maleolus secondary to sustaining injuries with a fall. PMH including but not limited to DM, HIV and anxiety.   OT comments  Pt alert today and able to participate and verbalize understanding of safety, compensatory strategies and use of DME and AE as instructed during ADL and IADL. Reinforced importance of keeping R LE elevated.  Follow Up Recommendations  Home health OT    Equipment Recommendations  3 in 1 bedside commode    Recommendations for Other Services      Precautions / Restrictions Precautions Precautions: Fall Restrictions Weight Bearing Restrictions: Yes RLE Weight Bearing: Non weight bearing       Mobility Bed Mobility               General bed mobility comments: pt in chair  Transfers Overall transfer level: Needs assistance Equipment used: Rolling walker (2 wheeled) Transfers: Sit to/from Stand Sit to Stand: Supervision         General transfer comment: for safety, maintains NWB without cues    Balance Overall balance assessment: Needs assistance Sitting-balance support: Feet supported Sitting balance-Leahy Scale: Good     Standing balance support: During functional activity;Bilateral upper extremity supported Standing balance-Leahy Scale: Fair Standing balance comment: Pt able to release walker for brief periods of time.                             ADL either performed or assessed with clinical judgement   ADL Overall ADL's : Needs assistance/impaired     Grooming: Set up;Sitting;Oral care;Wash/dry hands;Wash/dry face         Lower Body Bathing Details (indicate cue type and reason): educated in leaning side to side in sitting to bathe     Lower Body Dressing: Supervision/safety;Sitting/lateral leans Lower  Body Dressing Details (indicate cue type and reason): educated in use of reacher, to dress R LE first and lean side to side to pull up pants   Toilet Transfer Details (indicate cue type and reason): pt aware he can use 3 in 1 over toilet to elevate   Toileting - Clothing Manipulation Details (indicate cue type and reason): educated pt to lean side to side for pericare and to manage pants   Tub/Shower Transfer Details (indicate cue type and reason): instructed in technique for tub transfer using 3 in 1, avoiding weight bearing on R LE   General ADL Comments: Instructed in how to transport items safely with RW.     Vision       Perception     Praxis      Cognition Arousal/Alertness: Awake/alert Behavior During Therapy: WFL for tasks assessed/performed Overall Cognitive Status: Within Functional Limits for tasks assessed                                          Exercises     Shoulder Instructions       General Comments      Pertinent Vitals/ Pain       Pain Assessment: Faces Faces Pain Scale: Hurts even more Pain Location: R LE Pain Descriptors / Indicators: Sore;Grimacing;Guarding Pain Intervention(s): Repositioned;Monitored during session  Home Living  Prior Functioning/Environment              Frequency  Min 2X/week        Progress Toward Goals  OT Goals(current goals can now be found in the care plan section)  Progress towards OT goals: Not progressing toward goals - comment  Acute Rehab OT Goals Patient Stated Goal: decrease pain OT Goal Formulation: With patient Time For Goal Achievement: 11/16/17 Potential to Achieve Goals: Good  Plan Discharge plan remains appropriate    Co-evaluation                 AM-PAC PT "6 Clicks" Daily Activity     Outcome Measure   Help from another person eating meals?: None Help from another person taking care of personal  grooming?: A Little Help from another person toileting, which includes using toliet, bedpan, or urinal?: A Little Help from another person bathing (including washing, rinsing, drying)?: A Little Help from another person to put on and taking off regular upper body clothing?: None Help from another person to put on and taking off regular lower body clothing?: A Little 6 Click Score: 20    End of Session Equipment Utilized During Treatment: Gait belt;Rolling walker  OT Visit Diagnosis: Unsteadiness on feet (R26.81);Other abnormalities of gait and mobility (R26.89);Pain   Activity Tolerance Patient tolerated treatment well   Patient Left in chair;with call bell/phone within reach   Nurse Communication          Time: 5397-6734 OT Time Calculation (min): 33 min  Charges: OT General Charges $OT Visit: 1 Visit OT Treatments $Self Care/Home Management : 23-37 mins  11/03/2017 Martie Round, OTR/L Pager: [phone]   Iran Planas, [place] Bailiff 11/03/2017, 11:06 AM

## 2017-11-03 NOTE — Discharge Instructions (Signed)
Orthopaedic Trauma Service Discharge Instructions   General Discharge Instructions  WEIGHT BEARING STATUS: Non weight bearing to right leg  RANGE OF MOTION/ACTIVITY:No restrictions to knee or hip range of motion  Wound Care: Keep splint clean, dry and intact until follow up. Do not remove  DVT/PE prophylaxis: Take a full dose aspirin for about 1 month after the surgery  Diet: as you were eating previously.  Can use over the counter stool softeners and bowel preparations, such as Miralax, to help with bowel movements.  Narcotics can be constipating.  Be sure to drink plenty of fluids  PAIN MEDICATION USE AND EXPECTATIONS  You have likely been given narcotic medications to help control your pain.  After a traumatic event that results in an fracture (broken bone) with or without surgery, it is ok to use narcotic pain medications to help control one's pain.  We understand that everyone responds to pain differently and each individual patient will be evaluated on a regular basis for the continued need for narcotic medications. Ideally, narcotic medication use should last no more than 6-8 weeks (coinciding with fracture healing).   As a patient it is your responsibility as well to monitor narcotic medication use and report the amount and frequency you use these medications when you come to your office visit.   We would also advise that if you are using narcotic medications, you should take a dose prior to therapy to maximize you participation.  IF YOU ARE ON NARCOTIC MEDICATIONS IT IS NOT PERMISSIBLE TO OPERATE A MOTOR VEHICLE (MOTORCYCLE/CAR/TRUCK/MOPED) OR HEAVY MACHINERY DO NOT MIX NARCOTICS WITH OTHER CNS (CENTRAL NERVOUS SYSTEM) DEPRESSANTS SUCH AS ALCOHOL   STOP SMOKING OR USING NICOTINE PRODUCTS!!!!  As discussed nicotine severely impairs your body's ability to heal surgical and traumatic wounds but also impairs bone healing.  Wounds and bone heal by forming microscopic blood vessels  (angiogenesis) and nicotine is a vasoconstrictor (essentially, shrinks blood vessels).  Therefore, if vasoconstriction occurs to these microscopic blood vessels they essentially disappear and are unable to deliver necessary nutrients to the healing tissue.  This is one modifiable factor that you can do to dramatically increase your chances of healing your injury.    (This means no smoking, no nicotine gum, patches, etc)  DO NOT USE NONSTEROIDAL ANTI-INFLAMMATORY DRUGS (NSAID'S)  Using products such as Advil (ibuprofen), Aleve (naproxen), Motrin (ibuprofen) for additional pain control during fracture healing can delay and/or prevent the healing response.  If you would like to take over the counter (OTC) medication, Tylenol (acetaminophen) is ok.  However, some narcotic medications that are given for pain control contain acetaminophen as well. Therefore, you should not exceed more than 4000 mg of tylenol in a day if you do not have liver disease.  Also note that there are may OTC medicines, such as cold medicines and allergy medicines that my contain tylenol as well.  If you have any questions about medications and/or interactions please ask your doctor/PA or your pharmacist.      ICE AND ELEVATE INJURED/OPERATIVE EXTREMITY  Using ice and elevating the injured extremity above your heart can help with swelling and pain control.  Icing in a pulsatile fashion, such as 20 minutes on and 20 minutes off, can be followed.    Do not place ice directly on skin. Make sure there is a barrier between to skin and the ice pack.    Using frozen items such as frozen peas works well as the conform nicely to the are that  needs to be iced.  USE AN ACE WRAP OR TED HOSE FOR SWELLING CONTROL  In addition to icing and elevation, Ace wraps or TED hose are used to help limit and resolve swelling.  It is recommended to use Ace wraps or TED hose until you are informed to stop.    When using Ace Wraps start the wrapping distally  (farthest away from the body) and wrap proximally (closer to the body)   Example: If you had surgery on your leg or thing and you do not have a splint on, start the ace wrap at the toes and work your way up to the thigh        If you had surgery on your upper extremity and do not have a splint on, start the ace wrap at your fingers and work your way up to the upper arm  IF YOU ARE IN A SPLINT OR CAST DO NOT REMOVE IT FOR ANY REASON   If your splint gets wet for any reason please contact the office immediately. You may shower in your splint or cast as long as you keep it dry.  This can be done by wrapping in a cast cover or garbage back (or similar)  Do Not stick any thing down your splint or cast such as pencils, money, or hangers to try and scratch yourself with.  If you feel itchy take benadryl as prescribed on the bottle for itching  IF YOU ARE IN A CAM BOOT (BLACK BOOT)  You may remove boot periodically. Perform daily dressing changes as noted below.  Wash the liner of the boot regularly and wear a sock when wearing the boot. It is recommended that you sleep in the boot until told otherwise  CALL THE OFFICE WITH ANY QUESTIONS OR CONCERNS: [phone]

## 2017-11-03 NOTE — Care Management Note (Signed)
Case Management Note  Patient Details  Name: [name] MRN: [id number] Date of Birth: [birth year]  Subjective/Objective: Pt is a 32 y/o male s/p IM nail R tibia and ORIF R medial maleolus secondary to sustaining injuries with a fall.                   Action/Plan: Case manager spoke with patient concerning discharge plan and DME. Patient will be going to a family member's home: [address], [place], [place], his cell#:[phone]. Referral for Home Health called to Shon Millet, [facility] Liaison.     Expected Discharge Date:  11/03/17               Expected Discharge Plan:  Home w Home Health Services  In-House Referral:  NA  Discharge planning Services  CM Consult  Post Acute Care Choice:  Durable Medical Equipment, Home Health Choice offered to:  Patient  DME Arranged:  3-N-1, Walker rolling DME Agency:  [facility]  HH Arranged:  PT, OT [facility] Agency:  [facility]  Status of Service:  Completed, signed off  If discussed at Long Length of Stay Meetings, dates discussed:    Additional Comments:  Durenda Guthrie, RN 11/03/2017, 11:11 AM

## 2017-11-13 ENCOUNTER — Encounter: Payer: Self-pay | Admitting: Student

## 2017-11-13 DIAGNOSIS — S92351A Displaced fracture of fifth metatarsal bone, right foot, initial encounter for closed fracture: Secondary | ICD-10-CM | POA: Insufficient documentation

## 2017-11-13 HISTORY — DX: Displaced fracture of fifth metatarsal bone, right foot, initial encounter for closed fracture: S92.351A

## 2017-12-06 ENCOUNTER — Other Ambulatory Visit: Payer: Self-pay | Admitting: Internal Medicine

## 2017-12-06 DIAGNOSIS — B20 Human immunodeficiency virus [HIV] disease: Secondary | ICD-10-CM

## 2017-12-11 ENCOUNTER — Other Ambulatory Visit: Payer: Self-pay | Admitting: *Deleted

## 2017-12-11 ENCOUNTER — Other Ambulatory Visit: Payer: No Typology Code available for payment source

## 2017-12-11 DIAGNOSIS — Z79899 Other long term (current) drug therapy: Secondary | ICD-10-CM

## 2017-12-11 DIAGNOSIS — B20 Human immunodeficiency virus [HIV] disease: Secondary | ICD-10-CM

## 2017-12-11 DIAGNOSIS — Z113 Encounter for screening for infections with a predominantly sexual mode of transmission: Secondary | ICD-10-CM

## 2017-12-22 ENCOUNTER — Telehealth: Payer: Self-pay | Admitting: Pharmacist

## 2017-12-22 ENCOUNTER — Other Ambulatory Visit: Payer: Self-pay

## 2017-12-22 DIAGNOSIS — Z79899 Other long term (current) drug therapy: Secondary | ICD-10-CM

## 2017-12-22 DIAGNOSIS — Z113 Encounter for screening for infections with a predominantly sexual mode of transmission: Secondary | ICD-10-CM

## 2017-12-22 DIAGNOSIS — B20 Human immunodeficiency virus [HIV] disease: Secondary | ICD-10-CM

## 2017-12-22 LAB — T-HELPER CELL (CD4) - (RCID CLINIC ONLY)
CD4 % Helper T Cell: 32 % — ABNORMAL LOW (ref 33–55)
CD4 T Cell Abs: 610 /uL (ref 400–2700)

## 2017-12-22 MED ORDER — ELVITEG-COBIC-EMTRICIT-TENOFAF 150-150-200-10 MG PO TABS: 1.0000 | ORAL_TABLET | Freq: Every day | ORAL | 2 refills | Status: DC

## 2017-12-22 NOTE — Telephone Encounter (Signed)
Patient came in for ADAP and medication assistance.  He lost his job and has no insurance now. He has been out of [facility] for 2 days.  I was able to get him a 30 day immediate fill from [facility] and will send it to [facility].

## 2017-12-25 ENCOUNTER — Ambulatory Visit: Payer: No Typology Code available for payment source | Admitting: Internal Medicine

## 2017-12-25 ENCOUNTER — Encounter: Payer: Self-pay | Admitting: Infectious Diseases

## 2017-12-25 LAB — CBC WITH DIFFERENTIAL/PLATELET
BASOS ABS: 32 {cells}/uL (ref 0–200)
Basophils Relative: 0.5 %
Eosinophils Absolute: 160 cells/uL (ref 15–500)
Eosinophils Relative: 2.5 %
HEMATOCRIT: 44.9 % (ref 38.5–50.0)
Hemoglobin: 15.3 g/dL (ref 13.2–17.1)
LYMPHS ABS: 1824 {cells}/uL (ref 850–3900)
MCH: 30.8 pg (ref 27.0–33.0)
MCHC: 34.1 g/dL (ref 32.0–36.0)
MCV: 90.5 fL (ref 80.0–100.0)
MPV: 10 fL (ref 7.5–12.5)
Monocytes Relative: 6.6 %
NEUTROS PCT: 61.9 %
Neutro Abs: 3962 cells/uL (ref 1500–7800)
Platelets: 349 10*3/uL (ref 140–400)
RBC: 4.96 10*6/uL (ref 4.20–5.80)
RDW: 12.2 % (ref 11.0–15.0)
Total Lymphocyte: 28.5 %
WBC: 6.4 10*3/uL (ref 3.8–10.8)
WBCMIX: 422 {cells}/uL (ref 200–950)

## 2017-12-25 LAB — COMPLETE METABOLIC PANEL WITH GFR
AG Ratio: 1.5 (calc) (ref 1.0–2.5)
ALT: 28 U/L (ref 9–46)
AST: 13 U/L (ref 10–40)
Albumin: 4.7 g/dL (ref 3.6–5.1)
Alkaline phosphatase (APISO): 96 U/L (ref 40–115)
BUN: 15 mg/dL (ref 7–25)
CALCIUM: 10.4 mg/dL — AB (ref 8.6–10.3)
CO2: 29 mmol/L (ref 20–32)
CREATININE: 1.26 mg/dL (ref 0.60–1.35)
Chloride: 100 mmol/L (ref 98–110)
GFR, EST NON AFRICAN AMERICAN: 76 mL/min/{1.73_m2} (ref >=60)
GFR, Est African American: 88 mL/min/{1.73_m2} (ref >=60)
GLOBULIN: 3.2 g/dL (ref 1.9–3.7)
Glucose, Bld: 192 mg/dL — ABNORMAL HIGH (ref 65–99)
Potassium: 4.1 mmol/L (ref 3.5–5.3)
SODIUM: 137 mmol/L (ref 135–146)
Total Bilirubin: 0.6 mg/dL (ref 0.2–1.2)
Total Protein: 7.9 g/dL (ref 6.1–8.1)

## 2017-12-25 LAB — RPR: RPR: NONREACTIVE

## 2017-12-25 LAB — HIV-1 RNA QUANT-NO REFLEX-BLD
HIV 1 RNA QUANT: NOT DETECTED {copies}/mL
HIV-1 RNA QUANT, LOG: NOT DETECTED {Log_copies}/mL

## 2017-12-25 LAB — LIPID PANEL
CHOL/HDL RATIO: 2.3 (calc) (ref <5.0)
Cholesterol: 206 mg/dL — ABNORMAL HIGH (ref <200)
HDL: 90 mg/dL (ref >40)
LDL Cholesterol (Calc): 98 mg/dL (calc)
NON-HDL CHOLESTEROL (CALC): 116 mg/dL (ref <130)
TRIGLYCERIDES: 86 mg/dL (ref <150)

## 2018-01-02 ENCOUNTER — Telehealth: Payer: Self-pay | Admitting: Pharmacy Technician

## 2018-01-02 NOTE — Telephone Encounter (Signed)
No answer, No voicemail.  He did not pick up HIV medication from [facility], got patient assistance.  Offered to mail, he stated he would like to pick up, but never did.

## 2018-01-04 ENCOUNTER — Telehealth: Payer: Self-pay | Admitting: Pharmacy Technician

## 2018-01-04 NOTE — Telephone Encounter (Signed)
Got patient assistance for HIV medication until ADAP approved.  Filled med at [facility] and he never picked up.

## 2018-01-09 ENCOUNTER — Ambulatory Visit: Payer: No Typology Code available for payment source | Admitting: Infectious Diseases

## 2018-01-11 MED FILL — HUMULIN 70/30 VIAL: (70-30) 100 | 25 days supply | Qty: 10 | Fill #0

## 2018-02-14 MED FILL — !HUMULIN 70/30 VIAL: 70/30 | 25 days supply | Qty: 10 | Fill #1

## 2018-02-16 ENCOUNTER — Encounter (HOSPITAL_COMMUNITY): Payer: Self-pay | Admitting: Emergency Medicine

## 2018-02-16 ENCOUNTER — Ambulatory Visit (HOSPITAL_COMMUNITY)
Admission: EM | Admit: 2018-02-16 | Discharge: 2018-02-16 | Disposition: A | Discharge status: Home / Self Care | Payer: No Typology Code available for payment source | Attending: Internal Medicine | Admitting: Internal Medicine

## 2018-02-16 DIAGNOSIS — B20 Human immunodeficiency virus [HIV] disease: Secondary | ICD-10-CM

## 2018-02-16 DIAGNOSIS — L0201 Cutaneous abscess of face: Secondary | ICD-10-CM

## 2018-02-16 MED ORDER — CEPHALEXIN 500 MG PO CAPS: 500.0000 mg | ORAL_CAPSULE | Freq: Two times a day (BID) | ORAL | 0 refills | Status: AC

## 2018-02-16 MED ORDER — HYDROCODONE-ACETAMINOPHEN 5-325 MG PO TABS: 1.0000 | ORAL_TABLET | Freq: Four times a day (QID) | ORAL | 0 refills | Status: DC | PRN

## 2018-02-16 MED ORDER — SULFAMETHOXAZOLE-TRIMETHOPRIM 800-160 MG PO TABS: 1.0000 | ORAL_TABLET | Freq: Two times a day (BID) | ORAL | 0 refills | Status: AC

## 2018-02-16 MED ORDER — LIDOCAINE HCL 2 % IJ SOLN
INTRAMUSCULAR | Status: AC
  Filled 2018-02-16: qty 20

## 2018-02-16 NOTE — Discharge Instructions (Addendum)
Boil on forehead was debrided today:  dead tissue was removed to allow healing.  You may be sore this evening. Anticipate healing of wound over the next couple weeks.  Wash gently with soap/water 1-2 times daily, apply antibiotic ointment and bandage.  Recheck if any increasing redness/swelling/pain/drainage or new fever>100.5, or if pain/swelling not starting to improve in a few days.  Prescriptions for cephalexin, trimethoprim/sulfa, and mupirocin ointment (antibiotics) and a small number of vicodin (#6 for severe pain) were sent to the pharmacy.  Ibuprofen or aleve otc will also help reduce pain while healing occurs.

## 2018-02-16 NOTE — ED Triage Notes (Signed)
Pt here for possible insect bite with swelling and draining to forehead

## 2018-02-16 NOTE — ED Provider Notes (Signed)
[facility]    CSN: [id number] Arrival date & time: 02/16/18  [id number]     History   Chief Complaint Chief Complaint  Patient presents with  . Insect Bite    HPI [name] is a 32 y.o. male .  He has past medical history notable for diabetes and HIV disease.  He presents with 4-day history of increasingly painful swelling of the central forehead at the hairline.  This has drained scant amounts of purulent material, but is not resolving.  He is worried that an insect has bitten him.  No fever, no malaise.  Has had "spider bites" in other body areas.   HPI  Past Medical History:  Diagnosis Date  . Anxiety   . Asthma    no prior hospitalizations, intubations  . Depression   . Headache    "only w/stress" (11/01/2017)  . HIV (human immunodeficiency virus infection) (HCC)   . Migraine    "a few/month" (11/01/2017)  . Refusal of blood transfusions as patient is Jehovah's Witness   . Seizure (HCC)    "only w/my low blood sugars"  (11/01/2017)  . Type I diabetes mellitus [facility](HCC)     Patient Active Problem List   Diagnosis Date Noted  . Closed fracture of base of fifth metatarsal bone of right foot 11/13/2017  . Closed displaced comminuted fracture of shaft of right tibia 11/01/2017  . Closed fracture of medial malleolus of right ankle 10/31/2017  . Return to work evaluation 12/14/2016  . Vaccine counseling 12/14/2016  . Screening examination for venereal disease 06/09/2015  . Encounter for long-term (current) use of medications 06/09/2015  . Depression 03/04/2015  . Dizziness and giddiness 03/04/2015  . Tobacco dependence 11/12/2014  . Human immunodeficiency virus (HIV) disease (HCC) 11/11/2014  . Stress 12/19/2012  . Headache(784.0) 01/04/2012  . Asthma 10/02/2010  . Diabetes mellitus type 1 (HCC) 03/01/1991    Past Surgical History:  Procedure Laterality Date  . FRACTURE SURGERY    . IM NAILING TIBIA Right 11/01/2017   INTRAMEDULLARY (IM) NAIL  TIBIALRightGeneral  . ORIF ANKLE FRACTURE Right 11/01/2017  . ORIF ANKLE FRACTURE Right 11/01/2017   Procedure: OPEN REDUCTION INTERNAL FIXATION (ORIF) ANKLE FRACTURE;  Surgeon: Roby LoftsHaddix, Kevin P, MD;  Location: [facility] OR;  Service: Orthopedics;  Laterality: Right;  . TIBIA IM NAIL INSERTION Right 11/01/2017   Procedure: INTRAMEDULLARY (IM) NAIL TIBIAL;  Surgeon: Roby LoftsHaddix, Kevin P, MD;  Location: [facility] OR;  Service: Orthopedics;  Laterality: Right;       Home Medications    Prior to Admission medications   Medication Sig Start Date End Date Taking? Authorizing Provider  acetaminophen (TYLENOL) 500 MG tablet Take 1 tablet (500 mg total) by mouth every 6 (six) hours as needed. 02/10/15   Massie MaroonHollis, Lachina M, FNP  aspirin 325 MG EC tablet Take 1 tablet (325 mg total) by mouth daily. 11/04/17   Albertine GratesXu, Fang, MD  Blood Glucose Monitoring Suppl (TRUE METRIX AIR GLUCOSE METER) DEVI 1 each by Does not apply route 4 (four) times daily -  with meals and at bedtime. 11/12/14   Massie MaroonHollis, Lachina M, FNP  cephALEXin (KEFLEX) 500 MG capsule Take 1 capsule (500 mg total) by mouth 2 (two) times daily for 7 days. 02/16/18 02/23/18  Isa RankinMurray, Quynh Basso Wilson, MD  citalopram (CELEXA) 20 MG tablet TAKE 1 TABLET BY MOUTH DAILY 10/02/17   Comer, Belia Hemanobert W, MD  elvitegravir-cobicistat-emtricitabine-tenofovir (GENVOYA) 150-150-200-10 MG TABS tablet Take 1 tablet by mouth daily. 12/22/17   Kuppelweiser, Wilford Sportsassie  L, RPH-CPP  gabapentin (NEURONTIN) 300 MG capsule Take 1 capsule (300 mg total) by mouth at bedtime. 11/03/17   Albertine Grates, MD  glucose blood (TRUE METRIX BLOOD GLUCOSE TEST) test strip Use as instructed 11/03/17   Albertine Grates, MD  HYDROcodone-acetaminophen (NORCO/VICODIN) 5-325 MG tablet Take 1 tablet by mouth 4 (four) times daily as needed. 02/16/18   Isa Rankin, MD  insulin aspart protamine- aspart (NOVOLOG MIX 70/30) (70-30) 100 UNIT/ML injection Inject 0.3-0.4 mLs (30-40 Units total) into the skin 2 (two) times daily with a meal. 40 units  in the evening 11/03/17   Albertine Grates, MD  Insulin Syringe-Needle U-100 (INSULIN SYRINGE 1CC/31GX5/16") 31G X 5/16" 1 ML MISC Use 3 times per day to inject insulin subcutaneous.  Dx code: 250.01 12/19/12   Linward Headland, MD  polyethylene glycol [facility] / Ethelene Hal) packet Take 17 g by mouth daily. 11/03/17   Albertine Grates, MD  sulfamethoxazole-trimethoprim (BACTRIM DS,SEPTRA DS) 800-160 MG tablet Take 1 tablet by mouth 2 (two) times daily for 7 days. 02/16/18 02/23/18  Isa Rankin, MD  TRUEPLUS LANCETS 28G MISC USE AS DIRECTED 11/12/14   Quentin Angst, MD    Family History Family History  Problem Relation Age of Onset  . Thyroid disease Mother   . Diabetes Mother   . Breast cancer Maternal Grandmother   . Diabetes Maternal Grandmother     Social History Social History   Tobacco Use  . Smoking status: Former Smoker    Types: Cigarettes    Start date: 09/29/2015  . Smokeless tobacco: Never Used  . Tobacco comment: 11/01/2017  "2 cigarettes in ~ 6 month "  Substance Use Topics  . Alcohol use: Yes    Alcohol/week: 7.0 standard drinks    Types: 7 Cans of beer per week  . Drug use: Yes    Frequency: 2.0 times per week    Types: Marijuana    Comment: 11/01/2017  "weekly"     Allergies   Shellfish allergy   Review of Systems Review of Systems  All other systems reviewed and are negative.    Physical Exam Triage Vital Signs ED Triage Vitals [02/16/18 1104]  Enc Vitals Group     BP (!) 181/99     Pulse Rate 78     Resp 18     Temp 98.5 F (36.9 C)     Temp Source Oral     SpO2 100 %     Weight      Height      Pain Score 7     Pain Loc    Updated Vital Signs BP (!) 181/99 (BP Location: Right Arm)   Pulse 78   Temp 98.5 F (36.9 C) (Oral)   Resp 18   SpO2 100%  Physical Exam Vitals signs and nursing note reviewed.  Constitutional:      General: He is not in acute distress.    Comments: Alert, nicely groomed  HENT:     Head: Atraumatic.  Eyes:      Comments: Conjugate gaze, no eye redness/drainage  Neck:     Musculoskeletal: Neck supple.  Cardiovascular:     Rate and Rhythm: Normal rate.  Pulmonary:     Effort: No respiratory distress.  Abdominal:     General: There is no distension.  Musculoskeletal: Normal range of motion.  Skin:    General: Skin is warm and dry.     Comments: No cyanosis 1.5 inch markedly tender swelling  in the central forehead at the hairline, with central area that is pointing/necrotic.  Scant purulent material is expressible  Neurological:     Mental Status: He is alert and oriented to person, place, and time.      UC Treatments / Results   Procedures Incision and Drainage Date/Time: 02/16/2018 12:16 PM Performed by: Isa RankinMurray, Tabatha Razzano Wilson, MD Authorized by: Isa RankinMurray, Adrieana Fennelly Wilson, MD   Consent:    Consent obtained:  Verbal   Consent given by:  Patient Location:    Type:  Abscess   Size:  4 cm   Location:  Head   Head/neck location: Forehead. Pre-procedure details:    Skin preparation:  Hibiclens Anesthesia (see MAR for exact dosages):    Anesthesia method:  Local infiltration   Local anesthetic:  Lidocaine 2% w/o epi Procedure type:    Complexity:  Complex Procedure details:    Incision types:  Single straight   Incision depth:  Fascia   Scalpel blade:  11   Wound management:  Probed and deloculated and debrided (A large amount of necrotic eschar was present and this was sharply debrided.)   Drainage:  Purulent and bloody   Drainage amount:  Scant   Wound treatment:  Wound left open Post-procedure details:    Patient tolerance of procedure:  Tolerated well, no immediate complications   Final Clinical Impressions(s) / UC Diagnoses   Final diagnoses:  Cutaneous abscess of face  HIV disease diabetes   Discharge Instructions     Boil on forehead was debrided today:  dead tissue was removed to allow healing.  You may be sore this evening. Anticipate healing of wound over the next  couple weeks.  Wash gently with soap/water 1-2 times daily, apply antibiotic ointment and bandage.  Recheck if any increasing redness/swelling/pain/drainage or new fever>100.5, or if pain/swelling not starting to improve in a few days.  Prescriptions for cephalexin, trimethoprim/sulfa, and mupirocin ointment (antibiotics) and a small number of vicodin (#6 for severe pain) were sent to the pharmacy.  Ibuprofen or aleve otc will also help reduce pain while healing occurs.     ED Prescriptions    Medication Sig Dispense Auth. Provider   sulfamethoxazole-trimethoprim (BACTRIM DS,SEPTRA DS) 800-160 MG tablet Take 1 tablet by mouth 2 (two) times daily for 7 days. 14 tablet Isa RankinMurray, Bryttani Blew Wilson, MD   cephALEXin (KEFLEX) 500 MG capsule Take 1 capsule (500 mg total) by mouth 2 (two) times daily for 7 days. 14 capsule Isa RankinMurray, Dayten Juba Wilson, MD   HYDROcodone-acetaminophen (NORCO/VICODIN) 5-325 MG tablet Take 1 tablet by mouth 4 (four) times daily as needed. 6 tablet Isa RankinMurray, Marlea Gambill Wilson, MD     Controlled Substance Prescriptions [place] Controlled Substance Registry consulted? Yes, I have consulted the [place] Controlled Substances Registry for this patient, and feel the risk/benefit ratio today is favorable for proceeding with this prescription for a controlled substance.   Isa RankinMurray, Daniela Hernan Wilson, MD 02/19/18 1001

## 2018-02-26 ENCOUNTER — Other Ambulatory Visit: Payer: Self-pay | Admitting: Internal Medicine

## 2018-02-26 DIAGNOSIS — B20 Human immunodeficiency virus [HIV] disease: Secondary | ICD-10-CM

## 2018-03-16 ENCOUNTER — Other Ambulatory Visit: Payer: Self-pay | Admitting: Internal Medicine

## 2018-04-10 ENCOUNTER — Other Ambulatory Visit: Payer: Self-pay | Admitting: Internal Medicine

## 2018-04-17 ENCOUNTER — Ambulatory Visit (INDEPENDENT_AMBULATORY_CARE_PROVIDER_SITE_OTHER): Payer: Self-pay | Admitting: Internal Medicine

## 2018-04-17 ENCOUNTER — Encounter: Payer: Self-pay | Admitting: Infectious Diseases

## 2018-04-17 ENCOUNTER — Encounter: Payer: Self-pay | Admitting: Internal Medicine

## 2018-04-17 ENCOUNTER — Ambulatory Visit (INDEPENDENT_AMBULATORY_CARE_PROVIDER_SITE_OTHER): Payer: Self-pay | Admitting: Licensed Clinical Social Worker

## 2018-04-17 DIAGNOSIS — Z79899 Other long term (current) drug therapy: Secondary | ICD-10-CM

## 2018-04-17 DIAGNOSIS — Z915 Personal history of self-harm: Secondary | ICD-10-CM

## 2018-04-17 DIAGNOSIS — F329 Major depressive disorder, single episode, unspecified: Secondary | ICD-10-CM

## 2018-04-17 DIAGNOSIS — Z7189 Other specified counseling: Secondary | ICD-10-CM

## 2018-04-17 DIAGNOSIS — Z113 Encounter for screening for infections with a predominantly sexual mode of transmission: Secondary | ICD-10-CM

## 2018-04-17 DIAGNOSIS — F339 Major depressive disorder, recurrent, unspecified: Secondary | ICD-10-CM

## 2018-04-17 DIAGNOSIS — F332 Major depressive disorder, recurrent severe without psychotic features: Secondary | ICD-10-CM

## 2018-04-17 DIAGNOSIS — F32A Depression, unspecified: Secondary | ICD-10-CM

## 2018-04-17 DIAGNOSIS — B20 Human immunodeficiency virus [HIV] disease: Secondary | ICD-10-CM

## 2018-04-17 DIAGNOSIS — Z7185 Encounter for immunization safety counseling: Secondary | ICD-10-CM

## 2018-04-17 DIAGNOSIS — R45851 Suicidal ideations: Secondary | ICD-10-CM

## 2018-04-17 DIAGNOSIS — Z23 Encounter for immunization: Secondary | ICD-10-CM

## 2018-04-17 NOTE — Assessment & Plan Note (Signed)
Will screen today 

## 2018-04-17 NOTE — Assessment & Plan Note (Signed)
Ongoing issues due to depression, stress and situational.  Will get him in to our counselor, also with [facility].

## 2018-04-17 NOTE — Assessment & Plan Note (Signed)
Discussed Menveo, Prevnar and given today

## 2018-04-17 NOTE — Progress Notes (Signed)
   Subjective:    Patient ID: [name], male    DOB: March 03, 1985, 33 y.o.   MRN: [id number]  HPI Here for a follow up visit.   He was last seen in April 2019 and has been on [place].  HMAP expired in the Spring and he was out of medication for about 1 month but back on it since.  Labs in October 2019 with a CD4 of 610, viral load < 20.  Some issues with depression, housing stability.  No associated weight loss, no diarrhea.  No current sexual activity.    Review of Systems  Constitutional: Negative for fatigue.  Gastrointestinal: Negative for diarrhea and nausea.  Neurological: Negative for dizziness.       Objective:   Physical Exam Constitutional:      Appearance: Normal appearance.  HENT:     Mouth/Throat:     Pharynx: No posterior oropharyngeal erythema.  Eyes:     General: No scleral icterus. Cardiovascular:     Rate and Rhythm: Normal rate and regular rhythm.     Heart sounds: No murmur.  Pulmonary:     Effort: Pulmonary effort is normal.     Breath sounds: Normal breath sounds.  Musculoskeletal:     Right lower leg: No edema.     Left lower leg: No edema.  Neurological:     Mental Status: He is alert.    SH: + tobacco       Assessment & Plan:

## 2018-04-17 NOTE — Addendum Note (Signed)
Addended by: Andree Coss on: 04/17/2018 02:11 PM   Modules accepted: Orders

## 2018-04-17 NOTE — Assessment & Plan Note (Signed)
No issues with medication, will check again today. rtc 4 months.

## 2018-04-18 LAB — T-HELPER CELL (CD4) - (RCID CLINIC ONLY)
CD4 % Helper T Cell: 40 % (ref 33–55)
CD4 T CELL ABS: 710 /uL (ref 400–2700)

## 2018-04-19 LAB — CYTOLOGY, (ORAL, ANAL, URETHRAL) ANCILLARY ONLY
CHLAMYDIA, DNA PROBE: NEGATIVE
Chlamydia: NEGATIVE
NEISSERIA GONORRHEA: NEGATIVE
Neisseria Gonorrhea: NEGATIVE

## 2018-04-19 LAB — URINE CYTOLOGY ANCILLARY ONLY
Chlamydia: NEGATIVE
Neisseria Gonorrhea: NEGATIVE

## 2018-04-20 ENCOUNTER — Other Ambulatory Visit: Payer: Self-pay

## 2018-04-20 ENCOUNTER — Ambulatory Visit: Payer: No Typology Code available for payment source | Admitting: Family Medicine

## 2018-04-20 ENCOUNTER — Encounter (HOSPITAL_COMMUNITY): Payer: Self-pay | Admitting: *Deleted

## 2018-04-20 ENCOUNTER — Observation Stay (HOSPITAL_COMMUNITY)
Admission: EM | Admit: 2018-04-20 | Discharge: 2018-04-21 | Disposition: A | Discharge status: Home / Self Care | Payer: No Typology Code available for payment source | Attending: Internal Medicine | Admitting: Internal Medicine

## 2018-04-20 DIAGNOSIS — Z720 Tobacco use: Secondary | ICD-10-CM

## 2018-04-20 DIAGNOSIS — E1110 Type 2 diabetes mellitus with ketoacidosis without coma: Secondary | ICD-10-CM | POA: Diagnosis present

## 2018-04-20 DIAGNOSIS — R079 Chest pain, unspecified: Secondary | ICD-10-CM

## 2018-04-20 DIAGNOSIS — Z794 Long term (current) use of insulin: Secondary | ICD-10-CM | POA: Insufficient documentation

## 2018-04-20 DIAGNOSIS — Z598 Other problems related to housing and economic circumstances: Secondary | ICD-10-CM

## 2018-04-20 DIAGNOSIS — Z66 Do not resuscitate: Secondary | ICD-10-CM

## 2018-04-20 DIAGNOSIS — E871 Hypo-osmolality and hyponatremia: Secondary | ICD-10-CM

## 2018-04-20 DIAGNOSIS — E1010 Type 1 diabetes mellitus with ketoacidosis without coma: Principal | ICD-10-CM | POA: Insufficient documentation

## 2018-04-20 DIAGNOSIS — J45909 Unspecified asthma, uncomplicated: Secondary | ICD-10-CM | POA: Insufficient documentation

## 2018-04-20 DIAGNOSIS — Z7982 Long term (current) use of aspirin: Secondary | ICD-10-CM | POA: Insufficient documentation

## 2018-04-20 DIAGNOSIS — Z79899 Other long term (current) drug therapy: Secondary | ICD-10-CM | POA: Insufficient documentation

## 2018-04-20 DIAGNOSIS — Z87891 Personal history of nicotine dependence: Secondary | ICD-10-CM | POA: Insufficient documentation

## 2018-04-20 DIAGNOSIS — Z9114 Patient's other noncompliance with medication regimen: Secondary | ICD-10-CM

## 2018-04-20 DIAGNOSIS — Z91013 Allergy to seafood: Secondary | ICD-10-CM

## 2018-04-20 DIAGNOSIS — Z21 Asymptomatic human immunodeficiency virus [HIV] infection status: Secondary | ICD-10-CM

## 2018-04-20 DIAGNOSIS — Z87311 Personal history of (healed) other pathological fracture: Secondary | ICD-10-CM

## 2018-04-20 LAB — POCT I-STAT EG7
Acid-base deficit: 2 mmol/L (ref 0.0–2.0)
Bicarbonate: 22.8 mmol/L (ref 20.0–28.0)
Calcium, Ion: 1.24 mmol/L (ref 1.15–1.40)
HEMATOCRIT: 46 % (ref 39.0–52.0)
Hemoglobin: 15.6 g/dL (ref 13.0–17.0)
O2 Saturation: 89 %
Potassium: 5.1 mmol/L (ref 3.5–5.1)
Sodium: 125 mmol/L — ABNORMAL LOW (ref 135–145)
TCO2: 24 mmol/L (ref 22–32)
pCO2, Ven: 40.1 mmHg — ABNORMAL LOW (ref 44.0–60.0)
pH, Ven: 7.363 (ref 7.250–7.430)
pO2, Ven: 58 mmHg — ABNORMAL HIGH (ref 32.0–45.0)

## 2018-04-20 LAB — GLUCOSE, CAPILLARY
Glucose-Capillary: 113 mg/dL — ABNORMAL HIGH (ref 70–99)
Glucose-Capillary: 131 mg/dL — ABNORMAL HIGH (ref 70–99)
Glucose-Capillary: 167 mg/dL — ABNORMAL HIGH (ref 70–99)
Glucose-Capillary: 244 mg/dL — ABNORMAL HIGH (ref 70–99)
Glucose-Capillary: 324 mg/dL — ABNORMAL HIGH (ref 70–99)

## 2018-04-20 LAB — URINALYSIS, ROUTINE W REFLEX MICROSCOPIC
BACTERIA UA: NONE SEEN
Bilirubin Urine: NEGATIVE
Glucose, UA: >=500 mg/dL — AB
Hgb urine dipstick: NEGATIVE
Ketones, ur: 20 mg/dL — AB
Leukocytes,Ua: NEGATIVE
Nitrite: NEGATIVE
Protein, ur: NEGATIVE mg/dL
Specific Gravity, Urine: 1.025 (ref 1.005–1.030)
pH: 5 (ref 5.0–8.0)

## 2018-04-20 LAB — RAPID URINE DRUG SCREEN, HOSP PERFORMED
AMPHETAMINES: NOT DETECTED
BENZODIAZEPINES: NOT DETECTED
Barbiturates: NOT DETECTED
Cocaine: NOT DETECTED
Opiates: NOT DETECTED
TETRAHYDROCANNABINOL: NOT DETECTED

## 2018-04-20 LAB — CBC
HCT: 45.6 % (ref 39.0–52.0)
Hemoglobin: 15.1 g/dL (ref 13.0–17.0)
MCH: 29.8 pg (ref 26.0–34.0)
MCHC: 33.1 g/dL (ref 30.0–36.0)
MCV: 90.1 fL (ref 80.0–100.0)
Platelets: 238 10*3/uL (ref 150–400)
RBC: 5.06 MIL/uL (ref 4.22–5.81)
RDW: 13.8 % (ref 11.5–15.5)
WBC: 5.1 10*3/uL (ref 4.0–10.5)
nRBC: 0 % (ref 0.0–0.2)

## 2018-04-20 LAB — CBG MONITORING, ED
Glucose-Capillary: >600 mg/dL (ref 70–99)
Glucose-Capillary: >600 mg/dL (ref 70–99)
Glucose-Capillary: 521 mg/dL (ref 70–99)
Glucose-Capillary: 499 mg/dL — ABNORMAL HIGH (ref 70–99)

## 2018-04-20 LAB — BASIC METABOLIC PANEL
ANION GAP: 8 (ref 5–15)
BUN: 29 mg/dL — ABNORMAL HIGH (ref 6–20)
CHLORIDE: 101 mmol/L (ref 98–111)
CO2: 24 mmol/L (ref 22–32)
Calcium: 8.9 mg/dL (ref 8.9–10.3)
Creatinine, Ser: 1.18 mg/dL (ref 0.61–1.24)
GFR calc Af Amer: >60 mL/min (ref >60)
GFR calc non Af Amer: >60 mL/min (ref >60)
Glucose, Bld: 235 mg/dL — ABNORMAL HIGH (ref 70–99)
Potassium: 3.5 mmol/L (ref 3.5–5.1)
Sodium: 133 mmol/L — ABNORMAL LOW (ref 135–145)

## 2018-04-20 LAB — COMPREHENSIVE METABOLIC PANEL
ALT: 16 U/L (ref 0–44)
AST: 21 U/L (ref 15–41)
Albumin: 4 g/dL (ref 3.5–5.0)
Alkaline Phosphatase: 119 U/L (ref 38–126)
Anion gap: 16 — ABNORMAL HIGH (ref 5–15)
BUN: 29 mg/dL — AB (ref 6–20)
CO2: 20 mmol/L — AB (ref 22–32)
Calcium: 9.5 mg/dL (ref 8.9–10.3)
Chloride: 90 mmol/L — ABNORMAL LOW (ref 98–111)
Creatinine, Ser: 1.61 mg/dL — ABNORMAL HIGH (ref 0.61–1.24)
GFR calc Af Amer: >60 mL/min (ref >60)
GFR calc non Af Amer: 56 mL/min — ABNORMAL LOW (ref >60)
GLUCOSE: 832 mg/dL — AB (ref 70–99)
Potassium: 5.6 mmol/L — ABNORMAL HIGH (ref 3.5–5.1)
SODIUM: 126 mmol/L — AB (ref 135–145)
Total Bilirubin: 1.3 mg/dL — ABNORMAL HIGH (ref 0.3–1.2)
Total Protein: 7.4 g/dL (ref 6.5–8.1)

## 2018-04-20 LAB — LACTIC ACID, PLASMA
Lactic Acid, Venous: 1.2 mmol/L (ref 0.5–1.9)
Lactic Acid, Venous: 1.1 mmol/L (ref 0.5–1.9)

## 2018-04-20 LAB — LIPASE, BLOOD: Lipase: 17 U/L (ref 11–51)

## 2018-04-20 MED ORDER — ONDANSETRON HCL 4 MG/2ML IJ SOLN: 4.0000 mg | Freq: Four times a day (QID) | INTRAMUSCULAR | Status: DC | PRN

## 2018-04-20 MED ORDER — ACETAMINOPHEN 650 MG RE SUPP: 650.0000 mg | Freq: Four times a day (QID) | RECTAL | Status: DC | PRN

## 2018-04-20 MED ORDER — ENOXAPARIN SODIUM 40 MG/0.4ML ~~LOC~~ SOLN
40.0000 mg | SUBCUTANEOUS | Status: DC
  Administered 2018-04-20: 40 mg via SUBCUTANEOUS
  Filled 2018-04-20: qty 0.4

## 2018-04-20 MED ORDER — POTASSIUM CHLORIDE 10 MEQ/100ML IV SOLN
10.0000 meq | INTRAVENOUS | Status: DC
  Administered 2018-04-20: 10 meq via INTRAVENOUS
  Filled 2018-04-20: qty 100

## 2018-04-20 MED ORDER — ONDANSETRON HCL 4 MG PO TABS: 4.0000 mg | ORAL_TABLET | Freq: Four times a day (QID) | ORAL | Status: DC | PRN

## 2018-04-20 MED ORDER — DEXTROSE-NACL 5-0.45 % IV SOLN: INTRAVENOUS | Status: DC

## 2018-04-20 MED ORDER — SODIUM CHLORIDE 0.9 % IV BOLUS
1000.0000 mL | Freq: Once | INTRAVENOUS | Status: AC
  Administered 2018-04-20: 1000 mL via INTRAVENOUS

## 2018-04-20 MED ORDER — SODIUM CHLORIDE 0.9 % IV SOLN
INTRAVENOUS | Status: AC
  Administered 2018-04-20: 17:00:00 via INTRAVENOUS

## 2018-04-20 MED ORDER — ELVITEG-COBIC-EMTRICIT-TENOFAF 150-150-200-10 MG PO TABS
1.0000 | ORAL_TABLET | Freq: Every day | ORAL | Status: DC
  Administered 2018-04-20 – 2018-04-21 (×2): 1 via ORAL
  Filled 2018-04-20 (×3): qty 1

## 2018-04-20 MED ORDER — MULTI-VITAMIN/MINERALS PO TABS: 1.0000 | ORAL_TABLET | Freq: Every day | ORAL | Status: DC

## 2018-04-20 MED ORDER — ACETAMINOPHEN 325 MG PO TABS: 650.0000 mg | ORAL_TABLET | Freq: Four times a day (QID) | ORAL | Status: DC | PRN

## 2018-04-20 MED ORDER — INSULIN REGULAR(HUMAN) IN NACL 100-0.9 UT/100ML-% IV SOLN
INTRAVENOUS | Status: DC
  Administered 2018-04-20: 5.4 [IU]/h via INTRAVENOUS
  Filled 2018-04-20: qty 100

## 2018-04-20 MED ORDER — SODIUM CHLORIDE 0.9 % IV SOLN
INTRAVENOUS | Status: DC
  Administered 2018-04-20: 17:00:00 via INTRAVENOUS

## 2018-04-20 MED ORDER — DEXTROSE-NACL 5-0.45 % IV SOLN
INTRAVENOUS | Status: AC
  Administered 2018-04-20 – 2018-04-21 (×3): via INTRAVENOUS

## 2018-04-20 MED ORDER — ADULT MULTIVITAMIN W/MINERALS CH
1.0000 | ORAL_TABLET | Freq: Every day | ORAL | Status: DC
  Administered 2018-04-21: 1 via ORAL
  Filled 2018-04-20: qty 1

## 2018-04-20 NOTE — Progress Notes (Signed)
Pt admitted from ED with the diagnosis of DKA, alert and oriented, denies any pain at this time, pt on insulin drip, settled in bed with call light at bedside, tele monitor put and verified on pt, was however reassured and will continue to monitor, safety concern addressed accordingly, v/s stable. Obasogie-Asidi, Neva Ramaswamy Efe

## 2018-04-20 NOTE — Progress Notes (Signed)
Paged MD regarding pts CBG to be 167, still on glucose stabilizer at 2.7, stated to continue with and will notify when to stop th infusion.

## 2018-04-20 NOTE — ED Notes (Signed)
Dr. Frances Furbish advised to discontinue continuous sodium chloride infusion at a rate of 999 mL/hr after 2 hrs.

## 2018-04-20 NOTE — H&P (Signed)
Date: 04/20/2018               Patient Name:  [name] MRN: [id number]  DOB: 03/27/1985 Age / Sex: 33 y.o., male   PCP: Hilbert CorriganPlacey, Chales AbrahamsMary Ann, NP         Medical Service: Internal Medicine Teaching Service         Attending Physician: Dr. Doneen PoissonKlima, Lawrence, MD    First Contact: Dr. Gwyneth RevelsKrienke Pager: [phone]  Second Contact: Dr. Frances FurbishWinfrey Pager: [phone]       After Hours (After 5p/  First Contact Pager: [phone]  weekends / holidays): Second Contact Pager: [phone]   Chief Complaint: Elevated blood sugars  History of Present Illness: This is a 33 year old male with a history of HIV, type 1 DM (diagnosed in childhood) who presented with nausea, vomiting, abdominal pain, decreased energy, decreased appetite, increased thirst and urination after running out of insulin 4 days ago. He reports that he was trying to get a PCP arranged but was unable to find anyone, he last went to the public health building for his insulin and he was only using a sliding scale. He last checked his blood sugar about 3 days ago and it was around 250. He reports that he has had multiple hospitalizations for diabetes before, he states that his diabetes has never been under good control. He also reports some chest pain, it is a burning sensation that is over the middle of the chest, he reports it always occurs when his blood sugars are too high, he denies any relation to activity, he reports that he does not have any at this time. He denies any other symptoms, including fevers, chills, diarrhea, constipation, skin wounds, headaches, or dizziness. He denies any recent changes in his diet or recent sick contacts. He has a history of HIV and reports that he last saw his ID doctor about 3 days ago, has been taking his Genvoya as prescribed. Last CD4 count was 710, Viral load still pending.   In the ED he was found to be afebrile, tachycardic to 101, hypertensive to 182/116, normal RR 16. Labs significant for glucose >600.   Urinalysis showed glucose>500, ketones 20, nitrate and leukocyte esterase negative.  UDS was negative.  Lipase was 1.2.  CBC was unremarkable.  CMP showed a sodium 126, K5.6, CO2 20, glucose 832, creatinine 1.6(baseline around 1.0-1.2), AG 16.  EKG notes sinus rhythm, mild ST elevation in V3 and V4, likely normal early repolarization.  He was given 1 L normal saline and started on insulin drip.  He was admitted to internal medicine.  Meds:  Current Meds  Medication Sig  . acetaminophen (TYLENOL) 500 MG tablet Take 1 tablet (500 mg total) by mouth every 6 (six) hours as needed.  . Camphor-Menthol (TIGER BALM EXTRA STRENGTH) 11-10 % OINT Apply 1 application topically as needed (leg pain).  . citalopram (CELEXA) 20 MG tablet TAKE 1 TABLET BY MOUTH DAILY. CALL OFFICE TO SCHEDULE VISIT FOR FUTURE REFILLS [phone] (Patient taking differently: Take 20 mg by mouth daily. )  . feeding supplement (BOOST HIGH PROTEIN) LIQD Take 1 Container by mouth 2 (two) times daily between meals.  . GENVOYA 150-150-200-10 MG TABS tablet TAKE 1 TABLET BY MOUTH DAILY  . ibuprofen (ADVIL,MOTRIN) 200 MG tablet Take 600 mg by mouth every 6 (six) hours as needed for moderate pain.  Marland Kitchen. insulin aspart protamine- aspart (NOVOLOG MIX 70/30) (70-30) 100 UNIT/ML injection Inject 0.3-0.4 mLs (30-40 Units total) into the  skin 2 (two) times daily with a meal. 40 units in the evening  . Multiple Vitamins-Minerals (MULTIVITAMIN WITH MINERALS) tablet Take 1 tablet by mouth daily.     Allergies: Allergies as of 04/20/2018 - Review Complete 04/20/2018  Allergen Reaction Noted  . Shellfish allergy Anaphylaxis and Hives 04/13/2012   Past Medical History:  Diagnosis Date  . Anxiety   . Asthma    no prior hospitalizations, intubations  . Depression   . Headache    "only w/stress" (11/01/2017)  . HIV (human immunodeficiency virus infection) (HCC)   . Migraine    "a few/month" (11/01/2017)  . Refusal of blood transfusions as patient is  Jehovah's Witness   . Seizure (HCC)    "only w/my low blood sugars"  (11/01/2017)  . Type I diabetes mellitus (HCC)     Family History: Diabetes type 2 in mother and paternal grandmother.  Type 1 diabetes in maternal uncle.  Mother has a history of breast cancer and a brain cancer.  No other cancers in the family.  Social History: He smokes occasionally, a couple cigerretes every few weeks.  Denies any significant alcohol use, does not know when his last drink was.  Smokes marijuana seldomly.  Denies any IV drug use.  Currently lives in a shelter, he had a surgery last year on his right leg. He reports relatives in the area but denies any family.   Review of Systems: A complete ROS was negative except as per HPI.   Physical Exam: Blood pressure (!) 149/102, pulse 85, temperature 99.8 F (37.7 C), resp. rate 17, height 6' (1.829 m), weight 87.1 kg, SpO2 99 %. Physical Exam  Constitutional: He is oriented to person, place, and time.  Fatigued appearing male, NAD  HENT:  Head: Normocephalic and atraumatic.  Eyes: Pupils are equal, round, and reactive to light. EOM are normal.  Mild scleral icterus  Neck: Normal range of motion. Neck supple. No thyromegaly present.  Cardiovascular: Regular rhythm, normal heart sounds and intact distal pulses.  Tachycardic  Pulmonary/Chest: Effort normal and breath sounds normal. No respiratory distress.  Abdominal: Soft. Bowel sounds are normal. There is abdominal tenderness (minimal TTP in epigastric area). There is no rebound and no guarding.  Musculoskeletal: Normal range of motion.     Comments: Right lower extremity: scarring on medial ankle, normal ROM  Neurological: He is alert and oriented to person, place, and time. No cranial nerve deficit.  Decreased sensation over right 3-5 toes and lateral aspect of right shin  Skin: Skin is warm. He is diaphoretic.  Psychiatric:  Normal affect, depressed mood   EKG: personally reviewed my interpretation is  NSR, minimal ST elevations in V3 and V4, T wave infections in V1  Assessment & Plan by Problem: Active Problems:   DKA (diabetic ketoacidoses) (HCC)  Diabetic ketoacidosis: This is a 33 year old male with history of uncontrolled diabetes who resented with a 4-day history of abdominal pain, nausea, vomiting, decreased appetite, polyuria, polydipsia and decreased energy after not taking any insulin for 4 days.  He was found to have hyperglycemia to 832, glucosuria and ketonuria, as well as an anion gap metabolic acidosis. Na 126, K 5.6. Lipase and LA was normal. VBG showed pH 7.3, pCO2 40, and pO2 58. On exam he was tachycardiac and hypertensive, he was also fatigued appearing, diaphoretic, and had some abdominal tenderness to palpation. This is most likely related to his lack of medication, however other possibilities including infection, ischemia, intoxications or ingestions, these appear  less likely. He denied any recent alcohol or drug use, denied any other ingestions or intoxications.  He also denied any fevers, chills, or other sick contacts, he had no leukocytosis making infection a less likely cause. He has not been able to find a PCP and it appears that his social situation is a limiting factor in him getting better control of his diabetes. He was started on insulin drip and given 1L NS in the ED.  -Continue insulin drip -Monitor AG on BMP, when AG closes x2 can transition to oral insulin -Continue maintenance fluids,  NS at 125 mL/hr -BMP q4 hours -Telemetry -Zofran PRN for nausea -Monitor K on BMP and replete as needed, given his initial K won't give K at this time -Check A1c -Keep NPO for now -Admit to progressive  Chest pain: -He reports that he was having some mild chest pain, located over substernal area that was burning in nature. Denied any chest pain at this time. He reported that it often occurred when he had elevated blood sugars. Denied any relationship to exertion or  improvement with rest. His EKG was unchanged from prior. This appears more atypical in nature. Most likely MSK in setting of dehydration and acute illness.  -Continue to monitor for now  Hyponatremia: -Na down to 126 on admission, corrected Na of 138. Most likely in setting of DKA.  -Repeat BMP q4 hours  HIV: -Follows with Dr. Luciana Axe, last saw him on 2/18. CD4 at that time was 710. Viral load is still pending. He reports that he has been taking his Genvoya as prescribed, denies missing any doses. Hepatic labs were unremarkable on admission. Appears to be well controlled.  -Continue Genvoya -F/u viral load  History of right tibia and fibular fractures s/p ORIF: -He reports decreased sensation in his right foot which has been chronic, denies any significant pain. On exam, there is no erythema, edema, or tenderness around the area. He reports that he does not take any pain medications at home.  -Tylenol PRN for pain control  FEN: Insulin drip, NS @ 125 cc/hr, replete lytes prn, NPO VTE ppx: Lovenox  Code Status: DNR -Patient has full understanding of what this means, he reported that he would want to just go peacefully   Dispo: Admit patient to Observation with expected length of stay less than 2 midnights.  Signed: Claudean Severance, MD 04/20/2018, 5:40 PM  Pager: [phone]

## 2018-04-20 NOTE — ED Notes (Signed)
35M unable to take pt due to insulin drip. Bed placement contacted by unit.

## 2018-04-20 NOTE — ED Triage Notes (Signed)
States his sugar is up and he hasn't had his insulin in 4 days, states he had difficulty getting pcp. States he feels bad.

## 2018-04-20 NOTE — ED Notes (Signed)
2nd 1000 mL bolus administered.

## 2018-04-20 NOTE — BH Specialist Note (Signed)
Integrated Behavioral Health Initial Visit  MRN: [id number] Name: [name]  Number of Integrated Behavioral Health Clinician visits:: 1/6 Session Start time: 12:00pm  Session End time: 1:00pm Total time: 1 hour  Type of Service: Integrated Behavioral Health- Individual/Family Interpretor:No. Interpretor Name and Language: n/a   Warm Hand Off Completed.       SUBJECTIVE: [name] is a 33 y.o. male accompanied by self Patient was referred by Dr Luciana Axe for depression and suicidal thoughts. Patient reports the following symptoms/concerns: depressed mood, flat affect, lack of motivation and energy, crying spells, anhedonia, irritability, thoughts of death and dying, feelings of hopelessness, excessive guilt Duration of problem: unknown; Severity of problem: severe  OBJECTIVE: Mood: Depressed and Affect: Tearful Risk of harm to self or others: Suicidal ideation, past attempts but no current plan  LIFE CONTEXT: Patient currently has a place to live and is working part time. He reports that outside of that he does not do much or see much point in doing anything. Patient reports that he has a few friends, but doesn't really trust anyone so it is hard to say how supportive they are.  GOALS ADDRESSED: Patient will: 1. Reduce symptoms of: depression  INTERVENTIONS: Interventions utilized: Motivational Interviewing, Solution-Focused Strategies and Supportive Counseling   ASSESSMENT: Patient currently experiencing depressed mood, flat affect, lack of motivation and energy, crying spells, anhedonia, irritability, thoughts of death and dying, feelings of hopelessness, excessive guilt. He reports that everything feels pointless and he would Adams to not live anymore, but does not know a way to kill himself that would guarantee lack of suffering. Currently he has no plan to harm himself in any way. The most accurate diagnosis for his symptoms at this time is Major Depressive Disorder,  Recurrent, Severe. Crisis contact information was given, and safety planning discussed.  Patient shares that he has been depressed for many years, but it seems to be getting worse as time goes on. He states that he cannot see things getting better and just wants to not hurt or be disappointed anymore. Counselor explored with patient ways that he can find hope, but he was dismissive saying that he has heard it all before. Patient and counselor discussed any goals that patient has that he sees as possible to achieve. Counselor educated patient on counseling services at [facility], including scope of practice, financial accessibility and scheduling. Patient is extremely hesitant to engage in counseling as he reports having bad experiences with counselors in the past. Counselor guided patient in exploring the overall goals of counseling and how it might be helpful to him. Nurse, Micael Hampshire, joined session and discussed with patient his goals for himself. She encouraged patient to pursue counseling as a way to make progress on these. Counselor challenged patient to open himself to the possibliity that he might be capable of making his life better. Patient stated that he is still not sold on counseling, but that he will give it a chance. However, he did not make a follow-up appointment today.    Patient may benefit from weekly CBT sessions.  PLAN: 1. Patient states he will call to make follow-up appointment.  Angus Palms, LCSW

## 2018-04-20 NOTE — ED Provider Notes (Addendum)
[facility] EMERGENCY DEPARTMENT Provider Note   CSN: [id number] Arrival date & time: 04/20/18  1343    History   Chief Complaint Chief Complaint  Patient presents with  . Hyperglycemia    HPI [name] is a 33 y.o. male.     HPI Patient reports he has not taken his insulin for approximately 4 days.  He reports he has been getting increasingly fatigued and nauseated.  He reports he is getting lightheaded.  He endorses frequent urination.  No known fever.  No vomiting but nauseated. Past Medical History:  Diagnosis Date  . Anxiety   . Asthma    no prior hospitalizations, intubations  . Depression   . Headache    "only w/stress" (11/01/2017)  . HIV (human immunodeficiency virus infection) (HCC)   . Migraine    "a few/month" (11/01/2017)  . Refusal of blood transfusions as patient is Jehovah's Witness   . Seizure (HCC)    "only w/my low blood sugars"  (11/01/2017)  . Type I diabetes mellitus [facility])     Patient Active Problem List   Diagnosis Date Noted  . Closed fracture of base of fifth metatarsal bone of right foot 11/13/2017  . Closed displaced comminuted fracture of shaft of right tibia 11/01/2017  . Closed fracture of medial malleolus of right ankle 10/31/2017  . Vaccine counseling 12/14/2016  . Screening examination for venereal disease 06/09/2015  . Encounter for long-term (current) use of medications 06/09/2015  . Depression 03/04/2015  . Dizziness and giddiness 03/04/2015  . Tobacco dependence 11/12/2014  . Human immunodeficiency virus (HIV) disease (HCC) 11/11/2014  . Stress 12/19/2012  . Asthma 10/02/2010  . Diabetes mellitus type 1 (HCC) 03/01/1991    Past Surgical History:  Procedure Laterality Date  . FRACTURE SURGERY    . IM NAILING TIBIA Right 11/01/2017   INTRAMEDULLARY (IM) NAIL TIBIALRightGeneral  . ORIF ANKLE FRACTURE Right 11/01/2017  . ORIF ANKLE FRACTURE Right 11/01/2017   Procedure: OPEN REDUCTION INTERNAL FIXATION  (ORIF) ANKLE FRACTURE;  Surgeon: Roby Lofts, MD;  Location: [facility] OR;  Service: Orthopedics;  Laterality: Right;  . TIBIA IM NAIL INSERTION Right 11/01/2017   Procedure: INTRAMEDULLARY (IM) NAIL TIBIAL;  Surgeon: Roby Lofts, MD;  Location: [facility] OR;  Service: Orthopedics;  Laterality: Right;        Home Medications    Prior to Admission medications   Medication Sig Start Date End Date Taking? Authorizing Provider  acetaminophen (TYLENOL) 500 MG tablet Take 1 tablet (500 mg total) by mouth every 6 (six) hours as needed. 02/10/15  Yes Massie Maroon, FNP  Camphor-Menthol (TIGER BALM EXTRA STRENGTH) 11-10 % OINT Apply 1 application topically as needed (leg pain).   Yes [provider]  citalopram (CELEXA) 20 MG tablet TAKE 1 TABLET BY MOUTH DAILY. CALL OFFICE TO SCHEDULE VISIT FOR FUTURE REFILLS [phone] Patient taking differently: Take 20 mg by mouth daily.  04/10/18  Yes Comer, Belia Heman, MD  feeding supplement (BOOST HIGH PROTEIN) LIQD Take 1 Container by mouth 2 (two) times daily between meals.   Yes [provider]  GENVOYA 150-150-200-10 MG TABS tablet TAKE 1 TABLET BY MOUTH DAILY 02/26/18  Yes Comer, Belia Heman, MD  ibuprofen (ADVIL,MOTRIN) 200 MG tablet Take 600 mg by mouth every 6 (six) hours as needed for moderate pain.   Yes [provider]  insulin aspart protamine- aspart (NOVOLOG MIX 70/30) (70-30) 100 UNIT/ML injection Inject 0.3-0.4 mLs (30-40 Units total) into the skin 2 (  two) times daily with a meal. 40 units in the evening 11/03/17  Yes Albertine Grates, MD  Multiple Vitamins-Minerals (MULTIVITAMIN WITH MINERALS) tablet Take 1 tablet by mouth daily.   Yes [provider]  aspirin 325 MG EC tablet Take 1 tablet (325 mg total) by mouth daily. Patient not taking: Reported on 04/20/2018 11/04/17   Albertine Grates, MD  Blood Glucose Monitoring Suppl (TRUE METRIX AIR GLUCOSE METER) DEVI 1 each by Does not apply route 4 (four) times daily -  with meals and at  bedtime. 11/12/14   Massie Maroon, FNP  gabapentin (NEURONTIN) 300 MG capsule Take 1 capsule (300 mg total) by mouth at bedtime. Patient not taking: Reported on 04/20/2018 11/03/17   Albertine Grates, MD  glucose blood (TRUE METRIX BLOOD GLUCOSE TEST) test strip Use as instructed 11/03/17   Albertine Grates, MD  HYDROcodone-acetaminophen (NORCO/VICODIN) 5-325 MG tablet Take 1 tablet by mouth 4 (four) times daily as needed. Patient not taking: Reported on 04/20/2018 02/16/18   Isa Rankin, MD  Insulin Syringe-Needle U-100 (INSULIN SYRINGE 1CC/31GX5/16") 31G X 5/16" 1 ML MISC Use 3 times per day to inject insulin subcutaneous.  Dx code: 250.01 12/19/12   Linward Headland, MD  polyethylene glycol [facility] / Ethelene Hal) packet Take 17 g by mouth daily. Patient not taking: Reported on 04/20/2018 11/03/17   Albertine Grates, MD  TRUEPLUS LANCETS 28G MISC USE AS DIRECTED 11/12/14   Quentin Angst, MD    Family History Family History  Problem Relation Age of Onset  . Thyroid disease Mother   . Diabetes Mother   . Breast cancer Maternal Grandmother   . Diabetes Maternal Grandmother     Social History Social History   Tobacco Use  . Smoking status: Former Smoker    Types: Cigarettes    Start date: 09/29/2015  . Smokeless tobacco: Never Used  . Tobacco comment: 11/01/2017  "2 cigarettes in ~ 6 month "  Substance Use Topics  . Alcohol use: Yes    Alcohol/week: 7.0 standard drinks    Types: 7 Cans of beer per week  . Drug use: Yes    Frequency: 2.0 times per week    Types: Marijuana    Comment: 11/01/2017  "weekly"     Allergies   Shellfish allergy   Review of Systems Review of Systems 10 Systems reviewed and are negative for acute change except as noted in the HPI.   Physical Exam Updated Vital Signs BP (!) 182/116 (BP Location: Right Arm)   Pulse (!) 101   Temp 98.2 F (36.8 C) (Oral)   Resp 16   Ht 6' (1.829 m)   Wt 87.1 kg   SpO2 100%   BMI 26.04 kg/m   Physical Exam Constitutional:       Comments: Patient alert with normal mental status but uncomfortable and ill appearance.  Smells of ketones.  HENT:     Mouth/Throat:     Comments: Mucous membranes dry.  Airway widely patent. Eyes:     Extraocular Movements: Extraocular movements intact.     Conjunctiva/sclera: Conjunctivae normal.  Neck:     Musculoskeletal: Neck supple.  Cardiovascular:     Comments: Borderline tachycardia no gross rub murmur gallop.  Distal pulses intact. Pulmonary:     Comments: No respiratory distress.  Lungs clear to auscultation. Abdominal:     Comments: No guarding.  Patient endorses diffuse mild discomfort to palpation predominantly eliciting nausea.  Musculoskeletal: Normal range of motion.  General: No swelling or tenderness.     Right lower leg: No edema.     Left lower leg: No edema.     Comments: Patient has a well-healed but complex appearing surgical repair of right ankle.  He reports he chronically has some numbness and pain associated with this.  Otherwise there are no acute appearing wounds or lesions of cellulitis of the lower extremities.  Skin:    General: Skin is warm and dry.  Neurological:     General: No focal deficit present.     Mental Status: He is oriented to person, place, and time.     Coordination: Coordination normal.  Psychiatric:        Mood and Affect: Mood normal.      ED Treatments / Results  Labs (all labs ordered are listed, but only abnormal results are displayed) Labs Reviewed  CBG MONITORING, ED - Abnormal; Notable for the following components:      Result Value   Glucose-Capillary >600 (*)    All other components within normal limits  CBC  URINALYSIS, ROUTINE W REFLEX MICROSCOPIC  COMPREHENSIVE METABOLIC PANEL  CBG MONITORING, ED    EKG EKG Interpretation  Date/Time:  Friday April 20 2018 15:19:53 EST Ventricular Rate:  91 PR Interval:    QRS Duration: 95 QT Interval:  373 QTC Calculation: 459 R Axis:   37 Text  Interpretation:  Sinus rhythm Probable left atrial enlargement ST elev, probable normal early repol pattern agree. no change from previous Confirmed by Arby BarrettePfeiffer, Pranshu Lyster [phone]([id number]) on 04/20/2018 3:49:54 PM Also confirmed by Arby BarrettePfeiffer, Sandeep Radell [phone]([id number]), editor Barbette Hairassel, Kerry [phone]([id number])  on 04/21/2018 9:12:27 AM   Radiology No results found.  Procedures Procedures (including critical care time) CRITICAL CARE Performed by: Arby BarretteMarcy Tarsha Blando   Total critical care time: 30 minutes  Critical care time was exclusive of separately billable procedures and treating other patients.  Critical care was necessary to treat or prevent imminent or life-threatening deterioration.  Critical care was time spent personally by me on the following activities: development of treatment plan with patient and/or surrogate as well as nursing, discussions with consultants, evaluation of patient's response to treatment, examination of patient, obtaining history from patient or surrogate, ordering and performing treatments and interventions, ordering and review of laboratory studies, ordering and review of radiographic studies, pulse oximetry and re-evaluation of patient's condition. Medications Ordered in ED Medications - No data to display   Initial Impression / Assessment and Plan / ED Course  I have reviewed the triage vital signs and the nursing notes.  Pertinent labs & imaging results that were available during my care of the patient were reviewed by me and considered in my medical decision making (see chart for details).       Insulin-dependent diabetic presenting with DKA.  Patient is ill in appearance but does have clear mental status.  He is not hypotensive.  Insulin protocol initiated for DKA.  Fluid resuscitation initiated.  Patient admitted to internal medicine teaching service.  Final Clinical Impressions(s) / ED Diagnoses   Final diagnoses:  Diabetic ketoacidosis without coma associated with type 1 diabetes  mellitus [facility](HCC)    ED Discharge Orders    None       Arby BarrettePfeiffer, Dianca Owensby, MD 04/21/18 1511    Arby BarrettePfeiffer, Tamu Golz, MD 04/21/18 1512

## 2018-04-21 DIAGNOSIS — Z5329 Procedure and treatment not carried out because of patient's decision for other reasons: Secondary | ICD-10-CM

## 2018-04-21 LAB — BASIC METABOLIC PANEL
Anion gap: 11 (ref 5–15)
Anion gap: 10 (ref 5–15)
Anion gap: 7 (ref 5–15)
Anion gap: 7 (ref 5–15)
BUN: 24 mg/dL — ABNORMAL HIGH (ref 6–20)
BUN: 22 mg/dL — ABNORMAL HIGH (ref 6–20)
BUN: 17 mg/dL (ref 6–20)
BUN: 14 mg/dL (ref 6–20)
CO2: 22 mmol/L (ref 22–32)
CO2: 23 mmol/L (ref 22–32)
CO2: 28 mmol/L (ref 22–32)
CO2: 27 mmol/L (ref 22–32)
Calcium: 8.8 mg/dL — ABNORMAL LOW (ref 8.9–10.3)
Calcium: 8.7 mg/dL — ABNORMAL LOW (ref 8.9–10.3)
Calcium: 8.7 mg/dL — ABNORMAL LOW (ref 8.9–10.3)
Calcium: 8.9 mg/dL (ref 8.9–10.3)
Chloride: 103 mmol/L (ref 98–111)
Chloride: 102 mmol/L (ref 98–111)
Chloride: 101 mmol/L (ref 98–111)
Chloride: 101 mmol/L (ref 98–111)
Creatinine, Ser: 1.06 mg/dL (ref 0.61–1.24)
Creatinine, Ser: 1.08 mg/dL (ref 0.61–1.24)
Creatinine, Ser: 1.02 mg/dL (ref 0.61–1.24)
Creatinine, Ser: 1.1 mg/dL (ref 0.61–1.24)
GFR calc Af Amer: >60 mL/min (ref >60)
GFR calc Af Amer: >60 mL/min (ref >60)
GFR calc Af Amer: >60 mL/min (ref >60)
GFR calc Af Amer: >60 mL/min (ref >60)
GFR calc non Af Amer: >60 mL/min (ref >60)
GFR calc non Af Amer: >60 mL/min (ref >60)
GFR calc non Af Amer: >60 mL/min (ref >60)
GFR calc non Af Amer: >60 mL/min (ref >60)
Glucose, Bld: 153 mg/dL — ABNORMAL HIGH (ref 70–99)
Glucose, Bld: 200 mg/dL — ABNORMAL HIGH (ref 70–99)
Glucose, Bld: 162 mg/dL — ABNORMAL HIGH (ref 70–99)
Glucose, Bld: 279 mg/dL — ABNORMAL HIGH (ref 70–99)
Potassium: 3.4 mmol/L — ABNORMAL LOW (ref 3.5–5.1)
Potassium: 3.2 mmol/L — ABNORMAL LOW (ref 3.5–5.1)
Potassium: 3.5 mmol/L (ref 3.5–5.1)
Potassium: 3.6 mmol/L (ref 3.5–5.1)
Sodium: 136 mmol/L (ref 135–145)
Sodium: 135 mmol/L (ref 135–145)
Sodium: 136 mmol/L (ref 135–145)
Sodium: 135 mmol/L (ref 135–145)

## 2018-04-21 LAB — CBC
HCT: 40 % (ref 39.0–52.0)
Hemoglobin: 13.4 g/dL (ref 13.0–17.0)
MCH: 29.5 pg (ref 26.0–34.0)
MCHC: 33.5 g/dL (ref 30.0–36.0)
MCV: 88.1 fL (ref 80.0–100.0)
PLATELETS: 205 10*3/uL (ref 150–400)
RBC: 4.54 MIL/uL (ref 4.22–5.81)
RDW: 13.7 % (ref 11.5–15.5)
WBC: 6 10*3/uL (ref 4.0–10.5)
nRBC: 0 % (ref 0.0–0.2)

## 2018-04-21 LAB — GLUCOSE, CAPILLARY
GLUCOSE-CAPILLARY: 161 mg/dL — AB (ref 70–99)
GLUCOSE-CAPILLARY: 169 mg/dL — AB (ref 70–99)
GLUCOSE-CAPILLARY: 143 mg/dL — AB (ref 70–99)
Glucose-Capillary: 135 mg/dL — ABNORMAL HIGH (ref 70–99)
Glucose-Capillary: 186 mg/dL — ABNORMAL HIGH (ref 70–99)
Glucose-Capillary: 175 mg/dL — ABNORMAL HIGH (ref 70–99)
Glucose-Capillary: 194 mg/dL — ABNORMAL HIGH (ref 70–99)
Glucose-Capillary: 141 mg/dL — ABNORMAL HIGH (ref 70–99)
Glucose-Capillary: 156 mg/dL — ABNORMAL HIGH (ref 70–99)
Glucose-Capillary: 141 mg/dL — ABNORMAL HIGH (ref 70–99)
Glucose-Capillary: 257 mg/dL — ABNORMAL HIGH (ref 70–99)
Glucose-Capillary: 266 mg/dL — ABNORMAL HIGH (ref 70–99)
Glucose-Capillary: 326 mg/dL — ABNORMAL HIGH (ref 70–99)
Glucose-Capillary: 225 mg/dL — ABNORMAL HIGH (ref 70–99)

## 2018-04-21 LAB — HEMOGLOBIN A1C
Hgb A1c MFr Bld: 13.6 % — ABNORMAL HIGH (ref 4.8–5.6)
Mean Plasma Glucose: 343.62 mg/dL

## 2018-04-21 MED ORDER — INSULIN ASPART 100 UNIT/ML ~~LOC~~ SOLN: 8.0000 [IU] | Freq: Three times a day (TID) | SUBCUTANEOUS | 0 refills | Status: DC

## 2018-04-21 MED ORDER — INSULIN GLARGINE 100 UNIT/ML ~~LOC~~ SOLN
10.0000 [IU] | SUBCUTANEOUS | Status: DC
  Administered 2018-04-21: 10 [IU] via SUBCUTANEOUS
  Filled 2018-04-21: qty 0.1

## 2018-04-21 MED ORDER — POTASSIUM CHLORIDE CRYS ER 20 MEQ PO TBCR
40.0000 meq | EXTENDED_RELEASE_TABLET | Freq: Once | ORAL | Status: AC
  Administered 2018-04-21: 40 meq via ORAL
  Filled 2018-04-21: qty 2

## 2018-04-21 MED ORDER — INSULIN GLARGINE 100 UNIT/ML ~~LOC~~ SOLN: 24.0000 [IU] | SUBCUTANEOUS | 0 refills | Status: DC

## 2018-04-21 MED ORDER — INSULIN ASPART 100 UNIT/ML ~~LOC~~ SOLN
3.0000 [IU] | Freq: Three times a day (TID) | SUBCUTANEOUS | Status: DC
  Administered 2018-04-21: 3 [IU] via SUBCUTANEOUS

## 2018-04-21 MED ORDER — INSULIN GLARGINE 100 UNIT/ML ~~LOC~~ SOLN
14.0000 [IU] | Freq: Once | SUBCUTANEOUS | Status: AC
  Administered 2018-04-21: 14 [IU] via SUBCUTANEOUS
  Filled 2018-04-21: qty 0.14

## 2018-04-21 MED ORDER — LACTATED RINGERS IV BOLUS: 1000.0000 mL | Freq: Once | INTRAVENOUS | Status: DC

## 2018-04-21 MED ORDER — INSULIN ASPART 100 UNIT/ML ~~LOC~~ SOLN
0.0000 [IU] | Freq: Three times a day (TID) | SUBCUTANEOUS | Status: DC
  Administered 2018-04-21: 5 [IU] via SUBCUTANEOUS

## 2018-04-21 NOTE — Progress Notes (Signed)
   Subjective: [name] was doing okay today, no acute events overnight. He reports that his abdominal pain has improved, denies any nausea or vomiting. He reports that he can eat, but states that he doesn't feel like doing much. He reported that he felt better compared to when he came in.  We discussed that we have a clinic that he can go too if he would like to and he reported that he was interested.   Objective:  Vital signs in last 24 hours: Vitals:   04/20/18 1900 04/20/18 1958 04/20/18 2355 04/21/18 0424  BP: (!) 143/91 137/85 122/90 (!) 131/93  Pulse: 86  73 66  Resp: 18  17 13   Temp:  98.3 F (36.8 C) 98 F (36.7 C) 98 F (36.7 C)  TempSrc:  Oral Oral Oral  SpO2: 99% 100% 100% 100%  Weight:  79.2 kg    Height:  6' (1.829 m)      General: Tired appearing, NAD, laying in bed Cardiac: RRR, no m/r/g Pulmonary: CTABL, no wheezing or rhonchi Abdomen: Soft, non-tender, non-distended, +BS Extremity:No LE edema Psychiatry: Flat affect, depressed mood   Assessment/Plan:  Active Problems:   DKA (diabetic ketoacidoses) (HCC)  This is a 33 year old male with history of uncontrolled diabetes who resented with a 4-day history of abdominal pain, nausea, vomiting, decreased appetite, polyuria, polydipsia and decreased energy after not taking any insulin for 4 days.  Diabetic ketoacidosis: -Patient was on insulin drip and fluids overnight, started on D5 1/2 NS. BMP showed normal AG x3. He reported improvement in his abdominal pain, nausea and vomiting. He remains afebrile with no leukocytosis. A1c was 13. This morning K was 3.2, replaced with K-dur. He has uncontrolled diabetes and will need close follow up and help establishing a PCP. He was interested in coming to the IMTS clinic.  -Transition to SubQ insulin today.  -Continue IV insulin x2 hours after lantus is given -Lantus 10 units daily -Novolog 3 units TID WC -Frequent CBGs -BMP daily -If his CBGs remain normal and he is  tolerating a diet and feeling well in the afternoon may discharge home today -Will provide prescription for Lantus and Novolog on discharge -Will help arrange follow up with IMTS  HIV: -Follows with Dr. Luciana Axe, last saw him on 2/18. CD4 at that time was 710. Viral load is still pending. He reports that he has been taking his Genvoya as prescribed, denies missing any doses. Hepatic labs were unremarkable on admission. Appears to be well controlled.  -Continue Genvoya -F/u viral load o/p  History of right tibia and fibular fractures s/p ORIF: -Denies any acute issues today. -Continue tylenol PRN  FEN: D5 1/2 NS now cc/hr, stop after lantus is given and tolerating diet, replete lytes prn, heart healthy diet VTE ppx: Lovenox  Code Status:  DNR- readdressed today, he reports that it doesn't matter now   Dispo: Anticipated discharge in approximately 0-1 day(s).   Claudean Severance, MD 04/21/2018, 6:25 AM Pager: [phone]

## 2018-04-21 NOTE — Discharge Summary (Signed)
Name: [name] MRN: [id number] DOB: 1985/08/12 33 y.o. PCP: Lavinia Sharps, NP  Date of Admission: 04/20/2018  1:44 PM Date of Discharge: 04/21/2018 04/21/2018  3:54 PM Attending Physician: Doneen Poisson D  Discharge Diagnosis: 1. Diabetic ketoacidosis, type 1 DM 2. H/o HIV  Discharge Medications: Allergies as of 04/21/2018      Reactions   Shellfish Allergy Anaphylaxis, Hives      Medication List    STOP taking these medications   aspirin 325 MG EC tablet   gabapentin 300 MG capsule Commonly known as:  NEURONTIN   HYDROcodone-acetaminophen 5-325 MG tablet Commonly known as:  NORCO/VICODIN   insulin aspart protamine- aspart (70-30) 100 UNIT/ML injection Commonly known as:  NOVOLOG MIX 70/30   polyethylene glycol packet Commonly known as:  MIRALAX / GLYCOLAX     TAKE these medications   acetaminophen 500 MG tablet Commonly known as:  TYLENOL Take 1 tablet (500 mg total) by mouth every 6 (six) hours as needed.   citalopram 20 MG tablet Commonly known as:  CELEXA TAKE 1 TABLET BY MOUTH DAILY. CALL OFFICE TO SCHEDULE VISIT FOR FUTURE REFILLS [phone] What changed:  See the new instructions.   feeding supplement Liqd Take 1 Container by mouth 2 (two) times daily between meals.   GENVOYA 150-150-200-10 MG Tabs tablet Generic drug:  elvitegravir-cobicistat-emtricitabine-tenofovir TAKE 1 TABLET BY MOUTH DAILY   glucose blood test strip Commonly known as:  TRUE METRIX BLOOD GLUCOSE TEST Use as instructed   ibuprofen 200 MG tablet Commonly known as:  ADVIL,MOTRIN Take 600 mg by mouth every 6 (six) hours as needed for moderate pain.   insulin aspart 100 UNIT/ML injection Commonly known as:  novoLOG Inject 8 Units into the skin 3 (three) times daily with meals.   insulin glargine 100 UNIT/ML injection Commonly known as:  LANTUS Inject 0.24 mLs (24 Units total) into the skin daily.   INSULIN SYRINGE 1CC/31GX5/16" 31G X 5/16" 1 ML Misc Use 3 times  per day to inject insulin subcutaneous.  Dx code: 250.01   multivitamin with minerals tablet Take 1 tablet by mouth daily.   TIGER BALM EXTRA STRENGTH 11-10 % Oint Apply 1 application topically as needed (leg pain).   TRUE METRIX AIR GLUCOSE METER Devi 1 each by Does not apply route 4 (four) times daily -  with meals and at bedtime.   TRUEPLUS LANCETS 28G Misc USE AS DIRECTED       Disposition and follow-up:   [name] was discharged from [facility] in [place] condition.  At the hospital follow up visit please address:  1.  Diabetic ketoacidosis, type 1 DM: Left AMA prior to [place] figuring out a good insulin regimen, started on lantus 24 units daily and novolog 3 units TID WC, please adjust as needed. He also needs assistance figuring out how much to take since his social situation is tenuous.  HIV: Takes genvoya, follows with ID already  2.  Labs / imaging needed at time of follow-up: CBC, BMP  3.  Pending labs/ test needing follow-up: None  Follow-up Appointments: Follow-up Information    Placey, Chales Abrahams, NP. Schedule an appointment as soon as possible for a visit in 1 week(s).   Contact information: [address] [place] [address] [phone]           Hospital Course by problem list: 1. Diabetic ketoacidosis, type 1 DM: This is a 33 year old male with history of uncontrolled type 1 diabetes who presented  with a 4-day history of abdominal pain, nausea, vomiting, anorexia, polyuria.,  Polydipsia, and decreased energy after missing his insulin for 4 days.  Found to have hyperglycemia to 32, glucosuria and ketonuria, and an anion gap metabolic acidosis.  Exam he was tachycardic and hypertensive, fatigued appearing diaphoretic and some mild abdominal tenderness.  He was treated with an insulin drip and fluids overnight, anion gap closed and he had symptomatic improvement.  A1c was elevated to 13.  Likely due to running out of insulin, no signs  of infection or ischemia.  Attempted to finalize a good outpatient insulin regimen for him however patient did not want to stay for this to be done, patient left AMA.  Was given samples of Tresiba and NovoLog, we discussed how to use those medications and to check his sugar times daily.[name]  He has follow-up with a PCP next week.  2. H/o well controlled HIV: Follows with Dr. Luciana Axe of ID, saw on 2/18.  CD4 at that time was 710, viral load is undetectable.  He takes his Januvia as prescribed and does not miss any doses.  This was continued on discharge.  Discharge Vitals:   BP (!) 138/91 (BP Location: Left Arm)   Pulse 66   Temp 98.2 F (36.8 C) (Oral)   Resp (!) 0   Ht 6' (1.829 m)   Wt 79.2 kg   SpO2 100%   BMI 23.68 kg/m   Pertinent Labs, Studies, and Procedures:  CBC Latest Ref Rng & Units 04/21/2018 04/20/2018 04/20/2018  WBC 4.0 - 10.5 K/uL 6.0 - 5.1  Hemoglobin 13.0 - 17.0 g/dL 50.5 39.7 67.3  Hematocrit 39.0 - 52.0 % 40.0 46.0 45.6  Platelets 150 - 400 K/uL 205 - 238   BMP Latest Ref Rng & Units 04/21/2018 04/21/2018 04/21/2018  Glucose 70 - 99 mg/dL 419(F) 790(W) 409(B)  BUN 6 - 20 mg/dL 14 17 35(H)  Creatinine 0.61 - 1.24 mg/dL 2.99 2.42 6.83  BUN/Creat Ratio 6 - 22 (calc) - - -  Sodium 135 - 145 mmol/L 135 136 135  Potassium 3.5 - 5.1 mmol/L 3.6 3.5 3.2(L)  Chloride 98 - 111 mmol/L 101 101 102  CO2 22 - 32 mmol/L 27 28 23   Calcium 8.9 - 10.3 mg/dL 8.9 4.1(D) 6.2(I)   W9N: 13.6  Discharge Instructions: Discharge Instructions    Call MD for:  difficulty breathing, headache or visual disturbances   Complete by:  As directed    Call MD for:  extreme fatigue   Complete by:  As directed    Call MD for:  hives   Complete by:  As directed    Call MD for:  persistant dizziness or light-headedness   Complete by:  As directed    Call MD for:  persistant nausea and vomiting   Complete by:  As directed    Call MD for:  redness, tenderness, or signs of infection (pain, swelling,  redness, odor or green/yellow discharge around incision site)   Complete by:  As directed    Call MD for:  severe uncontrolled pain   Complete by:  As directed    Call MD for:  temperature >100.4   Complete by:  As directed    Diet - low sodium heart healthy   Complete by:  As directed    Discharge instructions   Complete by:  As directed    Mariane Baumgarten,   It has been a pleasure working with you and we are glad you're feeling  better. You were hospitalized for diabetes ketoacidosis.  You were having difficulty getting your insulin and your blood pressure was very elevated when he first came in.  We treated you with IV insulin and fluids and you improved.  We started you on some insulin while you are here, a long-acting and short-acting insulin.  Your A1c was 13. It would have been safest for you to stay until we figured out which insulin dose you should be taking.   For your diabetes,  START taking Lantus 24 units daily  Start taking NovoLog 3 units 3 time daily with meals Continue to check your blood sugars up to 4 times a day Please follow-up with the clinic within the week to figure out a good insulin regimen for you  If your symptoms worsen or you develop new symptoms, please seek medical help whether it is your primary care provider or emergency department.  If you have any questions about this hospitalization please call [phone].   Increase activity slowly   Complete by:  As directed       Signed: Claudean Severance, MD 04/23/2018, 1:49 PM   Pager: [phone]

## 2018-04-21 NOTE — Progress Notes (Signed)
Sent pt's home meds to pharmacy, pt is aware and he singed for it.

## 2018-04-21 NOTE — Progress Notes (Signed)
Insulin drip running with the rate adjustments every hr accordingly depending on the CBG readings, MD frequently updated, pt stable, denied any discomfort at the moment, monitoring continues

## 2018-04-21 NOTE — Progress Notes (Addendum)
AVS reviewed with patient. Patient's medication locked up in pharmacy returned to patient. Cab voucher given to patient. Patient escorted by nurse to cab in front of hospital for discharge.

## 2018-04-21 NOTE — Progress Notes (Addendum)
Patient uninsured. Verified w him at bedside that PCP is Heide Spark at [facility]. Left Hippa compliant VM requesting callback. Patient provided with [facility] letter, with override for Lantus Vial

## 2018-04-21 NOTE — Discharge Instructions (Signed)
[name],   It has been a pleasure working with you and we are glad you're feeling better. You were hospitalized for diabetes ketoacidosis.  You were having difficulty getting your insulin and your blood pressure was very elevated when he first came in.  We treated you with IV insulin and fluids and you improved.  We started you on some insulin while you are here, a long-acting and short-acting insulin.  Your A1c was 13. It would have been safest for you to stay until we figured out which insulin dose you should be taking.   For your diabetes,  START taking Tresiba 24 units daily  Start taking NovoLog 3 units 3 time daily with meals Continue to check your blood sugars up to 4 times a day Please follow-up with the clinic within the week to figure out a good insulin regimen for you  If your symptoms worsen or you develop new symptoms, please seek medical help whether it is your primary care provider or emergency department.  If you have any questions about this hospitalization please call [phone].

## 2018-04-21 NOTE — Progress Notes (Signed)
CSW acknowledges consult for insurance. CSW provided resources for the patient. CSW provided financial counseling and DSS contact information.   CSW signing off.   Drucilla Schmidt, MSW, LCSW-A Clinical Social Worker [employer]

## 2018-04-22 LAB — HIV-1 RNA ULTRAQUANT REFLEX TO GENTYP+
HIV 1 RNA QUANT: NOT DETECTED {copies}/mL
HIV-1 RNA QUANT, LOG: NOT DETECTED {Log_copies}/mL

## 2018-04-22 LAB — RPR: RPR Ser Ql: NONREACTIVE

## 2018-04-30 ENCOUNTER — Ambulatory Visit: Payer: No Typology Code available for payment source | Admitting: Family Medicine

## 2018-05-23 ENCOUNTER — Other Ambulatory Visit: Payer: Self-pay | Admitting: *Deleted

## 2018-05-23 DIAGNOSIS — B20 Human immunodeficiency virus [HIV] disease: Secondary | ICD-10-CM

## 2018-05-23 MED ORDER — ELVITEG-COBIC-EMTRICIT-TENOFAF 150-150-200-10 MG PO TABS: 1.0000 | ORAL_TABLET | Freq: Every day | ORAL | 3 refills | Status: DC

## 2018-06-19 ENCOUNTER — Telehealth: Payer: Self-pay | Admitting: Pharmacist

## 2018-06-19 NOTE — Telephone Encounter (Signed)
Patient is approved for [employer]'s Advancing Access program from 12/22/17 to 12/22/2018.  His pharmacy processing numbers are:  ID: [id number] BIN: [id number] PCN: [id number] Group: [id number]

## 2018-08-02 ENCOUNTER — Other Ambulatory Visit: Payer: Self-pay

## 2018-08-02 DIAGNOSIS — Z113 Encounter for screening for infections with a predominantly sexual mode of transmission: Secondary | ICD-10-CM

## 2018-08-02 DIAGNOSIS — B20 Human immunodeficiency virus [HIV] disease: Secondary | ICD-10-CM

## 2018-08-06 ENCOUNTER — Other Ambulatory Visit: Payer: Self-pay

## 2018-08-19 ENCOUNTER — Other Ambulatory Visit: Payer: Self-pay | Admitting: Internal Medicine

## 2018-08-20 ENCOUNTER — Encounter: Payer: Self-pay | Admitting: Internal Medicine

## 2018-11-13 ENCOUNTER — Other Ambulatory Visit: Payer: Self-pay | Admitting: Internal Medicine

## 2018-11-13 DIAGNOSIS — B20 Human immunodeficiency virus [HIV] disease: Secondary | ICD-10-CM

## 2018-12-18 ENCOUNTER — Other Ambulatory Visit: Payer: Self-pay | Admitting: Internal Medicine

## 2018-12-24 ENCOUNTER — Telehealth: Payer: Self-pay | Admitting: Pharmacy Technician

## 2018-12-24 NOTE — Telephone Encounter (Signed)
[facility] Patient Advocate Encounter  Completed and sent [employer] Advancing Access application for Genvoya for this patient who is uninsured.    He was denied.   I called Advancing Access and they will not assist the patient further.  The reasons are:  1.  He had assistance for a year with [employer] through 12/22/2018 already.  They reached out to him during the year with a letter asking him to let them know if he was HMAP approved at any time so they could give the assistance to someone who needs it.  He never did.    2.  They said he should have gotten reenrolled with HMAP in time.    There is no manufacturer assistance for him.   The clinic had an unopened bottle of Genvoya that was offered to the patient.  Venida Jarvis. [place] Patient [facility] Phone: [phone] Fax:  [phone]

## 2018-12-28 ENCOUNTER — Other Ambulatory Visit: Payer: Self-pay | Admitting: Internal Medicine

## 2018-12-28 DIAGNOSIS — F329 Major depressive disorder, single episode, unspecified: Secondary | ICD-10-CM

## 2018-12-28 DIAGNOSIS — F32A Depression, unspecified: Secondary | ICD-10-CM

## 2018-12-31 ENCOUNTER — Other Ambulatory Visit: Payer: Self-pay

## 2018-12-31 ENCOUNTER — Ambulatory Visit: Payer: Self-pay

## 2018-12-31 ENCOUNTER — Encounter: Payer: Self-pay | Admitting: Internal Medicine

## 2018-12-31 DIAGNOSIS — Z113 Encounter for screening for infections with a predominantly sexual mode of transmission: Secondary | ICD-10-CM

## 2018-12-31 DIAGNOSIS — B20 Human immunodeficiency virus [HIV] disease: Secondary | ICD-10-CM

## 2019-01-01 ENCOUNTER — Other Ambulatory Visit: Payer: Self-pay

## 2019-01-01 DIAGNOSIS — Z113 Encounter for screening for infections with a predominantly sexual mode of transmission: Secondary | ICD-10-CM

## 2019-01-01 DIAGNOSIS — B20 Human immunodeficiency virus [HIV] disease: Secondary | ICD-10-CM

## 2019-01-02 ENCOUNTER — Telehealth: Payer: Self-pay

## 2019-01-02 LAB — T-HELPER CELL (CD4) - (RCID CLINIC ONLY)
CD4 % Helper T Cell: 36 % (ref 33–65)
CD4 T Cell Abs: 561 /uL (ref 400–1790)

## 2019-01-02 NOTE — Telephone Encounter (Signed)
-----   Message from Thayer Headings, MD sent at 01/02/2019  1:15 PM EST ----- New positive syphilis if we could get him in for Bicillin 2.4 million units x 1.  thanks

## 2019-01-02 NOTE — Telephone Encounter (Signed)
Attempted to call patient to schedule appointment regarding positive rpr. Unable to reach patient at this time. Left voicemail requesting he call office back. [place]

## 2019-01-05 LAB — CBC WITH DIFFERENTIAL/PLATELET
Absolute Monocytes: 529 cells/uL (ref 200–950)
Basophils Absolute: 32 cells/uL (ref 0–200)
Basophils Relative: 0.6 %
Eosinophils Absolute: 92 cells/uL (ref 15–500)
Eosinophils Relative: 1.7 %
HCT: 45.3 % (ref 38.5–50.0)
Hemoglobin: 15.3 g/dL (ref 13.2–17.1)
Lymphs Abs: 1723 cells/uL (ref 850–3900)
MCH: 31.7 pg (ref 27.0–33.0)
MCHC: 33.8 g/dL (ref 32.0–36.0)
MCV: 93.8 fL (ref 80.0–100.0)
MPV: 10.1 fL (ref 7.5–12.5)
Monocytes Relative: 9.8 %
Neutro Abs: 3024 cells/uL (ref 1500–7800)
Neutrophils Relative %: 56 %
Platelets: 279 10*3/uL (ref 140–400)
RBC: 4.83 10*6/uL (ref 4.20–5.80)
RDW: 12.5 % (ref 11.0–15.0)
Total Lymphocyte: 31.9 %
WBC: 5.4 10*3/uL (ref 3.8–10.8)

## 2019-01-05 LAB — COMPREHENSIVE METABOLIC PANEL
AG Ratio: 1.4 (calc) (ref 1.0–2.5)
ALT: 11 U/L (ref 9–46)
AST: 12 U/L (ref 10–40)
Albumin: 4 g/dL (ref 3.6–5.1)
Alkaline phosphatase (APISO): 69 U/L (ref 36–130)
BUN: 21 mg/dL (ref 7–25)
CO2: 28 mmol/L (ref 20–32)
Calcium: 9.5 mg/dL (ref 8.6–10.3)
Chloride: 102 mmol/L (ref 98–110)
Creat: 1.15 mg/dL (ref 0.60–1.35)
Globulin: 2.9 g/dL (calc) (ref 1.9–3.7)
Glucose, Bld: 331 mg/dL — ABNORMAL HIGH (ref 65–99)
Potassium: 4.9 mmol/L (ref 3.5–5.3)
Sodium: 136 mmol/L (ref 135–146)
Total Bilirubin: 0.4 mg/dL (ref 0.2–1.2)
Total Protein: 6.9 g/dL (ref 6.1–8.1)

## 2019-01-05 LAB — FLUORESCENT TREPONEMAL AB(FTA)-IGG-BLD: Fluorescent Treponemal ABS: REACTIVE — AB

## 2019-01-05 LAB — HIV-1 RNA QUANT-NO REFLEX-BLD
HIV 1 RNA Quant: <20 copies/mL
HIV-1 RNA Quant, Log: <1.3 Log copies/mL

## 2019-01-05 LAB — RPR: RPR Ser Ql: REACTIVE — AB

## 2019-01-05 LAB — RPR TITER: RPR Titer: 1:32 {titer} — ABNORMAL HIGH

## 2019-01-15 ENCOUNTER — Ambulatory Visit (INDEPENDENT_AMBULATORY_CARE_PROVIDER_SITE_OTHER): Payer: Self-pay | Admitting: Internal Medicine

## 2019-01-15 ENCOUNTER — Encounter: Payer: Self-pay | Admitting: Internal Medicine

## 2019-01-15 ENCOUNTER — Other Ambulatory Visit: Payer: Self-pay

## 2019-01-15 VITALS — BP 136/89 | HR 104 | Temp 98.0°F | Ht 72.0 in | Wt 186.0 lb

## 2019-01-15 DIAGNOSIS — F439 Reaction to severe stress, unspecified: Secondary | ICD-10-CM

## 2019-01-15 DIAGNOSIS — A539 Syphilis, unspecified: Secondary | ICD-10-CM

## 2019-01-15 DIAGNOSIS — Z23 Encounter for immunization: Secondary | ICD-10-CM

## 2019-01-15 DIAGNOSIS — E1021 Type 1 diabetes mellitus with diabetic nephropathy: Secondary | ICD-10-CM

## 2019-01-15 DIAGNOSIS — B20 Human immunodeficiency virus [HIV] disease: Secondary | ICD-10-CM

## 2019-01-15 MED ORDER — PENICILLIN G BENZATHINE 1200000 UNIT/2ML IM SUSP
1.2000 10*6.[IU] | Freq: Once | INTRAMUSCULAR | Status: AC
  Administered 2019-01-15: 1.2 10*6.[IU] via INTRAMUSCULAR

## 2019-01-15 NOTE — Assessment & Plan Note (Addendum)
New problem. Bicillin given today x 1.   Now treated

## 2019-01-15 NOTE — Assessment & Plan Note (Signed)
He will get scheduled with our counselor

## 2019-01-15 NOTE — Assessment & Plan Note (Signed)
He is again managing on his own with 70/30 Will help get him plugged in with PCP and help with getting him insurance.

## 2019-01-15 NOTE — Progress Notes (Signed)
   Subjective:    Patient ID: [name], male    DOB: August 11, 1985, 33 y.o.   MRN: [id number]  HPI Here for follow up of HIV He has continued on Genvoya after his HMAP expired in the spring and doing well again.  CD4 of 561, viral load < 20.  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  Has not been going to his PCP since she has not been practicing due to [place].  Has been buying 70/30 insulin.     Review of Systems  Constitutional: Negative for fatigue.  Gastrointestinal: Negative for diarrhea and nausea.  Neurological: Negative for dizziness.       Objective:   Physical Exam Constitutional:      Appearance: Normal appearance.  Eyes:     General: No scleral icterus. Cardiovascular:     Rate and Rhythm: Normal rate and regular rhythm.     Heart sounds: No murmur.  Pulmonary:     Effort: Pulmonary effort is normal.     Breath sounds: Normal breath sounds.  Musculoskeletal:     Right lower leg: No edema.     Left lower leg: No edema.  Skin:    Findings: No rash.  Neurological:     Mental Status: He is alert.  Psychiatric:        Mood and Affect: Mood normal.    SH: + tobacco       Assessment & Plan:

## 2019-01-15 NOTE — Assessment & Plan Note (Signed)
Labs look good with no issues.  Will continue with his Genvoya and will continue.  rtc 6 months.  Will get him information about the exchange for insurance

## 2019-01-31 ENCOUNTER — Ambulatory Visit: Payer: Self-pay

## 2019-02-05 ENCOUNTER — Other Ambulatory Visit: Payer: Self-pay | Admitting: Internal Medicine

## 2019-02-05 DIAGNOSIS — F32A Depression, unspecified: Secondary | ICD-10-CM

## 2019-02-05 DIAGNOSIS — F329 Major depressive disorder, single episode, unspecified: Secondary | ICD-10-CM

## 2019-03-08 ENCOUNTER — Other Ambulatory Visit: Payer: Self-pay

## 2019-03-08 ENCOUNTER — Encounter: Payer: Self-pay | Admitting: Nurse Practitioner

## 2019-03-08 ENCOUNTER — Ambulatory Visit: Payer: Self-pay | Attending: Nurse Practitioner | Admitting: Nurse Practitioner

## 2019-03-08 DIAGNOSIS — E1042 Type 1 diabetes mellitus with diabetic polyneuropathy: Secondary | ICD-10-CM

## 2019-03-08 DIAGNOSIS — Z130 Encounter for screening for diseases of the blood and blood-forming organs and certain disorders involving the immune mechanism: Secondary | ICD-10-CM

## 2019-03-08 MED ORDER — BD PEN NEEDLE MINI U/F 31G X 5 MM MISC: 6 refills | Status: DC

## 2019-03-08 MED ORDER — TRUE METRIX BLOOD GLUCOSE TEST VI STRP: ORAL_STRIP | 12 refills | Status: DC

## 2019-03-08 MED ORDER — TRUEPLUS LANCETS 28G MISC: 3 refills | Status: DC

## 2019-03-08 MED ORDER — TRUE METRIX METER W/DEVICE KIT: PACK | 0 refills | Status: DC

## 2019-03-08 MED ORDER — BASAGLAR KWIKPEN 100 UNIT/ML ~~LOC~~ SOPN: 40.0000 [IU] | PEN_INJECTOR | Freq: Every day | SUBCUTANEOUS | 6 refills | Status: DC

## 2019-03-08 MED ORDER — INSULIN LISPRO (1 UNIT DIAL) 100 UNIT/ML (KWIKPEN): 10.0000 [IU] | PEN_INJECTOR | Freq: Three times a day (TID) | SUBCUTANEOUS | 11 refills | Status: DC

## 2019-03-08 MED ORDER — PRAVASTATIN SODIUM 20 MG PO TABS: 20.0000 mg | ORAL_TABLET | Freq: Every day | ORAL | 3 refills | Status: DC

## 2019-03-08 MED FILL — PRAVASTATIN SODIUM 20 MG TA: 20 | 30 days supply | Qty: 30 | Fill #0

## 2019-03-08 MED FILL — HUMALOG 100 UNITS/ML KWIKPE: 100 | 30 days supply | Qty: 9 | Fill #0

## 2019-03-08 MED FILL — !LANTUS SOLOSTAR 100UNITS/M: 100 | 30 days supply | Qty: 12 | Fill #0

## 2019-03-08 MED FILL — TRUEPLUS 5-BEVEL PEN NEEDLE: 31G X 5 MM | 25 days supply | Qty: 100 | Fill #0

## 2019-03-08 MED FILL — TRUE METRIX TEST STRIP: 25 days supply | Qty: 100 | Fill #0

## 2019-03-08 MED FILL — TRUEplus LANCETS 28G MISC: 25 days supply | Qty: 100 | Fill #0

## 2019-03-08 MED FILL — !TRUE METRIX BLOOD GLUCOSE: 365 days supply | Qty: 1 | Fill #0

## 2019-03-08 NOTE — Progress Notes (Signed)
Virtual Visit via Telephone Note Due to national recommendations of social distancing due to [place] 19, telehealth visit is felt to be most appropriate for this patient at this time.  I discussed the limitations, risks, security and privacy concerns of performing an evaluation and management service by telephone and the availability of in person appointments. I also discussed with the patient that there may be a patient responsible charge related to this service. The patient expressed understanding and agreed to proceed.    I connected with Andrena Mews on 03/08/19  at  10:30 AM EST  EDT by telephone and verified that I am speaking with the correct person using two identifiers.   Consent I discussed the limitations, risks, security and privacy concerns of performing an evaluation and management service by telephone and the availability of in person appointments. I also discussed with the patient that there may be a patient responsible charge related to this service. The patient expressed understanding and agreed to proceed.   Location of Patient: Private Residence   Location of Provider: [place] [facility] [employer] Office    Persons participating in Telemedicine visit: Geryl Rankins FNP-BC [place]    History of Present Illness: Telemedicine visit for: Establish Care  has a past medical history of Anxiety, Asthma, Depression, Headache, HIV (human immunodeficiency virus infection) ([place]), Migraine, Refusal of blood transfusions as patient is Jehovah's Witness, Seizure ([place]), and Type I diabetes mellitus ([place]).   Seeing Dr. Linus Salmons for HIV. Taking Celexa for Depression     Type 1 DM Diagnosed with Type 1 DM as a child. Last hospital admission 03-2018 he left AMA prior to them establishing an appropriate  insulin regimen. He is not diet adherent. Eating lots of carbs/starches. :"I love peanut butter". States his living arrangements are not always suitable for  healthy eating (he has been homeless at times).  He does have vast working knowledge of foods that he should be limiting or avoiding in regards to good diabetic control.  Currently administering Novolin 70/30 which he is purchasing out-of-pocket.  Also endorses neuropathy in the right lower extremity from leg to foot and worse in the right toes.  He feels his neuropathy is related to his tibial IM nailing that was performed secondary to closed displaced comminuted fracture of shaft of right tibia secondary to fall on 11/01/2017. States right knee also gives out at times and he has fallen. He followed up with surgeon per his report and was instructed that the neuropathy was not likely related to the surgery.  Lab Results  Component Value Date   HGBA1C 13.6 (H) 04/21/2018  Will start him on Novolog and Basaglar today. He will need to follow up in 4 weeks for meter check.  He was also urged strongly to apply for the financial assistance as he really does need to be under the care of an endocrinologist.    Past Medical History:  Diagnosis Date  . Anxiety   . Asthma    no prior hospitalizations, intubations  . Depression   . Headache    "only w/stress" (11/01/2017)  . HIV (human immunodeficiency virus infection) ([place])   . Migraine    "a few/month" (11/01/2017)  . Refusal of blood transfusions as patient is Jehovah's Witness   . Seizure ([place])    "only w/my low blood sugars"  (11/01/2017)  . Type I diabetes mellitus ([place])     Past Surgical History:  Procedure Laterality Date  . FRACTURE SURGERY    .  IM NAILING TIBIA Right 11/01/2017   INTRAMEDULLARY (IM) NAIL TIBIALRightGeneral  . ORIF ANKLE FRACTURE Right 11/01/2017  . ORIF ANKLE FRACTURE Right 11/01/2017   Procedure: OPEN REDUCTION INTERNAL FIXATION (ORIF) ANKLE FRACTURE;  Surgeon: Shona Needles, MD;  Location: [place];  Service: Orthopedics;  Laterality: Right;  . TIBIA IM NAIL INSERTION Right 11/01/2017   Procedure: INTRAMEDULLARY (IM) NAIL  TIBIAL;  Surgeon: Shona Needles, MD;  Location: [place];  Service: Orthopedics;  Laterality: Right;    Family History  Problem Relation Age of Onset  . Thyroid disease Mother   . Diabetes Mother   . Breast cancer Maternal Grandmother   . Diabetes Maternal Grandmother     Social History   Socioeconomic History  . Marital status: Single    Spouse name: Not on file  . Number of children: Not on file  . Years of education: Not on file  . Highest education level: Not on file  Occupational History  . Not on file  Tobacco Use  . Smoking status: Current Every Day Smoker    Types: Cigars    Start date: 09/29/2015  . Smokeless tobacco: Never Used  . Tobacco comment: 11/01/2017  "2 cigarettes in ~ 6 month "  Substance and Sexual Activity  . Alcohol use: Yes    Alcohol/week: 7.0 standard drinks    Types: 7 Cans of beer per week  . Drug use: Yes    Frequency: 2.0 times per week    Types: Marijuana    Comment: 11/01/2017  "weekly"  . Sexual activity: Yes    Partners: Male    Comment: pt. declined condoms  Other Topics Concern  . Not on file  Social History Narrative  . Not on file   Social Determinants of Health   Financial Resource Strain:   . Difficulty of Paying Living Expenses: Not on file  Food Insecurity:   . Worried About Charity fundraiser in the Last Year: Not on file  . Ran Out of Food in the Last Year: Not on file  Transportation Needs:   . Lack of Transportation (Medical): Not on file  . Lack of Transportation (Non-Medical): Not on file  Physical Activity:   . Days of Exercise per Week: Not on file  . Minutes of Exercise per Session: Not on file  Stress:   . Feeling of Stress : Not on file  Social Connections:   . Frequency of Communication with Friends and Family: Not on file  . Frequency of Social Gatherings with Friends and Family: Not on file  . Attends Religious Services: Not on file  . Active Member of Clubs or Organizations: Not on file  . Attends Theatre manager Meetings: Not on file  . Marital Status: Not on file     Observations/Objective: Awake, alert and oriented x 3   Review of Systems  Constitutional: Negative for fever, malaise/fatigue and weight loss.  HENT: Negative.  Negative for nosebleeds.   Eyes: Negative.  Negative for blurred vision, double vision and photophobia.  Respiratory: Negative.  Negative for cough and shortness of breath.   Cardiovascular: Negative.  Negative for chest pain, palpitations and leg swelling.  Gastrointestinal: Negative.  Negative for heartburn, nausea and vomiting.  Musculoskeletal: Negative.  Negative for myalgias.  Neurological: Positive for sensory change. Negative for dizziness, focal weakness, seizures and headaches.  Psychiatric/Behavioral: Negative.  Negative for suicidal ideas.    Assessment and Plan: Yamil was seen today for establish care.  Diagnoses and all orders for this visit:  Type 1 diabetes mellitus with diabetic polyneuropathy ([place]) -     Blood Glucose Monitoring Suppl (TRUE METRIX METER) w/Device KIT; Use as instructed. Check blood glucose level by fingerstick three times per day. -     glucose blood (TRUE METRIX BLOOD GLUCOSE TEST) test strip; Use as instructed -     TRUEplus Lancets 28G MISC; Use as instructed. Check blood glucose level by fingerstick three per day. -     A1c -     CMP14+EGFR -     Lipid Panel -     urine micro -     insulin lispro (HUMALOG KWIKPEN) 100 UNIT/ML KwikPen; Inject 0.1 mLs (10 Units total) into the skin 3 (three) times daily with meals. ICD 10 E10.42 -     Insulin Pen Needle (B-D UF III MINI PEN NEEDLES) 31G X 5 MM MISC; Use as instructed. Inject into the skin four times daily. ICD 10 E10.42 -     Insulin Glargine (BASAGLAR KWIKPEN) 100 UNIT/ML SOPN; Inject 0.4 mLs (40 Units total) into the skin daily. ICD 10 E10.42 Diabetes is poorly controlled. Advised patient to keep a fasting blood sugar log fast, 2 hours post lunch and bedtime which  will be reviewed at the next office visit.   I had a long discussion today with Mr. Noreene Larsson regarding his poorly controlled diabetes which is increasing his risk of early death, kidney disease, possible amputation, heart attack or stroke.  He is also still smoking which also increases his risk of peripheral vascular disease, cancer, heart attack and stroke.   Diabetes is poorly controlled. Advised patient to keep a fasting blood sugar log fast, 2 hours post lunch and bedtime which will be reviewed at the next office visit.  Continue blood sugar control as discussed in office today, low carbohydrate diet, and regular physical exercise as tolerated, 150 minutes per week (30 min each day, 5 days per week, or 50 min 3 days per week). Keep blood sugar logs with fasting goal of 90-130 mg/dl, post prandial (after you eat) less than 180.  For Hypoglycemia: BS <60 and Hyperglycemia BS >400; contact the clinic ASAP. Annual eye exams and foot exams are recommended.  Screening for deficiency anemia -     CBC     Follow Up Instructions Return in about 4 weeks (around 04/05/2019) for Meter check.     I discussed the assessment and treatment plan with the patient. The patient was provided an opportunity to ask questions and all were answered. The patient agreed with the plan and demonstrated an understanding of the instructions.   The patient was advised to call back or seek an in-person evaluation if the symptoms worsen or if the condition fails to improve as anticipated.  I provided 20 minutes of non-face-to-face time during this encounter including median intraservice time, reviewing previous notes, labs, imaging, medications and explaining diagnosis and management.  Gildardo Pounds, FNP-BC

## 2019-03-12 ENCOUNTER — Other Ambulatory Visit: Payer: No Typology Code available for payment source

## 2019-03-30 ENCOUNTER — Inpatient Hospital Stay (HOSPITAL_COMMUNITY)
Admission: EM | Admit: 2019-03-30 | Discharge: 2019-03-31 | DRG: 638 | Disposition: A | Discharge status: Home / Self Care | Payer: No Typology Code available for payment source | Attending: Family Medicine | Admitting: Family Medicine

## 2019-03-30 ENCOUNTER — Other Ambulatory Visit: Payer: Self-pay

## 2019-03-30 ENCOUNTER — Encounter (HOSPITAL_COMMUNITY): Payer: Self-pay

## 2019-03-30 DIAGNOSIS — Z833 Family history of diabetes mellitus: Secondary | ICD-10-CM

## 2019-03-30 DIAGNOSIS — Z20822 Contact with and (suspected) exposure to covid-19: Secondary | ICD-10-CM | POA: Diagnosis present

## 2019-03-30 DIAGNOSIS — F329 Major depressive disorder, single episode, unspecified: Secondary | ICD-10-CM | POA: Diagnosis present

## 2019-03-30 DIAGNOSIS — Z794 Long term (current) use of insulin: Secondary | ICD-10-CM

## 2019-03-30 DIAGNOSIS — E162 Hypoglycemia, unspecified: Secondary | ICD-10-CM

## 2019-03-30 DIAGNOSIS — Z21 Asymptomatic human immunodeficiency virus [HIV] infection status: Secondary | ICD-10-CM | POA: Diagnosis present

## 2019-03-30 DIAGNOSIS — E10649 Type 1 diabetes mellitus with hypoglycemia without coma: Principal | ICD-10-CM | POA: Diagnosis present

## 2019-03-30 DIAGNOSIS — T383X5A Adverse effect of insulin and oral hypoglycemic [antidiabetic] drugs, initial encounter: Secondary | ICD-10-CM | POA: Diagnosis present

## 2019-03-30 DIAGNOSIS — Z91013 Allergy to seafood: Secondary | ICD-10-CM

## 2019-03-30 DIAGNOSIS — R569 Unspecified convulsions: Secondary | ICD-10-CM | POA: Diagnosis present

## 2019-03-30 DIAGNOSIS — F419 Anxiety disorder, unspecified: Secondary | ICD-10-CM | POA: Diagnosis present

## 2019-03-30 DIAGNOSIS — F32A Depression, unspecified: Secondary | ICD-10-CM | POA: Diagnosis present

## 2019-03-30 DIAGNOSIS — J45909 Unspecified asthma, uncomplicated: Secondary | ICD-10-CM | POA: Diagnosis present

## 2019-03-30 DIAGNOSIS — B20 Human immunodeficiency virus [HIV] disease: Secondary | ICD-10-CM | POA: Diagnosis present

## 2019-03-30 DIAGNOSIS — F17290 Nicotine dependence, other tobacco product, uncomplicated: Secondary | ICD-10-CM | POA: Diagnosis present

## 2019-03-30 DIAGNOSIS — Z79899 Other long term (current) drug therapy: Secondary | ICD-10-CM

## 2019-03-30 DIAGNOSIS — E160 Drug-induced hypoglycemia without coma: Secondary | ICD-10-CM | POA: Diagnosis present

## 2019-03-30 DIAGNOSIS — Z8349 Family history of other endocrine, nutritional and metabolic diseases: Secondary | ICD-10-CM

## 2019-03-30 DIAGNOSIS — E109 Type 1 diabetes mellitus without complications: Secondary | ICD-10-CM | POA: Diagnosis present

## 2019-03-30 LAB — CBC WITH DIFFERENTIAL/PLATELET
Abs Immature Granulocytes: 0.03 10*3/uL (ref 0.00–0.07)
Basophils Absolute: 0 10*3/uL (ref 0.0–0.1)
Basophils Relative: 0 %
Eosinophils Absolute: 0 10*3/uL (ref 0.0–0.5)
Eosinophils Relative: 0 %
HCT: 50.4 % (ref 39.0–52.0)
Hemoglobin: 17.3 g/dL — ABNORMAL HIGH (ref 13.0–17.0)
Immature Granulocytes: 0 %
Lymphocytes Relative: 11 %
Lymphs Abs: 0.9 10*3/uL (ref 0.7–4.0)
MCH: 31.6 pg (ref 26.0–34.0)
MCHC: 34.3 g/dL (ref 30.0–36.0)
MCV: 92 fL (ref 80.0–100.0)
Monocytes Absolute: 0.4 10*3/uL (ref 0.1–1.0)
Monocytes Relative: 5 %
Neutro Abs: 7.5 10*3/uL (ref 1.7–7.7)
Neutrophils Relative %: 84 %
Platelets: 315 10*3/uL (ref 150–400)
RBC: 5.48 MIL/uL (ref 4.22–5.81)
RDW: 13.3 % (ref 11.5–15.5)
WBC: 8.9 10*3/uL (ref 4.0–10.5)
nRBC: 0 % (ref 0.0–0.2)

## 2019-03-30 LAB — COMPREHENSIVE METABOLIC PANEL
ALT: 18 U/L (ref 0–44)
AST: 29 U/L (ref 15–41)
Albumin: 4.1 g/dL (ref 3.5–5.0)
Alkaline Phosphatase: 56 U/L (ref 38–126)
Anion gap: 10 (ref 5–15)
BUN: 17 mg/dL (ref 6–20)
CO2: 25 mmol/L (ref 22–32)
Calcium: 10.1 mg/dL (ref 8.9–10.3)
Chloride: 104 mmol/L (ref 98–111)
Creatinine, Ser: 1.17 mg/dL (ref 0.61–1.24)
GFR calc Af Amer: >60 mL/min (ref >60)
GFR calc non Af Amer: >60 mL/min (ref >60)
Glucose, Bld: 356 mg/dL — ABNORMAL HIGH (ref 70–99)
Potassium: 4.6 mmol/L (ref 3.5–5.1)
Sodium: 139 mmol/L (ref 135–145)
Total Bilirubin: 1 mg/dL (ref 0.3–1.2)
Total Protein: 7.6 g/dL (ref 6.5–8.1)

## 2019-03-30 LAB — CBG MONITORING, ED
Glucose-Capillary: 100 mg/dL — ABNORMAL HIGH (ref 70–99)
Glucose-Capillary: 242 mg/dL — ABNORMAL HIGH (ref 70–99)
Glucose-Capillary: 33 mg/dL — CL (ref 70–99)
Glucose-Capillary: 81 mg/dL (ref 70–99)

## 2019-03-30 LAB — ETHANOL: Alcohol, Ethyl (B): <10 mg/dL (ref <10)

## 2019-03-30 MED ORDER — DEXTROSE 50 % IV SOLN
INTRAVENOUS | Status: AC
  Filled 2019-03-30: qty 50

## 2019-03-30 MED ORDER — DEXTROSE 50 % IV SOLN
1.0000 | Freq: Once | INTRAVENOUS | Status: AC
  Administered 2019-03-30: 50 mL via INTRAVENOUS

## 2019-03-30 NOTE — ED Notes (Signed)
Pt given [place] sandwich & ginger ale. Will recheck CBG @ 2300.

## 2019-03-30 NOTE — ED Notes (Signed)
Pt given 2nd OJ, will recheck CBG at 2200

## 2019-03-30 NOTE — ED Provider Notes (Signed)
[facility] EMERGENCY DEPARTMENT Provider Note   CSN: [id number] Arrival date & time: 03/30/19  2101     History No chief complaint on file.   [name] is a 34 y.o. male.  34yo M w/ PMH including IDDM, HIV, migraines, anxiety/depression, seizure who p/w hypoglycemia.  Patient was brought in from home for hypoglycemia that occurred tonight while he was sleeping.  Patient tells me that he took his usual medications today but did not eat anything for lunch or dinner.  He states that he went to sleep after dinner but then woke up feeling like his blood sugar was low.  He does not recall any other details of the event.  EMS reports his blood glucose was 41 at the scene, and was given 20g D10, repeat BG 115.  He was slow to respond and diaphoretic during transport.  Patient tells me that he has had no recent illness including no fevers, vomiting, diarrhea, cough/cold symptoms.  No recent medication changes.  Denies alcohol or drug use.  The history is provided by the patient and the EMS personnel.       Past Medical History:  Diagnosis Date  . Anxiety   . Asthma    no prior hospitalizations, intubations  . Depression   . Headache    "only w/stress" (11/01/2017)  . HIV (human immunodeficiency virus infection) ([place])   . Migraine    "a few/month" (11/01/2017)  . Refusal of blood transfusions as patient is Jehovah's Witness   . Seizure ([place])    "only w/my low blood sugars"  (11/01/2017)  . Type I diabetes mellitus [facility])     Patient Active Problem List   Diagnosis Date Noted  . Syphilis 01/15/2019  . DKA (diabetic ketoacidoses) ([place]) 04/20/2018  . Closed fracture of base of fifth metatarsal bone of right foot 11/13/2017  . Closed displaced comminuted fracture of shaft of right tibia 11/01/2017  . Closed fracture of medial malleolus of right ankle 10/31/2017  . Vaccine counseling 12/14/2016  . Screening examination for venereal disease 06/09/2015  . Encounter for  long-term (current) use of medications 06/09/2015  . Depression 03/04/2015  . Dizziness and giddiness 03/04/2015  . Tobacco dependence 11/12/2014  . Human immunodeficiency virus (HIV) disease ([place]) 11/11/2014  . Stress 12/19/2012  . Asthma 10/02/2010  . Diabetes mellitus type 1 ([place]) 03/01/1991    Past Surgical History:  Procedure Laterality Date  . FRACTURE SURGERY    . IM NAILING TIBIA Right 11/01/2017   INTRAMEDULLARY (IM) NAIL TIBIALRightGeneral  . ORIF ANKLE FRACTURE Right 11/01/2017  . ORIF ANKLE FRACTURE Right 11/01/2017   Procedure: OPEN REDUCTION INTERNAL FIXATION (ORIF) ANKLE FRACTURE;  Surgeon: Shona Needles, MD;  Location: [place];  Service: Orthopedics;  Laterality: Right;  . TIBIA IM NAIL INSERTION Right 11/01/2017   Procedure: INTRAMEDULLARY (IM) NAIL TIBIAL;  Surgeon: Shona Needles, MD;  Location: [place];  Service: Orthopedics;  Laterality: Right;       Family History  Problem Relation Age of Onset  . Thyroid disease Mother   . Diabetes Mother   . Breast cancer Maternal Grandmother   . Diabetes Maternal Grandmother     Social History   Tobacco Use  . Smoking status: Current Every Day Smoker    Types: Cigars    Start date: 09/29/2015  . Smokeless tobacco: Never Used  . Tobacco comment: 11/01/2017  "2 cigarettes in ~ 6 month "  Substance Use Topics  . Alcohol use: Yes  Alcohol/week: 7.0 standard drinks    Types: 7 Cans of beer per week  . Drug use: Yes    Frequency: 2.0 times per week    Types: Marijuana    Comment: 11/01/2017  "weekly"    Home Medications Prior to Admission medications   Medication Sig Start Date End Date Taking? Authorizing Provider  acetaminophen (TYLENOL) 500 MG tablet Take 1 tablet (500 mg total) by mouth every 6 (six) hours as needed. 02/10/15   Dorena Dew, FNP  Blood Glucose Monitoring Suppl (TRUE METRIX METER) w/Device KIT Use as instructed. Check blood glucose level by fingerstick three times per day. 03/08/19   Gildardo Pounds, NP  Camphor-Menthol (TIGER BALM EXTRA STRENGTH) 11-10 % OINT Apply 1 application topically as needed (leg pain).    [provider]  citalopram (CELEXA) 20 MG tablet TAKE 1 TABLET BY MOUTH DAILY 02/06/19   Comer, Okey Regal, MD  feeding supplement (BOOST HIGH PROTEIN) LIQD Take 1 Container by mouth 2 (two) times daily between meals.    [provider]  GENVOYA 150-150-200-10 MG TABS tablet TAKE 1 TABLET BY MOUTH DAILY 11/13/18   Comer, Okey Regal, MD  glucose blood (TRUE METRIX BLOOD GLUCOSE TEST) test strip Use as instructed 03/08/19   Gildardo Pounds, NP  ibuprofen (ADVIL,MOTRIN) 200 MG tablet Take 600 mg by mouth every 6 (six) hours as needed for moderate pain.    [provider]  Insulin Glargine (BASAGLAR KWIKPEN) 100 UNIT/ML SOPN Inject 0.4 mLs (40 Units total) into the skin daily. ICD 10 E10.42 03/08/19   Gildardo Pounds, NP  insulin lispro (HUMALOG KWIKPEN) 100 UNIT/ML KwikPen Inject 0.1 mLs (10 Units total) into the skin 3 (three) times daily with meals. ICD 10 E10.42 03/08/19   Gildardo Pounds, NP  Insulin Pen Needle (B-D UF III MINI PEN NEEDLES) 31G X 5 MM MISC Use as instructed. Inject into the skin four times daily. ICD 10 E10.42 03/08/19   Gildardo Pounds, NP  Multiple Vitamins-Minerals (MULTIVITAMIN WITH MINERALS) tablet Take 1 tablet by mouth daily.    [provider]  pravastatin (PRAVACHOL) 20 MG tablet Take 1 tablet (20 mg total) by mouth daily. 03/08/19   Gildardo Pounds, NP  TRUEplus Lancets 28G MISC Use as instructed. Check blood glucose level by fingerstick three per day. 03/08/19   Gildardo Pounds, NP    Allergies    Shellfish allergy  Review of Systems   Review of Systems All other systems reviewed and are negative except that which was mentioned in HPI  Physical Exam Updated Vital Signs BP (!) 136/93   Pulse 63   Temp 98.1 F (36.7 C) (Oral)   Resp 17   Ht 6' (1.829 m)   Wt 81.6 kg   SpO2 100%   BMI 24.41 kg/m   Physical  Exam Vitals and nursing note reviewed.  Constitutional:      General: He is not in acute distress.    Appearance: He is well-developed.     Comments: sleeping  HENT:     Head: Normocephalic and atraumatic.  Eyes:     Conjunctiva/sclera: Conjunctivae normal.  Cardiovascular:     Rate and Rhythm: Normal rate and regular rhythm.     Heart sounds: Normal heart sounds. No murmur.  Pulmonary:     Effort: Pulmonary effort is normal.     Breath sounds: Normal breath sounds.  Abdominal:     General: Bowel sounds are normal. There is  no distension.     Palpations: Abdomen is soft.     Tenderness: There is abdominal tenderness (mild epigastric).  Musculoskeletal:     Cervical back: Neck supple.  Skin:    General: Skin is warm and dry.  Neurological:     Mental Status: He is oriented to person, place, and time.     Comments: Fluent speech  Psychiatric:        Judgment: Judgment normal.     ED Results / Procedures / Treatments   Labs (all labs ordered are listed, but only abnormal results are displayed) Labs Reviewed  COMPREHENSIVE METABOLIC PANEL - Abnormal; Notable for the following components:      Result Value   Glucose, Bld 356 (*)    All other components within normal limits  CBC WITH DIFFERENTIAL/PLATELET - Abnormal; Notable for the following components:   Hemoglobin 17.3 (*)    All other components within normal limits  CBG MONITORING, ED - Abnormal; Notable for the following components:   Glucose-Capillary 33 (*)    All other components within normal limits  CBG MONITORING, ED - Abnormal; Notable for the following components:   Glucose-Capillary 100 (*)    All other components within normal limits  CBG MONITORING, ED - Abnormal; Notable for the following components:   Glucose-Capillary 242 (*)    All other components within normal limits  ETHANOL  RAPID URINE DRUG SCREEN, HOSP PERFORMED  CBG MONITORING, ED    EKG None  Radiology No results  found.  Procedures Procedures (including critical care time) CRITICAL CARE Performed by: Wenda Overland Cale [place]   Total critical care time: 30 minutes  Critical care time was exclusive of separately billable procedures and treating other patients.  Critical care was necessary to treat or prevent imminent or life-threatening deterioration.  Critical care was time spent personally by me on the following activities: development of treatment plan with patient and/or surrogate as well as nursing, discussions with consultants, evaluation of patient's response to treatment, examination of patient, obtaining history from patient or surrogate, ordering and performing treatments and interventions, ordering and review of laboratory studies, ordering and review of radiographic studies, pulse oximetry and re-evaluation of patient's condition.  Medications Ordered in ED Medications  dextrose 50 % solution (  Not Given 03/30/19 2133)  dextrose 50 % solution 50 mL (50 mLs Intravenous Given 03/30/19 2133)    ED Course  I have reviewed the triage vital signs and the nursing notes.  Pertinent labs that were available during my care of the patient were reviewed by me and considered in my medical decision making (see chart for details).    MDM Rules/Calculators/A&P                      Pt became diaphoretic after arrival and repeat BG 33. Given amp of D50. On repeat exam, he was asleep but arousable by voice and oriented. Drank juice. Repeat BG 100. Repeat BG 45 min later is 242, patient has eaten sandwich.   CMP and CBC unremarkable. We will observe the patient to ensure no repeat hypoglycemia episodes. I suspect his low BG is related to not eating lunch or dinner after having taken meds. I  Am signing out to oncoming provider pending BG recheck. Final Clinical Impression(s) / ED Diagnoses Final diagnoses:  None    Rx / DC Orders ED Discharge Orders    None       Tillmon Kisling, Wenda Overland,  MD 03/30/19 2338

## 2019-03-30 NOTE — ED Notes (Signed)
Pt's blood sugar 33, reported by Darrel NT. MD notified, 42mL D50 given, pt given orange juice

## 2019-03-30 NOTE — ED Triage Notes (Signed)
Pt BIB EMS for complaint of hypoglycemia. CBG was 41 at scene, pt was given 20g of D10, CBG 115 after that. Pt sluggish, slow to respond. EMS unsure if pt's lack of response/cooperation more behavioral or related to blood sugar. VSS en route

## 2019-03-31 ENCOUNTER — Encounter (HOSPITAL_COMMUNITY): Payer: Self-pay | Admitting: Family Medicine

## 2019-03-31 DIAGNOSIS — E162 Hypoglycemia, unspecified: Secondary | ICD-10-CM | POA: Diagnosis present

## 2019-03-31 DIAGNOSIS — E10649 Type 1 diabetes mellitus with hypoglycemia without coma: Principal | ICD-10-CM

## 2019-03-31 DIAGNOSIS — T383X5A Adverse effect of insulin and oral hypoglycemic [antidiabetic] drugs, initial encounter: Secondary | ICD-10-CM | POA: Diagnosis present

## 2019-03-31 DIAGNOSIS — F329 Major depressive disorder, single episode, unspecified: Secondary | ICD-10-CM

## 2019-03-31 DIAGNOSIS — E160 Drug-induced hypoglycemia without coma: Secondary | ICD-10-CM | POA: Diagnosis present

## 2019-03-31 DIAGNOSIS — B20 Human immunodeficiency virus [HIV] disease: Secondary | ICD-10-CM

## 2019-03-31 LAB — GLUCOSE, CAPILLARY
Glucose-Capillary: 120 mg/dL — ABNORMAL HIGH (ref 70–99)
Glucose-Capillary: 105 mg/dL — ABNORMAL HIGH (ref 70–99)
Glucose-Capillary: 130 mg/dL — ABNORMAL HIGH (ref 70–99)
Glucose-Capillary: 149 mg/dL — ABNORMAL HIGH (ref 70–99)
Glucose-Capillary: 149 mg/dL — ABNORMAL HIGH (ref 70–99)
Glucose-Capillary: 129 mg/dL — ABNORMAL HIGH (ref 70–99)

## 2019-03-31 LAB — CBG MONITORING, ED
Glucose-Capillary: 162 mg/dL — ABNORMAL HIGH (ref 70–99)
Glucose-Capillary: 41 mg/dL — CL (ref 70–99)
Glucose-Capillary: 60 mg/dL — ABNORMAL LOW (ref 70–99)

## 2019-03-31 LAB — BASIC METABOLIC PANEL
Anion gap: 10 (ref 5–15)
BUN: 14 mg/dL (ref 6–20)
CO2: 23 mmol/L (ref 22–32)
Calcium: 9.8 mg/dL (ref 8.9–10.3)
Chloride: 106 mmol/L (ref 98–111)
Creatinine, Ser: 0.91 mg/dL (ref 0.61–1.24)
GFR calc Af Amer: >60 mL/min (ref >60)
GFR calc non Af Amer: >60 mL/min (ref >60)
Glucose, Bld: 108 mg/dL — ABNORMAL HIGH (ref 70–99)
Potassium: 4.2 mmol/L (ref 3.5–5.1)
Sodium: 139 mmol/L (ref 135–145)

## 2019-03-31 LAB — SARS CORONAVIRUS 2 (TAT 6-24 HRS): SARS Coronavirus 2: NEGATIVE

## 2019-03-31 MED ORDER — ACETAMINOPHEN 500 MG PO TABS: 500.0000 mg | ORAL_TABLET | Freq: Four times a day (QID) | ORAL | Status: DC | PRN

## 2019-03-31 MED ORDER — ELVITEG-COBIC-EMTRICIT-TENOFAF 150-150-200-10 MG PO TABS
1.0000 | ORAL_TABLET | Freq: Every day | ORAL | Status: DC
  Administered 2019-03-31: 1 via ORAL
  Filled 2019-03-31 (×2): qty 1

## 2019-03-31 MED ORDER — DEXTROSE IN LACTATED RINGERS 5 % IV SOLN: INTRAVENOUS | Status: DC

## 2019-03-31 MED ORDER — BOOST PLUS PO LIQD
1.0000 | Freq: Two times a day (BID) | ORAL | Status: DC | PRN
  Filled 2019-03-31: qty 237

## 2019-03-31 MED ORDER — CITALOPRAM HYDROBROMIDE 20 MG PO TABS
20.0000 mg | ORAL_TABLET | Freq: Every day | ORAL | Status: DC
  Administered 2019-03-31: 10:00:00 20 mg via ORAL
  Filled 2019-03-31: qty 1

## 2019-03-31 MED ORDER — DEXTROSE 50 % IV SOLN: 1.0000 | Freq: Once | INTRAVENOUS | Status: DC

## 2019-03-31 MED ORDER — ENOXAPARIN SODIUM 40 MG/0.4ML ~~LOC~~ SOLN
40.0000 mg | Freq: Every day | SUBCUTANEOUS | Status: DC
  Filled 2019-03-31: qty 0.4

## 2019-03-31 MED ORDER — DEXTROSE 50 % IV SOLN
2.0000 | Freq: Once | INTRAVENOUS | Status: AC
  Administered 2019-03-31: 100 mL via INTRAVENOUS
  Filled 2019-03-31: qty 100

## 2019-03-31 NOTE — Plan of Care (Signed)
  Problem: Education: Goal: Knowledge of General Education information will improve Description: Including pain rating scale, medication(s)/side effects and non-pharmacologic comfort measures Outcome: Progressing   Problem: Clinical Measurements: Goal: Ability to maintain clinical measurements within normal limits will improve Outcome: Progressing   Problem: Coping: Goal: Level of anxiety will decrease Outcome: Progressing   

## 2019-03-31 NOTE — Plan of Care (Signed)
Pt discharging home. Discharge instructions explained to pt and pt verbalized understanding. Packed all personal belongings. No further questions or concerns voiced. Awaiting personal transportation.   Problem: Education: Goal: Knowledge of General Education information will improve Description: Including pain rating scale, medication(s)/side effects and non-pharmacologic comfort measures Outcome: Completed/Met   Problem: Health Behavior/Discharge Planning: Goal: Ability to manage health-related needs will improve Outcome: Completed/Met   Problem: Clinical Measurements: Goal: Ability to maintain clinical measurements within normal limits will improve Outcome: Completed/Met Goal: Will remain free from infection Outcome: Completed/Met Goal: Diagnostic test results will improve Outcome: Completed/Met Goal: Respiratory complications will improve Outcome: Completed/Met Goal: Cardiovascular complication will be avoided Outcome: Completed/Met   Problem: Activity: Goal: Risk for activity intolerance will decrease Outcome: Completed/Met   Problem: Nutrition: Goal: Adequate nutrition will be maintained Outcome: Completed/Met   Problem: Coping: Goal: Level of anxiety will decrease Outcome: Completed/Met   Problem: Elimination: Goal: Will not experience complications related to bowel motility Outcome: Completed/Met Goal: Will not experience complications related to urinary retention Outcome: Completed/Met   Problem: Pain Managment: Goal: General experience of comfort will improve Outcome: Completed/Met   Problem: Safety: Goal: Ability to remain free from injury will improve Outcome: Completed/Met   Problem: Skin Integrity: Goal: Risk for impaired skin integrity will decrease Outcome: Completed/Met

## 2019-03-31 NOTE — ED Notes (Signed)
This RN paged admitting physician regarding critical CBG

## 2019-03-31 NOTE — ED Provider Notes (Signed)
Care assumed from Dr. Clarene Duke, patient presented with hypoglycemia which initially responded to dextrose in the field but he arrived in the ED with recurrent hypoglycemia.  He was given dextrose, juice, food and glucose went up to 242, but came down to 81, and then down to 60.  He is clearly not safe to go home.  He started on a dextrose infusion.  Case was discussed with Dr. Cyndia Bent of [facility], who agrees to admit the patient.   Dione Booze, MD 03/31/19 [phone]

## 2019-03-31 NOTE — H&P (Signed)
History and Physical    [name] PFX:[id number] DOB: 25-Oct-1985 DOA: 03/30/2019  PCP: Cammie Sickle, NP Patient coming from: Home  I have personally briefly reviewed patient's old medical records in [place]  Chief Complaint: hypoglycemia   HPI: [name] is a 34 y.o. male with medical history significant of Hx of Type 1 diabetes, asthma, HIV, depression, syphilis s/p treatment who presented for hypoglycemia.   He reports that he got off of night shift this morning (1/30). Ate breakfast with grits and biscuit and took 50 units of 70/30 NovoLog. He previously saw PCP on 03/08/2019 and was started on Lispro 10 units TID and Glargine 40 units but says he never picked this up. He gives himself 50 units of 70/30 because he says this is the last regimen that someone gave him in the past.  He then went to bed and a friend later came to check on him. He was found diaphoretic under the covers and unresponsive.   EMS found CBC of 41 on scene and it improved to 115 after 20g of D10.  In the ER, CBC of 33 and was given 50cc of D50 and orange juice. Improved to 200s but again downward trend. He was started on continuous D5 LR.      Review of Systems:  Constitutional: No Weight Change, No Fever ENT/Mouth: No sore throat, No Rhinorrhea Eyes: No Eye Pain, No Vision Changes Cardiovascular: No Chest Pain, no SOB, Respiratory: No Cough Gastrointestinal: No Nausea, No Vomiting Genitourinary: noUrgency,  Musculoskeletal: No Arthralgias, No Myalgias Skin: No Skin Lesions, No Pruritus, Neuro: no Weakness, No Numbness Psych: No Anxiety/Panic, + Depression, + decrease appetite Heme/Lymph: No Bruising, No Bleeding  Past Medical History:  Diagnosis Date  . Anxiety   . Asthma    no prior hospitalizations, intubations  . Depression   . Headache    "only w/stress" (11/01/2017)  . HIV (human immunodeficiency virus infection) ([place])   . Migraine    "a few/month" (11/01/2017)  . Refusal of  blood transfusions as patient is Jehovah's Witness   . Seizure ([place])    "only w/my low blood sugars"  (11/01/2017)  . Type I diabetes mellitus ([place])     Past Surgical History:  Procedure Laterality Date  . FRACTURE SURGERY    . IM NAILING TIBIA Right 11/01/2017   INTRAMEDULLARY (IM) NAIL TIBIALRightGeneral  . ORIF ANKLE FRACTURE Right 11/01/2017  . ORIF ANKLE FRACTURE Right 11/01/2017   Procedure: OPEN REDUCTION INTERNAL FIXATION (ORIF) ANKLE FRACTURE;  Surgeon: Shona Needles, MD;  Location: [place];  Service: Orthopedics;  Laterality: Right;  . TIBIA IM NAIL INSERTION Right 11/01/2017   Procedure: INTRAMEDULLARY (IM) NAIL TIBIAL;  Surgeon: Shona Needles, MD;  Location: [place];  Service: Orthopedics;  Laterality: Right;     reports that he has been smoking cigars. He started smoking about 3 years ago. He has never used smokeless tobacco. He reports current alcohol use of about 7.0 standard drinks of alcohol per week. He reports current drug use. Frequency: 2.00 times per week. Drug: Marijuana.  Allergies  Allergen Reactions  . Shellfish Allergy Anaphylaxis and Hives    Family History  Problem Relation Age of Onset  . Thyroid disease Mother   . Diabetes Mother   . Breast cancer Maternal Grandmother   . Diabetes Maternal Grandmother      Prior to Admission medications   Medication Sig Start Date End Date Taking? Authorizing Provider  acetaminophen (TYLENOL) 500 MG tablet Take  1 tablet (500 mg total) by mouth every 6 (six) hours as needed. 02/10/15  Yes Dorena Dew, FNP  citalopram (CELEXA) 20 MG tablet TAKE 1 TABLET BY MOUTH DAILY 02/06/19  Yes Comer, Okey Regal, MD  feeding supplement (BOOST HIGH PROTEIN) LIQD Take 1 Container by mouth 2 (two) times daily as needed (supplement).    Yes [provider]  GENVOYA 150-150-200-10 MG TABS tablet TAKE 1 TABLET BY MOUTH DAILY 11/13/18  Yes Comer, Okey Regal, MD  ibuprofen (ADVIL,MOTRIN) 200 MG tablet Take 600 mg by mouth every 6  (six) hours as needed for moderate pain.   Yes [provider]  insulin aspart protamine- aspart (NOVOLOG MIX 70/30) (70-30) 100 UNIT/ML injection Inject 36-50 Units into the skin See admin instructions. 50 units in the morning, 36 units in the evening   Yes [provider]  Multiple Vitamins-Minerals (MULTIVITAMIN WITH MINERALS) tablet Take 1 tablet by mouth daily.   Yes [provider]  Blood Glucose Monitoring Suppl (TRUE METRIX METER) w/Device KIT Use as instructed. Check blood glucose level by fingerstick three times per day. 03/08/19   Gildardo Pounds, NP  glucose blood (TRUE METRIX BLOOD GLUCOSE TEST) test strip Use as instructed 03/08/19   Gildardo Pounds, NP  Insulin Glargine (BASAGLAR KWIKPEN) 100 UNIT/ML SOPN Inject 0.4 mLs (40 Units total) into the skin daily. ICD 10 E10.42 Patient not taking: Reported on 03/31/2019 03/08/19   Gildardo Pounds, NP  insulin lispro (HUMALOG KWIKPEN) 100 UNIT/ML KwikPen Inject 0.1 mLs (10 Units total) into the skin 3 (three) times daily with meals. ICD 10 E10.42 Patient not taking: Reported on 03/31/2019 03/08/19   Gildardo Pounds, NP  Insulin Pen Needle (B-D UF III MINI PEN NEEDLES) 31G X 5 MM MISC Use as instructed. Inject into the skin four times daily. ICD 10 E10.42 03/08/19   Gildardo Pounds, NP  pravastatin (PRAVACHOL) 20 MG tablet Take 1 tablet (20 mg total) by mouth daily. Patient not taking: Reported on 03/31/2019 03/08/19   Gildardo Pounds, NP  TRUEplus Lancets 28G MISC Use as instructed. Check blood glucose level by fingerstick three per day. 03/08/19   Gildardo Pounds, NP    Physical Exam: Vitals:   03/30/19 2315 03/30/19 2330 03/30/19 2345 03/31/19 0015  BP: (!) 136/93 (!) 134/91 (!) 137/93 126/78  Pulse: 63 63 62   Resp: _0 Temp:      TempSrc:      SpO2: 100% 100% 100%   Weight:      Height:        Constitutional: NAD, calm, comfortable, male laying flat in bed asleep. Vitals:   03/30/19 2315 03/30/19  2330 03/30/19 2345 03/31/19 0015  BP: (!) 136/93 (!) 134/91 (!) 137/93 126/78  Pulse: 63 63 62   Resp: _1 Temp:      TempSrc:      SpO2: 100% 100% 100%   Weight:      Height:       Eyes: PERRL, lids and conjunctivae normal ENMT: Mucous membranes are moist. Neck: normal, supple Respiratory: clear to auscultation bilaterally, no wheezing, no crackles. Normal respiratory effort.   Cardiovascular: Regular rate and rhythm, no murmurs / rubs / gallops. No extremity edema..  Abdomen: no tenderness, reducible umbilical hernia. Bowel sounds positive.  Musculoskeletal: no clubbing / cyanosis. No joint deformity upper and lower extremities. Good ROM, no contractures. Normal muscle tone.  Skin: no rashes, lesions, ulcers.  No induration Neurologic: CN 2-12 grossly intact. Sensation intact. Strength 5/5 in all 4.  Psychiatric: Normal judgment and insight. Alert and oriented x 3. Depressed mood-flat affect.     Labs on Admission: I have personally reviewed following labs and imaging studies  CBC: Recent Labs  Lab 03/30/19 2300  WBC 8.9  NEUTROABS 7.5  HGB 17.3*  HCT 50.4  MCV 92.0  PLT 676   Basic Metabolic Panel: Recent Labs  Lab 03/30/19 2300  NA 139  K 4.6  CL 104  CO2 25  GLUCOSE 356*  BUN 17  CREATININE 1.17  CALCIUM 10.1   GFR: Estimated Creatinine Clearance: 99.5 mL/min (by C-G formula based on SCr of 1.17 mg/dL). Liver Function Tests: Recent Labs  Lab 03/30/19 2300  AST 29  ALT 18  ALKPHOS 56  BILITOT 1.0  PROT 7.6  ALBUMIN 4.1   No results for input(s): LIPASE, AMYLASE in the last 168 hours. No results for input(s): AMMONIA in the last 168 hours. Coagulation Profile: No results for input(s): INR, PROTIME in the last 168 hours. Cardiac Enzymes: No results for input(s): CKTOTAL, CKMB, CKMBINDEX, TROPONINI in the last 168 hours. BNP (last 3 results) No results for input(s): PROBNP in the last 8760 hours. HbA1C: No results for input(s): HGBA1C in  the last 72 hours. CBG: Recent Labs  Lab 03/30/19 2128 03/30/19 2212 03/30/19 2259 03/30/19 2358 03/31/19 0045  GLUCAP 33* 100* 242* 81 60*   Lipid Profile: No results for input(s): CHOL, HDL, LDLCALC, TRIG, CHOLHDL, LDLDIRECT in the last 72 hours. Thyroid Function Tests: No results for input(s): TSH, T4TOTAL, FREET4, T3FREE, THYROIDAB in the last 72 hours. Anemia Panel: No results for input(s): VITAMINB12, FOLATE, FERRITIN, TIBC, IRON, RETICCTPCT in the last 72 hours. Urine analysis:    Component Value Date/Time   COLORURINE STRAW (A) 04/20/2018 1500   APPEARANCEUR CLEAR 04/20/2018 1500   LABSPEC 1.025 04/20/2018 1500   PHURINE 5.0 04/20/2018 1500   GLUCOSEU >=500 (A) 04/20/2018 1500   HGBUR NEGATIVE 04/20/2018 1500   BILIRUBINUR NEGATIVE 04/20/2018 1500   KETONESUR 20 (A) 04/20/2018 1500   PROTEINUR NEGATIVE 04/20/2018 1500   UROBILINOGEN 0.2 02/10/2015 1416   NITRITE NEGATIVE 04/20/2018 1500   LEUKOCYTESUR NEGATIVE 04/20/2018 1500    Radiological Exams on Admission: No results found.    Assessment/Plan  Hypoglycemia in the setting of insulin-dependent Type 1 diabetes Continue IV D5LR fluid  CBG q2 hrs  Consult social work for medication assistance  HIV Follows with ID outpatient  On Genvoya Last labs in 12/2018 showed undectable viral load and CD4 count of 561  Depression Continue Celexa Pt used to see therapy but has stopped since he does not think it is useful He does not have the will to live and says he has "tried in the past" to hurt himself No current suicidal plans Consult to spiritual care   DVT prophylaxis:.Lovenox Code Status: Full Family Communication: Plan discussed with patient at bedside  disposition Plan: Home with at least 2 midnight stays  Consults called:  Admission status: inpatient  Krisalyn Yankowski T Vanity Larsson DO [facility]   If 7PM-7AM, please contact night-coverage [email or web address] Password [email or web address]  03/31/2019, 1:19 AM

## 2019-03-31 NOTE — Discharge Summary (Signed)
Physician Discharge Summary  [name] KCM:[id number] DOB: Apr 07, 1985 DOA: 03/30/2019  PCP: Patient, No Pcp Per  Admit date: 03/30/2019 Discharge date: 03/31/2019  Admitted From: Home Disposition: Home  Recommendations for Outpatient Follow-up:  1. Follow up with PCP in 1-2 weeks 2. Please obtain BMP/CBC in one week 3. Please follow up on the following pending results:  Home Health: None Equipment/Devices: None  Discharge Condition: Stable CODE STATUS: Full code Diet recommendation: Diabetic/consistent carbohydrate  Subjective: Seen and examined.  No complaints but frustrated due to the fact that he is in the hospital due to hypoglycemia as he tells me that this is happened to him in the past as well.  HPI: [name] is a 34 y.o. male with medical history significant of Hx of Type 1 diabetes, asthma, HIV, depression, syphilis s/p treatment who presented for hypoglycemia.   He reports that he got off of night shift this morning (1/30). Ate breakfast with grits and biscuit and took 50 units of 70/30 NovoLog. He previously saw PCP on 03/08/2019 and was started on Lispro 10 units TID and Glargine 40 units but says he never picked this up. He gives himself 50 units of 70/30 because he says this is the last regimen that someone gave him in the past.  He then went to bed and a friend later came to check on him. He was found diaphoretic under the covers and unresponsive.   EMS found CBC of 41 on scene and it improved to 115 after 20g of D10.  In the ER, CBC of 33 and was given 50cc of D50 and orange juice. Improved to 200s but again downward trend. He was started on continuous D5 LR.   Brief/Interim Summary: Patient was admitted in the hospital due to hypoglycemia.  He was started on dextrose 5%.  His blood sugar has remained fairly stable.  His dextrose was stopped this morning around 9 AM.  After that, his blood sugar has remained stable around 140 and we have 3 readings.  He has no  complaint at this point in time.  After talking to him in detail, he tells me that he has not seen a physician in several years and that he goes to [facility] and he is able to get 70/30 insulin without any prescription and he uses about 50 units in the morning and sometimes changes it to his liking essentially managing his diabetes on its own.  I strongly encouraged him to find a PCP and get proper care.  He verbalized understanding.  Since I do not have full understanding of his dietary habits and he does not take any stable dosage so I am hesitant to change any diabetes regimen.  He is being discharged in stable condition.  Discharge Diagnoses:  Principal Problem:   Hypoglycemia Active Problems:   Diabetes mellitus type 1 ([place])   Human immunodeficiency virus (HIV) disease ([place])   Depression    Discharge Instructions  Discharge Instructions    Discharge patient   Complete by: As directed    Discharge disposition: 01-Home or Self Care   Discharge patient date: 03/31/2019     Allergies as of 03/31/2019      Reactions   Shellfish Allergy Anaphylaxis, Hives      Medication List    STOP taking these medications   Basaglar KwikPen 100 UNIT/ML Sopn     TAKE these medications   acetaminophen 500 MG tablet Commonly known as: TYLENOL Take 1 tablet (500 mg total) by mouth every  6 (six) hours as needed.   B-D UF III MINI PEN NEEDLES 31G X 5 MM Misc Generic drug: Insulin Pen Needle Use as instructed. Inject into the skin four times daily. ICD 10 E10.42   citalopram 20 MG tablet Commonly known as: CELEXA TAKE 1 TABLET BY MOUTH DAILY   feeding supplement Liqd Take 1 Container by mouth 2 (two) times daily as needed (supplement).   Genvoya 150-150-200-10 MG Tabs tablet Generic drug: elvitegravir-cobicistat-emtricitabine-tenofovir TAKE 1 TABLET BY MOUTH DAILY   ibuprofen 200 MG tablet Commonly known as: ADVIL Take 600 mg by mouth every 6 (six) hours as needed for moderate pain.    insulin aspart protamine- aspart (70-30) 100 UNIT/ML injection Commonly known as: NOVOLOG MIX 70/30 Inject 36-50 Units into the skin See admin instructions. 50 units in the morning, 36 units in the evening   insulin lispro 100 UNIT/ML KwikPen Commonly known as: HumaLOG KwikPen Inject 0.1 mLs (10 Units total) into the skin 3 (three) times daily with meals. ICD 10 E10.42   multivitamin with minerals tablet Take 1 tablet by mouth daily.   pravastatin 20 MG tablet Commonly known as: PRAVACHOL Take 1 tablet (20 mg total) by mouth daily.   True Metrix Blood Glucose Test test strip Generic drug: glucose blood Use as instructed   True Metrix Meter w/Device Kit Use as instructed. Check blood glucose level by fingerstick three times per day.   TRUEplus Lancets 28G Misc Use as instructed. Check blood glucose level by fingerstick three per day.      Follow-up Information    Your primary care provider.          Allergies  Allergen Reactions  . Shellfish Allergy Anaphylaxis and Hives    Consultations: None   Procedures/Studies:  No results found.   Discharge Exam: Vitals:   03/31/19 0215 03/31/19 0246  BP: 122/75 133/84  Pulse:  65  Resp: 15 14  Temp:  99 F (37.2 C)  SpO2:  99%   Vitals:   03/31/19 0130 03/31/19 0145 03/31/19 0215 03/31/19 0246  BP: (!) 154/114 (!) 143/102 122/75 133/84  Pulse:    65  Resp: _0 Temp:    99 F (37.2 C)  TempSrc:    Oral  SpO2:    99%  Weight:      Height:        General: Pt is alert, awake, not in acute distress Cardiovascular: RRR, S1/S2 +, no rubs, no gallops Respiratory: CTA bilaterally, no wheezing, no rhonchi Abdominal: Soft, NT, ND, bowel sounds + Extremities: no edema, no cyanosis    The results of significant diagnostics from this hospitalization (including imaging, microbiology, ancillary and laboratory) are listed below for reference.     Microbiology: Recent Results (from the past 240 hour(s))   SARS CORONAVIRUS 2 (TAT 6-24 HRS) Nasopharyngeal Nasopharyngeal Swab     Status: None   Collection Time: 03/31/19  2:17 AM   Specimen: Nasopharyngeal Swab  Result Value Ref Range Status   SARS Coronavirus 2 NEGATIVE NEGATIVE Final    Comment: (NOTE) SARS-CoV-2 target nucleic acids are NOT DETECTED. The SARS-CoV-2 RNA is generally detectable in upper and lower respiratory specimens during the acute phase of infection. Negative results do not preclude SARS-CoV-2 infection, do not rule out co-infections with other pathogens, and should not be used as the sole basis for treatment or other patient management decisions. Negative results must be combined with clinical observations, patient history, and epidemiological information. The expected result  is Negative. Fact Sheet for Patients: SugarRoll.be Fact Sheet for Healthcare Providers: https://www.woods-mathews.com/ This test is not yet approved or cleared by the [place] FDA and  has been authorized for detection and/or diagnosis of SARS-CoV-2 by FDA under an Emergency Use Authorization (EUA). This EUA will remain  in effect (meaning this test can be used) for the duration of the COVID-19 declaration under Section 56 4(b)(1) of the Act, 21 U.S.C. section 360bbb-3(b)(1), unless the authorization is terminated or revoked sooner. Performed at [facility], [place] [address] [place], [place] [address]      Labs: BNP (last 3 results) No results for input(s): BNP in the last 8760 hours. Basic Metabolic Panel: Recent Labs  Lab 03/30/19 2300 03/31/19 0623  NA 139 139  K 4.6 4.2  CL 104 106  CO2 25 23  GLUCOSE 356* 108*  BUN 17 14  CREATININE 1.17 0.91  CALCIUM 10.1 9.8   Liver Function Tests: Recent Labs  Lab 03/30/19 2300  AST 29  ALT 18  ALKPHOS 56  BILITOT 1.0  PROT 7.6  ALBUMIN 4.1   No results for input(s): LIPASE, AMYLASE in the last 168 hours. No results for  input(s): AMMONIA in the last 168 hours. CBC: Recent Labs  Lab 03/30/19 2300  WBC 8.9  NEUTROABS 7.5  HGB 17.3*  HCT 50.4  MCV 92.0  PLT 315   Cardiac Enzymes: No results for input(s): CKTOTAL, CKMB, CKMBINDEX, TROPONINI in the last 168 hours. BNP: Invalid input(s): POCBNP CBG: Recent Labs  Lab 03/31/19 0636 03/31/19 0813 03/31/19 1117 03/31/19 1220 03/31/19 1342  GLUCAP 105* 130* 149* 149* 129*   D-Dimer No results for input(s): DDIMER in the last 72 hours. Hgb A1c No results for input(s): HGBA1C in the last 72 hours. Lipid Profile No results for input(s): CHOL, HDL, LDLCALC, TRIG, CHOLHDL, LDLDIRECT in the last 72 hours. Thyroid function studies No results for input(s): TSH, T4TOTAL, T3FREE, THYROIDAB in the last 72 hours.  Invalid input(s): FREET3 Anemia work up No results for input(s): VITAMINB12, FOLATE, FERRITIN, TIBC, IRON, RETICCTPCT in the last 72 hours. Urinalysis    Component Value Date/Time   COLORURINE STRAW (A) 04/20/2018 1500   APPEARANCEUR CLEAR 04/20/2018 1500   LABSPEC 1.025 04/20/2018 1500   PHURINE 5.0 04/20/2018 1500   GLUCOSEU >=500 (A) 04/20/2018 1500   HGBUR NEGATIVE 04/20/2018 1500   BILIRUBINUR NEGATIVE 04/20/2018 1500   KETONESUR 20 (A) 04/20/2018 1500   PROTEINUR NEGATIVE 04/20/2018 1500   UROBILINOGEN 0.2 02/10/2015 1416   NITRITE NEGATIVE 04/20/2018 1500   LEUKOCYTESUR NEGATIVE 04/20/2018 1500   Sepsis Labs Invalid input(s): PROCALCITONIN,  WBC,  LACTICIDVEN Microbiology Recent Results (from the past 240 hour(s))  SARS CORONAVIRUS 2 (TAT 6-24 HRS) Nasopharyngeal Nasopharyngeal Swab     Status: None   Collection Time: 03/31/19  2:17 AM   Specimen: Nasopharyngeal Swab  Result Value Ref Range Status   SARS Coronavirus 2 NEGATIVE NEGATIVE Final    Comment: (NOTE) SARS-CoV-2 target nucleic acids are NOT DETECTED. The SARS-CoV-2 RNA is generally detectable in upper and lower respiratory specimens during the acute phase of  infection. Negative results do not preclude SARS-CoV-2 infection, do not rule out co-infections with other pathogens, and should not be used as the sole basis for treatment or other patient management decisions. Negative results must be combined with clinical observations, patient history, and epidemiological information. The expected result is Negative. Fact Sheet for Patients: SugarRoll.be Fact Sheet for Healthcare Providers: https://www.woods-mathews.com/ This test is not  yet approved or cleared by the [place] and  has been authorized for detection and/or diagnosis of SARS-CoV-2 by FDA under an Emergency Use Authorization (EUA). This EUA will remain  in effect (meaning this test can be used) for the duration of the COVID-19 declaration under Section 56 4(b)(1) of the Act, 21 U.S.C. section 360bbb-3(b)(1), unless the authorization is terminated or revoked sooner. Performed at [facility], [place] [address], [place], [place] [address]      Time coordinating discharge: Over 30 minutes  SIGNED:   Darliss Cheney, MD  [facility] 03/31/2019, 1:57 PM  If 7PM-7AM, please contact night-coverage [email or web address]

## 2019-03-31 NOTE — Discharge Instructions (Signed)
Hypoglycemia Hypoglycemia is when the sugar (glucose) level in your blood is too low. Signs of low blood sugar may include:  Feeling: ? Hungry. ? Worried or nervous (anxious). ? Sweaty and clammy. ? Confused. ? Dizzy. ? Sleepy. ? Sick to your stomach (nauseous).  Having: ? A fast heartbeat. ? A headache. ? A change in your vision. ? Tingling or no feeling (numbness) around your mouth, lips, or tongue. ? Jerky movements that you cannot control (seizure).  Having trouble with: ? Moving (coordination). ? Sleeping. ? Passing out (fainting). ? Getting upset easily (irritability). Low blood sugar can happen to people who have diabetes and people who do not have diabetes. Low blood sugar can happen quickly, and it can be an emergency. Treating low blood sugar Low blood sugar is often treated by eating or drinking something sugary right away, such as:  Fruit juice, 4-6 oz (120-150 mL).  Regular soda (not diet soda), 4-6 oz (120-150 mL).  Low-fat milk, 4 oz (120 mL).  Several pieces of hard candy.  Sugar or honey, 1 Tbsp (15 mL). Treating low blood sugar if you have diabetes If you can think clearly and swallow safely, follow the 15:15 rule:  Take 15 grams of a fast-acting carb (carbohydrate). Talk with your doctor about how much you should take.  Always keep a source of fast-acting carb with you, such as: ? Sugar tablets (glucose pills). Take 3-4 pills. ? 6-8 pieces of hard candy. ? 4-6 oz (120-150 mL) of fruit juice. ? 4-6 oz (120-150 mL) of regular (not diet) soda. ? 1 Tbsp (15 mL) honey or sugar.  Check your blood sugar 15 minutes after you take the carb.  If your blood sugar is still at or below 70 mg/dL (3.9 mmol/L), take 15 grams of a carb again.  If your blood sugar does not go above 70 mg/dL (3.9 mmol/L) after 3 tries, get help right away.  After your blood sugar goes back to normal, eat a meal or a snack within 1 hour.  Treating very low blood sugar If  your blood sugar is at or below 54 mg/dL (3 mmol/L), you have very low blood sugar (severe hypoglycemia). This may also cause:  Passing out.  Jerky movements you cannot control (seizure).  Losing consciousness (coma). This is an emergency. Do not wait to see if the symptoms will go away. Get medical help right away. Call your local emergency services (911 in the [place] Do not drive yourself to the hospital. If you have very low blood sugar and you cannot eat or drink, you may need a glucagon shot (injection). A family member or friend should learn how to check your blood sugar and how to give you a glucagon shot. Ask your doctor if you need to have a glucagon shot kit at home. Follow these instructions at home: General instructions  Take over-the-counter and prescription medicines only as told by your doctor.  Stay aware of your blood sugar as told by your doctor.  Limit alcohol intake to no more than 1 drink a day for nonpregnant women and 2 drinks a day for men. One drink equals 12 oz of beer (355 mL), 5 oz of wine (148 mL), or 1 oz of hard liquor (44 mL).  Keep all follow-up visits as told by your doctor. This is important. If you have diabetes:   Follow your diabetes care plan as told by your doctor. Make sure you: ? Know the signs of low blood  sugar. ? Take your medicines as told. ? Follow your exercise and meal plan. ? Eat on time. Do not skip meals. ? Check your blood sugar as often as told by your doctor. Always check it before and after exercise. ? Follow your sick day plan when you cannot eat or drink normally. Make this plan ahead of time with your doctor.  Share your diabetes care plan with: ? Your work or school. ? People you live with.  Check your pee (urine) for ketones: ? When you are sick. ? As told by your doctor.  Carry a card or wear jewelry that says you have diabetes. Contact a doctor if:  You have trouble keeping your blood sugar in your target  range.  You have low blood sugar often. Get help right away if:  You still have symptoms after you eat or drink something sugary.  Your blood sugar is at or below 54 mg/dL (3 mmol/L).  You have jerky movements that you cannot control.  You pass out. These symptoms may be an emergency. Do not wait to see if the symptoms will go away. Get medical help right away. Call your local emergency services (911 in the [place] Do not drive yourself to the hospital. Summary  Hypoglycemia happens when the level of sugar (glucose) in your blood is too low.  Low blood sugar can happen to people who have diabetes and people who do not have diabetes. Low blood sugar can happen quickly, and it can be an emergency.  Make sure you know the signs of low blood sugar and know how to treat it.  Always keep a source of sugar (fast-acting carb) with you to treat low blood sugar. This information is not intended to replace advice given to you by your health care provider. Make sure you discuss any questions you have with your health care provider. Document Revised: 06/07/2018 Document Reviewed: 03/20/2015 Elsevier Patient Education  2020 [place] not skip meals when taking your insulin, even if you only eat small amounts. Keep a close watch on your blood sugar for the next few days. Follow up with your primary doctor if you continue to have low blood sugar episodes.

## 2019-04-08 ENCOUNTER — Inpatient Hospital Stay (HOSPITAL_COMMUNITY)
Admission: EM | Admit: 2019-04-08 | Discharge: 2019-04-09 | DRG: 682 | Discharge status: Against Medical Advice | Payer: No Typology Code available for payment source | Attending: Internal Medicine | Admitting: Internal Medicine

## 2019-04-08 ENCOUNTER — Encounter (HOSPITAL_COMMUNITY): Payer: Self-pay | Admitting: Emergency Medicine

## 2019-04-08 ENCOUNTER — Other Ambulatory Visit: Payer: Self-pay

## 2019-04-08 DIAGNOSIS — G43909 Migraine, unspecified, not intractable, without status migrainosus: Secondary | ICD-10-CM | POA: Diagnosis present

## 2019-04-08 DIAGNOSIS — Z833 Family history of diabetes mellitus: Secondary | ICD-10-CM

## 2019-04-08 DIAGNOSIS — Z794 Long term (current) use of insulin: Secondary | ICD-10-CM

## 2019-04-08 DIAGNOSIS — R569 Unspecified convulsions: Secondary | ICD-10-CM | POA: Diagnosis present

## 2019-04-08 DIAGNOSIS — Z91013 Allergy to seafood: Secondary | ICD-10-CM

## 2019-04-08 DIAGNOSIS — F329 Major depressive disorder, single episode, unspecified: Secondary | ICD-10-CM | POA: Diagnosis present

## 2019-04-08 DIAGNOSIS — N179 Acute kidney failure, unspecified: Principal | ICD-10-CM | POA: Diagnosis present

## 2019-04-08 DIAGNOSIS — R630 Anorexia: Secondary | ICD-10-CM | POA: Diagnosis present

## 2019-04-08 DIAGNOSIS — F17290 Nicotine dependence, other tobacco product, uncomplicated: Secondary | ICD-10-CM | POA: Diagnosis present

## 2019-04-08 DIAGNOSIS — Z20822 Contact with and (suspected) exposure to covid-19: Secondary | ICD-10-CM | POA: Diagnosis present

## 2019-04-08 DIAGNOSIS — F419 Anxiety disorder, unspecified: Secondary | ICD-10-CM | POA: Diagnosis present

## 2019-04-08 DIAGNOSIS — J45909 Unspecified asthma, uncomplicated: Secondary | ICD-10-CM | POA: Diagnosis present

## 2019-04-08 DIAGNOSIS — Z79899 Other long term (current) drug therapy: Secondary | ICD-10-CM

## 2019-04-08 DIAGNOSIS — R112 Nausea with vomiting, unspecified: Secondary | ICD-10-CM

## 2019-04-08 DIAGNOSIS — E1110 Type 2 diabetes mellitus with ketoacidosis without coma: Secondary | ICD-10-CM | POA: Diagnosis present

## 2019-04-08 DIAGNOSIS — Z21 Asymptomatic human immunodeficiency virus [HIV] infection status: Secondary | ICD-10-CM | POA: Diagnosis present

## 2019-04-08 DIAGNOSIS — Z66 Do not resuscitate: Secondary | ICD-10-CM | POA: Diagnosis present

## 2019-04-08 DIAGNOSIS — E1010 Type 1 diabetes mellitus with ketoacidosis without coma: Secondary | ICD-10-CM | POA: Diagnosis present

## 2019-04-08 LAB — URINALYSIS, ROUTINE W REFLEX MICROSCOPIC
Bacteria, UA: NONE SEEN
Bilirubin Urine: NEGATIVE
Glucose, UA: >=500 mg/dL — AB
Ketones, ur: 20 mg/dL — AB
Leukocytes,Ua: NEGATIVE
Nitrite: NEGATIVE
Protein, ur: 100 mg/dL — AB
Specific Gravity, Urine: 1.024 (ref 1.005–1.030)
pH: 7 (ref 5.0–8.0)

## 2019-04-08 LAB — BASIC METABOLIC PANEL
Anion gap: 12 (ref 5–15)
Anion gap: 8 (ref 5–15)
BUN: 18 mg/dL (ref 6–20)
BUN: 13 mg/dL (ref 6–20)
CO2: 21 mmol/L — ABNORMAL LOW (ref 22–32)
CO2: 24 mmol/L (ref 22–32)
Calcium: 9.5 mg/dL (ref 8.9–10.3)
Calcium: 9.3 mg/dL (ref 8.9–10.3)
Chloride: 104 mmol/L (ref 98–111)
Chloride: 106 mmol/L (ref 98–111)
Creatinine, Ser: 1.08 mg/dL (ref 0.61–1.24)
Creatinine, Ser: 1.13 mg/dL (ref 0.61–1.24)
GFR calc Af Amer: >60 mL/min (ref >60)
GFR calc Af Amer: >60 mL/min (ref >60)
GFR calc non Af Amer: >60 mL/min (ref >60)
GFR calc non Af Amer: >60 mL/min (ref >60)
Glucose, Bld: 80 mg/dL (ref 70–99)
Glucose, Bld: 84 mg/dL (ref 70–99)
Potassium: 3.9 mmol/L (ref 3.5–5.1)
Potassium: 3.9 mmol/L (ref 3.5–5.1)
Sodium: 137 mmol/L (ref 135–145)
Sodium: 138 mmol/L (ref 135–145)

## 2019-04-08 LAB — COMPREHENSIVE METABOLIC PANEL
ALT: 22 U/L (ref 0–44)
AST: 19 U/L (ref 15–41)
Albumin: 4.3 g/dL (ref 3.5–5.0)
Alkaline Phosphatase: 73 U/L (ref 38–126)
Anion gap: 16 — ABNORMAL HIGH (ref 5–15)
BUN: 19 mg/dL (ref 6–20)
CO2: 20 mmol/L — ABNORMAL LOW (ref 22–32)
Calcium: 10.4 mg/dL — ABNORMAL HIGH (ref 8.9–10.3)
Chloride: 100 mmol/L (ref 98–111)
Creatinine, Ser: 1.34 mg/dL — ABNORMAL HIGH (ref 0.61–1.24)
GFR calc Af Amer: >60 mL/min (ref >60)
GFR calc non Af Amer: >60 mL/min (ref >60)
Glucose, Bld: 334 mg/dL — ABNORMAL HIGH (ref 70–99)
Potassium: 4.4 mmol/L (ref 3.5–5.1)
Sodium: 136 mmol/L (ref 135–145)
Total Bilirubin: 0.7 mg/dL (ref 0.3–1.2)
Total Protein: 7.8 g/dL (ref 6.5–8.1)

## 2019-04-08 LAB — CBC
HCT: 45.7 % (ref 39.0–52.0)
HCT: 46.6 % (ref 39.0–52.0)
Hemoglobin: 16 g/dL (ref 13.0–17.0)
Hemoglobin: 16 g/dL (ref 13.0–17.0)
MCH: 31.6 pg (ref 26.0–34.0)
MCH: 31.4 pg (ref 26.0–34.0)
MCHC: 35 g/dL (ref 30.0–36.0)
MCHC: 34.3 g/dL (ref 30.0–36.0)
MCV: 90.1 fL (ref 80.0–100.0)
MCV: 91.6 fL (ref 80.0–100.0)
Platelets: 317 10*3/uL (ref 150–400)
Platelets: 239 10*3/uL (ref 150–400)
RBC: 5.07 MIL/uL (ref 4.22–5.81)
RBC: 5.09 MIL/uL (ref 4.22–5.81)
RDW: 12.6 % (ref 11.5–15.5)
RDW: 12.8 % (ref 11.5–15.5)
WBC: 7.6 10*3/uL (ref 4.0–10.5)
WBC: 9.6 10*3/uL (ref 4.0–10.5)
nRBC: 0 % (ref 0.0–0.2)
nRBC: 0 % (ref 0.0–0.2)

## 2019-04-08 LAB — CBG MONITORING, ED
Glucose-Capillary: 325 mg/dL — ABNORMAL HIGH (ref 70–99)
Glucose-Capillary: 107 mg/dL — ABNORMAL HIGH (ref 70–99)
Glucose-Capillary: 117 mg/dL — ABNORMAL HIGH (ref 70–99)
Glucose-Capillary: 81 mg/dL (ref 70–99)
Glucose-Capillary: 61 mg/dL — ABNORMAL LOW (ref 70–99)
Glucose-Capillary: 138 mg/dL — ABNORMAL HIGH (ref 70–99)

## 2019-04-08 LAB — GLUCOSE, CAPILLARY
Glucose-Capillary: 86 mg/dL (ref 70–99)
Glucose-Capillary: 99 mg/dL (ref 70–99)
Glucose-Capillary: 73 mg/dL (ref 70–99)
Glucose-Capillary: 88 mg/dL (ref 70–99)
Glucose-Capillary: 113 mg/dL — ABNORMAL HIGH (ref 70–99)
Glucose-Capillary: 127 mg/dL — ABNORMAL HIGH (ref 70–99)
Glucose-Capillary: 138 mg/dL — ABNORMAL HIGH (ref 70–99)

## 2019-04-08 LAB — LIPASE, BLOOD: Lipase: 18 U/L (ref 11–51)

## 2019-04-08 LAB — RESPIRATORY PANEL BY RT PCR (FLU A&B, COVID)
Influenza A by PCR: NEGATIVE
Influenza B by PCR: NEGATIVE
SARS Coronavirus 2 by RT PCR: NEGATIVE

## 2019-04-08 LAB — BETA-HYDROXYBUTYRIC ACID: Beta-Hydroxybutyric Acid: 0.6 mmol/L — ABNORMAL HIGH (ref 0.05–0.27)

## 2019-04-08 MED ORDER — MORPHINE SULFATE (PF) 4 MG/ML IV SOLN
4.0000 mg | Freq: Once | INTRAVENOUS | Status: AC
  Administered 2019-04-08: 15:00:00 4 mg via INTRAVENOUS
  Filled 2019-04-08: qty 1

## 2019-04-08 MED ORDER — POTASSIUM CHLORIDE 10 MEQ/100ML IV SOLN
10.0000 meq | INTRAVENOUS | Status: AC
  Administered 2019-04-08 (×2): 10 meq via INTRAVENOUS
  Filled 2019-04-08 (×2): qty 100

## 2019-04-08 MED ORDER — INSULIN REGULAR(HUMAN) IN NACL 100-0.9 UT/100ML-% IV SOLN
INTRAVENOUS | Status: DC
  Administered 2019-04-08: 16:00:00 0.4 [IU]/h via INTRAVENOUS
  Filled 2019-04-08: qty 100

## 2019-04-08 MED ORDER — ONDANSETRON HCL 4 MG/2ML IJ SOLN
4.0000 mg | Freq: Once | INTRAMUSCULAR | Status: AC
  Administered 2019-04-08: 4 mg via INTRAVENOUS
  Filled 2019-04-08: qty 2

## 2019-04-08 MED ORDER — BOOST HIGH PROTEIN PO LIQD: 1.0000 | Freq: Two times a day (BID) | ORAL | Status: DC | PRN

## 2019-04-08 MED ORDER — SODIUM CHLORIDE 0.9 % IV BOLUS
20.0000 mL/kg | Freq: Once | INTRAVENOUS | Status: AC
  Administered 2019-04-08: 1632 mL via INTRAVENOUS

## 2019-04-08 MED ORDER — ELVITEG-COBIC-EMTRICIT-TENOFAF 150-150-200-10 MG PO TABS
1.0000 | ORAL_TABLET | Freq: Every day | ORAL | Status: DC
  Filled 2019-04-08 (×3): qty 1

## 2019-04-08 MED ORDER — ENOXAPARIN SODIUM 40 MG/0.4ML ~~LOC~~ SOLN
40.0000 mg | SUBCUTANEOUS | Status: DC
  Filled 2019-04-08: qty 0.4

## 2019-04-08 MED ORDER — INSULIN REGULAR(HUMAN) IN NACL 100-0.9 UT/100ML-% IV SOLN: INTRAVENOUS | Status: DC

## 2019-04-08 MED ORDER — SODIUM CHLORIDE 0.9 % IV SOLN: INTRAVENOUS | Status: DC

## 2019-04-08 MED ORDER — DEXTROSE 50 % IV SOLN
0.0000 mL | INTRAVENOUS | Status: DC | PRN
  Administered 2019-04-08: 17:00:00 35 mL via INTRAVENOUS
  Filled 2019-04-08: qty 50

## 2019-04-08 MED ORDER — ADULT MULTIVITAMIN W/MINERALS CH
1.0000 | ORAL_TABLET | Freq: Every day | ORAL | Status: DC
  Filled 2019-04-08: qty 1

## 2019-04-08 MED ORDER — DEXTROSE 50 % IV SOLN: 0.0000 mL | INTRAVENOUS | Status: DC | PRN

## 2019-04-08 MED ORDER — DEXTROSE-NACL 5-0.45 % IV SOLN: INTRAVENOUS | Status: DC

## 2019-04-08 MED ORDER — POTASSIUM CHLORIDE 10 MEQ/100ML IV SOLN: 10.0000 meq | INTRAVENOUS | Status: AC

## 2019-04-08 MED ORDER — INSULIN ASPART 100 UNIT/ML ~~LOC~~ SOLN
8.0000 [IU] | Freq: Once | SUBCUTANEOUS | Status: AC
  Administered 2019-04-08: 8 [IU] via INTRAVENOUS

## 2019-04-08 MED ORDER — CITALOPRAM HYDROBROMIDE 20 MG PO TABS
20.0000 mg | ORAL_TABLET | Freq: Every day | ORAL | Status: DC
  Filled 2019-04-08: qty 1

## 2019-04-08 MED ORDER — SODIUM CHLORIDE 0.9 % IV BOLUS
1000.0000 mL | Freq: Once | INTRAVENOUS | Status: AC
  Administered 2019-04-08: 1000 mL via INTRAVENOUS

## 2019-04-08 NOTE — ED Provider Notes (Signed)
[place] EMERGENCY DEPARTMENT Provider Note   CSN: [id number] Arrival date & time: 04/08/19  1215     History Chief Complaint  Patient presents with  . Abdominal Pain  . Emesis    [name] is a 34 y.o. male.  34 year old male with prior medical history as detailed below presents for evaluation of nausea and vomiting.  Patient reports onset of nausea and vomiting last night.  He reports he is not been able to keep anything down since.  He complains of associated vague abdominal cramping.  He denies fever.  He denies chest pain or shortness of breath.  He reports that his last use of insulin was earlier this morning.  The history is provided by the patient and medical records.  Emesis Severity:  Moderate Duration:  1 day Timing:  Constant Able to tolerate:  Liquids Progression:  Unchanged Chronicity:  New Relieved by:  Nothing Worsened by:  Nothing      Past Medical History:  Diagnosis Date  . Anxiety   . Asthma    no prior hospitalizations, intubations  . Depression   . Headache    "only w/stress" (11/01/2017)  . HIV (human immunodeficiency virus infection) ([place])   . Migraine    "a few/month" (11/01/2017)  . Refusal of blood transfusions as patient is Jehovah's Witness   . Seizure ([place])    "only w/my low blood sugars"  (11/01/2017)  . Type I diabetes mellitus [facility])     Patient Active Problem List   Diagnosis Date Noted  . Hypoglycemia 03/31/2019  . Syphilis 01/15/2019  . DKA (diabetic ketoacidoses) ([place]) 04/20/2018  . Closed fracture of base of fifth metatarsal bone of right foot 11/13/2017  . Closed displaced comminuted fracture of shaft of right tibia 11/01/2017  . Closed fracture of medial malleolus of right ankle 10/31/2017  . Vaccine counseling 12/14/2016  . Screening examination for venereal disease 06/09/2015  . Encounter for long-term (current) use of medications 06/09/2015  . Depression 03/04/2015  . Dizziness and giddiness  03/04/2015  . Tobacco dependence 11/12/2014  . Human immunodeficiency virus (HIV) disease ([place]) 11/11/2014  . Stress 12/19/2012  . Asthma 10/02/2010  . Diabetes mellitus type 1 ([place]) 03/01/1991    Past Surgical History:  Procedure Laterality Date  . FRACTURE SURGERY    . IM NAILING TIBIA Right 11/01/2017   INTRAMEDULLARY (IM) NAIL TIBIALRightGeneral  . ORIF ANKLE FRACTURE Right 11/01/2017  . ORIF ANKLE FRACTURE Right 11/01/2017   Procedure: OPEN REDUCTION INTERNAL FIXATION (ORIF) ANKLE FRACTURE;  Surgeon: Shona Needles, MD;  Location: [place];  Service: Orthopedics;  Laterality: Right;  . TIBIA IM NAIL INSERTION Right 11/01/2017   Procedure: INTRAMEDULLARY (IM) NAIL TIBIAL;  Surgeon: Shona Needles, MD;  Location: [place];  Service: Orthopedics;  Laterality: Right;       Family History  Problem Relation Age of Onset  . Thyroid disease Mother   . Diabetes Mother   . Breast cancer Maternal Grandmother   . Diabetes Maternal Grandmother     Social History   Tobacco Use  . Smoking status: Current Every Day Smoker    Types: Cigars    Start date: 09/29/2015  . Smokeless tobacco: Never Used  . Tobacco comment: 11/01/2017  "2 cigarettes in ~ 6 month "  Substance Use Topics  . Alcohol use: Yes    Alcohol/week: 7.0 standard drinks    Types: 7 Cans of beer per week  . Drug use: Yes  Frequency: 2.0 times per week    Types: Marijuana    Comment: 11/01/2017  "weekly"    Home Medications Prior to Admission medications   Medication Sig Start Date End Date Taking? Authorizing Provider  acetaminophen (TYLENOL) 500 MG tablet Take 1 tablet (500 mg total) by mouth every 6 (six) hours as needed. 02/10/15   Dorena Dew, FNP  Blood Glucose Monitoring Suppl (TRUE METRIX METER) w/Device KIT Use as instructed. Check blood glucose level by fingerstick three times per day. 03/08/19   Gildardo Pounds, NP  citalopram (CELEXA) 20 MG tablet TAKE 1 TABLET BY MOUTH DAILY 02/06/19   Comer, Okey Regal, MD    feeding supplement (BOOST HIGH PROTEIN) LIQD Take 1 Container by mouth 2 (two) times daily as needed (supplement).     [provider]  GENVOYA 150-150-200-10 MG TABS tablet TAKE 1 TABLET BY MOUTH DAILY 11/13/18   Comer, Okey Regal, MD  glucose blood (TRUE METRIX BLOOD GLUCOSE TEST) test strip Use as instructed 03/08/19   Gildardo Pounds, NP  ibuprofen (ADVIL,MOTRIN) 200 MG tablet Take 600 mg by mouth every 6 (six) hours as needed for moderate pain.    [provider]  insulin aspart protamine- aspart (NOVOLOG MIX 70/30) (70-30) 100 UNIT/ML injection Inject 36-50 Units into the skin See admin instructions. 50 units in the morning, 36 units in the evening    [provider]  insulin lispro (HUMALOG KWIKPEN) 100 UNIT/ML KwikPen Inject 0.1 mLs (10 Units total) into the skin 3 (three) times daily with meals. ICD 10 E10.42 Patient not taking: Reported on 03/31/2019 03/08/19   Gildardo Pounds, NP  Insulin Pen Needle (B-D UF III MINI PEN NEEDLES) 31G X 5 MM MISC Use as instructed. Inject into the skin four times daily. ICD 10 E10.42 03/08/19   Gildardo Pounds, NP  Multiple Vitamins-Minerals (MULTIVITAMIN WITH MINERALS) tablet Take 1 tablet by mouth daily.    [provider]  pravastatin (PRAVACHOL) 20 MG tablet Take 1 tablet (20 mg total) by mouth daily. Patient not taking: Reported on 03/31/2019 03/08/19   Gildardo Pounds, NP  TRUEplus Lancets 28G MISC Use as instructed. Check blood glucose level by fingerstick three per day. 03/08/19   Gildardo Pounds, NP    Allergies    Shellfish allergy  Review of Systems   Review of Systems  Gastrointestinal: Positive for vomiting.  All other systems reviewed and are negative.   Physical Exam Updated Vital Signs BP (!) 144/100   Pulse 75   Temp 97.8 F (36.6 C) (Oral)   Resp 19   SpO2 100%   Physical Exam Vitals and nursing note reviewed.  Constitutional:      General: He is not in acute distress.    Comments: Actively  vomiting   HENT:     Head: Normocephalic and atraumatic.  Eyes:     Conjunctiva/sclera: Conjunctivae normal.     Pupils: Pupils are equal, round, and reactive to light.  Cardiovascular:     Rate and Rhythm: Normal rate and regular rhythm.     Heart sounds: Normal heart sounds.  Pulmonary:     Effort: Pulmonary effort is normal. No respiratory distress.     Breath sounds: Normal breath sounds.  Abdominal:     General: There is no distension.     Palpations: Abdomen is soft.     Tenderness: There is no abdominal tenderness.  Musculoskeletal:        General: No deformity. Normal  range of motion.     Cervical back: Normal range of motion and neck supple.  Skin:    General: Skin is warm and dry.  Neurological:     Mental Status: He is alert and oriented to person, place, and time.     ED Results / Procedures / Treatments   Labs (all labs ordered are listed, but only abnormal results are displayed) Labs Reviewed  COMPREHENSIVE METABOLIC PANEL - Abnormal; Notable for the following components:      Result Value   CO2 20 (*)    Glucose, Bld 334 (*)    Creatinine, Ser 1.34 (*)    Calcium 10.4 (*)    Anion gap 16 (*)    All other components within normal limits  CBG MONITORING, ED - Abnormal; Notable for the following components:   Glucose-Capillary 325 (*)    All other components within normal limits  RESPIRATORY PANEL BY RT PCR (FLU A&B, COVID)  LIPASE, BLOOD  CBC  URINALYSIS, ROUTINE W REFLEX MICROSCOPIC    EKG None  Radiology No results found.  Procedures Procedures (including critical care time)  Medications Ordered in ED Medications  sodium chloride 0.9 % bolus 1,000 mL (has no administration in time range)  ondansetron (ZOFRAN) injection 4 mg (has no administration in time range)  insulin aspart (novoLOG) injection 8 Units (has no administration in time range)    ED Course  I have reviewed the triage vital signs and the nursing notes.  Pertinent labs &  imaging results that were available during my care of the patient were reviewed by me and considered in my medical decision making (see chart for details).    MDM Rules/Calculators/A&P                      MDM  Screen complete  [name] was evaluated in Emergency Department on 04/08/2019 for the symptoms described in the history of present illness. He was evaluated in the context of the global COVID-19 pandemic, which necessitated consideration that the patient might be at risk for infection with the SARS-CoV-2 virus that causes COVID-19. Institutional protocols and algorithms that pertain to the evaluation of patients at risk for COVID-19 are in a state of rapid change based on information released by regulatory bodies including the CDC and federal and state organizations. These policies and algorithms were followed during the patient's care in the ED.  Patient is presenting for evaluation of persistent vomiting that began last night.  Evaluation today suggest early DKA.  Patient with open anion gap and elevated blood glucose.  Patient's vomiting continued in the ED without significant improvement with antiemetics.  Patient would benefit from admission for further workup and treatment.   Hospitalist service is aware of case and will evaluate for admission.  Final Clinical Impression(s) / ED Diagnoses Final diagnoses:  Nausea and vomiting, intractability of vomiting not specified, unspecified vomiting type  Diabetic ketoacidosis without coma associated with type 1 diabetes mellitus ([place])    Rx / DC Orders ED Discharge Orders    None       Valarie Merino, MD 04/08/19 1512

## 2019-04-08 NOTE — ED Notes (Signed)
Dr. Willette Pa paged by this RN to ask if potassium is still indicated with insulin drip of 0.4units/hour and K of 4.4

## 2019-04-08 NOTE — ED Notes (Signed)
Per Dr. Willette Pa continue with giving the ordered 2-6mEq potassium chloride infusions.

## 2019-04-08 NOTE — ED Notes (Signed)
Pt CBG of 107. Endotool recommend CBG recheck in 30 minutes insulin drip at 0.

## 2019-04-08 NOTE — ED Notes (Signed)
Per Dr. Rodena Medin start D5 1/2 NSS while endotool ordered insulin drip of 0units/hour and hold IV potassium at this time since potassium is within normal limits.

## 2019-04-08 NOTE — ED Notes (Signed)
Patient asked to give urine sample. States that he is unable to give sample at this time.

## 2019-04-08 NOTE — H&P (Addendum)
History and Physical    [name] QQV:[id number] DOB: 1985-09-22 DOA: 04/08/2019  PCP: Patient, No Pcp Per  Patient coming from: home  I have personally briefly reviewed patient's old medical records in [place]  Chief Complaint:  HPI: [name] is a 34 y.o. male with medical history significant of Type 1 diabetes with episodes of hypoglycemia and multiple admissions to the hospital.  He has a past medical history significant for DKA, HIV, anxiety, asthma, migraines, and seizures who has multiple admissions to the hospital for hypoglycemia.  He states he gets his insulin from March without a prescription and gives himself approximately 50 units of insulin a day.  I had an extensive conversation with him and found out that he has food insecurity and lately has had changes in his appetite where he feels he can even eat even if he wants to.  On top of the anorexia he has had recently, he also works nights and has a very non regimented eating schedule.  He often comes home from work in the morning and tries to eat but then sometimes he just goes to sleep without eating subsequently waking up hypoglycemic. [place] the emergency department for evaluation of nausea and vomiting.  It began last night and he has not been able to keep anything down since.  He has had vague abdominal cramping but now complains of abdominal muscular pain associated with the vomiting.  He has had no fevers, chest pain, or shortness of breath.  His last use of insulin was early this morning.  ED Course: Multiple episodes of vomiting, CO2 low at 24, glucose elevated at 334, creatinine 1.34, calcium 10.4, anion gap 16.  Evaluation is consistent with early DKA.  Review of Systems: As per HPI otherwise all other systems reviewed and  negative.   Past Medical History:  Diagnosis Date  . Anxiety   . Asthma    no prior hospitalizations, intubations  . Depression   . Headache    "only w/stress" (11/01/2017)  . HIV  (human immunodeficiency virus infection) ([place])   . Migraine    "a few/month" (11/01/2017)  . Refusal of blood transfusions as patient is Jehovah's Witness   . Seizure ([place])    "only w/my low blood sugars"  (11/01/2017)  . Type I diabetes mellitus ([place])     Past Surgical History:  Procedure Laterality Date  . FRACTURE SURGERY    . IM NAILING TIBIA Right 11/01/2017   INTRAMEDULLARY (IM) NAIL TIBIALRightGeneral  . ORIF ANKLE FRACTURE Right 11/01/2017  . ORIF ANKLE FRACTURE Right 11/01/2017   Procedure: OPEN REDUCTION INTERNAL FIXATION (ORIF) ANKLE FRACTURE;  Surgeon: Shona Needles, MD;  Location: [place];  Service: Orthopedics;  Laterality: Right;  . TIBIA IM NAIL INSERTION Right 11/01/2017   Procedure: INTRAMEDULLARY (IM) NAIL TIBIAL;  Surgeon: Shona Needles, MD;  Location: [place];  Service: Orthopedics;  Laterality: Right;    Social History   Social History Narrative  . Not on file     reports that he has been smoking cigars. He started smoking about 3 years ago. He has never used smokeless tobacco. He reports current alcohol use of about 7.0 standard drinks of alcohol per week. He reports current drug use. Frequency: 2.00 times per week. Drug: Marijuana.  Allergies  Allergen Reactions  . Shellfish Allergy Anaphylaxis and Hives    Family History  Problem Relation Age of Onset  . Thyroid disease Mother   . Diabetes Mother   .  Breast cancer Maternal Grandmother   . Diabetes Maternal Grandmother     Prior to Admission medications   Medication Sig Start Date End Date Taking? Authorizing Provider  acetaminophen (TYLENOL) 500 MG tablet Take 1 tablet (500 mg total) by mouth every 6 (six) hours as needed. 02/10/15  Yes Dorena Dew, FNP  citalopram (CELEXA) 20 MG tablet TAKE 1 TABLET BY MOUTH DAILY Patient taking differently: Take 20 mg by mouth daily.  02/06/19  Yes Comer, Okey Regal, MD  feeding supplement (BOOST HIGH PROTEIN) LIQD Take 1 Container by mouth 2 (two) times daily as  needed (supplement).    Yes [provider]  GENVOYA 150-150-200-10 MG TABS tablet TAKE 1 TABLET BY MOUTH DAILY Patient taking differently: Take 1 tablet by mouth daily with breakfast.  11/13/18  Yes Comer, Okey Regal, MD  ibuprofen (ADVIL,MOTRIN) 200 MG tablet Take 600 mg by mouth every 6 (six) hours as needed for moderate pain.   Yes [provider]  insulin aspart protamine- aspart (NOVOLOG MIX 70/30) (70-30) 100 UNIT/ML injection Inject 36-50 Units into the skin See admin instructions. 50 units in the morning, 36 units in the evening   Yes [provider]  Multiple Vitamins-Minerals (MULTIVITAMIN WITH MINERALS) tablet Take 1 tablet by mouth daily.   Yes [provider]  Blood Glucose Monitoring Suppl (TRUE METRIX METER) w/Device KIT Use as instructed. Check blood glucose level by fingerstick three times per day. 03/08/19   Gildardo Pounds, NP  glucose blood (TRUE METRIX BLOOD GLUCOSE TEST) test strip Use as instructed 03/08/19   Gildardo Pounds, NP  insulin lispro (HUMALOG KWIKPEN) 100 UNIT/ML KwikPen Inject 0.1 mLs (10 Units total) into the skin 3 (three) times daily with meals. ICD 10 E10.42 Patient not taking: Reported on 03/31/2019 03/08/19   Gildardo Pounds, NP  Insulin Pen Needle (B-D UF III MINI PEN NEEDLES) 31G X 5 MM MISC Use as instructed. Inject into the skin four times daily. ICD 10 E10.42 03/08/19   Gildardo Pounds, NP  pravastatin (PRAVACHOL) 20 MG tablet Take 1 tablet (20 mg total) by mouth daily. Patient not taking: Reported on 03/31/2019 03/08/19   Gildardo Pounds, NP  TRUEplus Lancets 28G MISC Use as instructed. Check blood glucose level by fingerstick three per day. 03/08/19   Gildardo Pounds, NP    Physical Exam:  Constitutional: NAD, calm, uncomfortable Vitals:   04/08/19 1222 04/08/19 1400 04/08/19 1430 04/08/19 1451  BP: (!) 144/100 (!) 156/100  118/66  Pulse: 75  65 69  Resp:  _0 Temp:      TempSrc:      SpO2:   97% 97%   Eyes:  PERRL, lids and conjunctivae normal ENMT: Mucous membranes are dry. Posterior pharynx clear of any exudate or lesions.Normal dentition.  Neck: normal, supple, no masses, no thyromegaly Respiratory: clear to auscultation bilaterally, no wheezing, no crackles. Normal respiratory effort. No accessory muscle use.  Cardiovascular: Regular rate and rhythm, no murmurs / rubs / gallops. No extremity edema. 2+ pedal pulses. No carotid bruits.  Abdomen: Mild muscular tenderness worse with vomiting and palpation, no masses palpated. No hepatosplenomegaly. Bowel sounds positive.  Musculoskeletal: no clubbing / cyanosis. No joint deformity upper and lower extremities. Good ROM, no contractures. Normal muscle tone.  Skin: no rashes, lesions, ulcers. No induration Neurologic: CN 2-12 grossly intact. Sensation intact, DTR normal. Strength 5/5 in all 4.  Psychiatric: Normal judgment and insight. Alert and oriented x 3. Normal  mood.    Labs on Admission: I have personally reviewed following labs and imaging studies  CBC: Recent Labs  Lab 04/08/19 1231  WBC 7.6  HGB 16.0  HCT 45.7  MCV 90.1  PLT 440   Basic Metabolic Panel: Recent Labs  Lab 04/08/19 1231  NA 136  K 4.4  CL 100  CO2 20*  GLUCOSE 334*  BUN 19  CREATININE 1.34*  CALCIUM 10.4*   GFR: Estimated Creatinine Clearance: 86.9 mL/min (A) (by C-G formula based on SCr of 1.34 mg/dL (H)). Liver Function Tests: Recent Labs  Lab 04/08/19 1231  AST 19  ALT 22  ALKPHOS 73  BILITOT 0.7  PROT 7.8  ALBUMIN 4.3   Recent Labs  Lab 04/08/19 1231  LIPASE 18   CBG: Recent Labs  Lab 04/08/19 1226 04/08/19 1521 04/08/19 1523 04/08/19 1610  GLUCAP 325* 117* 107* 81   Urine analysis:    Component Value Date/Time   COLORURINE STRAW (A) 04/20/2018 1500   APPEARANCEUR CLEAR 04/20/2018 1500   LABSPEC 1.025 04/20/2018 1500   PHURINE 5.0 04/20/2018 1500   GLUCOSEU >=500 (A) 04/20/2018 1500   HGBUR NEGATIVE 04/20/2018 1500    BILIRUBINUR NEGATIVE 04/20/2018 1500   KETONESUR 20 (A) 04/20/2018 1500   PROTEINUR NEGATIVE 04/20/2018 1500   UROBILINOGEN 0.2 02/10/2015 1416   NITRITE NEGATIVE 04/20/2018 1500   LEUKOCYTESUR NEGATIVE 04/20/2018 1500    Radiological Exams on Admission: No results found.    Assessment/Plan Active Problems:   DKA (diabetic ketoacidoses) (H[facility])    Impression: 1. DKA: Patient will be admitted to the hospital with DKA.  We will continue an insulin drip.  His potassium levels are normal and we will replete as necessary given the fact that we will be giving him an insulin drip.  Check repeat BMPs based on protocol.  I believe patient's issues seem mostly socially determined.  He works nights but has food insecurity.  We will consult diabetes educator for assistance with determining a plan that he can leave here with will help him keep himself from becoming too hypoglycemic as he is frequently admitted for that as well as keep him out of DKA.  I believe a more regimented mealtime and insulin routine will help keep the patient stable.  I also request that he be evaluated and leave here with a close follow-up appointment perhaps in the transitional care clinic.  DVT prophylaxis: Lovenox  Code Status: DO NOT RESUSCITATE Family Communication: None as patient is his own decision maker Disposition Plan: Probable discharge in 32 hours Consults called: None Admission status: Inpatient Admit - It is my clinical opinion that admission to INPATIENT is reasonable and necessary because of the expectation that this patient will require hospital care that crosses at least 2 midnights to treat this condition based on the medical complexity of the problems presented.  Given the aforementioned information, the predictability of an adverse outcome is felt to be significant.    Lady Deutscher MD FACP [facility] Pager [phone]  How to contact the [facility] Attending or Consulting provider [place] or  covering provider during after hours [place], for this patient?  1. Check the care team in [facility] and look for a) attending/consulting TRH provider listed and b) the [facility] team listed 2. Log into [email or web address] and use [place]'s universal password to access. If you do not have the password, please contact the hospital operator. 3. Locate the [facility] provider you are looking for under [facility]  and page to a number that you can be directly reached. 4. If you still have difficulty reaching the provider, please page the [facility] (Director on Call) for the Hospitalists listed on amion for assistance.  If 7PM-7AM, please contact night-coverage [email or web address] Password [facility]  04/08/2019, 4:54 PM

## 2019-04-08 NOTE — ED Triage Notes (Signed)
Pt reports upper abd pain after multiple times of vomiting. N/V/D since yesterday.

## 2019-04-09 DIAGNOSIS — R112 Nausea with vomiting, unspecified: Secondary | ICD-10-CM

## 2019-04-09 DIAGNOSIS — E1010 Type 1 diabetes mellitus with ketoacidosis without coma: Secondary | ICD-10-CM

## 2019-04-09 LAB — GLUCOSE, CAPILLARY
Glucose-Capillary: 109 mg/dL — ABNORMAL HIGH (ref 70–99)
Glucose-Capillary: 106 mg/dL — ABNORMAL HIGH (ref 70–99)
Glucose-Capillary: 107 mg/dL — ABNORMAL HIGH (ref 70–99)
Glucose-Capillary: 101 mg/dL — ABNORMAL HIGH (ref 70–99)
Glucose-Capillary: 126 mg/dL — ABNORMAL HIGH (ref 70–99)
Glucose-Capillary: 152 mg/dL — ABNORMAL HIGH (ref 70–99)
Glucose-Capillary: 150 mg/dL — ABNORMAL HIGH (ref 70–99)
Glucose-Capillary: 149 mg/dL — ABNORMAL HIGH (ref 70–99)
Glucose-Capillary: 124 mg/dL — ABNORMAL HIGH (ref 70–99)
Glucose-Capillary: 86 mg/dL (ref 70–99)
Glucose-Capillary: 190 mg/dL — ABNORMAL HIGH (ref 70–99)

## 2019-04-09 LAB — FERRITIN: Ferritin: 147 ng/mL (ref 24–336)

## 2019-04-09 LAB — BASIC METABOLIC PANEL
Anion gap: 12 (ref 5–15)
BUN: 9 mg/dL (ref 6–20)
CO2: 20 mmol/L — ABNORMAL LOW (ref 22–32)
Calcium: 9.4 mg/dL (ref 8.9–10.3)
Chloride: 109 mmol/L (ref 98–111)
Creatinine, Ser: 1.21 mg/dL (ref 0.61–1.24)
GFR calc Af Amer: >60 mL/min (ref >60)
GFR calc non Af Amer: >60 mL/min (ref >60)
Glucose, Bld: 89 mg/dL (ref 70–99)
Potassium: 3.5 mmol/L (ref 3.5–5.1)
Sodium: 141 mmol/L (ref 135–145)

## 2019-04-09 LAB — CBC
HCT: 47.9 % (ref 39.0–52.0)
Hemoglobin: 16.3 g/dL (ref 13.0–17.0)
MCH: 31.2 pg (ref 26.0–34.0)
MCHC: 34 g/dL (ref 30.0–36.0)
MCV: 91.8 fL (ref 80.0–100.0)
Platelets: 289 10*3/uL (ref 150–400)
RBC: 5.22 MIL/uL (ref 4.22–5.81)
RDW: 13.1 % (ref 11.5–15.5)
WBC: 7.9 10*3/uL (ref 4.0–10.5)
nRBC: 0 % (ref 0.0–0.2)

## 2019-04-09 LAB — C-REACTIVE PROTEIN: CRP: 0.5 mg/dL (ref <1.0)

## 2019-04-09 LAB — SARS CORONAVIRUS 2 (TAT 6-24 HRS): SARS Coronavirus 2: NEGATIVE

## 2019-04-09 MED ORDER — INSULIN GLARGINE 100 UNIT/ML ~~LOC~~ SOLN
15.0000 [IU] | Freq: Every day | SUBCUTANEOUS | Status: DC
  Administered 2019-04-09: 15 [IU] via SUBCUTANEOUS
  Filled 2019-04-09 (×2): qty 0.15

## 2019-04-09 MED ORDER — INSULIN ASPART 100 UNIT/ML ~~LOC~~ SOLN: 0.0000 [IU] | Freq: Every day | SUBCUTANEOUS | Status: DC

## 2019-04-09 MED ORDER — INSULIN ASPART 100 UNIT/ML ~~LOC~~ SOLN
0.0000 [IU] | Freq: Three times a day (TID) | SUBCUTANEOUS | Status: DC
  Administered 2019-04-09: 1 [IU] via SUBCUTANEOUS

## 2019-04-09 NOTE — Progress Notes (Signed)
Inpatient Diabetes Program Recommendations  AACE/ADA: New Consensus Statement on Inpatient Glycemic Control (2015)  Target Ranges:  Prepandial:   less than 140 mg/dL      Peak postprandial:   less than 180 mg/dL (1-2 hours)      Critically ill patients:  140 - 180 mg/dL   Lab Results  Component Value Date   GLUCAP 150 (H) 04/09/2019   HGBA1C 13.6 (H) 04/21/2018    Review of Glycemic Control Results for [name], [name] (MRN [id number]) as of 04/09/2019 09:52  Ref. Range 04/09/2019 03:08 04/09/2019 04:32 04/09/2019 06:02 04/09/2019 08:17 04/09/2019 09:20  Glucose-Capillary Latest Ref Range: 70 - 99 mg/dL 226 (H) 333 (H) 545 (H) 152 (H) 150 (H)   Diabetes history: DM 1 Outpatient Diabetes medications:  Novolin 70/30 50 units in the AM and 36 units in the PM Current orders for Inpatient glycemic control:  IV insulin- DKA order set Inpatient Diabetes Program Recommendations:    Note that patient admitted with DKA- Unclear of cause.  Note that patient has been having difficulties with low blood sugars and possibly has not been taking insulin as ordered due to fear of hypoglycemia.  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 He was recently admitted with low blood sugars and states that he's not sure what happened but he woke up in the ambulance. When I asked about his current living arrangements, he states "I don't want to talk about that right now".     Patient was seen by [facility] MD on 1/8 and has prescriptions available at the [facility] pharmacy including meter, strips, Lantus solostar, Humalog, and pen needles.  His cost for pick up will be 32$.  Pharmacy rep states that if doses changed please have MD update the rx. And send to [facility] pharmacy.    I discussed with patient that Lantus/Humalog regimen will likely be safer due to the ability to split out the basal insulin from the rapid acting/meal coverage. He states that he is willing to try it.  Explained that he will  need to always take Lantus and take Humalog with meals/food.  He verbalized understanding.  Will follow.   Thanks,  Beryl Meager, RN, BC-ADM Inpatient Diabetes Coordinator Pager [phone] (8a-5p)

## 2019-04-09 NOTE — Progress Notes (Addendum)
Pt refuses labs again, MD notified. This RN asks pt if he ordered lunch with menu provided. Pt shakes head "No". Pt asked if he would like assistance ordering food. Pt responds by asking about his cell phone whereabouts.

## 2019-04-09 NOTE — Progress Notes (Addendum)
PROGRESS NOTE    [name]  EHU:[id number] DOB: 1985-08-05 DOA: 04/08/2019 PCP: Patient, No Pcp Per  Brief Narrative: : [name] is a 34 y.o. male with medical history significant of Type 1 diabetes with episodes of hypoglycemia and multiple admissions to the hospital.  He has a past medical history significant for DKA, HIV, anxiety, asthma, migraines, and seizures who has multiple admissions to the hospital for hypoglycemia.  He states he gets his insulin from March without a prescription and gives himself approximately 50 units of insulin a day he presented to the emergency room yesterday afternoon with malaise, nausea vomiting and diarrhea, also complained of cramping abdominal discomfort. -Denied any fevers chills cough congestion etc. -Work-up in the ED noted mildly low CO2 at 20 and blood glucose of 330, creatinine was 1.3, admitting MD was concerned about early DKA and he was admitted   Assessment & Plan:   Nausea vomiting diarrhea Low-grade fever -He was treated per DKA protocol on admission yesterday, I suspect his mild metabolic acidosis was likely secondary to acute kidney injury rather than DKA -No further vomiting, did have a temp of 100.4 overnight -Covid test were negative on admission,  will repeat PCR,, check CRP/ferritin -Abdominal exam is benign today -Supportive care, start a diet -Unfortunately patient would not allow morning labs today as his cell phone is missing and he is upset  Type 1 diabetes mellitus -Currently taking insulin 70/30, recently seen at [facility] and January and was prescribed Lantus and Humalog -Restart Lantus, sliding scale -Anion gap and CO2 level was normal yesterday evening  HIV -Followed by infectious disease, last CD4 count was greater than 500 with undetectable viral load -Continue Genvoya  DVT prophylaxis: Code Status:  Family Communication: Disposition Plan:   Consultants:      Procedures:    Antimicrobials:    Subjective: -Patient is very upset that he lost his cell phone yesterday evening while being transported to the room, now refuses any labs  Objective: Vitals:   04/08/19 2318 04/09/19 0309 04/09/19 0818 04/09/19 0900  BP: 137/87 131/85    Pulse:  63  63  Resp:    16  Temp: 100 F (37.8 C) 98.5 F (36.9 C) 97.7 F (36.5 C)   TempSrc: Oral Oral Oral   SpO2:    99%    Intake/Output Summary (Last 24 hours) at 04/09/2019 1031 Last data filed at 04/09/2019 0119 Gross per 24 hour  Intake 1000 ml  Output 375 ml  Net 625 ml   There were no vitals filed for this visit.  Examination:  General exam: AAOx3, ill-appearing respiratory system: Clear Cardiovascular system: S1 & S2 heard, Gastrointestinal system: Abdomen is nondistended, soft and nontender.Normal bowel sounds heard. Central nervous system: Alert and oriented. No focal neurological deficits. Extremities: No edema Skin: No rashes, lesions or ulcers Psychiatry: Angry, upset    Data Reviewed:   CBC: Recent Labs  Lab 04/08/19 1231 04/08/19 1700  WBC 7.6 9.6  HGB 16.0 16.0  HCT 45.7 46.6  MCV 90.1 91.6  PLT 317 239   Basic Metabolic Panel: Recent Labs  Lab 04/08/19 1231 04/08/19 1700 04/08/19 2025  NA 136 137 138  K 4.4 3.9 3.9  CL 100 104 106  CO2 20* 21* 24  GLUCOSE 334* 80 84  BUN 19 18 13   CREATININE 1.34* 1.08 1.13  CALCIUM 10.4* 9.5 9.3   GFR: Estimated Creatinine Clearance: 103 mL/min (by C-G formula based on SCr of 1.13 mg/dL).  Liver Function Tests: Recent Labs  Lab 04/08/19 1231  AST 19  ALT 22  ALKPHOS 73  BILITOT 0.7  PROT 7.8  ALBUMIN 4.3   Recent Labs  Lab 04/08/19 1231  LIPASE 18   No results for input(s): AMMONIA in the last 168 hours. Coagulation Profile: No results for input(s): INR, PROTIME in the last 168 hours. Cardiac Enzymes: No results for input(s): CKTOTAL, CKMB, CKMBINDEX, TROPONINI in the last 168 hours. BNP (last 3 results) No results for  input(s): PROBNP in the last 8760 hours. HbA1C: No results for input(s): HGBA1C in the last 72 hours. CBG: Recent Labs  Lab 04/09/19 0308 04/09/19 0432 04/09/19 0602 04/09/19 0817 04/09/19 0920  GLUCAP 106* 101* 126* 152* 150*   Lipid Profile: No results for input(s): CHOL, HDL, LDLCALC, TRIG, CHOLHDL, LDLDIRECT in the last 72 hours. Thyroid Function Tests: No results for input(s): TSH, T4TOTAL, FREET4, T3FREE, THYROIDAB in the last 72 hours. Anemia Panel: No results for input(s): VITAMINB12, FOLATE, FERRITIN, TIBC, IRON, RETICCTPCT in the last 72 hours. Urine analysis:    Component Value Date/Time   COLORURINE YELLOW 04/08/2019 1900   APPEARANCEUR CLEAR 04/08/2019 1900   LABSPEC 1.024 04/08/2019 1900   PHURINE 7.0 04/08/2019 1900   GLUCOSEU >=500 (A) 04/08/2019 1900   HGBUR SMALL (A) 04/08/2019 1900   BILIRUBINUR NEGATIVE 04/08/2019 1900   KETONESUR 20 (A) 04/08/2019 1900   PROTEINUR 100 (A) 04/08/2019 1900   UROBILINOGEN 0.2 02/10/2015 1416   NITRITE NEGATIVE 04/08/2019 1900   LEUKOCYTESUR NEGATIVE 04/08/2019 1900   Sepsis Labs: @LABRCNTIP (procalcitonin:4,lacticidven:4)  ) Recent Results (from the past 240 hour(s))  SARS CORONAVIRUS 2 (TAT 6-24 HRS) Nasopharyngeal Nasopharyngeal Swab     Status: None   Collection Time: 03/31/19  2:17 AM   Specimen: Nasopharyngeal Swab  Result Value Ref Range Status   SARS Coronavirus 2 NEGATIVE NEGATIVE Final    Comment: (NOTE) SARS-CoV-2 target nucleic acids are NOT DETECTED. The SARS-CoV-2 RNA is generally detectable in upper and lower respiratory specimens during the acute phase of infection. Negative results do not preclude SARS-CoV-2 infection, do not rule out co-infections with other pathogens, and should not be used as the sole basis for treatment or other patient management decisions. Negative results must be combined with clinical observations, patient history, and epidemiological information. The expected result is  Negative. Fact Sheet for Patients: 04/02/19 Fact Sheet for Healthcare Providers: HairSlick.no This test is not yet approved or cleared by the quierodirigir.com FDA and  has been authorized for detection and/or diagnosis of SARS-CoV-2 by FDA under an Emergency Use Authorization (EUA). This EUA will remain  in effect (meaning this test can be used) for the duration of the COVID-19 declaration under Section 56 4(b)(1) of the Act, 21 U.S.C. section 360bbb-3(b)(1), unless the authorization is terminated or revoked sooner. Performed at [facility], [address] [place], [place]   Respiratory Panel by RT PCR (Flu A&B, Covid) - Nasopharyngeal Swab     Status: None   Collection Time: 04/08/19  1:57 PM   Specimen: Nasopharyngeal Swab  Result Value Ref Range Status   SARS Coronavirus 2 by RT PCR NEGATIVE NEGATIVE Final    Comment: (NOTE) SARS-CoV-2 target nucleic acids are NOT DETECTED. The SARS-CoV-2 RNA is generally detectable in upper respiratoy specimens during the acute phase of infection. The lowest concentration of SARS-CoV-2 viral copies this assay can detect is 131 copies/mL. A negative result does not preclude SARS-Cov-2 infection and should not be used as  the sole basis for treatment or other patient management decisions. A negative result may occur with  improper specimen collection/handling, submission of specimen other than nasopharyngeal swab, presence of viral mutation(s) within the areas targeted by this assay, and inadequate number of viral copies (<131 copies/mL). A negative result must be combined with clinical observations, patient history, and epidemiological information. The expected result is Negative. Fact Sheet for Patients:  PinkCheek.be Fact Sheet for Healthcare Providers:  GravelBags.it This test is not yet ap proved or cleared  by the [place] FDA and  has been authorized for detection and/or diagnosis of SARS-CoV-2 by FDA under an Emergency Use Authorization (EUA). This EUA will remain  in effect (meaning this test can be used) for the duration of the COVID-19 declaration under Section 564(b)(1) of the Act, 21 U.S.C. section 360bbb-3(b)(1), unless the authorization is terminated or revoked sooner.    Influenza A by PCR NEGATIVE NEGATIVE Final   Influenza B by PCR NEGATIVE NEGATIVE Final    Comment: (NOTE) The Xpert Xpress SARS-CoV-2/FLU/RSV assay is intended as an aid in  the diagnosis of influenza from Nasopharyngeal swab specimens and  should not be used as a sole basis for treatment. Nasal washings and  aspirates are unacceptable for Xpert Xpress SARS-CoV-2/FLU/RSV  testing. Fact Sheet for Patients: PinkCheek.be Fact Sheet for Healthcare Providers: GravelBags.it This test is not yet approved or cleared by the [place] FDA and  has been authorized for detection and/or diagnosis of SARS-CoV-2 by  FDA under an Emergency Use Authorization (EUA). This EUA will remain  in effect (meaning this test can be used) for the duration of the  Covid-19 declaration under Section 564(b)(1) of the Act, 21  U.S.C. section 360bbb-3(b)(1), unless the authorization is  terminated or revoked. Performed at [facility], [place] [address], [place],  [address]          Radiology Studies: No results found.      Scheduled Meds: . citalopram  20 mg Oral Daily  . elvitegravir-cobicistat-emtricitabine-tenofovir  1 tablet Oral Q breakfast  . enoxaparin (LOVENOX) injection  40 mg Subcutaneous Q24H  . insulin glargine  15 Units Subcutaneous Daily  . multivitamin with minerals  1 tablet Oral Daily   Continuous Infusions: . sodium chloride 75 mL/hr at 04/09/19 0111  . dextrose 5 % and 0.45% NaCl 75 mL/hr at 04/09/19 0255  . insulin 0.7  Units/hr (04/09/19 0828)     LOS: 1 day    Time spent: 53min    Domenic Polite, MD [facility] 04/09/2019, 10:31 AM

## 2019-04-09 NOTE — Progress Notes (Signed)
Pt has been refusing to get his labs drawn all night despite being educated on the importance of it. He needs his BMP drawn due to the fact that he is on an insulin gtt. MD has been paged and made aware of it and I will continue to monitor.

## 2019-04-09 NOTE — Progress Notes (Signed)
Central monitoring notified this RN pt off telemetry monitoring. Pt observed walking around room with leads off. This RN requested to reapply monitoring leads. Pt refusing to have leads reapplied "Until he is done walking around".

## 2019-04-09 NOTE — Progress Notes (Signed)
Pt refusing labs this AM. MD paged.

## 2019-04-10 LAB — HEMOGLOBIN A1C
Hgb A1c MFr Bld: 9.9 % — ABNORMAL HIGH (ref 4.8–5.6)
Mean Plasma Glucose: 237 mg/dL

## 2019-04-10 MED FILL — ?HUMALOG 100 UNITS/ML KWIKP: 100 | 30 days supply | Qty: 9 | Fill #0

## 2019-04-10 MED FILL — ?BASAGLAR 100 UNITS/ML KWPE: 100 | 30 days supply | Qty: 12 | Fill #0

## 2019-04-10 NOTE — Progress Notes (Signed)
Pt decided to leave against medical advice (AMA) due to him no longer being on the insulin gtt as well as his blood sugars starting beginning to regulate. Document is in the chart and MD is aware. Pt was also upset about his cell phone not being found. Pt stated he is going to come back to the hospital tomorrow to talk to patient relations regarding the issue.

## 2019-04-17 NOTE — Discharge Summary (Signed)
Physician Discharge Summary  [name] WEX:[id number] DOB: Jan 15, 1986 DOA: 04/08/2019  PCP: Patient, No Pcp Per  Admit date: 04/08/2019 Discharge date: 04/09/2019  Time spent: 5mnutes  Recommendations for Outpatient Follow-up:  [place]  Discharge Diagnoses:  Active Problems:   [place] ([place]oses[place]   HIV   There were no vitals filed for this visit.  History of present illness:  [name] a 34y.o.malewith medical history significant ofType 1 diabetes with episodes of hypoglycemia and multiple admissions to the hospital. He has a past medical history significant for DKA,HIV, anxiety, asthma, migraines, and seizures who has multiple admissions to the hospital for hypoglycemia. He states he gets his insulin from March without a prescription and gives himself approximately 50 units of insulin a day he presented to the emergency room yesterday afternoon with malaise, nausea vomiting and diarrhea, also complained of cramping abdominal discomfort. -Denied any fevers chills cough congestion etc. -Work-up in the ED noted mildly low CO2 at 20 and blood glucose of 330, creatinine was 1.3, admitting MD was concerned about early DKA and he was admitted  Hospital Course:   Nausea vomiting diarrhea Low-grade fever -He was treated per DKA protocol on admission yesterday, I suspect his mild metabolic acidosis was likely secondary to acute kidney injury rather than DKA -No further vomiting, did have a temp of 100.4 overnight -Covid test were negative on admission,  ordered repeat -Abdominal exam was benign -Unfortunately patient would not allow morning labs today as his cell phone is missing and he is upset -Eventually decided to leave AGAINST MEDICAL ADVICE  Type 1 diabetes mellitus -Currently taking insulin 70/30, [facility] and was prescribed Lantus and Humalog -Restart Lantus, sliding  scale  HIV -Followed by infectious disease, last CD4 count was greater than 500 with undetectable viral load -Continue Genvoya    Discharge Exam: Vitals:   04/09/19 1735 04/09/19 1919  BP: (!) 149/101 (!) 155/95  Pulse: 74   Resp: 18 15  Temp: 99.2 F (37.3 C) 100 F (37.8 C)  SpO2: 100% 98%    General: AAOx3 Cardiovascular: S1S2/RRR Respiratory: CTAB  Discharge Instructions    Allergies as of 04/09/2019      Reactions   Shellfish Allergy Anaphylaxis, Hives      Medication List    ASK your doctor about these medications   acetaminophen 500 MG tablet Commonly known as: TYLENOL Take 1 tablet (500 mg total) by mouth every 6 (six) hours as needed.   B-D UF III MINI PEN NEEDLES 31G X 5 MM Misc Generic drug: Insulin Pen Needle Use as instructed. Inject into the skin four times daily. ICD 10 E10.42   citalopram 20 MG tablet Commonly known as: CELEXA TAKE 1 TABLET BY MOUTH DAILY   feeding supplement Liqd Take 1 Container by mouth 2 (two) times daily as needed (supplement).   Genvoya 150-150-200-10 MG Tabs tablet Generic drug: elvitegravir-cobicistat-emtricitabine-tenofovir TAKE 1 TABLET BY MOUTH DAILY   ibuprofen 200 MG tablet Commonly known as: ADVIL Take 600 mg by mouth every 6 (six) hours as needed for moderate pain.   insulin aspart protamine- aspart (70-30) 100 UNIT/ML injection Commonly known as: NOVOLOG MIX 70/30 Inject 36-50 Units into the skin See admin instructions. 50 units in the morning, 36 units in the evening   insulin lispro 100 UNIT/ML KwikPen Commonly known as: HumaLOG KwikPen Inject 0.1 mLs (10 Units total) into the skin 3 (three) times daily with meals.  ICD 10 E10.42   multivitamin with minerals tablet Take 1 tablet by mouth daily.   pravastatin 20 MG tablet Commonly known as: PRAVACHOL Take 1 tablet (20 mg total) by mouth daily.   True Metrix Blood Glucose Test test strip Generic drug: glucose blood Use as instructed   True  Metrix Meter w/Device Kit Use as instructed. Check blood glucose level by fingerstick three times per day.   TRUEplus Lancets 28G Misc Use as instructed. Check blood glucose level by fingerstick three per day.      Allergies  Allergen Reactions  . Shellfish Allergy Anaphylaxis and Hives      The results of significant diagnostics from this hospitalization (including imaging, microbiology, ancillary and laboratory) are listed below for reference.    Significant Diagnostic Studies: No results found.  Microbiology: Recent Results (from the past 240 hour(s))  Respiratory Panel by RT PCR (Flu A&B, Covid) - Nasopharyngeal Swab     Status: None   Collection Time: 04/08/19  1:57 PM   Specimen: Nasopharyngeal Swab  Result Value Ref Range Status   SARS Coronavirus 2 by RT PCR NEGATIVE NEGATIVE Final    Comment: (NOTE) SARS-CoV-2 target nucleic acids are NOT DETECTED. The SARS-CoV-2 RNA is generally detectable in upper respiratoy specimens during the acute phase of infection. The lowest concentration of SARS-CoV-2 viral copies this assay can detect is 131 copies/mL. A negative result does not preclude SARS-Cov-2 infection and should not be used as the sole basis for treatment or other patient management decisions. A negative result may occur with  improper specimen collection/handling, submission of specimen other than nasopharyngeal swab, presence of viral mutation(s) within the areas targeted by this assay, and inadequate number of viral copies (<131 copies/mL). A negative result must be combined with clinical observations, patient history, and epidemiological information. The expected result is Negative. Fact Sheet for Patients:  PinkCheek.be Fact Sheet for Healthcare Providers:  GravelBags.it This test is not yet ap proved or cleared by the [place] FDA and  has been authorized for detection and/or diagnosis of  SARS-CoV-2 by FDA under an Emergency Use Authorization (EUA). This EUA will remain  in effect (meaning this test can be used) for the duration of the COVID-19 declaration under Section 564(b)(1) of the Act, 21 U.S.C. section 360bbb-3(b)(1), unless the authorization is terminated or revoked sooner.    Influenza A by PCR NEGATIVE NEGATIVE Final   Influenza B by PCR NEGATIVE NEGATIVE Final    Comment: (NOTE) The Xpert Xpress SARS-CoV-2/FLU/RSV assay is intended as an aid in  the diagnosis of influenza from Nasopharyngeal swab specimens and  should not be used as a sole basis for treatment. Nasal washings and  aspirates are unacceptable for Xpert Xpress SARS-CoV-2/FLU/RSV  testing. Fact Sheet for Patients: PinkCheek.be Fact Sheet for Healthcare Providers: GravelBags.it This test is not yet approved or cleared by the [place] FDA and  has been authorized for detection and/or diagnosis of SARS-CoV-2 by  FDA under an Emergency Use Authorization (EUA). This EUA will remain  in effect (meaning this test can be used) for the duration of the  Covid-19 declaration under Section 564(b)(1) of the Act, 21  U.S.C. section 360bbb-3(b)(1), unless the authorization is  terminated or revoked. Performed at [facility], [place] [address], [place], [place] [address]   SARS CORONAVIRUS 2 (TAT 6-24 HRS) Nasopharyngeal Nasopharyngeal Swab     Status: None   Collection Time: 04/09/19  8:07 AM   Specimen: Nasopharyngeal Swab  Result Value  Ref Range Status   SARS Coronavirus 2 NEGATIVE NEGATIVE Final    Comment: (NOTE) SARS-CoV-2 target nucleic acids are NOT DETECTED. The SARS-CoV-2 RNA is generally detectable in upper and lower respiratory specimens during the acute phase of infection. Negative results do not preclude SARS-CoV-2 infection, do not rule out co-infections with other pathogens, and should not be used as the sole basis for  treatment or other patient management decisions. Negative results must be combined with clinical observations, patient history, and epidemiological information. The expected result is Negative. Fact Sheet for Patients: SugarRoll.be Fact Sheet for Healthcare Providers: https://www.woods-mathews.com/ This test is not yet approved or cleared by the [place] FDA and  has been authorized for detection and/or diagnosis of SARS-CoV-2 by FDA under an Emergency Use Authorization (EUA). This EUA will remain  in effect (meaning this test can be used) for the duration of the COVID-19 declaration under Section 56 4(b)(1) of the Act, 21 U.S.C. section 360bbb-3(b)(1), unless the authorization is terminated or revoked sooner. Performed at [facility], [place] [address], [place], [place] [address]      Labs: Basic Metabolic Panel: No results for input(s): NA, K, CL, CO2, GLUCOSE, BUN, CREATININE, CALCIUM, MG, PHOS in the last 168 hours. Liver Function Tests: No results for input(s): AST, ALT, ALKPHOS, BILITOT, PROT, ALBUMIN in the last 168 hours. No results for input(s): LIPASE, AMYLASE in the last 168 hours. No results for input(s): AMMONIA in the last 168 hours. CBC: No results for input(s): WBC, NEUTROABS, HGB, HCT, MCV, PLT in the last 168 hours. Cardiac Enzymes: No results for input(s): CKTOTAL, CKMB, CKMBINDEX, TROPONINI in the last 168 hours. BNP: BNP (last 3 results) No results for input(s): BNP in the last 8760 hours.  ProBNP (last 3 results) No results for input(s): PROBNP in the last 8760 hours.  CBG: No results for input(s): GLUCAP in the last 168 hours.     Signed:  Domenic Polite MD.  [facility] 04/17/2019, 3:40 PM

## 2019-05-19 ENCOUNTER — Other Ambulatory Visit: Payer: Self-pay | Admitting: Internal Medicine

## 2019-05-19 DIAGNOSIS — B20 Human immunodeficiency virus [HIV] disease: Secondary | ICD-10-CM

## 2019-05-22 MED FILL — ?BASAGLAR 100 UNITS/ML KWPE: 100 | 30 days supply | Qty: 12 | Fill #1

## 2019-05-22 MED FILL — ?HUMALOG 100 UNITS/ML KWIKP: 100 | 30 days supply | Qty: 9 | Fill #1

## 2019-07-03 MED FILL — ?BASAGLAR 100 UNITS/ML KWPE: 100 | 30 days supply | Qty: 12 | Fill #2

## 2019-07-05 ENCOUNTER — Ambulatory Visit: Payer: Self-pay

## 2019-07-05 ENCOUNTER — Encounter: Payer: Self-pay | Admitting: Internal Medicine

## 2019-07-05 ENCOUNTER — Other Ambulatory Visit: Payer: Self-pay

## 2019-07-05 ENCOUNTER — Telehealth: Payer: Self-pay | Admitting: Pharmacy Technician

## 2019-07-05 NOTE — Telephone Encounter (Signed)
[facility] Patient Advocate Encounter  Patient's adap expired and he has already received the 30-day [employer] Advancing Access coverage. We are only able to apply once we have a denial letter and income documentation.

## 2019-07-08 ENCOUNTER — Other Ambulatory Visit: Payer: Self-pay

## 2019-07-08 ENCOUNTER — Encounter (HOSPITAL_COMMUNITY): Payer: Self-pay

## 2019-07-08 ENCOUNTER — Ambulatory Visit (HOSPITAL_COMMUNITY)
Admission: EM | Admit: 2019-07-08 | Discharge: 2019-07-08 | Disposition: A | Discharge status: Home / Self Care | Payer: HRSA Program | Attending: Internal Medicine | Admitting: Internal Medicine

## 2019-07-08 DIAGNOSIS — F17210 Nicotine dependence, cigarettes, uncomplicated: Secondary | ICD-10-CM | POA: Insufficient documentation

## 2019-07-08 DIAGNOSIS — Z21 Asymptomatic human immunodeficiency virus [HIV] infection status: Secondary | ICD-10-CM | POA: Insufficient documentation

## 2019-07-08 DIAGNOSIS — Z886 Allergy status to analgesic agent status: Secondary | ICD-10-CM | POA: Diagnosis not present

## 2019-07-08 DIAGNOSIS — J00 Acute nasopharyngitis [common cold]: Secondary | ICD-10-CM | POA: Diagnosis not present

## 2019-07-08 DIAGNOSIS — Z20822 Contact with and (suspected) exposure to covid-19: Secondary | ICD-10-CM | POA: Diagnosis not present

## 2019-07-08 DIAGNOSIS — J45909 Unspecified asthma, uncomplicated: Secondary | ICD-10-CM | POA: Diagnosis not present

## 2019-07-08 LAB — SARS CORONAVIRUS 2 (TAT 6-24 HRS): SARS Coronavirus 2: NEGATIVE

## 2019-07-08 NOTE — ED Provider Notes (Signed)
[place]    CSN: [id number] Arrival date & time: 07/08/19  1333      History   Chief Complaint Chief Complaint  Patient presents with  . Covid Testing    HPI [name] is a 34 y.o. male with nasal congestion and sneezing comes to urgent care for further evaluation.  Symptoms started 2 days ago.  He started experiencing generalized body aches and some nasal congestion and has a visit to the urgent care for evaluation.Marland Kitchen   HPI  Past Medical History:  Diagnosis Date  . Anxiety   . Asthma    no prior hospitalizations, intubations  . Depression   . Headache    "only w/stress" (11/01/2017)  . HIV (human immunodeficiency virus infection) ([place])   . Migraine    "a few/month" (11/01/2017)  . Refusal of blood transfusions as patient is Jehovah's Witness   . Seizure ([place])    "only w/my low blood sugars"  (11/01/2017)  . Type I diabetes mellitus [facility])     Patient Active Problem List   Diagnosis Date Noted  . Hypoglycemia 03/31/2019  . Syphilis 01/15/2019  . DKA (diabetic ketoacidoses) ([place]) 04/20/2018  . Closed fracture of base of fifth metatarsal bone of right foot 11/13/2017  . Closed displaced comminuted fracture of shaft of right tibia 11/01/2017  . Closed fracture of medial malleolus of right ankle 10/31/2017  . Vaccine counseling 12/14/2016  . Screening examination for venereal disease 06/09/2015  . Encounter for long-term (current) use of medications 06/09/2015  . Depression 03/04/2015  . Dizziness and giddiness 03/04/2015  . Tobacco dependence 11/12/2014  . Human immunodeficiency virus (HIV) disease ([place]) 11/11/2014  . Stress 12/19/2012  . Asthma 10/02/2010  . Diabetes mellitus type 1 ([place]) 03/01/1991    Past Surgical History:  Procedure Laterality Date  . FRACTURE SURGERY    . IM NAILING TIBIA Right 11/01/2017   INTRAMEDULLARY (IM) NAIL TIBIALRightGeneral  . ORIF ANKLE FRACTURE Right 11/01/2017  . ORIF ANKLE FRACTURE Right 11/01/2017   Procedure: OPEN REDUCTION INTERNAL FIXATION (ORIF) ANKLE FRACTURE;  Surgeon: Shona Needles, MD;  Location: [place];  Service: Orthopedics;  Laterality: Right;  . TIBIA IM NAIL INSERTION Right 11/01/2017   Procedure: INTRAMEDULLARY (IM) NAIL TIBIAL;  Surgeon: Shona Needles, MD;  Location: [place];  Service: Orthopedics;  Laterality: Right;       Home Medications    Prior to Admission medications   Medication Sig Start Date End Date Taking? Authorizing Provider  acetaminophen (TYLENOL) 500 MG tablet Take 1 tablet (500 mg total) by mouth every 6 (six) hours as needed. 02/10/15   Dorena Dew, FNP  Blood Glucose Monitoring Suppl (TRUE METRIX METER) w/Device KIT Use as instructed. Check blood glucose level by fingerstick three times per day. 03/08/19   Gildardo Pounds, NP  citalopram (CELEXA) 20 MG tablet TAKE 1 TABLET BY MOUTH DAILY Patient taking differently: Take 20 mg by mouth daily.  02/06/19   Thayer Headings, MD  elvitegravir-cobicistat-emtricitabine-tenofovir (GENVOYA) 150-150-200-10 MG TABS tablet Take 1 tablet by mouth daily with breakfast. 05/20/19   Comer, Okey Regal, MD  feeding supplement (BOOST HIGH PROTEIN) LIQD Take 1 Container by mouth 2 (two) times daily as needed (supplement).     [provider]  glucose blood (TRUE METRIX BLOOD GLUCOSE TEST) test strip Use as instructed 03/08/19   Gildardo Pounds, NP  ibuprofen (ADVIL,MOTRIN) 200 MG tablet Take 600 mg by mouth every 6 (six) hours as needed for  moderate pain.    [provider]  insulin aspart protamine- aspart (NOVOLOG MIX 70/30) (70-30) 100 UNIT/ML injection Inject 36-50 Units into the skin See admin instructions. 50 units in the morning, 36 units in the evening    [provider]  Insulin Pen Needle (B-D UF III MINI PEN NEEDLES) 31G X 5 MM MISC Use as instructed. Inject into the skin four times daily. ICD 10 E10.42 03/08/19   Gildardo Pounds, NP  Multiple Vitamins-Minerals (MULTIVITAMIN WITH MINERALS)  tablet Take 1 tablet by mouth daily.    [provider]  TRUEplus Lancets 28G MISC Use as instructed. Check blood glucose level by fingerstick three per day. 03/08/19   Gildardo Pounds, NP  insulin lispro (HUMALOG KWIKPEN) 100 UNIT/ML KwikPen Inject 0.1 mLs (10 Units total) into the skin 3 (three) times daily with meals. ICD 10 E10.42 Patient not taking: Reported on 03/31/2019 03/08/19 07/08/19  Gildardo Pounds, NP  pravastatin (PRAVACHOL) 20 MG tablet Take 1 tablet (20 mg total) by mouth daily. Patient not taking: Reported on 03/31/2019 03/08/19 07/08/19  Gildardo Pounds, NP    Family History Family History  Problem Relation Age of Onset  . Thyroid disease Mother   . Diabetes Mother   . Breast cancer Maternal Grandmother   . Diabetes Maternal Grandmother     Social History Social History   Tobacco Use  . Smoking status: Current Every Day Smoker    Types: Cigars    Start date: 09/29/2015  . Smokeless tobacco: Never Used  . Tobacco comment: 11/01/2017  "2 cigarettes in ~ 6 month "  Substance Use Topics  . Alcohol use: Yes    Alcohol/week: 7.0 standard drinks    Types: 7 Cans of beer per week  . Drug use: Yes    Frequency: 2.0 times per week    Types: Marijuana    Comment: 11/01/2017  "weekly"     Allergies   Shellfish allergy and Aspirin   Review of Systems Review of Systems  Constitutional: Negative for activity change, fatigue and unexpected weight change.  HENT: Positive for congestion and sore throat. Negative for postnasal drip and rhinorrhea.   Gastrointestinal: Negative.   Genitourinary: Negative for dysuria, frequency and urgency.  Musculoskeletal: Negative.      Physical Exam Triage Vital Signs ED Triage Vitals [07/08/19 1414]  Enc Vitals Group     BP (!) 152/90     Pulse Rate 74     Resp 17     Temp 98.5 F (36.9 C)     Temp Source Oral     SpO2 100 %     Weight      Height      Head Circumference      Peak Flow      Pain Score 2     Pain Loc       Pain Edu?      Excl. in [place]?    No data found.  Updated Vital Signs BP (!) 152/90 (BP Location: Right Arm)   Pulse 74   Temp 98.5 F (36.9 C) (Oral)   Resp 17   SpO2 100%   Visual Acuity Right Eye Distance:   Left Eye Distance:   Bilateral Distance:    Right Eye Near:   Left Eye Near:    Bilateral Near:     Physical Exam Vitals and nursing note reviewed.  Constitutional:      General: He is not in acute distress.  Appearance: He is normal weight. He is not ill-appearing or diaphoretic.  Cardiovascular:     Rate and Rhythm: Normal rate and regular rhythm.     Pulses: Normal pulses.     Heart sounds: Normal heart sounds.  Pulmonary:     Effort: Pulmonary effort is normal. No respiratory distress.     Breath sounds: Normal breath sounds. No stridor. No rhonchi.  Neurological:     Mental Status: He is alert.      UC Treatments / Results  Labs (all labs ordered are listed, but only abnormal results are displayed) Labs Reviewed  SARS CORONAVIRUS 2 (TAT 6-24 HRS)    EKG   Radiology No results found.  Procedures Procedures (including critical care time)  Medications Ordered in UC Medications - No data to display  Initial Impression / Assessment and Plan / UC Course  I have reviewed the triage vital signs and the nursing notes.  Pertinent labs & imaging results that were available during my care of the patient were reviewed by me and considered in my medical decision making (see chart for details).     1.  Acute nasopharyngitis: COVID-19 test sent Patient is advised to quarantine Return precautions given. Final Clinical Impressions(s) / UC Diagnoses   Final diagnoses:  Nasopharyngitis acute   Discharge Instructions   None    ED Prescriptions    None     PDMP not reviewed this encounter.   Chase Picket, MD 07/08/19 1455

## 2019-07-08 NOTE — ED Triage Notes (Signed)
Pt presents for covid testing per his employment after having generalized body aches and congestion X 2 days.

## 2019-07-15 ENCOUNTER — Other Ambulatory Visit: Payer: Self-pay

## 2019-07-31 ENCOUNTER — Encounter: Payer: No Typology Code available for payment source | Admitting: Internal Medicine

## 2019-08-14 ENCOUNTER — Encounter: Payer: Self-pay | Admitting: Internal Medicine

## 2019-08-16 MED FILL — ?BASAGLAR 100 UNITS/ML KWPE: 100 | 30 days supply | Qty: 12 | Fill #3

## 2019-09-23 MED FILL — ?BASAGLAR 100 UNITS/ML KWPE: 100 | 30 days supply | Qty: 12 | Fill #4

## 2019-09-23 MED FILL — ?HUMALOG 100 UNITS/ML KWIKP: 100 | 30 days supply | Qty: 9 | Fill #2

## 2019-10-20 ENCOUNTER — Emergency Department (HOSPITAL_COMMUNITY): Payer: No Typology Code available for payment source

## 2019-10-20 ENCOUNTER — Encounter (HOSPITAL_COMMUNITY): Payer: Self-pay | Admitting: Emergency Medicine

## 2019-10-20 ENCOUNTER — Emergency Department (HOSPITAL_COMMUNITY)
Admission: EM | Admit: 2019-10-20 | Discharge: 2019-10-20 | Disposition: A | Discharge status: Home / Self Care | Payer: No Typology Code available for payment source | Attending: Emergency Medicine | Admitting: Emergency Medicine

## 2019-10-20 DIAGNOSIS — E11649 Type 2 diabetes mellitus with hypoglycemia without coma: Secondary | ICD-10-CM | POA: Insufficient documentation

## 2019-10-20 DIAGNOSIS — J45909 Unspecified asthma, uncomplicated: Secondary | ICD-10-CM | POA: Insufficient documentation

## 2019-10-20 DIAGNOSIS — Z79899 Other long term (current) drug therapy: Secondary | ICD-10-CM | POA: Insufficient documentation

## 2019-10-20 DIAGNOSIS — E162 Hypoglycemia, unspecified: Secondary | ICD-10-CM

## 2019-10-20 DIAGNOSIS — F17200 Nicotine dependence, unspecified, uncomplicated: Secondary | ICD-10-CM | POA: Insufficient documentation

## 2019-10-20 DIAGNOSIS — Z21 Asymptomatic human immunodeficiency virus [HIV] infection status: Secondary | ICD-10-CM | POA: Insufficient documentation

## 2019-10-20 DIAGNOSIS — E1110 Type 2 diabetes mellitus with ketoacidosis without coma: Secondary | ICD-10-CM | POA: Insufficient documentation

## 2019-10-20 DIAGNOSIS — R1033 Periumbilical pain: Secondary | ICD-10-CM | POA: Insufficient documentation

## 2019-10-20 DIAGNOSIS — R112 Nausea with vomiting, unspecified: Secondary | ICD-10-CM | POA: Insufficient documentation

## 2019-10-20 DIAGNOSIS — Z794 Long term (current) use of insulin: Secondary | ICD-10-CM | POA: Insufficient documentation

## 2019-10-20 LAB — CBG MONITORING, ED
Glucose-Capillary: 131 mg/dL — ABNORMAL HIGH (ref 70–99)
Glucose-Capillary: 168 mg/dL — ABNORMAL HIGH (ref 70–99)
Glucose-Capillary: 192 mg/dL — ABNORMAL HIGH (ref 70–99)
Glucose-Capillary: 59 mg/dL — ABNORMAL LOW (ref 70–99)
Glucose-Capillary: 59 mg/dL — ABNORMAL LOW (ref 70–99)
Glucose-Capillary: 78 mg/dL (ref 70–99)

## 2019-10-20 LAB — BASIC METABOLIC PANEL
Anion gap: 9 (ref 5–15)
BUN: 16 mg/dL (ref 6–20)
CO2: 25 mmol/L (ref 22–32)
Calcium: 9.9 mg/dL (ref 8.9–10.3)
Chloride: 106 mmol/L (ref 98–111)
Creatinine, Ser: 1.12 mg/dL (ref 0.61–1.24)
GFR calc Af Amer: >60 mL/min (ref >60)
GFR calc non Af Amer: >60 mL/min (ref >60)
Glucose, Bld: 102 mg/dL — ABNORMAL HIGH (ref 70–99)
Potassium: 4.7 mmol/L (ref 3.5–5.1)
Sodium: 140 mmol/L (ref 135–145)

## 2019-10-20 LAB — CBC WITH DIFFERENTIAL/PLATELET
Abs Immature Granulocytes: 0.02 10*3/uL (ref 0.00–0.07)
Basophils Absolute: 0 10*3/uL (ref 0.0–0.1)
Basophils Relative: 0 %
Eosinophils Absolute: 0 10*3/uL (ref 0.0–0.5)
Eosinophils Relative: 0 %
HCT: 45.8 % (ref 39.0–52.0)
Hemoglobin: 16.1 g/dL (ref 13.0–17.0)
Immature Granulocytes: 0 %
Lymphocytes Relative: 13 %
Lymphs Abs: 1.1 10*3/uL (ref 0.7–4.0)
MCH: 32.2 pg (ref 26.0–34.0)
MCHC: 35.2 g/dL (ref 30.0–36.0)
MCV: 91.6 fL (ref 80.0–100.0)
Monocytes Absolute: 0.6 10*3/uL (ref 0.1–1.0)
Monocytes Relative: 7 %
Neutro Abs: 6.9 10*3/uL (ref 1.7–7.7)
Neutrophils Relative %: 80 %
Platelets: 265 10*3/uL (ref 150–400)
RBC: 5 MIL/uL (ref 4.22–5.81)
RDW: 13 % (ref 11.5–15.5)
WBC: 8.7 10*3/uL (ref 4.0–10.5)
nRBC: 0 % (ref 0.0–0.2)

## 2019-10-20 LAB — HEPATIC FUNCTION PANEL
ALT: 15 U/L (ref 0–44)
AST: 21 U/L (ref 15–41)
Albumin: 4 g/dL (ref 3.5–5.0)
Alkaline Phosphatase: 41 U/L (ref 38–126)
Bilirubin, Direct: 0.2 mg/dL (ref 0.0–0.2)
Indirect Bilirubin: 0.3 mg/dL (ref 0.3–0.9)
Total Bilirubin: 0.5 mg/dL (ref 0.3–1.2)
Total Protein: 7.1 g/dL (ref 6.5–8.1)

## 2019-10-20 LAB — LIPASE, BLOOD: Lipase: 21 U/L (ref 11–51)

## 2019-10-20 MED ORDER — IOHEXOL 300 MG/ML  SOLN
100.0000 mL | Freq: Once | INTRAMUSCULAR | Status: AC | PRN
  Administered 2019-10-20: 100 mL via INTRAVENOUS

## 2019-10-20 MED ORDER — ONDANSETRON HCL 4 MG/2ML IJ SOLN
4.0000 mg | Freq: Once | INTRAMUSCULAR | Status: AC
  Administered 2019-10-20: 4 mg via INTRAVENOUS

## 2019-10-20 MED ORDER — ONDANSETRON HCL 4 MG PO TABS: 4.0000 mg | ORAL_TABLET | Freq: Four times a day (QID) | ORAL | 0 refills | Status: DC

## 2019-10-20 NOTE — ED Notes (Signed)
MD aware of CBG. Was able to sit pt up in bed and assisted with eating a few bites of his [place] sandwich, one graham cracker and a half a cup of cranberry juice. Pt is alert to voice and ox4 however falls asleep as soon as I stop speaking to him.

## 2019-10-20 NOTE — ED Notes (Signed)
Pt now actively vomiting after drinking coke

## 2019-10-20 NOTE — ED Provider Notes (Signed)
[place] EMERGENCY DEPARTMENT Provider Note   CSN: [id number] Arrival date & time: 10/20/19  0022     History Chief Complaint  Patient presents with  . Hypoglycemia    [name] is a 34 y.o. male.  Patient brought to the emergency department from home by EMS.  Patient had not been seen since yesterday, was checked on by a friend and found unresponsive.  EMS identified hypoglycemia but were unable to establish an IV.  Patient transported to the emergency department for treatment.  He is unresponsive at arrival. Level V Caveat due to acuity.        Past Medical History:  Diagnosis Date  . Anxiety   . Asthma    no prior hospitalizations, intubations  . Depression   . Headache    "only w/stress" (11/01/2017)  . HIV (human immunodeficiency virus infection) ([place])   . Migraine    "a few/month" (11/01/2017)  . Refusal of blood transfusions as patient is Jehovah's Witness   . Seizure ([place])    "only w/my low blood sugars"  (11/01/2017)  . Type I diabetes mellitus [facility])     Patient Active Problem List   Diagnosis Date Noted  . Hypoglycemia 03/31/2019  . Syphilis 01/15/2019  . DKA (diabetic ketoacidoses) ([place]) 04/20/2018  . Closed fracture of base of fifth metatarsal bone of right foot 11/13/2017  . Closed displaced comminuted fracture of shaft of right tibia 11/01/2017  . Closed fracture of medial malleolus of right ankle 10/31/2017  . Vaccine counseling 12/14/2016  . Screening examination for venereal disease 06/09/2015  . Encounter for long-term (current) use of medications 06/09/2015  . Depression 03/04/2015  . Dizziness and giddiness 03/04/2015  . Tobacco dependence 11/12/2014  . Human immunodeficiency virus (HIV) disease ([place]) 11/11/2014  . Stress 12/19/2012  . Asthma 10/02/2010  . Diabetes mellitus type 1 ([place]) 03/01/1991    Past Surgical History:  Procedure Laterality Date  . FRACTURE SURGERY    . IM NAILING TIBIA Right 11/01/2017    INTRAMEDULLARY (IM) NAIL TIBIALRightGeneral  . ORIF ANKLE FRACTURE Right 11/01/2017  . ORIF ANKLE FRACTURE Right 11/01/2017   Procedure: OPEN REDUCTION INTERNAL FIXATION (ORIF) ANKLE FRACTURE;  Surgeon: Shona Needles, MD;  Location: [place];  Service: Orthopedics;  Laterality: Right;  . TIBIA IM NAIL INSERTION Right 11/01/2017   Procedure: INTRAMEDULLARY (IM) NAIL TIBIAL;  Surgeon: Shona Needles, MD;  Location: [place];  Service: Orthopedics;  Laterality: Right;       Family History  Problem Relation Age of Onset  . Thyroid disease Mother   . Diabetes Mother   . Breast cancer Maternal Grandmother   . Diabetes Maternal Grandmother     Social History   Tobacco Use  . Smoking status: Current Every Day Smoker    Types: Cigars    Start date: 09/29/2015  . Smokeless tobacco: Never Used  . Tobacco comment: 11/01/2017  "2 cigarettes in ~ 6 month "  Vaping Use  . Vaping Use: Never used  Substance Use Topics  . Alcohol use: Yes    Alcohol/week: 7.0 standard drinks    Types: 7 Cans of beer per week  . Drug use: Yes    Frequency: 2.0 times per week    Types: Marijuana    Comment: 11/01/2017  "weekly"    Home Medications Prior to Admission medications   Medication Sig Start Date End Date Taking? Authorizing Provider  acetaminophen (TYLENOL) 500 MG tablet Take 1 tablet (500 mg  total) by mouth every 6 (six) hours as needed. 02/10/15   Dorena Dew, FNP  Blood Glucose Monitoring Suppl (TRUE METRIX METER) w/Device KIT Use as instructed. Check blood glucose level by fingerstick three times per day. 03/08/19   Gildardo Pounds, NP  citalopram (CELEXA) 20 MG tablet TAKE 1 TABLET BY MOUTH DAILY Patient taking differently: Take 20 mg by mouth daily.  02/06/19   Thayer Headings, MD  elvitegravir-cobicistat-emtricitabine-tenofovir (GENVOYA) 150-150-200-10 MG TABS tablet Take 1 tablet by mouth daily with breakfast. 05/20/19   Comer, Okey Regal, MD  feeding supplement (BOOST HIGH PROTEIN) LIQD Take 1  Container by mouth 2 (two) times daily as needed (supplement).     [provider]  glucose blood (TRUE METRIX BLOOD GLUCOSE TEST) test strip Use as instructed 03/08/19   Gildardo Pounds, NP  ibuprofen (ADVIL,MOTRIN) 200 MG tablet Take 600 mg by mouth every 6 (six) hours as needed for moderate pain.    [provider]  insulin aspart protamine- aspart (NOVOLOG MIX 70/30) (70-30) 100 UNIT/ML injection Inject 36-50 Units into the skin See admin instructions. 50 units in the morning, 36 units in the evening    [provider]  Insulin Pen Needle (B-D UF III MINI PEN NEEDLES) 31G X 5 MM MISC Use as instructed. Inject into the skin four times daily. ICD 10 E10.42 03/08/19   Gildardo Pounds, NP  Multiple Vitamins-Minerals (MULTIVITAMIN WITH MINERALS) tablet Take 1 tablet by mouth daily.    [provider]  TRUEplus Lancets 28G MISC Use as instructed. Check blood glucose level by fingerstick three per day. 03/08/19   Gildardo Pounds, NP  insulin lispro (HUMALOG KWIKPEN) 100 UNIT/ML KwikPen Inject 0.1 mLs (10 Units total) into the skin 3 (three) times daily with meals. ICD 10 E10.42 Patient not taking: Reported on 03/31/2019 03/08/19 07/08/19  Gildardo Pounds, NP  pravastatin (PRAVACHOL) 20 MG tablet Take 1 tablet (20 mg total) by mouth daily. Patient not taking: Reported on 03/31/2019 03/08/19 07/08/19  Gildardo Pounds, NP    Allergies    Shellfish allergy and Aspirin  Review of Systems   Review of Systems  Unable to perform ROS: Acuity of condition    Physical Exam Updated Vital Signs BP (!) 116/93 (BP Location: Right Arm)   Pulse 63   Temp (!) 95.3 F (35.2 C) (Rectal)   Resp 14   SpO2 100%   Physical Exam HENT:     Head: Atraumatic.  Eyes:     Pupils: Pupils are equal, round, and reactive to light.  Cardiovascular:     Rate and Rhythm: Normal rate and regular rhythm.  Pulmonary:     Effort: Pulmonary effort is normal.     Breath sounds: Normal breath  sounds.  Abdominal:     Palpations: Abdomen is soft.     Tenderness: There is abdominal tenderness in the periumbilical area.     Hernia: A hernia is present. Hernia is present in the umbilical area.  Musculoskeletal:        General: No swelling. Normal range of motion.     Cervical back: Neck supple.  Skin:    Comments: Cool  Neurological:     Mental Status: He is lethargic.     GCS: GCS eye subscore is 1. GCS verbal subscore is 1. GCS motor subscore is 4.     ED Results / Procedures / Treatments   Labs (all labs ordered are listed, but only abnormal results  are displayed) Labs Reviewed  BASIC METABOLIC PANEL - Abnormal; Notable for the following components:      Result Value   Glucose, Bld 102 (*)    All other components within normal limits  CBG MONITORING, ED - Abnormal; Notable for the following components:   Glucose-Capillary 192 (*)    All other components within normal limits  CBG MONITORING, ED - Abnormal; Notable for the following components:   Glucose-Capillary 131 (*)    All other components within normal limits  CBC WITH DIFFERENTIAL/PLATELET  HEPATIC FUNCTION PANEL  LIPASE, BLOOD    EKG None  Radiology CT ABDOMEN PELVIS W CONTRAST  Result Date: 10/20/2019 CLINICAL DATA:  Nausea and vomiting EXAM: CT ABDOMEN AND PELVIS WITH CONTRAST TECHNIQUE: Multidetector CT imaging of the abdomen and pelvis was performed using the standard protocol following bolus administration of intravenous contrast. CONTRAST:  183m OMNIPAQUE IOHEXOL 300 MG/ML  SOLN COMPARISON:  None. FINDINGS: LOWER CHEST: Normal. HEPATOBILIARY: Normal hepatic contours. No intra- or extrahepatic biliary dilatation. The gallbladder is normal. PANCREAS: Normal pancreas. No ductal dilatation or peripancreatic fluid collection. SPLEEN: Normal. ADRENALS/URINARY TRACT: The adrenal glands are normal. No hydronephrosis, nephroureterolithiasis or solid renal mass. The urinary bladder is normal for degree of  distention STOMACH/BOWEL: There is no hiatal hernia. Normal duodenal course and caliber. No small bowel dilatation or inflammation. No focal colonic abnormality. Normal appendix. VASCULAR/LYMPHATIC: Normal course and caliber of the major abdominal vessels. No abdominal or pelvic lymphadenopathy. REPRODUCTIVE: Normal prostate size with symmetric seminal vesicles. MUSCULOSKELETAL. No bony spinal canal stenosis or focal osseous abnormality. OTHER: None. IMPRESSION: No acute abnormality of the abdomen or pelvis. Electronically Signed   By: KUlyses JarredM.D.   On: 10/20/2019 03:35    Procedures Procedures (including critical care time)  Medications Ordered in ED Medications  ondansetron (ZOFRAN) injection 4 mg (4 mg Intravenous Given 10/20/19 0037)  iohexol (OMNIPAQUE) 300 MG/ML solution 100 mL (100 mLs Intravenous Contrast Given 10/20/19 0258)    ED Course  I have reviewed the triage vital signs and the nursing notes.  Pertinent labs & imaging results that were available during my care of the patient were reviewed by me and considered in my medical decision making (see chart for details).    MDM Rules/Calculators/A&P                          Patient arrived in the emergency department unresponsive.  Patient is a known diabetic, had a blood sugar of 20 identified by EMS.  They were unable to establish an IV and did not have glucagon available.  An IV was established immediately at arrival and he was given a bolus of D10 which (EMS already had available).  Patient became briefly agitated then more awake and alert.  He did have a small amount of emesis.  Patient has become more awake and alert.  He has been ambulatory to the bathroom.  He is complaining of mid abdominal pain.  He has not been able to hold anything down here in the ER, experiencing vomiting after taking oral fluids.  Exam does reveal diffuse periumbilical tenderness with a small umbilical hernia.  Patient had additional lab work  performed.  All is within normal limits.  He had a nonfocal abdominal exam but because of the repeated vomiting and pain, CT scan was performed CT scan does not show any acute abnormality.  Patient has been monitored for period of time and has done well.  Vital signs remained normal (he did have mild hypothermia at arrival secondary to hypoglycemia).  Sugars have remained stable.  At this point patient does not appear to be in any further distress and work-up has been reassuring, will be appropriate for discharge.   Final Clinical Impression(s) / ED Diagnoses Final diagnoses:  Hypoglycemia  Periumbilical abdominal pain  Non-intractable vomiting with nausea, unspecified vomiting type    Rx / DC Orders ED Discharge Orders    None       Orpah Greek, MD 10/20/19 [phone]

## 2019-10-20 NOTE — ED Notes (Signed)
Verbal order for 1/2 of amp of D50 given IV due to pt refusing to eat or drink anything because still feels nauseous

## 2019-10-20 NOTE — ED Triage Notes (Signed)
Pt arrives to bridge with no IV access ams found in bed by friend LSN some time yesterday unclear what time. IV was placed at bridge and ems gave 100CCbag of D10. CBG rechecked upon entering trauma A 190. Pt has rectal temp of 95.3 placed warm blankets pt began to vomit upon waking. Pt given 4mg  zofran IV. Pt is now more responsive but still lethargic.

## 2019-10-20 NOTE — ED Notes (Signed)
Pt got out of bed and ambulated to RR without informing this RN.

## 2019-10-21 MED FILL — ONDANSETRON HCL 4 MG TABLET: 4 | 3 days supply | Qty: 12 | Fill #0

## 2019-10-29 ENCOUNTER — Telehealth: Payer: Self-pay

## 2019-10-29 ENCOUNTER — Other Ambulatory Visit: Payer: Self-pay | Admitting: Internal Medicine

## 2019-10-29 DIAGNOSIS — B20 Human immunodeficiency virus [HIV] disease: Secondary | ICD-10-CM

## 2019-10-29 NOTE — Telephone Encounter (Signed)
Called to get appointment scheduled for labs and Dr. Luciana Axe, left a voicemail to call us back and get that scheduled

## 2019-10-31 MED FILL — ?BASAGLAR 100 UNITS/ML KWPE: 100 | 30 days supply | Qty: 12 | Fill #5

## 2019-11-06 ENCOUNTER — Other Ambulatory Visit: Payer: Self-pay

## 2019-11-06 ENCOUNTER — Emergency Department (HOSPITAL_COMMUNITY)
Admission: EM | Admit: 2019-11-06 | Discharge: 2019-11-06 | Disposition: A | Discharge status: Home / Self Care | Payer: No Typology Code available for payment source | Attending: Emergency Medicine | Admitting: Emergency Medicine

## 2019-11-06 ENCOUNTER — Encounter (HOSPITAL_COMMUNITY): Payer: Self-pay

## 2019-11-06 DIAGNOSIS — Z5321 Procedure and treatment not carried out due to patient leaving prior to being seen by health care provider: Secondary | ICD-10-CM | POA: Insufficient documentation

## 2019-11-06 DIAGNOSIS — E162 Hypoglycemia, unspecified: Secondary | ICD-10-CM | POA: Insufficient documentation

## 2019-11-06 LAB — CBC
HCT: 47.1 % (ref 39.0–52.0)
Hemoglobin: 16.3 g/dL (ref 13.0–17.0)
MCH: 31.7 pg (ref 26.0–34.0)
MCHC: 34.6 g/dL (ref 30.0–36.0)
MCV: 91.6 fL (ref 80.0–100.0)
Platelets: 248 10*3/uL (ref 150–400)
RBC: 5.14 MIL/uL (ref 4.22–5.81)
RDW: 13.2 % (ref 11.5–15.5)
WBC: 8.3 10*3/uL (ref 4.0–10.5)
nRBC: 0 % (ref 0.0–0.2)

## 2019-11-06 LAB — COMPREHENSIVE METABOLIC PANEL
ALT: 15 U/L (ref 0–44)
AST: 17 U/L (ref 15–41)
Albumin: 4.3 g/dL (ref 3.5–5.0)
Alkaline Phosphatase: 48 U/L (ref 38–126)
Anion gap: 10 (ref 5–15)
BUN: 12 mg/dL (ref 6–20)
CO2: 23 mmol/L (ref 22–32)
Calcium: 9.4 mg/dL (ref 8.9–10.3)
Chloride: 104 mmol/L (ref 98–111)
Creatinine, Ser: 1.01 mg/dL (ref 0.61–1.24)
GFR calc Af Amer: >60 mL/min (ref >60)
GFR calc non Af Amer: >60 mL/min (ref >60)
Glucose, Bld: 118 mg/dL — ABNORMAL HIGH (ref 70–99)
Potassium: 4 mmol/L (ref 3.5–5.1)
Sodium: 137 mmol/L (ref 135–145)
Total Bilirubin: 0.7 mg/dL (ref 0.3–1.2)
Total Protein: 7.6 g/dL (ref 6.5–8.1)

## 2019-11-06 LAB — CBG MONITORING, ED: Glucose-Capillary: 120 mg/dL — ABNORMAL HIGH (ref 70–99)

## 2019-11-06 NOTE — ED Triage Notes (Signed)
Patient arrived via [facility] from work.   Called out for hypoglycemia.  CBG-43 on arrival EMS gave candy bars, 3 sodas, bojangles combo meal, and chips (2,000 calories worth?)  New CBG-130   Hx: of eating as much as he can and still being hypo and hospilation required to maintain sugar.

## 2019-11-19 ENCOUNTER — Other Ambulatory Visit: Payer: Self-pay | Admitting: Internal Medicine

## 2019-11-19 DIAGNOSIS — B20 Human immunodeficiency virus [HIV] disease: Secondary | ICD-10-CM

## 2019-11-22 ENCOUNTER — Other Ambulatory Visit: Payer: Self-pay

## 2019-11-22 ENCOUNTER — Telehealth: Payer: Self-pay | Admitting: *Deleted

## 2019-11-22 ENCOUNTER — Ambulatory Visit: Payer: Self-pay

## 2019-11-22 ENCOUNTER — Encounter: Payer: Self-pay | Admitting: Internal Medicine

## 2019-11-22 ENCOUNTER — Other Ambulatory Visit: Payer: Self-pay | Admitting: *Deleted

## 2019-11-22 DIAGNOSIS — B20 Human immunodeficiency virus [HIV] disease: Secondary | ICD-10-CM

## 2019-11-22 DIAGNOSIS — Z113 Encounter for screening for infections with a predominantly sexual mode of transmission: Secondary | ICD-10-CM

## 2019-11-22 DIAGNOSIS — Z79899 Other long term (current) drug therapy: Secondary | ICD-10-CM

## 2019-11-22 LAB — T-HELPER CELL (CD4) - (RCID CLINIC ONLY)
CD4 % Helper T Cell: 32 % — ABNORMAL LOW (ref 33–65)
CD4 T Cell Abs: 641 /uL (ref 400–1790)

## 2019-11-22 MED ORDER — GENVOYA 150-150-200-10 MG PO TABS: 1.0000 | ORAL_TABLET | Freq: Every day | ORAL | 0 refills | Status: DC

## 2019-11-22 NOTE — Telephone Encounter (Signed)
Sent in refill of Genvoya per Dr. Earlene Plater. Andree Coss, RN

## 2019-11-22 NOTE — Addendum Note (Signed)
Addended by: Andree Coss on: 11/22/2019 02:52 PM   Modules accepted: Orders

## 2019-11-22 NOTE — Telephone Encounter (Signed)
Received refill request for patient's genvoya. RN attempted to call him this week, unable to reach him. Patient walked into clinic 11/22/19 to renew ADAP, currently has coverage until 11/28/19. Patient has been off Genvoya "for some time." RN placed orders for labs, patient will follow up with Dr Earlene Plater in 2 weeks. Will need advice if it is ok to restart on Genvoya or if he should start another medication/have something added to his regimen. Patient does not have a working phone at this time for clinic to reach out to him.  He will contact clinic or receive update at his next appointment. Andree Coss, RN

## 2019-11-25 LAB — CBC WITH DIFFERENTIAL/PLATELET
Absolute Monocytes: 590 cells/uL (ref 200–950)
Basophils Absolute: 20 cells/uL (ref 0–200)
Basophils Relative: 0.3 %
Eosinophils Absolute: 101 cells/uL (ref 15–500)
Eosinophils Relative: 1.5 %
HCT: 46.9 % (ref 38.5–50.0)
Hemoglobin: 15.8 g/dL (ref 13.2–17.1)
Lymphs Abs: 2191 cells/uL (ref 850–3900)
MCH: 31.3 pg (ref 27.0–33.0)
MCHC: 33.7 g/dL (ref 32.0–36.0)
MCV: 92.9 fL (ref 80.0–100.0)
MPV: 9.4 fL (ref 7.5–12.5)
Monocytes Relative: 8.8 %
Neutro Abs: 3799 cells/uL (ref 1500–7800)
Neutrophils Relative %: 56.7 %
Platelets: 282 10*3/uL (ref 140–400)
RBC: 5.05 10*6/uL (ref 4.20–5.80)
RDW: 13.7 % (ref 11.0–15.0)
Total Lymphocyte: 32.7 %
WBC: 6.7 10*3/uL (ref 3.8–10.8)

## 2019-11-25 LAB — COMPLETE METABOLIC PANEL WITHOUT GFR
AG Ratio: 1.6 (calc) (ref 1.0–2.5)
ALT: 10 U/L (ref 9–46)
AST: 13 U/L (ref 10–40)
Albumin: 4.2 g/dL (ref 3.6–5.1)
Alkaline phosphatase (APISO): 47 U/L (ref 36–130)
BUN: 12 mg/dL (ref 7–25)
CO2: 27 mmol/L (ref 20–32)
Calcium: 9.9 mg/dL (ref 8.6–10.3)
Chloride: 103 mmol/L (ref 98–110)
Creat: 1.11 mg/dL (ref 0.60–1.35)
GFR, Est African American: 101 mL/min/1.73m2 (ref >=60)
GFR, Est Non African American: 87 mL/min/1.73m2 (ref >=60)
Globulin: 2.7 g/dL (ref 1.9–3.7)
Glucose, Bld: 75 mg/dL (ref 65–99)
Potassium: 4.3 mmol/L (ref 3.5–5.3)
Sodium: 138 mmol/L (ref 135–146)
Total Bilirubin: 0.8 mg/dL (ref 0.2–1.2)
Total Protein: 6.9 g/dL (ref 6.1–8.1)

## 2019-11-25 LAB — HIV-1 RNA QUANT-NO REFLEX-BLD
HIV 1 RNA Quant: <20 {copies}/mL
HIV-1 RNA Quant, Log: <1.3 {Log_copies}/mL

## 2019-11-25 LAB — LIPID PANEL
Cholesterol: 163 mg/dL (ref <200)
HDL: 85 mg/dL (ref >=40)
LDL Cholesterol (Calc): 64 mg/dL (calc)
Non-HDL Cholesterol (Calc): 78 mg/dL (calc) (ref <130)
Total CHOL/HDL Ratio: 1.9 (calc) (ref <5.0)
Triglycerides: 49 mg/dL (ref <150)

## 2019-11-25 LAB — FLUORESCENT TREPONEMAL AB(FTA)-IGG-BLD: Fluorescent Treponemal ABS: REACTIVE — AB

## 2019-11-25 LAB — SYPHILIS: RPR W/REFLEX TO RPR TITER AND TREPONEMAL ANTIBODIES, TRADITIONAL SCREENING AND DIAGNOSIS ALGORITHM: RPR Ser Ql: REACTIVE — AB

## 2019-11-25 LAB — RPR TITER: RPR Titer: 1:8 {titer} — ABNORMAL HIGH

## 2019-12-05 DIAGNOSIS — Z0000 Encounter for general adult medical examination without abnormal findings: Secondary | ICD-10-CM | POA: Insufficient documentation

## 2019-12-05 NOTE — Assessment & Plan Note (Signed)
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  Now titer has come down 4-fold to 1:8 in Sept 2021.  Plan: -- Routine RPR monitoring but does not need further treatment right now

## 2019-12-05 NOTE — Progress Notes (Signed)
[facility] for Infectious Disease  Chief Complaint: HIV follow up.    Subjective: [name] is a 34 y.o. male with PMHx as below who presents to the clinic for HIV follow up. Please see A&P for the details of today's visit and status of the patient's medical problems.   Past Medical History:  Diagnosis Date  . Anxiety   . Asthma    no prior hospitalizations, intubations  . Depression   . Headache    "only w/stress" (11/01/2017)  . HIV (human immunodeficiency virus infection) ([place])   . Migraine    "a few/month" (11/01/2017)  . Refusal of blood transfusions as patient is Jehovah's Witness   . Seizure ([place])    "only w/my low blood sugars"  (11/01/2017)  . Type I diabetes mellitus ([place])     Outpatient Encounter Medications as of 12/06/2019  Medication Sig  . acetaminophen (TYLENOL) 500 MG tablet Take 1 tablet (500 mg total) by mouth every 6 (six) hours as needed.  . Blood Glucose Monitoring Suppl (TRUE METRIX METER) w/Device KIT Use as instructed. Check blood glucose level by fingerstick three times per day.  . citalopram (CELEXA) 20 MG tablet TAKE 1 TABLET BY MOUTH DAILY (Patient taking differently: Take 20 mg by mouth daily. )  . elvitegravir-cobicistat-emtricitabine-tenofovir (GENVOYA) 150-150-200-10 MG TABS tablet Take 1 tablet by mouth daily with breakfast.  . feeding supplement (BOOST HIGH PROTEIN) LIQD Take 1 Container by mouth 2 (two) times daily as needed (supplement).   Marland Kitchen glucose blood (TRUE METRIX BLOOD GLUCOSE TEST) test strip Use as instructed  . ibuprofen (ADVIL,MOTRIN) 200 MG tablet Take 600 mg by mouth every 6 (six) hours as needed for moderate pain.  Marland Kitchen insulin aspart protamine- aspart (NOVOLOG MIX 70/30) (70-30) 100 UNIT/ML injection Inject 36-50 Units into the skin See admin instructions. 50 units in the morning, 36 units in the evening  . Insulin Pen Needle (B-D UF III MINI PEN NEEDLES) 31G X 5 MM MISC Use as instructed. Inject into the skin  four times daily. ICD 10 E10.42  . Multiple Vitamins-Minerals (MULTIVITAMIN WITH MINERALS) tablet Take 1 tablet by mouth daily.  . ondansetron (ZOFRAN) 4 MG tablet Take 1 tablet (4 mg total) by mouth every 6 (six) hours.  . TRUEplus Lancets 28G MISC Use as instructed. Check blood glucose level by fingerstick three per day.  . [DISCONTINUED] insulin lispro (HUMALOG KWIKPEN) 100 UNIT/ML KwikPen Inject 0.1 mLs (10 Units total) into the skin 3 (three) times daily with meals. ICD 10 E10.42 (Patient not taking: Reported on 03/31/2019)  . [DISCONTINUED] pravastatin (PRAVACHOL) 20 MG tablet Take 1 tablet (20 mg total) by mouth daily. (Patient not taking: Reported on 03/31/2019)   No facility-administered encounter medications on file as of 12/06/2019.    Family History  Problem Relation Age of Onset  . Thyroid disease Mother   . Diabetes Mother   . Breast cancer Maternal Grandmother   . Diabetes Maternal Grandmother     Social History   Socioeconomic History  . Marital status: Single    Spouse name: Not on file  . Number of children: Not on file  . Years of education: Not on file  . Highest education level: Not on file  Occupational History  . Not on file  Tobacco Use  . Smoking status: Current Every Day Smoker    Types: Cigars    Start date: 09/29/2015  . Smokeless tobacco: Never Used  . Tobacco comment: 11/01/2017  "  2 cigarettes in ~ 6 month "  Vaping Use  . Vaping Use: Never used  Substance and Sexual Activity  . Alcohol use: Yes    Alcohol/week: 7.0 standard drinks    Types: 7 Cans of beer per week  . Drug use: Not Currently    Frequency: 2.0 times per week    Types: Marijuana    Comment: 11/01/2017  "weekly"  . Sexual activity: Yes    Partners: Male    Comment: pt. declined condoms  Other Topics Concern  . Not on file  Social History Narrative  . Not on file   Social Determinants of Health   Financial Resource Strain:   . Difficulty of Paying Living Expenses: Not on file    Food Insecurity:   . Worried About Charity fundraiser in the Last Year: Not on file  . Ran Out of Food in the Last Year: Not on file  Transportation Needs:   . Lack of Transportation (Medical): Not on file  . Lack of Transportation (Non-Medical): Not on file  Physical Activity:   . Days of Exercise per Week: Not on file  . Minutes of Exercise per Session: Not on file  Stress:   . Feeling of Stress : Not on file  Social Connections:   . Frequency of Communication with Friends and Family: Not on file  . Frequency of Social Gatherings with Friends and Family: Not on file  . Attends Religious Services: Not on file  . Active Member of Clubs or Organizations: Not on file  . Attends Archivist Meetings: Not on file  . Marital Status: Not on file  Intimate Partner Violence:   . Fear of Current or Ex-Partner: Not on file  . Emotionally Abused: Not on file  . Physically Abused: Not on file  . Sexually Abused: Not on file     Review of Systems: Review of Systems  Constitutional: Negative for chills and fever.  Respiratory: Negative.   Cardiovascular: Negative.   Genitourinary: Negative.   Psychiatric/Behavioral: Positive for depression. Negative for suicidal ideas.     Objective: Vitals:   12/06/19 1016  BP: (!) 159/98  Pulse: 84  Temp: 98.4 F (36.9 C)  TempSrc: Oral  Weight: 199 lb (90.3 kg)     Body mass index is 26.99 kg/m.  Physical Exam Constitutional:      Comments: NAD, withdrawn, does not make eye contact.   HENT:     Head: Normocephalic and atraumatic.  Cardiovascular:     Rate and Rhythm: Normal rate and regular rhythm.  Pulmonary:     Effort: Pulmonary effort is normal.     Breath sounds: Normal breath sounds.  Neurological:     General: No focal deficit present.     Mental Status: He is oriented to person, place, and time.  Psychiatric:     Comments: Withdrawn, flat affect.      Pertinent Labs and Microbiology:  Lab Results   Component Value Date   WBC 6.7 11/22/2019   HGB 15.8 11/22/2019   HCT 46.9 11/22/2019   MCV 92.9 11/22/2019   PLT 282 11/22/2019    Lab Results  Component Value Date   CREATININE 1.11 11/22/2019   BUN 12 11/22/2019   NA 138 11/22/2019   K 4.3 11/22/2019   CL 103 11/22/2019   CO2 27 11/22/2019    Lab Results  Component Value Date   ALT 10 11/22/2019   AST 13 11/22/2019   ALKPHOS 48  11/06/2019   BILITOT 0.8 11/22/2019    Lab Results  Component Value Date   CHOL 163 11/22/2019   HDL 85 11/22/2019   LDLCALC 64 11/22/2019   TRIG 49 11/22/2019   CHOLHDL 1.9 11/22/2019   HIV 1 RNA Quant  Date Value  11/22/2019 <20 Copies/mL  01/01/2019 <20 NOT DETECTED copies/mL  04/17/2018 <20 NOT DETECTED copies/mL   CD4 T Cell Abs (/uL)  Date Value  11/22/2019 641  01/01/2019 561  04/17/2018 710   Lab Results  Component Value Date   HIV1GENOSEQ REPORT 10/21/2014   Lab Results  Component Value Date   HAV NON REACTIVE 10/21/2014   Lab Results  Component Value Date   HEPBSAG NEGATIVE 10/21/2014   HEPBSAB POS (A) 10/21/2014   Lab Results  Component Value Date   HCVAB NEGATIVE 10/21/2014   Lab Results  Component Value Date   CHLAMYDIAWP Negative 04/17/2018   CHLAMYDIAWP Negative 04/17/2018   CHLAMYDIAWP Negative 04/17/2018   N Negative 04/17/2018   N Negative 04/17/2018   N Negative 04/17/2018   No results found for: GCPROBEAPT Lab Results  Component Value Date   QUANTGOLD NEGATIVE 10/21/2014     No results found for this or any previous visit (from the past 240 hour(s)).   Assessment and Plan: Diabetes mellitus type 1 ([place]) He is managing on his own with 70/30.  Most recent A1c in Feb 2021 was greater than 9.  Plan: -- needs better glycemic control -- will work on getting him plugged in with PCP and check A1c today.   Syphilis He is s/p Bicillin x 1 in Nov 2020 after RPR titer of 1:32.  Now titer has come down 4-fold to 1:8 in Sept 2021.  Plan: --  Routine RPR monitoring but does not need further treatment right now   Human immunodeficiency virus (HIV) disease ([place])  Tobias Danirus Seldon is a 34 y.o. male with HIV infection who is currently on [place].  He presented to clinic on 9/24 needing refills and stated he was out of his meds for "some time" however VL <20 and CD4 count was 641 that day.  His Genvoya was refilled.  He confirmed today that he was not actually out of meds that day and has been adherent.   Plan: -- Continue Genvoya -- RTC 3 months     Healthcare maintenance Immunizations: Influenza: needs but declined COVID-19: needs but declined today Tdap: 06/2012 PCV-13: 03/2018 PPSV-23: 09/2010, 12/2018 Hepatitis A: non-immune by labs 2016 Hepatitis B: immune by labs 2016 Meningitis: 03/2018, 12/2018 (booster due 2025) Varicella: can check IgG Shingles: not indicated MMR: can check immunity  Screening: Quantiferon: negative 2016 Hepatitis C: HCV Ab negative 2016 DEXA: not indicated HLA [id number]: negative        Depression He has ongoing issues with depression.  Seems to fluctuate based on situation. He has been adherent with celexa $RemoveBef'20mg'KskomCWsRG$  daily and currently does not want to make a medication change.  Plan: -- offered counseling today which he declines for now -- discussed making a medication change in the future -- RTC 3 months.    Vaccine counseling Discussed COVID and flu vaccine.  Recommended both to patient.  He declines flu and COVID today but states he will think about COVID vaccine.  I again recommended he receive.   Orders Placed This Encounter  Procedures  . HgB A1c  . Hepatitis A Ab, Total      [facility] 12/06/2019, 10:56 AM

## 2019-12-05 NOTE — Assessment & Plan Note (Addendum)
Immunizations: Influenza: needs but declined COVID-19: needs but declined today Tdap: 06/2012 PCV-13: 03/2018 PPSV-23: 09/2010, 12/2018 Hepatitis A: non-immune by labs 2016 Hepatitis B: immune by labs 2016 Meningitis: 03/2018, 12/2018 (booster due 2025) Varicella: can check IgG Shingles: not indicated MMR: can check immunity  Screening: Quantiferon: negative 2016 Hepatitis C: HCV Ab negative 2016 DEXA: not indicated HLA [id number]: negative

## 2019-12-05 NOTE — Assessment & Plan Note (Addendum)
  [name] is a 34 y.o. male with HIV infection who is currently on Genvoya.  He presented to clinic on 9/24 needing refills and stated he was out of his meds for "some time" however VL <20 and CD4 count was 641 that day.  His Genvoya was refilled.  He confirmed today that he was not actually out of meds that day and has been adherent.   Plan: -- Continue Genvoya -- RTC 3 months

## 2019-12-05 NOTE — Assessment & Plan Note (Addendum)
He is managing on his own with 70/30.  Most recent A1c in Feb 2021 was greater than 9.  Plan: -- needs better glycemic control -- will work on getting him plugged in with PCP and check A1c today.

## 2019-12-06 ENCOUNTER — Other Ambulatory Visit: Payer: Self-pay

## 2019-12-06 ENCOUNTER — Ambulatory Visit (INDEPENDENT_AMBULATORY_CARE_PROVIDER_SITE_OTHER): Payer: Self-pay | Admitting: Internal Medicine

## 2019-12-06 ENCOUNTER — Encounter: Payer: Self-pay | Admitting: Internal Medicine

## 2019-12-06 VITALS — BP 159/98 | HR 84 | Temp 98.4°F | Wt 199.0 lb

## 2019-12-06 DIAGNOSIS — Z7185 Encounter for immunization safety counseling: Secondary | ICD-10-CM

## 2019-12-06 DIAGNOSIS — B20 Human immunodeficiency virus [HIV] disease: Secondary | ICD-10-CM

## 2019-12-06 DIAGNOSIS — A539 Syphilis, unspecified: Secondary | ICD-10-CM

## 2019-12-06 DIAGNOSIS — Z0000 Encounter for general adult medical examination without abnormal findings: Secondary | ICD-10-CM

## 2019-12-06 DIAGNOSIS — E1021 Type 1 diabetes mellitus with diabetic nephropathy: Secondary | ICD-10-CM

## 2019-12-06 DIAGNOSIS — F32A Depression, unspecified: Secondary | ICD-10-CM

## 2019-12-06 NOTE — Assessment & Plan Note (Signed)
He has ongoing issues with depression.  Seems to fluctuate based on situation. He has been adherent with celexa 20mg  daily and currently does not want to make a medication change.  Plan: -- offered counseling today which he declines for now -- discussed making a medication change in the future -- RTC 3 months.

## 2019-12-06 NOTE — Patient Instructions (Signed)
Thank you for coming to see me today. It was a pleasure.   Today we talked about:  -- please get labs done and I will let you know about the results -- consider counseling or we can also consider making a change to your medications  Please follow-up with me in 3 months.   If you have any questions or concerns, please do not hesitate to call the office at [phone].  Take Care,   Gwynn Burly, DO

## 2019-12-06 NOTE — Assessment & Plan Note (Signed)
Discussed COVID and flu vaccine.  Recommended both to patient.  He declines flu and COVID today but states he will think about COVID vaccine.  I again recommended he receive.

## 2019-12-09 LAB — HEMOGLOBIN A1C
Hgb A1c MFr Bld: 7.8 % of total Hgb — ABNORMAL HIGH (ref <5.7)
Mean Plasma Glucose: 177 (calc)
eAG (mmol/L): 9.8 (calc)

## 2019-12-09 LAB — HEPATITIS A ANTIBODY, TOTAL: Hepatitis A AB,Total: NONREACTIVE

## 2019-12-18 MED FILL — ?HUMALOG 100 UNITS/ML KWIKP: 100 | 30 days supply | Qty: 9 | Fill #3

## 2019-12-18 MED FILL — ?BASAGLAR 100 UNITS/ML KWPE: 100 | 30 days supply | Qty: 12 | Fill #6

## 2019-12-19 ENCOUNTER — Other Ambulatory Visit: Payer: Self-pay | Admitting: Internal Medicine

## 2019-12-19 DIAGNOSIS — F32A Depression, unspecified: Secondary | ICD-10-CM

## 2019-12-19 DIAGNOSIS — B20 Human immunodeficiency virus [HIV] disease: Secondary | ICD-10-CM

## 2019-12-27 ENCOUNTER — Other Ambulatory Visit: Payer: Self-pay

## 2019-12-27 ENCOUNTER — Ambulatory Visit (INDEPENDENT_AMBULATORY_CARE_PROVIDER_SITE_OTHER): Payer: Self-pay

## 2019-12-27 DIAGNOSIS — Z23 Encounter for immunization: Secondary | ICD-10-CM

## 2020-01-12 ENCOUNTER — Inpatient Hospital Stay (HOSPITAL_COMMUNITY)
Admission: EM | Admit: 2020-01-12 | Discharge: 2020-01-15 | DRG: 638 | Disposition: A | Discharge status: Other Acute Inpatient Hospital | Payer: No Typology Code available for payment source | Attending: Family Medicine | Admitting: Family Medicine

## 2020-01-12 DIAGNOSIS — A539 Syphilis, unspecified: Secondary | ICD-10-CM | POA: Diagnosis present

## 2020-01-12 DIAGNOSIS — E160 Drug-induced hypoglycemia without coma: Secondary | ICD-10-CM | POA: Diagnosis present

## 2020-01-12 DIAGNOSIS — Z794 Long term (current) use of insulin: Secondary | ICD-10-CM

## 2020-01-12 DIAGNOSIS — E162 Hypoglycemia, unspecified: Secondary | ICD-10-CM

## 2020-01-12 DIAGNOSIS — T383X5A Adverse effect of insulin and oral hypoglycemic [antidiabetic] drugs, initial encounter: Secondary | ICD-10-CM | POA: Diagnosis present

## 2020-01-12 DIAGNOSIS — E10649 Type 1 diabetes mellitus with hypoglycemia without coma: Principal | ICD-10-CM | POA: Diagnosis present

## 2020-01-12 DIAGNOSIS — F10239 Alcohol dependence with withdrawal, unspecified: Secondary | ICD-10-CM | POA: Diagnosis present

## 2020-01-12 DIAGNOSIS — Z886 Allergy status to analgesic agent status: Secondary | ICD-10-CM

## 2020-01-12 DIAGNOSIS — Z20822 Contact with and (suspected) exposure to covid-19: Secondary | ICD-10-CM | POA: Diagnosis present

## 2020-01-12 DIAGNOSIS — E109 Type 1 diabetes mellitus without complications: Secondary | ICD-10-CM | POA: Diagnosis present

## 2020-01-12 DIAGNOSIS — R197 Diarrhea, unspecified: Secondary | ICD-10-CM | POA: Diagnosis present

## 2020-01-12 DIAGNOSIS — Z833 Family history of diabetes mellitus: Secondary | ICD-10-CM

## 2020-01-12 DIAGNOSIS — F17290 Nicotine dependence, other tobacco product, uncomplicated: Secondary | ICD-10-CM | POA: Diagnosis present

## 2020-01-12 DIAGNOSIS — R6510 Systemic inflammatory response syndrome (SIRS) of non-infectious origin without acute organ dysfunction: Secondary | ICD-10-CM | POA: Diagnosis present

## 2020-01-12 DIAGNOSIS — F419 Anxiety disorder, unspecified: Secondary | ICD-10-CM | POA: Diagnosis present

## 2020-01-12 DIAGNOSIS — Z21 Asymptomatic human immunodeficiency virus [HIV] infection status: Secondary | ICD-10-CM

## 2020-01-12 DIAGNOSIS — Z8349 Family history of other endocrine, nutritional and metabolic diseases: Secondary | ICD-10-CM

## 2020-01-12 DIAGNOSIS — B20 Human immunodeficiency virus [HIV] disease: Secondary | ICD-10-CM | POA: Diagnosis present

## 2020-01-12 DIAGNOSIS — N179 Acute kidney failure, unspecified: Secondary | ICD-10-CM | POA: Diagnosis not present

## 2020-01-12 DIAGNOSIS — F32A Depression, unspecified: Secondary | ICD-10-CM | POA: Diagnosis present

## 2020-01-12 DIAGNOSIS — Z8619 Personal history of other infectious and parasitic diseases: Secondary | ICD-10-CM

## 2020-01-12 DIAGNOSIS — R45851 Suicidal ideations: Secondary | ICD-10-CM | POA: Diagnosis present

## 2020-01-12 DIAGNOSIS — Z79899 Other long term (current) drug therapy: Secondary | ICD-10-CM

## 2020-01-12 DIAGNOSIS — R112 Nausea with vomiting, unspecified: Secondary | ICD-10-CM | POA: Diagnosis present

## 2020-01-12 LAB — CBG MONITORING, ED: Glucose-Capillary: 33 mg/dL — CL (ref 70–99)

## 2020-01-12 MED ORDER — DEXTROSE 50 % IV SOLN
50.0000 mL | Freq: Once | INTRAVENOUS | Status: AC
  Administered 2020-01-12: 50 mL via INTRAVENOUS
  Filled 2020-01-12: qty 50

## 2020-01-12 MED ORDER — ONDANSETRON HCL 4 MG/2ML IJ SOLN
4.0000 mg | Freq: Once | INTRAMUSCULAR | Status: DC
  Filled 2020-01-12: qty 2

## 2020-01-12 MED ORDER — LOPERAMIDE HCL 2 MG PO CAPS
4.0000 mg | ORAL_CAPSULE | Freq: Once | ORAL | Status: AC
  Administered 2020-01-13: 4 mg via ORAL
  Filled 2020-01-12: qty 2

## 2020-01-12 MED ORDER — LACTATED RINGERS IV BOLUS
1000.0000 mL | Freq: Once | INTRAVENOUS | Status: AC
  Administered 2020-01-13: 1000 mL via INTRAVENOUS

## 2020-01-12 NOTE — ED Provider Notes (Signed)
[facility] EMERGENCY DEPARTMENT Provider Note   CSN: [id number] Arrival date & time: 01/12/20  2328   History Chief Complaint  Patient presents with  . Hypoglycemia    [name] is a 34 y.o. male.  The history is provided by the patient.  He has history of diabetes, HIV infection and comes in because of low blood sugar. He has had malaise for about 24 hours and started having vomiting and diarrhea this afternoon. He developed the drenching sweat at work but denies fever or chills. His blood sugar dropped low when he called for EMS who came and noted low blood sugar and gave him glucagon and left. Sugar dropped down again and he called for EMS again who noted glucose of 44 and gave him intravenous glucose and transported him. He states that his nausea has improved slightly and he has not had any diarrhea in the last hour. Denies abdominal pain but still has no appetite. He denies any sick contacts and denies any suspicious food intake.  Past Medical History:  Diagnosis Date  . Anxiety   . Asthma    no prior hospitalizations, intubations  . Depression   . Headache    "only w/stress" (11/01/2017)  . HIV (human immunodeficiency virus infection) ([place])   . Migraine    "a few/month" (11/01/2017)  . Refusal of blood transfusions as patient is Jehovah's Witness   . Seizure ([place])    "only w/my low blood sugars"  (11/01/2017)  . Type I diabetes mellitus [facility])     Patient Active Problem List   Diagnosis Date Noted  . Healthcare maintenance 12/05/2019  . Hypoglycemia 03/31/2019  . Syphilis 01/15/2019  . Closed fracture of base of fifth metatarsal bone of right foot 11/13/2017  . Closed displaced comminuted fracture of shaft of right tibia 11/01/2017  . Closed fracture of medial malleolus of right ankle 10/31/2017  . Vaccine counseling 12/14/2016  . Screening examination for venereal disease 06/09/2015  . Encounter for long-term (current) use of medications  06/09/2015  . Depression 03/04/2015  . Dizziness and giddiness 03/04/2015  . Tobacco dependence 11/12/2014  . Human immunodeficiency virus (HIV) disease ([place]) 11/11/2014  . Stress 12/19/2012  . Asthma 10/02/2010  . Diabetes mellitus type 1 ([place]) 03/01/1991    Past Surgical History:  Procedure Laterality Date  . FRACTURE SURGERY    . IM NAILING TIBIA Right 11/01/2017   INTRAMEDULLARY (IM) NAIL TIBIALRightGeneral  . ORIF ANKLE FRACTURE Right 11/01/2017  . ORIF ANKLE FRACTURE Right 11/01/2017   Procedure: OPEN REDUCTION INTERNAL FIXATION (ORIF) ANKLE FRACTURE;  Surgeon: Shona Needles, MD;  Location: [facility];  Service: Orthopedics;  Laterality: Right;  . TIBIA IM NAIL INSERTION Right 11/01/2017   Procedure: INTRAMEDULLARY (IM) NAIL TIBIAL;  Surgeon: Shona Needles, MD;  Location: [facility];  Service: Orthopedics;  Laterality: Right;       Family History  Problem Relation Age of Onset  . Thyroid disease Mother   . Diabetes Mother   . Breast cancer Maternal Grandmother   . Diabetes Maternal Grandmother     Social History   Tobacco Use  . Smoking status: Current Every Day Smoker    Types: Cigars    Start date: 09/29/2015  . Smokeless tobacco: Never Used  . Tobacco comment: 11/01/2017  "2 cigarettes in ~ 6 month "  Vaping Use  . Vaping Use: Never used  Substance Use Topics  . Alcohol use: Yes    Alcohol/week: 7.0 standard drinks  Types: 7 Cans of beer per week  . Drug use: Not Currently    Frequency: 2.0 times per week    Types: Marijuana    Comment: 11/01/2017  "weekly"    Home Medications Prior to Admission medications   Medication Sig Start Date End Date Taking? Authorizing Provider  acetaminophen (TYLENOL) 500 MG tablet Take 1 tablet (500 mg total) by mouth every 6 (six) hours as needed. 02/10/15   Dorena Dew, FNP  Blood Glucose Monitoring Suppl (TRUE METRIX METER) w/Device KIT Use as instructed. Check blood glucose level by fingerstick three times per day. 03/08/19    Gildardo Pounds, NP  citalopram (CELEXA) 20 MG tablet TAKE 1 TABLET BY MOUTH DAILY 12/19/19   Comer, Okey Regal, MD  feeding supplement (BOOST HIGH PROTEIN) LIQD Take 1 Container by mouth 2 (two) times daily as needed (supplement).     [provider]  GENVOYA 150-150-200-10 MG TABS tablet TAKE 1 TABLET BY MOUTH DAILY WITH BREAKFAST 12/19/19   Mignon Pine, DO  glucose blood (TRUE METRIX BLOOD GLUCOSE TEST) test strip Use as instructed 03/08/19   Gildardo Pounds, NP  ibuprofen (ADVIL,MOTRIN) 200 MG tablet Take 600 mg by mouth every 6 (six) hours as needed for moderate pain.    [provider]  insulin aspart protamine- aspart (NOVOLOG MIX 70/30) (70-30) 100 UNIT/ML injection Inject 36-50 Units into the skin See admin instructions. 50 units in the morning, 36 units in the evening    [provider]  Insulin Pen Needle (B-D UF III MINI PEN NEEDLES) 31G X 5 MM MISC Use as instructed. Inject into the skin four times daily. ICD 10 E10.42 03/08/19   Gildardo Pounds, NP  Multiple Vitamins-Minerals (MULTIVITAMIN WITH MINERALS) tablet Take 1 tablet by mouth daily.    [provider]  ondansetron (ZOFRAN) 4 MG tablet Take 1 tablet (4 mg total) by mouth every 6 (six) hours. 10/20/19   Orpah Greek, MD  TRUEplus Lancets 28G MISC Use as instructed. Check blood glucose level by fingerstick three per day. 03/08/19   Gildardo Pounds, NP  insulin lispro (HUMALOG KWIKPEN) 100 UNIT/ML KwikPen Inject 0.1 mLs (10 Units total) into the skin 3 (three) times daily with meals. ICD 10 E10.42 Patient not taking: Reported on 03/31/2019 03/08/19 07/08/19  Gildardo Pounds, NP  pravastatin (PRAVACHOL) 20 MG tablet Take 1 tablet (20 mg total) by mouth daily. Patient not taking: Reported on 03/31/2019 03/08/19 07/08/19  Gildardo Pounds, NP    Allergies    Shellfish allergy and Aspirin  Review of Systems   Review of Systems  All other systems reviewed and are negative.   Physical  Exam Updated Vital Signs BP (!) 141/94 (BP Location: Right Arm)   Pulse (!) 59   Temp (!) 97.3 F (36.3 C) (Oral)   Resp 16   SpO2 100%   Physical Exam Vitals and nursing note reviewed.   34 year old male, resting comfortably and in no acute distress. Vital signs are significant for borderline elevated blood pressure and borderline low heart rate. Oxygen saturation is 100%, which is normal. Head is normocephalic and atraumatic. PERRLA, EOMI. Oropharynx is clear. Neck is nontender and supple without adenopathy or JVD. Back is nontender and there is no CVA tenderness. Lungs are clear without rales, wheezes, or rhonchi. Chest is nontender. Heart has regular rate and rhythm without murmur. Abdomen is soft, flat, nontender without masses or hepatosplenomegaly and peristalsis is hypoactive. Extremities have  no cyanosis or edema, full range of motion is present. Skin is warm and dry without rash. Neurologic: Mental status is normal, cranial nerves are intact, there are no motor or sensory deficits.  ED Results / Procedures / Treatments   Labs (all labs ordered are listed, but only abnormal results are displayed) Labs Reviewed  COMPREHENSIVE METABOLIC PANEL - Abnormal; Notable for the following components:      Result Value   Glucose, Bld 173 (*)    All other components within normal limits  CBC WITH DIFFERENTIAL/PLATELET - Abnormal; Notable for the following components:   WBC 13.5 (*)    Neutro Abs 12.1 (*)    Lymphs Abs 0.5 (*)    All other components within normal limits  CBG MONITORING, ED - Abnormal; Notable for the following components:   Glucose-Capillary 33 (*)    All other components within normal limits  CBG MONITORING, ED - Abnormal; Notable for the following components:   Glucose-Capillary 114 (*)    All other components within normal limits  CBG MONITORING, ED - Abnormal; Notable for the following components:   Glucose-Capillary 58 (*)    All other components within  normal limits  RESPIRATORY PANEL BY RT PCR (FLU A&B, COVID)  LIPASE, BLOOD  URINALYSIS, ROUTINE W REFLEX MICROSCOPIC  CBG MONITORING, ED   Procedures Procedures  CRITICAL CARE Performed by: Delora Fuel Total critical care time: 40 minutes Critical care time was exclusive of separately billable procedures and treating other patients. Critical care was necessary to treat or prevent imminent or life-threatening deterioration. Critical care was time spent personally by me on the following activities: development of treatment plan with patient and/or surrogate as well as nursing, discussions with consultants, evaluation of patient's response to treatment, examination of patient, obtaining history from patient or surrogate, ordering and performing treatments and interventions, ordering and review of laboratory studies, ordering and review of radiographic studies, pulse oximetry and re-evaluation of patient's condition.  Medications Ordered in ED Medications  ondansetron (ZOFRAN) injection 4 mg (4 mg Intravenous Not Given 01/13/20 0019)  dextrose 10 % infusion (has no administration in time range)  lactated ringers bolus 1,000 mL (1,000 mLs Intravenous New Bag/Given 01/13/20 0003)  loperamide (IMODIUM) capsule 4 mg (4 mg Oral Given 01/13/20 0004)  dextrose 50 % solution 50 mL (50 mLs Intravenous Given 01/12/20 2354)    ED Course  I have reviewed the triage vital signs and the nursing notes.  Pertinent labs & imaging results that were available during my care of the patient were reviewed by me and considered in my medical decision making (see chart for details).  MDM Rules/Calculators/A&P Hypoglycemia which is likely secondary to poor oral intake. Nausea, vomiting, diarrhea and pattern suspicious for viral gastroenteritis. Possible food poisoning. No red flags to suggest more serious pathology. Old records are reviewed, and he has prior ED visits and hospitalizations for hypoglycemia. He will  be given IV fluids, ondansetron, oral loperamide and will check screening labs.  Glucose continues to fall, will need to be admitted for glucose infusion. Case is discussed with Dr. Myna Hidalgo of [facility], who agrees to admit the patient.  Final Clinical Impression(s) / ED Diagnoses Final diagnoses:  Hypoglycemia  Nausea vomiting and diarrhea  HIV infection, unspecified symptom status ([place])    Rx / DC Orders ED Discharge Orders    None       Delora Fuel, MD 40/97/35 [id number]

## 2020-01-12 NOTE — ED Triage Notes (Signed)
Per EMS, pt from home, called out twice for low blood sugar.  Earlier this evening they gave glucagon and left, called back for abdominal pain/emesis and hypoglycemia.  Pt reported he did not eat this afternoon due to abd pain but took his regular insulin.  EMS found his CBG at 43.  Gave 100cc D/10, CBG increased to 110.

## 2020-01-13 ENCOUNTER — Encounter (HOSPITAL_COMMUNITY): Payer: Self-pay | Admitting: Emergency Medicine

## 2020-01-13 ENCOUNTER — Other Ambulatory Visit: Payer: Self-pay

## 2020-01-13 DIAGNOSIS — B20 Human immunodeficiency virus [HIV] disease: Secondary | ICD-10-CM

## 2020-01-13 DIAGNOSIS — F32A Depression, unspecified: Secondary | ICD-10-CM

## 2020-01-13 DIAGNOSIS — E1021 Type 1 diabetes mellitus with diabetic nephropathy: Secondary | ICD-10-CM

## 2020-01-13 DIAGNOSIS — R6510 Systemic inflammatory response syndrome (SIRS) of non-infectious origin without acute organ dysfunction: Secondary | ICD-10-CM | POA: Diagnosis present

## 2020-01-13 LAB — BASIC METABOLIC PANEL
Anion gap: 11 (ref 5–15)
BUN: 17 mg/dL (ref 6–20)
CO2: 26 mmol/L (ref 22–32)
Calcium: 9.9 mg/dL (ref 8.9–10.3)
Chloride: 102 mmol/L (ref 98–111)
Creatinine, Ser: 1.02 mg/dL (ref 0.61–1.24)
GFR, Estimated: >60 mL/min (ref >60)
Glucose, Bld: 55 mg/dL — ABNORMAL LOW (ref 70–99)
Potassium: 4.4 mmol/L (ref 3.5–5.1)
Sodium: 139 mmol/L (ref 135–145)

## 2020-01-13 LAB — COMPREHENSIVE METABOLIC PANEL
ALT: 16 U/L (ref 0–44)
ALT: 18 U/L (ref 0–44)
AST: 25 U/L (ref 15–41)
AST: 26 U/L (ref 15–41)
Albumin: 3.6 g/dL (ref 3.5–5.0)
Albumin: 4.1 g/dL (ref 3.5–5.0)
Alkaline Phosphatase: 52 U/L (ref 38–126)
Alkaline Phosphatase: 53 U/L (ref 38–126)
Anion gap: 11 (ref 5–15)
Anion gap: 12 (ref 5–15)
BUN: 13 mg/dL (ref 6–20)
BUN: 18 mg/dL (ref 6–20)
CO2: 26 mmol/L (ref 22–32)
CO2: 28 mmol/L (ref 22–32)
Calcium: 9.4 mg/dL (ref 8.9–10.3)
Calcium: 9.8 mg/dL (ref 8.9–10.3)
Chloride: 99 mmol/L (ref 98–111)
Chloride: 99 mmol/L (ref 98–111)
Creatinine, Ser: 1.08 mg/dL (ref 0.61–1.24)
Creatinine, Ser: 1.22 mg/dL (ref 0.61–1.24)
GFR, Estimated: >60 mL/min (ref >60)
GFR, Estimated: >60 mL/min (ref >60)
Glucose, Bld: 131 mg/dL — ABNORMAL HIGH (ref 70–99)
Glucose, Bld: 173 mg/dL — ABNORMAL HIGH (ref 70–99)
Potassium: 3.6 mmol/L (ref 3.5–5.1)
Potassium: 4.4 mmol/L (ref 3.5–5.1)
Sodium: 136 mmol/L (ref 135–145)
Sodium: 139 mmol/L (ref 135–145)
Total Bilirubin: 1 mg/dL (ref 0.3–1.2)
Total Bilirubin: 1.1 mg/dL (ref 0.3–1.2)
Total Protein: 6.5 g/dL (ref 6.5–8.1)
Total Protein: 7.2 g/dL (ref 6.5–8.1)

## 2020-01-13 LAB — URINALYSIS, ROUTINE W REFLEX MICROSCOPIC
Bacteria, UA: NONE SEEN
Bilirubin Urine: NEGATIVE
Glucose, UA: 150 mg/dL — AB
Hgb urine dipstick: NEGATIVE
Ketones, ur: 20 mg/dL — AB
Leukocytes,Ua: NEGATIVE
Nitrite: NEGATIVE
Protein, ur: >=300 mg/dL — AB
Specific Gravity, Urine: 1.026 (ref 1.005–1.030)
pH: 6 (ref 5.0–8.0)

## 2020-01-13 LAB — CBG MONITORING, ED
Glucose-Capillary: 102 mg/dL — ABNORMAL HIGH (ref 70–99)
Glucose-Capillary: 114 mg/dL — ABNORMAL HIGH (ref 70–99)
Glucose-Capillary: 121 mg/dL — ABNORMAL HIGH (ref 70–99)
Glucose-Capillary: 124 mg/dL — ABNORMAL HIGH (ref 70–99)
Glucose-Capillary: 134 mg/dL — ABNORMAL HIGH (ref 70–99)
Glucose-Capillary: 171 mg/dL — ABNORMAL HIGH (ref 70–99)
Glucose-Capillary: 172 mg/dL — ABNORMAL HIGH (ref 70–99)
Glucose-Capillary: 215 mg/dL — ABNORMAL HIGH (ref 70–99)
Glucose-Capillary: 36 mg/dL — CL (ref 70–99)
Glucose-Capillary: 57 mg/dL — ABNORMAL LOW (ref 70–99)
Glucose-Capillary: 58 mg/dL — ABNORMAL LOW (ref 70–99)
Glucose-Capillary: 60 mg/dL — ABNORMAL LOW (ref 70–99)
Glucose-Capillary: 67 mg/dL — ABNORMAL LOW (ref 70–99)
Glucose-Capillary: 68 mg/dL — ABNORMAL LOW (ref 70–99)
Glucose-Capillary: 87 mg/dL (ref 70–99)

## 2020-01-13 LAB — CBC WITH DIFFERENTIAL/PLATELET
Abs Immature Granulocytes: 0.05 10*3/uL (ref 0.00–0.07)
Abs Immature Granulocytes: 0.06 10*3/uL (ref 0.00–0.07)
Basophils Absolute: 0 10*3/uL (ref 0.0–0.1)
Basophils Absolute: 0 10*3/uL (ref 0.0–0.1)
Basophils Relative: 0 %
Basophils Relative: 0 %
Eosinophils Absolute: 0 10*3/uL (ref 0.0–0.5)
Eosinophils Absolute: 0 10*3/uL (ref 0.0–0.5)
Eosinophils Relative: 0 %
Eosinophils Relative: 0 %
HCT: 44.3 % (ref 39.0–52.0)
HCT: 46.2 % (ref 39.0–52.0)
Hemoglobin: 15.5 g/dL (ref 13.0–17.0)
Hemoglobin: 15.7 g/dL (ref 13.0–17.0)
Immature Granulocytes: 0 %
Immature Granulocytes: 1 %
Lymphocytes Relative: 4 %
Lymphocytes Relative: 8 %
Lymphs Abs: 0.5 10*3/uL — ABNORMAL LOW (ref 0.7–4.0)
Lymphs Abs: 0.9 10*3/uL (ref 0.7–4.0)
MCH: 31.6 pg (ref 26.0–34.0)
MCH: 32.4 pg (ref 26.0–34.0)
MCHC: 34 g/dL (ref 30.0–36.0)
MCHC: 35 g/dL (ref 30.0–36.0)
MCV: 92.5 fL (ref 80.0–100.0)
MCV: 93 fL (ref 80.0–100.0)
Monocytes Absolute: 0.5 10*3/uL (ref 0.1–1.0)
Monocytes Absolute: 0.8 10*3/uL (ref 0.1–1.0)
Monocytes Relative: 5 %
Monocytes Relative: 6 %
Neutro Abs: 12.1 10*3/uL — ABNORMAL HIGH (ref 1.7–7.7)
Neutro Abs: 9.8 10*3/uL — ABNORMAL HIGH (ref 1.7–7.7)
Neutrophils Relative %: 86 %
Neutrophils Relative %: 90 %
Platelets: 259 10*3/uL (ref 150–400)
Platelets: 263 10*3/uL (ref 150–400)
RBC: 4.79 MIL/uL (ref 4.22–5.81)
RBC: 4.97 MIL/uL (ref 4.22–5.81)
RDW: 13.2 % (ref 11.5–15.5)
RDW: 13.3 % (ref 11.5–15.5)
WBC: 11.3 10*3/uL — ABNORMAL HIGH (ref 4.0–10.5)
WBC: 13.5 10*3/uL — ABNORMAL HIGH (ref 4.0–10.5)
nRBC: 0 % (ref 0.0–0.2)
nRBC: 0 % (ref 0.0–0.2)

## 2020-01-13 LAB — CBC
HCT: 43.7 % (ref 39.0–52.0)
Hemoglobin: 14.9 g/dL (ref 13.0–17.0)
MCH: 32 pg (ref 26.0–34.0)
MCHC: 34.1 g/dL (ref 30.0–36.0)
MCV: 93.8 fL (ref 80.0–100.0)
Platelets: 266 10*3/uL (ref 150–400)
RBC: 4.66 MIL/uL (ref 4.22–5.81)
RDW: 13.6 % (ref 11.5–15.5)
WBC: 9.2 10*3/uL (ref 4.0–10.5)
nRBC: 0 % (ref 0.0–0.2)

## 2020-01-13 LAB — GLUCOSE, CAPILLARY
Glucose-Capillary: 206 mg/dL — ABNORMAL HIGH (ref 70–99)
Glucose-Capillary: 210 mg/dL — ABNORMAL HIGH (ref 70–99)
Glucose-Capillary: 331 mg/dL — ABNORMAL HIGH (ref 70–99)
Glucose-Capillary: 253 mg/dL — ABNORMAL HIGH (ref 70–99)

## 2020-01-13 LAB — RESPIRATORY PANEL BY RT PCR (FLU A&B, COVID)
Influenza A by PCR: NEGATIVE
Influenza B by PCR: NEGATIVE
SARS Coronavirus 2 by RT PCR: NEGATIVE

## 2020-01-13 LAB — PHOSPHORUS: Phosphorus: 4.1 mg/dL (ref 2.5–4.6)

## 2020-01-13 LAB — LIPASE, BLOOD: Lipase: 23 U/L (ref 11–51)

## 2020-01-13 LAB — MAGNESIUM: Magnesium: 1.8 mg/dL (ref 1.7–2.4)

## 2020-01-13 LAB — MRSA PCR SCREENING: MRSA by PCR: NEGATIVE

## 2020-01-13 MED ORDER — ONDANSETRON HCL 4 MG PO TABS: 4.0000 mg | ORAL_TABLET | Freq: Four times a day (QID) | ORAL | Status: DC | PRN

## 2020-01-13 MED ORDER — THIAMINE HCL 100 MG PO TABS
100.0000 mg | ORAL_TABLET | Freq: Every day | ORAL | Status: DC
  Administered 2020-01-13 – 2020-01-15 (×3): 100 mg via ORAL
  Filled 2020-01-13 (×4): qty 1

## 2020-01-13 MED ORDER — DEXTROSE 10 % IV SOLN: INTRAVENOUS | Status: AC

## 2020-01-13 MED ORDER — ENOXAPARIN SODIUM 40 MG/0.4ML ~~LOC~~ SOLN
40.0000 mg | SUBCUTANEOUS | Status: DC
  Administered 2020-01-13 – 2020-01-14 (×2): 40 mg via SUBCUTANEOUS
  Filled 2020-01-13 (×2): qty 0.4

## 2020-01-13 MED ORDER — INSULIN ASPART 100 UNIT/ML ~~LOC~~ SOLN
0.0000 [IU] | SUBCUTANEOUS | Status: DC
  Administered 2020-01-13: 5 [IU] via SUBCUTANEOUS
  Administered 2020-01-14 (×4): 3 [IU] via SUBCUTANEOUS
  Administered 2020-01-14: 1 [IU] via SUBCUTANEOUS
  Administered 2020-01-14: 5 [IU] via SUBCUTANEOUS
  Administered 2020-01-15: 1 [IU] via SUBCUTANEOUS
  Administered 2020-01-15: 5 [IU] via SUBCUTANEOUS
  Administered 2020-01-15: 3 [IU] via SUBCUTANEOUS

## 2020-01-13 MED ORDER — THIAMINE HCL 100 MG/ML IJ SOLN
100.0000 mg | Freq: Every day | INTRAMUSCULAR | Status: DC
  Filled 2020-01-13: qty 2

## 2020-01-13 MED ORDER — ONDANSETRON HCL 4 MG/2ML IJ SOLN
4.0000 mg | Freq: Four times a day (QID) | INTRAMUSCULAR | Status: DC | PRN
  Administered 2020-01-13 – 2020-01-14 (×2): 4 mg via INTRAVENOUS
  Filled 2020-01-13 (×2): qty 2

## 2020-01-13 MED ORDER — DEXTROSE 10 % IV SOLN: INTRAVENOUS | Status: DC

## 2020-01-13 MED ORDER — ACETAMINOPHEN 650 MG RE SUPP: 650.0000 mg | Freq: Four times a day (QID) | RECTAL | Status: DC | PRN

## 2020-01-13 MED ORDER — LORAZEPAM 2 MG/ML IJ SOLN
INTRAMUSCULAR | Status: AC
  Administered 2020-01-13: 3 mg via INTRAVENOUS
  Filled 2020-01-13: qty 2

## 2020-01-13 MED ORDER — LORAZEPAM 2 MG/ML IJ SOLN: 1.0000 mg | INTRAMUSCULAR | Status: DC | PRN

## 2020-01-13 MED ORDER — DEXTROSE 50 % IV SOLN
INTRAVENOUS | Status: AC
  Administered 2020-01-13: 50 mL via INTRAVENOUS
  Filled 2020-01-13: qty 50

## 2020-01-13 MED ORDER — FOLIC ACID 1 MG PO TABS
1.0000 mg | ORAL_TABLET | Freq: Every day | ORAL | Status: DC
  Administered 2020-01-13 – 2020-01-15 (×3): 1 mg via ORAL
  Filled 2020-01-13 (×3): qty 1

## 2020-01-13 MED ORDER — HYDRALAZINE HCL 10 MG PO TABS
10.0000 mg | ORAL_TABLET | Freq: Four times a day (QID) | ORAL | Status: DC | PRN
  Administered 2020-01-13 – 2020-01-15 (×4): 10 mg via ORAL
  Filled 2020-01-13 (×5): qty 1

## 2020-01-13 MED ORDER — ADULT MULTIVITAMIN W/MINERALS CH
1.0000 | ORAL_TABLET | Freq: Every day | ORAL | Status: DC
  Administered 2020-01-13 – 2020-01-15 (×3): 1 via ORAL
  Filled 2020-01-13 (×3): qty 1

## 2020-01-13 MED ORDER — ELVITEG-COBIC-EMTRICIT-TENOFAF 150-150-200-10 MG PO TABS
1.0000 | ORAL_TABLET | Freq: Every day | ORAL | Status: DC
  Administered 2020-01-13 – 2020-01-15 (×3): 1 via ORAL
  Filled 2020-01-13 (×3): qty 1

## 2020-01-13 MED ORDER — LORAZEPAM 1 MG PO TABS: 1.0000 mg | ORAL_TABLET | ORAL | Status: DC | PRN

## 2020-01-13 MED ORDER — CITALOPRAM HYDROBROMIDE 20 MG PO TABS
20.0000 mg | ORAL_TABLET | Freq: Every day | ORAL | Status: DC
  Administered 2020-01-13 – 2020-01-14 (×2): 20 mg via ORAL
  Filled 2020-01-13: qty 1
  Filled 2020-01-13: qty 2

## 2020-01-13 MED ORDER — ACETAMINOPHEN 325 MG PO TABS
650.0000 mg | ORAL_TABLET | Freq: Four times a day (QID) | ORAL | Status: DC | PRN
  Administered 2020-01-13: 650 mg via ORAL
  Filled 2020-01-13: qty 2

## 2020-01-13 NOTE — Progress Notes (Signed)
Patient with history of depression, HIV, IDDM, who presented to the hospital with nausea and vomiting. He has a hisoty of alcohol dependency and has been placed on CIWA protocol. His home medication Celexa 20mg  po daily has also been resumed. Patient to benefit from substance abuse referral, which can be facilitated by medical floor LCSW. If he is interested in outpatient therapy, he can benefit from [facility] integrated behavioral health program.

## 2020-01-13 NOTE — Progress Notes (Signed)
Inpatient Diabetes Program Recommendations  AACE/ADA: New Consensus Statement on Inpatient Glycemic Control (2015)  Target Ranges:  Prepandial:   less than 140 mg/dL      Peak postprandial:   less than 180 mg/dL (1-2 hours)      Critically ill patients:  140 - 180 mg/dL   Lab Results  Component Value Date   GLUCAP 102 (H) 01/13/2020   HGBA1C 7.8 (H) 12/06/2019    Review of Glycemic Control Results for [name], [name] (MRN [id number]) as of 01/13/2020 09:45  Ref. Range 01/13/2020 05:16 01/13/2020 05:57 01/13/2020 06:26  Glucose-Capillary Latest Ref Range: 70 - 99 mg/dL 36 (LL) 659 (H) 935 (H)   Diabetes history: DM 1 Outpatient Diabetes medications:  Novolin 70/30 50 units in the AM and 36 units in the PM Current orders for Inpatient glycemic control:  Novolog 0-7 units Q4H Inpatient Diabetes Program Recommendations:    Noted patient presents with hypoglycemia. D10% just discontinued. Once trends begin to increase, will need to add basal insulin. In the last year, patient has presented to ED 3 times with poor oral intake and hypoglycemia. Has been seen at [facility] in the past. Following.  Thanks, Lujean Rave, MSN, RNC-OB Diabetes Coordinator [phone] (8a-5p)

## 2020-01-13 NOTE — ED Notes (Signed)
Spoke to provider, made him aware of CBG of 33 and dextros given.  Also made him aware of addendum to triage note, 4mg  of zofran was given by EMS. Instructed to not give zofran that was ordered.

## 2020-01-13 NOTE — ED Notes (Signed)
Patient awake and finished cup of cranberry juice and 1\2 roll.

## 2020-01-13 NOTE — ED Notes (Signed)
Holding dose of genvoya due to nausea. Message sent to provider re: CIWA score of 16. Noted to be very sweaty. Tremors greater on the left hand than right.

## 2020-01-13 NOTE — Care Plan (Signed)
This 34 yrs old male with medical history significant for HIV, depression, insulin-dependent diabetes presenting in the emergency department for the evaluation of abdominal discomfort, nausea, vomiting, diarrhea and low blood sugar.  Patient has been drinking beer.  He was found to be hypoglycemic.  He was given glucagon but continued to be remain hypoglycemic.  Patient was admitted for recurrent hypoglycemia,  started on D10 W, Patient has withdrawal symptoms from alcohol presenting with shaking,  sweating started on CIWA protocol.  Patient was seen and examined the ED. Patient will be closely monitored.

## 2020-01-13 NOTE — ED Notes (Signed)
Pt's CBG is 33, informed provider who gave verbal for D50.  As RN was trying to administer, pt complained of pain where D50 was being pushed.  RN had to obtain another IV in order to finish administering the D50.  Pt was alert and oriented.    Admitting provider changed pt's admission orders to progressive.  Charge RN made aware of change.

## 2020-01-13 NOTE — H&P (Signed)
History and Physical    Zebulin Michai Dieppa WJX:[id number] DOB: 1985-09-04 DOA: 01/12/2020  PCP: Patient, No Pcp Per   Patient coming from: Home   Chief Complaint: Low blood sugar, N/V/D   HPI: Dequante Danirus Laurel is a 34 y.o. male with medical history significant for HIV (CD4 641 and VL <20 in September 2021), depression, and insulin-dependent diabetes mellitus, now presenting to the emergency department for evaluation of abdominal discomfort, nausea, vomiting, diarrhea, and low blood sugar.  Patient reports that he developed a general malaise with nausea, couple episodes of nonbloody vomiting, and nonbloody diarrhea approximately 24 hours ago.  He took his insulin but did not eat anything yesterday due to the GI symptoms.  He went on to develop drenching sweats and found that his glucose was low.  EMS was called, CBG was low, and he was given glucagon.  He ended up calling EMS again for second episode of hypoglycemia, was given 100 cc 10% dextrose, and brought into the ED.  Patient describes some generalized abdominal discomfort, reports that his nausea has improved and he has not in several hours.  Reports that he had too many loose stools to count over the past 24 hours, but none since arrival in the ED.  ED Course: Upon arrival to the ED, patient is found to be afebrile, saturating well on room air, slightly tachycardic, and with stable blood pressure.  Chemistry panel is unremarkable and CBC notable for a leukocytosis to 13,500.  CBG was 33 in the emergency department, treated with D50, improved, but then he became hypoglycemic again despite eating and drinking, and was started on 10% dextrose infusion.  Review of Systems:  All other systems reviewed and apart from HPI, are negative.  Past Medical History:  Diagnosis Date  . Anxiety   . Asthma    no prior hospitalizations, intubations  . Depression   . Headache    "only w/stress" (11/01/2017)  . HIV (human immunodeficiency virus  infection) ([place])   . Migraine    "a few/month" (11/01/2017)  . Refusal of blood transfusions as patient is Jehovah's Witness   . Seizure ([place])    "only w/my low blood sugars"  (11/01/2017)  . Type I diabetes mellitus ([place])     Past Surgical History:  Procedure Laterality Date  . FRACTURE SURGERY    . IM NAILING TIBIA Right 11/01/2017   INTRAMEDULLARY (IM) NAIL TIBIALRightGeneral  . ORIF ANKLE FRACTURE Right 11/01/2017  . ORIF ANKLE FRACTURE Right 11/01/2017   Procedure: OPEN REDUCTION INTERNAL FIXATION (ORIF) ANKLE FRACTURE;  Surgeon: Shona Needles, MD;  Location: [facility];  Service: Orthopedics;  Laterality: Right;  . TIBIA IM NAIL INSERTION Right 11/01/2017   Procedure: INTRAMEDULLARY (IM) NAIL TIBIAL;  Surgeon: Shona Needles, MD;  Location: [place] [facility];  Service: Orthopedics;  Laterality: Right;    Social History:   reports that he has been smoking cigars. He started smoking about 4 years ago. He has never used smokeless tobacco. He reports current alcohol use of about 7.0 standard drinks of alcohol per week. He reports previous drug use. Frequency: 2.00 times per week. Drug: Marijuana.  Allergies  Allergen Reactions  . Shellfish Allergy Anaphylaxis and Hives  . Aspirin     Family History  Problem Relation Age of Onset  . Thyroid disease Mother   . Diabetes Mother   . Breast cancer Maternal Grandmother   . Diabetes Maternal Grandmother      Prior to Admission medications   Medication Sig  Start Date End Date Taking? Authorizing Provider  acetaminophen (TYLENOL) 500 MG tablet Take 1 tablet (500 mg total) by mouth every 6 (six) hours as needed. 02/10/15   Dorena Dew, FNP  Blood Glucose Monitoring Suppl (TRUE METRIX METER) w/Device KIT Use as instructed. Check blood glucose level by fingerstick three times per day. 03/08/19   Gildardo Pounds, NP  citalopram (CELEXA) 20 MG tablet TAKE 1 TABLET BY MOUTH DAILY 12/19/19   Comer, Okey Regal, MD  feeding supplement (BOOST HIGH PROTEIN)  LIQD Take 1 Container by mouth 2 (two) times daily as needed (supplement).     [provider]  GENVOYA 150-150-200-10 MG TABS tablet TAKE 1 TABLET BY MOUTH DAILY WITH BREAKFAST 12/19/19   Mignon Pine, DO  glucose blood (TRUE METRIX BLOOD GLUCOSE TEST) test strip Use as instructed 03/08/19   Gildardo Pounds, NP  ibuprofen (ADVIL,MOTRIN) 200 MG tablet Take 600 mg by mouth every 6 (six) hours as needed for moderate pain.    [provider]  insulin aspart protamine- aspart (NOVOLOG MIX 70/30) (70-30) 100 UNIT/ML injection Inject 36-50 Units into the skin See admin instructions. 50 units in the morning, 36 units in the evening    [provider]  Insulin Pen Needle (B-D UF III MINI PEN NEEDLES) 31G X 5 MM MISC Use as instructed. Inject into the skin four times daily. ICD 10 E10.42 03/08/19   Gildardo Pounds, NP  Multiple Vitamins-Minerals (MULTIVITAMIN WITH MINERALS) tablet Take 1 tablet by mouth daily.    [provider]  ondansetron (ZOFRAN) 4 MG tablet Take 1 tablet (4 mg total) by mouth every 6 (six) hours. 10/20/19   Orpah Greek, MD  TRUEplus Lancets 28G MISC Use as instructed. Check blood glucose level by fingerstick three per day. 03/08/19   Gildardo Pounds, NP  insulin lispro (HUMALOG KWIKPEN) 100 UNIT/ML KwikPen Inject 0.1 mLs (10 Units total) into the skin 3 (three) times daily with meals. ICD 10 E10.42 Patient not taking: Reported on 03/31/2019 03/08/19 07/08/19  Gildardo Pounds, NP  pravastatin (PRAVACHOL) 20 MG tablet Take 1 tablet (20 mg total) by mouth daily. Patient not taking: Reported on 03/31/2019 03/08/19 07/08/19  Gildardo Pounds, NP    Physical Exam: Vitals:   01/13/20 0045 01/13/20 0100 01/13/20 0115 01/13/20 0130  BP: 139/86 135/87 (!) 132/94 (!) 167/97  Pulse: 91 88 88 97  Resp: _0 Temp:      TempSrc:      SpO2: 100% 100% 100% 98%    Constitutional: NAD, calm  Eyes: PERTLA, lids and conjunctivae normal ENMT: Mucous  membranes are moist. Posterior pharynx clear of any exudate or lesions.   Neck: normal, supple, no masses, no thyromegaly Respiratory: no wheezing, no crackles. No accessory muscle use.  Cardiovascular: S1 & S2 heard, regular rate and rhythm. No extremity edema.   Abdomen: No distension, no tenderness, soft. Bowel sounds active.  Musculoskeletal: no clubbing / cyanosis. No joint deformity upper and lower extremities.   Skin: no significant rashes, lesions, ulcers. Warm, dry, well-perfused. Neurologic: no facial asymmetry. Sensation intact. Moving all extremities.  Psychiatric: Alert and oriented to person, place, and situation. Calm and cooperative.    Labs and Imaging on Admission: I have personally reviewed following labs and imaging studies  CBC: Recent Labs  Lab 01/13/20 0022  WBC 13.5*  NEUTROABS 12.1*  HGB 15.7  HCT 46.2  MCV 93.0  PLT 741   Basic Metabolic  Panel: Recent Labs  Lab 01/13/20 0022  NA 139  K 4.4  CL 99  CO2 28  GLUCOSE 173*  BUN 18  CREATININE 1.08  CALCIUM 9.8   GFR: CrCl cannot be calculated (Unknown ideal weight.). Liver Function Tests: Recent Labs  Lab 01/13/20 0022  AST 26  ALT 18  ALKPHOS 52  BILITOT 1.0  PROT 7.2  ALBUMIN 4.1   Recent Labs  Lab 01/13/20 0022  LIPASE 23   No results for input(s): AMMONIA in the last 168 hours. Coagulation Profile: No results for input(s): INR, PROTIME in the last 168 hours. Cardiac Enzymes: No results for input(s): CKTOTAL, CKMB, CKMBINDEX, TROPONINI in the last 168 hours. BNP (last 3 results) No results for input(s): PROBNP in the last 8760 hours. HbA1C: No results for input(s): HGBA1C in the last 72 hours. CBG: Recent Labs  Lab 01/12/20 2347 01/13/20 0013 01/13/20 0055 01/13/20 0124 01/13/20 0159  GLUCAP 33* 114* 87 58* 68*   Lipid Profile: No results for input(s): CHOL, HDL, LDLCALC, TRIG, CHOLHDL, LDLDIRECT in the last 72 hours. Thyroid Function Tests: No results for input(s):  TSH, T4TOTAL, FREET4, T3FREE, THYROIDAB in the last 72 hours. Anemia Panel: No results for input(s): VITAMINB12, FOLATE, FERRITIN, TIBC, IRON, RETICCTPCT in the last 72 hours. Urine analysis:    Component Value Date/Time   COLORURINE YELLOW 01/13/2020 0143   APPEARANCEUR CLEAR 01/13/2020 0143   LABSPEC 1.026 01/13/2020 0143   PHURINE 6.0 01/13/2020 0143   GLUCOSEU 150 (A) 01/13/2020 0143   HGBUR NEGATIVE 01/13/2020 0143   BILIRUBINUR NEGATIVE 01/13/2020 0143   KETONESUR 20 (A) 01/13/2020 0143   PROTEINUR >=300 (A) 01/13/2020 0143   UROBILINOGEN 0.2 02/10/2015 1416   NITRITE NEGATIVE 01/13/2020 0143   LEUKOCYTESUR NEGATIVE 01/13/2020 0143   Sepsis Labs: _0 (procalcitonin:4,lacticidven:4) )No results found for this or any previous visit (from the past 240 hour(s)).   Radiological Exams on Admission: No results found.  Assessment/Plan   1. Hypoglycemia; insulin-dependent DM  - Patient with IDDM, A1c 7.8% in October, presents with recurrent hypoglycemia in setting of taking insulin but not eating and has had recurrent hypoglycemic episodes in ED despite IV dextrose and encouragement to eat and drink  - Continue 10% dextrose infusion for now with frequent CBGs, encourage eating/drinking    2. HIV  - CD4 was 641 and VL <20 in September  - Syphilis was treated, titers followed by ID   - Continue Genvoya    3. Depression, anxiety  - Continue Celexa   4. SIRS  - Patient meets SIRS criteria with leukocytosis and mild tachycardia  - He is afebrile, reports N/V/D but has benign abdominal exam and no urinary sxs, no respiratory sxs, and no meningismus or apparent skin infection  - Mild tachycardia resolved with IVF and leukocytosis likely reactive though C diff possible   - Culture if febrile, check C diff     DVT prophylaxis: Lovenox  Code Status: Full  Family Communication: Discussed with patient  Disposition Plan:  Patient is from: Home  Anticipated d/c is to: Home   Anticipated d/c date is: 01/14/20 Patient currently: Requiring dextrose infusion for recurrent hypoglycemia  Consults called: None  Admission status: Observation     Vianne Bulls, MD [facility]  01/13/2020, 2:01 AM

## 2020-01-14 DIAGNOSIS — E162 Hypoglycemia, unspecified: Secondary | ICD-10-CM

## 2020-01-14 LAB — COMPREHENSIVE METABOLIC PANEL
ALT: 18 U/L (ref 0–44)
AST: 20 U/L (ref 15–41)
Albumin: 3.5 g/dL (ref 3.5–5.0)
Alkaline Phosphatase: 52 U/L (ref 38–126)
Anion gap: 9 (ref 5–15)
BUN: 16 mg/dL (ref 6–20)
CO2: 26 mmol/L (ref 22–32)
Calcium: 9.4 mg/dL (ref 8.9–10.3)
Chloride: 101 mmol/L (ref 98–111)
Creatinine, Ser: 1.36 mg/dL — ABNORMAL HIGH (ref 0.61–1.24)
GFR, Estimated: >60 mL/min (ref >60)
Glucose, Bld: 157 mg/dL — ABNORMAL HIGH (ref 70–99)
Potassium: 4 mmol/L (ref 3.5–5.1)
Sodium: 136 mmol/L (ref 135–145)
Total Bilirubin: 0.8 mg/dL (ref 0.3–1.2)
Total Protein: 6.7 g/dL (ref 6.5–8.1)

## 2020-01-14 LAB — CBC
HCT: 45.4 % (ref 39.0–52.0)
Hemoglobin: 15.8 g/dL (ref 13.0–17.0)
MCH: 32 pg (ref 26.0–34.0)
MCHC: 34.8 g/dL (ref 30.0–36.0)
MCV: 91.9 fL (ref 80.0–100.0)
Platelets: 265 10*3/uL (ref 150–400)
RBC: 4.94 MIL/uL (ref 4.22–5.81)
RDW: 13.7 % (ref 11.5–15.5)
WBC: 7.1 10*3/uL (ref 4.0–10.5)
nRBC: 0 % (ref 0.0–0.2)

## 2020-01-14 LAB — GLUCOSE, CAPILLARY
Glucose-Capillary: 181 mg/dL — ABNORMAL HIGH (ref 70–99)
Glucose-Capillary: 237 mg/dL — ABNORMAL HIGH (ref 70–99)
Glucose-Capillary: 264 mg/dL — ABNORMAL HIGH (ref 70–99)
Glucose-Capillary: 266 mg/dL — ABNORMAL HIGH (ref 70–99)
Glucose-Capillary: 277 mg/dL — ABNORMAL HIGH (ref 70–99)
Glucose-Capillary: 296 mg/dL — ABNORMAL HIGH (ref 70–99)
Glucose-Capillary: 348 mg/dL — ABNORMAL HIGH (ref 70–99)

## 2020-01-14 LAB — PHOSPHORUS: Phosphorus: 3.4 mg/dL (ref 2.5–4.6)

## 2020-01-14 LAB — MAGNESIUM: Magnesium: 1.8 mg/dL (ref 1.7–2.4)

## 2020-01-14 MED ORDER — CITALOPRAM HYDROBROMIDE 20 MG PO TABS
30.0000 mg | ORAL_TABLET | Freq: Every day | ORAL | Status: DC
  Administered 2020-01-15: 30 mg via ORAL
  Filled 2020-01-14: qty 2

## 2020-01-14 NOTE — Plan of Care (Signed)
  Problem: Skin Integrity: Goal: Risk for impaired skin integrity will decrease Outcome: Progressing   Problem: Safety: Goal: Ability to remain free from injury will improve Outcome: Progressing   Problem: Pain Managment: Goal: General experience of comfort will improve Outcome: Progressing   Problem: Elimination: Goal: Will not experience complications related to urinary retention Outcome: Progressing

## 2020-01-14 NOTE — Consult Note (Addendum)
[facility] Face-to-Face Psychiatry Consult   Reason for Consult:  Suicidal ideations Referring Physician:  Dr. Lucianne Muss Patient Identification: [name] MRN:  [id number] Principal Diagnosis: Hypoglycemia Diagnosis:  Principal Problem:   Hypoglycemia Active Problems:   Diabetes mellitus type 1 (HCC)   HIV infection (HCC)   Depression   Syphilis   SIRS (systemic inflammatory response syndrome) (HCC)   Total Time spent with patient: 30 minutes  Subjective:   [name] is a 34 y.o. male patient admitted with hypoglycemia.  Patient with history of HIV, uncontrolled depression, insulin-dependent diabetes who presents today very withdrawn, despondent, and depressed.  Patient reports lifetime history of depression, with previous treatment failure of multiple medications.  Throughout the evaluation he is a poor historian and unable to recall much information to include previous diagnoses, medications, and historical psychiatric providers.  He does report been no medications have ever worked for him.  He endorses depressive symptoms that include sadness, isolation, worthlessness, anhedonia, recurrent thoughts of death, passive suicidal ideation, decreased appetite, and weight loss.  As a result of his depression.  His blood sugars uncontrolled, and he has poor management with his insulin.  He endorses ongoing passive suicidal ideations, however does not identify plan at this time.  He becomes very irritated and angered when discussing his current suicidality.  He verbalizes " once I have a plan and it is solid I will not tell anyone.  I do not like cutting myself because I do not like blood.  I do not like guns so I will be able to shoot myself.  But once I come up with 1 no one will know.  There is no point of getting better when you are going to feel this way the rest of your life."  Patient denies any previous suicide attempts. At this time he continues to endorse suicidal ideations with  no plan, and unable to contract for safety.   HPI:  This 34 yrs old male with medical history significant for HIV, depression, insulin-dependent diabetes presenting in the emergency department for the evaluation of abdominal discomfort,nausea,vomiting,diarrhea and low blood sugar. Patient has been drinking beer. He was found to be hypoglycemic. He was given glucagon but continued to be remain hypoglycemic. Patient was admitted for recurrent hypoglycemia,started on D10 W, Patient has withdrawal symptoms from alcohol presenting with shaking,sweating started on CIWA protocol.   Blood sugars improving.   During the evaluation he is alert and oriented, calm and grudgingly cooperative.  He is observed to be lying in bed with covers thrown over his head, he required multiple prompts in order to sit up and speak with Clinical research associate.  Patient would not engage in eye contact, was very withdrawn throughout the evaluation.  Patient with worsening depression, suicidal thoughts, recurrent thoughts of death, multiple chronic comorbidities at this time who presents as a high risk for suicide completion.  He endorses multiple depressive symptoms to include isolation, loneliness, and suicidal thoughts.  He does not appear to have much insight into his mental illness at this time, and does not wish to come inpatient.  Despite verbal encouragement and much motivation he continues to deny inpatient admission stating " things will never get better no way.  Why even try?" He also mentions his ability to elude inpatient treatment previously due to not being honest with his providers. "I have always had depression, and these thoughts. This is my first time opening up to someone and telling them the truth. I never talk about these  things, because I do not think it matters and it wont get better. " Patient with multiple risk factors for suicide completion will need inpatient.    Past Psychiatric History: Depression and anxiety.   Patient is a poor historian, and unable to identify previous psychiatrist and or previous medications.  He reports he has seen multiple psychiatrists, however has managed to elude inpatient admission.  He denies any previous inpatient admission.  He also denies any previous suicide attempts   Risk to Self:  Yes Risk to Others:  Denies Prior Inpatient Therapy:  Denies Prior Outpatient Therapy:  Yes unable to recall at this time.  Past Medical History:  Past Medical History:  Diagnosis Date  . Anxiety   . Asthma    no prior hospitalizations, intubations  . Depression   . Headache    "only w/stress" (11/01/2017)  . HIV (human immunodeficiency virus infection) (HCC)   . Migraine    "a few/month" (11/01/2017)  . Refusal of blood transfusions as patient is Jehovah's Witness   . Seizure (HCC)    "only w/my low blood sugars"  (11/01/2017)  . Type I diabetes mellitus (HCC)     Past Surgical History:  Procedure Laterality Date  . FRACTURE SURGERY    . IM NAILING TIBIA Right 11/01/2017   INTRAMEDULLARY (IM) NAIL TIBIALRightGeneral  . ORIF ANKLE FRACTURE Right 11/01/2017  . ORIF ANKLE FRACTURE Right 11/01/2017   Procedure: OPEN REDUCTION INTERNAL FIXATION (ORIF) ANKLE FRACTURE;  Surgeon: Roby Lofts, MD;  Location: [facility] OR;  Service: Orthopedics;  Laterality: Right;  . TIBIA IM NAIL INSERTION Right 11/01/2017   Procedure: INTRAMEDULLARY (IM) NAIL TIBIAL;  Surgeon: Roby Lofts, MD;  Location: [facility] OR;  Service: Orthopedics;  Laterality: Right;   Family History:  Family History  Problem Relation Age of Onset  . Thyroid disease Mother   . Diabetes Mother   . Breast cancer Maternal Grandmother   . Diabetes Maternal Grandmother    Family Psychiatric  History: Denies Social History:  Social History   Substance and Sexual Activity  Alcohol Use Yes  . Alcohol/week: 7.0 standard drinks  . Types: 7 Cans of beer per week     Social History   Substance and Sexual Activity  Drug Use Not  Currently  . Frequency: 2.0 times per week  . Types: Marijuana   Comment: 11/01/2017  "weekly"    Social History   Socioeconomic History  . Marital status: Single    Spouse name: Not on file  . Number of children: Not on file  . Years of education: Not on file  . Highest education level: Not on file  Occupational History  . Not on file  Tobacco Use  . Smoking status: Current Every Day Smoker    Types: Cigars    Start date: 09/29/2015  . Smokeless tobacco: Never Used  . Tobacco comment: 11/01/2017  "2 cigarettes in ~ 6 month "  Vaping Use  . Vaping Use: Never used  Substance and Sexual Activity  . Alcohol use: Yes    Alcohol/week: 7.0 standard drinks    Types: 7 Cans of beer per week  . Drug use: Not Currently    Frequency: 2.0 times per week    Types: Marijuana    Comment: 11/01/2017  "weekly"  . Sexual activity: Yes    Partners: Male    Comment: pt. declined condoms  Other Topics Concern  . Not on file  Social History Narrative  . Not on file  Social Determinants of Health   Financial Resource Strain:   . Difficulty of Paying Living Expenses: Not on file  Food Insecurity:   . Worried About Programme researcher, broadcasting/film/video in the Last Year: Not on file  . Ran Out of Food in the Last Year: Not on file  Transportation Needs:   . Lack of Transportation (Medical): Not on file  . Lack of Transportation (Non-Medical): Not on file  Physical Activity:   . Days of Exercise per Week: Not on file  . Minutes of Exercise per Session: Not on file  Stress:   . Feeling of Stress : Not on file  Social Connections:   . Frequency of Communication with Friends and Family: Not on file  . Frequency of Social Gatherings with Friends and Family: Not on file  . Attends Religious Services: Not on file  . Active Member of Clubs or Organizations: Not on file  . Attends Banker Meetings: Not on file  . Marital Status: Not on file   Additional Social History:    Allergies:   Allergies   Allergen Reactions  . Shellfish Allergy Anaphylaxis and Hives  . Aspirin Other (See Comments)    Unknown reaction    Labs:  Results for orders placed or performed during the hospital encounter of 01/12/20 (from the past 48 hour(s))  CBG monitoring, ED     Status: Abnormal   Collection Time: 01/12/20 11:27 PM  Result Value Ref Range   Glucose-Capillary 57 (L) 70 - 99 mg/dL    Comment: Glucose reference range applies only to samples taken after fasting for at least 8 hours.  CBG monitoring, ED     Status: Abnormal   Collection Time: 01/12/20 11:47 PM  Result Value Ref Range   Glucose-Capillary 33 (LL) 70 - 99 mg/dL    Comment: Glucose reference range applies only to samples taken after fasting for at least 8 hours.   Comment 1 Notify RN   CBG monitoring, ED     Status: Abnormal   Collection Time: 01/13/20 12:13 AM  Result Value Ref Range   Glucose-Capillary 114 (H) 70 - 99 mg/dL    Comment: Glucose reference range applies only to samples taken after fasting for at least 8 hours.  Comprehensive metabolic panel     Status: Abnormal   Collection Time: 01/13/20 12:22 AM  Result Value Ref Range   Sodium 139 135 - 145 mmol/L   Potassium 4.4 3.5 - 5.1 mmol/L   Chloride 99 98 - 111 mmol/L   CO2 28 22 - 32 mmol/L   Glucose, Bld 173 (H) 70 - 99 mg/dL    Comment: Glucose reference range applies only to samples taken after fasting for at least 8 hours.   BUN 18 6 - 20 mg/dL   Creatinine, Ser 9.60 0.61 - 1.24 mg/dL   Calcium 9.8 8.9 - 45.4 mg/dL   Total Protein 7.2 6.5 - 8.1 g/dL   Albumin 4.1 3.5 - 5.0 g/dL   AST 26 15 - 41 U/L   ALT 18 0 - 44 U/L   Alkaline Phosphatase 52 38 - 126 U/L   Total Bilirubin 1.0 0.3 - 1.2 mg/dL   GFR, Estimated >09 >81 mL/min    Comment: (NOTE) Calculated using the CKD-EPI Creatinine Equation (2021)    Anion gap 12 5 - 15    Comment: Performed at [facility], [address] [place], [place] [address]  Lipase, blood     Status:  None    Collection Time: 01/13/20 12:22 AM  Result Value Ref Range   Lipase 23 11 - 51 U/L    Comment: Performed at [facility], [address], [place], [place] [address]  CBC with Differential     Status: Abnormal   Collection Time: 01/13/20 12:22 AM  Result Value Ref Range   WBC 13.5 (H) 4.0 - 10.5 K/uL   RBC 4.97 4.22 - 5.81 MIL/uL   Hemoglobin 15.7 13.0 - 17.0 g/dL   HCT 60.4 39 - 52 %   MCV 93.0 80.0 - 100.0 fL   MCH 31.6 26.0 - 34.0 pg   MCHC 34.0 30.0 - 36.0 g/dL   RDW 54.0 98.1 - 19.1 %   Platelets 259 150 - 400 K/uL   nRBC 0.0 0.0 - 0.2 %   Neutrophils Relative % 90 %   Neutro Abs 12.1 (H) 1.7 - 7.7 K/uL   Lymphocytes Relative 4 %   Lymphs Abs 0.5 (L) 0.7 - 4.0 K/uL   Monocytes Relative 6 %   Monocytes Absolute 0.8 0.1 - 1.0 K/uL   Eosinophils Relative 0 %   Eosinophils Absolute 0.0 0.0 - 0.5 K/uL   Basophils Relative 0 %   Basophils Absolute 0.0 0.0 - 0.1 K/uL   Immature Granulocytes 0 %   Abs Immature Granulocytes 0.05 0.00 - 0.07 K/uL    Comment: Performed at [facility], [address], [place], [place] [address]  CBG monitoring, ED     Status: None   Collection Time: 01/13/20 12:55 AM  Result Value Ref Range   Glucose-Capillary 87 70 - 99 mg/dL    Comment: Glucose reference range applies only to samples taken after fasting for at least 8 hours.  CBG monitoring, ED     Status: Abnormal   Collection Time: 01/13/20  1:24 AM  Result Value Ref Range   Glucose-Capillary 58 (L) 70 - 99 mg/dL    Comment: Glucose reference range applies only to samples taken after fasting for at least 8 hours.  Respiratory Panel by RT PCR (Flu A&B, Covid) - Nasopharyngeal Swab     Status: None   Collection Time: 01/13/20  1:40 AM   Specimen: Nasopharyngeal Swab  Result Value Ref Range   SARS Coronavirus 2 by RT PCR NEGATIVE NEGATIVE    Comment: (NOTE) SARS-CoV-2 target nucleic acids are NOT DETECTED.  The SARS-CoV-2 RNA is generally detectable in upper respiratoy specimens  during the acute phase of infection. The lowest concentration of SARS-CoV-2 viral copies this assay can detect is 131 copies/mL. A negative result does not preclude SARS-Cov-2 infection and should not be used as the sole basis for treatment or other patient management decisions. A negative result may occur with  improper specimen collection/handling, submission of specimen other than nasopharyngeal swab, presence of viral mutation(s) within the areas targeted by this assay, and inadequate number of viral copies (<131 copies/mL). A negative result must be combined with clinical observations, patient history, and epidemiological information. The expected result is Negative.  Fact Sheet for Patients:  https://www.moore.com/  Fact Sheet for Healthcare Providers:  https://www.young.biz/  This test is no t yet approved or cleared by the [place] FDA and  has been authorized for detection and/or diagnosis of SARS-CoV-2 by FDA under an Emergency Use Authorization (EUA). This EUA will remain  in effect (meaning this test can be used) for the duration of the COVID-19 declaration under Section 564(b)(1) of the Act, 21 U.S.C. section  360bbb-3(b)(1), unless the authorization is terminated or revoked sooner.     Influenza A by PCR NEGATIVE NEGATIVE   Influenza B by PCR NEGATIVE NEGATIVE    Comment: (NOTE) The Xpert Xpress SARS-CoV-2/FLU/RSV assay is intended as an aid in  the diagnosis of influenza from Nasopharyngeal swab specimens and  should not be used as a sole basis for treatment. Nasal washings and  aspirates are unacceptable for Xpert Xpress SARS-CoV-2/FLU/RSV  testing.  Fact Sheet for Patients: https://www.moore.com/  Fact Sheet for Healthcare Providers: https://www.young.biz/  This test is not yet approved or cleared by the [place] FDA and  has been authorized for detection and/or diagnosis  of SARS-CoV-2 by  FDA under an Emergency Use Authorization (EUA). This EUA will remain  in effect (meaning this test can be used) for the duration of the  Covid-19 declaration under Section 564(b)(1) of the Act, 21  U.S.C. section 360bbb-3(b)(1), unless the authorization is  terminated or revoked. Performed at [facility], [address], [place], [place] [address]   Urinalysis, Routine w reflex microscopic Urine, Clean Catch     Status: Abnormal   Collection Time: 01/13/20  1:43 AM  Result Value Ref Range   Color, Urine YELLOW YELLOW   APPearance CLEAR CLEAR   Specific Gravity, Urine 1.026 1.005 - 1.030   pH 6.0 5.0 - 8.0   Glucose, UA 150 (A) NEGATIVE mg/dL   Hgb urine dipstick NEGATIVE NEGATIVE   Bilirubin Urine NEGATIVE NEGATIVE   Ketones, ur 20 (A) NEGATIVE mg/dL   Protein, ur >=081 (A) NEGATIVE mg/dL   Nitrite NEGATIVE NEGATIVE   Leukocytes,Ua NEGATIVE NEGATIVE   RBC / HPF 6-10 0 - 5 RBC/hpf   WBC, UA 0-5 0 - 5 WBC/hpf   Bacteria, UA NONE SEEN NONE SEEN   Mucus PRESENT     Comment: Performed at [facility], [address], [place], [place] [address]  CBG monitoring, ED     Status: Abnormal   Collection Time: 01/13/20  1:59 AM  Result Value Ref Range   Glucose-Capillary 68 (L) 70 - 99 mg/dL    Comment: Glucose reference range applies only to samples taken after fasting for at least 8 hours.  CBG monitoring, ED     Status: Abnormal   Collection Time: 01/13/20  3:18 AM  Result Value Ref Range   Glucose-Capillary 67 (L) 70 - 99 mg/dL    Comment: Glucose reference range applies only to samples taken after fasting for at least 8 hours.  CBC WITH DIFFERENTIAL     Status: Abnormal   Collection Time: 01/13/20  3:48 AM  Result Value Ref Range   WBC 11.3 (H) 4.0 - 10.5 K/uL   RBC 4.79 4.22 - 5.81 MIL/uL   Hemoglobin 15.5 13.0 - 17.0 g/dL   HCT 56.3 39 - 52 %   MCV 92.5 80.0 - 100.0 fL   MCH 32.4 26.0 - 34.0 pg   MCHC 35.0 30.0 - 36.0 g/dL   RDW 14.9 70.2 -  63.7 %   Platelets 263 150 - 400 K/uL   nRBC 0.0 0.0 - 0.2 %   Neutrophils Relative % 86 %   Neutro Abs 9.8 (H) 1.7 - 7.7 K/uL   Lymphocytes Relative 8 %   Lymphs Abs 0.9 0.7 - 4.0 K/uL   Monocytes Relative 5 %   Monocytes Absolute 0.5 0.1 - 1.0 K/uL   Eosinophils Relative 0 %   Eosinophils Absolute 0.0 0.0 - 0.5 K/uL  Basophils Relative 0 %   Basophils Absolute 0.0 0.0 - 0.1 K/uL   Immature Granulocytes 1 %   Abs Immature Granulocytes 0.06 0.00 - 0.07 K/uL    Comment: Performed at [facility], [address] [place], [place] [address]  Basic metabolic panel     Status: Abnormal   Collection Time: 01/13/20  3:48 AM  Result Value Ref Range   Sodium 139 135 - 145 mmol/L   Potassium 4.4 3.5 - 5.1 mmol/L   Chloride 102 98 - 111 mmol/L   CO2 26 22 - 32 mmol/L   Glucose, Bld 55 (L) 70 - 99 mg/dL    Comment: Glucose reference range applies only to samples taken after fasting for at least 8 hours.   BUN 17 6 - 20 mg/dL   Creatinine, Ser 6.041.02 0.61 - 1.24 mg/dL   Calcium 9.9 8.9 - 54.010.3 mg/dL   GFR, Estimated >98>60 >11>60 mL/min    Comment: (NOTE) Calculated using the CKD-EPI Creatinine Equation (2021)    Anion gap 11 5 - 15    Comment: Performed at [facility], [address] [place], [place] [address]  CBG monitoring, ED     Status: Abnormal   Collection Time: 01/13/20  4:28 AM  Result Value Ref Range   Glucose-Capillary 60 (L) 70 - 99 mg/dL    Comment: Glucose reference range applies only to samples taken after fasting for at least 8 hours.  CBG monitoring, ED     Status: Abnormal   Collection Time: 01/13/20  5:16 AM  Result Value Ref Range   Glucose-Capillary 36 (LL) 70 - 99 mg/dL    Comment: Glucose reference range applies only to samples taken after fasting for at least 8 hours.   Comment 1 Notify RN   CBG monitoring, ED     Status: Abnormal   Collection Time: 01/13/20  5:57 AM  Result Value Ref Range   Glucose-Capillary 121 (H) 70 - 99 mg/dL    Comment:  Glucose reference range applies only to samples taken after fasting for at least 8 hours.  CBG monitoring, ED     Status: Abnormal   Collection Time: 01/13/20  6:26 AM  Result Value Ref Range   Glucose-Capillary 134 (H) 70 - 99 mg/dL    Comment: Glucose reference range applies only to samples taken after fasting for at least 8 hours.  CBG monitoring, ED     Status: Abnormal   Collection Time: 01/13/20  7:43 AM  Result Value Ref Range   Glucose-Capillary 124 (H) 70 - 99 mg/dL    Comment: Glucose reference range applies only to samples taken after fasting for at least 8 hours.  Comprehensive metabolic panel     Status: Abnormal   Collection Time: 01/13/20  9:28 AM  Result Value Ref Range   Sodium 136 135 - 145 mmol/L   Potassium 3.6 3.5 - 5.1 mmol/L   Chloride 99 98 - 111 mmol/L   CO2 26 22 - 32 mmol/L   Glucose, Bld 131 (H) 70 - 99 mg/dL    Comment: Glucose reference range applies only to samples taken after fasting for at least 8 hours.   BUN 13 6 - 20 mg/dL   Creatinine, Ser 2.951.22 0.61 - 1.24 mg/dL   Calcium 9.4 8.9 - 62.110.3 mg/dL   Total Protein 6.5 6.5 - 8.1 g/dL   Albumin 3.6 3.5 - 5.0 g/dL   AST 25 15 - 41 U/L   ALT  16 0 - 44 U/L   Alkaline Phosphatase 53 38 - 126 U/L   Total Bilirubin 1.1 0.3 - 1.2 mg/dL   GFR, Estimated >96 >04 mL/min    Comment: (NOTE) Calculated using the CKD-EPI Creatinine Equation (2021)    Anion gap 11 5 - 15    Comment: Performed at [facility], [address] [place], [place] [address]  Magnesium     Status: None   Collection Time: 01/13/20  9:28 AM  Result Value Ref Range   Magnesium 1.8 1.7 - 2.4 mg/dL    Comment: Performed at [facility], [address] [place], [place] [address]  Phosphorus     Status: None   Collection Time: 01/13/20  9:28 AM  Result Value Ref Range   Phosphorus 4.1 2.5 - 4.6 mg/dL    Comment: Performed at [facility], [address], [place], [place] [address]  CBC     Status: None   Collection  Time: 01/13/20  9:28 AM  Result Value Ref Range   WBC 9.2 4.0 - 10.5 K/uL   RBC 4.66 4.22 - 5.81 MIL/uL   Hemoglobin 14.9 13.0 - 17.0 g/dL   HCT 62.1 39 - 52 %   MCV 93.8 80.0 - 100.0 fL   MCH 32.0 26.0 - 34.0 pg   MCHC 34.1 30.0 - 36.0 g/dL   RDW 30.8 65.7 - 84.6 %   Platelets 266 150 - 400 K/uL   nRBC 0.0 0.0 - 0.2 %    Comment: Performed at [facility], [address] [place], [place] [address]  CBG monitoring, ED     Status: Abnormal   Collection Time: 01/13/20  9:44 AM  Result Value Ref Range   Glucose-Capillary 102 (H) 70 - 99 mg/dL    Comment: Glucose reference range applies only to samples taken after fasting for at least 8 hours.   Comment 1 Notify RN   CBG monitoring, ED     Status: Abnormal   Collection Time: 01/13/20 11:09 AM  Result Value Ref Range   Glucose-Capillary 171 (H) 70 - 99 mg/dL    Comment: Glucose reference range applies only to samples taken after fasting for at least 8 hours.   Comment 1 Notify RN   CBG monitoring, ED     Status: Abnormal   Collection Time: 01/13/20  1:10 PM  Result Value Ref Range   Glucose-Capillary 172 (H) 70 - 99 mg/dL    Comment: Glucose reference range applies only to samples taken after fasting for at least 8 hours.  CBG monitoring, ED     Status: Abnormal   Collection Time: 01/13/20  4:20 PM  Result Value Ref Range   Glucose-Capillary 215 (H) 70 - 99 mg/dL    Comment: Glucose reference range applies only to samples taken after fasting for at least 8 hours.   Comment 1 Document in Chart   Glucose, capillary     Status: Abnormal   Collection Time: 01/13/20  5:37 PM  Result Value Ref Range   Glucose-Capillary 210 (H) 70 - 99 mg/dL    Comment: Glucose reference range applies only to samples taken after fasting for at least 8 hours.  MRSA PCR Screening     Status: None   Collection Time: 01/13/20  5:53 PM   Specimen: Nasal Mucosa; Nasopharyngeal  Result Value Ref Range   MRSA by PCR NEGATIVE NEGATIVE    Comment:  The GeneXpert MRSA Assay (FDA approved for NASAL specimens only), is one component of a comprehensive MRSA colonization surveillance program. It is not intended to diagnose MRSA infection nor to guide or monitor treatment for MRSA infections. Performed at [facility], [address] [place], [place] [address]   Glucose, capillary     Status: Abnormal   Collection Time: 01/13/20  6:29 PM  Result Value Ref Range   Glucose-Capillary 206 (H) 70 - 99 mg/dL    Comment: Glucose reference range applies only to samples taken after fasting for at least 8 hours.  Glucose, capillary     Status: Abnormal   Collection Time: 01/13/20  8:28 PM  Result Value Ref Range   Glucose-Capillary 331 (H) 70 - 99 mg/dL    Comment: Glucose reference range applies only to samples taken after fasting for at least 8 hours.  Glucose, capillary     Status: Abnormal   Collection Time: 01/13/20 11:15 PM  Result Value Ref Range   Glucose-Capillary 253 (H) 70 - 99 mg/dL    Comment: Glucose reference range applies only to samples taken after fasting for at least 8 hours.  Glucose, capillary     Status: Abnormal   Collection Time: 01/14/20  3:09 AM  Result Value Ref Range   Glucose-Capillary 181 (H) 70 - 99 mg/dL    Comment: Glucose reference range applies only to samples taken after fasting for at least 8 hours.  CBC     Status: None   Collection Time: 01/14/20  3:31 AM  Result Value Ref Range   WBC 7.1 4.0 - 10.5 K/uL   RBC 4.94 4.22 - 5.81 MIL/uL   Hemoglobin 15.8 13.0 - 17.0 g/dL   HCT 05.3 39 - 52 %   MCV 91.9 80.0 - 100.0 fL   MCH 32.0 26.0 - 34.0 pg   MCHC 34.8 30.0 - 36.0 g/dL   RDW 97.6 73.4 - 19.3 %   Platelets 265 150 - 400 K/uL   nRBC 0.0 0.0 - 0.2 %    Comment: Performed at [facility], [address], [place], [place] [address]  Magnesium     Status: None   Collection Time: 01/14/20  3:31 AM  Result Value Ref Range   Magnesium 1.8 1.7 - 2.4 mg/dL    Comment: Performed at  [facility], [address] [place], [place] [address]  Phosphorus     Status: None   Collection Time: 01/14/20  3:31 AM  Result Value Ref Range   Phosphorus 3.4 2.5 - 4.6 mg/dL    Comment: Performed at [facility], [address] [place], [place] [address]  Comprehensive metabolic panel     Status: Abnormal   Collection Time: 01/14/20  3:31 AM  Result Value Ref Range   Sodium 136 135 - 145 mmol/L   Potassium 4.0 3.5 - 5.1 mmol/L   Chloride 101 98 - 111 mmol/L   CO2 26 22 - 32 mmol/L   Glucose, Bld 157 (H) 70 - 99 mg/dL    Comment: Glucose reference range applies only to samples taken after fasting for at least 8 hours.   BUN 16 6 - 20 mg/dL   Creatinine, Ser 4.26 (H) 0.61 - 1.24 mg/dL   Calcium 9.4 8.9 - 83.4 mg/dL   Total Protein 6.7 6.5 - 8.1 g/dL   Albumin 3.5 3.5 - 5.0 g/dL   AST 20 15 - 41 U/L   ALT 18  0 - 44 U/L   Alkaline Phosphatase 52 38 - 126 U/L   Total Bilirubin 0.8 0.3 - 1.2 mg/dL   GFR, Estimated >11 >91 mL/min    Comment: (NOTE) Calculated using the CKD-EPI Creatinine Equation (2021)    Anion gap 9 5 - 15    Comment: Performed at [facility], [address], [place], [place] [address]  Glucose, capillary     Status: Abnormal   Collection Time: 01/14/20  7:36 AM  Result Value Ref Range   Glucose-Capillary 237 (H) 70 - 99 mg/dL    Comment: Glucose reference range applies only to samples taken after fasting for at least 8 hours.  Glucose, capillary     Status: Abnormal   Collection Time: 01/14/20  9:50 AM  Result Value Ref Range   Glucose-Capillary 264 (H) 70 - 99 mg/dL    Comment: Glucose reference range applies only to samples taken after fasting for at least 8 hours.  Glucose, capillary     Status: Abnormal   Collection Time: 01/14/20 11:46 AM  Result Value Ref Range   Glucose-Capillary 266 (H) 70 - 99 mg/dL    Comment: Glucose reference range applies only to samples taken after fasting for at least 8 hours.    Current  Facility-Administered Medications  Medication Dose Route Frequency Provider Last Rate Last Admin  . acetaminophen (TYLENOL) tablet 650 mg  650 mg Oral Q6H PRN Opyd, Lavone Neri, MD   650 mg at 01/13/20 1849   Or  . acetaminophen (TYLENOL) suppository 650 mg  650 mg Rectal Q6H PRN Opyd, Lavone Neri, MD      . citalopram (CELEXA) tablet 20 mg  20 mg Oral Daily Opyd, Lavone Neri, MD   20 mg at 01/14/20 0759  . dextrose 10 % infusion   Intravenous Continuous Opyd, Lavone Neri, MD   Paused at 01/13/20 1620  . elvitegravir-cobicistat-emtricitabine-tenofovir (GENVOYA) 150-150-200-10 MG tablet 1 tablet  1 tablet Oral Q breakfast Opyd, Lavone Neri, MD   1 tablet at 01/14/20 0759  . enoxaparin (LOVENOX) injection 40 mg  40 mg Subcutaneous Q24H Opyd, Lavone Neri, MD   40 mg at 01/14/20 1328  . folic acid (FOLVITE) tablet 1 mg  1 mg Oral Daily Cipriano Bunker, MD   1 mg at 01/14/20 0800  . hydrALAZINE (APRESOLINE) tablet 10 mg  10 mg Oral Q6H PRN Cipriano Bunker, MD   10 mg at 01/14/20 0955  . insulin aspart (novoLOG) injection 0-7 Units  0-7 Units Subcutaneous Q4H Opyd, Lavone Neri, MD   3 Units at 01/14/20 1204  . LORazepam (ATIVAN) tablet 1-4 mg  1-4 mg Oral Q1H PRN Cipriano Bunker, MD       Or  . LORazepam (ATIVAN) injection 1-4 mg  1-4 mg Intravenous Q1H PRN Cipriano Bunker, MD   3 mg at 01/13/20 0939  . multivitamin with minerals tablet 1 tablet  1 tablet Oral Daily Cipriano Bunker, MD   1 tablet at 01/14/20 0800  . ondansetron (ZOFRAN) tablet 4 mg  4 mg Oral Q6H PRN Opyd, Lavone Neri, MD       Or  . ondansetron (ZOFRAN) injection 4 mg  4 mg Intravenous Q6H PRN Opyd, Lavone Neri, MD   4 mg at 01/14/20 0956  . thiamine tablet 100 mg  100 mg Oral Daily Cipriano Bunker, MD   100 mg at 01/14/20 0800   Or  . thiamine (B-1) injection 100 mg  100 mg Intravenous Daily Cipriano Bunker, MD  Musculoskeletal: Strength & Muscle Tone: within normal limits Gait & Station: normal Patient leans: N/A  Psychiatric Specialty  Exam: Physical Exam Vitals and nursing note reviewed.  Constitutional:      Appearance: Normal appearance. He is normal weight.  Neurological:     Mental Status: He is alert.  Psychiatric:     Comments: Sad, depressed, despondent, withdrawn, and isolated. Back turned to writer, no eye contact. Poor judgment and minimal insight.      Review of Systems  Constitutional: Positive for appetite change and unexpected weight change.  Neurological: Negative for weakness.  Psychiatric/Behavioral: Positive for decreased concentration, dysphoric mood, sleep disturbance and suicidal ideas. The patient is nervous/anxious.   All other systems reviewed and are negative.   Blood pressure (!) 164/104, pulse 91, temperature 98.3 F (36.8 C), temperature source Oral, resp. rate 19, SpO2 100 %.There is no height or weight on file to calculate BMI.  General Appearance: Guarded  Eye Contact:  Poor  Speech:  Slow  Volume:  Decreased  Mood:  Depressed, Dysphoric, Hopeless and Worthless  Affect:  Depressed, Flat and Restricted  Thought Process:  Coherent, Linear and Descriptions of Associations: Intact  Orientation:  Full (Time, Place, and Person)  Thought Content:  Logical  Suicidal Thoughts:  Yes.  without intent/plan  Homicidal Thoughts:  No  Memory:  Immediate;   Fair Recent;   Poor Remote;   Poor  Judgement:  Poor  Insight:  Shallow  Psychomotor Activity:  Psychomotor Retardation  Concentration:  Concentration: Fair and Attention Span: Fair  Recall:  [employer] of Knowledge:  Fair  Language:  Fair  Akathisia:  No  Handed:  Right  AIMS (if indicated):     Assets:  Communication Skills Desire for Improvement Financial Resources/Insurance Physical Health Resilience Social Support  ADL's:  Intact  Cognition:  WNL  Sleep:        Treatment Plan Summary: Plan Will recommend inpatient once patient is medically clear.  Patient will benefit from initiation of psychotropic medication to  augment depression medication. Will obtain EKG to check for QTc prolongation. Will increase citalopram  po daily for depression.  Patient is currently unable to contract for safety, he does not exhibit any immediate signs of imminent risk for suicide. Will recommend safety sitter at this time. If patient does not wish to come voluntarily, who will need to be placed under IVC at this time.   Start citalopram  po daily for depression.  Initiate 1:1 safety sitter -IVC patient if he does not wish to come into the hospital voluntarily.  -Will obtain EKG to check for QTc prolongation.   Disposition: Recommend psychiatric Inpatient admission when medically cleared. -Recommend working closely with social work for placement in inpatient facility.  -Psychiatry to sign off at this time, please reconsult if needed.  Maryagnes Amos, FNP 01/14/2020 3:25 PM

## 2020-01-14 NOTE — Progress Notes (Signed)
PROGRESS NOTE    [name]  VOZ:[id number] DOB: October 21, 1985 DOA: 01/12/2020 PCP: Patient, No Pcp Per   Brief Narrative:  This 34 yrs old male with medical history significant for HIV, depression, insulin-dependent diabetes presenting in the emergency department for the evaluation of abdominal discomfort, nausea, vomiting, diarrhea and low blood sugar.  Patient has been drinking beer.  He was found to be hypoglycemic.  He was given glucagon but continued to be remain hypoglycemic.  Patient was admitted for recurrent hypoglycemia,  started on D10 W, Patient has withdrawal symptoms from alcohol presenting with shaking,  sweating started on CIWA protocol.    Blood sugars improving.  Assessment & Plan:   Principal Problem:   Hypoglycemia Active Problems:   Diabetes mellitus type 1 (HCC)   HIV infection (HCC)   Depression   Syphilis   SIRS (systemic inflammatory response syndrome) (HCC)    Hypoglycemia; insulin-dependent DM  - Patient with IDDM, A1c 7.8% in October, presents with recurrent hypoglycemia in setting of taking insulin but not eating and has had recurrent hypoglycemic episodes in ED despite IV dextrose and encouragement to eat and drink. -Continued 10% dextrose infusion  with frequent CBGs, encouraged eating/drinking  -Fingersticks improved,  patient started eating, D5 water discontinued.  2. HIV  - CD4 was 641 and VL <20 in September  - Syphilis was treated, titers followed by ID   - Continue Genvoya    3. Depression, anxiety  - Continue Celexa  -Patient reports severe depression with intermittent thoughts about hurting himself. Denies suicidal ideations. -Stat psych consulted will follow up recommendation.  4. SIRS  - Patient meets SIRS criteria with leukocytosis and mild tachycardia  - He is afebrile, reports N/V/D but has benign abdominal exam and no urinary sxs, no respiratory sxs, and no meningismus or apparent skin infection  - Mild tachycardia  resolved with IVF and leukocytosis likely reactive though C diff possible   - Patient remained afebrile, SIRS criteria resolved.  5.  Alcohol abuse: Patient had withdrawal symptoms yesterday started on CIWA protocol.  DVT prophylaxis: Lovenox Code Status: Full code. Family Communication:  No family at bed side. Disposition Plan:  Status is: Inpatient  Remains inpatient appropriate because:Inpatient level of care appropriate due to severity of illness   Dispo: The patient is from: Home              Anticipated d/c is to: Home              Anticipated d/c date is: 1 day              Patient currently is not medically stable to d/c.   Consultants:   Psychiatry  Procedures:  Antimicrobials:  Anti-infectives (From admission, onward)   Start     Dose/Rate Route Frequency Ordered Stop   01/13/20 0800  elvitegravir-cobicistat-emtricitabine-tenofovir (GENVOYA) 150-150-200-10 MG tablet 1 tablet        1 tablet Oral Daily with breakfast 01/13/20 0201        Subjective: Patient was seen and examined at bedside.  He appears very depressed , overnight events noted.   Patient denies any nausea, vomiting and diarrhea.  Abdominal pain has improved.  Patient denies any withdrawal symptoms at this point.  Objective: Vitals:   01/14/20 0310 01/14/20 0741 01/14/20 0955 01/14/20 1149  BP: (!) 161/96 (!) 157/71 (!) 178/103 (!) 164/104  Pulse: 78 85  91  Resp: 16 19  19   Temp: 98 F (36.7 C) 98.5 F (36.9  C)  98.3 F (36.8 C)  TempSrc: Oral Oral  Oral  SpO2: 100% 100%  100%    Intake/Output Summary (Last 24 hours) at 01/14/2020 1327 Last data filed at 01/14/2020 0313 Gross per 24 hour  Intake 350 ml  Output --  Net 350 ml   There were no vitals filed for this visit.  Examination:  General exam: Appears calm and comfortable, appears very depressed Respiratory system: Clear to auscultation. Respiratory effort normal. Cardiovascular system: S1 & S2 heard, RRR. No JVD, murmurs,  rubs, gallops or clicks. No pedal edema. Gastrointestinal system: Abdomen is nondistended, soft and nontender. No organomegaly or masses felt. Normal bowel sounds heard. Central nervous system: Alert and oriented. No focal neurological deficits. Extremities: No edema, no cyanosis, no clubbing. Skin: No rashes, lesions or ulcers Psychiatry: Judgement and insight appear normal. Mood: Depressed. affect : Flat    Data Reviewed: I have personally reviewed following labs and imaging studies  CBC: Recent Labs  Lab 01/13/20 0022 01/13/20 0348 01/13/20 0928 01/14/20 0331  WBC 13.5* 11.3* 9.2 7.1  NEUTROABS 12.1* 9.8*  --   --   HGB 15.7 15.5 14.9 15.8  HCT 46.2 44.3 43.7 45.4  MCV 93.0 92.5 93.8 91.9  PLT 259 263 266 265   Basic Metabolic Panel: Recent Labs  Lab 01/13/20 0022 01/13/20 0348 01/13/20 0928 01/14/20 0331  NA 139 139 136 136  K 4.4 4.4 3.6 4.0  CL 99 102 99 101  CO2 28 26 26 26   GLUCOSE 173* 55* 131* 157*  BUN 18 17 13 16   CREATININE 1.08 1.02 1.22 1.36*  CALCIUM 9.8 9.9 9.4 9.4  MG  --   --  1.8 1.8  PHOS  --   --  4.1 3.4   GFR: CrCl cannot be calculated (Unknown ideal weight.). Liver Function Tests: Recent Labs  Lab 01/13/20 0022 01/13/20 0928 01/14/20 0331  AST 26 25 20   ALT 18 16 18   ALKPHOS 52 53 52  BILITOT 1.0 1.1 0.8  PROT 7.2 6.5 6.7  ALBUMIN 4.1 3.6 3.5   Recent Labs  Lab 01/13/20 0022  LIPASE 23   No results for input(s): AMMONIA in the last 168 hours. Coagulation Profile: No results for input(s): INR, PROTIME in the last 168 hours. Cardiac Enzymes: No results for input(s): CKTOTAL, CKMB, CKMBINDEX, TROPONINI in the last 168 hours. BNP (last 3 results) No results for input(s): PROBNP in the last 8760 hours. HbA1C: No results for input(s): HGBA1C in the last 72 hours. CBG: Recent Labs  Lab 01/13/20 2315 01/14/20 0309 01/14/20 0736 01/14/20 0950 01/14/20 1146  GLUCAP 253* 181* 237* 264* 266*   Lipid Profile: No results  for input(s): CHOL, HDL, LDLCALC, TRIG, CHOLHDL, LDLDIRECT in the last 72 hours. Thyroid Function Tests: No results for input(s): TSH, T4TOTAL, FREET4, T3FREE, THYROIDAB in the last 72 hours. Anemia Panel: No results for input(s): VITAMINB12, FOLATE, FERRITIN, TIBC, IRON, RETICCTPCT in the last 72 hours. Sepsis Labs: No results for input(s): PROCALCITON, LATICACIDVEN in the last 168 hours.  Recent Results (from the past 240 hour(s))  Respiratory Panel by RT PCR (Flu A&B, Covid) - Nasopharyngeal Swab     Status: None   Collection Time: 01/13/20  1:40 AM   Specimen: Nasopharyngeal Swab  Result Value Ref Range Status   SARS Coronavirus 2 by RT PCR NEGATIVE NEGATIVE Final    Comment: (NOTE) SARS-CoV-2 target nucleic acids are NOT DETECTED.  The SARS-CoV-2 RNA is generally detectable in upper respiratoy specimens during  the acute phase of infection. The lowest concentration of SARS-CoV-2 viral copies this assay can detect is 131 copies/mL. A negative result does not preclude SARS-Cov-2 infection and should not be used as the sole basis for treatment or other patient management decisions. A negative result may occur with  improper specimen collection/handling, submission of specimen other than nasopharyngeal swab, presence of viral mutation(s) within the areas targeted by this assay, and inadequate number of viral copies (<131 copies/mL). A negative result must be combined with clinical observations, patient history, and epidemiological information. The expected result is Negative.  Fact Sheet for Patients:  https://www.moore.com/  Fact Sheet for Healthcare Providers:  https://www.young.biz/  This test is no t yet approved or cleared by the [place] FDA and  has been authorized for detection and/or diagnosis of SARS-CoV-2 by FDA under an Emergency Use Authorization (EUA). This EUA will remain  in effect (meaning this test can be used) for  the duration of the COVID-19 declaration under Section 564(b)(1) of the Act, 21 U.S.C. section 360bbb-3(b)(1), unless the authorization is terminated or revoked sooner.     Influenza A by PCR NEGATIVE NEGATIVE Final   Influenza B by PCR NEGATIVE NEGATIVE Final    Comment: (NOTE) The Xpert Xpress SARS-CoV-2/FLU/RSV assay is intended as an aid in  the diagnosis of influenza from Nasopharyngeal swab specimens and  should not be used as a sole basis for treatment. Nasal washings and  aspirates are unacceptable for Xpert Xpress SARS-CoV-2/FLU/RSV  testing.  Fact Sheet for Patients: https://www.moore.com/  Fact Sheet for Healthcare Providers: https://www.young.biz/  This test is not yet approved or cleared by the [place] FDA and  has been authorized for detection and/or diagnosis of SARS-CoV-2 by  FDA under an Emergency Use Authorization (EUA). This EUA will remain  in effect (meaning this test can be used) for the duration of the  Covid-19 declaration under Section 564(b)(1) of the Act, 21  U.S.C. section 360bbb-3(b)(1), unless the authorization is  terminated or revoked. Performed at [facility], [address], [place], [place] [address]   MRSA PCR Screening     Status: None   Collection Time: 01/13/20  5:53 PM   Specimen: Nasal Mucosa; Nasopharyngeal  Result Value Ref Range Status   MRSA by PCR NEGATIVE NEGATIVE Final    Comment:        The GeneXpert MRSA Assay (FDA approved for NASAL specimens only), is one component of a comprehensive MRSA colonization surveillance program. It is not intended to diagnose MRSA infection nor to guide or monitor treatment for MRSA infections. Performed at [facility], [address], [place], [place] [address]      Radiology Studies: No results found.   Scheduled Meds: . citalopram  20 mg Oral Daily  . elvitegravir-cobicistat-emtricitabine-tenofovir  1 tablet Oral Q  breakfast  . enoxaparin (LOVENOX) injection  40 mg Subcutaneous Q24H  . folic acid  1 mg Oral Daily  . insulin aspart  0-7 Units Subcutaneous Q4H  . multivitamin with minerals  1 tablet Oral Daily  . thiamine  100 mg Oral Daily   Or  . thiamine  100 mg Intravenous Daily   Continuous Infusions: . dextrose Stopped (01/13/20 1620)     LOS: 1 day    Time spent:  25 mins    Eleonore Shippee, MD [facility]   If 7PM-7AM, please contact night-coverage

## 2020-01-14 NOTE — Progress Notes (Signed)
   01/14/20 1615  Clinical Encounter Type  Visited With Patient  Visit Type Spiritual support  Referral From Nurse  Consult/Referral To Chaplain  Chaplain responded to the consult. Chaplain spoke with patient who is Jehov[employer]'s Witness. Advised the patient I would contact someone from church tradition to see if they can visit. This note was prepared by Deneen Harts, M.Div..  For questions please contact by phone [phone].

## 2020-01-15 ENCOUNTER — Inpatient Hospital Stay (HOSPITAL_COMMUNITY)
Admission: AD | Admit: 2020-01-15 | Discharge: 2020-01-16 | DRG: 881 | Disposition: A | Discharge status: Home / Self Care | Payer: Federal, State, Local not specified - Other | Source: Intra-hospital | Attending: Psychiatry | Admitting: Psychiatry

## 2020-01-15 DIAGNOSIS — F32A Depression, unspecified: Secondary | ICD-10-CM | POA: Diagnosis present

## 2020-01-15 DIAGNOSIS — F332 Major depressive disorder, recurrent severe without psychotic features: Secondary | ICD-10-CM | POA: Diagnosis present

## 2020-01-15 LAB — BASIC METABOLIC PANEL WITH GFR
Anion gap: 8 (ref 5–15)
BUN: 19 mg/dL (ref 6–20)
CO2: 26 mmol/L (ref 22–32)
Calcium: 9.6 mg/dL (ref 8.9–10.3)
Chloride: 101 mmol/L (ref 98–111)
Creatinine, Ser: 1.46 mg/dL — ABNORMAL HIGH (ref 0.61–1.24)
GFR, Estimated: >60 mL/min
Glucose, Bld: 235 mg/dL — ABNORMAL HIGH (ref 70–99)
Potassium: 4.3 mmol/L (ref 3.5–5.1)
Sodium: 135 mmol/L (ref 135–145)

## 2020-01-15 LAB — GLUCOSE, CAPILLARY
Glucose-Capillary: 233 mg/dL — ABNORMAL HIGH (ref 70–99)
Glucose-Capillary: 251 mg/dL — ABNORMAL HIGH (ref 70–99)
Glucose-Capillary: 305 mg/dL — ABNORMAL HIGH (ref 70–99)

## 2020-01-15 MED ORDER — LORAZEPAM 1 MG PO TABS: 1.0000 mg | ORAL_TABLET | Freq: Four times a day (QID) | ORAL | Status: DC | PRN

## 2020-01-15 MED ORDER — LORAZEPAM 1 MG PO TABS: 1.0000 mg | ORAL_TABLET | ORAL | Status: DC | PRN

## 2020-01-15 MED ORDER — INSULIN GLARGINE 100 UNIT/ML ~~LOC~~ SOLN: 20.0000 [IU] | Freq: Every day | SUBCUTANEOUS | Status: DC

## 2020-01-15 MED ORDER — INSULIN ASPART 100 UNIT/ML FLEXPEN: 5.0000 [IU] | PEN_INJECTOR | Freq: Three times a day (TID) | SUBCUTANEOUS | 1 refills | Status: DC

## 2020-01-15 MED ORDER — HYDROXYZINE HCL 25 MG PO TABS: 25.0000 mg | ORAL_TABLET | Freq: Three times a day (TID) | ORAL | Status: DC | PRN

## 2020-01-15 MED ORDER — LORAZEPAM 2 MG/ML IJ SOLN: 1.0000 mg | INTRAMUSCULAR | Status: DC | PRN

## 2020-01-15 MED ORDER — CLONIDINE HCL 0.1 MG PO TABS: 0.1000 mg | ORAL_TABLET | Freq: Three times a day (TID) | ORAL | Status: DC | PRN

## 2020-01-15 MED ORDER — ADULT MULTIVITAMIN W/MINERALS CH
1.0000 | ORAL_TABLET | Freq: Every day | ORAL | Status: DC
  Filled 2020-01-15: qty 1

## 2020-01-15 MED ORDER — ACETAMINOPHEN 650 MG RE SUPP: 650.0000 mg | Freq: Four times a day (QID) | RECTAL | Status: DC | PRN

## 2020-01-15 MED ORDER — CITALOPRAM HYDROBROMIDE 20 MG PO TABS
30.0000 mg | ORAL_TABLET | Freq: Every day | ORAL | Status: DC
  Filled 2020-01-15: qty 1

## 2020-01-15 MED ORDER — AMLODIPINE BESYLATE 5 MG PO TABS
5.0000 mg | ORAL_TABLET | Freq: Every day | ORAL | Status: DC
  Filled 2020-01-15 (×2): qty 1

## 2020-01-15 MED ORDER — OLANZAPINE 5 MG PO TBDP: 5.0000 mg | ORAL_TABLET | Freq: Three times a day (TID) | ORAL | Status: DC | PRN

## 2020-01-15 MED ORDER — INSULIN ASPART 100 UNIT/ML ~~LOC~~ SOLN: 0.0000 [IU] | Freq: Three times a day (TID) | SUBCUTANEOUS | Status: DC

## 2020-01-15 MED ORDER — ZIPRASIDONE MESYLATE 20 MG IM SOLR: 20.0000 mg | Freq: Four times a day (QID) | INTRAMUSCULAR | Status: DC | PRN

## 2020-01-15 MED ORDER — ACETAMINOPHEN 325 MG PO TABS: 650.0000 mg | ORAL_TABLET | Freq: Four times a day (QID) | ORAL | Status: DC | PRN

## 2020-01-15 MED ORDER — ONDANSETRON HCL 4 MG/2ML IJ SOLN: 4.0000 mg | Freq: Four times a day (QID) | INTRAMUSCULAR | Status: DC | PRN

## 2020-01-15 MED ORDER — MAGNESIUM HYDROXIDE 400 MG/5ML PO SUSP: 30.0000 mL | Freq: Every day | ORAL | Status: DC | PRN

## 2020-01-15 MED ORDER — INSULIN ASPART 100 UNIT/ML ~~LOC~~ SOLN: 0.0000 [IU] | SUBCUTANEOUS | Status: DC

## 2020-01-15 MED ORDER — THIAMINE HCL 100 MG PO TABS
100.0000 mg | ORAL_TABLET | Freq: Every day | ORAL | Status: DC
  Filled 2020-01-15: qty 1

## 2020-01-15 MED ORDER — CITALOPRAM HYDROBROMIDE 10 MG PO TABS
30.0000 mg | ORAL_TABLET | Freq: Every day | ORAL | 1 refills | Status: DC
Start: 2020-01-16 — End: 2020-01-16

## 2020-01-15 MED ORDER — INSULIN GLARGINE 100 UNIT/ML ~~LOC~~ SOLN
26.0000 [IU] | Freq: Every day | SUBCUTANEOUS | Status: DC
  Administered 2020-01-15: 26 [IU] via SUBCUTANEOUS
  Filled 2020-01-15: qty 0.26

## 2020-01-15 MED ORDER — INSULIN GLARGINE 100 UNIT/ML SOLOSTAR PEN: 26.0000 [IU] | PEN_INJECTOR | Freq: Every day | SUBCUTANEOUS | 11 refills | Status: DC

## 2020-01-15 MED ORDER — INSULIN ASPART PROT & ASPART (70-30 MIX) 100 UNIT/ML ~~LOC~~ SUSP: 50.0000 [IU] | Freq: Every day | SUBCUTANEOUS | Status: DC

## 2020-01-15 MED ORDER — ALUM & MAG HYDROXIDE-SIMETH 200-200-20 MG/5ML PO SUSP: 30.0000 mL | ORAL | Status: DC | PRN

## 2020-01-15 MED ORDER — FOLIC ACID 1 MG PO TABS
1.0000 mg | ORAL_TABLET | Freq: Every day | ORAL | Status: DC
  Filled 2020-01-15: qty 1

## 2020-01-15 MED ORDER — THIAMINE HCL 100 MG/ML IJ SOLN: 100.0000 mg | Freq: Every day | INTRAMUSCULAR | Status: DC

## 2020-01-15 MED ORDER — ONDANSETRON HCL 4 MG PO TABS: 4.0000 mg | ORAL_TABLET | Freq: Four times a day (QID) | ORAL | Status: DC | PRN

## 2020-01-15 MED ORDER — INSULIN ASPART 100 UNIT/ML ~~LOC~~ SOLN: 5.0000 [IU] | Freq: Three times a day (TID) | SUBCUTANEOUS | Status: DC

## 2020-01-15 MED ORDER — AMLODIPINE BESYLATE 5 MG PO TABS: 5.0000 mg | ORAL_TABLET | Freq: Every day | ORAL | 1 refills | Status: DC

## 2020-01-15 MED ORDER — INSULIN ASPART PROT & ASPART (70-30 MIX) 100 UNIT/ML ~~LOC~~ SUSP: 30.0000 [IU] | Freq: Every day | SUBCUTANEOUS | Status: DC

## 2020-01-15 MED ORDER — ELVITEG-COBIC-EMTRICIT-TENOFAF 150-150-200-10 MG PO TABS
1.0000 | ORAL_TABLET | Freq: Every day | ORAL | Status: DC
  Filled 2020-01-15: qty 1

## 2020-01-15 MED ORDER — INSULIN ASPART 100 UNIT/ML ~~LOC~~ SOLN: 0.0000 [IU] | Freq: Every day | SUBCUTANEOUS | Status: DC

## 2020-01-15 NOTE — Plan of Care (Signed)
  Problem: Education: Goal: Knowledge of General Education information will improve Description: Including pain rating scale, medication(s)/side effects and non-pharmacologic comfort measures Outcome: Adequate for Discharge   Problem: Health Behavior/Discharge Planning: Goal: Ability to manage health-related needs will improve Outcome: Adequate for Discharge   Problem: Clinical Measurements: Goal: Ability to maintain clinical measurements within normal limits will improve Outcome: Adequate for Discharge Goal: Will remain free from infection Outcome: Adequate for Discharge Goal: Diagnostic test results will improve Outcome: Adequate for Discharge Goal: Respiratory complications will improve Outcome: Adequate for Discharge Goal: Cardiovascular complication will be avoided Outcome: Adequate for Discharge   Problem: Activity: Goal: Risk for activity intolerance will decrease Outcome: Adequate for Discharge   Problem: Nutrition: Goal: Adequate nutrition will be maintained Outcome: Adequate for Discharge   Problem: Coping: Goal: Level of anxiety will decrease Outcome: Adequate for Discharge   Problem: Elimination: Goal: Will not experience complications related to bowel motility Outcome: Adequate for Discharge Goal: Will not experience complications related to urinary retention Outcome: Adequate for Discharge   Problem: Pain Managment: Goal: General experience of comfort will improve Outcome: Adequate for Discharge   Problem: Safety: Goal: Ability to remain free from injury will improve Outcome: Adequate for Discharge   Problem: Skin Integrity: Goal: Risk for impaired skin integrity will decrease Outcome: Adequate for Discharge   Problem: Education: Goal: Individualized Educational Video(s) Outcome: Adequate for Discharge   Problem: Coping: Goal: Ability to adjust to condition or change in health will improve Outcome: Adequate for Discharge   Problem: Health  Behavior/Discharge Planning: Goal: Ability to identify and utilize available resources and services will improve Outcome: Adequate for Discharge Goal: Ability to manage health-related needs will improve Outcome: Adequate for Discharge   Problem: Metabolic: Goal: Ability to maintain appropriate glucose levels will improve Outcome: Adequate for Discharge   Problem: Nutritional: Goal: Progress toward achieving an optimal weight will improve Outcome: Adequate for Discharge   Problem: Skin Integrity: Goal: Risk for impaired skin integrity will decrease Outcome: Adequate for Discharge   Problem: Tissue Perfusion: Goal: Adequacy of tissue perfusion will improve Outcome: Adequate for Discharge

## 2020-01-15 NOTE — Progress Notes (Signed)
Inpatient Diabetes Program Recommendations  AACE/ADA: New Consensus Statement on Inpatient Glycemic Control (2015)  Target Ranges:  Prepandial:   less than 140 mg/dL      Peak postprandial:   less than 180 mg/dL (1-2 hours)      Critically ill patients:  140 - 180 mg/dL   Lab Results  Component Value Date   GLUCAP 305 (H) 01/15/2020   HGBA1C 7.8 (H) 12/06/2019    Review of Glycemic Control Results for [name], [name] (MRN [id number]) as of 01/15/2020 12:48  Ref. Range 01/14/2020 19:16 01/14/2020 23:34 01/15/2020 03:35 01/15/2020 07:29 01/15/2020 11:47  Glucose-Capillary Latest Ref Range: 70 - 99 mg/dL 761 (H) 607 (H) 371 (H) 251 (H) 305 (H)   Diabetes history:DM 1 Outpatient Diabetes medications: Novolin 70/30 50 units in the AM and 36 units in the PM Current orders for Inpatient glycemic control: Novolog 0-7 units Q4H  Inpatient Diabetes Program Recommendations:   Noted patient plans for transfer to inpatient psychiatric hospitalization. Patient'CBGs elevated. Type 1 so will need basal + meal coverage. Consider: -Lantus 26 units qd -Novolog 5 units tid meal coverage if eats 50% Continue Novolog correction scale. Secure chat sent to Dr. Lucianne Muss.  Thank you, Billy Fischer. Kalise Fickett, RN, MSN, CDE  Diabetes Coordinator Inpatient Glycemic Control Team Team Pager [phone] (8am-5pm) 01/15/2020 12:51 PM

## 2020-01-15 NOTE — Discharge Instructions (Signed)
Patient has been discharged to behavioral health at [facility] for inpatient psych hospitalization Advised to take Celexa 30 mg p.o. daily Patient has been discharged on Lantus 26 units at bedtime along with NovoLog 5 mg 3 times daily with meals. Patient has been started on amlodipine 5 mg for blood pressure control.

## 2020-01-15 NOTE — Discharge Summary (Signed)
Physician Discharge Summary  [name] JSE:[id number] DOB: 1985/12/13 DOA: 01/12/2020  PCP: [name], No Pcp Per  Admit date: 01/12/2020   Discharge date: 01/15/2020  Admitted From: Home  Disposition: Behavioral health inpatient psych facility at [facility].  Recommendations for Outpatient Follow-up:  1. Follow up with PCP in 1-2 weeks. 2. Please obtain BMP/CBC in one week. 3. [name] has been discharged to behavioral health at [facility] for inpatient psych hospitalization. 4. Advised to take Celexa 30 mg p.o. daily 5. [name] has been discharged on Lantus 26 units at bedtime along with NovoLog 5 mg three times daily with meals. 6. Advised to take amlodipine 5 mg daily for blood pressure control.  Home Health: None.  Equipment/Devices: None.  Discharge Condition: Stable CODE STATUS:Full code Diet recommendation: Heart Healthy  Brief [facility] course: This 34 yrs old male with medical history significant for HIV, depression, insulin-dependent diabetes mellitus, alcohol abuse presented in the emergency department for the evaluation of abdominal discomfort,nausea,vomiting,diarrhea and low blood sugar. [name] has been drinking beer. He was found to be hypoglycemic. He was given glucagon but continued to be remain hypoglycemic. [name] was admitted for recurrent hypoglycemia,started on D10W, [name] has withdrawal symptoms from alcohol presenting with shaking,sweating started on CIWA protocol.   Blood sugar has improved,  [name] has started eating.  D10W discontinued,  blood sugar has been controlled.  [name] is started back on his insulin dosing.  [name] is also started on amlodipine 5 mg for blood pressure control.  [name] continues to remain depressed , psych consulted recommended inpatient psych hospitalization once medically stable.  [name] is cleared from medical perspective to be transferred to inpatient psych.  [name] has bed at behavioral health at  [facility] and [name] is being transferred.  He was managed for below problems.  Discharge Diagnoses:  Principal Problem:   Hypoglycemia Active Problems:   Diabetes mellitus type 1 (HCC)   HIV infection ([place])   Depression   Syphilis   SIRS (systemic inflammatory response syndrome) (HCC)  Hypoglycemia; insulin-dependent DM -[name] with IDDM, A1c 7.8% in October, presents with recurrent hypoglycemia in setting of taking insulin but not eating and has had recurrent hypoglycemic episodes in ED despite IV dextrose and encouragement to eat and drink. -Continued 10% dextrose infusion with frequent CBGs, encouraged eating/drinking -Fingersticks improved,  [name] started eating, D5 water discontinued.  2.HIV -CD4 was 641 and VL <20 in September  - Syphilis was treated, titers followed by ID -Continue Genvoya  3.Depression, anxiety -Continue Celexa -[name] reports severe depression with intermittent thoughts about hurting himself. Denies suicidal ideations. -Stat psych consulted , recommended inpatient psych hospitalization once medically stable.  4.SIRS -[name] meets SIRS criteria with leukocytosis and mild tachycardia.  -He is afebrile, reports N/V/D but has benign abdominal exam and no urinary sxs, no respiratory sxs, and no meningismus or apparent skin infection -Mild tachycardia resolved with IVF and leukocytosis likely reactive though C diff possible -[name] remained afebrile, SIRS criteria resolved.  5.  Alcohol abuse: [name] had withdrawal symptoms yesterday started on CIWA protocol.   Discharge Instructions  Discharge Instructions    Call MD for:  difficulty breathing, headache or visual disturbances   Complete by: As directed    Call MD for:  persistant dizziness or light-headedness   Complete by: As directed    Call MD for:  persistant nausea and vomiting   Complete by: As directed    Diet - low sodium heart healthy   Complete by:  As directed  Diet Carb Modified   Complete by: As directed    Discharge instructions   Complete by: As directed    [name] has been discharged to behavioral health at [facility] for inpatient psych hospitalization Advised to take Celexa 30 mg p.o. daily [name] has been discharged on Lantus 26 units at bedtime along with NovoLog 5 mg 3 times daily with meals.   Increase activity slowly   Complete by: As directed      Allergies as of 01/15/2020      Reactions   Shellfish Allergy Anaphylaxis, Hives   Aspirin Other (See Comments)   Unknown reaction      Medication List    STOP taking these medications   insulin aspart protamine- aspart (70-30) 100 UNIT/ML injection Commonly known as: NOVOLOG MIX 70/30     TAKE these medications   acetaminophen 500 MG tablet Commonly known as: TYLENOL Take 1 tablet (500 mg total) by mouth every 6 (six) hours as needed. What changed: reasons to take this   amLODipine 5 MG tablet Commonly known as: NORVASC Take 1 tablet (5 mg total) by mouth daily.   B-D UF III MINI PEN NEEDLES 31G X 5 MM Misc Generic drug: Insulin Pen Needle Use as instructed. Inject into the skin four times daily. ICD 10 E10.42   citalopram 10 MG tablet Commonly known as: CELEXA Take 3 tablets (30 mg total) by mouth daily. Start taking on: January 16, 2020 What changed:   medication strength  how much to take   feeding supplement Liqd Take 1 Container by mouth 2 (two) times daily as needed (supplement).   Genvoya 150-150-200-10 MG Tabs tablet Generic drug: elvitegravir-cobicistat-emtricitabine-tenofovir TAKE 1 TABLET BY MOUTH DAILY WITH BREAKFAST   insulin aspart 100 UNIT/ML FlexPen Commonly known as: NOVOLOG Inject 5 Units into the skin 3 (three) times daily with meals.   insulin glargine 100 UNIT/ML Solostar Pen Commonly known as: LANTUS Inject 26 Units into the skin daily.   multivitamin with minerals tablet Take 1 tablet by mouth daily.    ondansetron 4 MG tablet Commonly known as: ZOFRAN Take 1 tablet (4 mg total) by mouth every 6 (six) hours. What changed:   when to take this  reasons to take this   True Metrix Blood Glucose Test test strip Generic drug: glucose blood Use as instructed   True Metrix Meter w/Device Kit Use as instructed. Check blood glucose level by fingerstick three times per day.   TRUEplus Lancets 28G Misc Use as instructed. Check blood glucose level by fingerstick three per day.       Follow-up Information    Comer, Okey Regal, MD Follow up in 2 week(s).   Specialty: Infectious Diseases Contact information: [address] [place] [address] [phone]              Allergies  Allergen Reactions  . Shellfish Allergy Anaphylaxis and Hives  . Aspirin Other (See Comments)    Unknown reaction    Consultations:  Psychiatry   Procedures/Studies: No results found. None   Subjective: [name] was seen and examined at bedside. Overnight events noted.  [name]'s blood sugar has been improving.  [name] continues to remain depressed and sad.  Psych consulted,  recommended inpatient psych hospitalization once medically cleared.  Discharge Exam: Vitals:   01/15/20 1153 01/15/20 1154  BP: (!) 190/109   Pulse: 91 88  Resp: 19   Temp: 98.3 F (36.8 C) 98.3 F (36.8 C)  SpO2: 100% 98%  Vitals:   01/15/20 0730 01/15/20 0803 01/15/20 1153 01/15/20 1154  BP:  (!) 143/93 (!) 190/109   Pulse: 88 80 91 88  Resp:  18 19   Temp: 99.5 F (37.5 C)  98.3 F (36.8 C) 98.3 F (36.8 C)  TempSrc: Oral  Oral Oral  SpO2: 99% 98% 100% 98%  Weight:      Height:        General: Pt is alert, awake, not in acute distress Cardiovascular: RRR, S1/S2 +, no rubs, no gallops Respiratory: CTA bilaterally, no wheezing, no rhonchi Abdominal: Soft, NT, ND, bowel sounds + Extremities: no edema, no cyanosis    The results of significant diagnostics from this hospitalization  (including imaging, microbiology, ancillary and laboratory) are listed below for reference.     Microbiology: Recent Results (from the past 240 hour(s))  Respiratory Panel by RT PCR (Flu A&B, Covid) - Nasopharyngeal Swab     Status: None   Collection Time: 01/13/20  1:40 AM   Specimen: Nasopharyngeal Swab  Result Value Ref Range Status   SARS Coronavirus 2 by RT PCR NEGATIVE NEGATIVE Final    Comment: (NOTE) SARS-CoV-2 target nucleic acids are NOT DETECTED.  The SARS-CoV-2 RNA is generally detectable in upper respiratoy specimens during the acute phase of infection. The lowest concentration of SARS-CoV-2 viral copies this assay can detect is 131 copies/mL. A negative result does not preclude SARS-Cov-2 infection and should not be used as the sole basis for treatment or other [name] management decisions. A negative result may occur with  improper specimen collection/handling, submission of specimen other than nasopharyngeal swab, presence of viral mutation(s) within the areas targeted by this assay, and inadequate number of viral copies (<131 copies/mL). A negative result must be combined with clinical observations, [name] history, and epidemiological information. The expected result is Negative.  Fact Sheet for Patients:  PinkCheek.be  Fact Sheet for Healthcare Providers:  GravelBags.it  This test is no t yet approved or cleared by the [place] FDA and  has been authorized for detection and/or diagnosis of SARS-CoV-2 by FDA under an Emergency Use Authorization (EUA). This EUA will remain  in effect (meaning this test can be used) for the duration of the COVID-19 declaration under Section 564(b)(1) of the Act, 21 U.S.C. section 360bbb-3(b)(1), unless the authorization is terminated or revoked sooner.     Influenza A by PCR NEGATIVE NEGATIVE Final   Influenza B by PCR NEGATIVE NEGATIVE Final    Comment:  (NOTE) The Xpert Xpress SARS-CoV-2/FLU/RSV assay is intended as an aid in  the diagnosis of influenza from Nasopharyngeal swab specimens and  should not be used as a sole basis for treatment. Nasal washings and  aspirates are unacceptable for Xpert Xpress SARS-CoV-2/FLU/RSV  testing.  Fact Sheet for Patients: PinkCheek.be  Fact Sheet for Healthcare Providers: GravelBags.it  This test is not yet approved or cleared by the [place] FDA and  has been authorized for detection and/or diagnosis of SARS-CoV-2 by  FDA under an Emergency Use Authorization (EUA). This EUA will remain  in effect (meaning this test can be used) for the duration of the  Covid-19 declaration under Section 564(b)(1) of the Act, 21  U.S.C. section 360bbb-3(b)(1), unless the authorization is  terminated or revoked. Performed at [facility], [place] [address], [place],  [address]   MRSA PCR Screening     Status: None   Collection Time: 01/13/20  5:53 PM   Specimen: Nasal Mucosa; Nasopharyngeal  Result Value  Ref Range Status   MRSA by PCR NEGATIVE NEGATIVE Final    Comment:        The GeneXpert MRSA Assay (FDA approved for NASAL specimens only), is one component of a comprehensive MRSA colonization surveillance program. It is not intended to diagnose MRSA infection nor to guide or monitor treatment for MRSA infections. Performed at [facility], [place] [address], [place], [place] [address]      Labs: BNP (last 3 results) No results for input(s): BNP in the last 8760 hours. Basic Metabolic Panel: Recent Labs  Lab 01/13/20 0022 01/13/20 0348 01/13/20 0928 01/14/20 0331 01/15/20 0205  NA 139 139 136 136 135  K 4.4 4.4 3.6 4.0 4.3  CL 99 102 99 101 101  CO2 $Re'28 26 26 26 26  'aMn$ GLUCOSE 173* 55* 131* 157* 235*  BUN $Re'18 17 13 16 19  'yio$ CREATININE 1.08 1.02 1.22 1.36* 1.46*  CALCIUM 9.8 9.9 9.4 9.4 9.6  MG  --   --  1.8 1.8  --    PHOS  --   --  4.1 3.4  --    Liver Function Tests: Recent Labs  Lab 01/13/20 0022 01/13/20 0928 01/14/20 0331  AST $Re'26 25 20  'IYd$ ALT $R'18 16 18  'iK$ ALKPHOS 52 53 52  BILITOT 1.0 1.1 0.8  PROT 7.2 6.5 6.7  ALBUMIN 4.1 3.6 3.5   Recent Labs  Lab 01/13/20 0022  LIPASE 23   No results for input(s): AMMONIA in the last 168 hours. CBC: Recent Labs  Lab 01/13/20 0022 01/13/20 0348 01/13/20 0928 01/14/20 0331  WBC 13.5* 11.3* 9.2 7.1  NEUTROABS 12.1* 9.8*  --   --   HGB 15.7 15.5 14.9 15.8  HCT 46.2 44.3 43.7 45.4  MCV 93.0 92.5 93.8 91.9  PLT 259 263 266 265   Cardiac Enzymes: No results for input(s): CKTOTAL, CKMB, CKMBINDEX, TROPONINI in the last 168 hours. BNP: Invalid input(s): POCBNP CBG: Recent Labs  Lab 01/14/20 1916 01/14/20 2334 01/15/20 0335 01/15/20 0729 01/15/20 1147  GLUCAP 277* 296* 233* 251* 305*   D-Dimer No results for input(s): DDIMER in the last 72 hours. Hgb A1c No results for input(s): HGBA1C in the last 72 hours. Lipid Profile No results for input(s): CHOL, HDL, LDLCALC, TRIG, CHOLHDL, LDLDIRECT in the last 72 hours. Thyroid function studies No results for input(s): TSH, T4TOTAL, T3FREE, THYROIDAB in the last 72 hours.  Invalid input(s): FREET3 Anemia work up No results for input(s): VITAMINB12, FOLATE, FERRITIN, TIBC, IRON, RETICCTPCT in the last 72 hours. Urinalysis    Component Value Date/Time   COLORURINE YELLOW 01/13/2020 0143   APPEARANCEUR CLEAR 01/13/2020 0143   LABSPEC 1.026 01/13/2020 0143   PHURINE 6.0 01/13/2020 0143   GLUCOSEU 150 (A) 01/13/2020 0143   HGBUR NEGATIVE 01/13/2020 0143   BILIRUBINUR NEGATIVE 01/13/2020 0143   KETONESUR 20 (A) 01/13/2020 0143   PROTEINUR >=300 (A) 01/13/2020 0143   UROBILINOGEN 0.2 02/10/2015 1416   NITRITE NEGATIVE 01/13/2020 0143   LEUKOCYTESUR NEGATIVE 01/13/2020 0143   Sepsis Labs Invalid input(s): PROCALCITONIN,  WBC,  LACTICIDVEN Microbiology Recent Results (from the past 240  hour(s))  Respiratory Panel by RT PCR (Flu A&B, Covid) - Nasopharyngeal Swab     Status: None   Collection Time: 01/13/20  1:40 AM   Specimen: Nasopharyngeal Swab  Result Value Ref Range Status   SARS Coronavirus 2 by RT PCR NEGATIVE NEGATIVE Final    Comment: (NOTE) SARS-CoV-2 target nucleic acids are NOT DETECTED.  The  SARS-CoV-2 RNA is generally detectable in upper respiratoy specimens during the acute phase of infection. The lowest concentration of SARS-CoV-2 viral copies this assay can detect is 131 copies/mL. A negative result does not preclude SARS-Cov-2 infection and should not be used as the sole basis for treatment or other [name] management decisions. A negative result may occur with  improper specimen collection/handling, submission of specimen other than nasopharyngeal swab, presence of viral mutation(s) within the areas targeted by this assay, and inadequate number of viral copies (<131 copies/mL). A negative result must be combined with clinical observations, [name] history, and epidemiological information. The expected result is Negative.  Fact Sheet for Patients:  PinkCheek.be  Fact Sheet for Healthcare Providers:  GravelBags.it  This test is no t yet approved or cleared by the [place] FDA and  has been authorized for detection and/or diagnosis of SARS-CoV-2 by FDA under an Emergency Use Authorization (EUA). This EUA will remain  in effect (meaning this test can be used) for the duration of the COVID-19 declaration under Section 564(b)(1) of the Act, 21 U.S.C. section 360bbb-3(b)(1), unless the authorization is terminated or revoked sooner.     Influenza A by PCR NEGATIVE NEGATIVE Final   Influenza B by PCR NEGATIVE NEGATIVE Final    Comment: (NOTE) The Xpert Xpress SARS-CoV-2/FLU/RSV assay is intended as an aid in  the diagnosis of influenza from Nasopharyngeal swab specimens and  should not  be used as a sole basis for treatment. Nasal washings and  aspirates are unacceptable for Xpert Xpress SARS-CoV-2/FLU/RSV  testing.  Fact Sheet for Patients: PinkCheek.be  Fact Sheet for Healthcare Providers: GravelBags.it  This test is not yet approved or cleared by the [place] FDA and  has been authorized for detection and/or diagnosis of SARS-CoV-2 by  FDA under an Emergency Use Authorization (EUA). This EUA will remain  in effect (meaning this test can be used) for the duration of the  Covid-19 declaration under Section 564(b)(1) of the Act, 21  U.S.C. section 360bbb-3(b)(1), unless the authorization is  terminated or revoked. Performed at [facility], [place] [address] [place], [place] [address]   MRSA PCR Screening     Status: None   Collection Time: 01/13/20  5:53 PM   Specimen: Nasal Mucosa; Nasopharyngeal  Result Value Ref Range Status   MRSA by PCR NEGATIVE NEGATIVE Final    Comment:        The GeneXpert MRSA Assay (FDA approved for NASAL specimens only), is one component of a comprehensive MRSA colonization surveillance program. It is not intended to diagnose MRSA infection nor to guide or monitor treatment for MRSA infections. Performed at [facility], [place] [address], [place], [place] [address]      Time coordinating discharge: Over 30 minutes  SIGNED:   Shawna Clamp, MD  [facility] 01/15/2020, 1:34 PM Pager   If 7PM-7AM, please contact night-coverage [email or web address]

## 2020-01-15 NOTE — TOC Progression Note (Signed)
Transition of Care [facility]) - Progression Note    Patient Details  Name: Elster Corbello MRN: [id number] Date of Birth: 10-12-1985  Transition of Care [facility]) CM/SW Contact  Beckie Busing, RN Phone Number: [phone]  01/15/2020, 2:05 PM  Clinical Narrative:    Patient to be transferred to [facility] RM# [id number] Bed will be available at 1530. Patient has been made aware. Transportation has been set up via safe ride. Primary nurse has been made aware.         Expected Discharge Plan and Services           Expected Discharge Date: 01/15/20                                     Social Determinants of Health (SDOH) Interventions    Readmission Risk Interventions No flowsheet data found.

## 2020-01-15 NOTE — TOC Initial Note (Signed)
Transition of Care [facility]) - Initial/Assessment Note    Patient Details  Name: [name] MRN: [id number] Date of Birth: Sep 26, 1985  [facility]) CM/SW Contact:    Beckie Busing, RN Phone Number: [phone] 01/15/2020, 2:45 PM  Clinical Narrative:                 CM at bedside to make patient aware tof plans for [facility] Patient is tearful and unablt ot answer specific questions about his living conditions. Patient does state that he has a mother and 2 sisters that he loves a lot. Patient also expresses concern for his own well being but states that he is willing to go to behavioral health to get counseling. Patient expresses hopelessness. CM has made patient aware that CM is seeking bed at [facility]. Patient is agreeable to placement at this time. CM will continue to seek [facility] bed.    Expected Discharge Plan: Psychiatric Hospital Barriers to Discharge: No Barriers Identified   Patient Goals and CMS Choice     Choice offered to / list presented to : NA  Expected Discharge Plan and Services Expected Discharge Plan: Psychiatric Hospital In-house Referral: NA Discharge Planning Services: CM Consult   Living arrangements for the past 2 months: Single Family Home Expected Discharge Date: 01/15/20               DME Arranged: N/A DME Agency: NA       HH Arranged: NA HH Agency: NA        Prior Living Arrangements/Services Living arrangements for the past 2 months: Single Family Home Lives with:: Relatives Patient language and need for interpreter reviewed:: Yes Do you feel safe going back to the place where you live?: Yes      Need for Family Participation in Patient Care: Yes (Comment) Care giver support system in place?: Yes (comment)   Criminal Activity/Legal Involvement Pertinent to Current Situation/Hospitalization: No - Comment as needed  Activities of Daily Living Home Assistive Devices/Equipment: CBG Meter ADL Screening (condition at time of  admission) Patient's cognitive ability adequate to safely complete daily activities?: Yes Is the patient deaf or have difficulty hearing?: No Does the patient have difficulty seeing, even when wearing glasses/contacts?: No Does the patient have difficulty concentrating, remembering, or making decisions?: No Patient able to express need for assistance with ADLs?: Yes Does the patient have difficulty dressing or bathing?: No Independently performs ADLs?: Yes (appropriate for developmental age) Does the patient have difficulty walking or climbing stairs?: No Weakness of Legs: None Weakness of Arms/Hands: None  Permission Sought/Granted   Permission granted to share information with : No              Emotional Assessment Appearance:: Appears stated age Attitude/Demeanor/Rapport: Crying Affect (typically observed): Afraid/Fearful, Depressed, Tearful/Crying Orientation: : Oriented to Self, Oriented to Place, Oriented to  Time, Oriented to Situation Alcohol / Substance Use: Alcohol Use Psych Involvement: Yes (comment) (To transfer to [facility])  Admission diagnosis:  Hypoglycemia [E16.2] Nausea vomiting and diarrhea [R11.2, R19.7] HIV infection, unspecified symptom status (HCC) [B20] Patient Active Problem List   Diagnosis Date Noted   SIRS (systemic inflammatory response syndrome) (HCC)    Healthcare maintenance 12/05/2019   Hypoglycemia 03/31/2019   Syphilis 01/15/2019   Closed fracture of base of fifth metatarsal bone of right foot 11/13/2017   Closed displaced comminuted fracture of shaft of right tibia 11/01/2017   Closed fracture of medial malleolus of right ankle 10/31/2017  Vaccine counseling 12/14/2016   Screening examination for venereal disease 06/09/2015   Encounter for long-term (current) use of medications 06/09/2015   Depression 03/04/2015   Dizziness and giddiness 03/04/2015   Tobacco dependence 11/12/2014   HIV infection (HCC)  11/11/2014   Stress 12/19/2012   Asthma 10/02/2010   Diabetes mellitus type 1 (HCC) 03/01/1991   PCP:  Patient, No Pcp Per Pharmacy:   [id number] - [place], [place] - [address] AT [facility] OF [address] [place] [address] Phone: [phone] Fax: [phone]     Social Determinants of Health (SDOH) Interventions    Readmission Risk Interventions Readmission Risk Prevention Plan 01/15/2020  Transportation Screening Complete  PCP or Specialist Appt within 5-7 Days Complete  Home Care Screening Complete  Medication Review (RN CM) Complete  Some recent data might be hidden

## 2020-01-15 NOTE — Plan of Care (Signed)
  Problem: Education: Goal: Knowledge of General Education information will improve Description: Including pain rating scale, medication(s)/side effects and non-pharmacologic comfort measures Outcome: Adequate for Discharge   Problem: Health Behavior/Discharge Planning: Goal: Ability to manage health-related needs will improve Outcome: Adequate for Discharge   Problem: Clinical Measurements: Goal: Ability to maintain clinical measurements within normal limits will improve Outcome: Adequate for Discharge Goal: Will remain free from infection Outcome: Adequate for Discharge Goal: Diagnostic test results will improve Outcome: Adequate for Discharge Goal: Respiratory complications will improve Outcome: Adequate for Discharge Goal: Cardiovascular complication will be avoided Outcome: Adequate for Discharge   Problem: Activity: Goal: Risk for activity intolerance will decrease Outcome: Adequate for Discharge   Problem: Nutrition: Goal: Adequate nutrition will be maintained Outcome: Adequate for Discharge   Problem: Coping: Goal: Level of anxiety will decrease Outcome: Adequate for Discharge   Problem: Elimination: Goal: Will not experience complications related to bowel motility Outcome: Adequate for Discharge Goal: Will not experience complications related to urinary retention Outcome: Adequate for Discharge   Problem: Pain Managment: Goal: General experience of comfort will improve Outcome: Adequate for Discharge   Problem: Safety: Goal: Ability to remain free from injury will improve Outcome: Adequate for Discharge   Problem: Skin Integrity: Goal: Risk for impaired skin integrity will decrease Outcome: Adequate for Discharge   Problem: Education: Goal: Individualized Educational Video(s) Outcome: Adequate for Discharge   Problem: Coping: Goal: Ability to adjust to condition or change in health will improve Outcome: Adequate for Discharge   Problem: Health  Behavior/Discharge Planning: Goal: Ability to identify and utilize available resources and services will improve Outcome: Adequate for Discharge Goal: Ability to manage health-related needs will improve Outcome: Adequate for Discharge   Problem: Metabolic: Goal: Ability to maintain appropriate glucose levels will improve Outcome: Adequate for Discharge   Problem: Nutritional: Goal: Progress toward achieving an optimal weight will improve Outcome: Adequate for Discharge   Problem: Skin Integrity: Goal: Risk for impaired skin integrity will decrease Outcome: Adequate for Discharge   Problem: Tissue Perfusion: Goal: Adequacy of tissue perfusion will improve Outcome: Adequate for Discharge   

## 2020-01-15 NOTE — Care Plan (Signed)
Patient is clear from medical perspective for inpatient psychiatric hospitalization.

## 2020-01-15 NOTE — Progress Notes (Signed)
Patient ID: [name], male   DOB: 1985-06-17, 34 y.o.   MRN: [id number]   Charge nurse informed provider that patient had arrived as a transfer from medical Hospital without commitment paperwork and is refusing to be admitted to psych hospital.   Review of the charts confirms that patient was on the medical floor is being treated for abdominal pain and was recommended for voluntary admission to psych unit for treatment of ongoing depression.   Upon evaluation in the incoming patient area, patient was refusing to come to the psychiatric hospital.  He stated that "I was just in the hospital for 3 days and I did not realize I was coming to a place like this.  I do not want to stay at a hospital any longer and I want to just go."  Patient was offered the opportunity to stay overnight and readdress this issue in the morning, however he adamantly refused.   Patient denied any acute suicidal intent or plan.  Denied any hallucinations or manic symptoms.  He did not appear delusional nor paranoid or psychotic.  In my opinion, patient did not meet involuntary commitment criteria and in absence of his willingness to sign himself in voluntarily,  would have to be released.  Patient was given resources for outpatient psychiatric help as well as informed that he could return back to his hospital bed anytime should he decide to receive mental health treatment.  Patient was informed to call 911 with any emergencies.

## 2020-01-15 NOTE — TOC CAGE-AID Note (Signed)
Transition of Care [facility]) - CAGE-AID Screening   Patient Details  Name: [name] MRN: [id number] Date of Birth: June 15, 1985  Transition of [facility]) CM/SW Contact:    Beckie Busing, RN Phone Number: [phone] 01/15/2020, 2:42 PM   Clinical Narrative: Patient refused stating drugs are not his issue. His mental health and well being is his primary concern right now and it has nothing to do with drugs   CAGE-AID Screening: Substance Abuse Screening unable to be completed due to: : Patient Refused             Substance Abuse Education Offered: Yes  Substance abuse interventions: Other (must comment) (Transfering to behavioral health)

## 2020-01-15 NOTE — Progress Notes (Signed)
Pt accepted to [facility], bed 306-1     Caryn Bee, FNP is the accepting provider.    Dr. Jola Babinski is the attending provider.    Call report to [phone]    [facility] @ [facility] notified.     Pt is voluntary and will be transported by [employer]  Pt is scheduled to arrive at [facility] at 330pm.    Wells Guiles, MSW, LCSW, LCAS Clinical Social Worker II Disposition CSW [phone]

## 2020-01-17 ENCOUNTER — Ambulatory Visit: Payer: Self-pay

## 2020-01-28 ENCOUNTER — Ambulatory Visit: Payer: Self-pay

## 2020-01-30 ENCOUNTER — Other Ambulatory Visit: Payer: Self-pay

## 2020-01-30 ENCOUNTER — Ambulatory Visit: Payer: Self-pay

## 2020-01-30 MED FILL — ?BASAGLAR 100 UNITS/ML KWPE: 100 | 30 days supply | Qty: 12 | Fill #7

## 2020-01-30 MED FILL — ?HUMALOG 100 UNITS/ML KWIKP: 100 | 30 days supply | Qty: 9 | Fill #4

## 2020-01-31 ENCOUNTER — Ambulatory Visit (INDEPENDENT_AMBULATORY_CARE_PROVIDER_SITE_OTHER): Payer: Self-pay

## 2020-01-31 DIAGNOSIS — Z23 Encounter for immunization: Secondary | ICD-10-CM

## 2020-01-31 NOTE — Progress Notes (Signed)
   Covid-19 Vaccination Clinic  Name:  Jessup Ogas    MRN: [id number] DOB: 08-Apr-1985  01/31/2020  Mr. Wenke was observed post Covid-19 immunization for 15 minutes without incident. He was provided with Vaccine Information Sheet and instruction to access the V-Safe system.   Mr. Henricks was instructed to call 911 with any severe reactions post vaccine: Marland Kitchen Difficulty breathing  . Swelling of face and throat  . A fast heartbeat  . A bad rash all over body  . Dizziness and weakness   Immunizations Administered    Name Date Dose VIS Date Route   Pfizer COVID-19 Vaccine 01/31/2020 10:24 AM 0.3 mL 12/18/2019 Intramuscular   Manufacturer: [employer], [employer]   Lot: [id number]   NDC: [address]-0     Lorenso Courier, CMA

## 2020-02-09 ENCOUNTER — Encounter (HOSPITAL_COMMUNITY): Payer: Self-pay | Admitting: *Deleted

## 2020-02-09 ENCOUNTER — Other Ambulatory Visit: Payer: Self-pay

## 2020-02-09 ENCOUNTER — Inpatient Hospital Stay (HOSPITAL_COMMUNITY)
Admission: EM | Admit: 2020-02-09 | Discharge: 2020-02-11 | DRG: 638 | Disposition: A | Discharge status: Home / Self Care | Payer: No Typology Code available for payment source | Attending: Internal Medicine | Admitting: Internal Medicine

## 2020-02-09 DIAGNOSIS — Z833 Family history of diabetes mellitus: Secondary | ICD-10-CM

## 2020-02-09 DIAGNOSIS — Z8349 Family history of other endocrine, nutritional and metabolic diseases: Secondary | ICD-10-CM

## 2020-02-09 DIAGNOSIS — F17290 Nicotine dependence, other tobacco product, uncomplicated: Secondary | ICD-10-CM | POA: Diagnosis present

## 2020-02-09 DIAGNOSIS — T383X5A Adverse effect of insulin and oral hypoglycemic [antidiabetic] drugs, initial encounter: Secondary | ICD-10-CM | POA: Diagnosis present

## 2020-02-09 DIAGNOSIS — F32A Depression, unspecified: Secondary | ICD-10-CM | POA: Diagnosis present

## 2020-02-09 DIAGNOSIS — E10649 Type 1 diabetes mellitus with hypoglycemia without coma: Principal | ICD-10-CM | POA: Diagnosis present

## 2020-02-09 DIAGNOSIS — R4584 Anhedonia: Secondary | ICD-10-CM | POA: Diagnosis present

## 2020-02-09 DIAGNOSIS — R4689 Other symptoms and signs involving appearance and behavior: Secondary | ICD-10-CM

## 2020-02-09 DIAGNOSIS — J45909 Unspecified asthma, uncomplicated: Secondary | ICD-10-CM | POA: Diagnosis present

## 2020-02-09 DIAGNOSIS — E162 Hypoglycemia, unspecified: Secondary | ICD-10-CM

## 2020-02-09 DIAGNOSIS — R45851 Suicidal ideations: Secondary | ICD-10-CM | POA: Diagnosis present

## 2020-02-09 DIAGNOSIS — R451 Restlessness and agitation: Secondary | ICD-10-CM | POA: Diagnosis present

## 2020-02-09 DIAGNOSIS — B20 Human immunodeficiency virus [HIV] disease: Secondary | ICD-10-CM | POA: Diagnosis present

## 2020-02-09 DIAGNOSIS — Z794 Long term (current) use of insulin: Secondary | ICD-10-CM

## 2020-02-09 DIAGNOSIS — Z91013 Allergy to seafood: Secondary | ICD-10-CM

## 2020-02-09 DIAGNOSIS — R4182 Altered mental status, unspecified: Secondary | ICD-10-CM

## 2020-02-09 DIAGNOSIS — F419 Anxiety disorder, unspecified: Secondary | ICD-10-CM | POA: Diagnosis present

## 2020-02-09 DIAGNOSIS — Z20822 Contact with and (suspected) exposure to covid-19: Secondary | ICD-10-CM | POA: Diagnosis present

## 2020-02-09 DIAGNOSIS — Z8659 Personal history of other mental and behavioral disorders: Secondary | ICD-10-CM

## 2020-02-09 DIAGNOSIS — E109 Type 1 diabetes mellitus without complications: Secondary | ICD-10-CM | POA: Diagnosis present

## 2020-02-09 DIAGNOSIS — E160 Drug-induced hypoglycemia without coma: Secondary | ICD-10-CM | POA: Diagnosis present

## 2020-02-09 DIAGNOSIS — Z781 Physical restraint status: Secondary | ICD-10-CM

## 2020-02-09 LAB — CBC WITH DIFFERENTIAL/PLATELET
Abs Immature Granulocytes: 0.04 10*3/uL (ref 0.00–0.07)
Basophils Absolute: 0 10*3/uL (ref 0.0–0.1)
Basophils Relative: 0 %
Eosinophils Absolute: 0.1 10*3/uL (ref 0.0–0.5)
Eosinophils Relative: 0 %
HCT: 45.7 % (ref 39.0–52.0)
Hemoglobin: 15.4 g/dL (ref 13.0–17.0)
Immature Granulocytes: 0 %
Lymphocytes Relative: 24 %
Lymphs Abs: 3.3 10*3/uL (ref 0.7–4.0)
MCH: 31.3 pg (ref 26.0–34.0)
MCHC: 33.7 g/dL (ref 30.0–36.0)
MCV: 92.9 fL (ref 80.0–100.0)
Monocytes Absolute: 0.9 10*3/uL (ref 0.1–1.0)
Monocytes Relative: 6 %
Neutro Abs: 9.7 10*3/uL — ABNORMAL HIGH (ref 1.7–7.7)
Neutrophils Relative %: 70 %
Platelets: 267 10*3/uL (ref 150–400)
RBC: 4.92 MIL/uL (ref 4.22–5.81)
RDW: 13.2 % (ref 11.5–15.5)
WBC: 13.9 10*3/uL — ABNORMAL HIGH (ref 4.0–10.5)
nRBC: 0 % (ref 0.0–0.2)

## 2020-02-09 LAB — CBC
HCT: 43.9 % (ref 39.0–52.0)
Hemoglobin: 14.6 g/dL (ref 13.0–17.0)
MCH: 31.1 pg (ref 26.0–34.0)
MCHC: 33.3 g/dL (ref 30.0–36.0)
MCV: 93.6 fL (ref 80.0–100.0)
Platelets: 241 10*3/uL (ref 150–400)
RBC: 4.69 MIL/uL (ref 4.22–5.81)
RDW: 13.3 % (ref 11.5–15.5)
WBC: 7.8 10*3/uL (ref 4.0–10.5)
nRBC: 0 % (ref 0.0–0.2)

## 2020-02-09 LAB — CBG MONITORING, ED
Glucose-Capillary: 120 mg/dL — ABNORMAL HIGH (ref 70–99)
Glucose-Capillary: 235 mg/dL — ABNORMAL HIGH (ref 70–99)
Glucose-Capillary: 28 mg/dL — CL (ref 70–99)
Glucose-Capillary: 68 mg/dL — ABNORMAL LOW (ref 70–99)
Glucose-Capillary: 71 mg/dL (ref 70–99)
Glucose-Capillary: 72 mg/dL (ref 70–99)
Glucose-Capillary: 89 mg/dL (ref 70–99)

## 2020-02-09 LAB — BASIC METABOLIC PANEL
Anion gap: 13 (ref 5–15)
BUN: 13 mg/dL (ref 6–20)
CO2: 21 mmol/L — ABNORMAL LOW (ref 22–32)
Calcium: 9.8 mg/dL (ref 8.9–10.3)
Chloride: 105 mmol/L (ref 98–111)
Creatinine, Ser: 1.31 mg/dL — ABNORMAL HIGH (ref 0.61–1.24)
GFR, Estimated: >60 mL/min (ref >60)
Glucose, Bld: 64 mg/dL — ABNORMAL LOW (ref 70–99)
Potassium: 4.3 mmol/L (ref 3.5–5.1)
Sodium: 139 mmol/L (ref 135–145)

## 2020-02-09 LAB — RESP PANEL BY RT-PCR (FLU A&B, COVID) ARPGX2
Influenza A by PCR: NEGATIVE
Influenza B by PCR: NEGATIVE
SARS Coronavirus 2 by RT PCR: NEGATIVE

## 2020-02-09 LAB — ETHANOL: Alcohol, Ethyl (B): <10 mg/dL (ref <10)

## 2020-02-09 LAB — CREATININE, SERUM
Creatinine, Ser: 1.03 mg/dL (ref 0.61–1.24)
GFR, Estimated: >60 mL/min (ref >60)

## 2020-02-09 MED ORDER — DEXTROSE 50 % IV SOLN
50.0000 mL | Freq: Once | INTRAVENOUS | Status: AC
  Filled 2020-02-09: qty 50

## 2020-02-09 MED ORDER — ZIPRASIDONE MESYLATE 20 MG IM SOLR: 20.0000 mg | Freq: Once | INTRAMUSCULAR | Status: DC

## 2020-02-09 MED ORDER — HALOPERIDOL LACTATE 5 MG/ML IJ SOLN
5.0000 mg | Freq: Once | INTRAMUSCULAR | Status: AC
  Administered 2020-02-09: 17:00:00 5 mg via INTRAMUSCULAR
  Filled 2020-02-09: qty 1

## 2020-02-09 MED ORDER — LORAZEPAM 2 MG/ML IJ SOLN: 1.0000 mg | INTRAMUSCULAR | Status: DC | PRN

## 2020-02-09 MED ORDER — DEXTROSE 50 % IV SOLN
50.0000 mL | Freq: Once | INTRAVENOUS | Status: AC
  Administered 2020-02-09: 18:00:00 50 mL via INTRAVENOUS

## 2020-02-09 MED ORDER — ENOXAPARIN SODIUM 40 MG/0.4ML ~~LOC~~ SOLN
40.0000 mg | SUBCUTANEOUS | Status: DC
  Administered 2020-02-09: 22:00:00 40 mg via SUBCUTANEOUS
  Filled 2020-02-09: qty 0.4

## 2020-02-09 MED ORDER — MIDAZOLAM HCL 2 MG/2ML IJ SOLN
INTRAMUSCULAR | Status: AC
  Administered 2020-02-09: 17:00:00 4 mg via INTRAMUSCULAR
  Filled 2020-02-09: qty 4

## 2020-02-09 MED ORDER — DEXTROSE 50 % IV SOLN
INTRAVENOUS | Status: AC
  Administered 2020-02-09: 18:00:00 50 mL via INTRAVENOUS
  Filled 2020-02-09: qty 50

## 2020-02-09 MED ORDER — DEXTROSE 50 % IV SOLN
INTRAVENOUS | Status: AC
  Administered 2020-02-09: 23:00:00 50 mL via INTRAVENOUS
  Filled 2020-02-09: qty 50

## 2020-02-09 MED ORDER — MIDAZOLAM HCL 2 MG/2ML IJ SOLN: 4.0000 mg | Freq: Once | INTRAMUSCULAR | Status: AC

## 2020-02-09 MED ORDER — SODIUM CHLORIDE 0.9 % IV SOLN: 250.0000 mL | INTRAVENOUS | Status: DC | PRN

## 2020-02-09 MED ORDER — DEXTROSE 50 % IV SOLN: 1.0000 | Freq: Once | INTRAVENOUS | Status: AC

## 2020-02-09 MED ORDER — DEXTROSE 50 % IV SOLN
INTRAVENOUS | Status: AC
  Filled 2020-02-09: qty 50

## 2020-02-09 MED ORDER — DEXTROSE 50 % IV SOLN
25.0000 mL | Freq: Once | INTRAVENOUS | Status: AC
  Administered 2020-02-09: 22:00:00 25 mL via INTRAVENOUS

## 2020-02-09 MED ORDER — DEXTROSE 50 % IV SOLN
25.0000 mL | Freq: Once | INTRAVENOUS | Status: AC
  Administered 2020-02-09: 21:00:00 25 mL via INTRAVENOUS

## 2020-02-09 MED ORDER — ACETAMINOPHEN 650 MG RE SUPP: 650.0000 mg | Freq: Four times a day (QID) | RECTAL | Status: DC | PRN

## 2020-02-09 MED ORDER — SODIUM CHLORIDE 0.9% FLUSH: 3.0000 mL | INTRAVENOUS | Status: DC | PRN

## 2020-02-09 MED ORDER — SODIUM CHLORIDE 0.9% FLUSH
3.0000 mL | Freq: Two times a day (BID) | INTRAVENOUS | Status: DC
  Administered 2020-02-10 – 2020-02-11 (×2): 3 mL via INTRAVENOUS

## 2020-02-09 MED ORDER — ACETAMINOPHEN 325 MG PO TABS: 650.0000 mg | ORAL_TABLET | Freq: Four times a day (QID) | ORAL | Status: DC | PRN

## 2020-02-09 MED ORDER — DEXTROSE-NACL 5-0.9 % IV SOLN: INTRAVENOUS | Status: DC

## 2020-02-09 MED ORDER — GLUCAGON HCL RDNA (DIAGNOSTIC) 1 MG IJ SOLR: 1.0000 mg | Freq: Once | INTRAMUSCULAR | Status: DC

## 2020-02-09 MED ORDER — DEXTROSE 10 % IV SOLN: INTRAVENOUS | Status: DC

## 2020-02-09 NOTE — ED Notes (Signed)
Dr. Rachael Darby messaged in Epic to make aware of CBG of 68.

## 2020-02-09 NOTE — ED Triage Notes (Signed)
Pt IVC'd by [facility] when he refused to come to ED for cbg of 40.  Pt yelling, handcuffed, diaphoretic, attempting to get out of stretcher.

## 2020-02-09 NOTE — ED Provider Notes (Signed)
[facility] EMERGENCY DEPARTMENT Provider Note   CSN: [id number] Arrival date & time: 02/09/20  1703  LEVEL 5 CAVEAT - ACUTE AGITATION History Chief Complaint  Patient presents with  . Hypoglycemia  . Altered Mental Status    [name] is a 34 y.o. male.  HPI 34 year old male presents with altered mental status and agitation.  History is from EMS and police.  Originally called out as hypoglycemia.  Unknown exact number but he was given glucagon.  At some point he started talking about being suicidal and then was agitated and aggressive.  He is currently in handcuffs.  Police IVC'd him. Level 5 caveat.  Past Medical History:  Diagnosis Date  . Anxiety   . Asthma    no prior hospitalizations, intubations  . Depression   . Headache    "only w/stress" (11/01/2017)  . HIV (human immunodeficiency virus infection) (HCC)   . Migraine    "a few/month" (11/01/2017)  . Refusal of blood transfusions as patient is Jehovah's Witness   . Seizure (HCC)    "only w/my low blood sugars"  (11/01/2017)  . Type I diabetes mellitus [facility])     Patient Active Problem List   Diagnosis Date Noted  . Altered mental status 02/09/2020  . Suicidal ideation 02/09/2020  . MDD (major depressive disorder), recurrent episode, severe (HCC) 01/15/2020  . SIRS (systemic inflammatory response syndrome) (HCC)   . Healthcare maintenance 12/05/2019  . Hypoglycemia 03/31/2019  . Syphilis 01/15/2019  . Closed fracture of base of fifth metatarsal bone of right foot 11/13/2017  . Closed displaced comminuted fracture of shaft of right tibia 11/01/2017  . Closed fracture of medial malleolus of right ankle 10/31/2017  . Vaccine counseling 12/14/2016  . Screening examination for venereal disease 06/09/2015  . Encounter for long-term (current) use of medications 06/09/2015  . Depression 03/04/2015  . Dizziness and giddiness 03/04/2015  . Tobacco dependence 11/12/2014  . HIV infection (HCC)  11/11/2014  . Stress 12/19/2012  . Asthma 10/02/2010  . Diabetes mellitus type 1 (HCC) 03/01/1991    Past Surgical History:  Procedure Laterality Date  . FRACTURE SURGERY    . IM NAILING TIBIA Right 11/01/2017   INTRAMEDULLARY (IM) NAIL TIBIALRightGeneral  . ORIF ANKLE FRACTURE Right 11/01/2017  . ORIF ANKLE FRACTURE Right 11/01/2017   Procedure: OPEN REDUCTION INTERNAL FIXATION (ORIF) ANKLE FRACTURE;  Surgeon: Roby Lofts, MD;  Location: [facility] OR;  Service: Orthopedics;  Laterality: Right;  . TIBIA IM NAIL INSERTION Right 11/01/2017   Procedure: INTRAMEDULLARY (IM) NAIL TIBIAL;  Surgeon: Roby Lofts, MD;  Location: [facility] OR;  Service: Orthopedics;  Laterality: Right;       Family History  Problem Relation Age of Onset  . Thyroid disease Mother   . Diabetes Mother   . Breast cancer Maternal Grandmother   . Diabetes Maternal Grandmother     Social History   Tobacco Use  . Smoking status: Current Every Day Smoker    Types: Cigars    Start date: 09/29/2015  . Smokeless tobacco: Never Used  . Tobacco comment: 11/01/2017  "2 cigarettes in ~ 6 month "  Vaping Use  . Vaping Use: Never used  Substance Use Topics  . Alcohol use: Yes    Alcohol/week: 7.0 standard drinks    Types: 7 Cans of beer per week  . Drug use: Not Currently    Frequency: 2.0 times per week    Types: Marijuana    Comment: 11/01/2017  "weekly"  Home Medications Prior to Admission medications   Medication Sig Start Date End Date Taking? Authorizing Provider  insulin lispro (HUMALOG KWIKPEN) 100 UNIT/ML KwikPen Inject 0.1 mLs (10 Units total) into the skin 3 (three) times daily with meals. ICD 10 E10.42 Patient not taking: Reported on 03/31/2019 03/08/19 07/08/19  Claiborne Rigg, NP  pravastatin (PRAVACHOL) 20 MG tablet Take 1 tablet (20 mg total) by mouth daily. Patient not taking: Reported on 03/31/2019 03/08/19 07/08/19  Claiborne Rigg, NP    Allergies    Shellfish allergy and Aspirin  Review of Systems    Review of Systems  Unable to perform ROS: Psychiatric disorder    Physical Exam Updated Vital Signs BP (!) 141/106   Pulse 61   Temp 98.1 F (36.7 C) (Oral)   Resp 17   Ht 6\' 1"  (1.854 m)   Wt 87.5 kg   SpO2 99%   BMI 25.45 kg/m   Physical Exam Vitals and nursing note reviewed.  Constitutional:      Appearance: He is well-developed and well-nourished. He is diaphoretic.  HENT:     Head: Normocephalic and atraumatic.     Right Ear: External ear normal.     Left Ear: External ear normal.     Nose: Nose normal.  Eyes:     General:        Right eye: No discharge.        Left eye: No discharge.  Pulmonary:     Effort: Pulmonary effort is normal.  Abdominal:     General: There is no distension.  Musculoskeletal:        General: No edema.     Cervical back: Neck supple.  Skin:    General: Skin is warm.  Neurological:     Mental Status: He is alert.  Psychiatric:        Mood and Affect: Mood is not anxious.        Behavior: Behavior is agitated and aggressive.     ED Results / Procedures / Treatments   Labs (all labs ordered are listed, but only abnormal results are displayed) Labs Reviewed  BASIC METABOLIC PANEL - Abnormal; Notable for the following components:      Result Value   CO2 21 (*)    Glucose, Bld 64 (*)    Creatinine, Ser 1.31 (*)    All other components within normal limits  CBC WITH DIFFERENTIAL/PLATELET - Abnormal; Notable for the following components:   WBC 13.9 (*)    Neutro Abs 9.7 (*)    All other components within normal limits  CBG MONITORING, ED - Abnormal; Notable for the following components:   Glucose-Capillary 28 (*)    All other components within normal limits  CBG MONITORING, ED - Abnormal; Notable for the following components:   Glucose-Capillary 235 (*)    All other components within normal limits  CBG MONITORING, ED - Abnormal; Notable for the following components:   Glucose-Capillary 120 (*)    All other components within  normal limits  CBG MONITORING, ED - Abnormal; Notable for the following components:   Glucose-Capillary 68 (*)    All other components within normal limits  RESP PANEL BY RT-PCR (FLU A&B, COVID) ARPGX2  ETHANOL  CBC  CREATININE, SERUM  URINALYSIS, ROUTINE W REFLEX MICROSCOPIC  RAPID URINE DRUG SCREEN, HOSP PERFORMED  CBC  BASIC METABOLIC PANEL  CBG MONITORING, ED  CBG MONITORING, ED  CBG MONITORING, ED    EKG None  Radiology No  results found.  Procedures .Critical Care Performed by: Pricilla Loveless, MD Authorized by: Pricilla Loveless, MD   Critical care provider statement:    Critical care time (minutes):  40   Critical care time was exclusive of:  Separately billable procedures and treating other patients   Critical care was necessary to treat or prevent imminent or life-threatening deterioration of the following conditions:  Endocrine crisis   Critical care was time spent personally by me on the following activities:  Discussions with consultants, evaluation of patient's response to treatment, examination of patient, ordering and performing treatments and interventions, ordering and review of laboratory studies, ordering and review of radiographic studies, pulse oximetry, re-evaluation of patient's condition, obtaining history from patient or surrogate and review of old charts   (including critical care time)  Medications Ordered in ED Medications  sodium chloride flush (NS) 0.9 % injection 3 mL (3 mLs Intravenous Not Given 02/09/20 2111)  sodium chloride flush (NS) 0.9 % injection 3 mL (has no administration in time range)  0.9 %  sodium chloride infusion (has no administration in time range)  dextrose 10 % infusion ( Intravenous Rate/Dose Verify 02/09/20 2149)  enoxaparin (LOVENOX) injection 40 mg (40 mg Subcutaneous Given 02/09/20 2156)  acetaminophen (TYLENOL) tablet 650 mg (has no administration in time range)    Or  acetaminophen (TYLENOL) suppository 650 mg (has  no administration in time range)  LORazepam (ATIVAN) injection 1 mg (has no administration in time range)  dextrose 50 % solution 50 mL (50 mLs Intravenous Given 02/09/20 1747)  midazolam (VERSED) injection 4 mg (4 mg Intramuscular Given 02/09/20 1714)  haloperidol lactate (HALDOL) injection 5 mg (5 mg Intramuscular Given 02/09/20 1720)  dextrose 50 % solution 50 mL (50 mLs Intravenous Given 02/09/20 1748)  dextrose 50 % solution 25 mL (25 mLs Intravenous Not Given 02/09/20 2044)  dextrose 50 % solution 25 mL (25 mLs Intravenous Given 02/09/20 2153)  dextrose 50 % solution 50 mL (50 mLs Intravenous Given 02/09/20 2251)    ED Course  I have reviewed the triage vital signs and the nursing notes.  Pertinent labs & imaging results that were available during my care of the patient were reviewed by me and considered in my medical decision making (see chart for details).    MDM Rules/Calculators/A&P                          Patient presents with agitation and aggression and was involuntarily committed by police.  He is awake and able to talk to me though is very frustrated by being handcuffed.  Noted to have an glucose of 28. Given D50, though had a delay due to patient being agitated and requiring IM sedation and then IV access. However glucose has now been stable. He was IVC'd by police, which I have upheld. Will admit for further glucose monitoring. Will need psych consult.  Final Clinical Impression(s) / ED Diagnoses Final diagnoses:  Hypoglycemia  Suicidal ideation  Aggressive behavior    Rx / DC Orders ED Discharge Orders    None       Pricilla Loveless, MD 02/09/20 2345

## 2020-02-09 NOTE — ED Notes (Signed)
Dr. Rachael Darby messaged regarding patient's CBG readings.

## 2020-02-09 NOTE — ED Notes (Signed)
Dr. Criss Alvine made aware of patient's decreasing CBG, orders to be placed for same.

## 2020-02-09 NOTE — H&P (Signed)
History and Physical    Jehiel Alexey Rhoads EXB:[id number] DOB: September 12, 1985 DOA: 02/09/2020  PCP: Patient, No Pcp Per   Patient coming from:  Brought in by EMS and police  Chief Complaint:  Low blood sugar and AMS  HPI: [name] is a 34 y.o. male with medical history significant for DMT1, asthma, HIV, seizure, Depression who presents by EMS and police with altered mental status and agitation.  History is from ER provider. Police and EMS called to evaluate pt who was agitated and altered. He was found to have low blood glucose and was given glucagon by EMS. Exact blood sugar at that time is unknown. Pt had blood sugar of 28 when he arrived in ER. He was talking and agitated upon arrival and combative. He was sedated and given amps of D50.  At some point he started talking about being suicidal and then was agitated and aggressive. He was initially in handcuffs.  Police IVC'd him.He is now sleeping and sedated in restraints.   Review of Systems:  Cannot obtain review of systems secondary to patient being sedated so agitated and combative in the emergency room  Past Medical History:  Diagnosis Date  . Anxiety   . Asthma    no prior hospitalizations, intubations  . Depression   . Headache    "only w/stress" (11/01/2017)  . HIV (human immunodeficiency virus infection) (HCC)   . Migraine    "a few/month" (11/01/2017)  . Refusal of blood transfusions as patient is Jehovah's Witness   . Seizure (HCC)    "only w/my low blood sugars"  (11/01/2017)  . Type I diabetes mellitus (HCC)     Past Surgical History:  Procedure Laterality Date  . FRACTURE SURGERY    . IM NAILING TIBIA Right 11/01/2017   INTRAMEDULLARY (IM) NAIL TIBIALRightGeneral  . ORIF ANKLE FRACTURE Right 11/01/2017  . ORIF ANKLE FRACTURE Right 11/01/2017   Procedure: OPEN REDUCTION INTERNAL FIXATION (ORIF) ANKLE FRACTURE;  Surgeon: Roby Lofts, MD;  Location: [facility] OR;  Service: Orthopedics;  Laterality: Right;  .  TIBIA IM NAIL INSERTION Right 11/01/2017   Procedure: INTRAMEDULLARY (IM) NAIL TIBIAL;  Surgeon: Roby Lofts, MD;  Location: [facility] OR;  Service: Orthopedics;  Laterality: Right;    Social History  reports that he has been smoking cigars. He started smoking about 4 years ago. He has never used smokeless tobacco. He reports current alcohol use of about 7.0 standard drinks of alcohol per week. He reports previous drug use. Frequency: 2.00 times per week. Drug: Marijuana.  Allergies  Allergen Reactions  . Shellfish Allergy Anaphylaxis and Hives  . Aspirin Other (See Comments)    Unknown reaction    Family History  Problem Relation Age of Onset  . Thyroid disease Mother   . Diabetes Mother   . Breast cancer Maternal Grandmother   . Diabetes Maternal Grandmother      Prior to Admission medications   Medication Sig Start Date End Date Taking? Authorizing Provider  insulin lispro (HUMALOG KWIKPEN) 100 UNIT/ML KwikPen Inject 0.1 mLs (10 Units total) into the skin 3 (three) times daily with meals. ICD 10 E10.42 Patient not taking: Reported on 03/31/2019 03/08/19 07/08/19  Claiborne Rigg, NP  pravastatin (PRAVACHOL) 20 MG tablet Take 1 tablet (20 mg total) by mouth daily. Patient not taking: Reported on 03/31/2019 03/08/19 07/08/19  Claiborne Rigg, NP    Physical Exam: Vitals:   02/09/20 1830 02/09/20 1845 02/09/20 1900 02/09/20 1915  BP: 133/87 130/85  130/87 125/88  Pulse: 92 88 87 82  Resp: 19 16 16 15   Temp:      TempSrc:      SpO2:      Weight:  87.5 kg    Height:  6\' 1"  (1.854 m)      Constitutional: NAD, calm, comfortable Vitals:   02/09/20 1830 02/09/20 1845 02/09/20 1900 02/09/20 1915  BP: 133/87 130/85 130/87 125/88  Pulse: 92 88 87 82  Resp: 19 16 16 15   Temp:      TempSrc:      SpO2:      Weight:  87.5 kg    Height:  6\' 1"  (1.854 m)     General: WDWN, Sedated and sleeping male. No acute respiratory distress.  Eyes:   PERRL, conjunctivae normal.  Sclera  nonicteric HENT:  [place]/AT, external ears normal.  Nares patent without epistasis.  Mucous membranes are moist. Posterior pharynx clear of any exudate or lesions.  Neck: Soft, normal passive range of motion, supple, no masses, no thyromegaly. Trachea midline Respiratory: clear to auscultation bilaterally, no wheezing, no crackles. Normal respiratory effort. No accessory muscle use.  Cardiovascular: Regular rate and rhythm, no murmurs / rubs / gallops. No extremity edema. 2+ pedal pulses. Abdomen: Soft, nondistended, no rebound or guarding.  No masses palpated. Bowel sounds normoactive Musculoskeletal:  Extremities in restraints on gurney in Er. no clubbing / cyanosis. No joint deformity upper and lower extremities. Normal muscle tone.  Skin: Warm, dry, intact no rashes, lesions, ulcers. No induration Neurologic: Cannot be fulled assessed due to sedation.  Babinski downgoing bilaterally.     Labs on Admission: I have personally reviewed following labs and imaging studies  CBC: Recent Labs  Lab 02/09/20 1740  WBC 13.9*  NEUTROABS 9.7*  HGB 15.4  HCT 45.7  MCV 92.9  PLT 267    Basic Metabolic Panel: Recent Labs  Lab 02/09/20 1740  NA 139  K 4.3  CL 105  CO2 21*  GLUCOSE 64*  BUN 13  CREATININE 1.31*  CALCIUM 9.8    GFR: Estimated Creatinine Clearance: 90.6 mL/min (A) (by C-G formula based on SCr of 1.31 mg/dL (H)).  Liver Function Tests: No results for input(s): AST, ALT, ALKPHOS, BILITOT, PROT, ALBUMIN in the last 168 hours.  Urine analysis:    Component Value Date/Time   COLORURINE YELLOW 01/13/2020 0143   APPEARANCEUR CLEAR 01/13/2020 0143   LABSPEC 1.026 01/13/2020 0143   PHURINE 6.0 01/13/2020 0143   GLUCOSEU 150 (A) 01/13/2020 0143   HGBUR NEGATIVE 01/13/2020 0143   BILIRUBINUR NEGATIVE 01/13/2020 0143   KETONESUR 20 (A) 01/13/2020 0143   PROTEINUR >=300 (A) 01/13/2020 0143   UROBILINOGEN 0.2 02/10/2015 1416   NITRITE NEGATIVE 01/13/2020 0143   LEUKOCYTESUR  NEGATIVE 01/13/2020 0143    Radiological Exams on Admission: No results found.   Assessment/Plan Active Problems:   Hypoglycemia Mr. Schaumburg is brought in by EMS and police after being IVC by police.  Patient initial blood sugar was 28.  Even amps of D50 and is on D10 IV fluid at this time.  Blood sugar is now increased to greater than 100. Continue D5NS and monitor blood sugar closely.     Diabetes mellitus type 1 (HCC) Home medications are unknown at this time and will be held while patient is being treated for hypoglycemia    Altered mental status Patient with altered mental status and confusion that needed to be sedated in the emergency room.  Patient was very agitated  and combative with staff.  Patient is now sleeping comfortably. UDS and alcohol level was less than 10.    Suicidal ideation Patient made statements of wanting to kill himself and not wanting to live while in the emergency room and when EMS and police 1st encountered the patient.  Patient has history of suicidal ideation and psychiatric admissions in the past. Patient will need psychiatric evaluation once glycemia is resolved and patient is medically stable    DVT prophylaxis: Lovenox for DVT prophylaxis Code Status:   Full code  Family Communication:  No family at bedside Disposition Plan:   Patient is from:  Pt brought in by EMS, IVC'd by police  Anticipated DC to:  To be determined after medically stable and evaluated by Psychiatry  Anticipated DC date:  Anticipate more than 2 midnight stay in hospital.  Anticipated DC barriers: No barriers to discharge identified at this time  Admission status:  Inpatient   Claudean Severance Elianna Windom MD [facility]  How to contact the [facility] Attending or Consulting provider 7A - 7P or covering provider during after hours 7P -7A, for this patient?   1. Check the care team in [facility] and look for a) attending/consulting [facility] provider listed and b) the [facility] team listed 2. Log into  [email or web address] and use [place] universal password to access. If you do not have the password, please contact the hospital operator. 3. Locate the [facility] provider you are looking for under [facility] and page to a number that you can be directly reached. 4. If you still have difficulty reaching the provider, please page the [facility] (Director on Call) for the Hospitalists listed on amion for assistance.  02/09/2020, 8:42 PM

## 2020-02-09 NOTE — ED Notes (Addendum)
Patient woke up and spoke with this RN. Patient calm and cooperative at this time. Patient reassured and updated on plan of care. Vitals stable. Patient remains drowsy and quickly falls back asleep. Patient made aware of need for urine specimen and provider wanting cath performed if unable to provide same, patient shook his head "no". Reinforced need for sample, patient verbalized understanding but states he does not have to void right now.

## 2020-02-10 DIAGNOSIS — E162 Hypoglycemia, unspecified: Secondary | ICD-10-CM

## 2020-02-10 DIAGNOSIS — R45851 Suicidal ideations: Secondary | ICD-10-CM

## 2020-02-10 DIAGNOSIS — R4689 Other symptoms and signs involving appearance and behavior: Secondary | ICD-10-CM

## 2020-02-10 LAB — GLUCOSE, CAPILLARY
Glucose-Capillary: 170 mg/dL — ABNORMAL HIGH (ref 70–99)
Glucose-Capillary: 296 mg/dL — ABNORMAL HIGH (ref 70–99)
Glucose-Capillary: 296 mg/dL — ABNORMAL HIGH (ref 70–99)

## 2020-02-10 LAB — CBC
HCT: 41.1 % (ref 39.0–52.0)
Hemoglobin: 14.1 g/dL (ref 13.0–17.0)
MCH: 31.9 pg (ref 26.0–34.0)
MCHC: 34.3 g/dL (ref 30.0–36.0)
MCV: 93 fL (ref 80.0–100.0)
Platelets: 222 10*3/uL (ref 150–400)
RBC: 4.42 MIL/uL (ref 4.22–5.81)
RDW: 13.4 % (ref 11.5–15.5)
WBC: 7.5 10*3/uL (ref 4.0–10.5)
nRBC: 0 % (ref 0.0–0.2)

## 2020-02-10 LAB — BASIC METABOLIC PANEL
Anion gap: 8 (ref 5–15)
BUN: 10 mg/dL (ref 6–20)
CO2: 24 mmol/L (ref 22–32)
Calcium: 9.1 mg/dL (ref 8.9–10.3)
Chloride: 103 mmol/L (ref 98–111)
Creatinine, Ser: 1.12 mg/dL (ref 0.61–1.24)
GFR, Estimated: >60 mL/min (ref >60)
Glucose, Bld: 82 mg/dL (ref 70–99)
Potassium: 3.8 mmol/L (ref 3.5–5.1)
Sodium: 135 mmol/L (ref 135–145)

## 2020-02-10 LAB — CBG MONITORING, ED
Glucose-Capillary: 100 mg/dL — ABNORMAL HIGH (ref 70–99)
Glucose-Capillary: 101 mg/dL — ABNORMAL HIGH (ref 70–99)
Glucose-Capillary: 108 mg/dL — ABNORMAL HIGH (ref 70–99)
Glucose-Capillary: 114 mg/dL — ABNORMAL HIGH (ref 70–99)
Glucose-Capillary: 130 mg/dL — ABNORMAL HIGH (ref 70–99)
Glucose-Capillary: 132 mg/dL — ABNORMAL HIGH (ref 70–99)
Glucose-Capillary: 145 mg/dL — ABNORMAL HIGH (ref 70–99)
Glucose-Capillary: 166 mg/dL — ABNORMAL HIGH (ref 70–99)
Glucose-Capillary: 171 mg/dL — ABNORMAL HIGH (ref 70–99)
Glucose-Capillary: 184 mg/dL — ABNORMAL HIGH (ref 70–99)
Glucose-Capillary: 55 mg/dL — ABNORMAL LOW (ref 70–99)
Glucose-Capillary: 68 mg/dL — ABNORMAL LOW (ref 70–99)

## 2020-02-10 LAB — RAPID URINE DRUG SCREEN, HOSP PERFORMED
Amphetamines: NOT DETECTED
Barbiturates: NOT DETECTED
Benzodiazepines: POSITIVE — AB
Cocaine: NOT DETECTED
Opiates: NOT DETECTED
Tetrahydrocannabinol: POSITIVE — AB

## 2020-02-10 LAB — URINALYSIS, ROUTINE W REFLEX MICROSCOPIC
Bacteria, UA: NONE SEEN
Bilirubin Urine: NEGATIVE
Glucose, UA: >=500 mg/dL — AB
Hgb urine dipstick: NEGATIVE
Ketones, ur: NEGATIVE mg/dL
Leukocytes,Ua: NEGATIVE
Nitrite: NEGATIVE
Protein, ur: 100 mg/dL — AB
Specific Gravity, Urine: 1.009 (ref 1.005–1.030)
pH: 5 (ref 5.0–8.0)

## 2020-02-10 MED ORDER — INSULIN ASPART 100 UNIT/ML ~~LOC~~ SOLN
3.0000 [IU] | Freq: Three times a day (TID) | SUBCUTANEOUS | Status: DC
  Administered 2020-02-11: 3 [IU] via SUBCUTANEOUS

## 2020-02-10 MED ORDER — DEXTROSE 50 % IV SOLN: 1.0000 | Freq: Once | INTRAVENOUS | Status: AC

## 2020-02-10 MED ORDER — INSULIN GLARGINE 100 UNIT/ML ~~LOC~~ SOLN
10.0000 [IU] | Freq: Every day | SUBCUTANEOUS | Status: DC
  Administered 2020-02-10: 10 [IU] via SUBCUTANEOUS
  Filled 2020-02-10 (×3): qty 0.1

## 2020-02-10 MED ORDER — AMLODIPINE BESYLATE 10 MG PO TABS
10.0000 mg | ORAL_TABLET | Freq: Every day | ORAL | Status: DC
Start: 2020-02-10 — End: 2020-02-11
  Administered 2020-02-10 – 2020-02-11 (×2): 10 mg via ORAL
  Filled 2020-02-10: qty 2
  Filled 2020-02-10: qty 1

## 2020-02-10 MED ORDER — DEXTROSE 50 % IV SOLN
INTRAVENOUS | Status: AC
  Filled 2020-02-10: qty 50

## 2020-02-10 MED ORDER — DEXTROSE 50 % IV SOLN
1.0000 | Freq: Once | INTRAVENOUS | Status: AC
  Administered 2020-02-10: 02:00:00 50 mL via INTRAVENOUS

## 2020-02-10 MED ORDER — DEXTROSE 50 % IV SOLN
INTRAVENOUS | Status: AC
  Administered 2020-02-10: 50 mL via INTRAVENOUS
  Filled 2020-02-10: qty 50

## 2020-02-10 NOTE — ED Notes (Signed)
Pt up to use restroom with sitter. When pt stood up cell phone found on bed. This RN explained to pt that he could not have his phone with him since he is here for SI.   Cell phone inventoried and secured in security.

## 2020-02-10 NOTE — ED Notes (Signed)
Urine collected via clean catch

## 2020-02-10 NOTE — ED Notes (Addendum)
Pt on phone in room, pt told he cannot be on phone, pt started raising voice and getting loud. Pt continued conversation. Eventually got off of phone. Removed from room. Pt tearful, given tissues.  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

## 2020-02-10 NOTE — ED Notes (Signed)
Patient awakened stating he has to use the bathroom, patient refused to use urinal in bed and started to become agitated when staff insisted on same. Patient stood in room to use urinal with staff present x 2. Tolerated well. Patient assisted with changing into purple scrubs. Patient offered PO fluids, he declined same. Patient resting in bed, allowing staff to check CBG/perform care.

## 2020-02-10 NOTE — ED Notes (Signed)
Pt denies SI/HI, denies hallucinations. Denies any alcohol use since last admission to hospital. This RN put grip socks on pt. Sitter watching pt.

## 2020-02-10 NOTE — Progress Notes (Signed)
Inpatient Diabetes Program Recommendations  AACE/ADA: New Consensus Statement on Inpatient Glycemic Control (2015)  Target Ranges:  Prepandial:   less than 140 mg/dL      Peak postprandial:   less than 180 mg/dL (1-2 hours)      Critically ill patients:  140 - 180 mg/dL   Lab Results  Component Value Date   GLUCAP 171 (H) 02/10/2020   HGBA1C 7.8 (H) 12/06/2019    Review of Glycemic Control Results for BRANDEN, SHALLENBERGER (MRN [id number]) as of 02/10/2020 09:47  Ref. Range 02/10/2020 04:21 02/10/2020 05:33 02/10/2020 06:31 02/10/2020 07:28 02/10/2020 08:32  Glucose-Capillary Latest Ref Range: 70 - 99 mg/dL 259 (H) 563 (H) 875 (H) 145 (H) 171 (H)   Diabetes history: DM1 Outpatient Diabetes medications: Lantus 26 units + Novolog 5 units tid meal coverage Current orders for Inpatient glycemic control:None  Inpatient Diabetes Program Recommendations:   Patient was admitted with hypoglycemia 01/12/20 and discharged to inpatient behavioral health 01/15/20 on new reduced regimen. Noted patient presents with hypoglycemia. Once trends begin to increase, will need to add basal insulin.  Thank you, Billy Fischer. Eunice Oldaker, RN, MSN, CDE  Diabetes Coordinator Inpatient Glycemic Control Team Team Pager [phone] (8am-5pm) 02/10/2020 9:55 AM

## 2020-02-10 NOTE — ED Notes (Signed)
Patient removed BP cuff, RN reapplied.

## 2020-02-10 NOTE — Plan of Care (Signed)

## 2020-02-10 NOTE — Progress Notes (Addendum)
PROGRESS NOTE    [name]  QPR:[id number] DOB: August 19, 1985 DOA: 02/09/2020 PCP: Patient, No Pcp Per  Brief Narrative: 34 year old male with type 1 diabetes mellitus, HIV, depression was brought to the ED by EMS, he was IVC by EMTs when he refused to come to the ED for hypoglycemia, he was reportedly yelling agitated and diaphoretic.  Reportedly police and EMS were called to evaluate patient at work as he was altered and agitated, was found to have very low blood sugars given glucagon by EMS, CBG was 28 when he arrived in the ER, was agitated and combative upon arrival, given multiple amps of D50 and Haldol for sedation.  -Started on a D10 drip due to recurrent hypoglycemia in the ED    Assessment & Plan:   Recurrent hypoglycemia -Improving, wean off D10 drip gradually today -Unwilling to share information about when he took his insulin last and whether he ate his meals yesterday, as he is extremely upset and angry -Wean off D10 -Will need to restart long-acting insulin at some point as he is a type I diabetic -Diabetes coordinator consult  Agitation, combativeness -Patient denies suicidal ideations, however this is documented in the ED notes -IVC was completed by EMTs prior to arrival in the ED when he was hypoglycemic -He is awake and alert but extremely angry and refuses to discuss anything -Will request psych consult, continue one-to-one sitter  HIV -Last CD4 count was 641 and viral load was less than 20 in September -Restart Genvoya when patient is agreeable  DVT prophylaxis: Lovenox Code Status: Full code Family Communication: No family at bedside Disposition Plan:  Status is: Inpatient  Remains inpatient appropriate because:Inpatient level of care appropriate due to severity of illness   Dispo: The patient is from: Home              Anticipated d/c is to: Home              Anticipated d/c date is: pending psych clearance              Patient currently is  not medically stable to d/c.  Consultants: psych   Procedures:   Antimicrobials:    Subjective: -Angry and upset, sitting up in chair, just refuses to discuss anything  Objective: Vitals:   02/10/20 0915 02/10/20 1223 02/10/20 1224 02/10/20 1409  BP:  (!) 145/106 (!) 153/102 (!) 157/106  Pulse: 66 68  75  Resp: 17 16  18   Temp:  98.7 F (37.1 C)  98.7 F (37.1 C)  TempSrc:  Oral  Oral  SpO2: 100% 100%  100%  Weight:      Height:        Intake/Output Summary (Last 24 hours) at 02/10/2020 1433 Last data filed at 02/10/2020 0533 Gross per 24 hour  Intake --  Output 1000 ml  Net -1000 ml   Filed Weights   02/09/20 1845  Weight: 87.5 kg    Examination:   General exam: Awake alert, sitting in a chair, refused to answer questions CVS: S1-S2, regular rate rhythm Lungs: Clear Abdomen: Deferred Extremities: No edema, deferred Skin Psychiatry: Angry    Data Reviewed:   CBC: Recent Labs  Lab 02/09/20 1740 02/09/20 2117 02/10/20 0316  WBC 13.9* 7.8 7.5  NEUTROABS 9.7*  --   --   HGB 15.4 14.6 14.1  HCT 45.7 43.9 41.1  MCV 92.9 93.6 93.0  PLT 267 241 222   Basic Metabolic Panel: Recent Labs  Lab 02/09/20  1740 02/09/20 2117 02/10/20 0316  NA 139  --  135  K 4.3  --  3.8  CL 105  --  103  CO2 21*  --  24  GLUCOSE 64*  --  82  BUN 13  --  10  CREATININE 1.31* 1.03 1.12  CALCIUM 9.8  --  9.1   GFR: Estimated Creatinine Clearance: 106 mL/min (by C-G formula based on SCr of 1.12 mg/dL). Liver Function Tests: No results for input(s): AST, ALT, ALKPHOS, BILITOT, PROT, ALBUMIN in the last 168 hours. No results for input(s): LIPASE, AMYLASE in the last 168 hours. No results for input(s): AMMONIA in the last 168 hours. Coagulation Profile: No results for input(s): INR, PROTIME in the last 168 hours. Cardiac Enzymes: No results for input(s): CKTOTAL, CKMB, CKMBINDEX, TROPONINI in the last 168 hours. BNP (last 3 results) No results for input(s):  PROBNP in the last 8760 hours. HbA1C: No results for input(s): HGBA1C in the last 72 hours. CBG: Recent Labs  Lab 02/10/20 0631 02/10/20 0728 02/10/20 0832 02/10/20 1051 02/10/20 1218  GLUCAP 132* 145* 171* 184* 166*   Lipid Profile: No results for input(s): CHOL, HDL, LDLCALC, TRIG, CHOLHDL, LDLDIRECT in the last 72 hours. Thyroid Function Tests: No results for input(s): TSH, T4TOTAL, FREET4, T3FREE, THYROIDAB in the last 72 hours. Anemia Panel: No results for input(s): VITAMINB12, FOLATE, FERRITIN, TIBC, IRON, RETICCTPCT in the last 72 hours. Urine analysis:    Component Value Date/Time   COLORURINE YELLOW 02/10/2020 0543   APPEARANCEUR CLEAR 02/10/2020 0543   LABSPEC 1.009 02/10/2020 0543   PHURINE 5.0 02/10/2020 0543   GLUCOSEU >=500 (A) 02/10/2020 0543   HGBUR NEGATIVE 02/10/2020 0543   BILIRUBINUR NEGATIVE 02/10/2020 0543   KETONESUR NEGATIVE 02/10/2020 0543   PROTEINUR 100 (A) 02/10/2020 0543   UROBILINOGEN 0.2 02/10/2015 1416   NITRITE NEGATIVE 02/10/2020 0543   LEUKOCYTESUR NEGATIVE 02/10/2020 0543   Sepsis Labs: @LABRCNTIP (procalcitonin:4,lacticidven:4)  ) Recent Results (from the past 240 hour(s))  Resp Panel by RT-PCR (Flu A&B, Covid) Nasopharyngeal Swab     Status: None   Collection Time: 02/09/20  7:47 PM   Specimen: Nasopharyngeal Swab; Nasopharyngeal(NP) swabs in vial transport medium  Result Value Ref Range Status   SARS Coronavirus 2 by RT PCR NEGATIVE NEGATIVE Final    Comment: (NOTE) SARS-CoV-2 target nucleic acids are NOT DETECTED.  The SARS-CoV-2 RNA is generally detectable in upper respiratory specimens during the acute phase of infection. The lowest concentration of SARS-CoV-2 viral copies this assay can detect is 138 copies/mL. A negative result does not preclude SARS-Cov-2 infection and should not be used as the sole basis for treatment or other patient management decisions. A negative result may occur with  improper specimen  collection/handling, submission of specimen other than nasopharyngeal swab, presence of viral mutation(s) within the areas targeted by this assay, and inadequate number of viral copies(<138 copies/mL). A negative result must be combined with clinical observations, patient history, and epidemiological information. The expected result is Negative.  Fact Sheet for Patients:  14/12/21  Fact Sheet for Healthcare Providers:  BloggerCourse.com  This test is no t yet approved or cleared by the [place] FDA and  has been authorized for detection and/or diagnosis of SARS-CoV-2 by FDA under an Emergency Use Authorization (EUA). This EUA will remain  in effect (meaning this test can be used) for the duration of the COVID-19 declaration under Section 564(b)(1) of the Act, 21 U.S.C.section 360bbb-3(b)(1), unless the authorization is terminated  or revoked  sooner.       Influenza A by PCR NEGATIVE NEGATIVE Final   Influenza B by PCR NEGATIVE NEGATIVE Final    Comment: (NOTE) The Xpert Xpress SARS-CoV-2/FLU/RSV plus assay is intended as an aid in the diagnosis of influenza from Nasopharyngeal swab specimens and should not be used as a sole basis for treatment. Nasal washings and aspirates are unacceptable for Xpert Xpress SARS-CoV-2/FLU/RSV testing.  Fact Sheet for Patients: BloggerCourse.com  Fact Sheet for Healthcare Providers: SeriousBroker.it  This test is not yet approved or cleared by the [place] FDA and has been authorized for detection and/or diagnosis of SARS-CoV-2 by FDA under an Emergency Use Authorization (EUA). This EUA will remain in effect (meaning this test can be used) for the duration of the COVID-19 declaration under Section 564(b)(1) of the Act, 21 U.S.C. section 360bbb-3(b)(1), unless the authorization is terminated or revoked.  Performed at [facility], [address] [place], [place] [address]          Radiology Studies: No results found.      Scheduled Meds: . amLODipine  10 mg Oral Daily  . enoxaparin (LOVENOX) injection  40 mg Subcutaneous Q24H  . sodium chloride flush  3 mL Intravenous Q12H   Continuous Infusions: . sodium chloride    . dextrose 50 mL/hr at 02/10/20 1241     LOS: 1 day    Time spent:  Zannie Cove, MD [facility]  02/10/2020, 2:33 PM

## 2020-02-10 NOTE — ED Notes (Signed)
Meal tray at bedside, pt not wanting to eat at this time. Sitter continues to be at bedside.

## 2020-02-10 NOTE — Plan of Care (Signed)
  Problem: Education: ?Goal: Utilization of techniques to improve thought processes will improve ?Outcome: Not Progressing ?Goal: Knowledge of the prescribed therapeutic regimen will improve ?Outcome: Not Progressing ?  ?Problem: Activity: ?Goal: Interest or engagement in leisure activities will improve ?Outcome: Not Progressing ?Goal: Imbalance in normal sleep/wake cycle will improve ?Outcome: Not Progressing ?  ?Problem: Coping: ?Goal: Coping ability will improve ?Outcome: Not Progressing ?Goal: Will verbalize feelings ?Outcome: Not Progressing ?  ?Problem: Health Behavior/Discharge Planning: ?Goal: Ability to make decisions will improve ?Outcome: Not Progressing ?Goal: Compliance with therapeutic regimen will improve ?Outcome: Not Progressing ?  ?Problem: Role Relationship: ?Goal: Will demonstrate positive changes in social behaviors and relationships ?Outcome: Not Progressing ?  ?Problem: Safety: ?Goal: Ability to disclose and discuss suicidal ideas will improve ?Outcome: Not Progressing ?Goal: Ability to identify and utilize support systems that promote safety will improve ?Outcome: Not Progressing ?  ?Problem: Self-Concept: ?Goal: Will verbalize positive feelings about self ?Outcome: Not Progressing ?Goal: Level of anxiety will decrease ?Outcome: Not Progressing ?  ?

## 2020-02-10 NOTE — Progress Notes (Signed)
Per nurse manger patient is allowed cell phone, phone returned to patient.  Patient is hiding under the covers and refusing admission questions and assessment

## 2020-02-10 NOTE — ED Notes (Signed)
Belongings bag x 3 placed in small locker #1

## 2020-02-10 NOTE — ED Notes (Signed)
Admitting at bedside 

## 2020-02-11 ENCOUNTER — Ambulatory Visit: Payer: Self-pay

## 2020-02-11 ENCOUNTER — Other Ambulatory Visit (HOSPITAL_COMMUNITY): Payer: Self-pay | Admitting: Internal Medicine

## 2020-02-11 DIAGNOSIS — R4689 Other symptoms and signs involving appearance and behavior: Secondary | ICD-10-CM

## 2020-02-11 DIAGNOSIS — E1021 Type 1 diabetes mellitus with diabetic nephropathy: Secondary | ICD-10-CM

## 2020-02-11 LAB — GLUCOSE, CAPILLARY
Glucose-Capillary: 252 mg/dL — ABNORMAL HIGH (ref 70–99)
Glucose-Capillary: 259 mg/dL — ABNORMAL HIGH (ref 70–99)
Glucose-Capillary: 97 mg/dL (ref 70–99)
Glucose-Capillary: 278 mg/dL — ABNORMAL HIGH (ref 70–99)

## 2020-02-11 LAB — CBC
HCT: 47.9 % (ref 39.0–52.0)
Hemoglobin: 16.4 g/dL (ref 13.0–17.0)
MCH: 31.4 pg (ref 26.0–34.0)
MCHC: 34.2 g/dL (ref 30.0–36.0)
MCV: 91.8 fL (ref 80.0–100.0)
Platelets: 267 10*3/uL (ref 150–400)
RBC: 5.22 MIL/uL (ref 4.22–5.81)
RDW: 12.9 % (ref 11.5–15.5)
WBC: 6.9 10*3/uL (ref 4.0–10.5)
nRBC: 0 % (ref 0.0–0.2)

## 2020-02-11 LAB — BASIC METABOLIC PANEL
Anion gap: 14 (ref 5–15)
BUN: 11 mg/dL (ref 6–20)
CO2: 25 mmol/L (ref 22–32)
Calcium: 10 mg/dL (ref 8.9–10.3)
Chloride: 98 mmol/L (ref 98–111)
Creatinine, Ser: 1.37 mg/dL — ABNORMAL HIGH (ref 0.61–1.24)
GFR, Estimated: >60 mL/min (ref >60)
Glucose, Bld: 249 mg/dL — ABNORMAL HIGH (ref 70–99)
Potassium: 4.6 mmol/L (ref 3.5–5.1)
Sodium: 137 mmol/L (ref 135–145)

## 2020-02-11 LAB — HEMOGLOBIN A1C
Hgb A1c MFr Bld: 8.8 % — ABNORMAL HIGH (ref 4.8–5.6)
Mean Plasma Glucose: 205.86 mg/dL

## 2020-02-11 MED ORDER — INSULIN ASPART 100 UNIT/ML ~~LOC~~ SOLN: 5.0000 [IU] | Freq: Three times a day (TID) | SUBCUTANEOUS | 1 refills | Status: DC

## 2020-02-11 MED ORDER — INSULIN GLARGINE 100 UNIT/ML ~~LOC~~ SOLN: 26.0000 [IU] | Freq: Every day | SUBCUTANEOUS | 1 refills | Status: DC

## 2020-02-11 MED ORDER — INSULIN ASPART 100 UNIT/ML ~~LOC~~ SOLN: 5.0000 [IU] | Freq: Three times a day (TID) | SUBCUTANEOUS | 0 refills | Status: DC

## 2020-02-11 MED ORDER — ELVITEG-COBIC-EMTRICIT-TENOFAF 150-150-200-10 MG PO TABS
1.0000 | ORAL_TABLET | Freq: Every day | ORAL | Status: DC
  Filled 2020-02-11: qty 1

## 2020-02-11 MED ORDER — BLOOD GLUCOSE METER KIT: PACK | 0 refills | Status: DC

## 2020-02-11 MED ORDER — INSULIN ASPART 100 UNIT/ML ~~LOC~~ SOLN
5.0000 [IU] | Freq: Three times a day (TID) | SUBCUTANEOUS | Status: DC
  Administered 2020-02-11: 5 [IU] via SUBCUTANEOUS

## 2020-02-11 MED ORDER — INSULIN ASPART 100 UNIT/ML ~~LOC~~ SOLN
0.0000 [IU] | Freq: Three times a day (TID) | SUBCUTANEOUS | Status: DC
  Administered 2020-02-11: 5 [IU] via SUBCUTANEOUS

## 2020-02-11 MED ORDER — INSULIN GLARGINE 100 UNIT/ML ~~LOC~~ SOLN
25.0000 [IU] | Freq: Every day | SUBCUTANEOUS | Status: DC
  Filled 2020-02-11: qty 0.25

## 2020-02-11 MED ORDER — INSULIN GLARGINE 100 UNIT/ML ~~LOC~~ SOLN: 26.0000 [IU] | Freq: Every day | SUBCUTANEOUS | 0 refills | Status: DC

## 2020-02-11 MED FILL — TRUEplus LANCETS 28G MISC: 30 days supply | Qty: 200 | Fill #0

## 2020-02-11 MED FILL — NovoLOG 100 UNIT/ML SOLN: 100 | 30 days supply | Qty: 10 | Fill #0

## 2020-02-11 MED FILL — ULTICARE INS 0.5 ML 30GX1/2: 30G X 1/2" | 30 days supply | Qty: 120 | Fill #0

## 2020-02-11 MED FILL — TRUE METRIX BLOOD GLUCOSE M: W/DEVICE | 1 days supply | Qty: 1 | Fill #0

## 2020-02-11 MED FILL — LANTUS 100 UNITS/ML VIAL: 100 | 30 days supply | Qty: 10 | Fill #0

## 2020-02-11 MED FILL — TRUE METRIX GLUCOSE TEST ST: 30 days supply | Qty: 150 | Fill #0

## 2020-02-11 NOTE — TOC Progression Note (Addendum)
Transition of Care [facility]) - Progression Note    Patient Details  Name: Jeramie Scogin MRN: [id number] Date of Birth: [birth year]  Transition of [facility]) CM/SW Contact  Beckie Busing, RN Phone Number: [phone]  02/11/2020, 4:15 PM  Clinical Narrative:    MD messaged CM to make aware that patient no longer needs IVC. Forms to recall ivc have been completed and faxed to the magistrate from 3 different units lines are currently down and forms will be faxed later. MD has been made aware. MD requesting that diabetic supplies be supplies for patient. TOC pharmacy has been notified and scripts have been faxed. CM unable to complete MATCH form for meds. Sidney Ace RN CM has been made aware and will reinsate MATCH for assistance with meds. CM has verified with [facility] pharmacy that there is no copay needed to supply patient with diabetic supplies. Bedside nurse updated.        Expected Discharge Plan and Services           Expected Discharge Date: 02/11/20                                     Social Determinants of Health (SDOH) Interventions    Readmission Risk Interventions Readmission Risk Prevention Plan 01/15/2020  Transportation Screening Complete  PCP or Specialist Appt within 5-7 Days Complete  Home Care Screening Complete  Medication Review (RN CM) Complete  Some recent data might be hidden

## 2020-02-11 NOTE — Consult Note (Signed)
[facility] Face-to-Face Psychiatry Consult   Reason for Consult:  Suicidal ideations Referring Physician:  Dr. Jomarie Longs Patient Identification: [name] MRN:  [id number] Principal Diagnosis: <principal problem not specified> Diagnosis:  Active Problems:   Diabetes mellitus type 1 (HCC)   Hypoglycemia   Altered mental status   Suicidal ideation   Aggressive behavior   Total Time spent with patient: 30 minutes  Subjective:   [name] is a 34 y.o. male patient admitted with hypoglycemia.  Patient with history of HIV, uncontrolled depression, insulin-dependent diabetes who presents today with some depression. Psych consult was placed for agitation and IVC by EMS. Patient reports a history of aggression and agitation when he becomes hypoglycemic. Today he states he feels better and looking forward to going home. He does endorse some depressive symptoms and expresses interest in resuming his previous depression medication citalopram  He endorses depressive symptoms that include sadness, isolation, worthlessness, anhedonia.  As a result of his depression, his sugars remain uncontrolled and results in poor glycemic management. Patient denies any previous suicide attempts.   HPI:  34 year old male with type 1 diabetes mellitus, HIV, depression was brought to the ED by EMS, he was IVC by EMTs when he refused to come to the ED for hypoglycemia, he was reportedly yelling agitated and diaphoretic.  Reportedly police and EMS were called to evaluate patient at work as he was altered and agitated, was found to have very low blood sugars given glucagon by EMS, CBG was 28 when he arrived in the ER, was agitated and combative upon arrival, given multiple amps of D50 and Haldol for sedation.  -Started on a D10 drip due to recurrent hypoglycemia in the ED   During the evaluation he is alert and oriented, calm and cooperative.  He is observed to be lying in bed, and is easily aroused by calling  his name. He is observed to sit up in the bed appropriately and acknowledge the presence of this Clinical research associate. Patient did make some eye contact, and engaged in the conversation with this Clinical research associate. He denies any suicidal ideations, but endorses some symptoms of depression. He is able to show some insight into his depression, by expressing the need to start medication and seek therapy.   Past Psychiatric History: Depression and anxiety.  Patient is a poor historian, and unable to identify previous psychiatrist and or previous medications.  He reports he has seen multiple psychiatrists, however has managed to elude inpatient admission.  He denies any previous inpatient admission.  He also denies any previous suicide attempts   Risk to Self:  Yes Risk to Others:  Denies Prior Inpatient Therapy:  Denies Prior Outpatient Therapy:  Yes unable to recall at this time.  Past Medical History:  Past Medical History:  Diagnosis Date  . Anxiety   . Asthma    no prior hospitalizations, intubations  . Depression   . Headache    "only w/stress" (11/01/2017)  . HIV (human immunodeficiency virus infection) (HCC)   . Migraine    "a few/month" (11/01/2017)  . Refusal of blood transfusions as patient is Jehovah's Witness   . Seizure (HCC)    "only w/my low blood sugars"  (11/01/2017)  . Type I diabetes mellitus (HCC)     Past Surgical History:  Procedure Laterality Date  . FRACTURE SURGERY    . IM NAILING TIBIA Right 11/01/2017   INTRAMEDULLARY (IM) NAIL TIBIALRightGeneral  . ORIF ANKLE FRACTURE Right 11/01/2017  . ORIF ANKLE FRACTURE Right 11/01/2017  Procedure: OPEN REDUCTION INTERNAL FIXATION (ORIF) ANKLE FRACTURE;  Surgeon: Roby Lofts, MD;  Location: [facility] OR;  Service: Orthopedics;  Laterality: Right;  . TIBIA IM NAIL INSERTION Right 11/01/2017   Procedure: INTRAMEDULLARY (IM) NAIL TIBIAL;  Surgeon: Roby Lofts, MD;  Location: [facility] OR;  Service: Orthopedics;  Laterality: Right;   Family History:  Family  History  Problem Relation Age of Onset  . Thyroid disease Mother   . Diabetes Mother   . Breast cancer Maternal Grandmother   . Diabetes Maternal Grandmother    Family Psychiatric  History: Denies Social History:  Social History   Substance and Sexual Activity  Alcohol Use Yes  . Alcohol/week: 7.0 standard drinks  . Types: 7 Cans of beer per week     Social History   Substance and Sexual Activity  Drug Use Not Currently  . Frequency: 2.0 times per week  . Types: Marijuana   Comment: 11/01/2017  "weekly"    Social History   Socioeconomic History  . Marital status: Single    Spouse name: Not on file  . Number of children: Not on file  . Years of education: Not on file  . Highest education level: Not on file  Occupational History  . Not on file  Tobacco Use  . Smoking status: Current Every Day Smoker    Types: Cigars    Start date: 09/29/2015  . Smokeless tobacco: Never Used  . Tobacco comment: 11/01/2017  "2 cigarettes in ~ 6 month "  Vaping Use  . Vaping Use: Never used  Substance and Sexual Activity  . Alcohol use: Yes    Alcohol/week: 7.0 standard drinks    Types: 7 Cans of beer per week  . Drug use: Not Currently    Frequency: 2.0 times per week    Types: Marijuana    Comment: 11/01/2017  "weekly"  . Sexual activity: Yes    Partners: Male    Comment: pt. declined condoms  Other Topics Concern  . Not on file  Social History Narrative  . Not on file   Social Determinants of Health   Financial Resource Strain: Not on file  Food Insecurity: Not on file  Transportation Needs: Not on file  Physical Activity: Not on file  Stress: Not on file  Social Connections: Not on file   Additional Social History:    Allergies:   Allergies  Allergen Reactions  . Shellfish Allergy Anaphylaxis and Hives  . Aspirin Other (See Comments)    Unknown reaction    Labs:  Results for orders placed or performed during the hospital encounter of 02/09/20 (from the past 48  hour(s))  CBG monitoring, ED     Status: Abnormal   Collection Time: 02/09/20  5:06 PM  Result Value Ref Range   Glucose-Capillary 28 (LL) 70 - 99 mg/dL    Comment: Glucose reference range applies only to samples taken after fasting for at least 8 hours.  Basic metabolic panel     Status: Abnormal   Collection Time: 02/09/20  5:40 PM  Result Value Ref Range   Sodium 139 135 - 145 mmol/L   Potassium 4.3 3.5 - 5.1 mmol/L   Chloride 105 98 - 111 mmol/L   CO2 21 (L) 22 - 32 mmol/L   Glucose, Bld 64 (L) 70 - 99 mg/dL    Comment: Glucose reference range applies only to samples taken after fasting for at least 8 hours.   BUN 13 6 - 20 mg/dL  Creatinine, Ser 1.31 (H) 0.61 - 1.24 mg/dL   Calcium 9.8 8.9 - 16.1 mg/dL   GFR, Estimated >09 >60 mL/min    Comment: (NOTE) Calculated using the CKD-EPI Creatinine Equation (2021)    Anion gap 13 5 - 15    Comment: Performed at [facility], [address], [place], [place] [address]  CBC with Differential     Status: Abnormal   Collection Time: 02/09/20  5:40 PM  Result Value Ref Range   WBC 13.9 (H) 4.0 - 10.5 K/uL   RBC 4.92 4.22 - 5.81 MIL/uL   Hemoglobin 15.4 13.0 - 17.0 g/dL   HCT 81.1 91.4 - 78.2 %   MCV 92.9 80.0 - 100.0 fL   MCH 31.3 26.0 - 34.0 pg   MCHC 33.7 30.0 - 36.0 g/dL   RDW 95.6 21.3 - 08.6 %   Platelets 267 150 - 400 K/uL   nRBC 0.0 0.0 - 0.2 %   Neutrophils Relative % 70 %   Neutro Abs 9.7 (H) 1.7 - 7.7 K/uL   Lymphocytes Relative 24 %   Lymphs Abs 3.3 0.7 - 4.0 K/uL   Monocytes Relative 6 %   Monocytes Absolute 0.9 0.1 - 1.0 K/uL   Eosinophils Relative 0 %   Eosinophils Absolute 0.1 0.0 - 0.5 K/uL   Basophils Relative 0 %   Basophils Absolute 0.0 0.0 - 0.1 K/uL   Immature Granulocytes 0 %   Abs Immature Granulocytes 0.04 0.00 - 0.07 K/uL    Comment: Performed at [facility], [address], [place], [place] [address]  Ethanol     Status: None   Collection Time: 02/09/20  5:40 PM  Result Value Ref  Range   Alcohol, Ethyl (B) <10 <10 mg/dL    Comment: (NOTE) Lowest detectable limit for serum alcohol is 10 mg/dL.  For medical purposes only. Performed at [facility], [address] [place], [place] [address]   CBG monitoring, ED (now and then every hour for 3 hours)     Status: Abnormal   Collection Time: 02/09/20  6:25 PM  Result Value Ref Range   Glucose-Capillary 235 (H) 70 - 99 mg/dL    Comment: Glucose reference range applies only to samples taken after fasting for at least 8 hours.  CBG monitoring, ED (now and then every hour for 3 hours)     Status: Abnormal   Collection Time: 02/09/20  7:37 PM  Result Value Ref Range   Glucose-Capillary 120 (H) 70 - 99 mg/dL    Comment: Glucose reference range applies only to samples taken after fasting for at least 8 hours.  Resp Panel by RT-PCR (Flu A&B, Covid) Nasopharyngeal Swab     Status: None   Collection Time: 02/09/20  7:47 PM   Specimen: Nasopharyngeal Swab; Nasopharyngeal(NP) swabs in vial transport medium  Result Value Ref Range   SARS Coronavirus 2 by RT PCR NEGATIVE NEGATIVE    Comment: (NOTE) SARS-CoV-2 target nucleic acids are NOT DETECTED.  The SARS-CoV-2 RNA is generally detectable in upper respiratory specimens during the acute phase of infection. The lowest concentration of SARS-CoV-2 viral copies this assay can detect is 138 copies/mL. A negative result does not preclude SARS-Cov-2 infection and should not be used as the sole basis for treatment or other patient management decisions. A negative result may occur with  improper specimen collection/handling, submission of specimen other than nasopharyngeal swab, presence of viral mutation(s) within the areas targeted by this  assay, and inadequate number of viral copies(<138 copies/mL). A negative result must be combined with clinical observations, patient history, and epidemiological information. The expected result is Negative.  Fact Sheet for Patients:   BloggerCourse.comhttps://www.fda.gov/media/152166/download  Fact Sheet for Healthcare Providers:  SeriousBroker.ithttps://www.fda.gov/media/152162/download  This test is no t yet approved or cleared by the [place] FDA and  has been authorized for detection and/or diagnosis of SARS-CoV-2 by FDA under an Emergency Use Authorization (EUA). This EUA will remain  in effect (meaning this test can be used) for the duration of the COVID-19 declaration under Section 564(b)(1) of the Act, 21 U.S.C.section 360bbb-3(b)(1), unless the authorization is terminated  or revoked sooner.       Influenza A by PCR NEGATIVE NEGATIVE   Influenza B by PCR NEGATIVE NEGATIVE    Comment: (NOTE) The Xpert Xpress SARS-CoV-2/FLU/RSV plus assay is intended as an aid in the diagnosis of influenza from Nasopharyngeal swab specimens and should not be used as a sole basis for treatment. Nasal washings and aspirates are unacceptable for Xpert Xpress SARS-CoV-2/FLU/RSV testing.  Fact Sheet for Patients: BloggerCourse.comhttps://www.fda.gov/media/152166/download  Fact Sheet for Healthcare Providers: SeriousBroker.ithttps://www.fda.gov/media/152162/download  This test is not yet approved or cleared by the [place] FDA and has been authorized for detection and/or diagnosis of SARS-CoV-2 by FDA under an Emergency Use Authorization (EUA). This EUA will remain in effect (meaning this test can be used) for the duration of the COVID-19 declaration under Section 564(b)(1) of the Act, 21 U.S.C. section 360bbb-3(b)(1), unless the authorization is terminated or revoked.  Performed at [facility], [address] [place], [place] [address]   CBG monitoring, ED (now and then every hour for 3 hours)     Status: None   Collection Time: 02/09/20  8:37 PM  Result Value Ref Range   Glucose-Capillary 72 70 - 99 mg/dL    Comment: Glucose reference range applies only to samples taken after fasting for at least 8 hours.  CBC     Status: None   Collection Time: 02/09/20   9:17 PM  Result Value Ref Range   WBC 7.8 4.0 - 10.5 K/uL   RBC 4.69 4.22 - 5.81 MIL/uL   Hemoglobin 14.6 13.0 - 17.0 g/dL   HCT 65.743.9 84.639.0 - 96.252.0 %   MCV 93.6 80.0 - 100.0 fL   MCH 31.1 26.0 - 34.0 pg   MCHC 33.3 30.0 - 36.0 g/dL   RDW 95.213.3 84.111.5 - 32.415.5 %   Platelets 241 150 - 400 K/uL   nRBC 0.0 0.0 - 0.2 %    Comment: Performed at [facility], [address] [place], [place] [address]  Creatinine, serum     Status: None   Collection Time: 02/09/20  9:17 PM  Result Value Ref Range   Creatinine, Ser 1.03 0.61 - 1.24 mg/dL   GFR, Estimated >72>60 >53>60 mL/min    Comment: (NOTE) Calculated using the CKD-EPI Creatinine Equation (2021) Performed at [facility], [address] [place], [place] [address]   CBG monitoring, ED     Status: None   Collection Time: 02/09/20  9:28 PM  Result Value Ref Range   Glucose-Capillary 71 70 - 99 mg/dL    Comment: Glucose reference range applies only to samples taken after fasting for at least 8 hours.  CBG monitoring, ED     Status: Abnormal   Collection Time: 02/09/20 10:40 PM  Result Value Ref Range   Glucose-Capillary 68 (L) 70 - 99 mg/dL  Comment: Glucose reference range applies only to samples taken after fasting for at least 8 hours.  CBG monitoring, ED     Status: None   Collection Time: 02/09/20 11:33 PM  Result Value Ref Range   Glucose-Capillary 89 70 - 99 mg/dL    Comment: Glucose reference range applies only to samples taken after fasting for at least 8 hours.  CBG monitoring, ED     Status: Abnormal   Collection Time: 02/10/20 12:03 AM  Result Value Ref Range   Glucose-Capillary 55 (L) 70 - 99 mg/dL    Comment: Glucose reference range applies only to samples taken after fasting for at least 8 hours.  CBG monitoring, ED     Status: Abnormal   Collection Time: 02/10/20  1:05 AM  Result Value Ref Range   Glucose-Capillary 100 (H) 70 - 99 mg/dL    Comment: Glucose reference range applies only to samples taken after  fasting for at least 8 hours.  CBG monitoring, ED     Status: Abnormal   Collection Time: 02/10/20  1:42 AM  Result Value Ref Range   Glucose-Capillary 68 (L) 70 - 99 mg/dL    Comment: Glucose reference range applies only to samples taken after fasting for at least 8 hours.  CBG monitoring, ED     Status: Abnormal   Collection Time: 02/10/20  2:31 AM  Result Value Ref Range   Glucose-Capillary 130 (H) 70 - 99 mg/dL    Comment: Glucose reference range applies only to samples taken after fasting for at least 8 hours.  CBG monitoring, ED     Status: Abnormal   Collection Time: 02/10/20  3:13 AM  Result Value Ref Range   Glucose-Capillary 108 (H) 70 - 99 mg/dL    Comment: Glucose reference range applies only to samples taken after fasting for at least 8 hours.  CBC     Status: None   Collection Time: 02/10/20  3:16 AM  Result Value Ref Range   WBC 7.5 4.0 - 10.5 K/uL   RBC 4.42 4.22 - 5.81 MIL/uL   Hemoglobin 14.1 13.0 - 17.0 g/dL   HCT 16.1 09.6 - 04.5 %   MCV 93.0 80.0 - 100.0 fL   MCH 31.9 26.0 - 34.0 pg   MCHC 34.3 30.0 - 36.0 g/dL   RDW 40.9 81.1 - 91.4 %   Platelets 222 150 - 400 K/uL   nRBC 0.0 0.0 - 0.2 %    Comment: Performed at [facility], [address] [place], [place] [address]  Basic metabolic panel     Status: None   Collection Time: 02/10/20  3:16 AM  Result Value Ref Range   Sodium 135 135 - 145 mmol/L   Potassium 3.8 3.5 - 5.1 mmol/L   Chloride 103 98 - 111 mmol/L   CO2 24 22 - 32 mmol/L   Glucose, Bld 82 70 - 99 mg/dL    Comment: Glucose reference range applies only to samples taken after fasting for at least 8 hours.   BUN 10 6 - 20 mg/dL   Creatinine, Ser 6.21 0.61 - 1.24 mg/dL   Calcium 9.1 8.9 - 30.8 mg/dL   GFR, Estimated >65 >78 mL/min    Comment: (NOTE) Calculated using the CKD-EPI Creatinine Equation (2021)    Anion gap 8 5 - 15    Comment: Performed at [facility], [address], [place], [place] [address]  CBG monitoring, ED  Status: Abnormal   Collection Time: 02/10/20  4:21 AM  Result Value Ref Range   Glucose-Capillary 114 (H) 70 - 99 mg/dL    Comment: Glucose reference range applies only to samples taken after fasting for at least 8 hours.  CBG monitoring, ED     Status: Abnormal   Collection Time: 02/10/20  5:33 AM  Result Value Ref Range   Glucose-Capillary 101 (H) 70 - 99 mg/dL    Comment: Glucose reference range applies only to samples taken after fasting for at least 8 hours.  Urinalysis, Routine w reflex microscopic Urine, Clean Catch     Status: Abnormal   Collection Time: 02/10/20  5:43 AM  Result Value Ref Range   Color, Urine YELLOW YELLOW   APPearance CLEAR CLEAR   Specific Gravity, Urine 1.009 1.005 - 1.030   pH 5.0 5.0 - 8.0   Glucose, UA >=500 (A) NEGATIVE mg/dL   Hgb urine dipstick NEGATIVE NEGATIVE   Bilirubin Urine NEGATIVE NEGATIVE   Ketones, ur NEGATIVE NEGATIVE mg/dL   Protein, ur 355 (A) NEGATIVE mg/dL   Nitrite NEGATIVE NEGATIVE   Leukocytes,Ua NEGATIVE NEGATIVE   RBC / HPF 0-5 0 - 5 RBC/hpf   WBC, UA 0-5 0 - 5 WBC/hpf   Bacteria, UA NONE SEEN NONE SEEN    Comment: Performed at [facility], [address] [place], [place] [address]  Urine rapid drug screen (hosp performed)     Status: Abnormal   Collection Time: 02/10/20  5:43 AM  Result Value Ref Range   Opiates NONE DETECTED NONE DETECTED   Cocaine NONE DETECTED NONE DETECTED   Benzodiazepines POSITIVE (A) NONE DETECTED   Amphetamines NONE DETECTED NONE DETECTED   Tetrahydrocannabinol POSITIVE (A) NONE DETECTED   Barbiturates NONE DETECTED NONE DETECTED    Comment: (NOTE) DRUG SCREEN FOR MEDICAL PURPOSES ONLY.  IF CONFIRMATION IS NEEDED FOR ANY PURPOSE, NOTIFY LAB WITHIN 5 DAYS.  LOWEST DETECTABLE LIMITS FOR URINE DRUG SCREEN Drug Class                     Cutoff (ng/mL) Amphetamine and metabolites    1000 Barbiturate and metabolites    200 Benzodiazepine                 200 Tricyclics and metabolites      300 Opiates and metabolites        300 Cocaine and metabolites        300 THC                            50 Performed at [facility], [address], [place], [place] [address]   CBG monitoring, ED     Status: Abnormal   Collection Time: 02/10/20  6:31 AM  Result Value Ref Range   Glucose-Capillary 132 (H) 70 - 99 mg/dL    Comment: Glucose reference range applies only to samples taken after fasting for at least 8 hours.  CBG monitoring, ED     Status: Abnormal   Collection Time: 02/10/20  7:28 AM  Result Value Ref Range   Glucose-Capillary 145 (H) 70 - 99 mg/dL    Comment: Glucose reference range applies only to samples taken after fasting for at least 8 hours.  CBG monitoring, ED     Status: Abnormal   Collection Time: 02/10/20  8:32 AM  Result Value Ref Range   Glucose-Capillary 171 (H) 70 -  99 mg/dL    Comment: Glucose reference range applies only to samples taken after fasting for at least 8 hours.  CBG monitoring, ED     Status: Abnormal   Collection Time: 02/10/20 10:51 AM  Result Value Ref Range   Glucose-Capillary 184 (H) 70 - 99 mg/dL    Comment: Glucose reference range applies only to samples taken after fasting for at least 8 hours.  CBG monitoring, ED     Status: Abnormal   Collection Time: 02/10/20 12:18 PM  Result Value Ref Range   Glucose-Capillary 166 (H) 70 - 99 mg/dL    Comment: Glucose reference range applies only to samples taken after fasting for at least 8 hours.  Glucose, capillary     Status: Abnormal   Collection Time: 02/10/20  3:55 PM  Result Value Ref Range   Glucose-Capillary 170 (H) 70 - 99 mg/dL    Comment: Glucose reference range applies only to samples taken after fasting for at least 8 hours.  Glucose, capillary     Status: Abnormal   Collection Time: 02/10/20  9:29 PM  Result Value Ref Range   Glucose-Capillary 296 (H) 70 - 99 mg/dL    Comment: Glucose reference range applies only to samples taken after fasting for at least 8 hours.   Glucose, capillary     Status: Abnormal   Collection Time: 02/10/20 11:35 PM  Result Value Ref Range   Glucose-Capillary 296 (H) 70 - 99 mg/dL    Comment: Glucose reference range applies only to samples taken after fasting for at least 8 hours.  Glucose, capillary     Status: Abnormal   Collection Time: 02/11/20  3:53 AM  Result Value Ref Range   Glucose-Capillary 252 (H) 70 - 99 mg/dL    Comment: Glucose reference range applies only to samples taken after fasting for at least 8 hours.  CBC     Status: None   Collection Time: 02/11/20  7:29 AM  Result Value Ref Range   WBC 6.9 4.0 - 10.5 K/uL   RBC 5.22 4.22 - 5.81 MIL/uL   Hemoglobin 16.4 13.0 - 17.0 g/dL   HCT 06.3 01.6 - 01.0 %   MCV 91.8 80.0 - 100.0 fL   MCH 31.4 26.0 - 34.0 pg   MCHC 34.2 30.0 - 36.0 g/dL   RDW 93.2 35.5 - 73.2 %   Platelets 267 150 - 400 K/uL   nRBC 0.0 0.0 - 0.2 %    Comment: Performed at [facility], [address], [place], [place] [address]  Basic metabolic panel     Status: Abnormal   Collection Time: 02/11/20  7:29 AM  Result Value Ref Range   Sodium 137 135 - 145 mmol/L   Potassium 4.6 3.5 - 5.1 mmol/L   Chloride 98 98 - 111 mmol/L   CO2 25 22 - 32 mmol/L   Glucose, Bld 249 (H) 70 - 99 mg/dL    Comment: Glucose reference range applies only to samples taken after fasting for at least 8 hours.   BUN 11 6 - 20 mg/dL   Creatinine, Ser 2.70 (H) 0.61 - 1.24 mg/dL   Calcium 62.3 8.9 - 76.2 mg/dL   GFR, Estimated >83 >15 mL/min    Comment: (NOTE) Calculated using the CKD-EPI Creatinine Equation (2021)    Anion gap 14 5 - 15    Comment: Performed at [facility], [address] [place], [place] [address]  Glucose, capillary  Status: Abnormal   Collection Time: 02/11/20  7:33 AM  Result Value Ref Range   Glucose-Capillary 259 (H) 70 - 99 mg/dL    Comment: Glucose reference range applies only to samples taken after fasting for at least 8 hours.  Hemoglobin A1c     Status: Abnormal    Collection Time: 02/11/20  7:52 AM  Result Value Ref Range   Hgb A1c MFr Bld 8.8 (H) 4.8 - 5.6 %    Comment: (NOTE) Pre diabetes:          5.7%-6.4%  Diabetes:              >6.4%  Glycemic control for   <7.0% adults with diabetes    Mean Plasma Glucose 205.86 mg/dL    Comment: Performed at [facility], [address], [place], [place] [address]  Glucose, capillary     Status: None   Collection Time: 02/11/20 12:25 PM  Result Value Ref Range   Glucose-Capillary 97 70 - 99 mg/dL    Comment: Glucose reference range applies only to samples taken after fasting for at least 8 hours.    Current Facility-Administered Medications  Medication Dose Route Frequency Provider Last Rate Last Admin  . 0.9 %  sodium chloride infusion  250 mL Intravenous PRN Pricilla Loveless, MD      . acetaminophen (TYLENOL) tablet 650 mg  650 mg Oral Q6H PRN Chotiner, Claudean Severance, MD       Or  . acetaminophen (TYLENOL) suppository 650 mg  650 mg Rectal Q6H PRN Chotiner, Claudean Severance, MD      . amLODipine (NORVASC) tablet 10 mg  10 mg Oral Daily Zannie Cove, MD   10 mg at 02/11/20 0951  . [START ON 02/12/2020] elvitegravir-cobicistat-emtricitabine-tenofovir (GENVOYA) 150-150-200-10 MG tablet 1 tablet  1 tablet Oral Q breakfast Zannie Cove, MD      . enoxaparin (LOVENOX) injection 40 mg  40 mg Subcutaneous Q24H Chotiner, Claudean Severance, MD   40 mg at 02/09/20 2156  . insulin aspart (novoLOG) injection 0-9 Units  0-9 Units Subcutaneous TID WC Zannie Cove, MD      . insulin aspart (novoLOG) injection 5 Units  5 Units Subcutaneous TID WC Zannie Cove, MD      . insulin glargine (LANTUS) injection 25 Units  25 Units Subcutaneous QHS Zannie Cove, MD      . LORazepam (ATIVAN) injection 1 mg  1 mg Intravenous Q4H PRN Chotiner, Claudean Severance, MD      . sodium chloride flush (NS) 0.9 % injection 3 mL  3 mL Intravenous Q12H Pricilla Loveless, MD   3 mL at 02/11/20 0953  . sodium chloride flush (NS) 0.9 % injection  3 mL  3 mL Intravenous PRN Pricilla Loveless, MD        Musculoskeletal: Strength & Muscle Tone: within normal limits Gait & Station: normal Patient leans: N/A  Psychiatric Specialty Exam: Physical Exam Vitals and nursing note reviewed.  Constitutional:      Appearance: Normal appearance. He is normal weight.  Neurological:     Mental Status: He is alert.  Psychiatric:        Behavior: Behavior normal.        Thought Content: Thought content normal.        Judgment: Judgment normal.     Comments:  Depressed      Review of Systems  Constitutional: Negative for appetite change and unexpected weight change.  Neurological: Negative for weakness.  Psychiatric/Behavioral: Positive for  agitation. Negative for decreased concentration, dysphoric mood, sleep disturbance and suicidal ideas. The patient is not nervous/anxious.   All other systems reviewed and are negative.   Blood pressure (!) 149/108, pulse 88, temperature 98.7 F (37.1 C), temperature source Oral, resp. rate 14, height 6\' 1"  (1.854 m), weight 87.5 kg, SpO2 100 %.Body mass index is 25.45 kg/m.  General Appearance: Fairly Groomed  Eye Contact:  Fair  Speech:  Clear and Coherent and Normal Rate  Volume:  Normal  Mood:  Depressed  Affect:  Depressed  Thought Process:  Coherent, Linear and Descriptions of Associations: Intact  Orientation:  Full (Time, Place, and Person)  Thought Content:  Logical  Suicidal Thoughts:  No  Homicidal Thoughts:  No  Memory:  Immediate;   Fair Recent;   Fair Remote;   Fair  Judgement:  Fair  Insight:  Fair  Psychomotor Activity:  Normal  Concentration:  Concentration: Fair and Attention Span: Fair  Recall:  [employer] of Knowledge:  Fair  Language:  Fair  Akathisia:  No  Handed:  Right  AIMS (if indicated):     Assets:  Communication Skills Desire for Improvement Financial Resources/Insurance Physical Health Resilience Social Support  ADL's:  Intact  Cognition:  WNL  Sleep:         Treatment Plan Summary: Plan Psych clear patient. May rescind IVC. Patient is open to seeking outpatient behavioral health services. Please place referral for [facility], for management of depression.    Disposition: No evidence of imminent risk to self or others at present.   Patient does not meet criteria for psychiatric inpatient admission. Supportive therapy provided about ongoing stressors. Refer to IOP. Discussed crisis plan, support from social network, calling 911, coming to the Emergency Department, and calling Suicide Hotline.  -Psychiatry to sign off at this time, please reconsult if needed.  Maryagnes Amos, FNP 02/11/2020 3:15 PM

## 2020-02-11 NOTE — Discharge Summary (Signed)
Physician Discharge Summary  [name] QIH:[id number] DOB: 29-Oct-1985 DOA: 02/09/2020  PCP: Patient, No Pcp Per  Admit date: 02/09/2020 Discharge date: 02/11/2020  Time spent: 45 minutes  Recommendations for Outpatient Follow-up:  PCP in 1 week [place] in 2 weeks  Discharge Diagnoses:  Active Problems:   Diabetes mellitus type 1 (HCC)   Hypoglycemia   Altered mental status   Aggressive behavior   Discharge Condition: stable  Diet recommendation: diabetic  Filed Weights   02/09/20 1845  Weight: 87.5 kg    History of present illness:  34 year old male with type 1 diabetes mellitus, HIV, depression was brought to the ED by EMS, he was IVC by EMTs when he refused to come to the ED for hypoglycemia, he was reportedly yelling agitated and diaphoretic.  Reportedly police and EMS were called to evaluate patient at work as he was altered and agitated, was found to have very low blood sugars given glucagon by EMS, CBG was 28 when he arrived in the ER, was agitated and combative upon arrival, given multiple amps of D50 and Haldol for sedation.   Hospital Course:   Recurrent hypoglycemia -Improving, weaned off D10 drip yesterday -due to poor intake with continued insulin use -CBGs improved and was restarted on Lantus 26units and Novolog with meals, CBGs stable on this regimen -advised close FU with PCP in 1 week with CBG logs  Agitation, combativeness -likely due to hypoglycemia -Patient denies suicidal ideations, however this is documented in the ED notes -IVC was completed by EMTs prior to arrival in the ED when he was hypoglycemic -He is awake and alert but extremely angry and refuses to discuss anything -requested psych consult, seen by psych today and cleared for discharge home with outpatient psych follow up  HIV -Last CD4 count was 641 and viral load was less than 20 in September -Restarted Genvoya    Discharge Exam: Vitals:   02/11/20 0429 02/11/20 1222   BP: (!) 138/58 (!) 149/108  Pulse: 77 88  Resp: 12 14  Temp: 98.7 F (37.1 C) 98.7 F (37.1 C)  SpO2: 100% 100%    General: AAOx3 Cardiovascular: S1S2/RRR Respiratory: CTAB  Discharge Instructions   Discharge Instructions    Diet Carb Modified   Complete by: As directed      Allergies as of 02/11/2020      Reactions   Shellfish Allergy Anaphylaxis, Hives   Aspirin Other (See Comments)   Unknown reaction      Medication List    TAKE these medications   amLODipine 5 MG tablet Commonly known as: NORVASC Take 5 mg by mouth daily.   blood glucose meter kit and supplies Dispense based on patient and insurance preference. Use up to four times daily as directed. (FOR ICD-10 E10.9, E11.9).   citalopram 20 MG tablet Commonly known as: CELEXA Take 20 mg by mouth daily.   Genvoya 150-150-200-10 MG Tabs tablet Generic drug: elvitegravir-cobicistat-emtricitabine-tenofovir Take 1 tablet by mouth daily with breakfast.   insulin aspart 100 UNIT/ML injection Commonly known as: novoLOG Inject 5 Units into the skin 3 (three) times daily with meals.   insulin glargine 100 UNIT/ML injection Commonly known as: LANTUS Inject 0.26 mLs (26 Units total) into the skin daily.      Allergies  Allergen Reactions  . Shellfish Allergy Anaphylaxis and Hives  . Aspirin Other (See Comments)    Unknown reaction    Follow-up Information    PCP. Schedule an appointment as soon as possible for a  visit in 1 week(s).        behavioural health. Go in 1 week(s).   Contact information: Behavioiral health urgent care  on [address]               The results of significant diagnostics from this hospitalization (including imaging, microbiology, ancillary and laboratory) are listed below for reference.    Significant Diagnostic Studies: No results found.  Microbiology: Recent Results (from the past 240 hour(s))  Resp Panel by RT-PCR (Flu A&B, Covid) Nasopharyngeal Swab      Status: None   Collection Time: 02/09/20  7:47 PM   Specimen: Nasopharyngeal Swab; Nasopharyngeal(NP) swabs in vial transport medium  Result Value Ref Range Status   SARS Coronavirus 2 by RT PCR NEGATIVE NEGATIVE Final    Comment: (NOTE) SARS-CoV-2 target nucleic acids are NOT DETECTED.  The SARS-CoV-2 RNA is generally detectable in upper respiratory specimens during the acute phase of infection. The lowest concentration of SARS-CoV-2 viral copies this assay can detect is 138 copies/mL. A negative result does not preclude SARS-Cov-2 infection and should not be used as the sole basis for treatment or other patient management decisions. A negative result may occur with  improper specimen collection/handling, submission of specimen other than nasopharyngeal swab, presence of viral mutation(s) within the areas targeted by this assay, and inadequate number of viral copies(<138 copies/mL). A negative result must be combined with clinical observations, patient history, and epidemiological information. The expected result is Negative.  Fact Sheet for Patients:  EntrepreneurPulse.com.au  Fact Sheet for Healthcare Providers:  IncredibleEmployment.be  This test is no t yet approved or cleared by the [place] FDA and  has been authorized for detection and/or diagnosis of SARS-CoV-2 by FDA under an Emergency Use Authorization (EUA). This EUA will remain  in effect (meaning this test can be used) for the duration of the COVID-19 declaration under Section 564(b)(1) of the Act, 21 U.S.C.section 360bbb-3(b)(1), unless the authorization is terminated  or revoked sooner.       Influenza A by PCR NEGATIVE NEGATIVE Final   Influenza B by PCR NEGATIVE NEGATIVE Final    Comment: (NOTE) The Xpert Xpress SARS-CoV-2/FLU/RSV plus assay is intended as an aid in the diagnosis of influenza from Nasopharyngeal swab specimens and should not be used as a sole basis  for treatment. Nasal washings and aspirates are unacceptable for Xpert Xpress SARS-CoV-2/FLU/RSV testing.  Fact Sheet for Patients: EntrepreneurPulse.com.au  Fact Sheet for Healthcare Providers: IncredibleEmployment.be  This test is not yet approved or cleared by the [place] FDA and has been authorized for detection and/or diagnosis of SARS-CoV-2 by FDA under an Emergency Use Authorization (EUA). This EUA will remain in effect (meaning this test can be used) for the duration of the COVID-19 declaration under Section 564(b)(1) of the Act, 21 U.S.C. section 360bbb-3(b)(1), unless the authorization is terminated or revoked.  Performed at [facility], [place] [address], [place], [place] [address]      Labs: Basic Metabolic Panel: Recent Labs  Lab 02/09/20 1740 02/09/20 2117 02/10/20 0316 02/11/20 0729  NA 139  --  135 137  K 4.3  --  3.8 4.6  CL 105  --  103 98  CO2 21*  --  24 25  GLUCOSE 64*  --  82 249*  BUN 13  --  10 11  CREATININE 1.31* 1.03 1.12 1.37*  CALCIUM 9.8  --  9.1 10.0   Liver Function Tests: No results for input(s): AST, ALT,  ALKPHOS, BILITOT, PROT, ALBUMIN in the last 168 hours. No results for input(s): LIPASE, AMYLASE in the last 168 hours. No results for input(s): AMMONIA in the last 168 hours. CBC: Recent Labs  Lab 02/09/20 1740 02/09/20 2117 02/10/20 0316 02/11/20 0729  WBC 13.9* 7.8 7.5 6.9  NEUTROABS 9.7*  --   --   --   HGB 15.4 14.6 14.1 16.4  HCT 45.7 43.9 41.1 47.9  MCV 92.9 93.6 93.0 91.8  PLT 267 241 222 267   Cardiac Enzymes: No results for input(s): CKTOTAL, CKMB, CKMBINDEX, TROPONINI in the last 168 hours. BNP: BNP (last 3 results) No results for input(s): BNP in the last 8760 hours.  ProBNP (last 3 results) No results for input(s): PROBNP in the last 8760 hours.  CBG: Recent Labs  Lab 02/10/20 2129 02/10/20 2335 02/11/20 0353 02/11/20 0733 02/11/20 1225  GLUCAP 296*  296* 252* 259* 97       Signed:  Domenic Polite MD.  [facility] 02/11/2020, 4:12 PM

## 2020-03-04 ENCOUNTER — Other Ambulatory Visit: Payer: Self-pay

## 2020-03-04 DIAGNOSIS — Z113 Encounter for screening for infections with a predominantly sexual mode of transmission: Secondary | ICD-10-CM

## 2020-03-10 ENCOUNTER — Other Ambulatory Visit: Payer: Self-pay

## 2020-03-10 ENCOUNTER — Other Ambulatory Visit: Payer: Self-pay | Admitting: *Deleted

## 2020-03-10 ENCOUNTER — Ambulatory Visit: Payer: Self-pay

## 2020-03-10 DIAGNOSIS — B20 Human immunodeficiency virus [HIV] disease: Secondary | ICD-10-CM

## 2020-03-10 DIAGNOSIS — Z113 Encounter for screening for infections with a predominantly sexual mode of transmission: Secondary | ICD-10-CM

## 2020-03-11 ENCOUNTER — Other Ambulatory Visit: Payer: Self-pay

## 2020-03-11 ENCOUNTER — Ambulatory Visit: Payer: Self-pay

## 2020-03-11 LAB — T-HELPER CELL (CD4) - (RCID CLINIC ONLY)
CD4 % Helper T Cell: 37 % (ref 33–65)
CD4 T Cell Abs: 582 /uL (ref 400–1790)

## 2020-03-16 LAB — COMPLETE METABOLIC PANEL WITH GFR
AG Ratio: 1.7 (calc) (ref 1.0–2.5)
ALT: 11 U/L (ref 9–46)
AST: 13 U/L (ref 10–40)
Albumin: 4.1 g/dL (ref 3.6–5.1)
Alkaline phosphatase (APISO): 47 U/L (ref 36–130)
BUN: 11 mg/dL (ref 7–25)
CO2: 30 mmol/L (ref 20–32)
Calcium: 9.6 mg/dL (ref 8.6–10.3)
Chloride: 107 mmol/L (ref 98–110)
Creat: 1.08 mg/dL (ref 0.60–1.35)
GFR, Est African American: 104 mL/min/{1.73_m2} (ref >=60)
GFR, Est Non African American: 90 mL/min/{1.73_m2} (ref >=60)
Globulin: 2.4 g/dL (calc) (ref 1.9–3.7)
Glucose, Bld: 92 mg/dL (ref 65–99)
Potassium: 4.6 mmol/L (ref 3.5–5.3)
Sodium: 141 mmol/L (ref 135–146)
Total Bilirubin: 0.4 mg/dL (ref 0.2–1.2)
Total Protein: 6.5 g/dL (ref 6.1–8.1)

## 2020-03-16 LAB — CBC WITH DIFFERENTIAL/PLATELET
Absolute Monocytes: 557 cells/uL (ref 200–950)
Basophils Absolute: 17 cells/uL (ref 0–200)
Basophils Relative: 0.3 %
Eosinophils Absolute: 122 cells/uL (ref 15–500)
Eosinophils Relative: 2.1 %
HCT: 44.3 % (ref 38.5–50.0)
Hemoglobin: 15 g/dL (ref 13.2–17.1)
Lymphs Abs: 1549 cells/uL (ref 850–3900)
MCH: 31.6 pg (ref 27.0–33.0)
MCHC: 33.9 g/dL (ref 32.0–36.0)
MCV: 93.3 fL (ref 80.0–100.0)
MPV: 9.6 fL (ref 7.5–12.5)
Monocytes Relative: 9.6 %
Neutro Abs: 3555 cells/uL (ref 1500–7800)
Neutrophils Relative %: 61.3 %
Platelets: 262 10*3/uL (ref 140–400)
RBC: 4.75 10*6/uL (ref 4.20–5.80)
RDW: 12.8 % (ref 11.0–15.0)
Total Lymphocyte: 26.7 %
WBC: 5.8 10*3/uL (ref 3.8–10.8)

## 2020-03-16 LAB — FLUORESCENT TREPONEMAL AB(FTA)-IGG-BLD: Fluorescent Treponemal ABS: REACTIVE — AB

## 2020-03-16 LAB — RPR: RPR Ser Ql: REACTIVE — AB

## 2020-03-16 LAB — HIV-1 RNA QUANT-NO REFLEX-BLD
HIV 1 RNA Quant: <20 Copies/mL
HIV-1 RNA Quant, Log: <1.3 Log cps/mL

## 2020-03-16 LAB — RPR TITER: RPR Titer: 1:16 {titer} — ABNORMAL HIGH

## 2020-03-25 ENCOUNTER — Encounter: Payer: Self-pay | Admitting: Internal Medicine

## 2020-04-16 ENCOUNTER — Telehealth: Payer: Self-pay

## 2020-04-16 ENCOUNTER — Other Ambulatory Visit: Payer: Self-pay | Admitting: Nurse Practitioner

## 2020-04-16 DIAGNOSIS — E1042 Type 1 diabetes mellitus with diabetic polyneuropathy: Secondary | ICD-10-CM

## 2020-04-16 NOTE — Telephone Encounter (Signed)
Patient aware he needs a PCP.

## 2020-05-04 ENCOUNTER — Other Ambulatory Visit: Payer: Self-pay

## 2020-05-04 ENCOUNTER — Other Ambulatory Visit: Payer: Self-pay | Admitting: Internal Medicine

## 2020-05-04 ENCOUNTER — Encounter: Payer: Self-pay | Admitting: Internal Medicine

## 2020-05-04 ENCOUNTER — Telehealth (INDEPENDENT_AMBULATORY_CARE_PROVIDER_SITE_OTHER): Payer: Self-pay | Admitting: Internal Medicine

## 2020-05-04 DIAGNOSIS — F32A Depression, unspecified: Secondary | ICD-10-CM

## 2020-05-04 DIAGNOSIS — A539 Syphilis, unspecified: Secondary | ICD-10-CM

## 2020-05-04 DIAGNOSIS — Z21 Asymptomatic human immunodeficiency virus [HIV] infection status: Secondary | ICD-10-CM

## 2020-05-04 DIAGNOSIS — E1021 Type 1 diabetes mellitus with diabetic nephropathy: Secondary | ICD-10-CM

## 2020-05-04 MED ORDER — GENVOYA 150-150-200-10 MG PO TABS: 1.0000 | ORAL_TABLET | Freq: Every day | ORAL | 11 refills | Status: DC

## 2020-05-04 NOTE — Assessment & Plan Note (Signed)
His HIV is under good control with undetectable viral load and healthy CD4 on Genvoya.  Will continue this for him and sent in refills to his pharmacy today.  RTC 6 months with labs before.

## 2020-05-04 NOTE — Assessment & Plan Note (Signed)
Most recent RPR up from 1:8 to 1:16.  Does not represent 4-fold increase for re-infection.  Will continue to monitor RPR closely.

## 2020-05-04 NOTE — Assessment & Plan Note (Addendum)
Pt is managing his own diabetes and insulin regimen with recent A1c showing suboptimal control of his DM and recent admission for hypoglycemia.  Discussed with patient that he needs to get back in to care with PCP who can better manage this for him.  Will refer him to [facility] for further assistance.

## 2020-05-04 NOTE — Assessment & Plan Note (Addendum)
Continues to have ongoing issues with depression and experiences varying degrees of "highs and lows" that last for several days at a time.  He met with our counselor in December but then missed follow up appointment due to being admitted.  Appears to have subsequently cancelled appointment in January.  Discussed today getting him back in to see our counselor.  Also, discussed medication management and possible change from Celexa 31m daily.  [facility] of his [facility]ressants.   He has also been referred previously to behavioral health during recent admissions for hypoglycemia.

## 2020-05-04 NOTE — Progress Notes (Signed)
[place] for Infectious Disease   CHIEF COMPLAINT    HIV follow up.  Last seen by me on 12/06/2019  SUBJECTIVE:    [name] is a 35 y.o. male with PMHx as below who presents to the clinic for HIV follow up.   Please see A&P for the details of today's visit and status of the patient's medical problems.   Patient's Medications  New Prescriptions   No medications on file  Previous Medications   AMLODIPINE (NORVASC) 5 MG TABLET    Take 5 mg by mouth daily.   BLOOD GLUCOSE METER KIT AND SUPPLIES    Dispense based on patient and insurance preference. Use up to four times daily as directed. (FOR ICD-10 E10.9, E11.9).   CITALOPRAM (CELEXA) 20 MG TABLET    Take 20 mg by mouth daily.   INSULIN ASPART (NOVOLOG) 100 UNIT/ML INJECTION    Inject 5 Units into the skin 3 (three) times daily with meals.   INSULIN GLARGINE (LANTUS) 100 UNIT/ML INJECTION    Inject 0.26 mLs (26 Units total) into the skin daily.  Modified Medications   Modified Medication Previous Medication   ELVITEGRAVIR-COBICISTAT-EMTRICITABINE-TENOFOVIR (GENVOYA) 150-150-200-10 MG TABS TABLET elvitegravir-cobicistat-emtricitabine-tenofovir (GENVOYA) 150-150-200-10 MG TABS tablet      Take 1 tablet by mouth daily with breakfast.    Take 1 tablet by mouth daily with breakfast.  Discontinued Medications   No medications on file      Past Medical History:  Diagnosis Date  . Anxiety   . Asthma    no prior hospitalizations, intubations  . Depression   . Headache    "only w/stress" (11/01/2017)  . HIV (human immunodeficiency virus infection) ([place])   . Migraine    "a few/month" (11/01/2017)  . Refusal of blood transfusions as patient is Jehovah's Witness   . Seizure ([place])    "only w/my low blood sugars"  (11/01/2017)  . Type I diabetes mellitus (HCC)     Social History   Tobacco Use  . Smoking status: Current Every Day Smoker    Types: Cigars    Start date: 09/29/2015  . Smokeless tobacco: Never  Used  . Tobacco comment: 11/01/2017  "2 cigarettes in ~ 6 month "  Vaping Use  . Vaping Use: Never used  Substance Use Topics  . Alcohol use: Yes    Alcohol/week: 7.0 standard drinks    Types: 7 Cans of beer per week  . Drug use: Not Currently    Frequency: 2.0 times per week    Types: Marijuana    Comment: 11/01/2017  "weekly"    Family History  Problem Relation Age of Onset  . Thyroid disease Mother   . Diabetes Mother   . Breast cancer Maternal Grandmother   . Diabetes Maternal Grandmother     Allergies  Allergen Reactions  . Shellfish Allergy Anaphylaxis and Hives  . Aspirin Other (See Comments)    Unknown reaction    Review of Systems  Constitutional: Negative for chills and fever.  Respiratory: Negative.   Cardiovascular: Negative.   Gastrointestinal: Negative.   Skin: Negative.   Psychiatric/Behavioral: Positive for depression.     OBJECTIVE:    There were no vitals filed for this visit.   There is no height or weight on file to calculate BMI.  Physical Exam  Telephone visit.  Patient conversant, pleasant, NAD  Labs and Microbiology: CMP Latest Ref Rng & Units 03/10/2020 02/11/2020 02/10/2020  Glucose 65 -  99 mg/dL 92 249(H) 82  BUN 7 - 25 mg/dL _0 Creatinine 0.60 - 1.35 mg/dL 1.08 1.37(H) 1.12  Sodium 135 - 146 mmol/L 141 137 135  Potassium 3.5 - 5.3 mmol/L 4.6 4.6 3.8  Chloride 98 - 110 mmol/L 107 98 103  CO2 20 - 32 mmol/L _1 Calcium 8.6 - 10.3 mg/dL 9.6 10.0 9.1  Total Protein 6.1 - 8.1 g/dL 6.5 - -  Total Bilirubin 0.2 - 1.2 mg/dL 0.4 - -  Alkaline Phos 38 - 126 U/L - - -  AST 10 - 40 U/L 13 - -  ALT 9 - 46 U/L 11 - -   CBC Latest Ref Rng & Units 03/10/2020 02/11/2020 02/10/2020  WBC 3.8 - 10.8 Thousand/uL 5.8 6.9 7.5  Hemoglobin 13.2 - 17.1 g/dL 15.0 16.4 14.1  Hematocrit 38.5 - 50.0 % 44.3 47.9 41.1  Platelets 140 - 400 Thousand/uL 262 267 222     Lab Results  Component Value Date   HIV1RNAQUANT <20 03/10/2020    HIV1RNAQUANT <20 11/22/2019   HIV1RNAQUANT <20 NOT DETECTED 01/01/2019   CD4TABS 582 03/10/2020   CD4TABS 641 11/22/2019   CD4TABS 561 01/01/2019    RPR and STI: Lab Results  Component Value Date   LABRPR REACTIVE (A) 03/10/2020   LABRPR REACTIVE (A) 11/22/2019   LABRPR REACTIVE (A) 01/01/2019   LABRPR NON-REACTIVE 04/17/2018   LABRPR NON-REACTIVE 12/22/2017   RPRTITER 1:16 (H) 03/10/2020   RPRTITER 1:8 (H) 11/22/2019   RPRTITER 1:32 (H) 01/01/2019   RPRTITER 1:16 (A) 08/17/2015      Hepatitis B: Lab Results  Component Value Date   HEPBSAB POS (A) 10/21/2014   HEPBSAG NEGATIVE 10/21/2014   HEPBCAB NON REACTIVE 10/21/2014   Hepatitis C: No results found for: HEPCAB, HCVRNAPCRQN Hepatitis A: Lab Results  Component Value Date   HAV NON-REACTIVE 12/06/2019   Lipids: Lab Results  Component Value Date   CHOL 163 11/22/2019   TRIG 49 11/22/2019   HDL 85 11/22/2019   CHOLHDL 1.9 11/22/2019   VLDL 18 08/17/2015   LDLCALC 64 11/22/2019      ASSESSMENT & PLAN:    Diabetes mellitus type 1 ([place]) Pt is managing his own diabetes and insulin regimen with recent A1c showing suboptimal control of his DM and recent admission for hypoglycemia.  Discussed with patient that he needs to get back in to care with PCP who can better manage this for him.  Will refer him to [facility] for further assistance.   HIV infection ([place]) His HIV is under good control with undetectable viral load and healthy CD4 on Genvoya.  Will continue this for him and sent in refills to his pharmacy today.  RTC 6 months with labs before.  Depression Continues to have ongoing issues with depression and experiences varying degrees of "highs and lows" that last for several days at a time.  He met with our counselor in December but then missed follow up appointment due to being admitted.  Appears to have subsequently cancelled appointment in January.  Discussed today getting him back in to see our counselor.  Also,  discussed medication management and possible change from Celexa 54m daily.  [facility] and further management of his anti-depressants.   He has also been referred previously to behavioral health during recent admissions for hypoglycemia.  Syphilis Most recent RPR up from 1:8 to 1:16.  Does not represent 4-fold increase for re-infection.  Will continue to monitor RPR closely.    Orders Placed This Encounter  Procedures  . T-helper cell (CD4)- ([facility] clinic only)    Standing Status:   Future    Standing Expiration Date:   05/03/2021  . HIV-1 RNA quant-no reflex-bld    6 month viral load    Standing Status:   Future    Standing Expiration Date:   05/04/2021  . CBC    Standing Status:   Future    Standing Expiration Date:   05/04/2021  . Comprehensive metabolic panel    Standing Status:   Future    Standing Expiration Date:   05/04/2021    Order Specific Question:   Has the patient fasted?    Answer:   Yes  . Lipid panel    Lipid 6 months    Standing Status:   Future    Standing Expiration Date:   05/04/2021    Order Specific Question:   Has the patient fasted?    Answer:   Yes  . RPR    Standing Status:   Future    Standing Expiration Date:   05/04/2021  . Ambulatory referral to Internal Medicine    Referral Priority:   Routine    Referral Type:   Consultation    Referral Reason:   Specialty Services Required    Requested Specialty:   Internal Medicine    Number of Visits Requested:   [place] for Infectious Disease [facility] 05/04/2020, 2:22 PM   Virtual Visit via Telephone Note   I connected with [name] on 05/04/20 at 2:22 PM by telephone and verified that I am speaking with the correct person using two identifiers.   I discussed the limitations, risks, security and privacy concerns of performing an evaluation and management service by telephone and the availability of in person  appointments. I also discussed with the patient that there may be a patient responsible charge related to this service. The patient expressed understanding and agreed to proceed.  Patient location: home My location: rcid Duration of call and time spent: 25 min

## 2020-05-05 ENCOUNTER — Ambulatory Visit: Payer: Self-pay

## 2020-06-19 ENCOUNTER — Other Ambulatory Visit (HOSPITAL_COMMUNITY): Payer: Self-pay

## 2020-06-19 ENCOUNTER — Ambulatory Visit: Payer: Self-pay

## 2020-06-19 ENCOUNTER — Other Ambulatory Visit: Payer: Self-pay

## 2020-06-19 ENCOUNTER — Encounter: Payer: Self-pay | Admitting: Internal Medicine

## 2020-06-30 ENCOUNTER — Ambulatory Visit (HOSPITAL_BASED_OUTPATIENT_CLINIC_OR_DEPARTMENT_OTHER): Payer: No Typology Code available for payment source | Admitting: Family Medicine

## 2020-07-02 ENCOUNTER — Encounter: Payer: No Typology Code available for payment source | Admitting: Internal Medicine

## 2020-07-02 NOTE — Progress Notes (Deleted)
New Patient Office Visit  Subjective:  Patient ID: [name], male    DOB: 24-Oct-1985  Age: 35 y.o. MRN: [id number]  CC: No chief complaint on file.   HPI [name] is a 35 year old male with T1DM, HIV, and severe depressioin who presents today for establishment of care.  Past Medical History:  Diagnosis Date  . Anxiety   . Asthma    no prior hospitalizations, intubations  . Depression   . Headache    "only w/stress" (11/01/2017)  . HIV (human immunodeficiency virus infection) ([place])   . Migraine    "a few/month" (11/01/2017)  . Refusal of blood transfusions as patient is Jehovah's Witness   . Seizure ([place])    "only w/my low blood sugars"  (11/01/2017)  . Type I diabetes mellitus ([place])     Past Surgical History:  Procedure Laterality Date  . FRACTURE SURGERY    . IM NAILING TIBIA Right 11/01/2017   INTRAMEDULLARY (IM) NAIL TIBIALRightGeneral  . ORIF ANKLE FRACTURE Right 11/01/2017  . ORIF ANKLE FRACTURE Right 11/01/2017   Procedure: OPEN REDUCTION INTERNAL FIXATION (ORIF) ANKLE FRACTURE;  Surgeon: Shona Needles, MD;  Location: [facility];  Service: Orthopedics;  Laterality: Right;  . TIBIA IM NAIL INSERTION Right 11/01/2017   Procedure: INTRAMEDULLARY (IM) NAIL TIBIAL;  Surgeon: Shona Needles, MD;  Location: [facility];  Service: Orthopedics;  Laterality: Right;    Family History  Problem Relation Age of Onset  . Thyroid disease Mother   . Diabetes Mother   . Breast cancer Maternal Grandmother   . Diabetes Maternal Grandmother     Social History   Socioeconomic History  . Marital status: Single    Spouse name: Not on file  . Number of children: Not on file  . Years of education: Not on file  . Highest education level: Not on file  Occupational History  . Not on file  Tobacco Use  . Smoking status: Current Every Day Smoker    Types: Cigars    Start date: 09/29/2015  . Smokeless tobacco: Never Used  . Tobacco comment: 11/01/2017  "2 cigarettes in ~ 6  month "  Vaping Use  . Vaping Use: Never used  Substance and Sexual Activity  . Alcohol use: Yes    Alcohol/week: 7.0 standard drinks    Types: 7 Cans of beer per week  . Drug use: Not Currently    Frequency: 2.0 times per week    Types: Marijuana    Comment: 11/01/2017  "weekly"  . Sexual activity: Yes    Partners: Male    Comment: pt. declined condoms  Other Topics Concern  . Not on file  Social History Narrative  . Not on file   Social Determinants of Health   Financial Resource Strain: Not on file  Food Insecurity: Not on file  Transportation Needs: Not on file  Physical Activity: Not on file  Stress: Not on file  Social Connections: Not on file  Intimate Partner Violence: Not on file    ROS Review of Systems  Objective:   Today's Vitals: There were no vitals taken for this visit.  Physical Exam  Assessment & Plan:   Problem List Items Addressed This Visit   None     Outpatient Encounter Medications as of 07/02/2020  Medication Sig  . amLODipine (NORVASC) 5 MG tablet Take 5 mg by mouth daily.  . blood glucose meter kit and supplies Dispense based on patient and insurance  preference. Use up to four times daily as directed. (FOR ICD-10 E10.9, E11.9).  Marland Kitchen Blood Glucose Monitoring Suppl (TRUE METRIX METER) w/Device KIT USE AS DIRECTED  . citalopram (CELEXA) 20 MG tablet Take 20 mg by mouth daily.  Marland Kitchen elvitegravir-cobicistat-emtricitabine-tenofovir (GENVOYA) 150-150-200-10 MG TABS tablet Take 1 tablet by mouth daily with breakfast.  . glucose blood test strip USE AS DIRECTED  . insulin aspart (NOVOLOG) 100 UNIT/ML injection INJECT 5 UNITS INTO THE SKIN THREE TIMES DAILY WITH MEALS.  Marland Kitchen insulin glargine (LANTUS) 100 UNIT/ML injection INJECT 26 UNITS INTO THE SKIN DAILY.  Marland Kitchen Insulin Syringe-Needle U-100 30G X 1/2" 0.5 ML MISC USE AS DIRECTED WITH INSULIN  . TRUEplus Lancets 28G MISC USE AS DIRECTED  . [DISCONTINUED] insulin lispro (HUMALOG KWIKPEN) 100 UNIT/ML KwikPen  Inject 0.1 mLs (10 Units total) into the skin 3 (three) times daily with meals. ICD 10 E10.42 (Patient not taking: Reported on 03/31/2019)  . [DISCONTINUED] pravastatin (PRAVACHOL) 20 MG tablet Take 1 tablet (20 mg total) by mouth daily. (Patient not taking: Reported on 03/31/2019)   No facility-administered encounter medications on file as of 07/02/2020.    Follow-up: No follow-ups on file.   Mitzi Hansen, MD

## 2020-07-02 NOTE — Assessment & Plan Note (Deleted)
DIABETES TYPE 2 FOLLOW-UP: Anti-hyperglycemic agents:  Secondary agents: Last A1C: 01/2020 8.8 A1C today  Med Adherence:  []  Yes    []  No Medication side effects:  []  Yes    []  No  Home Monitoring?  []  Yes    []  No Home glucose results range: ** Diet Adherence: []  Yes    []  No Exercise: []  Yes    []  No Hypoglycemic episodes?: yes Numbness of the feet? []  Yes    []  No Retinopathy hx? []  Yes    []  No Last eye exam: ** Last foot exam negative within the past year. Comments: **  Plan: continue current management Follow up 3 mo with A1C recheck

## 2020-08-17 ENCOUNTER — Encounter: Payer: Self-pay | Admitting: Internal Medicine

## 2020-08-17 ENCOUNTER — Encounter: Payer: No Typology Code available for payment source | Admitting: Internal Medicine

## 2020-08-17 NOTE — Progress Notes (Deleted)
   CC: establish care, DM  HPI:  [name] is a 35 y.o.   Past Medical History:  Diagnosis Date   Anxiety    Asthma    no prior hospitalizations, intubations   Depression    Headache    "only w/stress" (11/01/2017)   HIV (human immunodeficiency virus infection) (HCC)    Migraine    "a few/month" (11/01/2017)   Refusal of blood transfusions as patient is Jehovah's Witness    Seizure (HCC)    "only w/my low blood sugars"  (11/01/2017)   Type I diabetes mellitus (HCC)    Review of Systems:   ROS   Physical Exam:  There were no vitals filed for this visit.  Physical Exam General: alert, appears stated age, in no acute distress HEENT: Normocephalic, atraumatic, EOM intact, conjunctiva normal CV: Regular rate and rhythm, no murmurs rubs or gallops Pulm: Clear to auscultation bilaterally, normal work of breathing Abdomen: Soft, nondistended, bowel sounds present, no tenderness to palpation MSK: No lower extremity edema Skin: Warm and dry Neuro: Alert and oriented x3   Assessment & Plan:   See Encounters Tab for problem based charting.  Patient {GC/GE:[id number]::"discussed with","seen with"} Dr. {NAMES:[id number]::"Butcher","Guilloud","Hoffman","Mullen","Narendra","Raines","Vincent"}

## 2020-08-18 ENCOUNTER — Other Ambulatory Visit: Payer: Self-pay

## 2020-08-18 ENCOUNTER — Ambulatory Visit: Payer: Self-pay

## 2020-08-27 ENCOUNTER — Ambulatory Visit: Payer: Self-pay

## 2020-08-27 ENCOUNTER — Other Ambulatory Visit: Payer: Self-pay

## 2020-08-27 NOTE — Progress Notes (Incomplete)
Mental Health Therapist Progress Note   Name: Tameem Pullara  Total time: 60  Type of Service: Individual Outpatient Mental Health Therapy  OBJECTIVE:  Mood: Depressed and Affect: Depressed Risk of harm to self or others: No plan to harm self or others  DIAGNOSIS:   GOALS ADDRESSED:  Patient will:  Reduce symptoms of: depression   Increase knowledge and/or ability of: coping skills   Demonstrate ability to: Increase healthy adjustment to current life circumstances  INTERVENTIONS: Interventions utilized:  Supportive Counseling Therapist met with patient for outpatient mental health individual therapy to include ongoing assessment, support, and reinforcement.  Therapist allowed patient to "check in" since previous session; asking patient to share any positive coping skills they may have used over the previous week, along with any challenges faced.  Therapist provided supportive listening as patient processed their thoughts, emotional responses, and behaviors surrounding several stressors.  EFFECTIVENESS/PLAN: Client was alert, oriented x3, with no SI, HI, or symptoms of psychosis (risk low).  Client was pleasant and friendly, engaging openly and appropriately with therapist, benefiting from supportive listening and exploration of feelings.  Next session recommended in one to two weeks.   Hughes Better, LCSW

## 2020-09-03 ENCOUNTER — Ambulatory Visit: Payer: Self-pay

## 2020-09-09 ENCOUNTER — Encounter (HOSPITAL_COMMUNITY): Payer: Self-pay | Admitting: Emergency Medicine

## 2020-09-09 ENCOUNTER — Emergency Department (HOSPITAL_COMMUNITY): Payer: No Typology Code available for payment source

## 2020-09-09 ENCOUNTER — Inpatient Hospital Stay (HOSPITAL_COMMUNITY)
Admission: EM | Admit: 2020-09-09 | Discharge: 2020-09-11 | DRG: 645 | Discharge status: Against Medical Advice | Payer: No Typology Code available for payment source | Attending: Internal Medicine | Admitting: Internal Medicine

## 2020-09-09 DIAGNOSIS — E785 Hyperlipidemia, unspecified: Secondary | ICD-10-CM | POA: Diagnosis present

## 2020-09-09 DIAGNOSIS — T383X5A Adverse effect of insulin and oral hypoglycemic [antidiabetic] drugs, initial encounter: Secondary | ICD-10-CM

## 2020-09-09 DIAGNOSIS — I10 Essential (primary) hypertension: Secondary | ICD-10-CM | POA: Diagnosis present

## 2020-09-09 DIAGNOSIS — Y92009 Unspecified place in unspecified non-institutional (private) residence as the place of occurrence of the external cause: Secondary | ICD-10-CM

## 2020-09-09 DIAGNOSIS — Z794 Long term (current) use of insulin: Secondary | ICD-10-CM

## 2020-09-09 DIAGNOSIS — Z20822 Contact with and (suspected) exposure to covid-19: Secondary | ICD-10-CM | POA: Diagnosis present

## 2020-09-09 DIAGNOSIS — E160 Drug-induced hypoglycemia without coma: Principal | ICD-10-CM | POA: Diagnosis present

## 2020-09-09 DIAGNOSIS — Z5329 Procedure and treatment not carried out because of patient's decision for other reasons: Secondary | ICD-10-CM | POA: Diagnosis present

## 2020-09-09 DIAGNOSIS — E162 Hypoglycemia, unspecified: Secondary | ICD-10-CM | POA: Diagnosis present

## 2020-09-09 DIAGNOSIS — Z21 Asymptomatic human immunodeficiency virus [HIV] infection status: Secondary | ICD-10-CM | POA: Diagnosis present

## 2020-09-09 DIAGNOSIS — A059 Bacterial foodborne intoxication, unspecified: Secondary | ICD-10-CM | POA: Diagnosis present

## 2020-09-09 DIAGNOSIS — Z803 Family history of malignant neoplasm of breast: Secondary | ICD-10-CM

## 2020-09-09 DIAGNOSIS — Z833 Family history of diabetes mellitus: Secondary | ICD-10-CM

## 2020-09-09 DIAGNOSIS — Z91013 Allergy to seafood: Secondary | ICD-10-CM

## 2020-09-09 DIAGNOSIS — Z79899 Other long term (current) drug therapy: Secondary | ICD-10-CM

## 2020-09-09 DIAGNOSIS — E10649 Type 1 diabetes mellitus with hypoglycemia without coma: Secondary | ICD-10-CM | POA: Diagnosis present

## 2020-09-09 DIAGNOSIS — Z8349 Family history of other endocrine, nutritional and metabolic diseases: Secondary | ICD-10-CM

## 2020-09-09 LAB — URINALYSIS, ROUTINE W REFLEX MICROSCOPIC
Bacteria, UA: NONE SEEN
Bilirubin Urine: NEGATIVE
Glucose, UA: 50 mg/dL — AB
Hgb urine dipstick: NEGATIVE
Ketones, ur: NEGATIVE mg/dL
Leukocytes,Ua: NEGATIVE
Nitrite: NEGATIVE
Protein, ur: >=300 mg/dL — AB
Specific Gravity, Urine: 1.018 (ref 1.005–1.030)
pH: 9 — ABNORMAL HIGH (ref 5.0–8.0)

## 2020-09-09 LAB — GLUCOSE, CAPILLARY
Glucose-Capillary: 110 mg/dL — ABNORMAL HIGH (ref 70–99)
Glucose-Capillary: 96 mg/dL (ref 70–99)
Glucose-Capillary: 198 mg/dL — ABNORMAL HIGH (ref 70–99)

## 2020-09-09 LAB — COMPREHENSIVE METABOLIC PANEL
ALT: 17 U/L (ref 0–44)
AST: 23 U/L (ref 15–41)
Albumin: 3.9 g/dL (ref 3.5–5.0)
Alkaline Phosphatase: 52 U/L (ref 38–126)
Anion gap: 9 (ref 5–15)
BUN: 13 mg/dL (ref 6–20)
CO2: 26 mmol/L (ref 22–32)
Calcium: 9.7 mg/dL (ref 8.9–10.3)
Chloride: 102 mmol/L (ref 98–111)
Creatinine, Ser: 1.2 mg/dL (ref 0.61–1.24)
GFR, Estimated: >60 mL/min (ref >60)
Glucose, Bld: 98 mg/dL (ref 70–99)
Potassium: 4.4 mmol/L (ref 3.5–5.1)
Sodium: 137 mmol/L (ref 135–145)
Total Bilirubin: 0.6 mg/dL (ref 0.3–1.2)
Total Protein: 7.1 g/dL (ref 6.5–8.1)

## 2020-09-09 LAB — CBG MONITORING, ED
Glucose-Capillary: 108 mg/dL — ABNORMAL HIGH (ref 70–99)
Glucose-Capillary: 30 mg/dL — CL (ref 70–99)
Glucose-Capillary: 216 mg/dL — ABNORMAL HIGH (ref 70–99)
Glucose-Capillary: 55 mg/dL — ABNORMAL LOW (ref 70–99)
Glucose-Capillary: 116 mg/dL — ABNORMAL HIGH (ref 70–99)
Glucose-Capillary: 45 mg/dL — ABNORMAL LOW (ref 70–99)
Glucose-Capillary: 51 mg/dL — ABNORMAL LOW (ref 70–99)

## 2020-09-09 LAB — CBC
HCT: 42.5 % (ref 39.0–52.0)
Hemoglobin: 14.7 g/dL (ref 13.0–17.0)
MCH: 31.8 pg (ref 26.0–34.0)
MCHC: 34.6 g/dL (ref 30.0–36.0)
MCV: 92 fL (ref 80.0–100.0)
Platelets: 263 10*3/uL (ref 150–400)
RBC: 4.62 MIL/uL (ref 4.22–5.81)
RDW: 13.5 % (ref 11.5–15.5)
WBC: 9.3 10*3/uL (ref 4.0–10.5)
nRBC: 0 % (ref 0.0–0.2)

## 2020-09-09 LAB — HEMOGLOBIN A1C
Hgb A1c MFr Bld: 9.7 % — ABNORMAL HIGH (ref 4.8–5.6)
Mean Plasma Glucose: 231.69 mg/dL

## 2020-09-09 LAB — LIPASE, BLOOD: Lipase: 23 U/L (ref 11–51)

## 2020-09-09 LAB — SARS CORONAVIRUS 2 (TAT 6-24 HRS): SARS Coronavirus 2: NEGATIVE

## 2020-09-09 MED ORDER — ONDANSETRON HCL 4 MG/2ML IJ SOLN
4.0000 mg | Freq: Four times a day (QID) | INTRAMUSCULAR | Status: DC | PRN
  Administered 2020-09-09: 4 mg via INTRAVENOUS
  Filled 2020-09-09: qty 2

## 2020-09-09 MED ORDER — SODIUM CHLORIDE 0.9 % IV SOLN: INTRAVENOUS | Status: DC

## 2020-09-09 MED ORDER — INSULIN ASPART 100 UNIT/ML IJ SOLN
0.0000 [IU] | Freq: Three times a day (TID) | INTRAMUSCULAR | Status: DC
  Administered 2020-09-10: 1 [IU] via SUBCUTANEOUS

## 2020-09-09 MED ORDER — DEXTROSE 50 % IV SOLN
INTRAVENOUS | Status: AC
  Administered 2020-09-09: 50 mL via INTRAVENOUS
  Filled 2020-09-09: qty 50

## 2020-09-09 MED ORDER — DEXTROSE-NACL 5-0.9 % IV SOLN: INTRAVENOUS | Status: DC

## 2020-09-09 MED ORDER — ACETAMINOPHEN 650 MG RE SUPP: 650.0000 mg | Freq: Four times a day (QID) | RECTAL | Status: DC | PRN

## 2020-09-09 MED ORDER — AMLODIPINE BESYLATE 5 MG PO TABS
5.0000 mg | ORAL_TABLET | Freq: Every day | ORAL | Status: DC
  Administered 2020-09-09 – 2020-09-10 (×2): 5 mg via ORAL
  Filled 2020-09-09 (×3): qty 1

## 2020-09-09 MED ORDER — ONDANSETRON HCL 4 MG PO TABS: 4.0000 mg | ORAL_TABLET | Freq: Four times a day (QID) | ORAL | Status: DC | PRN

## 2020-09-09 MED ORDER — DEXTROSE 50 % IV SOLN
1.0000 | Freq: Once | INTRAVENOUS | Status: AC
  Filled 2020-09-09: qty 50

## 2020-09-09 MED ORDER — HYDRALAZINE HCL 25 MG PO TABS
25.0000 mg | ORAL_TABLET | Freq: Four times a day (QID) | ORAL | Status: DC | PRN
  Administered 2020-09-09: 25 mg via ORAL
  Filled 2020-09-09: qty 1

## 2020-09-09 MED ORDER — ACETAMINOPHEN 325 MG PO TABS: 650.0000 mg | ORAL_TABLET | Freq: Four times a day (QID) | ORAL | Status: DC | PRN

## 2020-09-09 MED ORDER — SODIUM CHLORIDE 0.9 % IV SOLN
25.0000 mg | Freq: Four times a day (QID) | INTRAVENOUS | Status: DC | PRN
  Filled 2020-09-09: qty 1

## 2020-09-09 MED ORDER — DEXTROSE 5 % IV SOLN: Freq: Once | INTRAVENOUS | Status: AC

## 2020-09-09 NOTE — ED Triage Notes (Addendum)
Patient BIB [facility] from home with complaint of abdominal pain, nausea, and vomiting that started this morning at 0200. Patient also found to be hypoglycemic with EMS, CBG 51. Given 200 mL of D10 IV PTA, last CBG with EMS 130. Patient's CBG on arrival to ED 108. Patient is alert, oriented, and in no apparent distress at this time.  Patient also given 4mg  zofran PTA with no improvement of symptoms.

## 2020-09-09 NOTE — ED Provider Notes (Signed)
[facility] EMERGENCY DEPARTMENT Provider Note   CSN: [id number] Arrival date & time: 09/09/20  0913     History Chief Complaint  Patient presents with   Abdominal Pain    [name] is a 35 y.o. male.   Abdominal Pain  This patient is a 35 year old male with a known history of type 1 diabetes on insulin, he has HIV, he also has a history of recurrent hypoglycemia depression.  He presents to the hospital with a complaint of nausea and vomiting and some diarrhea.  This awoke him at 2:00 in the morning with abdominal discomfort with vomiting.  He has had 2 episodes of loose stools and multiple episodes of vomiting and dry heaving.  He was found to be hypoglycemic by EMS with a blood sugar of 51, he was given some IV fluids with D10, he states that the pain in his abdomen is diffuse, crampy but seems to be focused in the right upper quadrant more intensely.  There is no fevers or chills, no coughing or shortness of breath, he does not recall being sick around anybody else or having anybody around him that was ill, he ate at a food truck last night but the person he ate with is not sick.  He was given some medicines prehospital and feels better right the second but still has waves of nausea.  No use of tobacco or marijuana, he does drink occasional alcohol but did not last night.  Past Medical History:  Diagnosis Date   Anxiety    Asthma    no prior hospitalizations, intubations   Depression    Headache    "only w/stress" (11/01/2017)   HIV (human immunodeficiency virus infection) ([place])    Migraine    "a few/month" (11/01/2017)   Refusal of blood transfusions as patient is Jehovah's Witness    Seizure ([place])    "only w/my low blood sugars"  (11/01/2017)   Type I diabetes mellitus ([place])     Patient Active Problem List   Diagnosis Date Noted   Aggressive behavior    Altered mental status 02/09/2020   Suicidal ideation 02/09/2020   MDD (major depressive  disorder), recurrent episode, severe ([place]) 01/15/2020   SIRS (systemic inflammatory response syndrome) ([place])    Healthcare maintenance 12/05/2019   Hypoglycemia 03/31/2019   Syphilis 01/15/2019   Closed fracture of base of fifth metatarsal bone of right foot 11/13/2017   Closed displaced comminuted fracture of shaft of right tibia 11/01/2017   Closed fracture of medial malleolus of right ankle 10/31/2017   Vaccine counseling 12/14/2016   Screening examination for venereal disease 06/09/2015   Encounter for long-term (current) use of medications 06/09/2015   Depression 03/04/2015   Dizziness and giddiness 03/04/2015   Tobacco dependence 11/12/2014   HIV infection ([place]) 11/11/2014   Stress 12/19/2012   Asthma 10/02/2010   Diabetes mellitus type 1 ([place]) 03/01/1991    Past Surgical History:  Procedure Laterality Date   FRACTURE SURGERY     IM NAILING TIBIA Right 11/01/2017   INTRAMEDULLARY (IM) NAIL TIBIALRightGeneral   ORIF ANKLE FRACTURE Right 11/01/2017   ORIF ANKLE FRACTURE Right 11/01/2017   Procedure: OPEN REDUCTION INTERNAL FIXATION (ORIF) ANKLE FRACTURE;  Surgeon: Shona Needles, MD;  Location: [place];  Service: Orthopedics;  Laterality: Right;   TIBIA IM NAIL INSERTION Right 11/01/2017   Procedure: INTRAMEDULLARY (IM) NAIL TIBIAL;  Surgeon: Shona Needles, MD;  Location: [place];  Service: Orthopedics;  Laterality: Right;  Family History  Problem Relation Age of Onset   Thyroid disease Mother    Diabetes Mother    Breast cancer Maternal Grandmother    Diabetes Maternal Grandmother     Social History   Tobacco Use   Smoking status: Every Day    Pack years: 0.00    Types: Cigars    Start date: 09/29/2015   Smokeless tobacco: Never   Tobacco comments:    11/01/2017  "2 cigarettes in ~ 6 month "  Vaping Use   Vaping Use: Never used  Substance Use Topics   Alcohol use: Yes    Alcohol/week: 7.0 standard drinks    Types: 7 Cans of beer per week   Drug use: Not  Currently    Frequency: 2.0 times per week    Types: Marijuana    Comment: 11/01/2017  "weekly"    Home Medications Prior to Admission medications   Medication Sig Start Date End Date Taking? Authorizing Provider  amLODipine (NORVASC) 5 MG tablet Take 5 mg by mouth daily.    [provider]  blood glucose meter kit and supplies Dispense based on patient and insurance preference. Use up to four times daily as directed. (FOR ICD-10 E10.9, E11.9). Patient taking differently: 1 each by Other route See admin instructions. Dispense based on patient and insurance preference. Use up to four times daily as directed. (FOR ICD-10 E10.9, E11.9). 02/11/20   Domenic Polite, MD  Blood Glucose Monitoring Suppl (TRUE METRIX METER) w/Device KIT USE AS DIRECTED Patient taking differently: 1 each by Other route as directed. 02/11/20 02/10/21  Domenic Polite, MD  citalopram (CELEXA) 20 MG tablet Take 20 mg by mouth daily.    [provider]  elvitegravir-cobicistat-emtricitabine-tenofovir (GENVOYA) 150-150-200-10 MG TABS tablet Take 1 tablet by mouth daily with breakfast. 05/04/20   Mignon Pine, DO  glucose blood test strip USE AS DIRECTED Patient taking differently: 1 each by Other route as directed. 02/11/20 02/10/21  Domenic Polite, MD  insulin aspart (NOVOLOG) 100 UNIT/ML injection INJECT 5 UNITS INTO THE SKIN THREE TIMES DAILY WITH MEALS. Patient taking differently: Inject 5 Units into the skin 3 (three) times daily with meals. 02/11/20 02/10/21  Domenic Polite, MD  insulin glargine (LANTUS) 100 UNIT/ML injection INJECT 26 UNITS INTO THE SKIN DAILY. Patient taking differently: Inject 26 Units into the skin daily. 02/11/20 02/10/21  Domenic Polite, MD  Insulin Syringe-Needle U-100 30G X 1/2" 0.5 ML MISC USE AS DIRECTED WITH INSULIN Patient taking differently: 1 each by Other route See admin instructions. with insulin 02/11/20 02/10/21  Domenic Polite, MD  TRUEplus Lancets 28G MISC  USE AS DIRECTED Patient taking differently: 1 each by Other route as directed. 02/11/20 02/10/21  Domenic Polite, MD  insulin lispro (HUMALOG KWIKPEN) 100 UNIT/ML KwikPen Inject 0.1 mLs (10 Units total) into the skin 3 (three) times daily with meals. ICD 10 E10.42 Patient not taking: Reported on 03/31/2019 03/08/19 07/08/19  Gildardo Pounds, NP  pravastatin (PRAVACHOL) 20 MG tablet Take 1 tablet (20 mg total) by mouth daily. Patient not taking: Reported on 03/31/2019 03/08/19 07/08/19  Gildardo Pounds, NP    Allergies    Shellfish allergy and Aspirin  Review of Systems   Review of Systems  Gastrointestinal:  Positive for abdominal pain.  All other systems reviewed and are negative.  Physical Exam Updated Vital Signs BP (!) 147/97 (BP Location: Right Arm)   Pulse 71   Temp (!) 97.4 F (36.3 C) (Oral)   Resp 20  SpO2 100%   Physical Exam Vitals and nursing note reviewed.  Constitutional:      General: He is not in acute distress.    Appearance: He is well-developed.  HENT:     Head: Normocephalic and atraumatic.     Mouth/Throat:     Pharynx: No oropharyngeal exudate.  Eyes:     General: No scleral icterus.       Right eye: No discharge.        Left eye: No discharge.     Conjunctiva/sclera: Conjunctivae normal.     Pupils: Pupils are equal, round, and reactive to light.  Neck:     Thyroid: No thyromegaly.     Vascular: No JVD.  Cardiovascular:     Rate and Rhythm: Normal rate and regular rhythm.     Heart sounds: Normal heart sounds. No murmur heard.   No friction rub. No gallop.  Pulmonary:     Effort: Pulmonary effort is normal. No respiratory distress.     Breath sounds: Normal breath sounds. No wheezing or rales.  Abdominal:     General: Bowel sounds are normal. There is no distension.     Palpations: Abdomen is soft. There is no mass.     Tenderness: There is abdominal tenderness.  Musculoskeletal:        General: No tenderness. Normal range of motion.      Cervical back: Normal range of motion and neck supple.  Lymphadenopathy:     Cervical: No cervical adenopathy.  Skin:    General: Skin is warm and dry.     Findings: No erythema or rash.  Neurological:     Mental Status: He is alert.     Coordination: Coordination normal.  Psychiatric:        Behavior: Behavior normal.    ED Results / Procedures / Treatments   Labs (all labs ordered are listed, but only abnormal results are displayed) Labs Reviewed  URINALYSIS, ROUTINE W REFLEX MICROSCOPIC - Abnormal; Notable for the following components:      Result Value   pH 9.0 (*)    Glucose, UA 50 (*)    Protein, ur >=300 (*)    All other components within normal limits  CBG MONITORING, ED - Abnormal; Notable for the following components:   Glucose-Capillary 30 (*)    All other components within normal limits  CBG MONITORING, ED - Abnormal; Notable for the following components:   Glucose-Capillary 216 (*)    All other components within normal limits  CBG MONITORING, ED - Abnormal; Notable for the following components:   Glucose-Capillary 108 (*)    All other components within normal limits  CBG MONITORING, ED - Abnormal; Notable for the following components:   Glucose-Capillary 55 (*)    All other components within normal limits  SARS CORONAVIRUS 2 (TAT 6-24 HRS)  LIPASE, BLOOD  COMPREHENSIVE METABOLIC PANEL  CBC    EKG EKG Interpretation  Date/Time:  Wednesday September 09 2020 09:17:05 EDT Ventricular Rate:  77 PR Interval:  148 QRS Duration: 97 QT Interval:  421 QTC Calculation: 477 R Axis:   79 Text Interpretation: Sinus rhythm Anteroseptal infarct, old since 01/14/20, no significant changes seen Confirmed by Noemi Chapel [phone]) on 09/09/2020 1:19:29 PM  Radiology US Abdomen Limited  Result Date: 09/09/2020 CLINICAL DATA:  Right upper quadrant pain EXAM: ULTRASOUND ABDOMEN LIMITED RIGHT UPPER QUADRANT COMPARISON:  None. FINDINGS: Gallbladder: No gallstones or wall  thickening visualized. No sonographic Murphy sign noted by sonographer. Common  bile duct: Diameter: Normal caliber, 3 mm Liver: No focal lesion identified. Within normal limits in parenchymal echogenicity. Portal vein is patent on color Doppler imaging with normal direction of blood flow towards the liver. Other: None. IMPRESSION: Normal right upper quadrant ultrasound. Electronically Signed   By: Rolm Baptise M.D.   On: 09/09/2020 10:31    Procedures .Critical Care  Date/Time: 09/09/2020 2:45 PM Performed by: Noemi Chapel, MD Authorized by: Noemi Chapel, MD   Critical care provider statement:    Critical care time (minutes):  35   Critical care time was exclusive of:  Separately billable procedures and treating other patients and teaching time   Critical care was necessary to treat or prevent imminent or life-threatening deterioration of the following conditions:  Endocrine crisis   Critical care was time spent personally by me on the following activities:  Blood draw for specimens, development of treatment plan with patient or surrogate, discussions with consultants, evaluation of patient's response to treatment, examination of patient, obtaining history from patient or surrogate, ordering and performing treatments and interventions, ordering and review of laboratory studies, ordering and review of radiographic studies, pulse oximetry, re-evaluation of patient's condition and review of old charts Comments:         Medications Ordered in ED Medications  promethazine (PHENERGAN) 25 mg in sodium chloride 0.9 % 50 mL IVPB (has no administration in time range)  0.9 %  sodium chloride infusion (0 mLs Intravenous Hold 09/09/20 1000)  dextrose 5 %-0.9 % sodium chloride infusion ( Intravenous Restarted 09/09/20 1450)  dextrose 5 % solution (has no administration in time range)  dextrose 50 % solution 50 mL (50 mLs Intravenous Given 09/09/20 1119)    ED Course  I have reviewed the triage vital  signs and the nursing notes.  Pertinent labs & imaging results that were available during my care of the patient were reviewed by me and considered in my medical decision making (see chart for details).  Clinical Course as of 09/09/20 1502  Wed Sep 09, 2020  1123 This patient was reevaluated several times, he was very hypoglycemic, his glucose measured at less than 30 at 11:00, he was given D50, he was severely diaphoretic at that time.  He is now back to normal and placed on a D5 normal saline drip [BM]    Clinical Course User Index [BM] Noemi Chapel, MD   MDM Rules/Calculators/A&P                          The patient's vital signs are unremarkable except for some hypertension, he is not tachycardic his mucous membranes are normal his abdomen is tender mostly in the right upper quadrant, labs will be ordered as well as a right upper quadrant ultrasound.  The patient is agreeable, NPO.  Laboratory work-up reveals that the patient has a recurrent hypoglycemia.  After having a blood sugar of 30 he was placed on a D5 drip after getting a D50 injection.  His blood sugar went up to 216, he was given food, it then dropped down to 55.  He will be placed on a D5 drip again and admitted to the hospital.  His metabolic panel was unremarkable, his CBC counts were unremarkable, his ultrasound showed no findings in the right upper quadrant.  COVID pending  Will admit to the hospital for ongoing recurrent hypoglycemia on a D5 drip, critical care provided  D/w Dr. Roosevelt Locks - will admit  Final Clinical  Impression(s) / ED Diagnoses Final diagnoses:  Hypoglycemia     Noemi Chapel, MD 09/09/20 [phone]

## 2020-09-09 NOTE — ED Notes (Signed)
Pt still presenting with low blood sugar at 51, given orange juice x2.

## 2020-09-09 NOTE — H&P (Signed)
History and Physical    [name] OEU:[id number] DOB: 08/02/1985 DOA: 09/09/2020  PCP: Patient, No Pcp Per (Inactive) (Confirm with patient/family/NH records and if not entered, this has to be entered at [facility] point of entry) Patient coming from: Home  I have personally briefly reviewed patient's old medical records in [place]  Chief Complaint: Feeling weak, abdominal pain, N/V diarrhea.  HPI: [name] is a 35 y.o. male with medical history significant of HIV on HAART, IDDM with frequent episodes of hypoglycemia, HTN, HLD, presented with acute nauseous vomiting diarrhea abdominal pain and hypoglycemia.  Patient ate at a food truck in [place] area and in 3 hours, he started to have right-sided cramping-like abdominal pain, then severe watery diarrhea x2 in 2 hours, along with strong feeling of nausea and vomited 4-5 times of stomach content.  Patient last received insulin 45 units Lantus and 6 units Humalog last night.  He felt very weak and dry this morning, unable to eat anything fearing of more N/V. He called 911 this morning.  Nurse arrived found his FINGERSTICK 51.  At baseline, patient reported that he does not follow any PCP for his diabetes, has been using Lantus 45 units at bedtime and Humalog 6 units 3 times daily AC.  And he has had frequent episodes of hypoglycemia almost every morning, sometimes with symptoms of dizziness and sweating.  When asked what okay with insulin, patient reported that he received insulin from his admissions recently from hospital.  ED Course: RUQ U/S negative for acute findings.  Glucose 30>50 after given D50 and 5 drip started in ED.  Review of Systems: As per HPI otherwise 14 point review of systems negative.    Past Medical History:  Diagnosis Date   Anxiety    Asthma    no prior hospitalizations, intubations   Depression    Headache    "only w/stress" (11/01/2017)   HIV (human immunodeficiency virus infection)  ([place])    Migraine    "a few/month" (11/01/2017)   Refusal of blood transfusions as patient is Jehovah's Witness    Seizure ([place])    "only w/my low blood sugars"  (11/01/2017)   Type I diabetes mellitus ([place])     Past Surgical History:  Procedure Laterality Date   FRACTURE SURGERY     IM NAILING TIBIA Right 11/01/2017   INTRAMEDULLARY (IM) NAIL TIBIALRightGeneral   ORIF ANKLE FRACTURE Right 11/01/2017   ORIF ANKLE FRACTURE Right 11/01/2017   Procedure: OPEN REDUCTION INTERNAL FIXATION (ORIF) ANKLE FRACTURE;  Surgeon: Shona Needles, MD;  Location: [place];  Service: Orthopedics;  Laterality: Right;   TIBIA IM NAIL INSERTION Right 11/01/2017   Procedure: INTRAMEDULLARY (IM) NAIL TIBIAL;  Surgeon: Shona Needles, MD;  Location: [place];  Service: Orthopedics;  Laterality: Right;     reports that he has been smoking cigars. He started smoking about 4 years ago. He has never used smokeless tobacco. He reports current alcohol use of about 7.0 standard drinks of alcohol per week. He reports previous drug use. Frequency: 2.00 times per week. Drug: Marijuana.  Allergies  Allergen Reactions   Shellfish Allergy Anaphylaxis and Hives   Aspirin Other (See Comments)    Unknown reaction    Family History  Problem Relation Age of Onset   Thyroid disease Mother    Diabetes Mother    Breast cancer Maternal Grandmother    Diabetes Maternal Grandmother      Prior to Admission medications   Medication  Sig Start Date End Date Taking? Authorizing Provider  amLODipine (NORVASC) 5 MG tablet Take 5 mg by mouth daily.    [provider]  blood glucose meter kit and supplies Dispense based on patient and insurance preference. Use up to four times daily as directed. (FOR ICD-10 E10.9, E11.9). Patient taking differently: 1 each by Other route See admin instructions. Dispense based on patient and insurance preference. Use up to four times daily as directed. (FOR ICD-10 E10.9, E11.9). 02/11/20   Domenic Polite, MD  Blood Glucose Monitoring Suppl (TRUE METRIX METER) w/Device KIT USE AS DIRECTED Patient taking differently: 1 each by Other route as directed. 02/11/20 02/10/21  Domenic Polite, MD  citalopram (CELEXA) 20 MG tablet Take 20 mg by mouth daily.    [provider]  elvitegravir-cobicistat-emtricitabine-tenofovir (GENVOYA) 150-150-200-10 MG TABS tablet Take 1 tablet by mouth daily with breakfast. 05/04/20   Mignon Pine, DO  glucose blood test strip USE AS DIRECTED Patient taking differently: 1 each by Other route as directed. 02/11/20 02/10/21  Domenic Polite, MD  insulin aspart (NOVOLOG) 100 UNIT/ML injection INJECT 5 UNITS INTO THE SKIN THREE TIMES DAILY WITH MEALS. Patient taking differently: Inject 5 Units into the skin 3 (three) times daily with meals. 02/11/20 02/10/21  Domenic Polite, MD  insulin glargine (LANTUS) 100 UNIT/ML injection INJECT 26 UNITS INTO THE SKIN DAILY. Patient taking differently: Inject 26 Units into the skin daily. 02/11/20 02/10/21  Domenic Polite, MD  Insulin Syringe-Needle U-100 30G X 1/2" 0.5 ML MISC USE AS DIRECTED WITH INSULIN Patient taking differently: 1 each by Other route See admin instructions. with insulin 02/11/20 02/10/21  Domenic Polite, MD  TRUEplus Lancets 28G MISC USE AS DIRECTED Patient taking differently: 1 each by Other route as directed. 02/11/20 02/10/21  Domenic Polite, MD  insulin lispro (HUMALOG KWIKPEN) 100 UNIT/ML KwikPen Inject 0.1 mLs (10 Units total) into the skin 3 (three) times daily with meals. ICD 10 E10.42 Patient not taking: Reported on 03/31/2019 03/08/19 07/08/19  Gildardo Pounds, NP  pravastatin (PRAVACHOL) 20 MG tablet Take 1 tablet (20 mg total) by mouth daily. Patient not taking: Reported on 03/31/2019 03/08/19 07/08/19  Gildardo Pounds, NP    Physical Exam: Vitals:   09/09/20 1245 09/09/20 1300 09/09/20 1315 09/09/20 1444  BP: 129/89 128/84 133/84 (!) 147/97  Pulse: 79 74 71 71  Resp:  16  20   Temp:      TempSrc:      SpO2: 100% 100% 100% 100%    Constitutional: NAD, calm, comfortable Vitals:   09/09/20 1245 09/09/20 1300 09/09/20 1315 09/09/20 1444  BP: 129/89 128/84 133/84 (!) 147/97  Pulse: 79 74 71 71  Resp:  16  20  Temp:      TempSrc:      SpO2: 100% 100% 100% 100%   Eyes: PERRL, lids and conjunctivae normal ENMT: Mucous membranes are moist. Posterior pharynx clear of any exudate or lesions.Normal dentition.  Neck: normal, supple, no masses, no thyromegaly Respiratory: clear to auscultation bilaterally, no wheezing, no crackles. Normal respiratory effort. No accessory muscle use.  Cardiovascular: Regular rate and rhythm, no murmurs / rubs / gallops. No extremity edema. 2+ pedal pulses. No carotid bruits.  Abdomen: no tenderness, no masses palpated. No hepatosplenomegaly. Bowel sounds positive.  Musculoskeletal: no clubbing / cyanosis. No joint deformity upper and lower extremities. Good ROM, no contractures. Normal muscle tone.  Skin: no rashes, lesions, ulcers. No induration Neurologic: CN 2-12 grossly intact. Sensation intact, DTR  normal. Strength 5/5 in all 4.  Psychiatric: Normal judgment and insight. Alert and oriented x 3. Normal mood.     Labs on Admission: I have personally reviewed following labs and imaging studies  CBC: Recent Labs  Lab 09/09/20 0922  WBC 9.3  HGB 14.7  HCT 42.5  MCV 92.0  PLT 161   Basic Metabolic Panel: Recent Labs  Lab 09/09/20 0922  NA 137  K 4.4  CL 102  CO2 26  GLUCOSE 98  BUN 13  CREATININE 1.20  CALCIUM 9.7   GFR: CrCl cannot be calculated (Unknown ideal weight.). Liver Function Tests: Recent Labs  Lab 09/09/20 0922  AST 23  ALT 17  ALKPHOS 52  BILITOT 0.6  PROT 7.1  ALBUMIN 3.9   Recent Labs  Lab 09/09/20 0922  LIPASE 23   No results for input(s): AMMONIA in the last 168 hours. Coagulation Profile: No results for input(s): INR, PROTIME in the last 168 hours. Cardiac Enzymes: No results  for input(s): CKTOTAL, CKMB, CKMBINDEX, TROPONINI in the last 168 hours. BNP (last 3 results) No results for input(s): PROBNP in the last 8760 hours. HbA1C: No results for input(s): HGBA1C in the last 72 hours. CBG: Recent Labs  Lab 09/09/20 0912 09/09/20 1110 09/09/20 1144 09/09/20 1439  GLUCAP 108* 30* 216* 55*   Lipid Profile: No results for input(s): CHOL, HDL, LDLCALC, TRIG, CHOLHDL, LDLDIRECT in the last 72 hours. Thyroid Function Tests: No results for input(s): TSH, T4TOTAL, FREET4, T3FREE, THYROIDAB in the last 72 hours. Anemia Panel: No results for input(s): VITAMINB12, FOLATE, FERRITIN, TIBC, IRON, RETICCTPCT in the last 72 hours. Urine analysis:    Component Value Date/Time   COLORURINE YELLOW 09/09/2020 0917   APPEARANCEUR CLEAR 09/09/2020 0917   LABSPEC 1.018 09/09/2020 0917   PHURINE 9.0 (H) 09/09/2020 0917   GLUCOSEU 50 (A) 09/09/2020 0917   HGBUR NEGATIVE 09/09/2020 0917   BILIRUBINUR NEGATIVE 09/09/2020 0917   KETONESUR NEGATIVE 09/09/2020 0917   PROTEINUR >=300 (A) 09/09/2020 0917   UROBILINOGEN 0.2 02/10/2015 1416   NITRITE NEGATIVE 09/09/2020 0917   LEUKOCYTESUR NEGATIVE 09/09/2020 0917    Radiological Exams on Admission: US Abdomen Limited  Result Date: 09/09/2020 CLINICAL DATA:  Right upper quadrant pain EXAM: ULTRASOUND ABDOMEN LIMITED RIGHT UPPER QUADRANT COMPARISON:  None. FINDINGS: Gallbladder: No gallstones or wall thickening visualized. No sonographic Murphy sign noted by sonographer. Common bile duct: Diameter: Normal caliber, 3 mm Liver: No focal lesion identified. Within normal limits in parenchymal echogenicity. Portal vein is patent on color Doppler imaging with normal direction of blood flow towards the liver. Other: None. IMPRESSION: Normal right upper quadrant ultrasound. Electronically Signed   By: Rolm Baptise M.D.   On: 09/09/2020 10:31    EKG: Independently reviewed. Sinus, chronic ST-T changes on chest  leads.  Assessment/Plan Active Problems:   Hypoglycemia due to insulin   Hypoglycemia  (please populate well all problems here in Problem List. (For example, if patient is on BP meds at home and you resume or decide to hold them, it is a problem that needs to be her. Same for CAD, COPD, HLD and so on)  Hypoglycemia -Likely secondary to an over stringent insulin regimen -Continue D5 drip -Check A1c, add thin sliding scale. -Consult case management for installation PCP for diabetes follow-up.  Nauseous vomit and diarrhea and abdominal pain -Suspect food toxin.  Abdominal pain resolved.  Diarrhea improved and no more feeling of nausea or vomit.  Trial of diet. -Abdominal ultrasound reassuring.  HTN -Continue home meds  HLD -Statin.  HIV -Stable, on HAART  DVT prophylaxis: Lovenox Code Status: Full Code Family Communication: None at bedside Disposition Plan: Expect less than 2 midnight hospital stay Consults called: None Admission status: Tele obs   Lequita Halt MD [facility] Pager [phone]  09/09/2020, 4:13 PM

## 2020-09-09 NOTE — ED Notes (Signed)
Checked patient cbg it was 30 notified RN Clark of blood sugar

## 2020-09-09 NOTE — ED Notes (Signed)
Got patient on the monitor did ekg got patient a blanket patient is resting with call bell in reach

## 2020-09-09 NOTE — ED Notes (Signed)
Checked patient cbg it was 108 notified RN Clark of blood sugar

## 2020-09-09 NOTE — ED Notes (Signed)
Checked patient cbg it was 216 notified RN Clark of blood sugar patient is resting with call bell in reqach

## 2020-09-09 NOTE — ED Notes (Signed)
Pt presents with low blood sugar, fluids changed, orange juice x2, cheese stick and graham crackers given.

## 2020-09-09 NOTE — ED Notes (Signed)
Checked patient cbg it was 15 notified RN Clark of blood sugar patient is resting with call bell in reach

## 2020-09-10 LAB — GLUCOSE, CAPILLARY
Glucose-Capillary: 131 mg/dL — ABNORMAL HIGH (ref 70–99)
Glucose-Capillary: 291 mg/dL — ABNORMAL HIGH (ref 70–99)
Glucose-Capillary: 397 mg/dL — ABNORMAL HIGH (ref 70–99)
Glucose-Capillary: 242 mg/dL — ABNORMAL HIGH (ref 70–99)
Glucose-Capillary: 245 mg/dL — ABNORMAL HIGH (ref 70–99)

## 2020-09-10 MED ORDER — INSULIN ASPART 100 UNIT/ML IJ SOLN
3.0000 [IU] | Freq: Once | INTRAMUSCULAR | Status: AC
  Administered 2020-09-10: 3 [IU] via SUBCUTANEOUS

## 2020-09-10 MED ORDER — INSULIN ASPART 100 UNIT/ML IJ SOLN
0.0000 [IU] | Freq: Three times a day (TID) | INTRAMUSCULAR | Status: DC
  Administered 2020-09-10: 3 [IU] via SUBCUTANEOUS

## 2020-09-10 MED ORDER — INSULIN GLARGINE 100 UNIT/ML ~~LOC~~ SOLN
18.0000 [IU] | Freq: Every day | SUBCUTANEOUS | Status: DC
  Administered 2020-09-10: 18 [IU] via SUBCUTANEOUS
  Filled 2020-09-10 (×2): qty 0.18

## 2020-09-10 NOTE — TOC Initial Note (Signed)
Transition of Care [facility]) - Initial/Assessment Note    Patient Details  Name: [name] MRN: [id number] Date of Birth: 1985/06/17  Transition of Care [facility]) CM/SW Contact:    Lockie Pares, RN Phone Number: 09/10/2020, 12:12 PM  Clinical Narrative:                  Patient admitted for abdominal pain, N&V known diabetic. Has had periods of hypoglycemia while in the hospital, Diabetes inpatient consulted for recommendations. 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 Attempted to call patient in room to obtain more information, no answer at this time. Appointment made for Follow up with [facility]. PCP, placed in patient instructions. Patient may need MATCH for medications.   Expected Discharge Plan: Home/Self Care Barriers to Discharge: Inadequate or no insurance   Patient Goals and CMS Choice        Expected Discharge Plan and Services Expected Discharge Plan: Home/Self Care   Discharge Planning Services: CM Consult, MATCH Program, Follow-up appt scheduled   Living arrangements for the past 2 months: Apartment                                      Prior Living Arrangements/Services Living arrangements for the past 2 months: Apartment   Patient language and need for interpreter reviewed:: Yes        Need for Family Participation in Patient Care: Yes (Comment) Care giver support system in place?: Yes (comment)   Criminal Activity/Legal Involvement Pertinent to Current Situation/Hospitalization: No - Comment as needed  Activities of Daily Living      Permission Sought/Granted                  Emotional Assessment       Orientation: : Oriented to Self, Oriented to Place, Oriented to  Time, Oriented to Situation Alcohol / Substance Use: Not Applicable Psych Involvement: No (comment)  Admission diagnosis:  Hypoglycemia [E16.2] Patient Active Problem List   Diagnosis Date Noted   Hypoglycemia 09/09/2020   Aggressive behavior     Altered mental status 02/09/2020   Suicidal ideation 02/09/2020   MDD (major depressive disorder), recurrent episode, severe (HCC) 01/15/2020   SIRS (systemic inflammatory response syndrome) (HCC)    Healthcare maintenance 12/05/2019   Hypoglycemia due to insulin 03/31/2019   Syphilis 01/15/2019   Closed fracture of base of fifth metatarsal bone of right foot 11/13/2017   Closed displaced comminuted fracture of shaft of right tibia 11/01/2017   Closed fracture of medial malleolus of right ankle 10/31/2017   Vaccine counseling 12/14/2016   Screening examination for venereal disease 06/09/2015   Encounter for long-term (current) use of medications 06/09/2015   Depression 03/04/2015   Dizziness and giddiness 03/04/2015   Tobacco dependence 11/12/2014   HIV infection (HCC) 11/11/2014   Stress 12/19/2012   Asthma 10/02/2010   Diabetes mellitus type 1 (HCC) 03/01/1991   PCP:  Patient, No Pcp Per (Inactive) Pharmacy:   [id number] [place], [place] - [address] AT [facility] OF [address] [place]  [address] Phone: [phone] Fax: [phone]  [name] [address] [place] [address] Phone: [phone] Fax: [phone]  [facility] [address] [place] [address] Phone: [phone] Fax: [phone]     Social Determinants of Health (  SDOH) Interventions    Readmission Risk Interventions Readmission Risk Prevention Plan 01/15/2020  Transportation Screening Complete  PCP or Specialist Appt within 5-7 Days Complete  Home Care Screening Complete  Medication Review (RN CM) Complete  Some recent data might be hidden

## 2020-09-10 NOTE — Progress Notes (Signed)
Inpatient Diabetes Program Recommendations  AACE/ADA: New Consensus Statement on Inpatient Glycemic Control (2015)  Target Ranges:  Prepandial:   less than 140 mg/dL      Peak postprandial:   less than 180 mg/dL (1-2 hours)      Critically ill patients:  140 - 180 mg/dL   Results for [name], [name] (MRN [id number]) as of 09/10/2020 08:47  Ref. Range 09/09/2020 09:12 09/09/2020 11:10 09/09/2020 11:44 09/09/2020 14:39 09/09/2020 16:32 09/09/2020 16:55 09/09/2020 17:27 09/09/2020 18:01 09/09/2020 19:36 09/09/2020 22:07  Glucose-Capillary Latest Ref Range: 70 - 99 mg/dL 347 (H) 30 (LL) 425 (H) 55 (L) 45 (L) 51 (L) 116 (H) 110 (H) 96 198 (H)   Results for [name], [name] (MRN [id number]) as of 09/10/2020 08:47  Ref. Range 09/10/2020 06:03  Glucose-Capillary Latest Ref Range: 70 - 99 mg/dL 332 (H)    Admit with: Hypoglycemia  History: Type 1 Diabetes  Home DM Meds: Lantus 26 units Daily       Novolog 5 units TID  Current Orders: No Insulin Orders yet    D5 NS IVF stopped yesterday at 4:37pm  Per Chart Review, has been told numerous times that he needs to get established with PCP who can assist him with better diabetes management.  Dr. Earlene Plater with Infectious Disease team advised pt on 05/04/2020 to follow [facility] with [facility] and pt was referred there.  Do not see that pt made an appt with the [facility].   MD- If pt truly Type 1 diabetic, will need basal insulin back on board at some point very soon, to prevent DKA.  When restarting Insulin, would start with weight based dosing approach:  Lantus 18 units Daily (0.2 units/kg) (last weight on file was 87.5kg)  Novolog Sensitive Correction Scale/ SSI (0-9 units) TID AC + HS  Novolog 3 units TID with meals for Meal Coverage     --Will follow patient during hospitalization--  Ambrose Finland RN, MSN, CDE Diabetes Coordinator Inpatient Glycemic Control Team Team Pager: [phone] (8a-5p)   CBG 131 this AM

## 2020-09-10 NOTE — Progress Notes (Signed)
PROGRESS NOTE    [name]  PJK:[id number] DOB: April 11, 1985 DOA: 09/09/2020 PCP: Patient, No Pcp Per (Inactive)   Brief Narrative: 35 year old with past medical history significant for HIV on HAART, type I diabetics,  with frequent episode of hypoglycemia, who presents with nausea vomiting abdominal pain after he ate at a food truck. He was also feeling very weak, his blood sugar was low at 51.  Admitted with hypoglycemia.    Assessment & Plan:   Active Problems:   Hypoglycemia due to insulin   Hypoglycemia   1-Diabetes type 1, with hypoglycemia:  Secondary to insulin and inconsistent diet.  He was on D 5, CBG as low as 30.  Hypoglycemia resolved. Plan to stop D 5.  Plan to start Lantus. He is type one.  Plan to start SSI. Inconsistent oral intake.  Hb A1c at 9.   2-Nausea, vomiting. Acute on chronic.  Initial presentation related to food poisoning.  He report history of vomiting intermittently.  Plan to get Gastric Empty study. If positive might need reglan.   3-HTN; Continue with Norvasc.   4-HIV; Continue with HAART.     Estimated body mass index is 25.45 kg/m as calculated from the following:   Height as of 02/09/20: 6\' 1"  (1.854 m).   Weight as of 02/09/20: 87.5 kg.   DVT prophylaxis: SCD Code Status: Full Code Family Communication: Care discussed with patient.  Disposition Plan:  Status is: Observation  The patient remains OBS appropriate and will d/c before 2 midnights.  Dispo: The patient is from: Home              Anticipated d/c is to: Home              Patient currently is not medically stable to d/c.   Difficult to place patient No        Consultants:  None  Procedures:  None  Antimicrobials:    Subjective: He report nausea and vomiting at times, if he force himself to eat.    Objective: Vitals:   09/09/20 1759 09/09/20 1944 09/09/20 2343 09/10/20 0411  BP: (!) 166/113 (!) 159/109 139/87 (!) 140/93  Pulse: 77 80  74 79  Resp: 16 18 16 16   Temp: 99.6 F (37.6 C) 99.4 F (37.4 C) 98.8 F (37.1 C) 98.3 F (36.8 C)  TempSrc: Oral Oral Oral Oral  SpO2: 100% 100% 99% 100%    Intake/Output Summary (Last 24 hours) at 09/10/2020 0743 Last data filed at 09/10/2020 0230 Gross per 24 hour  Intake 240 ml  Output 300 ml  Net -60 ml   There were no vitals filed for this visit.  Examination:  General exam: Appears calm and comfortable  Respiratory system: Clear to auscultation. Respiratory effort normal. Cardiovascular system: S1 & S2 heard, RRR. No JVD, murmurs, rubs, gallops or clicks. No pedal edema. Gastrointestinal system: Abdomen is nondistended, soft and nontender. No organomegaly or masses felt. Normal bowel sounds heard. Central nervous system: Alert and oriented.  Extremities: Symmetric 5 x 5 power.    Data Reviewed: I have personally reviewed following labs and imaging studies  CBC: Recent Labs  Lab 09/09/20 0922  WBC 9.3  HGB 14.7  HCT 42.5  MCV 92.0  PLT 263   Basic Metabolic Panel: Recent Labs  Lab 09/09/20 0922  NA 137  K 4.4  CL 102  CO2 26  GLUCOSE 98  BUN 13  CREATININE 1.20  CALCIUM 9.7   GFR: CrCl cannot be  calculated (Unknown ideal weight.). Liver Function Tests: Recent Labs  Lab 09/09/20 0922  AST 23  ALT 17  ALKPHOS 52  BILITOT 0.6  PROT 7.1  ALBUMIN 3.9   Recent Labs  Lab 09/09/20 0922  LIPASE 23   No results for input(s): AMMONIA in the last 168 hours. Coagulation Profile: No results for input(s): INR, PROTIME in the last 168 hours. Cardiac Enzymes: No results for input(s): CKTOTAL, CKMB, CKMBINDEX, TROPONINI in the last 168 hours. BNP (last 3 results) No results for input(s): PROBNP in the last 8760 hours. HbA1C: Recent Labs    09/09/20 1630  HGBA1C 9.7*   CBG: Recent Labs  Lab 09/09/20 1727 09/09/20 1801 09/09/20 1936 09/09/20 2207 09/10/20 0603  GLUCAP 116* 110* 96 198* 131*   Lipid Profile: No results for input(s):  CHOL, HDL, LDLCALC, TRIG, CHOLHDL, LDLDIRECT in the last 72 hours. Thyroid Function Tests: No results for input(s): TSH, T4TOTAL, FREET4, T3FREE, THYROIDAB in the last 72 hours. Anemia Panel: No results for input(s): VITAMINB12, FOLATE, FERRITIN, TIBC, IRON, RETICCTPCT in the last 72 hours. Sepsis Labs: No results for input(s): PROCALCITON, LATICACIDVEN in the last 168 hours.  Recent Results (from the past 240 hour(s))  SARS CORONAVIRUS 2 (TAT 6-24 HRS) Nasopharyngeal Nasopharyngeal Swab     Status: None   Collection Time: 09/09/20  2:46 PM   Specimen: Nasopharyngeal Swab  Result Value Ref Range Status   SARS Coronavirus 2 NEGATIVE NEGATIVE Final    Comment: (NOTE) SARS-CoV-2 target nucleic acids are NOT DETECTED.  The SARS-CoV-2 RNA is generally detectable in upper and lower respiratory specimens during the acute phase of infection. Negative results do not preclude SARS-CoV-2 infection, do not rule out co-infections with other pathogens, and should not be used as the sole basis for treatment or other patient management decisions. Negative results must be combined with clinical observations, patient history, and epidemiological information. The expected result is Negative.  Fact Sheet for Patients: HairSlick.no  Fact Sheet for Healthcare Providers: quierodirigir.com  This test is not yet approved or cleared by the [place] FDA and  has been authorized for detection and/or diagnosis of SARS-CoV-2 by FDA under an Emergency Use Authorization (EUA). This EUA will remain  in effect (meaning this test can be used) for the duration of the COVID-19 declaration under Se ction 564(b)(1) of the Act, 21 U.S.C. section 360bbb-3(b)(1), unless the authorization is terminated or revoked sooner.  Performed at [facility], [address] [place], [place] [address]          Radiology Studies: US Abdomen Limited  Result  Date: 09/09/2020 CLINICAL DATA:  Right upper quadrant pain EXAM: ULTRASOUND ABDOMEN LIMITED RIGHT UPPER QUADRANT COMPARISON:  None. FINDINGS: Gallbladder: No gallstones or wall thickening visualized. No sonographic Murphy sign noted by sonographer. Common bile duct: Diameter: Normal caliber, 3 mm Liver: No focal lesion identified. Within normal limits in parenchymal echogenicity. Portal vein is patent on color Doppler imaging with normal direction of blood flow towards the liver. Other: None. IMPRESSION: Normal right upper quadrant ultrasound. Electronically Signed   By: Charlett Nose M.D.   On: 09/09/2020 10:31        Scheduled Meds:  amLODipine  5 mg Oral Daily   Continuous Infusions:  sodium chloride 250 mL/hr at 09/10/20 0655   dextrose 5 % and 0.9% NaCl Stopped (09/09/20 1637)   promethazine (PHENERGAN) injection (IM or IVPB)       LOS: 0 days    Time spent: 35 minutes.  Alba Cory, MD [facility]   If 7PM-7AM, please contact night-coverage [email or web address]  09/10/2020, 7:43 AM

## 2020-09-11 ENCOUNTER — Other Ambulatory Visit: Payer: Self-pay

## 2020-09-11 ENCOUNTER — Encounter (HOSPITAL_COMMUNITY): Payer: Self-pay | Admitting: Internal Medicine

## 2020-09-11 ENCOUNTER — Inpatient Hospital Stay (HOSPITAL_COMMUNITY): Payer: No Typology Code available for payment source

## 2020-09-11 ENCOUNTER — Ambulatory Visit: Payer: Self-pay

## 2020-09-11 DIAGNOSIS — E162 Hypoglycemia, unspecified: Secondary | ICD-10-CM

## 2020-09-11 LAB — BASIC METABOLIC PANEL
Anion gap: 7 (ref 5–15)
BUN: 14 mg/dL (ref 6–20)
CO2: 23 mmol/L (ref 22–32)
Calcium: 9.4 mg/dL (ref 8.9–10.3)
Chloride: 105 mmol/L (ref 98–111)
Creatinine, Ser: 1.09 mg/dL (ref 0.61–1.24)
GFR, Estimated: >60 mL/min (ref >60)
Glucose, Bld: 102 mg/dL — ABNORMAL HIGH (ref 70–99)
Potassium: 4.1 mmol/L (ref 3.5–5.1)
Sodium: 135 mmol/L (ref 135–145)

## 2020-09-11 LAB — GLUCOSE, CAPILLARY: Glucose-Capillary: 98 mg/dL (ref 70–99)

## 2020-09-11 NOTE — Discharge Summary (Signed)
Physician Discharge Summary  [name] LKG:[id number] DOB: [birth year] DOA: 09/09/2020  PCP: Patient, No Pcp Per (Inactive)  Admit date: 09/09/2020 Discharge date: 09/11/2020  Admitted From: Home  Disposition:  Left AMA     Discharge Condition: Left AMA  Brief/Interim Summary: 35 year old with past medical history significant for HIV on HAART, type I diabetics,  with frequent episode of hypoglycemia, who presents with nausea vomiting abdominal pain after he ate at a food truck. He was also feeling very weak, his blood sugar was low at 51.   Admitted with hypoglycemia.   1-Diabetes type 1, with hypoglycemia:  Secondary to insulin and inconsistent diet. He was on D 5, CBG as low as 30. Hypoglycemia resolved.  Lantus decrease to 18 units. Meals coverage 3 units.  Hb A1c at 9. Patient left the hospital. I was not able to see him today.  Continue with lantus 18 units.   2-Nausea, vomiting. Acute on chronic. Initial presentation related to food poisoning. He report history of vomiting intermittently. Plan to get Gastric Empty study. Patient decline test.     3-HTN; Continue with Norvasc.   4-HIV; Continue with HAART.     Discharge Diagnoses:  Active Problems:   Hypoglycemia due to insulin   Hypoglycemia    Discharge Instructions     Follow-up Information     Primary Care at [facility]. Go on 10/30/2020.   Specialty: Family Medicine Why: 1:30 pm  PLEASE MAKE SURE YOU GO TO THIS FOLLOW UP APPOINTMENT TO ESTABLISH A PRIMARY CARE DOCTOR. if for some reason you need to reschedule, please call the office Contact information: [address] [place] [address] [phone]               Allergies  Allergen Reactions   Shellfish Allergy Anaphylaxis and Hives   Aspirin Other (See Comments)    Unknown reaction    Consultations: None   Procedures/Studies: US Abdomen Limited  Result Date: 09/09/2020 CLINICAL DATA:   Right upper quadrant pain EXAM: ULTRASOUND ABDOMEN LIMITED RIGHT UPPER QUADRANT COMPARISON:  None. FINDINGS: Gallbladder: No gallstones or wall thickening visualized. No sonographic Murphy sign noted by sonographer. Common bile duct: Diameter: Normal caliber, 3 mm Liver: No focal lesion identified. Within normal limits in parenchymal echogenicity. Portal vein is patent on color Doppler imaging with normal direction of blood flow towards the liver. Other: None. IMPRESSION: Normal right upper quadrant ultrasound. Electronically Signed   By: Charlett Nose M.D.   On: 09/09/2020 10:31     Subjective: I was not able to see or speak with patient. Patient left the hospital.   Discharge Exam: Vitals:   09/11/20 0016 09/11/20 0459  BP: (!) 138/97 (!) 143/88  Pulse: 69 68  Resp: 18 18  Temp: 97.9 F (36.6 C) 97.9 F (36.6 C)  SpO2: 100% 100%     General: Pt is alert, awake, not in acute distress Cardiovascular: RRR, S1/S2 +, no rubs, no gallops Respiratory: CTA bilaterally, no wheezing, no rhonchi Abdominal: Soft, NT, ND, bowel sounds + Extremities: no edema, no cyanosis    The results of significant diagnostics from this hospitalization (including imaging, microbiology, ancillary and laboratory) are listed below for reference.     Microbiology: Recent Results (from the past 240 hour(s))  SARS CORONAVIRUS 2 (TAT 6-24 HRS) Nasopharyngeal Nasopharyngeal Swab     Status: None   Collection Time: 09/09/20  2:46 PM   Specimen: Nasopharyngeal Swab  Result Value Ref Range Status   SARS  Coronavirus 2 NEGATIVE NEGATIVE Final    Comment: (NOTE) SARS-CoV-2 target nucleic acids are NOT DETECTED.  The SARS-CoV-2 RNA is generally detectable in upper and lower respiratory specimens during the acute phase of infection. Negative results do not preclude SARS-CoV-2 infection, do not rule out co-infections with other pathogens, and should not be used as the sole basis for treatment or other patient  management decisions. Negative results must be combined with clinical observations, patient history, and epidemiological information. The expected result is Negative.  Fact Sheet for Patients: HairSlick.no  Fact Sheet for Healthcare Providers: quierodirigir.com  This test is not yet approved or cleared by the [place] FDA and  has been authorized for detection and/or diagnosis of SARS-CoV-2 by FDA under an Emergency Use Authorization (EUA). This EUA will remain  in effect (meaning this test can be used) for the duration of the COVID-19 declaration under Se ction 564(b)(1) of the Act, 21 U.S.C. section 360bbb-3(b)(1), unless the authorization is terminated or revoked sooner.  Performed at [facility], [address] [place], [place] [address]      Labs: BNP (last 3 results) No results for input(s): BNP in the last 8760 hours. Basic Metabolic Panel: Recent Labs  Lab 09/09/20 0922 09/11/20 0801  NA 137 135  K 4.4 4.1  CL 102 105  CO2 26 23  GLUCOSE 98 102*  BUN 13 14  CREATININE 1.20 1.09  CALCIUM 9.7 9.4   Liver Function Tests: Recent Labs  Lab 09/09/20 0922  AST 23  ALT 17  ALKPHOS 52  BILITOT 0.6  PROT 7.1  ALBUMIN 3.9   Recent Labs  Lab 09/09/20 0922  LIPASE 23   No results for input(s): AMMONIA in the last 168 hours. CBC: Recent Labs  Lab 09/09/20 0922  WBC 9.3  HGB 14.7  HCT 42.5  MCV 92.0  PLT 263   Cardiac Enzymes: No results for input(s): CKTOTAL, CKMB, CKMBINDEX, TROPONINI in the last 168 hours. BNP: Invalid input(s): POCBNP CBG: Recent Labs  Lab 09/10/20 1043 09/10/20 1355 09/10/20 1608 09/10/20 2120 09/11/20 0600  GLUCAP 291* 397* 242* 245* 98   D-Dimer No results for input(s): DDIMER in the last 72 hours. Hgb A1c Recent Labs    09/09/20 1630  HGBA1C 9.7*   Lipid Profile No results for input(s): CHOL, HDL, LDLCALC, TRIG, CHOLHDL, LDLDIRECT in the last  72 hours. Thyroid function studies No results for input(s): TSH, T4TOTAL, T3FREE, THYROIDAB in the last 72 hours.  Invalid input(s): FREET3 Anemia work up No results for input(s): VITAMINB12, FOLATE, FERRITIN, TIBC, IRON, RETICCTPCT in the last 72 hours. Urinalysis    Component Value Date/Time   COLORURINE YELLOW 09/09/2020 0917   APPEARANCEUR CLEAR 09/09/2020 0917   LABSPEC 1.018 09/09/2020 0917   PHURINE 9.0 (H) 09/09/2020 0917   GLUCOSEU 50 (A) 09/09/2020 0917   HGBUR NEGATIVE 09/09/2020 0917   BILIRUBINUR NEGATIVE 09/09/2020 0917   KETONESUR NEGATIVE 09/09/2020 0917   PROTEINUR >=300 (A) 09/09/2020 0917   UROBILINOGEN 0.2 02/10/2015 1416   NITRITE NEGATIVE 09/09/2020 0917   LEUKOCYTESUR NEGATIVE 09/09/2020 0917   Sepsis Labs Invalid input(s): PROCALCITONIN,  WBC,  LACTICIDVEN Microbiology Recent Results (from the past 240 hour(s))  SARS CORONAVIRUS 2 (TAT 6-24 HRS) Nasopharyngeal Nasopharyngeal Swab     Status: None   Collection Time: 09/09/20  2:46 PM   Specimen: Nasopharyngeal Swab  Result Value Ref Range Status   SARS Coronavirus 2 NEGATIVE NEGATIVE Final    Comment: (NOTE) SARS-CoV-2 target nucleic  acids are NOT DETECTED.  The SARS-CoV-2 RNA is generally detectable in upper and lower respiratory specimens during the acute phase of infection. Negative results do not preclude SARS-CoV-2 infection, do not rule out co-infections with other pathogens, and should not be used as the sole basis for treatment or other patient management decisions. Negative results must be combined with clinical observations, patient history, and epidemiological information. The expected result is Negative.  Fact Sheet for Patients: HairSlick.no  Fact Sheet for Healthcare Providers: quierodirigir.com  This test is not yet approved or cleared by the [place] FDA and  has been authorized for detection and/or diagnosis of  SARS-CoV-2 by FDA under an Emergency Use Authorization (EUA). This EUA will remain  in effect (meaning this test can be used) for the duration of the COVID-19 declaration under Se ction 564(b)(1) of the Act, 21 U.S.C. section 360bbb-3(b)(1), unless the authorization is terminated or revoked sooner.  Performed at [facility], [address], [place], [place] [address]      Time coordinating discharge: 40 minutes  SIGNED:   Alba Cory, MD  [facility]

## 2020-09-11 NOTE — Discharge Planning (Signed)
RN went to room to medicated pt post return from Nuclear Med - pt was not in his room and telemetry box was found on bed. Emergency number was called - pts wife answered and stated that pt was discharged and on his way home. Wife was told by RN that pt was not discharged and had left AMA without notifying RN or other personnel on unit. Wife stated that she understood the situation and that she wanted him to be discharged. Pt left with IV intact, as far as RN is aware - it was not found otherwise. RN called pt twice and never got an answer.

## 2020-09-22 ENCOUNTER — Telehealth: Payer: Self-pay

## 2020-09-22 ENCOUNTER — Other Ambulatory Visit: Payer: Self-pay | Admitting: Internal Medicine

## 2020-09-22 NOTE — Telephone Encounter (Signed)
Please advise on refill. Is scheduled to see Family Medicine on 9/9

## 2020-09-22 NOTE — Telephone Encounter (Signed)
Attempted to call patient regarding refill request for Celexa 20 mg tablet. Per Md's last note patient was supposed to follow up with Internal Medicine clinic for future management. Patient has had multiple no shows with their office.  Is scheduled to see Family Medicine on 9/9. Need to ensure patient has enough medication until he can see PCP. Will also need to remind patient that his PCP will need to take over Rx. Left voicemail requesting call back. Will forward message to Dr. Earlene Plater to advise on refill. Juanita Laster, RMA

## 2020-09-23 ENCOUNTER — Other Ambulatory Visit: Payer: Self-pay

## 2020-09-23 ENCOUNTER — Ambulatory Visit: Payer: Self-pay

## 2020-09-24 ENCOUNTER — Encounter (HOSPITAL_COMMUNITY): Payer: Self-pay | Admitting: Emergency Medicine

## 2020-09-24 ENCOUNTER — Other Ambulatory Visit: Payer: Self-pay

## 2020-09-24 ENCOUNTER — Ambulatory Visit: Payer: Self-pay

## 2020-09-24 ENCOUNTER — Ambulatory Visit (HOSPITAL_COMMUNITY)
Admission: EM | Admit: 2020-09-24 | Discharge: 2020-09-25 | Disposition: A | Discharge status: Other Acute Inpatient Hospital | Payer: No Payment, Other | Attending: Family | Admitting: Family

## 2020-09-24 DIAGNOSIS — F322 Major depressive disorder, single episode, severe without psychotic features: Secondary | ICD-10-CM | POA: Insufficient documentation

## 2020-09-24 DIAGNOSIS — F1220 Cannabis dependence, uncomplicated: Secondary | ICD-10-CM | POA: Insufficient documentation

## 2020-09-24 DIAGNOSIS — R45851 Suicidal ideations: Secondary | ICD-10-CM | POA: Insufficient documentation

## 2020-09-24 DIAGNOSIS — F332 Major depressive disorder, recurrent severe without psychotic features: Secondary | ICD-10-CM | POA: Insufficient documentation

## 2020-09-24 DIAGNOSIS — F17290 Nicotine dependence, other tobacco product, uncomplicated: Secondary | ICD-10-CM | POA: Insufficient documentation

## 2020-09-24 DIAGNOSIS — E109 Type 1 diabetes mellitus without complications: Secondary | ICD-10-CM | POA: Insufficient documentation

## 2020-09-24 DIAGNOSIS — Z794 Long term (current) use of insulin: Secondary | ICD-10-CM | POA: Insufficient documentation

## 2020-09-24 DIAGNOSIS — Z21 Asymptomatic human immunodeficiency virus [HIV] infection status: Secondary | ICD-10-CM | POA: Insufficient documentation

## 2020-09-24 DIAGNOSIS — F1290 Cannabis use, unspecified, uncomplicated: Secondary | ICD-10-CM | POA: Insufficient documentation

## 2020-09-24 DIAGNOSIS — F10229 Alcohol dependence with intoxication, unspecified: Secondary | ICD-10-CM | POA: Insufficient documentation

## 2020-09-24 DIAGNOSIS — Z20822 Contact with and (suspected) exposure to covid-19: Secondary | ICD-10-CM | POA: Insufficient documentation

## 2020-09-24 DIAGNOSIS — F419 Anxiety disorder, unspecified: Secondary | ICD-10-CM | POA: Insufficient documentation

## 2020-09-24 DIAGNOSIS — Z9151 Personal history of suicidal behavior: Secondary | ICD-10-CM | POA: Diagnosis not present

## 2020-09-24 LAB — LIPID PANEL
Cholesterol: 187 mg/dL (ref 0–200)
HDL: 100 mg/dL (ref >40)
LDL Cholesterol: 77 mg/dL (ref 0–99)
Total CHOL/HDL Ratio: 1.9 RATIO
Triglycerides: 50 mg/dL (ref <150)
VLDL: 10 mg/dL (ref 0–40)

## 2020-09-24 LAB — GLUCOSE, CAPILLARY
Glucose-Capillary: 323 mg/dL — ABNORMAL HIGH (ref 70–99)
Glucose-Capillary: 377 mg/dL — ABNORMAL HIGH (ref 70–99)
Glucose-Capillary: 254 mg/dL — ABNORMAL HIGH (ref 70–99)

## 2020-09-24 LAB — MAGNESIUM: Magnesium: 1.9 mg/dL (ref 1.7–2.4)

## 2020-09-24 LAB — COMPREHENSIVE METABOLIC PANEL
ALT: 33 U/L (ref 0–44)
AST: 20 U/L (ref 15–41)
Albumin: 4.1 g/dL (ref 3.5–5.0)
Alkaline Phosphatase: 55 U/L (ref 38–126)
Anion gap: 9 (ref 5–15)
BUN: 18 mg/dL (ref 6–20)
CO2: 24 mmol/L (ref 22–32)
Calcium: 10 mg/dL (ref 8.9–10.3)
Chloride: 100 mmol/L (ref 98–111)
Creatinine, Ser: 1.5 mg/dL — ABNORMAL HIGH (ref 0.61–1.24)
GFR, Estimated: >60 mL/min (ref >60)
Glucose, Bld: 324 mg/dL — ABNORMAL HIGH (ref 70–99)
Potassium: 5.3 mmol/L — ABNORMAL HIGH (ref 3.5–5.1)
Sodium: 133 mmol/L — ABNORMAL LOW (ref 135–145)
Total Bilirubin: 1 mg/dL (ref 0.3–1.2)
Total Protein: 7.3 g/dL (ref 6.5–8.1)

## 2020-09-24 LAB — CBC WITH DIFFERENTIAL/PLATELET
Abs Immature Granulocytes: 0.06 10*3/uL (ref 0.00–0.07)
Basophils Absolute: 0 10*3/uL (ref 0.0–0.1)
Basophils Relative: 0 %
Eosinophils Absolute: 0.1 10*3/uL (ref 0.0–0.5)
Eosinophils Relative: 1 %
HCT: 46.9 % (ref 39.0–52.0)
Hemoglobin: 16.1 g/dL (ref 13.0–17.0)
Immature Granulocytes: 1 %
Lymphocytes Relative: 23 %
Lymphs Abs: 1.9 10*3/uL (ref 0.7–4.0)
MCH: 31.7 pg (ref 26.0–34.0)
MCHC: 34.3 g/dL (ref 30.0–36.0)
MCV: 92.3 fL (ref 80.0–100.0)
Monocytes Absolute: 0.6 10*3/uL (ref 0.1–1.0)
Monocytes Relative: 7 %
Neutro Abs: 5.7 10*3/uL (ref 1.7–7.7)
Neutrophils Relative %: 68 %
Platelets: 321 10*3/uL (ref 150–400)
RBC: 5.08 MIL/uL (ref 4.22–5.81)
RDW: 13.4 % (ref 11.5–15.5)
WBC: 8.3 10*3/uL (ref 4.0–10.5)
nRBC: 0 % (ref 0.0–0.2)

## 2020-09-24 LAB — TSH: TSH: 1.101 u[IU]/mL (ref 0.350–4.500)

## 2020-09-24 LAB — ETHANOL: Alcohol, Ethyl (B): <10 mg/dL (ref <10)

## 2020-09-24 LAB — RESP PANEL BY RT-PCR (FLU A&B, COVID) ARPGX2
Influenza A by PCR: NEGATIVE
Influenza B by PCR: NEGATIVE
SARS Coronavirus 2 by RT PCR: NEGATIVE

## 2020-09-24 LAB — POC SARS CORONAVIRUS 2 AG: SARSCOV2ONAVIRUS 2 AG: NEGATIVE

## 2020-09-24 MED ORDER — ALUM & MAG HYDROXIDE-SIMETH 200-200-20 MG/5ML PO SUSP: 30.0000 mL | ORAL | Status: DC | PRN

## 2020-09-24 MED ORDER — ELVITEG-COBIC-EMTRICIT-TENOFAF 150-150-200-10 MG PO TABS
1.0000 | ORAL_TABLET | Freq: Every day | ORAL | Status: DC
  Administered 2020-09-25: 1 via ORAL
  Filled 2020-09-24: qty 1

## 2020-09-24 MED ORDER — CITALOPRAM HYDROBROMIDE 20 MG PO TABS
20.0000 mg | ORAL_TABLET | Freq: Every day | ORAL | Status: DC
  Administered 2020-09-24 – 2020-09-25 (×2): 20 mg via ORAL
  Filled 2020-09-24 (×2): qty 1

## 2020-09-24 MED ORDER — INSULIN GLARGINE-YFGN 100 UNIT/ML ~~LOC~~ SOLN
18.0000 [IU] | Freq: Every day | SUBCUTANEOUS | Status: DC
Start: 2020-09-24 — End: 2020-09-25
  Administered 2020-09-24 – 2020-09-25 (×2): 18 [IU] via SUBCUTANEOUS
  Filled 2020-09-24 (×2): qty 0.18

## 2020-09-24 MED ORDER — MAGNESIUM HYDROXIDE 400 MG/5ML PO SUSP
30.0000 mL | Freq: Every day | ORAL | Status: DC | PRN
  Administered 2020-09-25: 30 mL via ORAL
  Filled 2020-09-24: qty 30

## 2020-09-24 MED ORDER — HYDROXYZINE HCL 25 MG PO TABS
25.0000 mg | ORAL_TABLET | Freq: Three times a day (TID) | ORAL | Status: DC | PRN
  Administered 2020-09-24 – 2020-09-25 (×2): 25 mg via ORAL
  Filled 2020-09-24 (×2): qty 1

## 2020-09-24 MED ORDER — ACETAMINOPHEN 325 MG PO TABS: 650.0000 mg | ORAL_TABLET | Freq: Four times a day (QID) | ORAL | Status: DC | PRN

## 2020-09-24 MED ORDER — TRAZODONE HCL 50 MG PO TABS
50.0000 mg | ORAL_TABLET | Freq: Every evening | ORAL | Status: DC | PRN
  Administered 2020-09-24 – 2020-09-25 (×2): 50 mg via ORAL
  Filled 2020-09-24 (×2): qty 1

## 2020-09-24 MED ORDER — INSULIN ASPART 100 UNIT/ML ~~LOC~~ SOLN
5.0000 [IU] | Freq: Three times a day (TID) | SUBCUTANEOUS | Status: DC
  Administered 2020-09-24 – 2020-09-25 (×2): 5 [IU] via SUBCUTANEOUS
  Filled 2020-09-24: qty 0.05

## 2020-09-24 NOTE — ED Notes (Signed)
Pt A&O x 4, sitting up at bedside at present, no distress noted.  Watching TV at present.  Monitoring for safety.

## 2020-09-24 NOTE — ED Notes (Signed)
Pt sleeping@this time. Breathing even and unlabored. Will continue to monitor for safety 

## 2020-09-24 NOTE — Progress Notes (Signed)
Blood specimens collected from patient, courier services called to pick specimens and take to [facility].

## 2020-09-24 NOTE — BH Assessment (Signed)
Comprehensive Clinical Assessment (CCA) Note  09/24/2020 [name] [id number]  Disposition:  Per Doran Heater, NP, patient will be admitted to [facility] for continuous assessment  The patient demonstrates the following risk factors for suicide: Chronic risk factors for suicide include: psychiatric disorder of depression, substance use disorder, previous suicide attempts several attempts, and medical illness HIV and diabetes . Acute risk factors for suicide include: family or marital conflict, unemployment, and social withdrawal/isolation. Protective factors for this patient include:  none . Considering these factors, the overall suicide risk at this point appears to be moderate. Patient is not appropriate for outpatient follow up.   PHQ2-9    Flowsheet Row ED from 09/24/2020 in [facility] Video Visit from 05/04/2020 in [facility] for Infectious Disease Office Visit from 12/06/2019 in [facility] for Infectious Disease Office Visit from 01/15/2019 in [facility] for Infectious Disease Office Visit from 06/27/2017 in [facility] for Infectious Disease  PHQ-2 Total Score 6 0 2 2 0  PHQ-9 Total Score 25 -- 8 6 --      Flowsheet Row ED from 09/24/2020 in [facility] ED to Hosp-Admission (Discharged) from 09/09/2020 in [facility] GENERAL MED/SURG UNIT ED to Hosp-Admission (Discharged) from 01/12/2020 in [facility]  C-SSRS RISK CATEGORY Error: Question 1 not populated Error: Question 6 not populated Moderate Risk        Chief Complaint:  Chief Complaint  Patient presents with   Urgent Emergent Eval   Depression   Suicidal   Visit Diagnosis: F32.2 MDD Single Episode Severe, F10.20 Alcohol Use Disorder Moderate, F14.20 Cocaine Use Disorder    CCA Screening, Triage and Referral (STR)  Patient Reported Information How did you hear about [place]? Other  (Comment) ([facility] brought patient here)  What Is the Reason for Your Visit/Call Today? Patient was brought to the [facility] by the [facility] because he had presented there and told them that he was depressed and suicidal. Patient states that he feels depressed all the time and he states that he really does not want to be here and has not wanted to live since he was a child. Patient states that he has been bullied and picked on all of his life and treated differently due to his sexuality.  Patient states that he is not a problem person and he has always tried to keep to himself. He states that his family is not supportive of his lifestyle.  Patient states that he has tried to kill himself in the past with insulin, but he is currently not saying that he has a plan.  He states that he would like to peacefully drift away.  Patient states, "I don't want to kill myself, I just want to stop hurting.  Patient denies HI/Psychosis. Patient states that he has been smoking marijuana daily and he has been drinking several days weekly to self-medicate his emotions.Patient states that he not sleeping well, but states that he forces himself to eat due to his diabetes. Patient denies any history of abuse by his family and he denies any history of self-mutilation.  Patient is oriented and alert, his mood is depressed.  Patient's judgment, insight and impulse control are impaired. His thoughts are organized and his memory intact.  He does not appear to be responding to internal stimuli.  How Long Has This Been Causing You Problems? > than 6 months  What  Do You Feel Would Help You the Most Today? Treatment for Depression or other mood problem   Have You Recently Had Any Thoughts About Hurting Yourself? Yes  Are You Planning to Commit Suicide/Harm Yourself At This time? No   Have you Recently Had Thoughts About Hurting Someone Karolee Ohs? No  Are You Planning to Harm Someone at This Time? No  Explanation: No data  recorded  Have You Used Any Alcohol or Drugs in the Past 24 Hours? Yes  How Long Ago Did You Use Drugs or Alcohol? No data recorded What Did You Use and How Much? marijuana and alcohol   Do You Currently Have a Therapist/Psychiatrist? No  Name of Therapist/Psychiatrist: No data recorded  Have You Been Recently Discharged From Any Office Practice or Programs? No  Explanation of Discharge From Practice/Program: No data recorded    CCA Screening Triage Referral Assessment Type of Contact: Face-to-Face  Telemedicine Service Delivery:   Is this Initial or Reassessment? Initial Assessment  Date Telepsych consult ordered in [facility]:  No data recorded Time Telepsych consult ordered in [facility]:  No data recorded Location of Assessment: [facility] Assessment Services  Provider Location: [facility] Assessment Services   Collateral Involvement: none available   Does Patient Have a Automotive engineer Guardian? No data recorded Name and Contact of Legal Guardian: No data recorded If Minor and Not Living with Parent(s), Who has Custody? No data recorded Is CPS involved or ever been involved? Never  Is APS involved or ever been involved? No data recorded  Patient Determined To Be At Risk for Harm To Self or Others Based on Review of Patient Reported Information or Presenting Complaint? Yes, for Self-Harm  Method: No data recorded Availability of Means: No data recorded Intent: No data recorded Notification Required: No data recorded Additional Information for Danger to Others Potential: No data recorded Additional Comments for Danger to Others Potential: No data recorded Are There Guns or Other Weapons in Your Home? No data recorded Types of Guns/Weapons: No data recorded Are These Weapons Safely Secured?                            No data recorded Who Could Verify You Are Able To Have These Secured: No data recorded Do You Have any Outstanding Charges, Pending Court Dates, Parole/Probation?  No data recorded Contacted To Inform of Risk of Harm To Self or Others: Unable to Contact:    Does Patient Present under Involuntary Commitment? No  IVC Papers Initial File Date: No data recorded  [place] of Residence: [place]   Patient Currently Receiving the Following Services: Not Receiving Services   Determination of Need: Emergent (2 hours)   Options For Referral: No data recorded    CCA Biopsychosocial Patient Reported Schizophrenia/Schizoaffective Diagnosis in Past: No   Strengths: Patient states, "I don't treasure anything"   Mental Health Symptoms Depression:   Change in energy/activity; Difficulty Concentrating; Fatigue; Hopelessness; Sleep (too much or little); Tearfulness; Worthlessness   Duration of Depressive symptoms:  Duration of Depressive Symptoms: Greater than two weeks   Mania:   None   Anxiety:    None   Psychosis:   None   Duration of Psychotic symptoms:    Trauma:   None   Obsessions:   None   Compulsions:   None   Inattention:   None   Hyperactivity/Impulsivity:   None   Oppositional/Defiant Behaviors:   None   Emotional Irregularity:  Unstable self-image; Recurrent suicidal behaviors/gestures/threats; Potentially harmful impulsivity   Other Mood/Personality Symptoms:   depressed mood, tearful, flat affect    Mental Status Exam Appearance and self-care  Stature:   Average   Weight:   Average weight   Clothing:   Neat/clean   Grooming:   Normal   Cosmetic use:   None   Posture/gait:   Stooped   Motor activity:   Slowed   Sensorium  Attention:   Normal   Concentration:   Normal   Orientation:   Object; Person; Place; Situation; Time   Recall/memory:   Normal   Affect and Mood  Affect:   Anxious; Blunted; Flat; Depressed; Tearful   Mood:   Depressed; Anxious   Relating  Eye contact:   Avoided   Facial expression:   Depressed; Anxious; Sad   Attitude toward examiner:    Cooperative   Thought and Language  Speech flow:  Clear and Coherent   Thought content:   Appropriate to Mood and Circumstances   Preoccupation:   None   Hallucinations:   None   Organization:  No data recorded  [employer] of Knowledge:   Fair   Intelligence:   Average   Abstraction:   Normal   Judgement:   Impaired   Reality Testing:   Adequate   Insight:   Lacking   Decision Making:   Impulsive   Social Functioning  Social Maturity:   Isolates   Social Judgement:   Victimized   Stress  Stressors:   Family conflict; Financial   Coping Ability:   Exhausted; Overwhelmed   Skill Deficits:   Interpersonal; Self-care   Supports:   Support needed     Religion: Religion/Spirituality Are You A Religious Person?:  (not assessed)  Leisure/Recreation: Leisure / Recreation Do You Have Hobbies?: Yes Leisure and Hobbies: working outside  Exercise/Diet: Exercise/Diet Do You Exercise?: No Have You Gained or Lost A Significant Amount of Weight in the Past Six Months?: No Do You Follow a Special Diet?: No Do You Have Any Trouble Sleeping?: Yes Explanation of Sleeping Difficulties: patient states that he is not sleeping   CCA Employment/Education Employment/Work Situation: Employment / Work Situation Employment Situation: Unemployed Patient's Job has Been Impacted by Current Illness: Yes Describe how Patient's Job has Been Impacted: unable to maintain employment Has Patient ever Been in the [employer]?: No  Education: Education Is Patient Currently Attending School?: No Last Grade Completed: 12 Did You Product manager?: No Did You Have An Individualized Education Program (IIEP): No Did You Have Any Difficulty At School?: No Patient's Education Has Been Impacted by Current Illness: No   CCA Family/Childhood History Family and Relationship History: Family history Marital status: Single Does patient have children?:  No  Childhood History:  Childhood History By whom was/is the patient raised?: Both parents Did patient suffer any verbal/emotional/physical/sexual abuse as a child?: Yes (bullied by people outside his family) Did patient suffer from severe childhood neglect?: No Has patient ever been sexually abused/assaulted/raped as an adolescent or adult?: No Was the patient ever a victim of a crime or a disaster?: No Witnessed domestic violence?: No Has patient been affected by domestic violence as an adult?: No  Child/Adolescent Assessment:     CCA Substance Use Alcohol/Drug Use: Alcohol / Drug Use Pain Medications: see MAR Prescriptions: see MAR Over the Counter: see MAR History of alcohol / drug use?: Yes Longest period of sobriety (when/how long): none reported Negative Consequences of Use: Financial, Personal  relationships Withdrawal Symptoms: None Substance #1 Name of Substance 1: alcohol 1 - Age of First Use: UTA 1 - Amount (size/oz): UTA 1 - Frequency: several days a week 1 - Duration: UTA 1 - Last Use / Amount: UTA 1 - Method of Aquiring: stores 1- Route of Use: oral Substance #2 Name of Substance 2: marijuana 2 - Age of First Use: UTA 2 - Amount (size/oz): UTA 2 - Frequency: daily 2 - Duration: UTA 2 - Last Use / Amount: yesterday 2 - Method of Aquiring: off street 2 - Route of Substance Use: smoke                     ASAM's:  Six Dimensions of Multidimensional Assessment  Dimension 1:  Acute Intoxication and/or Withdrawal Potential:   Dimension 1:  Description of individual's past and current experiences of substance use and withdrawal: Patient states that he has no current withdrawal symptoms  Dimension 2:  Biomedical Conditions and Complications:   Dimension 2:  Description of patient's biomedical conditions and  complications: Patient has HIV and diabetes which the use of alcohol and drugs compromizes his health.  Dimension 3:  Emotional, Behavioral, or  Cognitive Conditions and Complications:  Dimension 3:  Description of emotional, behavioral, or cognitive conditions and complications: Patient is depressed and suicidal  Dimension 4:  Readiness to Change:  Dimension 4:  Description of Readiness to Change criteria: Patient states that he is incapable of change  Dimension 5:  Relapse, Continued use, or Continued Problem Potential:  Dimension 5:  Relapse, continued use, or continued problem potential critiera description: Patient has no coping skills to prevent relapse  Dimension 6:  Recovery/Living Environment:  Dimension 6:  Recovery/Iiving environment criteria description: Patient has minimal emotional support  ASAM Severity Score: ASAM's Severity Rating Score: 15  ASAM Recommended Level of Treatment:     Substance use Disorder (SUD) Substance Use Disorder (SUD)  Checklist Symptoms of Substance Use: Continued use despite having a persistent/recurrent physical/psychological problem caused/exacerbated by use, Continued use despite persistent or recurrent social, interpersonal problems, caused or exacerbated by use, Recurrent use that results in a failure to fulfill major role obligations (work, school, home), Social, occupational, recreational activities given up or reduced due to use  Recommendations for Services/Supports/Treatments: Recommendations for Services/Supports/Treatments Recommendations For Services/Supports/Treatments: SAIOP (Substance Abuse Intensive Outpatient Program)  Discharge Disposition:    DSM5 Diagnoses: Patient Active Problem List   Diagnosis Date Noted   Major depressive disorder, single episode, severe (HCC)    Alcohol intoxication with moderate or severe use disorder (HCC)    Cannabis use disorder, moderate, dependence (HCC)    Hypoglycemia 09/09/2020   Aggressive behavior    Altered mental status 02/09/2020   Suicidal ideation 02/09/2020   MDD (major depressive disorder), recurrent episode, severe (HCC)  01/15/2020   SIRS (systemic inflammatory response syndrome) (HCC)    Healthcare maintenance 12/05/2019   Hypoglycemia due to insulin 03/31/2019   Syphilis 01/15/2019   Closed fracture of base of fifth metatarsal bone of right foot 11/13/2017   Closed displaced comminuted fracture of shaft of right tibia 11/01/2017   Closed fracture of medial malleolus of right ankle 10/31/2017   Vaccine counseling 12/14/2016   Screening examination for venereal disease 06/09/2015   Encounter for long-term (current) use of medications 06/09/2015   Depression 03/04/2015   Dizziness and giddiness 03/04/2015   Tobacco dependence 11/12/2014   HIV infection (HCC) 11/11/2014   Stress 12/19/2012   Asthma 10/02/2010  Diabetes mellitus type 1 (HCC) 03/01/1991     Referrals to Alternative Service(s): Referred to Alternative Service(s):   Place:   Date:   Time:    Referred to Alternative Service(s):   Place:   Date:   Time:    Referred to Alternative Service(s):   Place:   Date:   Time:    Referred to Alternative Service(s):   Place:   Date:   Time:     Esgar Barnick J Tabrina Esty, LCAS

## 2020-09-24 NOTE — Progress Notes (Signed)
Patient states he is light headed and a diabetic, CBG reading of 323. Notified provider via secure chat.
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
## 2020-09-24 NOTE — ED Notes (Signed)
BS 337

## 2020-09-24 NOTE — Progress Notes (Signed)
Patient information has been sent to [facility] via secure chat to review for potential admission. Patient meets inpatient criteria per Doran Heater, NP.   Situation ongoing, CSW will continue to monitor progress.    Signed:  Corky Crafts, MSW, [place], Bridget Hartshorn 09/24/2020 6:49 PM

## 2020-09-24 NOTE — Progress Notes (Signed)
Mental Health Therapist Progress Note   Name: [name]  Total time: 60  Type of Service: Individual Outpatient Mental Health Therapy  OBJECTIVE:  Mood: Depressed and Hopeless and Affect: Depressed and Tearful Risk of harm to self or others: Suicidal ideation Self-harm thoughts Self-harm behaviors  DIAGNOSIS: MDD, Recurrent, Severe   INTERVENTIONS: Interventions utilized:  Supportive Counseling and Link to [employer] met with patient for outpatient mental health individual therapy to include ongoing assessment, support, and reinforcement.  [name] presented tearfully, expressing feelings of hopelessness with suicidal ideation. [name] expressed that over the last few weeks he has become increasingly more despondent, sharing that he feels "alone" and at times invisible to others. He shared that he does not see anything ever changing. He reported that he has become more suicidal, but was not clear on a plan at this time. [name] has had recurrent visits to the ED due to hypoglycemic episodes and severe depression with SI.Marland Kitchen [name] has had treatment for his depression, but does not believe that anything will be helpful. [name] could not contract for safety; this Probation officer accompanied him to [employer] via [facility]. Therapist stayed with [name] until he was taken back to triage at [facility].  Juan Better, LCSW

## 2020-09-25 ENCOUNTER — Inpatient Hospital Stay (HOSPITAL_COMMUNITY)
Admission: RE | Admit: 2020-09-25 | Discharge: 2020-10-08 | DRG: 885 | Disposition: A | Discharge status: Home / Self Care | Payer: Federal, State, Local not specified - Other | Source: Intra-hospital | Attending: Psychiatry | Admitting: Psychiatry

## 2020-09-25 DIAGNOSIS — E10649 Type 1 diabetes mellitus with hypoglycemia without coma: Secondary | ICD-10-CM | POA: Diagnosis present

## 2020-09-25 DIAGNOSIS — Z79899 Other long term (current) drug therapy: Secondary | ICD-10-CM

## 2020-09-25 DIAGNOSIS — F332 Major depressive disorder, recurrent severe without psychotic features: Secondary | ICD-10-CM | POA: Diagnosis not present

## 2020-09-25 DIAGNOSIS — E1065 Type 1 diabetes mellitus with hyperglycemia: Secondary | ICD-10-CM | POA: Diagnosis not present

## 2020-09-25 DIAGNOSIS — F1290 Cannabis use, unspecified, uncomplicated: Secondary | ICD-10-CM | POA: Diagnosis present

## 2020-09-25 DIAGNOSIS — F17290 Nicotine dependence, other tobacco product, uncomplicated: Secondary | ICD-10-CM | POA: Diagnosis present

## 2020-09-25 DIAGNOSIS — Z886 Allergy status to analgesic agent status: Secondary | ICD-10-CM

## 2020-09-25 DIAGNOSIS — J45909 Unspecified asthma, uncomplicated: Secondary | ICD-10-CM | POA: Diagnosis present

## 2020-09-25 DIAGNOSIS — Z794 Long term (current) use of insulin: Secondary | ICD-10-CM | POA: Diagnosis not present

## 2020-09-25 DIAGNOSIS — T7421XA Adult sexual abuse, confirmed, initial encounter: Secondary | ICD-10-CM | POA: Diagnosis present

## 2020-09-25 DIAGNOSIS — F410 Panic disorder [episodic paroxysmal anxiety] without agoraphobia: Secondary | ICD-10-CM | POA: Diagnosis present

## 2020-09-25 DIAGNOSIS — R45851 Suicidal ideations: Secondary | ICD-10-CM | POA: Diagnosis present

## 2020-09-25 DIAGNOSIS — R45850 Homicidal ideations: Secondary | ICD-10-CM | POA: Diagnosis present

## 2020-09-25 DIAGNOSIS — Z21 Asymptomatic human immunodeficiency virus [HIV] infection status: Secondary | ICD-10-CM | POA: Diagnosis present

## 2020-09-25 DIAGNOSIS — Z9114 Patient's other noncompliance with medication regimen: Secondary | ICD-10-CM

## 2020-09-25 DIAGNOSIS — Z789 Other specified health status: Principal | ICD-10-CM

## 2020-09-25 DIAGNOSIS — G43909 Migraine, unspecified, not intractable, without status migrainosus: Secondary | ICD-10-CM | POA: Diagnosis present

## 2020-09-25 DIAGNOSIS — G4700 Insomnia, unspecified: Secondary | ICD-10-CM | POA: Diagnosis present

## 2020-09-25 DIAGNOSIS — B20 Human immunodeficiency virus [HIV] disease: Secondary | ICD-10-CM | POA: Diagnosis present

## 2020-09-25 DIAGNOSIS — Z833 Family history of diabetes mellitus: Secondary | ICD-10-CM | POA: Diagnosis not present

## 2020-09-25 DIAGNOSIS — I10 Essential (primary) hypertension: Secondary | ICD-10-CM

## 2020-09-25 DIAGNOSIS — Z91013 Allergy to seafood: Secondary | ICD-10-CM | POA: Diagnosis not present

## 2020-09-25 DIAGNOSIS — F4310 Post-traumatic stress disorder, unspecified: Secondary | ICD-10-CM | POA: Diagnosis not present

## 2020-09-25 DIAGNOSIS — E1021 Type 1 diabetes mellitus with diabetic nephropathy: Secondary | ICD-10-CM | POA: Diagnosis not present

## 2020-09-25 DIAGNOSIS — Z638 Other specified problems related to primary support group: Secondary | ICD-10-CM

## 2020-09-25 DIAGNOSIS — E162 Hypoglycemia, unspecified: Secondary | ICD-10-CM | POA: Diagnosis present

## 2020-09-25 DIAGNOSIS — E10319 Type 1 diabetes mellitus with unspecified diabetic retinopathy without macular edema: Secondary | ICD-10-CM | POA: Diagnosis present

## 2020-09-25 DIAGNOSIS — E109 Type 1 diabetes mellitus without complications: Secondary | ICD-10-CM | POA: Diagnosis present

## 2020-09-25 LAB — POCT URINE DRUG SCREEN - MANUAL ENTRY (I-SCREEN)
POC Amphetamine UR: NOT DETECTED
POC Buprenorphine (BUP): NOT DETECTED
POC Cocaine UR: NOT DETECTED
POC Marijuana UR: POSITIVE — AB
POC Methadone UR: NOT DETECTED
POC Methamphetamine UR: NOT DETECTED
POC Morphine: NOT DETECTED
POC Oxazepam (BZO): NOT DETECTED
POC Oxycodone UR: NOT DETECTED
POC Secobarbital (BAR): NOT DETECTED

## 2020-09-25 LAB — GLUCOSE, CAPILLARY
Glucose-Capillary: 40 mg/dL — CL (ref 70–99)
Glucose-Capillary: 58 mg/dL — ABNORMAL LOW (ref 70–99)
Glucose-Capillary: 166 mg/dL — ABNORMAL HIGH (ref 70–99)
Glucose-Capillary: 199 mg/dL — ABNORMAL HIGH (ref 70–99)
Glucose-Capillary: 148 mg/dL — ABNORMAL HIGH (ref 70–99)
Glucose-Capillary: 228 mg/dL — ABNORMAL HIGH (ref 70–99)

## 2020-09-25 LAB — PROLACTIN: Prolactin: 6 ng/mL (ref 4.0–15.2)

## 2020-09-25 NOTE — Progress Notes (Signed)
Inpatient Diabetes Program Recommendations  AACE/ADA: New Consensus Statement on Inpatient Glycemic Control (2015)  Target Ranges:  Prepandial:   less than 140 mg/dL      Peak postprandial:   less than 180 mg/dL (1-2 hours)      Critically ill patients:  140 - 180 mg/dL   Lab Results  Component Value Date   GLUCAP 58 (L) 09/25/2020   HGBA1C 9.7 (H) 09/09/2020    Review of Glycemic Control  Diabetes history: DM1 Outpatient Diabetes medications: Lantus 45 units QHS, Novolog 6 units TID Current orders for Inpatient glycemic control: Semglee 18 units QHS, Novolog 5 units TID  HgbA1C 9.7% Hypoglycemia  Inpatient Diabetes Program Recommendations:    Add Novolog 0-9 units TID with meals and 0-5 HS    Per Chart Review, has been told numerous times that he needs to get established with PCP who can assist him with better diabetes management.  Dr. Earlene Plater with Infectious Disease team advised pt on 05/04/2020 to follow up with [facility] thru [facility] and pt was referred there.  Do not see that pt made an appt with the [facility].  Follow.  Thank you. Ailene Ards, RD, LDN, CDE Inpatient Diabetes Coordinator [phone]

## 2020-09-25 NOTE — ED Notes (Signed)
BG 199.  Patient given [place] sandwich and cheezits.  Patient resting on bed and not eating.  Patient requested to be given insulin.  Explained that insulin is to be given with meals.  Patient stated he would eat later.

## 2020-09-25 NOTE — Progress Notes (Signed)
Patient information has been sent to [facility] via secure chat to review for potential admission. Patient meets inpatient criteria per Berneice Heinrich, NP.   Situation ongoing, CSW will continue to monitor progress.    Signed:  Damita Dunnings, MSW, LCSW-A  09/25/2020 9:51 AM

## 2020-09-25 NOTE — ED Notes (Signed)
FBS is 58. Patient given 4 oz of OJ and encouraged to eat his fruit cup.

## 2020-09-25 NOTE — BH Assessment (Addendum)
Disposition:   Patient meets inpatient criteria per Berneice Heinrich, NP.Patient referred to [facility] by am CSW.   @ 2118, followed up with pm [facility] AC Selena Batten, RN), requested patient to be reviewed for bed admission to [facility].

## 2020-09-25 NOTE — ED Provider Notes (Signed)
Involuntary commitment petition completed by this Clinical research associate. Custody order received, RN will facilitate service. Attempted to discuss with patient who refused to awaken when prompted by this writer, appears alert but does not open eyes or verbally respond.

## 2020-09-25 NOTE — ED Notes (Signed)
Pt calm and cooperative. No c/o of pain or distress. Will continue to monitor for safety 

## 2020-09-25 NOTE — ED Notes (Signed)
Pt laying down calm and cooperative. No c/o of pain or distress. Will continue to monitor for safety

## 2020-09-25 NOTE — ED Notes (Signed)
Pt given breakfast.

## 2020-09-25 NOTE — ED Notes (Signed)
Patient resting on bed.  Asked if he'd eaten lunch.  Patient stated yes.  Asked where the food wrappers were and he pointed towards the trash.  Checked the trash and his food wrappers weren't in it.  Upon turning back towards patient, unopened [place] sandwich and cheezits noted to be under bed, against wall.  Approached patient and pointed out that his lunch was on the floor on the other side of his bed.  Patient stated he wasn't hungry, but needed insulin or his blood sugar would continue to rise.  Explained that insulin was to cover his meal and that patient had been hypoglycemic this AM.  Patient sitting on edge of bed with head in hands.  Inetta Fermo, NP notified.

## 2020-09-25 NOTE — ED Notes (Signed)
Pt sleeping at present, no distress noted.  Monitoring for safety. 

## 2020-09-25 NOTE — ED Notes (Signed)
Report called to RN Rudi, [facility] rm [phone].  Pt is IVC, pending [facility] transport after 11pm.

## 2020-09-25 NOTE — ED Notes (Signed)
FBS 148.  Given Zita and chef salad for dinner.

## 2020-09-25 NOTE — ED Notes (Signed)
Patient sitting up at bedside.  Appears tired.  FBS 40 per unit glucometer.  Patient given 4 oz of OJ immediately.  Also given breakfast: cereal, milk, OJ and a fruit cup.  Will notify MD and recheck BG in 15 minutes.

## 2020-09-25 NOTE — ED Notes (Signed)
FBS 166.  Will continue to monitor.

## 2020-09-25 NOTE — ED Provider Notes (Signed)
Behavioral Health Progress Note  Date and Time: 09/25/2020 9:58 AM Name: [name] MRN:  [id number]  Subjective:  [name] continues to report depressed and hopeless mood.  Patient is re-assessed face-to-face by nurse practitioner.  He is seated in observation area, no acute distress. He is alert and oriented, cooperative during assessment.  He reports depressed mood with congruent affect. He states "nothing has changed since yesterday, I have been through this too many times, I do not care anymore."  He endorses active suicidal ideation, does not disclose plan at this time.  He denies homicidal ideations.  He denies history of nonsuicidal self-harm behaviors.He is unable to contract verbally for safety with this Probation officer.  He presents with decreased speech volume and minimal eye contact.  He continues to deny both auditory and visual hallucinations.  Patient is able to converse coherently with linear thoughts and no distractibility or preoccupation.  He denies paranoia.  Objectively there is no evidence of psychosis/mania or delusional thinking.  [name] CBG measured 40 this morning, he was given juice and snacks, CBG has begun to normalize.  He threatens to leave emergency department if transferred.  He reports feeling traumatized by visiting hospital many times since his diagnosis of diabetes type 77 at age 35 years old.  Additionally he reports he has no primary care provider and often no food to eat states that he has "gotten used to it, I know how to manage."  He reports motivation to seek inpatient psychiatric treatment to address mood.  He verbalizes concern that treatment may not be effective states he has "felt this way for too long."  Patient offered support and encouragement.   Diagnosis:  Final diagnoses:  Severe episode of recurrent major depressive disorder, without psychotic features ([place])    Total Time spent with patient: 20 minutes  Past Psychiatric History: Major  depressive disorder Past Medical History:  Past Medical History:  Diagnosis Date  . Anxiety   . Asthma    no prior hospitalizations, intubations  . Depression   . Headache    "only w/stress" (11/01/2017)  . HIV (human immunodeficiency virus infection) ([place])   . Migraine    "a few/month" (11/01/2017)  . Refusal of blood transfusions as patient is Jehovah's Witness   . Seizure ([place])    "only w/my low blood sugars"  (11/01/2017)  . Type I diabetes mellitus ([place])     Past Surgical History:  Procedure Laterality Date  . FRACTURE SURGERY    . IM NAILING TIBIA Right 11/01/2017   INTRAMEDULLARY (IM) NAIL TIBIALRightGeneral  . ORIF ANKLE FRACTURE Right 11/01/2017  . ORIF ANKLE FRACTURE Right 11/01/2017   Procedure: OPEN REDUCTION INTERNAL FIXATION (ORIF) ANKLE FRACTURE;  Surgeon: Shona Needles, MD;  Location: [facility];  Service: Orthopedics;  Laterality: Right;  . TIBIA IM NAIL INSERTION Right 11/01/2017   Procedure: INTRAMEDULLARY (IM) NAIL TIBIAL;  Surgeon: Shona Needles, MD;  Location: [facility];  Service: Orthopedics;  Laterality: Right;   Family History:  Family History  Problem Relation Age of Onset  . Thyroid disease Mother   . Diabetes Mother   . Breast cancer Maternal Grandmother   . Diabetes Maternal Grandmother    Family Psychiatric  History: None reported Social History:  Social History   Substance and Sexual Activity  Alcohol Use Yes  . Alcohol/week: 7.0 standard drinks  . Types: 7 Cans of beer per week     Social History   Substance and Sexual Activity  Drug Use  Not Currently  . Frequency: 2.0 times per week  . Types: Marijuana   Comment: 11/01/2017  "weekly"    Social History   Socioeconomic History  . Marital status: Single    Spouse name: Not on file  . Number of children: Not on file  . Years of education: Not on file  . Highest education level: Not on file  Occupational History  . Not on file  Tobacco Use  . Smoking status: Every Day    Types: Cigars     Start date: 09/29/2015  . Smokeless tobacco: Never  . Tobacco comments:    11/01/2017  "2 cigarettes in ~ 6 month "  Vaping Use  . Vaping Use: Never used  Substance and Sexual Activity  . Alcohol use: Yes    Alcohol/week: 7.0 standard drinks    Types: 7 Cans of beer per week  . Drug use: Not Currently    Frequency: 2.0 times per week    Types: Marijuana    Comment: 11/01/2017  "weekly"  . Sexual activity: Yes    Partners: Male    Comment: pt. declined condoms  Other Topics Concern  . Not on file  Social History Narrative  . Not on file   Social Determinants of Health   Financial Resource Strain: Not on file  Food Insecurity: Not on file  Transportation Needs: Not on file  Physical Activity: Not on file  Stress: Not on file  Social Connections: Not on file   SDOH:  SDOH Screenings   Alcohol Screen: Not on file  Depression (PHQ2-9): Medium Risk  . PHQ-2 Score: 25  Financial Resource Strain: Not on file  Food Insecurity: Not on file  Housing: Not on file  Physical Activity: Not on file  Social Connections: Not on file  Stress: Not on file  Tobacco Use: High Risk  . Smoking Tobacco Use: Every Day  . Smokeless Tobacco Use: Never  Transportation Needs: Not on file   Additional Social History:    Pain Medications: see MAR Prescriptions: see MAR Over the Counter: see MAR History of alcohol / drug use?: Yes Longest period of sobriety (when/how long): none reported Negative Consequences of Use: Financial, Personal relationships Withdrawal Symptoms: None Name of Substance 1: alcohol 1 - Age of First Use: UTA 1 - Amount (size/oz): UTA 1 - Frequency: several days a week 1 - Duration: UTA 1 - Last Use / Amount: UTA 1 - Method of Aquiring: stores 1- Route of Use: oral Name of Substance 2: marijuana 2 - Age of First Use: UTA 2 - Amount (size/oz): UTA 2 - Frequency: daily 2 - Duration: UTA 2 - Last Use / Amount: yesterday 2 - Method of Aquiring: off street 2 - Route  of Substance Use: smoke                Sleep: Fair  Appetite:  Fair  Current Medications:  Current Facility-Administered Medications  Medication Dose Route Frequency Provider Last Rate Last Admin  . acetaminophen (TYLENOL) tablet 650 mg  650 mg Oral Q6H PRN Lucky Rathke, FNP      . alum & mag hydroxide-simeth (MAALOX/MYLANTA) 200-200-20 MG/5ML suspension 30 mL  30 mL Oral Q4H PRN Lucky Rathke, FNP      . citalopram (CELEXA) tablet 20 mg  20 mg Oral Daily Lucky Rathke, FNP   20 mg at 09/25/20 0944  . elvitegravir-cobicistat-emtricitabine-tenofovir (GENVOYA) 150-150-200-10 MG tablet 1 tablet  1 tablet Oral Q breakfast Beatriz Stallion  L, FNP   1 tablet at 09/25/20 0836  . hydrOXYzine (ATARAX/VISTARIL) tablet 25 mg  25 mg Oral TID PRN Rozetta Nunnery, NP   25 mg at 09/24/20 2214  . insulin aspart (novoLOG) injection 5 Units  5 Units Subcutaneous TID WC Lucky Rathke, FNP   5 Units at 09/24/20 1932  . insulin glargine-yfgn (SEMGLEE) injection 18 Units  18 Units Subcutaneous QHS Lucky Rathke, FNP   18 Units at 09/24/20 2155  . magnesium hydroxide (MILK OF MAGNESIA) suspension 30 mL  30 mL Oral Daily PRN Lucky Rathke, FNP      . traZODone (DESYREL) tablet 50 mg  50 mg Oral QHS PRN,MR X 1 Lindon Romp A, NP   50 mg at 09/24/20 2214   Current Outpatient Medications  Medication Sig Dispense Refill  . amLODipine (NORVASC) 5 MG tablet Take 5 mg by mouth daily.    . blood glucose meter kit and supplies Dispense based on patient and insurance preference. Use up to four times daily as directed. (FOR ICD-10 E10.9, E11.9). (Patient taking differently: 1 each by Other route See admin instructions. Dispense based on patient and insurance preference. Use up to four times daily as directed. (FOR ICD-10 E10.9, E11.9).) 1 each 0  . Blood Glucose Monitoring Suppl (TRUE METRIX METER) w/Device KIT USE AS DIRECTED (Patient taking differently: 1 each by Other route as directed.) 1 kit 0  . citalopram (CELEXA) 20  MG tablet TAKE 1 TABLET BY MOUTH DAILY 30 tablet 11  . elvitegravir-cobicistat-emtricitabine-tenofovir (GENVOYA) 150-150-200-10 MG TABS tablet Take 1 tablet by mouth daily with breakfast. 30 tablet 11  . glucose blood test strip USE AS DIRECTED (Patient taking differently: 1 each by Other route as directed.) 150 strip 0  . insulin aspart (NOVOLOG) 100 UNIT/ML injection INJECT 5 UNITS INTO THE SKIN THREE TIMES DAILY WITH MEALS. (Patient taking differently: Inject 4 Units into the skin 3 (three) times daily with meals.) 10 mL 0  . insulin glargine (LANTUS) 100 UNIT/ML injection INJECT 26 UNITS INTO THE SKIN DAILY. (Patient taking differently: Inject 35 Units into the skin daily. Note from 7/13 hospialization for hypoglycemia MD wanted to decrease Lantus to 18 U daily.  Pt reported to me he took 35 U bid. So unclear what does he should be on vs what he takes.) 10 mL 0  . Insulin Syringe-Needle U-100 30G X 1/2" 0.5 ML MISC USE AS DIRECTED WITH INSULIN (Patient taking differently: 1 each by Other route See admin instructions. with insulin) 120 each 0  . TRUEplus Lancets 28G MISC USE AS DIRECTED (Patient taking differently: 1 each by Other route as directed.) 200 each 0    Labs  Lab Results:  Admission on 09/24/2020  Component Date Value Ref Range Status  . SARS Coronavirus 2 by RT PCR 09/24/2020 NEGATIVE  NEGATIVE Final   Comment: (NOTE) SARS-CoV-2 target nucleic acids are NOT DETECTED.  The SARS-CoV-2 RNA is generally detectable in upper respiratory specimens during the acute phase of infection. The lowest concentration of SARS-CoV-2 viral copies this assay can detect is 138 copies/mL. A negative result does not preclude SARS-Cov-2 infection and should not be used as the sole basis for treatment or other patient management decisions. A negative result may occur with  improper specimen collection/handling, submission of specimen other than nasopharyngeal swab, presence of viral mutation(s) within  the areas targeted by this assay, and inadequate number of viral copies(<138 copies/mL). A negative result must be combined with clinical observations,  patient history, and epidemiological information. The expected result is Negative.  Fact Sheet for Patients:  EntrepreneurPulse.com.au  Fact Sheet for Healthcare Providers:  IncredibleEmployment.be  This test is no                          t yet approved or cleared by the [place] FDA and  has been authorized for detection and/or diagnosis of SARS-CoV-2 by FDA under an Emergency Use Authorization (EUA). This EUA will remain  in effect (meaning this test can be used) for the duration of the COVID-19 declaration under Section 564(b)(1) of the Act, 21 U.S.C.section 360bbb-3(b)(1), unless the authorization is terminated  or revoked sooner.      . Influenza A by PCR 09/24/2020 NEGATIVE  NEGATIVE Final  . Influenza B by PCR 09/24/2020 NEGATIVE  NEGATIVE Final   Comment: (NOTE) The Xpert Xpress SARS-CoV-2/FLU/RSV plus assay is intended as an aid in the diagnosis of influenza from Nasopharyngeal swab specimens and should not be used as a sole basis for treatment. Nasal washings and aspirates are unacceptable for Xpert Xpress SARS-CoV-2/FLU/RSV testing.  Fact Sheet for Patients: EntrepreneurPulse.com.au  Fact Sheet for Healthcare Providers: IncredibleEmployment.be  This test is not yet approved or cleared by the [place] FDA and has been authorized for detection and/or diagnosis of SARS-CoV-2 by FDA under an Emergency Use Authorization (EUA). This EUA will remain in effect (meaning this test can be used) for the duration of the COVID-19 declaration under Section 564(b)(1) of the Act, 21 U.S.C. section 360bbb-3(b)(1), unless the authorization is terminated or revoked.  Performed at [facility], [place] [address] [place], [place] [address]   .  WBC 09/24/2020 8.3  4.0 - 10.5 K/uL Final  . RBC 09/24/2020 5.08  4.22 - 5.81 MIL/uL Final  . Hemoglobin 09/24/2020 16.1  13.0 - 17.0 g/dL Final  . HCT 09/24/2020 46.9  39.0 - 52.0 % Final  . MCV 09/24/2020 92.3  80.0 - 100.0 fL Final  . MCH 09/24/2020 31.7  26.0 - 34.0 pg Final  . MCHC 09/24/2020 34.3  30.0 - 36.0 g/dL Final  . RDW 09/24/2020 13.4  11.5 - 15.5 % Final  . Platelets 09/24/2020 321  150 - 400 K/uL Final  . nRBC 09/24/2020 0.0  0.0 - 0.2 % Final  . Neutrophils Relative % 09/24/2020 68  % Final  . Neutro Abs 09/24/2020 5.7  1.7 - 7.7 K/uL Final  . Lymphocytes Relative 09/24/2020 23  % Final  . Lymphs Abs 09/24/2020 1.9  0.7 - 4.0 K/uL Final  . Monocytes Relative 09/24/2020 7  % Final  . Monocytes Absolute 09/24/2020 0.6  0.1 - 1.0 K/uL Final  . Eosinophils Relative 09/24/2020 1  % Final  . Eosinophils Absolute 09/24/2020 0.1  0.0 - 0.5 K/uL Final  . Basophils Relative 09/24/2020 0  % Final  . Basophils Absolute 09/24/2020 0.0  0.0 - 0.1 K/uL Final  . Immature Granulocytes 09/24/2020 1  % Final  . Abs Immature Granulocytes 09/24/2020 0.06  0.00 - 0.07 K/uL Final   Performed at [facility], [place] [address] [place], [place] [address]  . Sodium 09/24/2020 133 (A) 135 - 145 mmol/L Final  . Potassium 09/24/2020 5.3 (A) 3.5 - 5.1 mmol/L Final  . Chloride 09/24/2020 100  98 - 111 mmol/L Final  . CO2 09/24/2020 24  22 - 32 mmol/L Final  . Glucose, Bld 09/24/2020 324 (A) 70 - 99 mg/dL Final  Glucose reference range applies only to samples taken after fasting for at least 8 hours.  . BUN 09/24/2020 18  6 - 20 mg/dL Final  . Creatinine, Ser 09/24/2020 1.50 (A) 0.61 - 1.24 mg/dL Final  . Calcium 09/24/2020 10.0  8.9 - 10.3 mg/dL Final  . Total Protein 09/24/2020 7.3  6.5 - 8.1 g/dL Final  . Albumin 09/24/2020 4.1  3.5 - 5.0 g/dL Final  . AST 09/24/2020 20  15 - 41 U/L Final  . ALT 09/24/2020 33  0 - 44 U/L Final  . Alkaline Phosphatase 09/24/2020 55  38 - 126 U/L Final  .  Total Bilirubin 09/24/2020 1.0  0.3 - 1.2 mg/dL Final  . GFR, Estimated 09/24/2020 >60  >60 mL/min Final   Comment: (NOTE) Calculated using the CKD-EPI Creatinine Equation (2021)   . Anion gap 09/24/2020 9  5 - 15 Final   Performed at [facility] [address] [place], [place] [address]  . Magnesium 09/24/2020 1.9  1.7 - 2.4 mg/dL Final   Performed at [facility] [address], [place], [place] [address]  . Alcohol, Ethyl (B) 09/24/2020 <10  <10 mg/dL Final   Comment: (NOTE) Lowest detectable limit for serum alcohol is 10 mg/dL.  For medical purposes only. Performed at [facility], [place] [address], [place], [place] [address]   . Cholesterol 09/24/2020 187  0 - 200 mg/dL Final  . Triglycerides 09/24/2020 50  <150 mg/dL Final  . HDL 09/24/2020 100  >40 mg/dL Final  . Total CHOL/HDL Ratio 09/24/2020 1.9  RATIO Final  . VLDL 09/24/2020 10  0 - 40 mg/dL Final  . LDL Cholesterol 09/24/2020 77  0 - 99 mg/dL Final   Comment:        Total Cholesterol/HDL:CHD Risk Coronary Heart Disease Risk Table                     Men   Women  1/2 Average Risk   3.4   3.3  Average Risk       5.0   4.4  2 X Average Risk   9.6   7.1  3 X Average Risk  23.4   11.0        Use the calculated Patient Ratio above and the CHD Risk Table to determine the patient's CHD Risk.        ATP III CLASSIFICATION (LDL):  <100     mg/dL   Optimal  100-129  mg/dL   Near or Above                    Optimal  130-159  mg/dL   Borderline  160-189  mg/dL   High  >190     mg/dL   Very High Performed at [facility] [address], [place], [place] [address]   . TSH 09/24/2020 1.101  0.350 - 4.500 uIU/mL Final   Comment: Performed by a 3rd Generation assay with a functional sensitivity of <=0.01 uIU/mL. Performed at [facility], [place] [address], [place],  [address]   . Prolactin 09/24/2020 6.0  4.0 - 15.2 ng/mL Final   Comment: (NOTE) Performed At: [facility] [place], [place] [id number] Rush Farmer MD ML:[id number]   . SARSCOV2ONAVIRUS 2 AG 09/24/2020 NEGATIVE  NEGATIVE Final   Comment: (NOTE) SARS-CoV-2 antigen NOT DETECTED.   Negative results are presumptive.  Negative results do not preclude  SARS-CoV-2 infection and should not be used as the sole basis for treatment or other patient management decisions, including infection  control decisions, particularly in the presence of clinical signs and  symptoms consistent with COVID-19, or in those who have been in contact with the virus.  Negative results must be combined with clinical observations, patient history, and epidemiological information. The expected result is Negative.  Fact Sheet for Patients: https://www.jennings-kim.com/  Fact Sheet for Healthcare Providers: https://alexander-rogers.biz/  This test is not yet approved or cleared by the [place] FDA and  has been authorized for detection and/or diagnosis of SARS-CoV-2 by FDA under an Emergency Use Authorization (EUA).  This EUA will remain in effect (meaning this test can be used) for the duration of  the COV                          ID-19 declaration under Section 564(b)(1) of the Act, 21 U.S.C. section 360bbb-3(b)(1), unless the authorization is terminated or revoked sooner.    . Glucose-Capillary 09/24/2020 323 (A) 70 - 99 mg/dL Final   Glucose reference range applies only to samples taken after fasting for at least 8 hours.  . Comment 1 09/24/2020 NURSE NOTIFIED   Final  . Glucose-Capillary 09/24/2020 377 (A) 70 - 99 mg/dL Final   Glucose reference range applies only to samples taken after fasting for at least 8 hours.  . Glucose-Capillary 09/24/2020 254 (A) 70 - 99 mg/dL Final   Glucose reference range applies only to samples taken after fasting for at least 8 hours.  . Glucose-Capillary 09/25/2020 40 (A) 70 - 99 mg/dL Final   Glucose reference range applies  only to samples taken after fasting for at least 8 hours.  . Comment 1 09/25/2020 NURSE NOTFIED   Final  . Glucose-Capillary 09/25/2020 58 (A) 70 - 99 mg/dL Final   Glucose reference range applies only to samples taken after fasting for at least 8 hours.  . Glucose-Capillary 09/25/2020 166 (A) 70 - 99 mg/dL Final   Glucose reference range applies only to samples taken after fasting for at least 8 hours.  Admission on 09/09/2020, Discharged on 09/11/2020  Component Date Value Ref Range Status  . Lipase 09/09/2020 23  11 - 51 U/L Final   Performed at [facility], [address] [place], [place] [address]  . Sodium 09/09/2020 137  135 - 145 mmol/L Final  . Potassium 09/09/2020 4.4  3.5 - 5.1 mmol/L Final   SLIGHT HEMOLYSIS  . Chloride 09/09/2020 102  98 - 111 mmol/L Final  . CO2 09/09/2020 26  22 - 32 mmol/L Final  . Glucose, Bld 09/09/2020 98  70 - 99 mg/dL Final   Glucose reference range applies only to samples taken after fasting for at least 8 hours.  . BUN 09/09/2020 13  6 - 20 mg/dL Final  . Creatinine, Ser 09/09/2020 1.20  0.61 - 1.24 mg/dL Final  . Calcium 20/36/5055 9.7  8.9 - 10.3 mg/dL Final  . Total Protein 09/09/2020 7.1  6.5 - 8.1 g/dL Final  . Albumin 48/29/5215 3.9  3.5 - 5.0 g/dL Final  . AST 64/14/7299 23  15 - 41 U/L Final  . ALT 09/09/2020 17  0 - 44 U/L Final  . Alkaline Phosphatase 09/09/2020 52  38 - 126 U/L Final  . Total Bilirubin 09/09/2020 0.6  0.3 - 1.2 mg/dL Final  . GFR, Estimated 09/09/2020 >60  >60 mL/min Final  Comment: (NOTE) Calculated using the CKD-EPI Creatinine Equation (2021)   . Anion gap 09/09/2020 9  5 - 15 Final   Performed at [facility] [address] [place], [place] [address]  . WBC 09/09/2020 9.3  4.0 - 10.5 K/uL Final  . RBC 09/09/2020 4.62  4.22 - 5.81 MIL/uL Final  . Hemoglobin 09/09/2020 14.7  13.0 - 17.0 g/dL Final  . HCT 09/09/2020 42.5  39.0 - 52.0 % Final  . MCV 09/09/2020 92.0  80.0 - 100.0 fL Final  . MCH  09/09/2020 31.8  26.0 - 34.0 pg Final  . MCHC 09/09/2020 34.6  30.0 - 36.0 g/dL Final  . RDW 09/09/2020 13.5  11.5 - 15.5 % Final  . Platelets 09/09/2020 263  150 - 400 K/uL Final  . nRBC 09/09/2020 0.0  0.0 - 0.2 % Final   Performed at [facility], [place] [address], [place], [place] [address]  . Color, Urine 09/09/2020 YELLOW  YELLOW Final  . APPearance 09/09/2020 CLEAR  CLEAR Final  . Specific Gravity, Urine 09/09/2020 1.018  1.005 - 1.030 Final  . pH 09/09/2020 9.0 (A) 5.0 - 8.0 Final  . Glucose, UA 09/09/2020 50 (A) NEGATIVE mg/dL Final  . Hgb urine dipstick 09/09/2020 NEGATIVE  NEGATIVE Final  . Bilirubin Urine 09/09/2020 NEGATIVE  NEGATIVE Final  . Ketones, ur 09/09/2020 NEGATIVE  NEGATIVE mg/dL Final  . Protein, ur 09/09/2020 >=300 (A) NEGATIVE mg/dL Final  . Nitrite 09/09/2020 NEGATIVE  NEGATIVE Final  . Chalmers Guest 09/09/2020 NEGATIVE  NEGATIVE Final  . RBC / HPF 09/09/2020 6-10  0 - 5 RBC/hpf Final  . WBC, UA 09/09/2020 0-5  0 - 5 WBC/hpf Final  . Bacteria, UA 09/09/2020 NONE SEEN  NONE SEEN Final  . Mucus 09/09/2020 PRESENT   Final   Performed at [facility], [place] [address] [place], [place] [address]  . Glucose-Capillary 09/09/2020 30 (A) 70 - 99 mg/dL Final   Glucose reference range applies only to samples taken after fasting for at least 8 hours.  . Glucose-Capillary 09/09/2020 216 (A) 70 - 99 mg/dL Final   Glucose reference range applies only to samples taken after fasting for at least 8 hours.  . Glucose-Capillary 09/09/2020 108 (A) 70 - 99 mg/dL Final   Glucose reference range applies only to samples taken after fasting for at least 8 hours.  . Glucose-Capillary 09/09/2020 55 (A) 70 - 99 mg/dL Final   Glucose reference range applies only to samples taken after fasting for at least 8 hours.  Marland Kitchen SARS Coronavirus 2 09/09/2020 NEGATIVE  NEGATIVE Final   Comment: (NOTE) SARS-CoV-2 target nucleic acids are NOT DETECTED.  The SARS-CoV-2 RNA is generally  detectable in upper and lower respiratory specimens during the acute phase of infection. Negative results do not preclude SARS-CoV-2 infection, do not rule out co-infections with other pathogens, and should not be used as the sole basis for treatment or other patient management decisions. Negative results must be combined with clinical observations, patient history, and epidemiological information. The expected result is Negative.  Fact Sheet for Patients: SugarRoll.be  Fact Sheet for Healthcare Providers: https://www.woods-mathews.com/  This test is not yet approved or cleared by the [place] FDA and  has been authorized for detection and/or diagnosis of SARS-CoV-2 by FDA under an Emergency Use Authorization (EUA). This EUA will remain  in effect (meaning this test can be used) for the duration of the COVID-19 declaration under Se  ction 564(b)(1) of the Act, 21 U.S.C. section 360bbb-3(b)(1), unless the authorization is terminated or revoked sooner.  Performed at [facility], [place] [address] [place], [place] [address]   . Hgb A1c MFr Bld 09/09/2020 9.7 (A) 4.8 - 5.6 % Final   Comment: (NOTE) Pre diabetes:          5.7%-6.4%  Diabetes:              >6.4%  Glycemic control for   <7.0% adults with diabetes   . Mean Plasma Glucose 09/09/2020 231.69  mg/dL Final   Performed at [facility] [address], [place], [place] [address]  . Glucose-Capillary 09/09/2020 45 (A) 70 - 99 mg/dL Final   Glucose reference range applies only to samples taken after fasting for at least 8 hours.  . Glucose-Capillary 09/09/2020 51 (A) 70 - 99 mg/dL Final   Glucose reference range applies only to samples taken after fasting for at least 8 hours.  . Glucose-Capillary 09/09/2020 116 (A) 70 - 99 mg/dL Final   Glucose reference range applies only to samples taken after fasting for at least 8 hours.  . Comment 1  09/09/2020 Notify RN   Final  . Comment 2 09/09/2020 Document in Chart   Final  . Glucose-Capillary 09/09/2020 110 (A) 70 - 99 mg/dL Final   Glucose reference range applies only to samples taken after fasting for at least 8 hours.  . Glucose-Capillary 09/09/2020 96  70 - 99 mg/dL Final   Glucose reference range applies only to samples taken after fasting for at least 8 hours.  . Glucose-Capillary 09/09/2020 198 (A) 70 - 99 mg/dL Final   Glucose reference range applies only to samples taken after fasting for at least 8 hours.  . Glucose-Capillary 09/10/2020 131 (A) 70 - 99 mg/dL Final   Glucose reference range applies only to samples taken after fasting for at least 8 hours.  . Glucose-Capillary 09/10/2020 291 (A) 70 - 99 mg/dL Final   Glucose reference range applies only to samples taken after fasting for at least 8 hours.  . Glucose-Capillary 09/10/2020 397 (A) 70 - 99 mg/dL Final   Glucose reference range applies only to samples taken after fasting for at least 8 hours.  . Glucose-Capillary 09/10/2020 242 (A) 70 - 99 mg/dL Final   Glucose reference range applies only to samples taken after fasting for at least 8 hours.  . Glucose-Capillary 09/10/2020 245 (A) 70 - 99 mg/dL Final   Glucose reference range applies only to samples taken after fasting for at least 8 hours.  . Glucose-Capillary 09/11/2020 98  70 - 99 mg/dL Final   Glucose reference range applies only to samples taken after fasting for at least 8 hours.  . Sodium 09/11/2020 135  135 - 145 mmol/L Final  . Potassium 09/11/2020 4.1  3.5 - 5.1 mmol/L Final  . Chloride 09/11/2020 105  98 - 111 mmol/L Final  . CO2 09/11/2020 23  22 - 32 mmol/L Final  . Glucose, Bld 09/11/2020 102 (A) 70 - 99 mg/dL Final   Glucose reference range applies only to samples taken after fasting for at least 8 hours.  . BUN 09/11/2020 14  6 - 20 mg/dL Final  . Creatinine, Ser 09/11/2020 1.09  0.61 - 1.24 mg/dL Final  . Calcium 09/11/2020 9.4  8.9 - 10.3  mg/dL Final  . GFR, Estimated 09/11/2020 >60  >60 mL/min Final   Comment: (NOTE) Calculated using the CKD-EPI Creatinine Equation (  2021)   . Anion gap 09/11/2020 7  5 - 15 Final   Performed at [facility] [address], [place], [place] [address]    Blood Alcohol level:  Lab Results  Component Value Date   ETH <10 09/24/2020   ETH <10 63/87/5643    Metabolic Disorder Labs: Lab Results  Component Value Date   HGBA1C 9.7 (H) 09/09/2020   MPG 231.69 09/09/2020   MPG 205.86 02/11/2020   Lab Results  Component Value Date   PROLACTIN 6.0 09/24/2020   Lab Results  Component Value Date   CHOL 187 09/24/2020   TRIG 50 09/24/2020   HDL 100 09/24/2020   CHOLHDL 1.9 09/24/2020   VLDL 10 09/24/2020   LDLCALC 77 09/24/2020   LDLCALC 64 11/22/2019    Therapeutic Lab Levels: No results found for: LITHIUM No results found for: VALPROATE No components found for:  CBMZ  Physical Findings   PHQ2-9    Flowsheet Row ED from 09/24/2020 in [facility] Video Visit from 05/04/2020 in [facility] Office Visit from 12/06/2019 in [facility] Office Visit from 01/15/2019 in [facility] for [facility] Office Visit from 06/27/2017 in [facility]  PHQ-2 Total Score 6 0 2 2 0  PHQ-9 Total Score 25 -- 8 6 --      Flowsheet Row ED from 09/24/2020 in [facility] ED to Hosp-Admission (Discharged) from 09/09/2020 in [place] [facility] ED to Hosp-Admission (Discharged) from 01/12/2020 in [facility]  C-SSRS RISK CATEGORY Error: Question 1 not populated Error: Question 6 not populated Moderate Risk        Musculoskeletal  Strength & Muscle Tone: within normal limits Gait & Station: normal Patient leans: N/A  Psychiatric Specialty Exam  Presentation   General Appearance: Appropriate for Environment; Casual  Eye Contact:Minimal  Speech:Clear and Coherent; Normal Rate  Speech Volume:Decreased  Handedness:Right   Mood and Affect  Mood:Depressed; Hopeless  Affect:Depressed   Thought Process  Thought Processes:Coherent; Linear  Descriptions of Associations:Intact  Orientation:Full (Time, Place and Person)  Thought Content:Logical; WDL  Diagnosis of Schizophrenia or Schizoaffective disorder in past: No    Hallucinations:Hallucinations: None  Ideas of Reference:None  Suicidal Thoughts:Suicidal Thoughts: Yes, Passive SI Passive Intent and/or Plan: With Intent; With Plan  Homicidal Thoughts:Homicidal Thoughts: No   Sensorium  Memory:Immediate Good; Recent Good; Remote Good  Judgment:Fair  Insight:Fair   Executive Functions  Concentration:Good  Attention Span:Good  Recall:Good  Fund of Knowledge:Good  Language:Good   Psychomotor Activity  Psychomotor Activity:Psychomotor Activity: Normal   Assets  Assets:Communication Skills; Desire for Improvement; Financial Resources/Insurance; Housing; Leisure Time; Resilience   Sleep  Sleep:Sleep: Fair   Nutritional Assessment (For [facility] and [facility] admissions only) Has the patient had a weight loss or gain of 10 pounds or more in the last 3 months?: No Has the patient had a decrease in food intake/or appetite?: Yes Does the patient have dental problems?: No Does the patient have eating habits or behaviors that may be indicators of an eating disorder including binging or inducing vomiting?: No Has the patient recently lost weight without trying?: No Has the patient been eating poorly because of a decreased appetite?: Yes Malnutrition Screening Tool Score: 1    Physical Exam  Physical Exam Vitals and nursing note reviewed.  Constitutional:  Appearance: Normal appearance. He is well-developed and normal weight.  HENT:     Head: Normocephalic and  atraumatic.     Nose: Nose normal.  Cardiovascular:     Rate and Rhythm: Normal rate.  Pulmonary:     Effort: Pulmonary effort is normal.  Musculoskeletal:        General: Normal range of motion.     Cervical back: Normal range of motion.  Skin:    General: Skin is warm and dry.  Neurological:     Mental Status: He is alert and oriented to person, place, and time.  Psychiatric:        Attention and Perception: Attention and perception normal.        Mood and Affect: Mood is depressed. Affect is flat.        Speech: Speech normal.        Behavior: Behavior normal. Behavior is cooperative.        Thought Content: Thought content includes suicidal ideation.        Cognition and Memory: Cognition and memory normal.   Review of Systems  Constitutional: Negative.   HENT: Negative.    Eyes: Negative.   Respiratory: Negative.    Cardiovascular: Negative.   Gastrointestinal: Negative.   Genitourinary: Negative.   Musculoskeletal: Negative.   Skin: Negative.   Neurological: Negative.   Endo/Heme/Allergies: Negative.   Psychiatric/Behavioral:  Positive for depression and suicidal ideas.   Blood pressure 132/82, pulse 89, temperature 98.8 F (37.1 C), temperature source Oral, resp. rate 16, height 6' (1.829 m), weight 203 lb (92.1 kg), SpO2 100 %. Body mass index is 27.53 kg/m.  Treatment Plan Summary: Daily contact with patient to assess and evaluate symptoms and progress in treatment Patient remains voluntary at this time, continue to recommend inpatient psychiatric treatment. Diabetes coordination consult initiated. Patient reviewed with Dr. Serafina Mitchell.  Lucky Rathke, FNP 09/25/2020 9:59 AM

## 2020-09-25 NOTE — ED Notes (Signed)
[facility] requested for transport to [facility].

## 2020-09-25 NOTE — ED Notes (Addendum)
Patient didn't eat fruit cup.  Patient stated he didn't want a bunch of sugar on his stomach

## 2020-09-26 ENCOUNTER — Encounter (HOSPITAL_COMMUNITY): Payer: Self-pay | Admitting: Family

## 2020-09-26 ENCOUNTER — Other Ambulatory Visit: Payer: Self-pay

## 2020-09-26 DIAGNOSIS — F4310 Post-traumatic stress disorder, unspecified: Secondary | ICD-10-CM

## 2020-09-26 DIAGNOSIS — B20 Human immunodeficiency virus [HIV] disease: Secondary | ICD-10-CM

## 2020-09-26 DIAGNOSIS — F332 Major depressive disorder, recurrent severe without psychotic features: Secondary | ICD-10-CM | POA: Diagnosis present

## 2020-09-26 LAB — GLUCOSE, CAPILLARY
Glucose-Capillary: 137 mg/dL — ABNORMAL HIGH (ref 70–99)
Glucose-Capillary: 230 mg/dL — ABNORMAL HIGH (ref 70–99)
Glucose-Capillary: 282 mg/dL — ABNORMAL HIGH (ref 70–99)
Glucose-Capillary: 290 mg/dL — ABNORMAL HIGH (ref 70–99)
Glucose-Capillary: 354 mg/dL — ABNORMAL HIGH (ref 70–99)
Glucose-Capillary: 61 mg/dL — ABNORMAL LOW (ref 70–99)
Glucose-Capillary: 66 mg/dL — ABNORMAL LOW (ref 70–99)

## 2020-09-26 LAB — LIPASE, BLOOD: Lipase: 29 U/L (ref 11–51)

## 2020-09-26 LAB — AMYLASE: Amylase: 111 U/L — ABNORMAL HIGH (ref 28–100)

## 2020-09-26 MED ORDER — ACETAMINOPHEN 325 MG PO TABS
650.0000 mg | ORAL_TABLET | Freq: Four times a day (QID) | ORAL | Status: DC | PRN
  Administered 2020-10-04: 650 mg via ORAL
  Filled 2020-09-26: qty 2

## 2020-09-26 MED ORDER — INSULIN GLARGINE-YFGN 100 UNIT/ML ~~LOC~~ SOLN
18.0000 [IU] | Freq: Every day | SUBCUTANEOUS | Status: DC
  Filled 2020-09-26: qty 0.18

## 2020-09-26 MED ORDER — INSULIN GLARGINE 100 UNIT/ML ~~LOC~~ SOLN
15.0000 [IU] | Freq: Every day | SUBCUTANEOUS | Status: DC
  Administered 2020-09-26 – 2020-10-06 (×9): 15 [IU] via SUBCUTANEOUS
  Filled 2020-09-26: qty 0.15

## 2020-09-26 MED ORDER — AMLODIPINE BESYLATE 5 MG PO TABS
5.0000 mg | ORAL_TABLET | Freq: Every day | ORAL | Status: DC
  Administered 2020-09-26: 5 mg via ORAL
  Filled 2020-09-26 (×3): qty 1

## 2020-09-26 MED ORDER — INSULIN ASPART 100 UNIT/ML IJ SOLN
5.0000 [IU] | Freq: Three times a day (TID) | INTRAMUSCULAR | Status: DC
  Administered 2020-09-26 – 2020-10-03 (×17): 5 [IU] via SUBCUTANEOUS

## 2020-09-26 MED ORDER — INSULIN GLARGINE 100 UNIT/ML ~~LOC~~ SOLN: 18.0000 [IU] | Freq: Every day | SUBCUTANEOUS | Status: DC

## 2020-09-26 MED ORDER — ALUM & MAG HYDROXIDE-SIMETH 200-200-20 MG/5ML PO SUSP: 30.0000 mL | ORAL | Status: DC | PRN

## 2020-09-26 MED ORDER — SERTRALINE HCL 25 MG PO TABS
25.0000 mg | ORAL_TABLET | Freq: Every day | ORAL | Status: DC
  Administered 2020-09-26 – 2020-09-29 (×4): 25 mg via ORAL
  Filled 2020-09-26 (×7): qty 1

## 2020-09-26 MED ORDER — TRAZODONE HCL 50 MG PO TABS
50.0000 mg | ORAL_TABLET | Freq: Every evening | ORAL | Status: DC | PRN
  Administered 2020-09-26 – 2020-10-07 (×10): 50 mg via ORAL
  Filled 2020-09-26 (×2): qty 1
  Filled 2020-09-26: qty 7
  Filled 2020-09-26 (×5): qty 1
  Filled 2020-09-26: qty 7
  Filled 2020-09-26 (×3): qty 1

## 2020-09-26 MED ORDER — MAGNESIUM HYDROXIDE 400 MG/5ML PO SUSP: 30.0000 mL | Freq: Every day | ORAL | Status: DC | PRN

## 2020-09-26 MED ORDER — CITALOPRAM HYDROBROMIDE 20 MG PO TABS
20.0000 mg | ORAL_TABLET | Freq: Every day | ORAL | Status: DC
  Administered 2020-09-26: 20 mg via ORAL
  Filled 2020-09-26 (×3): qty 1

## 2020-09-26 MED ORDER — ELVITEG-COBIC-EMTRICIT-TENOFAF 150-150-200-10 MG PO TABS
1.0000 | ORAL_TABLET | Freq: Every day | ORAL | Status: DC
  Administered 2020-09-26 – 2020-10-08 (×13): 1 via ORAL
  Filled 2020-09-26 (×2): qty 1
  Filled 2020-09-26: qty 2
  Filled 2020-09-26 (×6): qty 1
  Filled 2020-09-26: qty 2
  Filled 2020-09-26 (×8): qty 1

## 2020-09-26 MED ORDER — AMLODIPINE BESYLATE 5 MG PO TABS
5.0000 mg | ORAL_TABLET | ORAL | Status: AC
  Administered 2020-09-26: 5 mg via ORAL
  Filled 2020-09-26: qty 1

## 2020-09-26 MED ORDER — AMLODIPINE BESYLATE 10 MG PO TABS
10.0000 mg | ORAL_TABLET | Freq: Every day | ORAL | Status: DC
  Administered 2020-09-27 – 2020-10-08 (×12): 10 mg via ORAL
  Filled 2020-09-26 (×2): qty 1
  Filled 2020-09-26: qty 7
  Filled 2020-09-26 (×6): qty 1
  Filled 2020-09-26: qty 7
  Filled 2020-09-26 (×7): qty 1

## 2020-09-26 MED ORDER — ONDANSETRON 4 MG PO TBDP
8.0000 mg | ORAL_TABLET | Freq: Three times a day (TID) | ORAL | Status: DC | PRN
  Administered 2020-10-05: 8 mg via ORAL
  Filled 2020-09-26 (×2): qty 2

## 2020-09-26 MED ORDER — INSULIN ASPART 100 UNIT/ML IJ SOLN
0.0000 [IU] | Freq: Three times a day (TID) | INTRAMUSCULAR | Status: DC
  Administered 2020-09-26 (×2): 8 [IU] via SUBCUTANEOUS
  Administered 2020-09-27: 5 [IU] via SUBCUTANEOUS
  Administered 2020-09-27: 11 [IU] via SUBCUTANEOUS

## 2020-09-26 MED ORDER — CLONIDINE HCL 0.1 MG PO TABS
0.1000 mg | ORAL_TABLET | Freq: Three times a day (TID) | ORAL | Status: DC | PRN
  Administered 2020-09-26 – 2020-09-28 (×3): 0.1 mg via ORAL
  Filled 2020-09-26 (×3): qty 1

## 2020-09-26 MED ORDER — INSULIN ASPART 100 UNIT/ML IJ SOLN
0.0000 [IU] | Freq: Every day | INTRAMUSCULAR | Status: DC
  Administered 2020-09-26: 5 [IU] via SUBCUTANEOUS
  Administered 2020-09-28: 4 [IU] via SUBCUTANEOUS
  Administered 2020-10-04: 5 [IU] via SUBCUTANEOUS
  Filled 2020-09-26: qty 0.05
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
## 2020-09-26 NOTE — BH Assessment (Addendum)
Disposition:   Patient accepted to [facility]. The accepting provider is Doran Heater, NP. The attending provider is Dr. Jola Babinski. Patient assigned to room  # 403-1. Patient may transfer to [facility] after 2300.   The [facility] has notified patient's nurse Hansel Starling, RN).   Requested [facility] nursing to have patient sign voluntary form prior to transfer but if IVC'd please send a copy of paperwork. Fax # [phone].

## 2020-09-26 NOTE — Progress Notes (Signed)
   09/26/20 0800  Psych Admission Type (Psych Patients Only)  Admission Status Voluntary  Psychosocial Assessment  Patient Complaints None;Other (Comment) (patient would not respond to me, he did not engage in any verbal communication)  Eye Contact Avoids  Facial Expression Flat  Affect Depressed;Angry  Speech Slow  Interaction Avoidant  Motor Activity Slow  Appearance/Hygiene Unremarkable  Behavior Characteristics Agitated;Guarded  Mood Angry;Depressed  Thought Process  Coherency Unable to assess (nonverbal at the moment)  Content UTA  Delusions UTA  Perception UTA  Hallucination UTA  Judgment Impaired  Confusion UTA  Danger to Self  Current suicidal ideation? Denies (refused to answer)  Self-Injurious Behavior No self-injurious ideation or behavior indicators observed or expressed   Agreement Not to Harm Self Yes (patient would not comment)  Danger to Others  Danger to Others None reported or observed    As the day progressed and we interacted more today,  [name] did talk to me and he forwarded a little a times.     Page NOVEL CORONAVIRUS (COVID-19) DAILY CHECK-OFF SYMPTOMS - answer yes or no to each - every day NO YES  Have you had a fever in the past 24 hours?  Fever (Temp > 37.80C / 100F) X    Have you had any of these symptoms in the past 24 hours? New Cough  Sore Throat   Shortness of Breath  Difficulty Breathing  Unexplained Body Aches   X    Have you had any one of these symptoms in the past 24 hours not related to allergies?   Runny Nose  Nasal Congestion  Sneezing   X    If you have had runny nose, nasal congestion, sneezing in the past 24 hours, has it worsened?   X    EXPOSURES - check yes or no X    Have you traveled outside the state in the past 14 days?   X    Have you been in contact with someone with a confirmed diagnosis of COVID-19 or PUI in the past 14 days without wearing appropriate PPE?   X    Have you been living in the  same home as a person with confirmed diagnosis of COVID-19 or a PUI (household contact)?     X    Have you been diagnosed with COVID-19?     X                                                                                                                             What to do next: Answered NO to all: Answered YES to anything:    Proceed with unit schedule Follow the [facility] Inpatient Flowsheet.

## 2020-09-26 NOTE — Progress Notes (Signed)
Admission note:  35 yr old male was involuntarily admitted to [facility]. Pt appears flat and depressed. Pt was alert and oriented during assessment. He denies SI/HI/AVH and pain during assessment. Pt signed consents, but refused to verbally cooperate with most of the assessment. Pt is upset, appeared frustrated and potentially aggressive per this writer's assessment.VS were taken repeatedly as BP and pulse was elevated. Final BP was 165/106 and pulse 101. Pt has pmh of DM and FSBS on admission was 235. Pt was informed of the IVC process, acute hospitalization stabilization and oriented to the unit. Skin assessment was completed and no significant findings were evident nor was contraband present. Pt belongings were secured in locker 41 by security as witnessed by pt. Pt denies further questions at this time. Will continue to monitor and assess. Safety maintained

## 2020-09-26 NOTE — [facility] Group Notes (Signed)
Pt did not attend goals group. 
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
## 2020-09-26 NOTE — Progress Notes (Signed)
Psychoeducational Group Note  Date:  09/26/2020 Time:  2243  Group Topic/Focus:  Wrap-Up Group:   The focus of this group is to help patients review their daily goal of treatment and discuss progress on daily workbooks.  Participation Level: Did Not Attend  Participation Quality:  Not Applicable  Affect:  Not Applicable  Cognitive:  Not Applicable  Insight:  Not Applicable  Engagement in Group: Not Applicable  Additional Comments:  The patient did not attend group this evening.   Hazle Coca S 09/26/2020, 10:43 PM

## 2020-09-26 NOTE — [facility] Group Notes (Signed)
LCSW Group Therapy Note  09/26/2020   10:00-11:00am   Type of Therapy and Topic:  Group Therapy: Anger Cues and Responses  Participation Level:  Did Not Attend   Description of Group:   In this group, patients learned how to recognize the physical, cognitive, emotional, and behavioral responses they have to anger-provoking situations.  They identified a recent time they became angry and how they reacted.  They analyzed how their reaction was possibly beneficial and how it was possibly unhelpful.  The group discussed a variety of healthier coping skills that could help with such a situation in the future.  Focus was placed on how helpful it is to recognize the underlying emotions to our anger, because working on those can lead to a more permanent solution as well as our ability to focus on the important rather than the urgent.  Therapeutic Goals: Patients will remember their last incident of anger and how they felt emotionally and physically, what their thoughts were at the time, and how they behaved. Patients will identify how their behavior at that time worked for them, as well as how it worked against them. Patients will explore possible new behaviors to use in future anger situations. Patients will learn that anger itself is normal and cannot be eliminated, and that healthier reactions can assist with resolving conflict rather than worsening situations.  Summary of Patient Progress:  The patient was invited to group, did not attend.  Therapeutic Modalities:   Cognitive Behavioral Therapy  Lin Hackmann J Grossman-Orr   

## 2020-09-27 LAB — GLUCOSE, CAPILLARY
Glucose-Capillary: 51 mg/dL — ABNORMAL LOW (ref 70–99)
Glucose-Capillary: 342 mg/dL — ABNORMAL HIGH (ref 70–99)
Glucose-Capillary: 228 mg/dL — ABNORMAL HIGH (ref 70–99)
Glucose-Capillary: 167 mg/dL — ABNORMAL HIGH (ref 70–99)

## 2020-09-27 MED ORDER — INSULIN ASPART 100 UNIT/ML IJ SOLN
0.0000 [IU] | Freq: Three times a day (TID) | INTRAMUSCULAR | Status: DC
  Administered 2020-09-27: 1 [IU] via SUBCUTANEOUS
  Administered 2020-09-28: 4 [IU] via SUBCUTANEOUS
  Administered 2020-09-29: 5 [IU] via SUBCUTANEOUS
  Administered 2020-09-29: 0 [IU] via SUBCUTANEOUS
  Administered 2020-09-30: 4 [IU] via SUBCUTANEOUS
  Administered 2020-09-30: 3 [IU] via SUBCUTANEOUS
  Administered 2020-09-30: 1 [IU] via SUBCUTANEOUS
  Administered 2020-10-01: 4 [IU] via SUBCUTANEOUS
  Administered 2020-10-02: 2 [IU] via SUBCUTANEOUS
  Administered 2020-10-03: 6 [IU] via SUBCUTANEOUS
  Administered 2020-10-03: 1 [IU] via SUBCUTANEOUS

## 2020-09-27 MED ORDER — INSULIN ASPART 100 UNIT/ML IJ SOLN: 0.0000 [IU] | Freq: Every day | INTRAMUSCULAR | Status: DC

## 2020-09-27 NOTE — Progress Notes (Signed)
[facility] MD Progress Note  09/27/2020 1:59 PM [name]  MRN:  [id number] Subjective: Patient is a 35 year old male with a past psychiatric history significant for depression and probable posttraumatic stress disorder as well as a past medical history significant for poorly controlled diabetes mellitus, HIV, hypertension and chronic suicidal ideation who was admitted for worsening depression and suicidal ideation.  Objective: Patient is seen and examined.  Patient is a 35 year old male with the above-stated past psychiatric history who is seen in follow-up.  He is essentially unchanged from yesterday.  He is very negativistic.  He sees the world is a bad place, its not going to change, and it is not going to help him.  We attempted to reframe some of these chronic problems with him, but he is unable to see any other viewpoint.  He does not have acute suicidal plans, but just really believes that he would be better off dead.  His blood sugars been up and down so far, and the diabetic teaching nurse recommended switching him to the sensitive scale which I have done this morning.  He became very upset this morning when the staff wanted him to stay in his room and lay down given the fluctuations in his blood sugar.  He is angry over the fact that they do not realize that he knows what he is going through, and that he is able to control it, and that he could have easily gotten to the dining hall without difficulty.  He denied any auditory or visual hallucinations.  He continues to harbor chronic death thoughts.  He denied any problems with his current medications.  His amylase and lipase revealed an elevated amylase at 111 but lipase was normal at 29.  His most recent for blood sugars from 7/30 was 354, 7/31 was 51, 2 hours later on 7/31 was 342, and his blood sugar at 1146 this a.m. was 228.  Principal Problem: <principal problem not specified> Diagnosis: Active Problems:   MDD (major depressive  disorder), recurrent severe, without psychosis (HCC)  Total Time spent with patient: 20 minutes  Past Psychiatric History: See admission H&P  Past Medical History:  Past Medical History:  Diagnosis Date   Anxiety    Asthma    no prior hospitalizations, intubations   Depression    Headache    "only w/stress" (11/01/2017)   HIV (human immunodeficiency virus infection) (HCC)    Migraine    "a few/month" (11/01/2017)   Refusal of blood transfusions as patient is Jehovah's Witness    Seizure (HCC)    "only w/my low blood sugars"  (11/01/2017)   Type I diabetes mellitus (HCC)     Past Surgical History:  Procedure Laterality Date   FRACTURE SURGERY     IM NAILING TIBIA Right 11/01/2017   INTRAMEDULLARY (IM) NAIL TIBIALRightGeneral   ORIF ANKLE FRACTURE Right 11/01/2017   ORIF ANKLE FRACTURE Right 11/01/2017   Procedure: OPEN REDUCTION INTERNAL FIXATION (ORIF) ANKLE FRACTURE;  Surgeon: Roby Lofts, MD;  Location: [facility] OR;  Service: Orthopedics;  Laterality: Right;   TIBIA IM NAIL INSERTION Right 11/01/2017   Procedure: INTRAMEDULLARY (IM) NAIL TIBIAL;  Surgeon: Roby Lofts, MD;  Location: [facility] OR;  Service: Orthopedics;  Laterality: Right;   Family History:  Family History  Problem Relation Age of Onset   Thyroid disease Mother    Diabetes Mother    Breast cancer Maternal Grandmother    Diabetes Maternal Grandmother    Family Psychiatric  History: See  admission H&P Social History:  Social History   Substance and Sexual Activity  Alcohol Use Yes   Alcohol/week: 7.0 standard drinks   Types: 7 Cans of beer per week     Social History   Substance and Sexual Activity  Drug Use Not Currently   Frequency: 2.0 times per week   Types: Marijuana   Comment: 11/01/2017  "weekly"    Social History   Socioeconomic History   Marital status: Single    Spouse name: Not on file   Number of children: Not on file   Years of education: Not on file   Highest education level: Not on file   Occupational History   Not on file  Tobacco Use   Smoking status: Every Day    Types: Cigars    Start date: 09/29/2015   Smokeless tobacco: Never   Tobacco comments:    11/01/2017  "2 cigarettes in ~ 6 month "  Vaping Use   Vaping Use: Never used  Substance and Sexual Activity   Alcohol use: Yes    Alcohol/week: 7.0 standard drinks    Types: 7 Cans of beer per week   Drug use: Not Currently    Frequency: 2.0 times per week    Types: Marijuana    Comment: 11/01/2017  "weekly"   Sexual activity: Yes    Partners: Male    Comment: pt. declined condoms  Other Topics Concern   Not on file  Social History Narrative   Not on file   Social Determinants of Health   Financial Resource Strain: Not on file  Food Insecurity: Not on file  Transportation Needs: Not on file  Physical Activity: Not on file  Stress: Not on file  Social Connections: Not on file   Additional Social History:                         Sleep: Good  Appetite:  Fair  Current Medications: Current Facility-Administered Medications  Medication Dose Route Frequency Provider Last Rate Last Admin   acetaminophen (TYLENOL) tablet 650 mg  650 mg Oral Q6H PRN Bobbitt, Shalon E, NP       alum & mag hydroxide-simeth (MAALOX/MYLANTA) 200-200-20 MG/5ML suspension 30 mL  30 mL Oral Q4H PRN Bobbitt, Shalon E, NP       amLODipine (NORVASC) tablet 10 mg  10 mg Oral Daily Antonieta Pert, MD   10 mg at 09/27/20 0959   cloNIDine (CATAPRES) tablet 0.1 mg  0.1 mg Oral TID PRN Antonieta Pert, MD   0.1 mg at 09/26/20 2249   elvitegravir-cobicistat-emtricitabine-tenofovir (GENVOYA) 150-150-200-10 MG tablet 1 tablet  1 tablet Oral Q breakfast Bobbitt, Shalon E, NP   1 tablet at 09/27/20 0806   insulin aspart (novoLOG) injection 0-5 Units  0-5 Units Subcutaneous QHS Bobbitt, Shalon E, NP   5 Units at 09/26/20 2145   insulin aspart (novoLOG) injection 0-6 Units  0-6 Units Subcutaneous TID WC Antonieta Pert, MD        insulin aspart (novoLOG) injection 5 Units  5 Units Subcutaneous TID WC Antonieta Pert, MD   5 Units at 09/26/20 1829   insulin glargine (LANTUS) injection 15 Units  15 Units Subcutaneous QHS Antonieta Pert, MD   15 Units at 09/26/20 2142   magnesium hydroxide (MILK OF MAGNESIA) suspension 30 mL  30 mL Oral Daily PRN Bobbitt, Shalon E, NP       ondansetron (ZOFRAN-ODT) disintegrating tablet 8  mg  8 mg Oral Q8H PRN Antonieta Pertlary, Oliverio Cho Lawson, MD       sertraline (ZOLOFT) tablet 25 mg  25 mg Oral Daily Antonieta Pertlary, Doyle Tegethoff Lawson, MD   25 mg at 09/27/20 [id number]   traZODone (DESYREL) tablet 50 mg  50 mg Oral QHS PRN Bobbitt, Shalon E, NP   50 mg at 09/26/20 2149    Lab Results:  Results for orders placed or performed during the hospital encounter of 09/25/20 (from the past 48 hour(s))  Glucose, capillary     Status: Abnormal   Collection Time: 09/26/20 12:30 AM  Result Value Ref Range   Glucose-Capillary 230 (H) 70 - 99 mg/dL    Comment: Glucose reference range applies only to samples taken after fasting for at least 8 hours.  Glucose, capillary     Status: Abnormal   Collection Time: 09/26/20  8:25 AM  Result Value Ref Range   Glucose-Capillary 290 (H) 70 - 99 mg/dL    Comment: Glucose reference range applies only to samples taken after fasting for at least 8 hours.  Glucose, capillary     Status: Abnormal   Collection Time: 09/26/20 11:25 AM  Result Value Ref Range   Glucose-Capillary 61 (L) 70 - 99 mg/dL    Comment: Glucose reference range applies only to samples taken after fasting for at least 8 hours.  Glucose, capillary     Status: Abnormal   Collection Time: 09/26/20 11:53 AM  Result Value Ref Range   Glucose-Capillary 66 (L) 70 - 99 mg/dL    Comment: Glucose reference range applies only to samples taken after fasting for at least 8 hours.  Glucose, capillary     Status: Abnormal   Collection Time: 09/26/20 12:44 PM  Result Value Ref Range   Glucose-Capillary 137 (H) 70 - 99 mg/dL     Comment: Glucose reference range applies only to samples taken after fasting for at least 8 hours.  Glucose, capillary     Status: Abnormal   Collection Time: 09/26/20  4:59 PM  Result Value Ref Range   Glucose-Capillary 282 (H) 70 - 99 mg/dL    Comment: Glucose reference range applies only to samples taken after fasting for at least 8 hours.  Lipase, blood     Status: None   Collection Time: 09/26/20  6:29 PM  Result Value Ref Range   Lipase 29 11 - 51 U/L    Comment: Performed at [facility], [address] [place], [place] [address]  Amylase     Status: Abnormal   Collection Time: 09/26/20  6:29 PM  Result Value Ref Range   Amylase 111 (H) 28 - 100 U/L    Comment: Performed at [facility], [address] [place], [place] [address]  Glucose, capillary     Status: Abnormal   Collection Time: 09/26/20  7:28 PM  Result Value Ref Range   Glucose-Capillary 354 (H) 70 - 99 mg/dL    Comment: Glucose reference range applies only to samples taken after fasting for at least 8 hours.  Glucose, capillary     Status: Abnormal   Collection Time: 09/27/20  6:29 AM  Result Value Ref Range   Glucose-Capillary 51 (L) 70 - 99 mg/dL    Comment: Glucose reference range applies only to samples taken after fasting for at least 8 hours.  Glucose, capillary     Status: Abnormal   Collection Time: 09/27/20  8:20 AM  Result Value Ref Range  Glucose-Capillary 342 (H) 70 - 99 mg/dL    Comment: Glucose reference range applies only to samples taken after fasting for at least 8 hours.  Glucose, capillary     Status: Abnormal   Collection Time: 09/27/20 11:46 AM  Result Value Ref Range   Glucose-Capillary 228 (H) 70 - 99 mg/dL    Comment: Glucose reference range applies only to samples taken after fasting for at least 8 hours.    Blood Alcohol level:  Lab Results  Component Value Date   ETH <10 09/24/2020   ETH <10 02/09/2020    Metabolic Disorder Labs: Lab  Results  Component Value Date   HGBA1C 9.7 (H) 09/09/2020   MPG 231.69 09/09/2020   MPG 205.86 02/11/2020   Lab Results  Component Value Date   PROLACTIN 6.0 09/24/2020   Lab Results  Component Value Date   CHOL 187 09/24/2020   TRIG 50 09/24/2020   HDL 100 09/24/2020   CHOLHDL 1.9 09/24/2020   VLDL 10 09/24/2020   LDLCALC 77 09/24/2020   LDLCALC 64 11/22/2019    Physical Findings: AIMS: Facial and Oral Movements Muscles of Facial Expression: None, normal Lips and Perioral Area: None, normal Jaw: None, normal Tongue: None, normal,Extremity Movements Upper (arms, wrists, hands, fingers): None, normal Lower (legs, knees, ankles, toes): None, normal, Trunk Movements Neck, shoulders, hips: None, normal, Overall Severity Severity of abnormal movements (highest score from questions above): None, normal Incapacitation due to abnormal movements: None, normal Patient's awareness of abnormal movements (rate only patient's report): No Awareness, Dental Status Current problems with teeth and/or dentures?: Yes Does patient usually wear dentures?: Yes  CIWA:    COWS:  COWS Total Score: 4  Musculoskeletal: Strength & Muscle Tone: within normal limits Gait & Station: normal Patient leans: N/A  Psychiatric Specialty Exam:  Presentation  General Appearance: Fairly Groomed  Eye Contact:Minimal  Speech:Normal Rate  Speech Volume:Decreased  Handedness:Right   Mood and Affect  Mood:Depressed; Dysphoric; Irritable  Affect:Congruent   Thought Process  Thought Processes:Goal Directed  Descriptions of Associations:Circumstantial  Orientation:Full (Time, Place and Person)  Thought Content:Rumination  History of Schizophrenia/Schizoaffective disorder:No  Duration of Psychotic Symptoms:No data recorded Hallucinations:Hallucinations: None  Ideas of Reference:None  Suicidal Thoughts:Suicidal Thoughts: Yes, Passive SI Passive Intent and/or Plan: Without  Intent  Homicidal Thoughts:Homicidal Thoughts: No   Sensorium  Memory:Immediate Fair; Recent Fair; Remote Fair  Judgment:Impaired  Insight:Fair   Executive Functions  Concentration:Fair  Attention Span:Fair  Recall:Fair  Fund of Knowledge:Fair  Language:Good   Psychomotor Activity  Psychomotor Activity:Psychomotor Activity: Decreased   Assets  Assets:Desire for Improvement; Resilience   Sleep  Sleep:Sleep: Fair    Physical Exam: Physical Exam Vitals and nursing note reviewed.  Constitutional:      Appearance: Normal appearance.  HENT:     Head: Normocephalic and atraumatic.  Pulmonary:     Effort: Pulmonary effort is normal.  Neurological:     General: No focal deficit present.     Mental Status: He is alert and oriented to person, place, and time.   Review of Systems  All other systems reviewed and are negative. Blood pressure (!) 148/104, pulse 96, temperature (!) 97.4 F (36.3 C), temperature source Oral, resp. rate 16, height  (1.854 m), weight 93 kg, SpO2 100 %. Body mass index is 27.05 kg/m.   Treatment Plan Summary: Daily contact with patient to assess and evaluate symptoms and progress in treatment, Medication management, and Plan patient is seen and examined.  Patient is a  35 year old male with the above-stated past psychiatric history who is seen in follow-up.  Diagnosis: 1.  Major depression, recurrent, severe without psychotic features 2.  Posttraumatic stress disorder 3.  Components of personality disorders 4.  Poorly controlled diabetes mellitus 5.  HIV 6.  Hypertension 7.  Proteinuria  Pertinent findings on evaluation today: 1.  Essentially unchanged from yesterday. 2.  Remains with chronic death thoughts but no suicidal plan at this point. 3.  Continues to have labile glucose levels  Plan: 1.  Continue Norvasc 10 mg p.o. daily for hypertension. 2.  Continue clonidine 0.1 mg p.o. 3 times daily as needed a systolic blood  pressure greater than 150 (the patient has refused this so far). 3.  Consult to diabetic coordinator and registered dietitian (I suspect he will refuse this when offered) 4.  Continue Genvoya 1 tablet p.o. daily with breakfast for HIV 5.  Change sliding scale insulin to very sensitive. 6.  Continue NovoLog 5 units subcu 3 times daily AC for diabetes mellitus 7.  Continue Lantus insulin 15 units subcu nightly for diabetes 8.  Continue Zofran 8 mg p.o. every 8 hours as needed nausea and vomiting 9.  Continue Zoloft 25 mg p.o. daily for depression and anxiety. 10.  Continue trazodone 50 mg p.o. nightly as needed insomnia. 11.  Disposition planning-in progress.  Antonieta Pert, MD 09/27/2020, 1:59 PM

## 2020-09-27 NOTE — Progress Notes (Signed)
Inpatient Diabetes Program Recommendations  AACE/ADA: New Consensus Statement on Inpatient Glycemic Control (2015)  Target Ranges:  Prepandial:   less than 140 mg/dL      Peak postprandial:   less than 180 mg/dL (1-2 hours)      Critically ill patients:  140 - 180 mg/dL   Lab Results  Component Value Date   GLUCAP 342 (H) 09/27/2020   HGBA1C 9.7 (H) 09/09/2020    Review of Glycemic Control Results for CONSTANTINO, STARACE (MRN [id number]) as of 09/27/2020 08:24  Ref. Range 09/26/2020 08:25 09/26/2020 11:25 09/26/2020 11:53 09/26/2020 12:44 09/26/2020 16:59 09/26/2020 19:28 09/27/2020 06:29 09/27/2020 08:20  Glucose-Capillary Latest Ref Range: 70 - 99 mg/dL 621 (H) 61 (L) 66 (L) 308 (H) 282 (H) 354 (H) 51 (L) 342 (H)   Diabetes history: DM Outpatient Diabetes medications:  Lantus 26 units daily (however patient appears to be taking more) Novolog 4 units tid with meals Current orders for Inpatient glycemic control:  Novolog moderate tid with meals and HS Novolog 5 units tid with meals Lantus 15 units q HS  Inpatient Diabetes Program Recommendations:    Please consider reducing Novolog correction to very sensitive.  Also consider reducing Novolog meal coverage to 4 units tid with meals (hold if patient eats less than 50% or NPO).  It appears that patient is sensitive to insulin.    Thanks,  Beryl Meager, RN, BC-ADM Inpatient Diabetes Coordinator Pager [phone]

## 2020-09-27 NOTE — Progress Notes (Signed)
Pt CBG this was 51 mg/dl, pt appeared lethargic, orange juice and crackers but th pt declined stating that he was fine. Pt advised to stay back while others go for breakfast due to him being unsteady on his feet, but pt refused and walked down to the cafeteria for breakfast. His BP was also elevated  166/111 sitting and 170/116 standing. Pt was offered clonidine as ordered, again pt refused stating that he was okay and did not need medication. Pt AC and NP on call notified. Pt became loud and irritable when being explained what could happen if he does lower his BP or bring up his blood glucose.  Staff will continue to monitor and offer encouragement.
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
## 2020-09-27 NOTE — Plan of Care (Addendum)
Nurse discussed anxiety, depression, and coping skills with patient.  

## 2020-09-27 NOTE — Progress Notes (Signed)
Patient on the 300 hall talking to the other patients sitting on the floor. Staff explained to patient he can not be on the 300 hall talking to the other patients. Staff explain he can use the phone and go to the dayroom 300 hall. Patient said oh so you are treating me bad. I can not talk to the patients and have fun. Patient said "This is why you will want to kill yourself." Patient continue to talk rudely and loud walking to the 400 hall.

## 2020-09-27 NOTE — Progress Notes (Signed)
Staff went in the patient room to do 15 minutes safety checks. Patient start yelling, " why are you coming in here now."  "You do not want me to what I want to do." RN notify

## 2020-09-27 NOTE — Progress Notes (Signed)
Pt has been irritable this evening, wanting to get out of here. Pt BP this evening was 160/106, pt given clonidine 0.1 mg as ordered.

## 2020-09-27 NOTE — Progress Notes (Signed)
   09/26/20 2259  Psych Admission Type (Psych Patients Only)  Admission Status Involuntary  Psychosocial Assessment  Patient Complaints Anxiety;Irritability  Eye Contact Avoids  Facial Expression Flat  Affect Depressed;Angry  Speech Slow  Interaction Avoidant  Motor Activity Slow  Appearance/Hygiene Unremarkable  Behavior Characteristics Appropriate to situation  Mood Angry  Thought Process  Coherency Unable to assess (nonverbal at the moment)  Content UTA  Delusions UTA  Perception UTA  Hallucination UTA  Judgment Impaired  Confusion UTA  Danger to Self  Current suicidal ideation? Denies (refused to answer)  Self-Injurious Behavior No self-injurious ideation or behavior indicators observed or expressed   Agreement Not to Harm Self Yes (patient would not comment)  Danger to Others  Danger to Others None reported or observed

## 2020-09-27 NOTE — Progress Notes (Signed)
Pt. Was seen/heard walking by the nurses station saying "This is why people want to fucking kill themselves in here" nurse and tech were informed.

## 2020-09-27 NOTE — Progress Notes (Addendum)
D:  Patient's self inventory sheet, patient has poor sleep.  Sleep medication not helpful.  Fair appetite, low energy level, good concentration.  Rated depression 5, hopeless 6, anxiety #8. Withdrawals, craving, agitation, nausea, irritability.  Denied SI.  Physical problems, pain, headaches, worst pain in past 24 hours is #6, back, R leg.  Goal is to have patience and be silent.   A:  Medications administerered per MD orders.  Emotional support and encouragement given patient. R:  Denied SI and HI, contracts for safety.  Denied A/V hallucinations.  Safety maintained with 15 minute checks.  Patient would not take his morning medication until later in the morning.  Stated people do not listen to him.  He knows his body better than anyone. Patient stated he has been through a lot which has caused him a lot of stress and depression.

## 2020-09-27 NOTE — Progress Notes (Signed)
Psychoeducational Group Note  Date:  09/27/2020 Time:  2000  Group Topic/Focus:  Wrap up group  Participation Level: Did Not Attend  Participation Quality:  Not Applicable  Affect:  Not Applicable  Cognitive:  Not Applicable  Insight:  Not Applicable  Engagement in Group: Not Applicable  Additional Comments:  Did not attend.   Marcille Buffy 09/27/2020, 8:58 PM

## 2020-09-27 NOTE — Progress Notes (Addendum)
Nurse left message for call back to diabetic coordinator for this patient.

## 2020-09-27 NOTE — [facility] Suicide Risk Assessment (Signed)
[facility] INPATIENT:  Family/Significant Other Suicide Prevention Education  Suicide Prevention Education:  Patient Refusal for Family/Significant Other Suicide Prevention Education: The patient [name] has refused to provide written consent for family/significant other to be provided Family/Significant Other Suicide Prevention Education during admission and/or prior to discharge.  Physician notified.  Lynnell Chad 09/27/2020, 4:37 PM
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
## 2020-09-27 NOTE — Plan of Care (Signed)
Nurse discussed anxiety, depression and coping skills with patient.  

## 2020-09-27 NOTE — Progress Notes (Signed)
The 3 am CBG not monitored as pt refused stating he did not want to be here, he wants to go home since there is nothing wrong with him.

## 2020-09-27 NOTE — Progress Notes (Signed)
Pt has been very irritable this evening resistant to care, verbally abusive towards staff, mumbling to himself. Pt does not like to be redirected, pt was observed walking down towards [address] hall, pt was reminded that he could not go to other pts' hall, pt became irritable walk back to his room, closed the door and went and sat on the [facility] unit. Pt refused CBG and V/S monitored, stating that he knew his and there was nothing wrong with him. Pt also refused his insulin this evening, NP on call notified. Will continue to monitor.

## 2020-09-28 DIAGNOSIS — I10 Essential (primary) hypertension: Secondary | ICD-10-CM

## 2020-09-28 LAB — MICROALBUMIN, URINE: Microalb, Ur: 249 ug/mL — ABNORMAL HIGH

## 2020-09-28 LAB — GLUCOSE, CAPILLARY
Glucose-Capillary: 347 mg/dL — ABNORMAL HIGH (ref 70–99)
Glucose-Capillary: 140 mg/dL — ABNORMAL HIGH (ref 70–99)
Glucose-Capillary: 373 mg/dL — ABNORMAL HIGH (ref 70–99)

## 2020-09-28 MED ORDER — GLUCERNA SHAKE PO LIQD
237.0000 mL | Freq: Three times a day (TID) | ORAL | Status: DC
  Administered 2020-09-29 – 2020-10-08 (×12): 237 mL via ORAL
  Filled 2020-09-28 (×39): qty 237

## 2020-09-28 NOTE — Progress Notes (Signed)
Recreation Therapy Notes  Date:  8.1.22 Time: 0930 Location: [address] Dayroom  Group Topic: Stress Management  Goal Area(s) Addresses:  Patient will identify positive stress management techniques. Patient will identify benefits of using stress management post d/c.  Intervention: Stress Management  Activity :  Meditation.  LRT played a meditation that focused on challenging negative thoughts.  Patients were to listen and follow along as meditation was played to fully engaged in activity.  Education:  Stress Management, Discharge Planning.   Education Outcome: Acknowledges Education  Clinical Observations/Feedback: Pt did not attend group session.    Caroll Rancher, LRT/CTRS         Caroll Rancher A 09/28/2020 11:27 AM

## 2020-09-28 NOTE — [facility] Group Notes (Signed)
Patient did not attend morning orientation/goal setting group although he was made aware of it.

## 2020-09-28 NOTE — Progress Notes (Signed)
Pt visible on the unit some, writer talked to pt about moving forward and Tx. Pt appeared to listen, pt was receptive to taking his insulin and PRN Trazodone also.     09/28/20 2300  Psych Admission Type (Psych Patients Only)  Admission Status Involuntary  Psychosocial Assessment  Patient Complaints Anxiety;Irritability  Eye Contact Avoids  Facial Expression Flat  Affect Depressed;Angry  Speech Slow  Interaction Avoidant  Motor Activity Slow  Appearance/Hygiene Unremarkable  Behavior Characteristics Anxious;Guarded;Fidgety;Elopement risk  Mood Sad;Preoccupied  Thought Process  Coherency WDL (nonverbal at the moment)  Content WDL  Delusions None reported or observed  Perception WDL  Hallucination None reported or observed  Judgment Poor  Confusion WDL  Danger to Self  Current suicidal ideation? Denies  Self-Injurious Behavior No self-injurious ideation or behavior indicators observed or expressed   Agreement Not to Harm Self Yes  Description of Agreement verbal contract for safety  Danger to Others  Danger to Others None reported or observed

## 2020-09-28 NOTE — Progress Notes (Signed)
NUTRITION ASSESSMENT  RD consulted for nutritional assessment.  INTERVENTION: 1. Supplements: Glucerna Shake po TID, each supplement provides 220 kcal and 10 grams of protein 2. Carbohydrate Counting handout in discharge instructions   NUTRITION DIAGNOSIS: Unintentional weight loss related to sub-optimal intake as evidenced by pt report.   Goal: Pt to meet >/= 90% of their estimated nutrition needs.  Monitor:  PO intake  Assessment:  Pt admitted with depression and SI. Pt with uncontrolled type 1 diabetes. Pt has fair appetite.  Pt has not been accepting of care. Will leave diet handout in discharge instructions. Will order Glucerna shakes as well as it seems pt has been skipping some meals.  Per weight records, no weight loss noted.  Height: Ht Readings from Last 1 Encounters:  09/26/20 6\' 1"  (1.854 m)    Weight: Wt Readings from Last 1 Encounters:  09/26/20 93 kg    Weight Hx: Wt Readings from Last 10 Encounters:  09/26/20 93 kg  09/24/20 92.1 kg  02/09/20 87.5 kg  01/14/20 87.5 kg  12/06/19 90.3 kg  03/30/19 81.6 kg  01/15/19 84.4 kg  04/20/18 79.2 kg  06/27/17 87.9 kg  12/14/16 88.9 kg    BMI:  Body mass index is 27.05 kg/m. Pt meets criteria for overweight based on current BMI.  Estimated Nutritional Needs: Kcal: 25-30 kcal/kg Protein: > 1 gram protein/kg Fluid: 1 ml/kcal  Diet Order:  Diet Order             Diet Carb Modified Fluid consistency: Thin; Room service appropriate? Yes  Diet effective now                  Pt is also offered choice of unit snacks mid-morning and mid-afternoon.   Lab results and medications reviewed.   12/16/16, MS, RD, LDN Inpatient Clinical Dietitian Contact information available via Amion

## 2020-09-28 NOTE — Progress Notes (Signed)
D:  Patient denied SI and HI, contracts for safety.  Denied A/V hallucinations.   A:  Medications administered per MD orders.  Emotional support and encouragement given patient. R:  Safety maintained with 15 minute checks.  

## 2020-09-28 NOTE — Plan of Care (Signed)
Nurse discussed anxiety and coping skills with patient. 

## 2020-09-28 NOTE — BHH Group Notes (Signed)
Therapy Type: Group Therapy  Participation Level:  Minimal   Patients received a worksheet with an outline of 2 gingerbread men with a separation in the middle of the page. One sign designated what the pt sees about themselves and the other is what others see. Pts were asked to introduce themselves and share something they like about themself. Pts were then asked to draw, write or color how they view themselves as well as how they are viewed by others. CSW led discussion about the feelings and words associated with each side.   Patient Summary:   The patient did not share an introduction due to coming into the group late. Pt was appropriate and participated in group discussion.

## 2020-09-28 NOTE — B[facility] Group Notes (Signed)
Patient did not attend group.    Spiritual care group on grief and loss facilitated by chaplain Katy Emmanuell Kantz, B[facility]   Group Goal:   Support / Education around grief and loss   Members engage in facilitated group support and psycho-social education.   Group Description:   Following introductions and group rules, group members engaged in facilitated group dialog and support around topic of loss, with particular support around experiences of loss in their lives. Group Identified types of loss (relationships / self / things) and identified patterns, circumstances, and changes that precipitate losses. Reflected on thoughts / feelings around loss, normalized grief responses, and recognized variety in grief experience. Group noted Worden's four tasks of grief in discussion.   Group drew on Adlerian / Rogerian, narrative, MI,    

## 2020-09-28 NOTE — Progress Notes (Signed)
Discharge Note:  Patient discharged.  Suicide prevention information given and discussed with patient who stated he understood and had no questions.  Denied SI and HI.  Denied A/V hallucinations.  Patient stated he received all his belongings, clothing, toiletries, etc.  Patient stated he appreciated all assistance received from [facility] staff.

## 2020-09-28 NOTE — Discharge Instructions (Signed)

## 2020-09-28 NOTE — Tx Team (Signed)
Interdisciplinary Treatment and Diagnostic Plan Update  09/28/2020 Time of Session: 9:30am [name] MRN: [id number]  Principal Diagnosis: <principal problem not specified>  Secondary Diagnoses: Active Problems:   MDD (major depressive disorder), recurrent severe, without psychosis ([place])   Current Medications:  Current Facility-Administered Medications  Medication Dose Route Frequency Provider Last Rate Last Admin   acetaminophen (TYLENOL) tablet 650 mg  650 mg Oral Q6H PRN Bobbitt, Shalon E, NP       alum & mag hydroxide-simeth (MAALOX/MYLANTA) 200-200-20 MG/5ML suspension 30 mL  30 mL Oral Q4H PRN Bobbitt, Shalon E, NP       amLODipine (NORVASC) tablet 10 mg  10 mg Oral Daily Sharma Covert, MD   10 mg at 09/28/20 1305   cloNIDine (CATAPRES) tablet 0.1 mg  0.1 mg Oral TID PRN Sharma Covert, MD   0.1 mg at 09/28/20 1311   elvitegravir-cobicistat-emtricitabine-tenofovir (GENVOYA) 150-150-200-10 MG tablet 1 tablet  1 tablet Oral Q breakfast Bobbitt, Shalon E, NP   1 tablet at 09/28/20 1304   feeding supplement (GLUCERNA SHAKE) (GLUCERNA SHAKE) liquid 237 mL  237 mL Oral TID BM Sharma Covert, MD       insulin aspart (novoLOG) injection 0-5 Units  0-5 Units Subcutaneous QHS Bobbitt, Shalon E, NP   5 Units at 09/26/20 2145   insulin aspart (novoLOG) injection 0-6 Units  0-6 Units Subcutaneous TID WC Sharma Covert, MD   4 Units at 09/28/20 1305   insulin aspart (novoLOG) injection 5 Units  5 Units Subcutaneous TID WC Sharma Covert, MD   5 Units at 09/28/20 1307   insulin glargine (LANTUS) injection 15 Units  15 Units Subcutaneous QHS Sharma Covert, MD   15 Units at 09/26/20 2142   magnesium hydroxide (MILK OF MAGNESIA) suspension 30 mL  30 mL Oral Daily PRN Bobbitt, Shalon E, NP       ondansetron (ZOFRAN-ODT) disintegrating tablet 8 mg  8 mg Oral Q8H PRN Sharma Covert, MD       sertraline (ZOLOFT) tablet 25 mg  25 mg Oral Daily Sharma Covert, MD    25 mg at 09/28/20 1305   traZODone (DESYREL) tablet 50 mg  50 mg Oral QHS PRN Bobbitt, Shalon E, NP   50 mg at 09/26/20 2149   PTA Medications: Medications Prior to Admission  Medication Sig Dispense Refill Last Dose   amLODipine (NORVASC) 5 MG tablet Take 5 mg by mouth daily.      blood glucose meter kit and supplies Dispense based on patient and insurance preference. Use up to four times daily as directed. (FOR ICD-10 E10.9, E11.9). (Patient taking differently: 1 each by Other route See admin instructions. Dispense based on patient and insurance preference. Use up to four times daily as directed. (FOR ICD-10 E10.9, E11.9).) 1 each 0    Blood Glucose Monitoring Suppl (TRUE METRIX METER) w/Device KIT USE AS DIRECTED (Patient taking differently: 1 each by Other route as directed.) 1 kit 0    citalopram (CELEXA) 20 MG tablet TAKE 1 TABLET BY MOUTH DAILY 30 tablet 11    elvitegravir-cobicistat-emtricitabine-tenofovir (GENVOYA) 150-150-200-10 MG TABS tablet Take 1 tablet by mouth daily with breakfast. 30 tablet 11    glucose blood test strip USE AS DIRECTED (Patient taking differently: 1 each by Other route as directed.) 150 strip 0    insulin aspart (NOVOLOG) 100 UNIT/ML injection INJECT 5 UNITS INTO THE SKIN THREE TIMES DAILY WITH MEALS. (Patient taking differently: Inject 4 Units  into the skin 3 (three) times daily with meals.) 10 mL 0    insulin glargine (LANTUS) 100 UNIT/ML injection INJECT 26 UNITS INTO THE SKIN DAILY. (Patient taking differently: Inject 35 Units into the skin daily. Note from 7/13 hospialization for hypoglycemia MD wanted to decrease Lantus to 18 U daily.  Pt reported to me he took 35 U bid. So unclear what does he should be on vs what he takes.) 10 mL 0    Insulin Syringe-Needle U-100 30G X 1/2" 0.5 ML MISC USE AS DIRECTED WITH INSULIN (Patient taking differently: 1 each by Other route See admin instructions. with insulin) 120 each 0    TRUEplus Lancets 28G MISC USE AS DIRECTED  (Patient taking differently: 1 each by Other route as directed.) 200 each 0     Patient Stressors:    Patient Strengths:    Treatment Modalities: Medication Management, Group therapy, Case management,  1 to 1 session with clinician, Psychoeducation, Recreational therapy.   Physician Treatment Plan for Primary Diagnosis: <principal problem not specified> Long Term Goal(s): Improvement in symptoms so as ready for discharge   Short Term Goals: Ability to identify changes in lifestyle to reduce recurrence of condition will improve Ability to verbalize feelings will improve Ability to disclose and discuss suicidal ideas Ability to demonstrate self-control will improve Ability to identify and develop effective coping behaviors will improve Ability to maintain clinical measurements within normal limits will improve Compliance with prescribed medications will improve  Medication Management: Evaluate patient's response, side effects, and tolerance of medication regimen.  Therapeutic Interventions: 1 to 1 sessions, Unit Group sessions and Medication administration.  Evaluation of Outcomes: Not Met  Physician Treatment Plan for Secondary Diagnosis: Active Problems:   MDD (major depressive disorder), recurrent severe, without psychosis ([place])  Long Term Goal(s): Improvement in symptoms so as ready for discharge   Short Term Goals: Ability to identify changes in lifestyle to reduce recurrence of condition will improve Ability to verbalize feelings will improve Ability to disclose and discuss suicidal ideas Ability to demonstrate self-control will improve Ability to identify and develop effective coping behaviors will improve Ability to maintain clinical measurements within normal limits will improve Compliance with prescribed medications will improve     Medication Management: Evaluate patient's response, side effects, and tolerance of medication regimen.  Therapeutic Interventions: 1 to  1 sessions, Unit Group sessions and Medication administration.  Evaluation of Outcomes: Not Met   RN Treatment Plan for Primary Diagnosis: <principal problem not specified> Long Term Goal(s): Knowledge of disease and therapeutic regimen to maintain health will improve  Short Term Goals: Ability to remain free from injury will improve, Ability to verbalize frustration and anger appropriately will improve, Ability to demonstrate self-control, Ability to participate in decision making will improve, Ability to verbalize feelings will improve, Ability to identify and develop effective coping behaviors will improve, and Compliance with prescribed medications will improve  Medication Management: RN will administer medications as ordered by provider, will assess and evaluate patient's response and provide education to patient for prescribed medication. RN will report any adverse and/or side effects to prescribing provider.  Therapeutic Interventions: 1 on 1 counseling sessions, Psychoeducation, Medication administration, Evaluate responses to treatment, Monitor vital signs and CBGs as ordered, Perform/monitor CIWA, COWS, AIMS and Fall Risk screenings as ordered, Perform wound care treatments as ordered.  Evaluation of Outcomes: Not Met   LCSW Treatment Plan for Primary Diagnosis: <principal problem not specified> Long Term Goal(s): Safe transition to appropriate next level of  care at discharge, Engage patient in therapeutic group addressing interpersonal concerns.  Short Term Goals: Engage patient in aftercare planning with referrals and resources, Increase social support, Increase ability to appropriately verbalize feelings, Increase emotional regulation, Facilitate acceptance of mental health diagnosis and concerns, and Increase skills for wellness and recovery  Therapeutic Interventions: Assess for all discharge needs, 1 to 1 time with Social worker, Explore available resources and support systems,  Assess for adequacy in community support network, Educate family and significant other(s) on suicide prevention, Complete Psychosocial Assessment, Interpersonal group therapy.  Evaluation of Outcomes: Not Met   Progress in Treatment: Attending groups: No. Participating in groups: No. Taking medication as prescribed: Yes. Toleration medication: Yes. Family/Significant other contact made: No, will contact:  pt declined consents Patient understands diagnosis: Yes. Discussing patient identified problems/goals with staff: No. Medical problems stabilized or resolved: Yes. Denies suicidal/homicidal ideation: Yes. Issues/concerns per patient self-inventory: No.   New problem(s) identified: No, Describe:  none  New Short Term/Long Term Goal(s):medication stabilization, elimination of SI thoughts, development of comprehensive mental wellness plan.    Patient Goals:   "To leave"  Discharge Plan or Barriers: Patient recently admitted. CSW will continue to follow and assess for appropriate referrals and possible discharge planning.    Reason for Continuation of Hospitalization: Depression Medication stabilization Suicidal ideation  Estimated Length of Stay: 3-5 days  Attendees: Patient: [name] 09/28/2020   Physician: Fatima Sanger, DO 09/28/2020   Nursing:  09/28/2020  RN Care Manager: 09/28/2020   Social Worker: Darletta Moll, LCSW 09/28/2020   Recreational Therapist:  09/28/2020   Other:  09/28/2020   Other:  09/28/2020   Other: 09/28/2020    Scribe for Treatment Team: Vassie Moselle, [place] 09/28/2020 2:19 PM

## 2020-09-28 NOTE — Progress Notes (Signed)
[facility] MD Progress Note  09/28/2020 3:03 PM [name]  MRN:  [id number] Subjective:  [name] is a 35 y.o. adult, with reported past psychiatric history of MDD.  Past medical history of poorly controlled diabetes, HIV, hypertension.  Patient presents involuntarily to [facility] (09/25/2020), transferred from [facility] (09/24/2020) for SI daily for the last 5 to 10 years. [facility] stay day 3.   Last night:  Patient is not compliant with scheduled meds. Patient has refused last night's long-acting insulin, and has refused initial morning meds.  He eventually took his medicines around lunchtime. PRNs: Clonidine 0.1 mg for elevated blood pressure.  Today (09/28/2020): Patient was sitting in bed with his head bent over and his palms.  Initially patient was uncooperative with interview.  On second interaction, patient had depressed affect, eyes were swollen from crying, mood depressed and irritable.  For the majority of the interview patient looked at the floor and only nodded his head. Patient slept Number of Hours: 6.75.  Patient did not comments about his sleep or appetite.  However saw boxes of food from breakfast and lunch, that seem untouched. Patient has also been refusing vital signs and glucose checks, with his last blood sugar being 347 before lunch.  Patient initially refused prandial insulin, however RN was able to encourage patient and provide him with insulin shortly after lunch. Discussed with patient the dangers of his elevated blood glucose, that we do not have the means here to control it if it were to increase further. Stated that if patient continued to refuse his insulin and glucose check, that we might have to send him to the ED for his safety. Patient admitted to not having a bowel movement since his admission, and refused stool softeners or any bowel regiment.  Patient admitted to chronic blurry vision and floaters most likely from diabetic retinopathy.   Patient admitted to right lower ankle pain due to past orthopedic surgery, that is burning in nature.  Suggested gabapentin to the patient, however patient became irritated stating that it does not work and that he does not want to take any more pills.  Patient denied new blurriness, dizziness, abdominal pain. Patient denied HI/AVH. At the end of the interview patient became teary and stated that "none of this matters, that I do not want to see you tomorrow".  Patient endorsed hopelessness and futility to his medicines and this hospitalization.  Principal Problem: MDD (major depressive disorder), recurrent severe, without psychosis (HCC) Diagnosis: Principal Problem:   MDD (major depressive disorder), recurrent severe, without psychosis (HCC) Active Problems:   Diabetes mellitus type 1 (HCC)   HIV infection (HCC)   Essential hypertension  Total Time spent with patient: 20 minutes  Past Psychiatric History:  Patient has had previous psychiatric admissions.  He is also had psychiatric evaluations when he has been admitted to the hospital for diabetic issues.  It does appear that he has been treated with several antidepressants in the past.  He has been on citalopram for at least 2 to 3 years.  He says it is never really been effective.   Past Medical History:  Past Medical History:  Diagnosis Date   Anxiety    Asthma    no prior hospitalizations, intubations   Depression    Headache    "only w/stress" (11/01/2017)   HIV (human immunodeficiency virus infection) (HCC)    Migraine    "a few/month" (11/01/2017)   Refusal of blood transfusions as patient is  Jehovah's Witness    Seizure (HCC)    "only w/my low blood sugars"  (11/01/2017)   Type I diabetes mellitus (HCC)     Past Surgical History:  Procedure Laterality Date   FRACTURE SURGERY     IM NAILING TIBIA Right 11/01/2017   INTRAMEDULLARY (IM) NAIL TIBIALRightGeneral   ORIF ANKLE FRACTURE Right 11/01/2017   ORIF ANKLE FRACTURE Right  11/01/2017   Procedure: OPEN REDUCTION INTERNAL FIXATION (ORIF) ANKLE FRACTURE;  Surgeon: Roby Lofts, MD;  Location: [facility] OR;  Service: Orthopedics;  Laterality: Right;   TIBIA IM NAIL INSERTION Right 11/01/2017   Procedure: INTRAMEDULLARY (IM) NAIL TIBIAL;  Surgeon: Roby Lofts, MD;  Location: [facility] OR;  Service: Orthopedics;  Laterality: Right;   Family History:  Family History  Problem Relation Age of Onset   Thyroid disease Mother    Diabetes Mother    Breast cancer Maternal Grandmother    Diabetes Maternal Grandmother    Family Psychiatric  History:  Patient was unsure about his father, but did state that he felt as though his mother had been treated for psychiatric illness in the past.  His father was killed by a secondary relative when he was very young.  Social History:  Social History   Substance and Sexual Activity  Alcohol Use Yes   Alcohol/week: 7.0 standard drinks   Types: 7 Cans of beer per week     Social History   Substance and Sexual Activity  Drug Use Not Currently   Frequency: 2.0 times per week   Types: Marijuana   Comment: 11/01/2017  "weekly"    Social History   Socioeconomic History   Marital status: Single    Spouse name: Not on file   Number of children: Not on file   Years of education: Not on file   Highest education level: Not on file  Occupational History   Not on file  Tobacco Use   Smoking status: Every Day    Types: Cigars    Start date: 09/29/2015   Smokeless tobacco: Never   Tobacco comments:    11/01/2017  "2 cigarettes in ~ 6 month "  Vaping Use   Vaping Use: Never used  Substance and Sexual Activity   Alcohol use: Yes    Alcohol/week: 7.0 standard drinks    Types: 7 Cans of beer per week   Drug use: Not Currently    Frequency: 2.0 times per week    Types: Marijuana    Comment: 11/01/2017  "weekly"   Sexual activity: Yes    Partners: Male    Comment: pt. declined condoms  Other Topics Concern   Not on file  Social History  Narrative   Not on file   Social Determinants of Health   Financial Resource Strain: Not on file  Food Insecurity: Not on file  Transportation Needs: Not on file  Physical Activity: Not on file  Stress: Not on file  Social Connections: Not on file   Additional Social History:                         Sleep: Poor  Appetite:  Poor  Current Medications: Current Facility-Administered Medications  Medication Dose Route Frequency Provider Last Rate Last Admin   acetaminophen (TYLENOL) tablet 650 mg  650 mg Oral Q6H PRN Bobbitt, Shalon E, NP       alum & mag hydroxide-simeth (MAALOX/MYLANTA) 200-200-20 MG/5ML suspension 30 mL  30 mL Oral Q4H PRN  Bobbitt, Shalon E, NP       amLODipine (NORVASC) tablet 10 mg  10 mg Oral Daily Antonieta Pertlary, Greg Lawson, MD   10 mg at 09/28/20 1305   cloNIDine (CATAPRES) tablet 0.1 mg  0.1 mg Oral TID PRN Antonieta Pertlary, Greg Lawson, MD   0.1 mg at 09/28/20 1311   elvitegravir-cobicistat-emtricitabine-tenofovir (GENVOYA) 150-150-200-10 MG tablet 1 tablet  1 tablet Oral Q breakfast Bobbitt, Shalon E, NP   1 tablet at 09/28/20 1304   feeding supplement (GLUCERNA SHAKE) (GLUCERNA SHAKE) liquid 237 mL  237 mL Oral TID BM Antonieta Pertlary, Greg Lawson, MD       insulin aspart (novoLOG) injection 0-5 Units  0-5 Units Subcutaneous QHS Bobbitt, Shalon E, NP   5 Units at 09/26/20 2145   insulin aspart (novoLOG) injection 0-6 Units  0-6 Units Subcutaneous TID WC Antonieta Pertlary, Greg Lawson, MD   4 Units at 09/28/20 1305   insulin aspart (novoLOG) injection 5 Units  5 Units Subcutaneous TID WC Antonieta Pertlary, Greg Lawson, MD   5 Units at 09/28/20 1307   insulin glargine (LANTUS) injection 15 Units  15 Units Subcutaneous QHS Antonieta Pertlary, Greg Lawson, MD   15 Units at 09/26/20 2142   magnesium hydroxide (MILK OF MAGNESIA) suspension 30 mL  30 mL Oral Daily PRN Bobbitt, Shalon E, NP       ondansetron (ZOFRAN-ODT) disintegrating tablet 8 mg  8 mg Oral Q8H PRN Antonieta Pertlary, Greg Lawson, MD       sertraline (ZOLOFT) tablet  25 mg  25 mg Oral Daily Antonieta Pertlary, Greg Lawson, MD   25 mg at 09/28/20 1305   traZODone (DESYREL) tablet 50 mg  50 mg Oral QHS PRN Bobbitt, Shalon E, NP   50 mg at 09/26/20 2149    Lab Results:  Results for orders placed or performed during the hospital encounter of 09/25/20 (from the past 48 hour(s))  Glucose, capillary     Status: Abnormal   Collection Time: 09/26/20  4:59 PM  Result Value Ref Range   Glucose-Capillary 282 (H) 70 - 99 mg/dL    Comment: Glucose reference range applies only to samples taken after fasting for at least 8 hours.  Microalbumin, urine     Status: Abnormal   Collection Time: 09/26/20  5:19 PM  Result Value Ref Range   Microalb, Ur 249.0 (H) Not Estab. ug/mL    Comment: (NOTE) Performed At: [facility] [address] [place], [place] [id number] Jolene SchimkeNagendra Sanjai MD WU:[id number]:[phone]   Lipase, blood     Status: None   Collection Time: 09/26/20  6:29 PM  Result Value Ref Range   Lipase 29 11 - 51 U/L    Comment: Performed at [facility], [address] [place], [place] [address]  Amylase     Status: Abnormal   Collection Time: 09/26/20  6:29 PM  Result Value Ref Range   Amylase 111 (H) 28 - 100 U/L    Comment: Performed at [facility], [address] [place], [place] [address]  Glucose, capillary     Status: Abnormal   Collection Time: 09/26/20  7:28 PM  Result Value Ref Range   Glucose-Capillary 354 (H) 70 - 99 mg/dL    Comment: Glucose reference range applies only to samples taken after fasting for at least 8 hours.  Glucose, capillary     Status: Abnormal   Collection Time: 09/27/20  6:29 AM  Result Value Ref Range   Glucose-Capillary 51 (L) 70 - 99 mg/dL  Comment: Glucose reference range applies only to samples taken after fasting for at least 8 hours.  Glucose, capillary     Status: Abnormal   Collection Time: 09/27/20  8:20 AM  Result Value Ref Range   Glucose-Capillary 342 (H) 70 - 99 mg/dL     Comment: Glucose reference range applies only to samples taken after fasting for at least 8 hours.  Glucose, capillary     Status: Abnormal   Collection Time: 09/27/20 11:46 AM  Result Value Ref Range   Glucose-Capillary 228 (H) 70 - 99 mg/dL    Comment: Glucose reference range applies only to samples taken after fasting for at least 8 hours.  Glucose, capillary     Status: Abnormal   Collection Time: 09/27/20  4:54 PM  Result Value Ref Range   Glucose-Capillary 167 (H) 70 - 99 mg/dL    Comment: Glucose reference range applies only to samples taken after fasting for at least 8 hours.  Glucose, capillary     Status: Abnormal   Collection Time: 09/28/20 12:17 PM  Result Value Ref Range   Glucose-Capillary 347 (H) 70 - 99 mg/dL    Comment: Glucose reference range applies only to samples taken after fasting for at least 8 hours.   Comment 1 Notify RN     Blood Alcohol level:  Lab Results  Component Value Date   ETH <10 09/24/2020   ETH <10 02/09/2020    Metabolic Disorder Labs: Lab Results  Component Value Date   HGBA1C 9.7 (H) 09/09/2020   MPG 231.69 09/09/2020   MPG 205.86 02/11/2020   Lab Results  Component Value Date   PROLACTIN 6.0 09/24/2020   Lab Results  Component Value Date   CHOL 187 09/24/2020   TRIG 50 09/24/2020   HDL 100 09/24/2020   CHOLHDL 1.9 09/24/2020   VLDL 10 09/24/2020   LDLCALC 77 09/24/2020   LDLCALC 64 11/22/2019    Physical Findings: AIMS: Facial and Oral Movements Muscles of Facial Expression: None, normal Lips and Perioral Area: None, normal Jaw: None, normal Tongue: None, normal,Extremity Movements Upper (arms, wrists, hands, fingers): None, normal Lower (legs, knees, ankles, toes): None, normal, Trunk Movements Neck, shoulders, hips: None, normal, Overall Severity Severity of abnormal movements (highest score from questions above): None, normal Incapacitation due to abnormal movements: None, normal Patient's awareness of abnormal  movements (rate only patient's report): No Awareness, Dental Status Current problems with teeth and/or dentures?: Yes Does patient usually wear dentures?: Yes  CIWA:    COWS:  COWS Total Score: 4  Musculoskeletal: Strength & Muscle Tone: within normal limits Gait & Station: normal Patient leans: N/A  Psychiatric Specialty Exam:  Presentation  General Appearance: Fairly Groomed  Eye Contact:Minimal  Speech:Normal Rate (Patient overall does not speak very much, mainly communicating with head nods.)  Speech Volume:Normal  Handedness:Right   Mood and Affect  Mood:Depressed; Dysphoric; Hopeless; Irritable  Affect:Depressed; Tearful   Thought Process  Thought Processes:Coherent; Goal Directed; Linear  Descriptions of Associations:Intact  Orientation:Full (Time, Place and Person)  Thought Content:Rumination (Ruminates on how there is no point to current treatment and hospitalization, but he does not want the help, that he does not want tomorrow to come.)  History of Schizophrenia/Schizoaffective disorder:No  Duration of Psychotic Symptoms:No data recorded Hallucinations:Hallucinations: None Ideas of Reference:None  Suicidal Thoughts:Suicidal Thoughts: Yes, Passive Homicidal Thoughts:Homicidal Thoughts: No  Sensorium  Memory:Immediate Fair; Recent Fair; Remote Fair  Judgment:Impaired  Insight:Lacking   Executive Functions  Concentration:Fair  Attention Span:Fair  Recall:Fair  Fund of Knowledge:Fair  Language:Good   Psychomotor Activity  Psychomotor Activity: Psychomotor Activity: Normal  Assets  Assets:Communication Skills; Resilience   Sleep  Sleep: Sleep: Fair Number of Hours of Sleep: 6.75   Physical Exam: Physical Exam HENT:     Head: Normocephalic and atraumatic.  Pulmonary:     Effort: Pulmonary effort is normal.  Neurological:     Mental Status: He is alert.   Review of Systems  Constitutional:  Negative for fever.  Eyes:   Positive for blurred vision (Chronic with floaters, likely due to diabetic retinopathy).  Respiratory:  Negative for shortness of breath.   Cardiovascular:  Negative for chest pain.  Gastrointestinal:  Positive for constipation (Patient stated that he has not had a bowel movement since admission.). Negative for abdominal pain, nausea and vomiting.  Musculoskeletal:  Positive for joint pain (Right ankle chronic, dull and burning since surgery).  Neurological:  Negative for dizziness.  Blood pressure (!) 152/112, pulse 97, temperature 98.4 F (36.9 C), temperature source Oral, resp. rate 18, height  (1.854 m), weight 93 kg, SpO2 100 %. Body mass index is 27.05 kg/m.   Treatment Plan Summary: Daily contact with patient to assess and evaluate symptoms and progress in treatment and Medication management  [name] is a 35 y.o. adult, with reported past psychiatric history of MDD.  Past medical history of poorly controlled diabetes, HIV, hypertension.  Patient presents involuntarily to [facility] (09/25/2020), transferred from [facility] (09/24/2020) for SI daily for the last 5 to 10 years. [facility] stay day 3.   PSYCHIATRIC DIAGNOSIS & TREATMENT:  MDD - Continue Zoloft 25 mg p.o. daily - Continue Glucerna shake liquid 237 mL p.o. 3 times daily between meals for decreased appetite due to depression    MEDICAL MANAGEMENT: Hypertension - Continue amlodipine 10 mg p.o. daily  Diabetes - Continue sliding scale  Lantus 15 units IM nightly  NovoLog 5 units a.m. 3 times daily with meal  HIV - Continue Genvoya 1 tablet daily with breakfast  PRN's -Trazadone  PO qHS PRN for insomnia -Alum & mag hydroxide-simeth 30ml PO qHS PRN for GERD -Magnesium hydroxide 30ml PO daily PRN for constipation -Acetaminophen tablet  PO PRN q6hrs for mild pain  -Clonidine 0.1 mg p.o. 3 times daily as needed for SBP >150 -Zofran 8 mg p.o. every 8 hours as needed for nausea and  vomiting  Discharge Planning: -Social work and case management to assist with discharge planning and identification of hospital follow-up needs prior to discharge -Estimated LOS: 3-4 days -Discharge Concerns: Need to establish a safety plan; Medication compliance and effectiveness -Discharge Goals: Return home with outpatient referrals for mental health follow-up including medication management/psychotherapy  Safety and Monitoring: -Voluntary admission to inpatient psychiatric unit for safety, stabilization and treatment -Daily contact with patient to assess and evaluate symptoms and progress in treatment -Patient's case to be discussed in multi-disciplinary team meeting -Observation Level : q15 minute checks -Vital signs: q12 hours -Precautions: suicide, elopement, and assault   Princess Bruins, DO, PGY-1 09/28/2020, 3:03 PM

## 2020-09-28 NOTE — Progress Notes (Signed)
Pt continues to refuse care, did allow staff to check his 3 am and 6 am CBG nor his V/S this am.

## 2020-09-29 LAB — BASIC METABOLIC PANEL
Anion gap: 8 (ref 5–15)
BUN: 23 mg/dL — ABNORMAL HIGH (ref 6–20)
CO2: 26 mmol/L (ref 22–32)
Calcium: 9.7 mg/dL (ref 8.9–10.3)
Chloride: 101 mmol/L (ref 98–111)
Creatinine, Ser: 1.41 mg/dL — ABNORMAL HIGH (ref 0.61–1.24)
GFR, Estimated: >60 mL/min (ref >60)
Glucose, Bld: 203 mg/dL — ABNORMAL HIGH (ref 70–99)
Potassium: 4.6 mmol/L (ref 3.5–5.1)
Sodium: 135 mmol/L (ref 135–145)

## 2020-09-29 LAB — GLUCOSE, CAPILLARY
Glucose-Capillary: 82 mg/dL (ref 70–99)
Glucose-Capillary: 382 mg/dL — ABNORMAL HIGH (ref 70–99)
Glucose-Capillary: 148 mg/dL — ABNORMAL HIGH (ref 70–99)
Glucose-Capillary: 53 mg/dL — ABNORMAL LOW (ref 70–99)
Glucose-Capillary: 292 mg/dL — ABNORMAL HIGH (ref 70–99)

## 2020-09-29 MED ORDER — SERTRALINE HCL 50 MG PO TABS
50.0000 mg | ORAL_TABLET | Freq: Every day | ORAL | Status: DC
  Administered 2020-09-30 – 2020-10-07 (×8): 50 mg via ORAL
  Filled 2020-09-29 (×11): qty 1

## 2020-09-29 MED ORDER — CLONIDINE HCL 0.1 MG PO TABS
0.1000 mg | ORAL_TABLET | Freq: Three times a day (TID) | ORAL | Status: DC | PRN
  Administered 2020-10-01: 0.1 mg via ORAL
  Filled 2020-09-29: qty 1

## 2020-09-29 NOTE — Progress Notes (Addendum)
[facility] MD Progress Note  09/29/2020 1:38 PM [name]  MRN:  [id number] Subjective:  [name] is a 35 y.o. adult, with reported past psychiatric history of MDD.  Past medical history of poorly controlled diabetes, HIV, hypertension.  Patient presents involuntarily to [facility] (09/25/2020), transferred from [facility] (09/24/2020) for SI daily for the last 5 to 10 years. [facility] stay day 4.   Last night:  Patient is compliant with scheduled meds.  Patient accepted vital checks, all medicines today.  Patient's morning blood glucose was 82. PRNs: Trazadone for sleep  Today (09/29/2020): Patient was seen standing in his room in the dark, affect still depressed, eyes are still swollen as a from crying. Today patient was more willing to talk to this author, although still irritable about his admission. Patient slept Number of Hours: 6.75. Patient did not comments about his sleep. Patient stated that he was able to eat a bit.  Discussed with patient the dangers of his elevated blood glucose, that we do not have the means here to control it if it were to increase further. Stated that if patient continued to refuse his insulin and glucose check, that we might have to send him to the ED for his safety. Patient said that he was able to have a bowel movement yesterday. Patient admitted to chronic blurry vision and floaters most likely from diabetic retinopathy, however stated that it has not worsened or changed.  When asked if this has improved, patient did not answer.  In regards to right lower ankle burning pain, patient still denied gabapentin.  Patient denied new blurriness or change in vision, dizziness, abdominal pain, nausea or vomiting, chest pain, shortness of breath. Patient denied HI/AVH.  Patient still endorsed that his admission and these medicines are futile and that he still does not want help.  Principal Problem: MDD (major depressive disorder), recurrent  severe, without psychosis (HCC) Diagnosis: Principal Problem:   MDD (major depressive disorder), recurrent severe, without psychosis (HCC) Active Problems:   Diabetes mellitus type 1 (HCC)   HIV infection (HCC)   Essential hypertension  Total Time spent with patient: 20 minutes  Past Psychiatric History:  Patient has had previous psychiatric admissions.  He is also had psychiatric evaluations when he has been admitted to the hospital for diabetic issues.  It does appear that he has been treated with several antidepressants in the past.  He has been on citalopram for at least 2 to 3 years.  He says it is never really been effective.  Past Medical History:  Past Medical History:  Diagnosis Date   Anxiety    Asthma    no prior hospitalizations, intubations   Depression    Headache    "only w/stress" (11/01/2017)   HIV (human immunodeficiency virus infection) (HCC)    Migraine    "a few/month" (11/01/2017)   Refusal of blood transfusions as patient is Jehovah's Witness    Seizure (HCC)    "only w/my low blood sugars"  (11/01/2017)   Type I diabetes mellitus (HCC)     Past Surgical History:  Procedure Laterality Date   FRACTURE SURGERY     IM NAILING TIBIA Right 11/01/2017   INTRAMEDULLARY (IM) NAIL TIBIALRightGeneral   ORIF ANKLE FRACTURE Right 11/01/2017   ORIF ANKLE FRACTURE Right 11/01/2017   Procedure: OPEN REDUCTION INTERNAL FIXATION (ORIF) ANKLE FRACTURE;  Surgeon: Roby Lofts, MD;  Location: [facility] OR;  Service: Orthopedics;  Laterality: Right;   TIBIA IM NAIL  INSERTION Right 11/01/2017   Procedure: INTRAMEDULLARY (IM) NAIL TIBIAL;  Surgeon: Roby Lofts, MD;  Location: [facility] OR;  Service: Orthopedics;  Laterality: Right;   Family History:  Family History  Problem Relation Age of Onset   Thyroid disease Mother    Diabetes Mother    Breast cancer Maternal Grandmother    Diabetes Maternal Grandmother    Family Psychiatric  History:  Patient was unsure about his father, but  did state that he felt as though his mother had been treated for psychiatric illness in the past.  His father was killed by a secondary relative when he was very young.  Social History:  Social History   Substance and Sexual Activity  Alcohol Use Yes   Alcohol/week: 7.0 standard drinks   Types: 7 Cans of beer per week     Social History   Substance and Sexual Activity  Drug Use Not Currently   Frequency: 2.0 times per week   Types: Marijuana   Comment: 11/01/2017  "weekly"    Social History   Socioeconomic History   Marital status: Single    Spouse name: Not on file   Number of children: Not on file   Years of education: Not on file   Highest education level: Not on file  Occupational History   Not on file  Tobacco Use   Smoking status: Every Day    Types: Cigars    Start date: 09/29/2015   Smokeless tobacco: Never   Tobacco comments:    11/01/2017  "2 cigarettes in ~ 6 month "  Vaping Use   Vaping Use: Never used  Substance and Sexual Activity   Alcohol use: Yes    Alcohol/week: 7.0 standard drinks    Types: 7 Cans of beer per week   Drug use: Not Currently    Frequency: 2.0 times per week    Types: Marijuana    Comment: 11/01/2017  "weekly"   Sexual activity: Yes    Partners: Male    Comment: pt. declined condoms  Other Topics Concern   Not on file  Social History Narrative   Not on file   Social Determinants of Health   Financial Resource Strain: Not on file  Food Insecurity: Not on file  Transportation Needs: Not on file  Physical Activity: Not on file  Stress: Not on file  Social Connections: Not on file   Additional Social History:  Specify valuables returned:  (clothing, toiletries, misc items)                      Sleep: Fair  Appetite:  Poor  Current Medications: Current Facility-Administered Medications  Medication Dose Route Frequency Provider Last Rate Last Admin   acetaminophen (TYLENOL) tablet 650 mg  650 mg Oral Q6H PRN Bobbitt,  Shalon E, NP       alum & mag hydroxide-simeth (MAALOX/MYLANTA) 200-200-20 MG/5ML suspension 30 mL  30 mL Oral Q4H PRN Bobbitt, Shalon E, NP       amLODipine (NORVASC) tablet 10 mg  10 mg Oral Daily Antonieta Pert, MD   10 mg at 09/29/20 1022   cloNIDine (CATAPRES) tablet 0.1 mg  0.1 mg Oral Q8H PRN Comer Locket, MD       elvitegravir-cobicistat-emtricitabine-tenofovir (GENVOYA) 150-150-200-10 MG tablet 1 tablet  1 tablet Oral Q breakfast Bobbitt, Shalon E, NP   1 tablet at 09/29/20 1022   feeding supplement (GLUCERNA SHAKE) (GLUCERNA SHAKE) liquid 237 mL  237 mL Oral  TID BM Antonieta Pert, MD   237 mL at 09/29/20 1023   insulin aspart (novoLOG) injection 0-5 Units  0-5 Units Subcutaneous QHS Bobbitt, Shalon E, NP   4 Units at 09/28/20 2208   insulin aspart (novoLOG) injection 0-6 Units  0-6 Units Subcutaneous TID WC Antonieta Pert, MD   5 Units at 09/29/20 1210   insulin aspart (novoLOG) injection 5 Units  5 Units Subcutaneous TID WC Antonieta Pert, MD   5 Units at 09/29/20 1210   insulin glargine (LANTUS) injection 15 Units  15 Units Subcutaneous QHS Antonieta Pert, MD   15 Units at 09/28/20 2208   magnesium hydroxide (MILK OF MAGNESIA) suspension 30 mL  30 mL Oral Daily PRN Bobbitt, Shalon E, NP       ondansetron (ZOFRAN-ODT) disintegrating tablet 8 mg  8 mg Oral Q8H PRN Antonieta Pert, MD       [START ON 09/30/2020] sertraline (ZOLOFT) tablet 50 mg  50 mg Oral Daily Princess Bruins, DO       traZODone (DESYREL) tablet 50 mg  50 mg Oral QHS PRN Bobbitt, Shalon E, NP   50 mg at 09/28/20 2130    Lab Results:  Results for orders placed or performed during the hospital encounter of 09/25/20 (from the past 48 hour(s))  Glucose, capillary     Status: Abnormal   Collection Time: 09/27/20  4:54 PM  Result Value Ref Range   Glucose-Capillary 167 (H) 70 - 99 mg/dL    Comment: Glucose reference range applies only to samples taken after fasting for at least 8 hours.  Glucose,  capillary     Status: Abnormal   Collection Time: 09/28/20 12:17 PM  Result Value Ref Range   Glucose-Capillary 347 (H) 70 - 99 mg/dL    Comment: Glucose reference range applies only to samples taken after fasting for at least 8 hours.   Comment 1 Notify RN   Glucose, capillary     Status: Abnormal   Collection Time: 09/28/20  5:08 PM  Result Value Ref Range   Glucose-Capillary 140 (H) 70 - 99 mg/dL    Comment: Glucose reference range applies only to samples taken after fasting for at least 8 hours.  Glucose, capillary     Status: Abnormal   Collection Time: 09/28/20  7:56 PM  Result Value Ref Range   Glucose-Capillary 373 (H) 70 - 99 mg/dL    Comment: Glucose reference range applies only to samples taken after fasting for at least 8 hours.  Glucose, capillary     Status: None   Collection Time: 09/29/20  6:21 AM  Result Value Ref Range   Glucose-Capillary 82 70 - 99 mg/dL    Comment: Glucose reference range applies only to samples taken after fasting for at least 8 hours.  Glucose, capillary     Status: Abnormal   Collection Time: 09/29/20 11:47 AM  Result Value Ref Range   Glucose-Capillary 382 (H) 70 - 99 mg/dL    Comment: Glucose reference range applies only to samples taken after fasting for at least 8 hours.   Comment 1 Notify RN     Blood Alcohol level:  Lab Results  Component Value Date   ETH <10 09/24/2020   ETH <10 02/09/2020    Metabolic Disorder Labs: Lab Results  Component Value Date   HGBA1C 9.7 (H) 09/09/2020   MPG 231.69 09/09/2020   MPG 205.86 02/11/2020   Lab Results  Component Value Date   PROLACTIN  6.0 09/24/2020   Lab Results  Component Value Date   CHOL 187 09/24/2020   TRIG 50 09/24/2020   HDL 100 09/24/2020   CHOLHDL 1.9 09/24/2020   VLDL 10 09/24/2020   LDLCALC 77 09/24/2020   LDLCALC 64 11/22/2019    Physical Findings: AIMS: Facial and Oral Movements Muscles of Facial Expression: None, normal Lips and Perioral Area: None,  normal Jaw: None, normal Tongue: None, normal,Extremity Movements Upper (arms, wrists, hands, fingers): None, normal Lower (legs, knees, ankles, toes): None, normal, Trunk Movements Neck, shoulders, hips: None, normal, Overall Severity Severity of abnormal movements (highest score from questions above): None, normal Incapacitation due to abnormal movements: None, normal Patient's awareness of abnormal movements (rate only patient's report): No Awareness, Dental Status Current problems with teeth and/or dentures?: Yes Does patient usually wear dentures?: Yes  CIWA:    COWS:  COWS Total Score: 4  Musculoskeletal: Strength & Muscle Tone: within normal limits Gait & Station: normal Patient leans: N/A  Psychiatric Specialty Exam:  Presentation  General Appearance: Fairly Groomed  Eye Contact:Minimal (Patient mainly looked at the floor during interview)  Speech:Clear and Coherent; Normal Rate  Speech Volume:Normal  Handedness:Right   Mood and Affect  Mood:Dysphoric; Depressed; Irritable; Hopeless  Affect:Tearful; Depressed   Thought Process  Thought Processes:Coherent; Linear; Goal Directed  Descriptions of Associations:Intact  Orientation:Full (Time, Place and Person)  Thought Content:Rumination (Patient continues to talk about how futile his medicines and this hospitalization is, and that nothing is going to change.) Denies AVH, paranoia or delusions and no acute psychosis on exam  History of Schizophrenia/Schizoaffective disorder:No  Duration of Psychotic Symptoms:No data recorded Hallucinations:Hallucinations: None Ideas of Reference:None  Suicidal Thoughts:Suicidal Thoughts: Yes, Passive (denied SI on attending exam) Homicidal Thoughts:Homicidal Thoughts: No  Sensorium  Memory:Immediate Fair; Recent Fair; Remote Fair  Judgment:Poor  Insight:Poor   Executive Functions  Concentration:Fair  Attention Span:Fair  Recall:Fair  Fund of  Knowledge:Fair  Language:Good   Psychomotor Activity  Psychomotor Activity: Psychomotor Activity: Normal  Assets  Assets:Communication Skills; Resilience   Sleep  Sleep: Sleep: Fair Number of Hours of Sleep: 6.75   Physical Exam: Physical Exam Vitals and nursing note reviewed.  HENT:     Head: Normocephalic and atraumatic.  Pulmonary:     Effort: Pulmonary effort is normal.  Skin:    General: Skin is warm and dry.     Comments: Multiple areas of healing scars on arms bilaterally which he states are from old chicken pox scaring - no acute pustules, erythema, or induration noted.   Neurological:     Mental Status: He is alert and oriented to person, place, and time.   Review of Systems  Constitutional:  Negative for fever.  Eyes:  Positive for blurred vision (Chronic with floaters, likely due to diabetic retinopathy).  Respiratory:  Negative for shortness of breath.   Cardiovascular:  Negative for chest pain.  Gastrointestinal:  Negative for abdominal pain, constipation (Patient stated that he has not had a bowel movement since admission.), nausea and vomiting.  Musculoskeletal:  Positive for joint pain (Right ankle chronic, dull and burning since surgery).  Neurological:  Negative for dizziness.  Blood pressure 135/72, pulse 70, temperature 98 F (36.7 C), temperature source Oral, resp. rate 18, height 6\' 1"  (1.854 m), weight 93 kg, SpO2 100 %. Body mass index is 27.05 kg/m.   Treatment Plan Summary: Daily contact with patient to assess and evaluate symptoms and progress in treatment and Medication management  [name] is a  35 y.o. adult, with reported past psychiatric history of MDD.  Past medical history of poorly controlled diabetes, HIV, hypertension.  [facility] (09/24/2020[facility] for SI daily for the last 5 to 10 years. [facility].   PSYCHIATRIC DIAGNOSIS &  TREATMENT:  MDD - Increase Zoloft 25 mg p.o. daily to 50 mg p.o. daily - Continue Glucerna shake liquid 237 mL p.o. 3 times daily between meals for decreased appetite due to depression    MEDICAL MANAGEMENT: Hypertension - Continue amlodipine 10 mg p.o. daily  Diabetes - Continue sliding scale  Lantus 15 units IM nightly  NovoLog 5 units a.m. 3 times daily with meal  HIV - Continue Genvoya 1 tablet daily with breakfast  Elevated Creatinine (1.50) - Encouraging po hydration and rechecking BMP tonight  Hyponatremia (Na+ 133) and Hyperkalemia (K+ 5.3) on admission - Rechecking BMP tonight for trending and EKG  PRN's -Trazodone 50mg  PO qHS PRN for insomnia -Alum & mag hydroxide-simeth 30ml PO qHS PRN for GERD -Magnesium hydroxide 30ml PO daily PRN for constipation -Acetaminophen tablet 650mg  PO PRN q6hrs for mild pain  -Clonidine 0.1 mg p.o. 3 times daily as needed for SBP >150 -Zofran 8 mg p.o. every 8 hours as needed for nausea and vomiting  Discharge Planning: -Social work and case management to assist with discharge planning and identification of hospital follow-up needs prior to discharge -Estimated LOS: 3-4 days -Discharge Concerns: Need to establish a safety plan; Medication compliance and effectiveness -Discharge Goals: Return home with outpatient referrals for mental health follow-up including medication management/psychotherapy  Safety and Monitoring: -Involuntary admission to inpatient psychiatric unit for safety, stabilization and treatment -Daily contact with patient to assess and evaluate symptoms and progress in treatment -Patient's case to be discussed in multi-disciplinary team meeting -Observation Level : q15 minute checks -Vital signs: q12 hours -Precautions: suicide, elopement, and assault   Princess BruinsJulie Nguyen, DO, PGY-1 09/29/2020, 1:38 PM

## 2020-09-29 NOTE — Progress Notes (Addendum)
    Results for [name], [name] (MRN [id number]) as of 09/29/2020 20:05  Ref. Range 09/29/2020 17:13 09/29/2020 18:53 09/29/2020 19:57  Glucose-Capillary Latest Ref Range: 70 - 99 mg/dL 878 (H)  53 (L)   Patient alert and oriented x 4 visible in dayroom no symptoms but blood glucose 53, 8 oz of ginger ale given. Will recheck the blood sugar. Provider notified.  Results for [name], [name] (MRN [id number]) as of 09/30/2020 06:21  Ref. Range 09/29/2020 17:13 09/29/2020 18:53 09/29/2020 19:57 09/29/2020 22:31 09/30/2020 05:57  Glucose-Capillary Latest Ref Range: 70 - 99 mg/dL 096 (H)  53 (L) 283 (H) 300 (H)

## 2020-09-29 NOTE — Progress Notes (Signed)
Recreation Therapy Notes  Animal-Assisted Activity (AAA) Program Checklist/Progress Notes Patient Eligibility Criteria Checklist & Daily Group note for Rec Tx Intervention  Date: 8.2.22 Time: 1430 Location: [address]  AAA/T Program Assumption of Risk Form signed by Engineer, production or Parent Legal Guardian YES  Patient is free of allergies or severe asthma YES  Patient reports no fear of animals YES  Patient reports no history of cruelty to animals YES   Patient understands his/her participation is voluntary YES  Patient washes hands before animal contact YES  Patient washes hands after animal contact YES  Behavioral Response: Engaged  Education: Charity fundraiser, Appropriate Animal Interaction   Education Outcome: Acknowledges understanding/In group clarification offered/Needs additional education.   Clinical Observations/Feedback:  Pt attended and participated in activity.     Caroll Rancher, LRT/CTRS        Caroll Rancher A 09/29/2020 3:47 PM

## 2020-09-29 NOTE — Progress Notes (Signed)
   09/29/20 1006  Vital Signs  Temp 98 F (36.7 C)  Temp Source Oral  Pulse Rate 70  BP 135/72  BP Location Left Arm  BP Method Automatic  Patient Position (if appropriate) Sitting  Oxygen Therapy  SpO2 100 %  O2 Device Room Air   D: Patient denies SI/HI/AVH. Pt. Rated anxiety 8/10 and depression as "always". Pt kept eyes closed at med window and talked in a low voice. Pt. Has pock marks that resemble pimples with white contents inside,  on both right and left arm. Dr. Has been informed. Pt. Stated that they are "old chickenpox scars" PT. Isolates in room.  A:  Patient took scheduled medicine.  Support and encouragement provided Routine safety checks conducted every 15 minutes. Patient  Informed to notify staff with any concerns.   R:  Safety maintained.

## 2020-09-29 NOTE — Progress Notes (Signed)
Psychoeducational Group Note  Date:  09/29/2020 Time:  0327  Group Topic/Focus:  Wrap-Up Group:   The focus of this group is to help patients review their daily goal of treatment and discuss progress on daily workbooks.  Participation Level: Did Not Attend  Participation Quality:  Not Applicable  Affect:  Not Applicable  Cognitive:  Not Applicable  Insight:  Not Applicable  Engagement in Group: Not Applicable  Additional Comments:  The patient did not attend group last evening.   Vander Kueker S 09/29/2020, 3:27 AM

## 2020-09-29 NOTE — Progress Notes (Signed)
Adult Psychoeducational Group Note  Date:  09/29/2020 Time:  7:21 PM  Group Topic/Focus:  Emotional Education:   The focus of this group is to discuss what feelings/emotions are, and how they are experienced.  Participation Level:  Active  Participation Quality:  Attentive  Affect:  Appropriate  Cognitive:  Appropriate  Insight: Appropriate  Engagement in Group:  Engaged  Modes of Intervention:  Discussion  Additional Comments:  Pt attended group but was not yet ready to share with the rest of the group.  Verdelle Valtierra R Theoden Mauch 09/29/2020, 7:21 PM

## 2020-09-29 NOTE — [facility] Counselor (Signed)
CSW provided the Pt with a packet that contained information for: housing and shelter resources, IRC information, free/reduced cost food options, GoodRX cards, and Suicide Prevention information.   

## 2020-09-29 NOTE — Progress Notes (Signed)
Psychoeducational Group Note  Date:  09/29/2020 Time:  2128  Group Topic/Focus:  Wrap-Up Group:   The focus of this group is to help patients review their daily goal of treatment and discuss progress on daily workbooks.  Participation Level: Did Not Attend  Participation Quality:  Not Applicable  Affect:  Not Applicable  Cognitive:  Not Applicable  Insight:  Not Applicable  Engagement in Group: Not Applicable  Additional Comments:  The patient attended group but did not share.   Hazle Coca S 09/29/2020, 9:28 PM

## 2020-09-30 LAB — GLUCOSE, CAPILLARY
Glucose-Capillary: 300 mg/dL — ABNORMAL HIGH (ref 70–99)
Glucose-Capillary: 167 mg/dL — ABNORMAL HIGH (ref 70–99)
Glucose-Capillary: 316 mg/dL — ABNORMAL HIGH (ref 70–99)
Glucose-Capillary: 109 mg/dL — ABNORMAL HIGH (ref 70–99)

## 2020-09-30 NOTE — Plan of Care (Signed)
Nurse discussed coping skills with patient.  

## 2020-09-30 NOTE — [facility] Counselor (Signed)
CSW spoke with the Pt about housing options and goals.  The Pt states that his goal is to be self-sufficient and to find and keep employment.  CSW reviewed the packet of resources with the Pt and discussed shelter options with him.  The Pt agreed to look through the packet more tonight and highlight places that he can call tomorrow and ask about available beds.  CSW will check in the with Pt to see if there has been any progress.

## 2020-09-30 NOTE — Progress Notes (Addendum)
[facility] MD Progress Note  09/30/2020 7:09 PM Boss Taylen Osorto  MRN:  [id number] Subjective:  [name] is a 35 y.o. adult, with reported past psychiatric history of MDD.  Past medical history of poorly controlled diabetes, HIV, hypertension.  Patient presents involuntarily to [facility] (09/25/2020), transferred from [facility] (09/24/2020) for SI daily for the last 5 to 10 years. [facility] stay day 5.   Last night:  Patient is compliant with scheduled meds.  Patient accepted vital checks, all medicines today.  PRNs: Trazadone for sleep  Today (09/30/2020): Patient was seen lying in bed, in the dark with the blankets over his head.  Patient's mood and affect are depressed and irritable.  Patient was more cooperative on interview today, although still irritable with increased questions. Patient slept Number of Hours: 6.25. Patient did not comments about his sleep. Patient stated that he was able to eat some.  Discussed with patient the dangers of his elevated blood glucose and elevated blood pressure, that we do not have the means here to control it if it were to increase further. Stated that if patient continued to refuse his insulin and glucose check, that we might have to send him to the ED for his safety.  Encouraged patient to increase fluid intake also for concern of mildly higher Cr level on labs. Patient said that he was able to have a bowel movement yesterday. Patient denied new blurriness or change in vision, dizziness, abdominal pain, nausea or vomiting, chest pain, shortness of breath. Patient denied AVH.   Patient did not directly answer whether or not he was having SI.  Rather stated that if he wanted to die, it would be up to him.   In regards to HI, patient's reported vaguely that maybe there is one individual that has been disrespectful to the patient.  Patient would not say whether this other person was patients or staff.  Patient stated that he just wants to be  respected.  Patient would not further explain on this.  Discussed with patient that in order to help him with any issue, he would need to be more frank and not with [place].    Principal Problem: MDD (major depressive disorder), recurrent severe, without psychosis (HCC) Diagnosis: Principal Problem:   MDD (major depressive disorder), recurrent severe, without psychosis (HCC) Active Problems:   Diabetes mellitus type 1 (HCC)   HIV infection (HCC)   Essential hypertension  Total Time spent with patient: 20 minutes  Past Psychiatric History:  Patient has had previous psychiatric admissions.  He is also had psychiatric evaluations when he has been admitted to the hospital for diabetic issues.  It does appear that he has been treated with several antidepressants in the past.  He has been on citalopram for at least 2 to 3 years.  He says it is never really been effective.  Past Medical History:  Past Medical History:  Diagnosis Date   Anxiety    Asthma    no prior hospitalizations, intubations   Depression    Headache    "only w/stress" (11/01/2017)   HIV (human immunodeficiency virus infection) (HCC)    Migraine    "a few/month" (11/01/2017)   Refusal of blood transfusions as patient is Jehovah's Witness    Seizure (HCC)    "only w/my low blood sugars"  (11/01/2017)   Type I diabetes mellitus (HCC)     Past Surgical History:  Procedure Laterality Date   FRACTURE SURGERY  IM NAILING TIBIA Right 11/01/2017   INTRAMEDULLARY (IM) NAIL TIBIALRightGeneral   ORIF ANKLE FRACTURE Right 11/01/2017   ORIF ANKLE FRACTURE Right 11/01/2017   Procedure: OPEN REDUCTION INTERNAL FIXATION (ORIF) ANKLE FRACTURE;  Surgeon: Roby Lofts, MD;  Location: [facility] OR;  Service: Orthopedics;  Laterality: Right;   TIBIA IM NAIL INSERTION Right 11/01/2017   Procedure: INTRAMEDULLARY (IM) NAIL TIBIAL;  Surgeon: Roby Lofts, MD;  Location: [facility] OR;  Service: Orthopedics;  Laterality: Right;   Family History:  Family  History  Problem Relation Age of Onset   Thyroid disease Mother    Diabetes Mother    Breast cancer Maternal Grandmother    Diabetes Maternal Grandmother    Family Psychiatric  History:  Patient was unsure about his father, but did state that he felt as though his mother had been treated for psychiatric illness in the past.  His father was killed by a secondary relative when he was very young.  Social History:  Social History   Substance and Sexual Activity  Alcohol Use Yes   Alcohol/week: 7.0 standard drinks   Types: 7 Cans of beer per week     Social History   Substance and Sexual Activity  Drug Use Not Currently   Frequency: 2.0 times per week   Types: Marijuana   Comment: 11/01/2017  "weekly"    Social History   Socioeconomic History   Marital status: Single    Spouse name: Not on file   Number of children: Not on file   Years of education: Not on file   Highest education level: Not on file  Occupational History   Not on file  Tobacco Use   Smoking status: Every Day    Types: Cigars    Start date: 09/29/2015   Smokeless tobacco: Never   Tobacco comments:    11/01/2017  "2 cigarettes in ~ 6 month "  Vaping Use   Vaping Use: Never used  Substance and Sexual Activity   Alcohol use: Yes    Alcohol/week: 7.0 standard drinks    Types: 7 Cans of beer per week   Drug use: Not Currently    Frequency: 2.0 times per week    Types: Marijuana    Comment: 11/01/2017  "weekly"   Sexual activity: Yes    Partners: Male    Comment: pt. declined condoms  Other Topics Concern   Not on file  Social History Narrative   Not on file   Social Determinants of Health   Financial Resource Strain: Not on file  Food Insecurity: Not on file  Transportation Needs: Not on file  Physical Activity: Not on file  Stress: Not on file  Social Connections: Not on file   Additional Social History:  Specify valuables returned:  (clothing, toiletries, misc items)                       Sleep: Fair  Appetite:  Poor  Current Medications: Current Facility-Administered Medications  Medication Dose Route Frequency Provider Last Rate Last Admin   acetaminophen (TYLENOL) tablet 650 mg  650 mg Oral Q6H PRN Bobbitt, Shalon E, NP       alum & mag hydroxide-simeth (MAALOX/MYLANTA) 200-200-20 MG/5ML suspension 30 mL  30 mL Oral Q4H PRN Bobbitt, Shalon E, NP       amLODipine (NORVASC) tablet 10 mg  10 mg Oral Daily Antonieta Pert, MD   10 mg at 09/30/20 1001   cloNIDine (CATAPRES) tablet 0.1  mg  0.1 mg Oral Q8H PRN Comer Locket, MD       elvitegravir-cobicistat-emtricitabine-tenofovir (GENVOYA) 150-150-200-10 MG tablet 1 tablet  1 tablet Oral Q breakfast Bobbitt, Shalon E, NP   1 tablet at 09/30/20 1001   feeding supplement (GLUCERNA SHAKE) (GLUCERNA SHAKE) liquid 237 mL  237 mL Oral TID BM Antonieta Pert, MD   237 mL at 09/30/20 1500   insulin aspart (novoLOG) injection 0-5 Units  0-5 Units Subcutaneous QHS Bobbitt, Shalon E, NP   4 Units at 09/28/20 2208   insulin aspart (novoLOG) injection 0-6 Units  0-6 Units Subcutaneous TID WC Antonieta Pert, MD   4 Units at 09/30/20 1737   insulin aspart (novoLOG) injection 5 Units  5 Units Subcutaneous TID WC Antonieta Pert, MD   5 Units at 09/30/20 1739   insulin glargine (LANTUS) injection 15 Units  15 Units Subcutaneous QHS Antonieta Pert, MD   15 Units at 09/30/20 0111   magnesium hydroxide (MILK OF MAGNESIA) suspension 30 mL  30 mL Oral Daily PRN Bobbitt, Shalon E, NP       ondansetron (ZOFRAN-ODT) disintegrating tablet 8 mg  8 mg Oral Q8H PRN Antonieta Pert, MD       sertraline (ZOLOFT) tablet 50 mg  50 mg Oral Daily Princess Bruins, DO   50 mg at 09/30/20 1001   traZODone (DESYREL) tablet 50 mg  50 mg Oral QHS PRN Bobbitt, Shalon E, NP   50 mg at 09/29/20 2055    Lab Results:  Results for orders placed or performed during the hospital encounter of 09/25/20 (from the past 48 hour(s))  Glucose, capillary      Status: Abnormal   Collection Time: 09/28/20  7:56 PM  Result Value Ref Range   Glucose-Capillary 373 (H) 70 - 99 mg/dL    Comment: Glucose reference range applies only to samples taken after fasting for at least 8 hours.  Glucose, capillary     Status: None   Collection Time: 09/29/20  6:21 AM  Result Value Ref Range   Glucose-Capillary 82 70 - 99 mg/dL    Comment: Glucose reference range applies only to samples taken after fasting for at least 8 hours.  Glucose, capillary     Status: Abnormal   Collection Time: 09/29/20 11:47 AM  Result Value Ref Range   Glucose-Capillary 382 (H) 70 - 99 mg/dL    Comment: Glucose reference range applies only to samples taken after fasting for at least 8 hours.   Comment 1 Notify RN   Glucose, capillary     Status: Abnormal   Collection Time: 09/29/20  5:13 PM  Result Value Ref Range   Glucose-Capillary 148 (H) 70 - 99 mg/dL    Comment: Glucose reference range applies only to samples taken after fasting for at least 8 hours.  Basic metabolic panel     Status: Abnormal   Collection Time: 09/29/20  6:53 PM  Result Value Ref Range   Sodium 135 135 - 145 mmol/L   Potassium 4.6 3.5 - 5.1 mmol/L   Chloride 101 98 - 111 mmol/L   CO2 26 22 - 32 mmol/L   Glucose, Bld 203 (H) 70 - 99 mg/dL    Comment: Glucose reference range applies only to samples taken after fasting for at least 8 hours.   BUN 23 (H) 6 - 20 mg/dL   Creatinine, Ser 0.10 (H) 0.61 - 1.24 mg/dL   Calcium 9.7 8.9 - 27.2 mg/dL  GFR, Estimated >60 >60 mL/min    Comment: (NOTE) Calculated using the CKD-EPI Creatinine Equation (2021)    Anion gap 8 5 - 15    Comment: Performed at [facility], [address] [place], [place] [id number]  Glucose, capillary     Status: Abnormal   Collection Time: 09/29/20  7:57 PM  Result Value Ref Range   Glucose-Capillary 53 (L) 70 - 99 mg/dL    Comment: Glucose reference range applies only to samples taken after fasting for at least 8  hours.  Glucose, capillary     Status: Abnormal   Collection Time: 09/29/20 10:31 PM  Result Value Ref Range   Glucose-Capillary 292 (H) 70 - 99 mg/dL    Comment: Glucose reference range applies only to samples taken after fasting for at least 8 hours.  Glucose, capillary     Status: Abnormal   Collection Time: 09/30/20  5:57 AM  Result Value Ref Range   Glucose-Capillary 300 (H) 70 - 99 mg/dL    Comment: Glucose reference range applies only to samples taken after fasting for at least 8 hours.   Comment 1 Notify RN   Glucose, capillary     Status: Abnormal   Collection Time: 09/30/20 11:57 AM  Result Value Ref Range   Glucose-Capillary 167 (H) 70 - 99 mg/dL    Comment: Glucose reference range applies only to samples taken after fasting for at least 8 hours.   Comment 1 Notify RN    Comment 2 Document in Chart   Glucose, capillary     Status: Abnormal   Collection Time: 09/30/20  5:05 PM  Result Value Ref Range   Glucose-Capillary 316 (H) 70 - 99 mg/dL    Comment: Glucose reference range applies only to samples taken after fasting for at least 8 hours.    Blood Alcohol level:  Lab Results  Component Value Date   ETH <10 09/24/2020   ETH <10 02/09/2020    Metabolic Disorder Labs: Lab Results  Component Value Date   HGBA1C 9.7 (H) 09/09/2020   MPG 231.69 09/09/2020   MPG 205.86 02/11/2020   Lab Results  Component Value Date   PROLACTIN 6.0 09/24/2020   Lab Results  Component Value Date   CHOL 187 09/24/2020   TRIG 50 09/24/2020   HDL 100 09/24/2020   CHOLHDL 1.9 09/24/2020   VLDL 10 09/24/2020   LDLCALC 77 09/24/2020   LDLCALC 64 11/22/2019    Physical Findings: AIMS: Facial and Oral Movements Muscles of Facial Expression: None, normal Lips and Perioral Area: None, normal Jaw: None, normal Tongue: None, normal,Extremity Movements Upper (arms, wrists, hands, fingers): None, normal Lower (legs, knees, ankles, toes): None, normal, Trunk Movements Neck,  shoulders, hips: None, normal, Overall Severity Severity of abnormal movements (highest score from questions above): None, normal Incapacitation due to abnormal movements: None, normal Patient's awareness of abnormal movements (rate only patient's report): No Awareness, Dental Status Current problems with teeth and/or dentures?: Yes Does patient usually wear dentures?: Yes  CIWA:    COWS:  COWS Total Score: 4  Musculoskeletal: Strength & Muscle Tone: within normal limits Gait & Station: normal Patient leans: N/A  Psychiatric Specialty Exam:  Presentation  General Appearance: Fairly Groomed, casually dressed  Eye Contact:Minimal (Patient mainly looked at the floor during interview)  Speech:Clear and Coherent; Normal Rate  Speech Volume:Normal  Handedness:Right   Mood and Affect  Mood:Dysphoric; Depressed; Irritable; Hopeless  Affect: Depressed, irritable   Thought Process  Thought  Processes:Coherent; Linear; Goal Directed  Descriptions of Associations:Intact  Orientation:Full (Time, Place and Person)  Thought Content: Rumination. Denies AVH, paranoia or delusions and no acute psychosis on exam  History of Schizophrenia/Schizoaffective disorder:No  Duration of Psychotic Symptoms:No data recorded Hallucinations:Hallucinations: None Ideas of Reference:None  Suicidal Thoughts:Suicidal Thoughts: Yes, Passive  Homicidal Thoughts:Homicidal Thoughts: No  Sensorium  Memory:Immediate Fair; Recent Fair; Remote Fair  Judgment:Poor  Insight:Poor   Executive Functions  Concentration:Fair  Attention Span:Fair  Recall:Fair  Fund of Knowledge:Fair  Language:Good   Psychomotor Activity  Psychomotor Activity: Psychomotor Activity: Normal  Assets  Assets:Communication Skills; Resilience   Sleep  Sleep: Sleep: Fair Number of Hours of Sleep: 6.75   Physical Exam: Physical Exam Vitals and nursing note reviewed.  HENT:     Head: Normocephalic and  atraumatic.  Pulmonary:     Effort: Pulmonary effort is normal.  Skin:    General: Skin is warm and dry.     Comments: Multiple areas of healing scars on arms bilaterally which he states are from old chicken pox scaring - no acute pustules, erythema, or induration noted.   Neurological:     Mental Status: He is alert and oriented to person, place, and time.   Review of Systems  Constitutional:  Negative for fever.  Eyes:  Positive for blurred vision (Chronic with floaters, likely due to diabetic retinopathy).  Respiratory:  Negative for shortness of breath.   Cardiovascular:  Negative for chest pain.  Gastrointestinal:  Negative for abdominal pain, constipation, nausea and vomiting.  Musculoskeletal:  Positive for joint pain (Right ankle chronic, dull and burning since surgery).  Neurological:  Negative for dizziness.  Blood pressure (!) 145/94, pulse 89, temperature 98.3 F (36.8 C), temperature source Oral, resp. rate 18, height 6\' 1"  (1.854 m), weight 93 kg, SpO2 95 %. Body mass index is 27.05 kg/m.   Treatment Plan Summary: Daily contact with patient to assess and evaluate symptoms and progress in treatment and Medication management  [name] is a 35 y.o. adult, with reported past psychiatric history of MDD.  Past medical history of poorly controlled diabetes, HIV, hypertension.  Patient presents involuntarily to [facility] (09/25/2020), transferred from [facility] (09/24/2020) for SI daily for the last 5 to 10 years. [facility] stay day 5.   PSYCHIATRIC DIAGNOSIS & TREATMENT:  MDD - Continue Zoloft 50 mg p.o. daily - Continue Glucerna shake liquid 237 mL p.o. 3 times daily between meals for decreased appetite due to depression    MEDICAL MANAGEMENT: Hypertension - Continue amlodipine 10 mg p.o. daily  Diabetes - Continue sliding scale  Lantus 15 units IM nightly  NovoLog 5 units a.m. 3 times daily with meal  HIV - Continue Genvoya 1 tablet daily  with breakfast  Elevated Creatinine (1.50) - Encouraging po hydration and recheck BMP today shows creatinine down to 1.41 - recheck tomorrow for trending  Hyponatremia (Na+ 133) and Hyperkalemia (K+ 5.3) on admission- resolved - Recheck BMP shows Na+ 135 and K+ 4.6 on 09/29/20 - EKG pending  PRN's -Trazodone 50mg  PO qHS PRN for insomnia -Alum & mag hydroxide-simeth 74ml PO qHS PRN for GERD -Magnesium hydroxide 36ml PO daily PRN for constipation -Acetaminophen tablet 650mg  PO PRN q6hrs for mild pain  -Clonidine 0.1 mg p.o. 3 times daily as needed for SBP >150 -Zofran 8 mg p.o. every 8 hours as needed for nausea and vomiting  Discharge Planning: -Social work and case management to assist with discharge planning and identification of hospital follow-up  needs prior to discharge -Estimated LOS: 3-4 days -Discharge Concerns: Need to establish a safety plan; Medication compliance and effectiveness -Discharge Goals: Return home with outpatient referrals for mental health follow-up including medication management/psychotherapy  Safety and Monitoring: -Involuntary admission to inpatient psychiatric unit for safety, stabilization and treatment -Daily contact with patient to assess and evaluate symptoms and progress in treatment -Patient's case to be discussed in multi-disciplinary team meeting -Observation Level : q15 minute checks -Vital signs: q12 hours -Precautions: suicide, elopement, and assault   Princess Bruins, DO, PGY-1 09/30/2020, 7:09 PM

## 2020-09-30 NOTE — Progress Notes (Addendum)
D:  Patient denied SI and HI, contracts for safety.  Denied A/V hallucinations.  Denied pain. A:  Medications administered per MD orders.  Emotional support and encouragement given patient. R:  Safety maintained with 15 minute checks.  

## 2020-09-30 NOTE — Progress Notes (Addendum)
Pt informed writer that pt took a pair of scissors from the crash cart in the nursing station on Saturday. Pt said he had the scissors until he gave them to staff Wednesday 8/3 during the day. Pt said he did not take them for any bad reasons, he just wanted to show that if there is no one watching the patients and no one staying at the nursing station patients have access to a lot of dangerous things. Pt stated there was a lot of time where no one was at the nursing station and not really paying attention to the patient or his peers. Pt informed Clinical research associate that he was observant of other harmful things at the nursing station in drawers that  him and other patients noticed. Writer thanked pt for bringing up this information in hopes of helping to create a safer environment for staff and patients.

## 2020-09-30 NOTE — Progress Notes (Signed)
Recreation Therapy Notes  Date: 8.3.22 Time: 0930 Location: 300 [address] Dayroom  Group Topic: Stress Management   Goal Area(s) Addresses:  Patient will actively participate in stress management techniques presented during session.  Patient will successfully identify benefit of practicing stress management post d/c.   Intervention: Guided exercise with ambient sound and script  Activity :Guided Imagery  LRT read a script that focused on finding peace and relaxation while envisioning being on the beach at sunrise. Patients were given suggestions of ways to access scripts post d/c and encouraged to explore Youtube and other apps available on smartphones, tablets, and computers.   Education:  Stress Management, Discharge Planning.   Education Outcome: Acknowledges education  Clinical Observations/Feedback: Patient did not attend group session.     Yona Stansbury, LRT/CTRS         Duanna Runk A 09/30/2020 11:51 AM 

## 2020-09-30 NOTE — [facility]HH Group Notes (Signed)
Type of Therapy and Topic:  Group Therapy:  Strengths Exploration   Participation Level: Did not attend   Description of Group: This group allows individuals to explore their strengths, learn to use strengths in new ways to improve well-being. Strengths-based interventions involve identifying strengths, understanding how they are used, and learning new ways to apply them. Individuals will identify their strengths, and then explore their roles in different areas of life (relationships, professional life, and personal fulfillment). Individuals will think about ways in which they currently use their strengths, along with new ways they could begin using them.    Therapeutic Goals Patient will verbalize two of their strengths Patient will identify how their strengths are currently used Patient will identify two new ways to apply their strengths  Patients will create a plan to apply their strengths in their daily lives     Summary of Patient Progress:  Did not attend

## 2020-09-30 NOTE — Progress Notes (Signed)
Adult Psychoeducational Group Note  Date:  09/30/2020 Time:  5:19 PM  Group Topic/Focus:  Goals Group:   The focus of this group is to help patients establish daily goals to achieve during treatment and discuss how the patient can incorporate goal setting into their daily lives to aide in recovery.  Participation Level:  Active  Participation Quality:  Appropriate and Attentive  Affect:  Appropriate  Cognitive:  Alert and Appropriate  Insight: Appropriate and Good  Engagement in Group:  Engaged  Modes of Intervention:  Activity  Additional Comments:  Pt has a goal today to "stay out of trouble." Writer and Pt spoke at length about good goal setting criteria, establishing personal values, as well as helping him ask for what he needs with his treatment team.   Deforest Hoyles Erlean Mealor 09/30/2020, 5:19 PM

## 2020-09-30 NOTE — Progress Notes (Signed)
The patient rated his day as a 5 out of 10 due to having trust issues with the staff. His goal for tomorrow is to have a better understanding of things with his illness.

## 2020-09-30 NOTE — Progress Notes (Signed)
Pt visible on the unit this evening. Pt stated he was doing better. Pt given PRN Trazodone per [facility]     09/30/20 2300  Psych Admission Type (Psych Patients Only)  Admission Status Involuntary  Psychosocial Assessment  Patient Complaints Anxiety;Depression  Eye Contact Avoids  Facial Expression Flat  Affect Depressed;Angry  Speech Slow  Interaction Avoidant  Motor Activity Slow  Appearance/Hygiene Unremarkable  Behavior Characteristics Anxious  Mood Depressed  Thought Process  Coherency WDL (nonverbal at the moment)  Content WDL  Delusions None reported or observed  Perception WDL  Hallucination None reported or observed  Judgment Poor  Confusion WDL  Danger to Self  Current suicidal ideation? Denies  Self-Injurious Behavior No self-injurious ideation or behavior indicators observed or expressed   Agreement Not to Harm Self Yes  Description of Agreement verbal contract for safety  Danger to Others  Danger to Others None reported or observed

## 2020-10-01 LAB — GLUCOSE, CAPILLARY
Glucose-Capillary: 143 mg/dL — ABNORMAL HIGH (ref 70–99)
Glucose-Capillary: 307 mg/dL — ABNORMAL HIGH (ref 70–99)
Glucose-Capillary: 178 mg/dL — ABNORMAL HIGH (ref 70–99)
Glucose-Capillary: 110 mg/dL — ABNORMAL HIGH (ref 70–99)

## 2020-10-01 LAB — BASIC METABOLIC PANEL
Anion gap: 10 (ref 5–15)
BUN: 22 mg/dL — ABNORMAL HIGH (ref 6–20)
CO2: 23 mmol/L (ref 22–32)
Calcium: 9.7 mg/dL (ref 8.9–10.3)
Chloride: 104 mmol/L (ref 98–111)
Creatinine, Ser: 1.23 mg/dL (ref 0.61–1.24)
GFR, Estimated: >60 mL/min (ref >60)
Glucose, Bld: 149 mg/dL — ABNORMAL HIGH (ref 70–99)
Potassium: 4.2 mmol/L (ref 3.5–5.1)
Sodium: 137 mmol/L (ref 135–145)

## 2020-10-01 MED ORDER — OXCARBAZEPINE 150 MG PO TABS
150.0000 mg | ORAL_TABLET | Freq: Two times a day (BID) | ORAL | Status: DC
  Administered 2020-10-01 – 2020-10-05 (×8): 150 mg via ORAL
  Filled 2020-10-01 (×13): qty 1

## 2020-10-01 NOTE — Plan of Care (Signed)
Patient is pleasant and active in the milieu. Sad and depressed but pleasant on approach. Expressing housing concerns: currently homeless. Patient has been making phone calls, trying to find housing. Denying SI/HI. Taking medications and attending groups. Emotional support provided and safety maintained.

## 2020-10-01 NOTE — BHH Counselor (Signed)
CSW spoke with the Pt who states that he has been calling shelters and [place] houses and has found one shelter at [facility] and an [facility] on [address], [place], [place] [phone] that will accept him at discharge.  The Pt states that he will be calling a few more places so he can look at his options and will let a CSW know tomorrow (10/02/20) which shelter or [facility] he has chosen.

## 2020-10-01 NOTE — Progress Notes (Addendum)
[facility] MD Progress Note  10/01/2020 3:55 PM [name]  MRN:  [id number] Subjective:  [name]  is a 35 y.o. adult, with reported past psychiatric history of MDD.  Past medical history of poorly controlled diabetes, HIV, hypertension.  Patient presents involuntarily to [facility] (09/25/2020), transferred from [facility] (09/24/2020) for SI daily for the last 5 to 10 years. [facility] stay day 6.   Last night:  Patient is compliant with scheduled meds.  Patient accepted vital checks, all medicines today.  PRNs: Trazadone for sleep Per RN notes, patient returned a pair of scissors that he took from the nursing station on Saturday.  Today (10/01/2020): Patient was seen in group.  Patient's mood and affect are still irritable, but less depressed.  Patient has continued to improve with cooperation on interview and is more talkative today.  Patient did state that he feels like his mood has improved since admission. Patient slept Number of Hours: 6.75.  In regards to the events with the scissors last night, patient admitted that it was not appropriate behavior and stated that he will not do that again.  Patient stated that the reason for taking the scissors was for his comfort and they gave him a sense of personal safety while on the unit.  Patient said that he was not planning on using it for self harm or harming others. Patient voiced the concern that he does not feel heard and disrespected from certain staff members.  Patient stated that this was the reason for his initial noncompliance to medication and vitals check and glucose capillary checks. Patient also admitted to increasing feelings of agitation when he sees mistreatment of people.  Stated that he has been suppressing that feeling, and that he does not want his agitation to escalate. Discussed with patient possible addition of BuSpar or Trileptal for mood stability or increasing irritation, patient was amenable to  additional medication.  Discussed with patient the risk and benefits and possible side effects of each. Continued to encouraged patient to increase fluid intake, and reported to patient that his Cr is improving. Patient denied SI/HI/AVH, delusions, paranoia, first rank symptoms.   Principal Problem: MDD (major depressive disorder), recurrent severe, without psychosis (HCC) Diagnosis: Principal Problem:   MDD (major depressive disorder), recurrent severe, without psychosis (HCC) Active Problems:   Diabetes mellitus type 1 (HCC)   HIV infection (HCC)   Essential hypertension  Total Time spent with patient: 20 minutes  Past Psychiatric History:  Patient has had previous psychiatric admissions.  He is also had psychiatric evaluations when he has been admitted to the hospital for diabetic issues.  It does appear that he has been treated with several antidepressants in the past.  He has been on citalopram for at least 2 to 3 years.  He says it is never really been effective.  Past Medical History:  Past Medical History:  Diagnosis Date   Anxiety    Asthma    no prior hospitalizations, intubations   Depression    Headache    "only w/stress" (11/01/2017)   HIV (human immunodeficiency virus infection) (HCC)    Migraine    "a few/month" (11/01/2017)   Refusal of blood transfusions as patient is Jehovah's Witness    Seizure (HCC)    "only w/my low blood sugars"  (11/01/2017)   Type I diabetes mellitus (HCC)     Past Surgical History:  Procedure Laterality Date   FRACTURE SURGERY     IM NAILING TIBIA Right  11/01/2017   INTRAMEDULLARY (IM) NAIL TIBIALRightGeneral   ORIF ANKLE FRACTURE Right 11/01/2017   ORIF ANKLE FRACTURE Right 11/01/2017   Procedure: OPEN REDUCTION INTERNAL FIXATION (ORIF) ANKLE FRACTURE;  Surgeon: Roby Lofts, MD;  Location: [facility] OR;  Service: Orthopedics;  Laterality: Right;   TIBIA IM NAIL INSERTION Right 11/01/2017   Procedure: INTRAMEDULLARY (IM) NAIL TIBIAL;  Surgeon:  Roby Lofts, MD;  Location: [facility] OR;  Service: Orthopedics;  Laterality: Right;   Family History:  Family History  Problem Relation Age of Onset   Thyroid disease Mother    Diabetes Mother    Breast cancer Maternal Grandmother    Diabetes Maternal Grandmother    Family Psychiatric  History:  Patient was unsure about his father, but did state that he felt as though his mother had been treated for psychiatric illness in the past.  His father was killed by a secondary relative when he was very young.  Social History:  Social History   Substance and Sexual Activity  Alcohol Use Yes   Alcohol/week: 7.0 standard drinks   Types: 7 Cans of beer per week     Social History   Substance and Sexual Activity  Drug Use Not Currently   Frequency: 2.0 times per week   Types: Marijuana   Comment: 11/01/2017  "weekly"    Social History   Socioeconomic History   Marital status: Single    Spouse name: Not on file   Number of children: Not on file   Years of education: Not on file   Highest education level: Not on file  Occupational History   Not on file  Tobacco Use   Smoking status: Every Day    Types: Cigars    Start date: 09/29/2015   Smokeless tobacco: Never   Tobacco comments:    11/01/2017  "2 cigarettes in ~ 6 month "  Vaping Use   Vaping Use: Never used  Substance and Sexual Activity   Alcohol use: Yes    Alcohol/week: 7.0 standard drinks    Types: 7 Cans of beer per week   Drug use: Not Currently    Frequency: 2.0 times per week    Types: Marijuana    Comment: 11/01/2017  "weekly"   Sexual activity: Yes    Partners: Male    Comment: pt. declined condoms  Other Topics Concern   Not on file  Social History Narrative   Not on file   Social Determinants of Health   Financial Resource Strain: Not on file  Food Insecurity: Not on file  Transportation Needs: Not on file  Physical Activity: Not on file  Stress: Not on file  Social Connections: Not on file   Additional  Social History:  Specify valuables returned:  (clothing, toiletries, misc items)                      Sleep: Fair  Appetite:  Fair  Current Medications: Current Facility-Administered Medications  Medication Dose Route Frequency Provider Last Rate Last Admin   acetaminophen (TYLENOL) tablet 650 mg  650 mg Oral Q6H PRN Bobbitt, Shalon E, NP       alum & mag hydroxide-simeth (MAALOX/MYLANTA) 200-200-20 MG/5ML suspension 30 mL  30 mL Oral Q4H PRN Bobbitt, Shalon E, NP       amLODipine (NORVASC) tablet 10 mg  10 mg Oral Daily Antonieta Pert, MD   10 mg at 10/01/20 1050   cloNIDine (CATAPRES) tablet 0.1 mg  0.1 mg  Oral Q8H PRN Comer Locket, MD   0.1 mg at 10/01/20 1531   elvitegravir-cobicistat-emtricitabine-tenofovir (GENVOYA) 150-150-200-10 MG tablet 1 tablet  1 tablet Oral Q breakfast Bobbitt, Shalon E, NP   1 tablet at 10/01/20 1049   feeding supplement (GLUCERNA SHAKE) (GLUCERNA SHAKE) liquid 237 mL  237 mL Oral TID BM Antonieta Pert, MD   237 mL at 10/01/20 1340   insulin aspart (novoLOG) injection 0-5 Units  0-5 Units Subcutaneous QHS Bobbitt, Shalon E, NP   4 Units at 09/28/20 2208   insulin aspart (novoLOG) injection 0-6 Units  0-6 Units Subcutaneous TID WC Antonieta Pert, MD   4 Units at 10/01/20 1210   insulin aspart (novoLOG) injection 5 Units  5 Units Subcutaneous TID WC Antonieta Pert, MD   5 Units at 10/01/20 1209   insulin glargine (LANTUS) injection 15 Units  15 Units Subcutaneous QHS Antonieta Pert, MD   15 Units at 09/30/20 2151   magnesium hydroxide (MILK OF MAGNESIA) suspension 30 mL  30 mL Oral Daily PRN Bobbitt, Shalon E, NP       ondansetron (ZOFRAN-ODT) disintegrating tablet 8 mg  8 mg Oral Q8H PRN Antonieta Pert, MD       sertraline (ZOLOFT) tablet 50 mg  50 mg Oral Daily Princess Bruins, DO   50 mg at 10/01/20 1050   traZODone (DESYREL) tablet 50 mg  50 mg Oral QHS PRN Bobbitt, Shalon E, NP   50 mg at 09/30/20 2150    Lab Results:   Results for orders placed or performed during the hospital encounter of 09/25/20 (from the past 48 hour(s))  Glucose, capillary     Status: Abnormal   Collection Time: 09/29/20  5:13 PM  Result Value Ref Range   Glucose-Capillary 148 (H) 70 - 99 mg/dL    Comment: Glucose reference range applies only to samples taken after fasting for at least 8 hours.  Basic metabolic panel     Status: Abnormal   Collection Time: 09/29/20  6:53 PM  Result Value Ref Range   Sodium 135 135 - 145 mmol/L   Potassium 4.6 3.5 - 5.1 mmol/L   Chloride 101 98 - 111 mmol/L   CO2 26 22 - 32 mmol/L   Glucose, Bld 203 (H) 70 - 99 mg/dL    Comment: Glucose reference range applies only to samples taken after fasting for at least 8 hours.   BUN 23 (H) 6 - 20 mg/dL   Creatinine, Ser 5.63 (H) 0.61 - 1.24 mg/dL   Calcium 9.7 8.9 - 87.5 mg/dL   GFR, Estimated >64 >33 mL/min    Comment: (NOTE) Calculated using the CKD-EPI Creatinine Equation (2021)    Anion gap 8 5 - 15    Comment: Performed at [facility] [address] [place], [place] [address]  Glucose, capillary     Status: Abnormal   Collection Time: 09/29/20  7:57 PM  Result Value Ref Range   Glucose-Capillary 53 (L) 70 - 99 mg/dL    Comment: Glucose reference range applies only to samples taken after fasting for at least 8 hours.  Glucose, capillary     Status: Abnormal   Collection Time: 09/29/20 10:31 PM  Result Value Ref Range   Glucose-Capillary 292 (H) 70 - 99 mg/dL    Comment: Glucose reference range applies only to samples taken after fasting for at least 8 hours.  Glucose, capillary     Status: Abnormal   Collection Time:  09/30/20  5:57 AM  Result Value Ref Range   Glucose-Capillary 300 (H) 70 - 99 mg/dL    Comment: Glucose reference range applies only to samples taken after fasting for at least 8 hours.   Comment 1 Notify RN   Glucose, capillary     Status: Abnormal   Collection Time: 09/30/20 11:57 AM  Result Value Ref  Range   Glucose-Capillary 167 (H) 70 - 99 mg/dL    Comment: Glucose reference range applies only to samples taken after fasting for at least 8 hours.   Comment 1 Notify RN    Comment 2 Document in Chart   Glucose, capillary     Status: Abnormal   Collection Time: 09/30/20  5:05 PM  Result Value Ref Range   Glucose-Capillary 316 (H) 70 - 99 mg/dL    Comment: Glucose reference range applies only to samples taken after fasting for at least 8 hours.  Glucose, capillary     Status: Abnormal   Collection Time: 09/30/20  9:12 PM  Result Value Ref Range   Glucose-Capillary 109 (H) 70 - 99 mg/dL    Comment: Glucose reference range applies only to samples taken after fasting for at least 8 hours.   Comment 1 Notify RN    Comment 2 Document in Chart   Glucose, capillary     Status: Abnormal   Collection Time: 10/01/20  6:14 AM  Result Value Ref Range   Glucose-Capillary 143 (H) 70 - 99 mg/dL    Comment: Glucose reference range applies only to samples taken after fasting for at least 8 hours.  Basic metabolic panel     Status: Abnormal   Collection Time: 10/01/20  6:31 AM  Result Value Ref Range   Sodium 137 135 - 145 mmol/L   Potassium 4.2 3.5 - 5.1 mmol/L   Chloride 104 98 - 111 mmol/L   CO2 23 22 - 32 mmol/L   Glucose, Bld 149 (H) 70 - 99 mg/dL    Comment: Glucose reference range applies only to samples taken after fasting for at least 8 hours.   BUN 22 (H) 6 - 20 mg/dL   Creatinine, Ser 4.09 0.61 - 1.24 mg/dL   Calcium 9.7 8.9 - 81.1 mg/dL   GFR, Estimated >91 >47 mL/min    Comment: (NOTE) Calculated using the CKD-EPI Creatinine Equation (2021)    Anion gap 10 5 - 15    Comment: Performed at [facility], [address] [place], [place] [address]  Glucose, capillary     Status: Abnormal   Collection Time: 10/01/20 11:59 AM  Result Value Ref Range   Glucose-Capillary 307 (H) 70 - 99 mg/dL    Comment: Glucose reference range applies only to samples taken after  fasting for at least 8 hours.    Blood Alcohol level:  Lab Results  Component Value Date   ETH <10 09/24/2020   ETH <10 02/09/2020    Metabolic Disorder Labs: Lab Results  Component Value Date   HGBA1C 9.7 (H) 09/09/2020   MPG 231.69 09/09/2020   MPG 205.86 02/11/2020   Lab Results  Component Value Date   PROLACTIN 6.0 09/24/2020   Lab Results  Component Value Date   CHOL 187 09/24/2020   TRIG 50 09/24/2020   HDL 100 09/24/2020   CHOLHDL 1.9 09/24/2020   VLDL 10 09/24/2020   LDLCALC 77 09/24/2020   LDLCALC 64 11/22/2019    Physical Findings:  Musculoskeletal: Strength & Muscle Tone: within normal limits  Gait & Station: normal Patient leans: N/A  Psychiatric Specialty Exam:  Presentation  General Appearance: Appropriate for Environment; Casual; Fairly Groomed  Eye Contact:Fair  Speech:Clear and Coherent; Normal Rate  Speech Volume:Normal  Handedness:Right   Mood and Affect  Mood:Dysphoric; Irritable  Affect: Congruent   Thought Process  Thought Processes:Coherent; Goal Directed; Linear   Descriptions of Associations:Intact  Orientation:Full (Time, Place and Person)  Thought Content: Logical, denied SI/HI/AVH, paranoia, delusions, first rank symptoms.  Patient did not display symptoms of psychosis or delusions during interview.  History of Schizophrenia/Schizoaffective disorder:No  Duration of Psychotic Symptoms: None Hallucinations:Hallucinations: None Ideas of Reference:None  Suicidal Thoughts:Suicidal Thoughts: No  Homicidal Thoughts:Homicidal Thoughts: No  Sensorium  Memory:Remote Fair; Immediate Good; Recent Good  Judgment:Poor  Insight:Fair   Executive Functions  Concentration:Good  Attention Span:Good  Recall:Good  Fund of Knowledge:Fair  Language:Good   Psychomotor Activity  Psychomotor Activity: Psychomotor Activity: Normal  Assets  Assets:Communication Skills; Desire for Improvement; Resilience   Sleep   Sleep: Sleep: Fair Number of Hours of Sleep: 6.75   Physical Exam: Physical Exam Vitals and nursing note reviewed.  HENT:     Head: Normocephalic and atraumatic.  Pulmonary:     Effort: Pulmonary effort is normal.  Skin:    Comments: Multiple areas of healing scars on arms bilaterally which he states are from old chicken pox scaring - no acute pustules, erythema, or induration noted.   Neurological:     Mental Status: He is alert and oriented to person, place, and time.   Review of Systems  Constitutional:  Negative for fever.  Eyes:  Positive for blurred vision (Chronic with floaters, likely due to diabetic retinopathy).  Respiratory:  Negative for shortness of breath.   Cardiovascular:  Negative for chest pain.  Gastrointestinal:  Negative for abdominal pain, constipation, nausea and vomiting.  Musculoskeletal:  Positive for joint pain (Right ankle chronic, dull and burning since surgery).  Neurological:  Negative for dizziness.  Blood pressure (!) 135/101, pulse 88, temperature 98.1 F (36.7 C), temperature source Oral, resp. rate 16, height  (1.854 m), weight 93 kg, SpO2 100 %. Body mass index is 27.05 kg/m.   Treatment Plan Summary: Daily contact with patient to assess and evaluate symptoms and progress in treatment and Medication management  [name] is a 34 y.o. adult, with reported past psychiatric history of MDD.  Past medical history of poorly controlled diabetes, HIV, hypertension.  Patient presents involuntarily to [facility] (09/25/2020), transferred from [facility] (09/24/2020) for SI daily for the last 5 to 10 years. [facility] stay day 6.   PSYCHIATRIC DIAGNOSIS & TREATMENT:  MDD - Continue Zoloft 50 mg p.o. daily - Continue Glucerna shake liquid 237 mL p.o. 3 times daily between meals for decreased appetite due to depression - Start Trileptal 150 mg p.o. twice daily for mood stability and impulse control (monitoring Na+ with start of  medication) r/b/se/a to med discussed with patient     MEDICAL MANAGEMENT: Hypertension - Continue amlodipine 10 mg p.o. daily  Diabetes - Continue sliding scale  Lantus 15 units IM nightly  NovoLog 5 units a.m. 3 times daily with meal  HIV - Continue Genvoya 1 tablet daily with breakfast  Elevated Creatinine (1.50) - RESOLVED - Cr 1.23 (10/01/2020), continue to encourage p.o. hydration  Hyponatremia (Na+ 133) and Hyperkalemia (K+ 5.3) on admission- RESOLVED - Recheck BMP shows Na+ 135 and K+ 4.6 on 09/29/20  Na+ 137, K+ 4.2 (10/01/2020) - EKG: Vent rate  109, QTc 436, otherwise wnl (09/30/2020)  PRN's -Trazodone 50mg  PO qHS PRN for insomnia -Alum & mag hydroxide-simeth 30ml PO qHS PRN for GERD -Magnesium hydroxide 30ml PO daily PRN for constipation -Acetaminophen tablet 650mg  PO PRN q6hrs for mild pain  -Clonidine 0.1 mg p.o. 3 times daily as needed for SBP >150 -Zofran 8 mg p.o. every 8 hours as needed for nausea and vomiting  Discharge Planning: -Social work and case management to assist with discharge planning and identification of hospital follow-up needs prior to discharge -Estimated LOS: 3-4 days -Discharge Concerns: Need to establish a safety plan; Medication compliance and effectiveness -Discharge Goals: Return home with outpatient referrals for mental health follow-up including medication management/psychotherapy  Safety and Monitoring: -Involuntary admission to inpatient psychiatric unit for safety, stabilization and treatment -Daily contact with patient to assess and evaluate symptoms and progress in treatment -Patient's case to be discussed in multi-disciplinary team meeting -Observation Level : q15 minute checks -Vital signs: q12 hours -Precautions: suicide, elopement, and assault   Princess BruinsJulie Nguyen, DO, PGY-1 10/01/2020, 3:55 PM

## 2020-10-01 NOTE — Progress Notes (Signed)
Pt visible on the unit , pt stated he was doing ok. Pt was asking about transportation to help find housing. Pt encouraged to ask SW for what kind of transportation needed.     10/01/20 2100  Psych Admission Type (Psych Patients Only)  Admission Status Involuntary  Psychosocial Assessment  Patient Complaints Anxiety  Eye Contact Fair  Facial Expression Anxious;Sad  Affect Anxious;Sad  Speech Slow  Interaction Assertive  Motor Activity Slow  Appearance/Hygiene Unremarkable  Behavior Characteristics Cooperative  Mood Depressed  Thought Process  Coherency WDL  Content WDL  Delusions None reported or observed  Perception WDL  Hallucination None reported or observed  Judgment Poor  Confusion WDL  Danger to Self  Current suicidal ideation? Denies  Self-Injurious Behavior No self-injurious ideation or behavior indicators observed or expressed   Agreement Not to Harm Self Yes  Description of Agreement verbal  Danger to Others  Danger to Others None reported or observed

## 2020-10-01 NOTE — Progress Notes (Signed)
[facility] Group Notes:  (Nursing/MHT/Case Management/Adjunct)  Date:  10/01/2020  Time:  2015  Type of Therapy:   wrap up group  Participation Level:  Minimal  Participation Quality:  Appropriate and Attentive  Affect:  Flat  Cognitive:  Alert  Insight:  Improving  Engagement in Group:  Lacking  Modes of Intervention:  Clarification, Education, and Support  Summary of Progress/Problems: Positive thinking and self-care were discussed. Pt stayed for only the first 10 minutes of group and shared he made it through the day.   Johann Capers S 10/01/2020, 9:22 PM

## 2020-10-02 LAB — GLUCOSE, CAPILLARY
Glucose-Capillary: 150 mg/dL — ABNORMAL HIGH (ref 70–99)
Glucose-Capillary: 77 mg/dL (ref 70–99)
Glucose-Capillary: 236 mg/dL — ABNORMAL HIGH (ref 70–99)
Glucose-Capillary: 102 mg/dL — ABNORMAL HIGH (ref 70–99)
Glucose-Capillary: 50 mg/dL — ABNORMAL LOW (ref 70–99)

## 2020-10-02 NOTE — [facility] Group Notes (Signed)
Patient did not attend morning orientation group.  

## 2020-10-02 NOTE — Progress Notes (Addendum)
[facility] MD Progress Note  10/02/2020 7:02 PM [name]  MRN:  [id number]  CC: SI  Subjective:  [name] is a 35 y.o. adult, with reported past psychiatric history of MDD.  Past medical history of poorly controlled diabetes, HIV, hypertension.  Patient presents involuntarily to [facility] (09/25/2020), transferred from [facility] (09/24/2020) for SI daily for the last 5 to 10 years. [facility] stay day 7.   24hr events:  The patient's chart was reviewed and nursing notes were reviewed. The patient's case was discussed in multidisciplinary team meeting.  Per MAR: - Patient is compliant with scheduled meds. - PRNs: Trazadone for sleep, and clonidine Per RN notes, patient has been attending groups and seen interacting with other patients. Patient slept Number of Hours: 6.75.   Today (10/02/2020): The patient was seen and evaluated on the unit.  On assessment today the patient reported that he does not want to be social currently.  Patient's would sighed heavily multiple times during the interview, stated that he cannot help it if he does not want to talk.  Patient reported that he has an issue with the way certain staff member interacted with another patient.  In regards to his Trileptal, patient reported irritably that he does not know how it feels.  Patient denied nausea, headache, diarrhea.  Patient stated that he is not sleeping at night, the patient did not answer when asked about his appetite.  Patient denied SI/HI/AVH, delusions, paranoia, first rank symptoms.   Principal Problem: MDD (major depressive disorder), recurrent severe, without psychosis (HCC) Diagnosis: Principal Problem:   MDD (major depressive disorder), recurrent severe, without psychosis (HCC) Active Problems:   Diabetes mellitus type 1 (HCC)   HIV infection (HCC)   Essential hypertension  Total Time spent with patient: 20 minutes  Past Psychiatric History:  Patient has had previous  psychiatric admissions.  He is also had psychiatric evaluations when he has been admitted to the hospital for diabetic issues.  It does appear that he has been treated with several antidepressants in the past.  He has been on citalopram for at least 2 to 3 years.  He says it is never really been effective.  Past Medical History:  Past Medical History:  Diagnosis Date   Anxiety    Asthma    no prior hospitalizations, intubations   Depression    Headache    "only w/stress" (11/01/2017)   HIV (human immunodeficiency virus infection) (HCC)    Migraine    "a few/month" (11/01/2017)   Refusal of blood transfusions as patient is Jehovah's Witness    Seizure (HCC)    "only w/my low blood sugars"  (11/01/2017)   Type I diabetes mellitus (HCC)     Past Surgical History:  Procedure Laterality Date   FRACTURE SURGERY     IM NAILING TIBIA Right 11/01/2017   INTRAMEDULLARY (IM) NAIL TIBIALRightGeneral   ORIF ANKLE FRACTURE Right 11/01/2017   ORIF ANKLE FRACTURE Right 11/01/2017   Procedure: OPEN REDUCTION INTERNAL FIXATION (ORIF) ANKLE FRACTURE;  Surgeon: Roby Lofts, MD;  Location: [facility] OR;  Service: Orthopedics;  Laterality: Right;   TIBIA IM NAIL INSERTION Right 11/01/2017   Procedure: INTRAMEDULLARY (IM) NAIL TIBIAL;  Surgeon: Roby Lofts, MD;  Location: [facility] OR;  Service: Orthopedics;  Laterality: Right;   Family History:  Family History  Problem Relation Age of Onset   Thyroid disease Mother    Diabetes Mother    Breast cancer Maternal Grandmother  Diabetes Maternal Grandmother    Family Psychiatric  History:  Patient was unsure about his father, but did state that he felt as though his mother had been treated for psychiatric illness in the past.  His father was killed by a secondary relative when he was very young.  Social History:  Social History   Substance and Sexual Activity  Alcohol Use Yes   Alcohol/week: 7.0 standard drinks   Types: 7 Cans of beer per week     Social  History   Substance and Sexual Activity  Drug Use Not Currently   Frequency: 2.0 times per week   Types: Marijuana   Comment: 11/01/2017  "weekly"    Social History   Socioeconomic History   Marital status: Single    Spouse name: Not on file   Number of children: Not on file   Years of education: Not on file   Highest education level: Not on file  Occupational History   Not on file  Tobacco Use   Smoking status: Every Day    Types: Cigars    Start date: 09/29/2015   Smokeless tobacco: Never   Tobacco comments:    11/01/2017  "2 cigarettes in ~ 6 month "  Vaping Use   Vaping Use: Never used  Substance and Sexual Activity   Alcohol use: Yes    Alcohol/week: 7.0 standard drinks    Types: 7 Cans of beer per week   Drug use: Not Currently    Frequency: 2.0 times per week    Types: Marijuana    Comment: 11/01/2017  "weekly"   Sexual activity: Yes    Partners: Male    Comment: pt. declined condoms  Other Topics Concern   Not on file  Social History Narrative   Not on file   Social Determinants of Health   Financial Resource Strain: Not on file  Food Insecurity: Not on file  Transportation Needs: Not on file  Physical Activity: Not on file  Stress: Not on file  Social Connections: Not on file   Additional Social History:  Specify valuables returned:  (clothing, toiletries, misc items)  Sleep: Fair  Appetite:  Fair  Current Medications: Current Facility-Administered Medications  Medication Dose Route Frequency Provider Last Rate Last Admin   acetaminophen (TYLENOL) tablet 650 mg  650 mg Oral Q6H PRN Bobbitt, Shalon E, NP       alum & mag hydroxide-simeth (MAALOX/MYLANTA) 200-200-20 MG/5ML suspension 30 mL  30 mL Oral Q4H PRN Bobbitt, Shalon E, NP       amLODipine (NORVASC) tablet 10 mg  10 mg Oral Daily Antonieta Pert, MD   10 mg at 10/02/20 0925   cloNIDine (CATAPRES) tablet 0.1 mg  0.1 mg Oral Q8H PRN Bartholomew Crews E, MD   0.1 mg at 10/01/20 1531    elvitegravir-cobicistat-emtricitabine-tenofovir (GENVOYA) 150-150-200-10 MG tablet 1 tablet  1 tablet Oral Q breakfast Bobbitt, Shalon E, NP   1 tablet at 10/02/20 0925   feeding supplement (GLUCERNA SHAKE) (GLUCERNA SHAKE) liquid 237 mL  237 mL Oral TID BM Antonieta Pert, MD   237 mL at 10/02/20 1418   insulin aspart (novoLOG) injection 0-5 Units  0-5 Units Subcutaneous QHS Bobbitt, Shalon E, NP   4 Units at 09/28/20 2208   insulin aspart (novoLOG) injection 0-6 Units  0-6 Units Subcutaneous TID WC Antonieta Pert, MD   2 Units at 10/02/20 1725   insulin aspart (novoLOG) injection 5 Units  5 Units Subcutaneous TID WC  Antonieta Pert, MD   5 Units at 10/02/20 1725   insulin glargine (LANTUS) injection 15 Units  15 Units Subcutaneous QHS Antonieta Pert, MD   15 Units at 10/01/20 2122   magnesium hydroxide (MILK OF MAGNESIA) suspension 30 mL  30 mL Oral Daily PRN Bobbitt, Shalon E, NP       ondansetron (ZOFRAN-ODT) disintegrating tablet 8 mg  8 mg Oral Q8H PRN Antonieta Pert, MD       OXcarbazepine (TRILEPTAL) tablet 150 mg  150 mg Oral BID Princess Bruins, DO   150 mg at 10/02/20 1728   sertraline (ZOLOFT) tablet 50 mg  50 mg Oral Daily Princess Bruins, DO   50 mg at 10/02/20 8938   traZODone (DESYREL) tablet 50 mg  50 mg Oral QHS PRN Bobbitt, Shalon E, NP   50 mg at 10/01/20 2123    Lab Results:  Results for orders placed or performed during the hospital encounter of 09/25/20 (from the past 48 hour(s))  Glucose, capillary     Status: Abnormal   Collection Time: 09/30/20  9:12 PM  Result Value Ref Range   Glucose-Capillary 109 (H) 70 - 99 mg/dL    Comment: Glucose reference range applies only to samples taken after fasting for at least 8 hours.   Comment 1 Notify RN    Comment 2 Document in Chart   Glucose, capillary     Status: Abnormal   Collection Time: 10/01/20  6:14 AM  Result Value Ref Range   Glucose-Capillary 143 (H) 70 - 99 mg/dL    Comment: Glucose reference range  applies only to samples taken after fasting for at least 8 hours.  Basic metabolic panel     Status: Abnormal   Collection Time: 10/01/20  6:31 AM  Result Value Ref Range   Sodium 137 135 - 145 mmol/L   Potassium 4.2 3.5 - 5.1 mmol/L   Chloride 104 98 - 111 mmol/L   CO2 23 22 - 32 mmol/L   Glucose, Bld 149 (H) 70 - 99 mg/dL    Comment: Glucose reference range applies only to samples taken after fasting for at least 8 hours.   BUN 22 (H) 6 - 20 mg/dL   Creatinine, Ser 1.01 0.61 - 1.24 mg/dL   Calcium 9.7 8.9 - 75.1 mg/dL   GFR, Estimated >02 >58 mL/min    Comment: (NOTE) Calculated using the CKD-EPI Creatinine Equation (2021)    Anion gap 10 5 - 15    Comment: Performed at [facility], [address], [place], [place] [address]  Glucose, capillary     Status: Abnormal   Collection Time: 10/01/20 11:59 AM  Result Value Ref Range   Glucose-Capillary 307 (H) 70 - 99 mg/dL    Comment: Glucose reference range applies only to samples taken after fasting for at least 8 hours.  Glucose, capillary     Status: Abnormal   Collection Time: 10/01/20  4:52 PM  Result Value Ref Range   Glucose-Capillary 178 (H) 70 - 99 mg/dL    Comment: Glucose reference range applies only to samples taken after fasting for at least 8 hours.  Glucose, capillary     Status: Abnormal   Collection Time: 10/01/20  8:50 PM  Result Value Ref Range   Glucose-Capillary 110 (H) 70 - 99 mg/dL    Comment: Glucose reference range applies only to samples taken after fasting for at least 8 hours.   Comment 1 Notify RN  Comment 2 Document in Chart   Glucose, capillary     Status: Abnormal   Collection Time: 10/02/20  6:34 AM  Result Value Ref Range   Glucose-Capillary 150 (H) 70 - 99 mg/dL    Comment: Glucose reference range applies only to samples taken after fasting for at least 8 hours.  Glucose, capillary     Status: None   Collection Time: 10/02/20 12:04 PM  Result Value Ref Range    Glucose-Capillary 77 70 - 99 mg/dL    Comment: Glucose reference range applies only to samples taken after fasting for at least 8 hours.  Glucose, capillary     Status: Abnormal   Collection Time: 10/02/20  5:13 PM  Result Value Ref Range   Glucose-Capillary 236 (H) 70 - 99 mg/dL    Comment: Glucose reference range applies only to samples taken after fasting for at least 8 hours.   Comment 1 Notify RN     Blood Alcohol level:  Lab Results  Component Value Date   ETH <10 09/24/2020   ETH <10 02/09/2020    Metabolic Disorder Labs: Lab Results  Component Value Date   HGBA1C 9.7 (H) 09/09/2020   MPG 231.69 09/09/2020   MPG 205.86 02/11/2020   Lab Results  Component Value Date   PROLACTIN 6.0 09/24/2020   Lab Results  Component Value Date   CHOL 187 09/24/2020   TRIG 50 09/24/2020   HDL 100 09/24/2020   CHOLHDL 1.9 09/24/2020   VLDL 10 09/24/2020   LDLCALC 77 09/24/2020   LDLCALC 64 11/22/2019    Physical Findings:  Musculoskeletal: Strength & Muscle Tone: within normal limits Gait & Station: normal Patient leans: N/A  Psychiatric Specialty Exam: Physical Exam Vitals and nursing note reviewed.  HENT:     Head: Normocephalic and atraumatic.  Pulmonary:     Effort: Pulmonary effort is normal.  Skin:    Comments: Multiple areas of healing scars on arms bilaterally which he states are from old chicken pox scaring - no acute pustules, erythema, or induration noted.   Neurological:     Mental Status: He is alert and oriented to person, place, and time.    Review of Systems  Constitutional:  Negative for fever.  Respiratory:  Negative for shortness of breath.   Cardiovascular:  Negative for chest pain.  Gastrointestinal:  Negative for abdominal pain, constipation, nausea and vomiting.  Neurological:  Negative for dizziness.   Blood pressure (!) 133/93, pulse 92, temperature 98.1 F (36.7 C), temperature source Oral, resp. rate 16, height 6\' 1"  (1.854 m), weight 93  kg, SpO2 100 %.Body mass index is 27.05 kg/m.  General Appearance: Casual and appropriate hygeine, is now wearing vision glasses  Eye Contact:  None-patient had his head under the blankets for the whole interview.  Speech:  Clear and Coherent, Normal Rate, and patient would sigh heavily and tone was irritable  Volume:  Normal  Mood:  dysphoric, irritable  Affect:  Congruent  Thought Process:  Coherent, Goal Directed, and Linear   Orientation:  Full (Time, Place, and Person)  Thought Content:  Rumination - on the way he perceives interaction between patient and nighttime staff members. Patient denied SI/HI/AVH, delusions, paranoia, first rank symptoms. Patient is not grossly responding to internal/external stimuli on exam and did not make delusional statements.  Suicidal Thoughts:  No  Homicidal Thoughts:  No  Memory:  Immediate;   Good Recent;   Fair Remote;   Fair  Judgement:  Fair  Insight:  Fair  Psychomotor Activity:  Normal No muscle stiffness, restlessness, tremors, involuntary movements on physical exam.  Concentration:  Concentration: Fair and Attention Span: Fair  Recall:  [employer]iservFair  Fund of Knowledge:  Good  Language:  Good  Akathisia:  NA  Assets:  Communication Skills Desire for Improvement Resilience  ADL's:  Intact  Cognition:  WNL  Sleep:  Number of Hours: 6.75    Treatment Plan Summary: Daily contact with patient to assess and evaluate symptoms and progress in treatment and Medication management  [name] is a 35 y.o. adult, with reported past psychiatric history of MDD.  Past medical history of poorly controlled diabetes, HIV, hypertension.  [facility] for SI daily for the last 5 to 10 years. [facility]     ASSESSMENT: Diagnosis:   - MDD (major depressive disorder), recurrent severe, without psychosis  - Diabetes mellitus type 1   - HIV  infection  - Essential hypertension  TODAY (10/02/2020): Patient was seen lying in bed, in the dark, awake, with the blankets wrapped over his head.  Patient's mood seems labile today, earlier attending [name] stated that she saw him interacting appropriately with another patient.  No med changes today.   PSYCHIATRIC DIAGNOSIS & TREATMENT:  MDD - Continue Zoloft 50 mg p.o. daily - Continue Glucerna shake liquid 237 mL p.o. 3 times daily between meals for decreased appetite due to depression - Continue Trileptal 150 mg p.o. twice daily for mood stability and impulse control (monitoring Na+ with start of medication) r/b/se/a to med discussed with patient     MEDICAL MANAGEMENT: Hypertension - Continue amlodipine 10 mg p.o. daily  Diabetes - Continue sliding scale  Lantus 15 units IM nightly  NovoLog 5 units a.m. 3 times daily with meal  HIV - Continue Genvoya 1 tablet daily with breakfast  Elevated Creatinine (1.50) - RESOLVED - Cr 1.23 (10/01/2020), continue to encourage p.o. hydration  Hyponatremia (Na+ 133) and Hyperkalemia (K+ 5.3) on admission- RESOLVED - Recheck BMP shows Na+ 135 and K+ 4.6 on 09/29/20  Na+ 137, K+ 4.2 (10/01/2020) - monitoring with repeat BMP Sunday with start of Trileptal - EKG: Vent rate 109, QTc 436, otherwise wnl (09/30/2020)  PRN's -Trazodone 50mg  PO qHS PRN for insomnia -Alum & mag hydroxide-simeth 30ml PO qHS PRN for GERD -Magnesium hydroxide 30ml PO daily PRN for constipation -Acetaminophen tablet 650mg  PO PRN q6hrs for mild pain  -Clonidine 0.1 mg p.o. 3 times daily as needed for SBP >150 -Zofran 8 mg p.o. every 8 hours as needed for nausea and vomiting  Discharge Planning: -Social work and case management to assist with discharge planning and identification of hospital follow-up needs prior to discharge -Estimated LOS: 3-4 days -Discharge Concerns: Need to establish a safety plan; Medication compliance and effectiveness -Discharge Goals:  Return home with outpatient referrals for mental health follow-up including medication management/psychotherapy  Safety and Monitoring: -Involuntary admission to inpatient psychiatric unit for safety, stabilization and treatment -Daily contact with patient to assess and evaluate symptoms and progress in treatment -Patient's case to be discussed in multi-disciplinary team meeting -Observation Level : q15 minute checks -Vital signs: q12 hours -Precautions: suicide, elopement, and assault   Princess BruinsJulie Nguyen, DO, PGY-1 10/02/2020, 7:02 PM

## 2020-10-02 NOTE — Progress Notes (Signed)
Pt has been alert and oriented to person, place, time and situation. Pt is calm, cooperative, affect flat, irritable at time, mood labile, at other times pt is noted to be very social with peers, and hugging on a male peer requiring redirection. Pt refuses most groups, makes poor eye contact with staff, but observed making very good eye contact with peers. Pt denies SI/HI/AVH. Pt is medication compliant. Pt is guarded during nursing assessments, forwards little. Will continue to monitor pt per Q15 minute face checks and monitor for safety and progress.

## 2020-10-02 NOTE — Tx Team (Signed)
Interdisciplinary Treatment and Diagnostic Plan Update  10/02/2020 Time of Session: 9:30am [name] MRN: [id number]  Principal Diagnosis: MDD (major depressive disorder), recurrent severe, without psychosis ([place])  Secondary Diagnoses: Principal Problem:   MDD (major depressive disorder), recurrent severe, without psychosis ([place]) Active Problems:   Diabetes mellitus type 1 ([place])   HIV infection ([place])   Essential hypertension   Current Medications:  Current Facility-Administered Medications  Medication Dose Route Frequency Provider Last Rate Last Admin   acetaminophen (TYLENOL) tablet 650 mg  650 mg Oral Q6H PRN Bobbitt, Shalon E, NP       alum & mag hydroxide-simeth (MAALOX/MYLANTA) 200-200-20 MG/5ML suspension 30 mL  30 mL Oral Q4H PRN Bobbitt, Shalon E, NP       amLODipine (NORVASC) tablet 10 mg  10 mg Oral Daily Sharma Covert, MD   10 mg at 10/02/20 0925   cloNIDine (CATAPRES) tablet 0.1 mg  0.1 mg Oral Q8H PRN Viann Fish E, MD   0.1 mg at 10/01/20 1531   elvitegravir-cobicistat-emtricitabine-tenofovir (GENVOYA) 150-150-200-10 MG tablet 1 tablet  1 tablet Oral Q breakfast Bobbitt, Shalon E, NP   1 tablet at 10/02/20 0925   feeding supplement (GLUCERNA SHAKE) (GLUCERNA SHAKE) liquid 237 mL  237 mL Oral TID BM Sharma Covert, MD   237 mL at 10/01/20 1340   insulin aspart (novoLOG) injection 0-5 Units  0-5 Units Subcutaneous QHS Bobbitt, Shalon E, NP   4 Units at 09/28/20 2208   insulin aspart (novoLOG) injection 0-6 Units  0-6 Units Subcutaneous TID WC Sharma Covert, MD   4 Units at 10/01/20 1210   insulin aspart (novoLOG) injection 5 Units  5 Units Subcutaneous TID WC Sharma Covert, MD   5 Units at 10/02/20 0640   insulin glargine (LANTUS) injection 15 Units  15 Units Subcutaneous QHS Sharma Covert, MD   15 Units at 10/01/20 2122   magnesium hydroxide (MILK OF MAGNESIA) suspension 30 mL  30 mL Oral Daily PRN Bobbitt, Shalon E, NP        ondansetron (ZOFRAN-ODT) disintegrating tablet 8 mg  8 mg Oral Q8H PRN Sharma Covert, MD       OXcarbazepine (TRILEPTAL) tablet 150 mg  150 mg Oral BID Merrily Brittle, DO   150 mg at 10/02/20 1093   sertraline (ZOLOFT) tablet 50 mg  50 mg Oral Daily Merrily Brittle, DO   50 mg at 10/02/20 2355   traZODone (DESYREL) tablet 50 mg  50 mg Oral QHS PRN Bobbitt, Shalon E, NP   50 mg at 10/01/20 2123   PTA Medications: Medications Prior to Admission  Medication Sig Dispense Refill Last Dose   amLODipine (NORVASC) 5 MG tablet Take 5 mg by mouth daily.      blood glucose meter kit and supplies Dispense based on patient and insurance preference. Use up to four times daily as directed. (FOR ICD-10 E10.9, E11.9). (Patient taking differently: 1 each by Other route See admin instructions. Dispense based on patient and insurance preference. Use up to four times daily as directed. (FOR ICD-10 E10.9, E11.9).) 1 each 0    Blood Glucose Monitoring Suppl (TRUE METRIX METER) w/Device KIT USE AS DIRECTED (Patient taking differently: 1 each by Other route as directed.) 1 kit 0    citalopram (CELEXA) 20 MG tablet TAKE 1 TABLET BY MOUTH DAILY 30 tablet 11    elvitegravir-cobicistat-emtricitabine-tenofovir (GENVOYA) 150-150-200-10 MG TABS tablet Take 1 tablet by mouth daily with breakfast. 30 tablet 11  glucose blood test strip USE AS DIRECTED (Patient taking differently: 1 each by Other route as directed.) 150 strip 0    insulin aspart (NOVOLOG) 100 UNIT/ML injection INJECT 5 UNITS INTO THE SKIN THREE TIMES DAILY WITH MEALS. (Patient taking differently: Inject 4 Units into the skin 3 (three) times daily with meals.) 10 mL 0    insulin glargine (LANTUS) 100 UNIT/ML injection INJECT 26 UNITS INTO THE SKIN DAILY. (Patient taking differently: Inject 35 Units into the skin daily. Note from 7/13 hospialization for hypoglycemia MD wanted to decrease Lantus to 18 U daily.  Pt reported to me he took 35 U bid. So unclear what does  he should be on vs what he takes.) 10 mL 0    Insulin Syringe-Needle U-100 30G X 1/2" 0.5 ML MISC USE AS DIRECTED WITH INSULIN (Patient taking differently: 1 each by Other route See admin instructions. with insulin) 120 each 0    TRUEplus Lancets 28G MISC USE AS DIRECTED (Patient taking differently: 1 each by Other route as directed.) 200 each 0     Patient Stressors:    Patient Strengths:    Treatment Modalities: Medication Management, Group therapy, Case management,  1 to 1 session with clinician, Psychoeducation, Recreational therapy.   Physician Treatment Plan for Primary Diagnosis: MDD (major depressive disorder), recurrent severe, without psychosis ([place]) Long Term Goal(s): Improvement in symptoms so as ready for discharge   Short Term Goals: Ability to identify changes in lifestyle to reduce recurrence of condition will improve Ability to verbalize feelings will improve Ability to disclose and discuss suicidal ideas Ability to demonstrate self-control will improve Ability to identify and develop effective coping behaviors will improve Ability to maintain clinical measurements within normal limits will improve Compliance with prescribed medications will improve  Medication Management: Evaluate patient's response, side effects, and tolerance of medication regimen.  Therapeutic Interventions: 1 to 1 sessions, Unit Group sessions and Medication administration.  Evaluation of Outcomes: Progressing  Physician Treatment Plan for Secondary Diagnosis: Principal Problem:   MDD (major depressive disorder), recurrent severe, without psychosis ([place]) Active Problems:   Diabetes mellitus type 1 ([place])   HIV infection ([place])   Essential hypertension  Long Term Goal(s): Improvement in symptoms so as ready for discharge   Short Term Goals: Ability to identify changes in lifestyle to reduce recurrence of condition will improve Ability to verbalize feelings will improve Ability to disclose  and discuss suicidal ideas Ability to demonstrate self-control will improve Ability to identify and develop effective coping behaviors will improve Ability to maintain clinical measurements within normal limits will improve Compliance with prescribed medications will improve     Medication Management: Evaluate patient's response, side effects, and tolerance of medication regimen.  Therapeutic Interventions: 1 to 1 sessions, Unit Group sessions and Medication administration.  Evaluation of Outcomes: Progressing   RN Treatment Plan for Primary Diagnosis: MDD (major depressive disorder), recurrent severe, without psychosis ([place]) Long Term Goal(s): Knowledge of disease and therapeutic regimen to maintain health will improve  Short Term Goals: Ability to remain free from injury will improve, Ability to verbalize frustration and anger appropriately will improve, Ability to demonstrate self-control, Ability to participate in decision making will improve, Ability to verbalize feelings will improve, Ability to identify and develop effective coping behaviors will improve, and Compliance with prescribed medications will improve  Medication Management: RN will administer medications as ordered by provider, will assess and evaluate patient's response and provide education to patient for prescribed medication. RN will report any adverse  and/or side effects to prescribing provider.  Therapeutic Interventions: 1 on 1 counseling sessions, Psychoeducation, Medication administration, Evaluate responses to treatment, Monitor vital signs and CBGs as ordered, Perform/monitor CIWA, COWS, AIMS and Fall Risk screenings as ordered, Perform wound care treatments as ordered.  Evaluation of Outcomes: Progressing   LCSW Treatment Plan for Primary Diagnosis: MDD (major depressive disorder), recurrent severe, without psychosis ([place]) Long Term Goal(s): Safe transition to appropriate next level of care at discharge, Engage  patient in therapeutic group addressing interpersonal concerns.  Short Term Goals: Engage patient in aftercare planning with referrals and resources, Increase social support, Increase ability to appropriately verbalize feelings, Increase emotional regulation, Facilitate acceptance of mental health diagnosis and concerns, and Increase skills for wellness and recovery  Therapeutic Interventions: Assess for all discharge needs, 1 to 1 time with Social worker, Explore available resources and support systems, Assess for adequacy in community support network, Educate family and significant other(s) on suicide prevention, Complete Psychosocial Assessment, Interpersonal group therapy.  Evaluation of Outcomes: Progressing   Progress in Treatment: Attending groups: No. Participating in groups: No. Taking medication as prescribed: Yes. Toleration medication: Yes. Family/Significant other contact made: No, will contact:  pt declined consents Patient understands diagnosis: Yes. Discussing patient identified problems/goals with staff: No. Medical problems stabilized or resolved: Yes. Denies suicidal/homicidal ideation: Yes. Issues/concerns per patient self-inventory: No.   New problem(s) identified: No, Describe:  none  New Short Term/Long Term Goal(s):medication stabilization, elimination of SI thoughts, development of comprehensive mental wellness plan.    Patient Goals:   "To leave"  Discharge Plan or Barriers: Patient recently admitted. CSW will continue to follow and assess for appropriate referrals and possible discharge planning.    Reason for Continuation of Hospitalization: Depression Medication stabilization Suicidal ideation  Estimated Length of Stay: 3-5 days  Attendees: Patient: [name] 10/02/2020   Physician: Fatima Sanger, DO 10/02/2020   Nursing:  10/02/2020  RN Care Manager: 10/02/2020   Social Worker: Darletta Moll, LCSW 10/02/2020   Recreational Therapist:  10/02/2020    Other:  10/02/2020   Other:  10/02/2020   Other: 10/02/2020    Scribe for Treatment Team: Mliss Fritz, [place] 10/02/2020 12:16 PM

## 2020-10-02 NOTE — Progress Notes (Signed)
Pt did not attend group. 

## 2020-10-02 NOTE — [facility] Group Notes (Signed)
Pt attended and participated in AA meeting this evening.  

## 2020-10-02 NOTE — [facility] Group Notes (Signed)
Due to the acuity, staffing and complex discharge plans, group was not held. Patient was provided therapeutic worksheets and asked to meet with CSW as needed.   Kayo Zion, LCSWA Clinicial Social Worker [facility]  

## 2020-10-03 LAB — GLUCOSE, CAPILLARY
Glucose-Capillary: 161 mg/dL — ABNORMAL HIGH (ref 70–99)
Glucose-Capillary: 182 mg/dL — ABNORMAL HIGH (ref 70–99)
Glucose-Capillary: 185 mg/dL — ABNORMAL HIGH (ref 70–99)
Glucose-Capillary: 402 mg/dL — ABNORMAL HIGH (ref 70–99)
Glucose-Capillary: 463 mg/dL — ABNORMAL HIGH (ref 70–99)
Glucose-Capillary: 48 mg/dL — ABNORMAL LOW (ref 70–99)
Glucose-Capillary: 89 mg/dL (ref 70–99)

## 2020-10-03 MED ORDER — INSULIN ASPART 100 UNIT/ML IJ SOLN
6.0000 [IU] | Freq: Once | INTRAMUSCULAR | Status: AC
  Administered 2020-10-03: 6 [IU] via SUBCUTANEOUS

## 2020-10-03 NOTE — Progress Notes (Signed)
The focus of this group is to help patients review their daily goal of treatment and discuss progress on daily workbooks.  Pt did not attend the evening group session. 

## 2020-10-03 NOTE — Progress Notes (Signed)
BS at dinner time 463.  Dr. Leone Haven notified.  6 units of insulin given per sliding scale order.

## 2020-10-03 NOTE — Progress Notes (Addendum)
[facility] MD Progress Note  10/03/2020 4:44 PM Zyshonne Nephi Savage  MRN:  [id number]  CC: SI  Subjective:  [name] is a 35 y.o. adult, with reported past psychiatric history of MDD.  Past medical history of poorly controlled diabetes, HIV, hypertension.  Patient presents involuntarily to [facility] (09/25/2020), transferred from [facility] (09/24/2020) for SI daily for the last 5 to 10 years. [facility] stay day 8.   24hr events:  The patient's chart was reviewed and nursing notes were reviewed. The patient's case was discussed in multidisciplinary team meeting.  Per MAR: - Patient is compliant with scheduled meds. - PRNs: Trazadone for sleep. Per RN notes, patient did not attend morning groups. Patient slept Number of Hours: 6.75.   Today (10/03/2020): The patient was seen and evaluated on the unit.  Pt is seen and examined today. Pt states his mood is "ok" . Pt rates his mood at 6/10 (10 is the best mood). Pt s states he did not sleep well last night and he never sleeps at night.  Nursing notes indicate that Pt slept for   6.75 hours. Pt states his appetite is good. Pt feels anxious. Currently, Pt denies any suicidal ideation.  He endorses vague passive homicidal ideation states some people here in the unit try to go under my skin, and he does not have an outlet.  Denies any plan or intent.  He does not feel like interacting with others.  He denies visual and auditory hallucination.  He reports some headache.  Discussed with patient that we will stop mealtime insulin because of recent hypoglycemic episodes..  Patient verbalizes understanding. Pt denies any  nausea, vomiting, dizziness, chest pain, SOB, abdominal pain, diarrhea, and constipation.  He states he wants to be discharged and can get better on his own.  Pt denies any medication side effects and has been tolerating it well. Pt denies any concerns.   RN reported patient's blood glucose down at 48 at 11.58 am.  Mealtime  insulin stopped. Principal Problem: MDD (major depressive disorder), recurrent severe, without psychosis (HCC) Diagnosis: Principal Problem:   MDD (major depressive disorder), recurrent severe, without psychosis (HCC) Active Problems:   Diabetes mellitus type 1 (HCC)   HIV infection (HCC)   Essential hypertension  Total Time spent with patient: 20 minutes  Past Psychiatric History:  Patient has had previous psychiatric admissions.  He is also had psychiatric evaluations when he has been admitted to the hospital for diabetic issues.  It does appear that he has been treated with several antidepressants in the past.  He has been on citalopram for at least 2 to 3 years.  He says it is never really been effective.  Past Medical History:  Past Medical History:  Diagnosis Date   Anxiety    Asthma    no prior hospitalizations, intubations   Depression    Headache    "only w/stress" (11/01/2017)   HIV (human immunodeficiency virus infection) (HCC)    Migraine    "a few/month" (11/01/2017)   Refusal of blood transfusions as patient is Jehovah's Witness    Seizure (HCC)    "only w/my low blood sugars"  (11/01/2017)   Type I diabetes mellitus (HCC)     Past Surgical History:  Procedure Laterality Date   FRACTURE SURGERY     IM NAILING TIBIA Right 11/01/2017   INTRAMEDULLARY (IM) NAIL TIBIALRightGeneral   ORIF ANKLE FRACTURE Right 11/01/2017   ORIF ANKLE FRACTURE Right 11/01/2017   Procedure:  OPEN REDUCTION INTERNAL FIXATION (ORIF) ANKLE FRACTURE;  Surgeon: Roby Lofts, MD;  Location: [facility] OR;  Service: Orthopedics;  Laterality: Right;   TIBIA IM NAIL INSERTION Right 11/01/2017   Procedure: INTRAMEDULLARY (IM) NAIL TIBIAL;  Surgeon: Roby Lofts, MD;  Location: [facility] OR;  Service: Orthopedics;  Laterality: Right;   Family History:  Family History  Problem Relation Age of Onset   Thyroid disease Mother    Diabetes Mother    Breast cancer Maternal Grandmother    Diabetes Maternal Grandmother     Family Psychiatric  History:  Patient was unsure about his father, but did state that he felt as though his mother had been treated for psychiatric illness in the past.  His father was killed by a secondary relative when he was very young.  Social History:  Social History   Substance and Sexual Activity  Alcohol Use Yes   Alcohol/week: 7.0 standard drinks   Types: 7 Cans of beer per week     Social History   Substance and Sexual Activity  Drug Use Not Currently   Frequency: 2.0 times per week   Types: Marijuana   Comment: 11/01/2017  "weekly"    Social History   Socioeconomic History   Marital status: Single    Spouse name: Not on file   Number of children: Not on file   Years of education: Not on file   Highest education level: Not on file  Occupational History   Not on file  Tobacco Use   Smoking status: Every Day    Types: Cigars    Start date: 09/29/2015   Smokeless tobacco: Never   Tobacco comments:    11/01/2017  "2 cigarettes in ~ 6 month "  Vaping Use   Vaping Use: Never used  Substance and Sexual Activity   Alcohol use: Yes    Alcohol/week: 7.0 standard drinks    Types: 7 Cans of beer per week   Drug use: Not Currently    Frequency: 2.0 times per week    Types: Marijuana    Comment: 11/01/2017  "weekly"   Sexual activity: Yes    Partners: Male    Comment: pt. declined condoms  Other Topics Concern   Not on file  Social History Narrative   Not on file   Social Determinants of Health   Financial Resource Strain: Not on file  Food Insecurity: Not on file  Transportation Needs: Not on file  Physical Activity: Not on file  Stress: Not on file  Social Connections: Not on file   Additional Social History:  Specify valuables returned:  (clothing, toiletries, misc items)  Sleep: fair  Appetite:  good  Current Medications: Current Facility-Administered Medications  Medication Dose Route Frequency Provider Last Rate Last Admin   acetaminophen  (TYLENOL) tablet 650 mg  650 mg Oral Q6H PRN Bobbitt, Shalon E, NP       alum & mag hydroxide-simeth (MAALOX/MYLANTA) 200-200-20 MG/5ML suspension 30 mL  30 mL Oral Q4H PRN Bobbitt, Shalon E, NP       amLODipine (NORVASC) tablet 10 mg  10 mg Oral Daily Antonieta Pert, MD   10 mg at 10/03/20 0954   cloNIDine (CATAPRES) tablet 0.1 mg  0.1 mg Oral Q8H PRN Bartholomew Crews E, MD   0.1 mg at 10/01/20 1531   elvitegravir-cobicistat-emtricitabine-tenofovir (GENVOYA) 150-150-200-10 MG tablet 1 tablet  1 tablet Oral Q breakfast Bobbitt, Shalon E, NP   1 tablet at 10/03/20 0954   feeding supplement (  GLUCERNA SHAKE) (GLUCERNA SHAKE) liquid 237 mL  237 mL Oral TID BM Antonieta Pert, MD   237 mL at 10/03/20 1437   insulin aspart (novoLOG) injection 0-5 Units  0-5 Units Subcutaneous QHS Bobbitt, Shalon E, NP   4 Units at 09/28/20 2208   insulin aspart (novoLOG) injection 0-6 Units  0-6 Units Subcutaneous TID WC Antonieta Pert, MD   1 Units at 10/03/20 0720   insulin glargine (LANTUS) injection 15 Units  15 Units Subcutaneous QHS Antonieta Pert, MD   15 Units at 10/02/20 2240   magnesium hydroxide (MILK OF MAGNESIA) suspension 30 mL  30 mL Oral Daily PRN Bobbitt, Shalon E, NP       ondansetron (ZOFRAN-ODT) disintegrating tablet 8 mg  8 mg Oral Q8H PRN Antonieta Pert, MD       OXcarbazepine (TRILEPTAL) tablet 150 mg  150 mg Oral BID Princess Bruins, DO   150 mg at 10/03/20 2620   sertraline (ZOLOFT) tablet 50 mg  50 mg Oral Daily Princess Bruins, DO   50 mg at 10/03/20 3559   traZODone (DESYREL) tablet 50 mg  50 mg Oral QHS PRN Bobbitt, Shalon E, NP   50 mg at 10/02/20 2132    Lab Results:  Results for orders placed or performed during the hospital encounter of 09/25/20 (from the past 48 hour(s))  Glucose, capillary     Status: Abnormal   Collection Time: 10/01/20  4:52 PM  Result Value Ref Range   Glucose-Capillary 178 (H) 70 - 99 mg/dL    Comment: Glucose reference range applies only to samples  taken after fasting for at least 8 hours.  Glucose, capillary     Status: Abnormal   Collection Time: 10/01/20  8:50 PM  Result Value Ref Range   Glucose-Capillary 110 (H) 70 - 99 mg/dL    Comment: Glucose reference range applies only to samples taken after fasting for at least 8 hours.   Comment 1 Notify RN    Comment 2 Document in Chart   Glucose, capillary     Status: Abnormal   Collection Time: 10/02/20  6:34 AM  Result Value Ref Range   Glucose-Capillary 150 (H) 70 - 99 mg/dL    Comment: Glucose reference range applies only to samples taken after fasting for at least 8 hours.  Glucose, capillary     Status: None   Collection Time: 10/02/20 12:04 PM  Result Value Ref Range   Glucose-Capillary 77 70 - 99 mg/dL    Comment: Glucose reference range applies only to samples taken after fasting for at least 8 hours.  Glucose, capillary     Status: Abnormal   Collection Time: 10/02/20  5:13 PM  Result Value Ref Range   Glucose-Capillary 236 (H) 70 - 99 mg/dL    Comment: Glucose reference range applies only to samples taken after fasting for at least 8 hours.   Comment 1 Notify RN   Glucose, capillary     Status: Abnormal   Collection Time: 10/02/20  9:03 PM  Result Value Ref Range   Glucose-Capillary 50 (L) 70 - 99 mg/dL    Comment: Glucose reference range applies only to samples taken after fasting for at least 8 hours.  Glucose, capillary     Status: Abnormal   Collection Time: 10/02/20  9:25 PM  Result Value Ref Range   Glucose-Capillary 102 (H) 70 - 99 mg/dL    Comment: Glucose reference range applies only to samples taken after fasting  for at least 8 hours.  Glucose, capillary     Status: Abnormal   Collection Time: 10/03/20  5:52 AM  Result Value Ref Range   Glucose-Capillary 182 (H) 70 - 99 mg/dL    Comment: Glucose reference range applies only to samples taken after fasting for at least 8 hours.   Comment 1 Notify RN   Glucose, capillary     Status: Abnormal   Collection  Time: 10/03/20  8:27 AM  Result Value Ref Range   Glucose-Capillary 161 (H) 70 - 99 mg/dL    Comment: Glucose reference range applies only to samples taken after fasting for at least 8 hours.  Glucose, capillary     Status: Abnormal   Collection Time: 10/03/20 11:58 AM  Result Value Ref Range   Glucose-Capillary 48 (L) 70 - 99 mg/dL    Comment: Glucose reference range applies only to samples taken after fasting for at least 8 hours.  Glucose, capillary     Status: None   Collection Time: 10/03/20 12:17 PM  Result Value Ref Range   Glucose-Capillary 89 70 - 99 mg/dL    Comment: Glucose reference range applies only to samples taken after fasting for at least 8 hours.    Blood Alcohol level:  Lab Results  Component Value Date   ETH <10 09/24/2020   ETH <10 02/09/2020    Metabolic Disorder Labs: Lab Results  Component Value Date   HGBA1C 9.7 (H) 09/09/2020   MPG 231.69 09/09/2020   MPG 205.86 02/11/2020   Lab Results  Component Value Date   PROLACTIN 6.0 09/24/2020   Lab Results  Component Value Date   CHOL 187 09/24/2020   TRIG 50 09/24/2020   HDL 100 09/24/2020   CHOLHDL 1.9 09/24/2020   VLDL 10 09/24/2020   LDLCALC 77 09/24/2020   LDLCALC 64 11/22/2019    Physical Findings:  Musculoskeletal: Strength & Muscle Tone: within normal limits Gait & Station: normal Patient leans: N/A   Psychiatric Specialty Exam: Physical Exam Vitals and nursing note reviewed.  HENT:     Head: Normocephalic and atraumatic.  Pulmonary:     Effort: Pulmonary effort is normal.  Neurological:     General: No focal deficit present.     Mental Status: He is alert and oriented to person, place, and time.    Review of Systems  Constitutional:  Negative for fever.  Respiratory:  Negative for shortness of breath.   Cardiovascular:  Negative for chest pain.  Gastrointestinal:  Negative for abdominal pain, constipation, nausea and vomiting.  Neurological:  Negative for dizziness.   Psychiatric/Behavioral:  Positive for dysphoric mood and sleep disturbance. Negative for hallucinations. The patient is nervous/anxious.    Blood pressure (!) 149/93, pulse (!) 101, temperature 98.1 F (36.7 C), temperature source Oral, resp. rate 16, height 6\' 1"  (1.854 m), weight 93 kg, SpO2 100 %.Body mass index is 27.05 kg/m.  General Appearance: Casual and appropriate hygeine, is now wearing vision glasses  Eye Contact:  Fair  Speech:  Clear and Coherent, Normal Rate, and tone was irritable  Volume:  Normal  Mood:  dysphoric, irritable  Affect:  Congruent  Thought Process:  Coherent, Goal Directed, and Linear   Orientation:  Full (Time, Place, and Person)  Thought Content:  Rumination; no evidence of psychosis, paranoia, or delusions on exam  Suicidal Thoughts:  No  Homicidal Thoughts:  Yes.  without intent/plan Passive ,states  some people here in the unit try to go under my skin,  and he does not have an outlet.  Denies any plan or intent.   Memory:  Immediate;   Good Recent;   Fair Remote;   Fair  Judgement:  Fair  Insight:  Fair  Psychomotor Activity:  Normal No muscle stiffness, restlessness, tremors, involuntary movements on physical exam.  Concentration:  Concentration: Fair and Attention Span: Fair  Recall:  [employer] of Knowledge:  Good  Language:  Good  Akathisia:  NA  Assets:  Communication Skills Desire for Improvement Resilience  ADL's:  Intact  Cognition:  WNL  Sleep:  6.75     Treatment Plan Summary: Daily contact with patient to assess and evaluate symptoms and progress in treatment and Medication management  [name] is a 35 y.o. adult, with reported past psychiatric history of MDD.  Past medical history of poorly controlled diabetes, HIV, hypertension.  Patient presents involuntarily to [facility] (09/25/2020), transferred from [facility] (09/24/2020) for SI daily for the last 5 to 10 years. [facility] stay day 8.      ASSESSMENT: Diagnosis:   - MDD (major depressive disorder), recurrent severe, without psychosis  - Diabetes mellitus type 1   - HIV infection  - Essential hypertension  PSYCHIATRIC DIAGNOSIS & TREATMENT:  MDD - Continue Zoloft 50 mg p.o. daily - Continue Glucerna shake liquid 237 mL p.o. 3 times daily between meals for decreased appetite due to depression - Continue Trileptal 150 mg p.o. twice daily for mood stability and impulse control (monitoring Na+ with start of medication) r/b/se/a to med discussed with patient .  Repeat CMP tomorrow morning.    MEDICAL MANAGEMENT: Hypertension - Continue amlodipine 10 mg p.o. daily  Diabetes - Continue sliding scale  Lantus 15 units IM nightly  Stop mealtime insulin due to hypoglycemic events.            Continue monitoring CBGs 4 times daily  HIV - Continue Genvoya 1 tablet daily with breakfast  Elevated Creatinine (1.50) - RESOLVED - Cr 1.23 (10/01/2020), continue to encourage p.o. hydration  Hyponatremia (Na+ 133) and Hyperkalemia (K+ 5.3) on admission- RESOLVED -Most recent BMP shows Na+ 137 and K+ 4.2 on 10/01/20 -Will monitor CMP tomorrow morning. - EKG: Vent rate 109, QTc 436, otherwise wnl (09/30/2020)  PRN's -Trazodone 50mg  PO qHS PRN for insomnia -Alum & mag hydroxide-simeth 72ml PO qHS PRN for GERD -Magnesium hydroxide 29ml PO daily PRN for constipation -Acetaminophen tablet 650mg  PO PRN q6hrs for mild pain  -Clonidine 0.1 mg p.o. 3 times daily as needed for SBP >150 -Zofran 8 mg p.o. every 8 hours as needed for nausea and vomiting  Discharge Planning: -Social work and case management to assist with discharge planning and identification of hospital follow-up needs prior to discharge - patient now wants to stay with mother at discharge and is considering 31m -Estimated LOS: 2-3 days -Discharge Concerns: Need to establish a safety plan; Medication compliance and effectiveness -Discharge Goals: Return home with  outpatient referrals for mental health follow-up including medication management/psychotherapy  Safety and Monitoring: -Involuntary admission to inpatient psychiatric unit for safety, stabilization and treatment -Daily contact with patient to assess and evaluate symptoms and progress in treatment -Patient's case to be discussed in multi-disciplinary team meeting -Observation Level : q15 minute checks -Vital signs: q12 hours -Precautions: suicide, elopement, and assault   , MD, PGY-1 10/03/2020, 4:44 PM

## 2020-10-03 NOTE — Progress Notes (Signed)
Pt CBG 402 , NP- Eddie ordered 1X 6 units of Novolog to go along with 15 units Lantus. Will continue to monitor

## 2020-10-03 NOTE — Progress Notes (Signed)
Pt took medications without incident and was calm on the unit today.  Pt's blood sugar has fluctuated throughout the day. Blood sugar was 48 at lunchtime and 463 at dinner time.  RN notified Dr. Leone Haven about pt's fluctuating blood sugars.  Pt will get lab work in the morning.  Pt remains safe on the unit with q 15 min checks in place.  10/03/20 1000  Psych Admission Type (Psych Patients Only)  Admission Status Involuntary  Psychosocial Assessment  Patient Complaints None  Eye Contact Fair  Facial Expression Sad  Affect Sad  Speech Slow  Interaction Assertive  Motor Activity Slow  Appearance/Hygiene Unremarkable  Behavior Characteristics Appropriate to situation;Guarded  Mood Depressed  Thought Process  Coherency WDL  Content WDL  Delusions None reported or observed  Perception WDL  Hallucination None reported or observed  Judgment Poor  Confusion WDL  Danger to Self  Current suicidal ideation? Denies  Danger to Others  Danger to Others None reported or observed

## 2020-10-03 NOTE — Progress Notes (Signed)
Hypoglycemic Event     1200  CBG: 48  Treatment: 8 oz juice/soda  Symptoms:  tired, weak  Follow-up CBG: Time:1215 CBG Result:89  Possible Reasons for Event: Unknown     [name]

## 2020-10-03 NOTE — Progress Notes (Signed)
Pt did not attend group. 

## 2020-10-03 NOTE — Progress Notes (Signed)
Pt visible on the unit this evening, pt stated he was feeling a little better. Pt stated he will D/C to mom and they will be checking out where he is going to be staying. Pt given PRN Trazodone with HS insulin    10/03/20 2000  Psych Admission Type (Psych Patients Only)  Admission Status Involuntary  Psychosocial Assessment  Patient Complaints None  Eye Contact Fair  Facial Expression Sad  Affect Sad  Speech Slow  Interaction Assertive  Motor Activity Slow  Appearance/Hygiene Unremarkable  Behavior Characteristics Cooperative  Mood Depressed  Thought Process  Coherency WDL  Content WDL  Delusions None reported or observed  Perception WDL  Hallucination None reported or observed  Judgment Poor  Confusion WDL  Danger to Self  Current suicidal ideation? Denies  Danger to Others  Danger to Others None reported or observed

## 2020-10-03 NOTE — Progress Notes (Signed)
Hypoglycemic Event  CBG: 50  Treatment: 8 oz juice/soda  Symptoms:  "sluggish"  Follow-up CBG: Time: 2125 CBG Result:102  Possible Reasons for Event: Unknown  Comments/MD notified:  Pt reported feeling better.  He was given a snack and HS Lantus was given.  No acute distress noted    Levin Bacon

## 2020-10-03 NOTE — Progress Notes (Signed)
[name] was isolative to his room this evening.  He did wake up while group was being held believing that his blood sugar is dropping.  It was assessed to be 50.  He was given 8 oz juice and rechecked in 15 minutes which went up to 102.  He stated that he was feeling much better.  He continues to voice depression but denied SI/HI or AVH.  He verbally agreed to not harm himself on the unit.  Resting BP rechecked per MD request.  He voiced no headache, chest pain or dizziness.  He took his bedtime medications and promptly returned to his room.  He is currently resting with his eyes closed and appears to be asleep.  Q 15 minute checks maintained for safety.     10/02/20 2132  Psych Admission Type (Psych Patients Only)  Admission Status Involuntary  Psychosocial Assessment  Patient Complaints Anergic  Eye Contact Fair  Facial Expression Sad  Affect Sad  Speech Slow  Interaction Assertive  Motor Activity Slow  Appearance/Hygiene Unremarkable  Behavior Characteristics Cooperative;Appropriate to situation  Mood Depressed;Pleasant  Thought Process  Coherency WDL  Content WDL  Delusions None reported or observed  Perception WDL  Judgment Poor  Confusion WDL  Danger to Self  Current suicidal ideation? Denies  Danger to Others  Danger to Others None reported or observed

## 2020-10-04 DIAGNOSIS — I10 Essential (primary) hypertension: Secondary | ICD-10-CM

## 2020-10-04 DIAGNOSIS — E1021 Type 1 diabetes mellitus with diabetic nephropathy: Secondary | ICD-10-CM

## 2020-10-04 LAB — COMPREHENSIVE METABOLIC PANEL
ALT: 20 U/L (ref 0–44)
AST: 15 U/L (ref 15–41)
Albumin: 4.3 g/dL (ref 3.5–5.0)
Alkaline Phosphatase: 56 U/L (ref 38–126)
Anion gap: 9 (ref 5–15)
BUN: 22 mg/dL — ABNORMAL HIGH (ref 6–20)
CO2: 26 mmol/L (ref 22–32)
Calcium: 9.8 mg/dL (ref 8.9–10.3)
Chloride: 98 mmol/L (ref 98–111)
Creatinine, Ser: 1.44 mg/dL — ABNORMAL HIGH (ref 0.61–1.24)
GFR, Estimated: >60 mL/min (ref >60)
Glucose, Bld: 364 mg/dL — ABNORMAL HIGH (ref 70–99)
Potassium: 4.3 mmol/L (ref 3.5–5.1)
Sodium: 133 mmol/L — ABNORMAL LOW (ref 135–145)
Total Bilirubin: 0.6 mg/dL (ref 0.3–1.2)
Total Protein: 7.8 g/dL (ref 6.5–8.1)

## 2020-10-04 LAB — GLUCOSE, CAPILLARY
Glucose-Capillary: 58 mg/dL — ABNORMAL LOW (ref 70–99)
Glucose-Capillary: 66 mg/dL — ABNORMAL LOW (ref 70–99)
Glucose-Capillary: 78 mg/dL (ref 70–99)
Glucose-Capillary: 270 mg/dL — ABNORMAL HIGH (ref 70–99)
Glucose-Capillary: 253 mg/dL — ABNORMAL HIGH (ref 70–99)
Glucose-Capillary: 494 mg/dL — ABNORMAL HIGH (ref 70–99)
Glucose-Capillary: 453 mg/dL — ABNORMAL HIGH (ref 70–99)

## 2020-10-04 MED ORDER — HYDRALAZINE HCL 50 MG PO TABS: 50.0000 mg | ORAL_TABLET | Freq: Three times a day (TID) | ORAL | Status: DC

## 2020-10-04 MED ORDER — HYDRALAZINE HCL 25 MG PO TABS
25.0000 mg | ORAL_TABLET | Freq: Three times a day (TID) | ORAL | Status: DC
  Administered 2020-10-04 – 2020-10-08 (×11): 25 mg via ORAL
  Filled 2020-10-04 (×5): qty 1
  Filled 2020-10-04: qty 21
  Filled 2020-10-04: qty 1
  Filled 2020-10-04: qty 21
  Filled 2020-10-04 (×2): qty 1
  Filled 2020-10-04 (×2): qty 21
  Filled 2020-10-04: qty 1
  Filled 2020-10-04: qty 21
  Filled 2020-10-04 (×3): qty 1
  Filled 2020-10-04: qty 21
  Filled 2020-10-04 (×7): qty 1

## 2020-10-04 NOTE — Progress Notes (Signed)
Patient was sitting on the hallway with another patient [name] talking. Both patients stood up and began to embrace each other. The RN Velna Hatchet and the [facility] Sir explained they can not embrace each other. There is no touching under no circumstance.

## 2020-10-04 NOTE — Progress Notes (Signed)
Pt refused AM meds at scheduled time. Pt agreed to take them late with a great deal of encouragement from staff. BP checked manually per orders. Clonidine PRN for HTN on hold at this time, per Dr. Princess Bruins, MD who was made aware pt just took his scheduled Norvasc 5mg  PO scheduled late, and instructed writer that pt should be considered in 2 hours if pt's BP if pt meets those order parameters at that time. Plan is to recheck BP manually at that time as well, as at the Q4 hour marks.

## 2020-10-04 NOTE — Progress Notes (Signed)
Re-check CBG at 2353 was185

## 2020-10-04 NOTE — [facility] Group Notes (Signed)
[facility] Group Notes: (Clinical Social Work)   10/04/2020      Type of Therapy:  Group Therapy   Participation Level:  Did Not Attend - was invited both individually by [facility] and by overhead announcement, chose not to attend.   Ambrose Mantle, LCSW 10/04/2020, 12:02 PM

## 2020-10-04 NOTE — Progress Notes (Addendum)
[facility] MD Progress Note  10/04/2020 5:20 PM [name]  MRN:  [id number]  CC: SI  Subjective:  [name] is a 35 y.o. adult, with reported past psychiatric history of MDD.  Past medical history of poorly controlled diabetes, HIV, hypertension.  Patient presents involuntarily to [facility] (09/25/2020), transferred from [facility] (09/24/2020) for SI daily for the last 5 to 10 years. [facility] stay day 9.   24hr events:  The patient's chart was reviewed and nursing notes were reviewed. The patient's case was discussed in multidisciplinary team meeting.  Per MAR: - Patient is compliant with scheduled meds, however did initially refuse them but later accepted them with RN encouragement. - PRNs: Trazadone for sleep. Per RN notes, patient's glucose has been labile and has not been attending groups. Patient slept Number of Hours: 5.25.   Today (10/04/2020): The patient was seen and evaluated on the unit. Patient is seen and examined today.  Patient was compliant on interview however would groan and sigh loudly.  Patient stated that he is irritated because he is encouraged to leave his bed during the day, stated that there is nothing to do here.  Patient stated that he is more awake at night.  Stated that he is used to more physical activity such as walking and going outside. In regards to his fluctuating glucose, patient stated that it may be due to him eating less during meals.  Stated that his appetite is poor. Patient admitted to headaches starting this morning, that waxes and wane in severity, located in frontal region and below the eyebrows. Patient denied nausea, vomiting, abdominal pain, change in vision.  Admitted to regular bowel movements.  Encouraged patient to keep up his increased p.o. fluid intake, to the best of his abilities keep a consistent eating schedule, and do not skip meals. Patient denied permission to talk to his mother, stated that he did not want  her to worry because she has problems of her own. Patient denied SI/HI/AVH, delusions, paranoia, first rank symptoms.  Consults:  Medicine, Dr. Margie Ege, in regards to patient's hypertension and fluctuating glucose. Appreciated Dr. Rogelia Mire recommendation, stated to start hydralazine right now, and repeat BMP to assess for kidney function.  Stated that her renal function is stable, can consider transitioning from hydralazine to ARB for long term.  In regards to patient's glucose, recheck BMP and defer to diabetic coordinators recommendation.   Principal Problem: MDD (major depressive disorder), recurrent severe, without psychosis (HCC) Diagnosis: Principal Problem:   MDD (major depressive disorder), recurrent severe, without psychosis (HCC) Active Problems:   Diabetes mellitus type 1 (HCC)   HIV infection (HCC)   Essential hypertension  Total Time spent with patient: 20 minutes  Past Psychiatric History:  Patient has had previous psychiatric admissions.  He is also had psychiatric evaluations when he has been admitted to the hospital for diabetic issues.  It does appear that he has been treated with several antidepressants in the past.  He has been on citalopram for at least 2 to 3 years.  He says it is never really been effective.  Past Medical History:  Past Medical History:  Diagnosis Date   Anxiety    Asthma    no prior hospitalizations, intubations   Depression    Headache    "only w/stress" (11/01/2017)   HIV (human immunodeficiency virus infection) (HCC)    Migraine    "a few/month" (11/01/2017)   Refusal of blood transfusions as patient is [employer]  Witness    Seizure (HCC)    "only w/my low blood sugars"  (11/01/2017)   Type I diabetes mellitus (HCC)     Past Surgical History:  Procedure Laterality Date   FRACTURE SURGERY     IM NAILING TIBIA Right 11/01/2017   INTRAMEDULLARY (IM) NAIL TIBIALRightGeneral   ORIF ANKLE FRACTURE Right 11/01/2017   ORIF ANKLE FRACTURE  Right 11/01/2017   Procedure: OPEN REDUCTION INTERNAL FIXATION (ORIF) ANKLE FRACTURE;  Surgeon: Roby Lofts, MD;  Location: [facility] OR;  Service: Orthopedics;  Laterality: Right;   TIBIA IM NAIL INSERTION Right 11/01/2017   Procedure: INTRAMEDULLARY (IM) NAIL TIBIAL;  Surgeon: Roby Lofts, MD;  Location: [facility] OR;  Service: Orthopedics;  Laterality: Right;   Family History:  Family History  Problem Relation Age of Onset   Thyroid disease Mother    Diabetes Mother    Breast cancer Maternal Grandmother    Diabetes Maternal Grandmother    Family Psychiatric  History:  Patient was unsure about his father, but did state that he felt as though his mother had been treated for psychiatric illness in the past.  His father was killed by a secondary relative when he was very young.  Social History:  Social History   Substance and Sexual Activity  Alcohol Use Yes   Alcohol/week: 7.0 standard drinks   Types: 7 Cans of beer per week     Social History   Substance and Sexual Activity  Drug Use Not Currently   Frequency: 2.0 times per week   Types: Marijuana   Comment: 11/01/2017  "weekly"    Social History   Socioeconomic History   Marital status: Single    Spouse name: Not on file   Number of children: Not on file   Years of education: Not on file   Highest education level: Not on file  Occupational History   Not on file  Tobacco Use   Smoking status: Every Day    Types: Cigars    Start date: 09/29/2015   Smokeless tobacco: Never   Tobacco comments:    11/01/2017  "2 cigarettes in ~ 6 month "  Vaping Use   Vaping Use: Never used  Substance and Sexual Activity   Alcohol use: Yes    Alcohol/week: 7.0 standard drinks    Types: 7 Cans of beer per week   Drug use: Not Currently    Frequency: 2.0 times per week    Types: Marijuana    Comment: 11/01/2017  "weekly"   Sexual activity: Yes    Partners: Male    Comment: pt. declined condoms  Other Topics Concern   Not on file  Social  History Narrative   Not on file   Social Determinants of Health   Financial Resource Strain: Not on file  Food Insecurity: Not on file  Transportation Needs: Not on file  Physical Activity: Not on file  Stress: Not on file  Social Connections: Not on file   Additional Social History:  Specify valuables returned:  (clothing, toiletries, misc items)  Sleep: fair  Appetite: Poor  Current Medications: Current Facility-Administered Medications  Medication Dose Route Frequency Provider Last Rate Last Admin   acetaminophen (TYLENOL) tablet 650 mg  650 mg Oral Q6H PRN Bobbitt, Shalon E, NP   650 mg at 10/04/20 1527   alum & mag hydroxide-simeth (MAALOX/MYLANTA) 200-200-20 MG/5ML suspension 30 mL  30 mL Oral Q4H PRN Bobbitt, Shalon E, NP       amLODipine (NORVASC) tablet  10 mg  10 mg Oral Daily Antonieta Pert, MD   10 mg at 10/04/20 0932   cloNIDine (CATAPRES) tablet 0.1 mg  0.1 mg Oral Q8H PRN Bartholomew Crews E, MD   0.1 mg at 10/01/20 1531   elvitegravir-cobicistat-emtricitabine-tenofovir (GENVOYA) 150-150-200-10 MG tablet 1 tablet  1 tablet Oral Q breakfast Bobbitt, Shalon E, NP   1 tablet at 10/04/20 0921   feeding supplement (GLUCERNA SHAKE) (GLUCERNA SHAKE) liquid 237 mL  237 mL Oral TID BM Antonieta Pert, MD   237 mL at 10/03/20 2200   hydrALAZINE (APRESOLINE) tablet 25 mg  25 mg Oral Q8H Kyle, Tyrone A, DO   25 mg at 10/04/20 1536   insulin aspart (novoLOG) injection 0-5 Units  0-5 Units Subcutaneous QHS Bobbitt, Shalon E, NP   4 Units at 09/28/20 2208   insulin glargine (LANTUS) injection 15 Units  15 Units Subcutaneous QHS Antonieta Pert, MD   15 Units at 10/03/20 2158   magnesium hydroxide (MILK OF MAGNESIA) suspension 30 mL  30 mL Oral Daily PRN Bobbitt, Shalon E, NP       ondansetron (ZOFRAN-ODT) disintegrating tablet 8 mg  8 mg Oral Q8H PRN Antonieta Pert, MD       OXcarbazepine (TRILEPTAL) tablet 150 mg  150 mg Oral BID Princess Bruins, DO   150 mg at 10/04/20 0865    sertraline (ZOLOFT) tablet 50 mg  50 mg Oral Daily Princess Bruins, DO   50 mg at 10/04/20 7846   traZODone (DESYREL) tablet 50 mg  50 mg Oral QHS PRN Bobbitt, Shalon E, NP   50 mg at 10/03/20 2157    Lab Results:  Results for orders placed or performed during the hospital encounter of 09/25/20 (from the past 48 hour(s))  Glucose, capillary     Status: Abnormal   Collection Time: 10/02/20  9:03 PM  Result Value Ref Range   Glucose-Capillary 50 (L) 70 - 99 mg/dL    Comment: Glucose reference range applies only to samples taken after fasting for at least 8 hours.  Glucose, capillary     Status: Abnormal   Collection Time: 10/02/20  9:25 PM  Result Value Ref Range   Glucose-Capillary 102 (H) 70 - 99 mg/dL    Comment: Glucose reference range applies only to samples taken after fasting for at least 8 hours.  Glucose, capillary     Status: Abnormal   Collection Time: 10/03/20  5:52 AM  Result Value Ref Range   Glucose-Capillary 182 (H) 70 - 99 mg/dL    Comment: Glucose reference range applies only to samples taken after fasting for at least 8 hours.   Comment 1 Notify RN   Glucose, capillary     Status: Abnormal   Collection Time: 10/03/20  8:27 AM  Result Value Ref Range   Glucose-Capillary 161 (H) 70 - 99 mg/dL    Comment: Glucose reference range applies only to samples taken after fasting for at least 8 hours.  Glucose, capillary     Status: Abnormal   Collection Time: 10/03/20 11:58 AM  Result Value Ref Range   Glucose-Capillary 48 (L) 70 - 99 mg/dL    Comment: Glucose reference range applies only to samples taken after fasting for at least 8 hours.  Glucose, capillary     Status: None   Collection Time: 10/03/20 12:17 PM  Result Value Ref Range   Glucose-Capillary 89 70 - 99 mg/dL    Comment: Glucose reference range applies only to  samples taken after fasting for at least 8 hours.  Glucose, capillary     Status: Abnormal   Collection Time: 10/03/20  5:09 PM  Result Value Ref  Range   Glucose-Capillary 463 (H) 70 - 99 mg/dL    Comment: Glucose reference range applies only to samples taken after fasting for at least 8 hours.  Glucose, capillary     Status: Abnormal   Collection Time: 10/03/20  8:46 PM  Result Value Ref Range   Glucose-Capillary 402 (H) 70 - 99 mg/dL    Comment: Glucose reference range applies only to samples taken after fasting for at least 8 hours.  Glucose, capillary     Status: Abnormal   Collection Time: 10/03/20 11:53 PM  Result Value Ref Range   Glucose-Capillary 185 (H) 70 - 99 mg/dL    Comment: Glucose reference range applies only to samples taken after fasting for at least 8 hours.  Glucose, capillary     Status: Abnormal   Collection Time: 10/04/20  6:29 AM  Result Value Ref Range   Glucose-Capillary 58 (L) 70 - 99 mg/dL    Comment: Glucose reference range applies only to samples taken after fasting for at least 8 hours.   Comment 1 Notify RN    Comment 2 Document in Chart   Glucose, capillary     Status: Abnormal   Collection Time: 10/04/20  6:45 AM  Result Value Ref Range   Glucose-Capillary 66 (L) 70 - 99 mg/dL    Comment: Glucose reference range applies only to samples taken after fasting for at least 8 hours.   Comment 1 Notify RN   Glucose, capillary     Status: None   Collection Time: 10/04/20  6:51 AM  Result Value Ref Range   Glucose-Capillary 78 70 - 99 mg/dL    Comment: Glucose reference range applies only to samples taken after fasting for at least 8 hours.  Glucose, capillary     Status: Abnormal   Collection Time: 10/04/20 11:56 AM  Result Value Ref Range   Glucose-Capillary 270 (H) 70 - 99 mg/dL    Comment: Glucose reference range applies only to samples taken after fasting for at least 8 hours.  Glucose, capillary     Status: Abnormal   Collection Time: 10/04/20  5:09 PM  Result Value Ref Range   Glucose-Capillary 253 (H) 70 - 99 mg/dL    Comment: Glucose reference range applies only to samples taken after  fasting for at least 8 hours.    Blood Alcohol level:  Lab Results  Component Value Date   ETH <10 09/24/2020   ETH <10 02/09/2020    Metabolic Disorder Labs: Lab Results  Component Value Date   HGBA1C 9.7 (H) 09/09/2020   MPG 231.69 09/09/2020   MPG 205.86 02/11/2020   Lab Results  Component Value Date   PROLACTIN 6.0 09/24/2020   Lab Results  Component Value Date   CHOL 187 09/24/2020   TRIG 50 09/24/2020   HDL 100 09/24/2020   CHOLHDL 1.9 09/24/2020   VLDL 10 09/24/2020   LDLCALC 77 09/24/2020   LDLCALC 64 11/22/2019    Physical Findings:  Musculoskeletal: Strength & Muscle Tone: within normal limits Gait & Station: normal Patient leans: N/A   Psychiatric Specialty Exam: Physical Exam Vitals and nursing note reviewed.  HENT:     Head: Normocephalic and atraumatic.  Pulmonary:     Effort: Pulmonary effort is normal.  Neurological:     General: No focal  deficit present.     Mental Status: He is alert and oriented to person, place, and time.    Review of Systems  Constitutional:  Negative for fever.  Respiratory:  Negative for shortness of breath.   Cardiovascular:  Negative for chest pain.  Gastrointestinal:  Negative for abdominal pain, constipation, nausea and vomiting.  Neurological:  Positive for headaches (Onset this morning that waxes and wane in severity, located in frontal region and below the eyebrows). Negative for dizziness.  Psychiatric/Behavioral:  Positive for dysphoric mood and sleep disturbance. Negative for hallucinations. The patient is nervous/anxious.    Blood pressure (!) 148/94, pulse 83, temperature 98.6 F (37 C), temperature source Oral, resp. rate 16, height 6\' 1"  (1.854 m), weight 93 kg, SpO2 100 %.Body mass index is 27.05 kg/m.  General Appearance: Casual and appropriate hygeine, not wearing vision glasses today  Eye Contact:  Poor  Speech: Mumbled, not clear, irritated tone, loud sigh and groans, normal rate, spontaneous   Volume:  Decreased  Mood:  dysphoric, irritable  Affect:  Congruent  Thought Process:  Coherent, Goal Directed, and Linear   Orientation:  Full (Time, Place, and Person)  Thought Content: Patient denied SI/HI/AVH, delusions, paranoia, first rank symptoms. Patient is not grossly responding to internal/external stimuli on exam and did not make delusional statements.  Suicidal Thoughts:  No  Homicidal Thoughts:  No  Memory:  Immediate;   Good Recent;   Fair Remote;   Fair  Judgement:  Fair  Insight:  Fair  Psychomotor Activity:  Normal   Concentration:  Concentration: Fair and Attention Span: Fair  Recall:  of Knowledge:  Good  Language:  Good  Akathisia:  NA  Assets:  Communication Skills Desire for Improvement Resilience  ADL's:  Intact  Cognition:  WNL  Sleep: 5.25    Treatment Plan Summary: Daily contact with patient to assess and evaluate symptoms and progress in treatment and Medication management  [name] is a 35 y.o. adult, with reported past psychiatric history of MDD.  Past medical history of poorly controlled diabetes, HIV, hypertension.  Patient presents involuntarily to [facility] (09/25/2020), transferred from [facility] (09/24/2020) for SI daily for the last 5 to 10 years. [facility] stay day 9.     ASSESSMENT: Diagnosis:   - MDD (major depressive disorder), recurrent severe, without psychosis  - Diabetes mellitus type 1   - HIV infection  - Essential hypertension  PSYCHIATRIC DIAGNOSIS & TREATMENT:  MDD - Continue Zoloft 50 mg p.o. daily - Continue Glucerna shake liquid 237 mL p.o. 3 times daily between meals for decreased appetite due to depression - Continue Trileptal 150 mg p.o. twice daily for mood stability and impulse control (monitoring Na+ with start of medication) r/b/se/a to med discussed with patient .  Repeat CMP tonight. -- placed on room lock out for meals and groups   MEDICAL MANAGEMENT: Hypertension -  Continue amlodipine 10 mg p.o. daily - Start hydralazine 25 mg p.o. 3 times daily - Scheduled CMP tonight to assess for kidney function  Diabetes - Continue sliding scale  Lantus 15 units IM nightly  Stopped mealtime insulin due to hypoglycemic events. Continue monitoring CBGs 4 times daily - Inpatient diabetes coordinator on board  HIV - Continue Genvoya 1 tablet daily with breakfast  Elevated Creatinine (1.50) - RESOLVED - Cr 1.23 (10/01/2020), continue to encourage p.o. hydration and rechecking CMP tonight  Hyponatremia (Na+ 133) and Hyperkalemia (K+ 5.3) on admission- RESOLVED - Most recent  BMP shows Na+ 137 and K+ 4.2 on 10/01/20 - Will monitor CMP tonight. - EKG: Vent rate 109, QTc 436, otherwise wnl (09/30/2020)  PRN's -Trazodone 50mg  PO qHS PRN for insomnia -Alum & mag hydroxide-simeth 30ml PO qHS PRN for GERD -Magnesium hydroxide 30ml PO daily PRN for constipation -Acetaminophen tablet 650mg  PO PRN q6hrs for mild pain  -Clonidine 0.1 mg p.o. 3 times daily as needed for SBP >150 -Zofran 8 mg p.o. every 8 hours as needed for nausea and vomiting  Discharge Planning: -Social work and case management to assist with discharge planning and identification of hospital follow-up needs prior to discharge - patient now wants to stay with mother at discharge and is considering [facility] -Estimated LOS: 2-3 days -Discharge Concerns: Need to establish a safety plan; Medication compliance and effectiveness -Discharge Goals: Return home with outpatient referrals for mental health follow-up including medication management/psychotherapy  Safety and Monitoring: -Involuntary admission to inpatient psychiatric unit for safety, stabilization and treatment -Daily contact with patient to assess and evaluate symptoms and progress in treatment -Patient's case to be discussed in multi-disciplinary team meeting -Observation Level : q15 minute checks -Vital signs: q12 hours -Precautions: suicide,  elopement, and assault   Princess BruinsJulie Nguyen, DO, PGY-1 10/04/2020, 5:20 PM

## 2020-10-04 NOTE — Progress Notes (Addendum)
Inpatient Diabetes Program Recommendations  AACE/ADA: New Consensus Statement on Inpatient Glycemic Control (2015)  Target Ranges:  Prepandial:   less than 140 mg/dL      Peak postprandial:   less than 180 mg/dL (1-2 hours)      Critically ill patients:  140 - 180 mg/dL   Results for [name], [name] (MRN [id number]) as of 10/04/2020 10:45  Ref. Range 10/03/2020 05:52 10/03/2020 08:27 10/03/2020 11:58 10/03/2020 12:17 10/03/2020 17:09 10/03/2020 20:46 10/03/2020 23:53  Glucose-Capillary Latest Ref Range: 70 - 99 mg/dL 253 (H) 664 (H)  6 units NOVOLOG @0720  48 (L) 89 463 (H)  6 units NOVOLOG  402 (H)  6 units NOVOLOG @2158   15 units Lantus @2158  185 (H)   Results for [name], [name] (MRN ) as of 10/04/2020 10:45  Ref. Range 10/04/2020 06:29 10/04/2020 06:45 10/04/2020 06:51  Glucose-Capillary Latest Ref Range: 70 - 99 mg/dL 58 (L) 66 (L) 78     Home DM Meds: Lantus 26 units daily (however patient appears to be taking more)    Novolog 4 units tid with meals  Current Orders: Lantus 15 units QHS      Novolog 0-5 units QHS    MD- Note Hypoglycemic events.  Note that Novolog Correction scale TID with meals and Novolog 5 units TID with meals (for meal coverage) both stopped  From the pattern of CBGs I am reviewing, it appears pt is very sensitive to rapid-acting insulin.  However, when the Novolog is held, his CBGs rise sharply due to food intake.  Please consider:  1. Reduce Lantus to 12 units QHS  2. Restart Novolog 0-6 units (very sensitive scale) TID AC  3. Restart Novolog Meal Coverage but at lower dose with Hold parameters Novolog 3 units TID with meals HOLD if pt eats less than 50% of meal HOLD if pt NPO HOLD if pre-meal CBG <80 mg/dl     --Will follow patient during hospitalization--  12/04/2020 RN, MSN, CDE Diabetes Coordinator Inpatient Glycemic Control Team Team Pager: [phone] (8a-5p)

## 2020-10-04 NOTE — Progress Notes (Signed)
Pt has been alert and oriented to person, place, time and situation. Pt is calm, isolative in his room in his bed, refused to come out of his room until after 9am and refused to take AM meds earlier when they were scheduled, despite MHT and RN attempts to request pt take his medications. Pt is resistant to care, or to follow unit routines, makes poor eye contact, does not wish to elaborate during the nursing shift assessment beyond one word answers, is easily irritable. Pt denies suicidal and homicidal ideation, denies hallucinations, denies feelings of depression and anxiety. Pt reluctantly allowed staff to take his manual BP and CBG checks but makes verbal complaints about these tasks and is resistant, requires a great deal of encouragement to comply with them. Pt's appetite is good. Will continue to monitor pt per Q15 minute face checks and monitor for safety and progress.

## 2020-10-04 NOTE — Consult Note (Addendum)
Medical Consultation  [name] JGG:[id number] DOB: December 22, 1985 DOA: 09/25/2020 PCP: Patient, No Pcp Per (Inactive)   Requesting physician: Dr. Nelda Marseille Date of consultation: 10/04/20 Reason for consultation: HTN, DM  Impression/Recommendations HTN    - continue amlodipine    - add hydralazine 25 mg TID    - check BMP; if renal function is stable, can consider transition from hydralazine to ARB long term  DM2 Hyperglycemia     - check BMP     - labile glucose that will be quite difficult to control if his diet can't be controlled; there is little ability to place on a carb-controlled diet in that facility     - DM coordinator is onboard; would defer to their recommendations for insulin adjustments; see their 10/04/20 10:45a note      TRH will review chart periodically. Call with questions.   Chief Complaint: Suicidal ideation  HPI:  [name] is a 35 y.o. adult with medical history significant of MDD.  As per [facility] H&P: Patient is seen and examined.  Patient is a 35 year old male with a past psychiatric history significant for major depression and is well past medical history significant for poorly controlled diabetes mellitus, HIV, hypertension and recently admitted suicidal ideation.  The patient presented to the behavioral health urgent care center on 09/24/2020.  The patient was accompanied by his infectious disease clinic counselor.  The patient endorsed suicidal ideation with thoughts approximately every day for the last 5 to 10 years.  He had stated that in the past he had had a few suicide attempt by intentionally overdosing on insulin, but had been found by his roommate.  He would not provide any additional information.  He stated that things have gotten worse lately because the world sucks.  TRH was consulted for HTN and DM recommendations. He is normally on amlodipine $RemoveBefor'10mg'GMcchInxnduE$  qday. This has been resumed; however, he continues to have elevated BP. He has been  given PRN clonidine w/o permanent relief. In addition, he has had significant swings on his glucose control. There is a question of his compliance at baseline. He is currently on SSI and lantus 15.   Past Medical History:  Diagnosis Date   Anxiety    Asthma    no prior hospitalizations, intubations   Depression    Headache    "only w/stress" (11/01/2017)   HIV (human immunodeficiency virus infection) ([place])    Migraine    "a few/month" (11/01/2017)   Refusal of blood transfusions as patient is Jehovah's Witness    Seizure ([place])    "only w/my low blood sugars"  (11/01/2017)   Type I diabetes mellitus ([place])    Past Surgical History:  Procedure Laterality Date   FRACTURE SURGERY     IM NAILING TIBIA Right 11/01/2017   INTRAMEDULLARY (IM) NAIL TIBIALRightGeneral   ORIF ANKLE FRACTURE Right 11/01/2017   ORIF ANKLE FRACTURE Right 11/01/2017   Procedure: OPEN REDUCTION INTERNAL FIXATION (ORIF) ANKLE FRACTURE;  Surgeon: Shona Needles, MD;  Location: [place];  Service: Orthopedics;  Laterality: Right;   TIBIA IM NAIL INSERTION Right 11/01/2017   Procedure: INTRAMEDULLARY (IM) NAIL TIBIAL;  Surgeon: Shona Needles, MD;  Location: [place];  Service: Orthopedics;  Laterality: Right;   Social History:  reports that he has been smoking cigars. He started smoking about 5 years ago. He has never used smokeless tobacco. He reports current alcohol use of about 7.0 standard drinks of alcohol per week. He reports previous drug use.  Frequency: 2.00 times per week. Drug: Marijuana.  Allergies  Allergen Reactions   Shellfish Allergy Anaphylaxis and Hives   Aspirin Other (See Comments)    Unknown reaction   Family History  Problem Relation Age of Onset   Thyroid disease Mother    Diabetes Mother    Breast cancer Maternal Grandmother    Diabetes Maternal Grandmother     Prior to Admission medications   Medication Sig Start Date End Date Taking? Authorizing Provider  amLODipine (NORVASC) 5 MG tablet Take 5  mg by mouth daily.    [provider]  blood glucose meter kit and supplies Dispense based on patient and insurance preference. Use up to four times daily as directed. (FOR ICD-10 E10.9, E11.9). Patient taking differently: 1 each by Other route See admin instructions. Dispense based on patient and insurance preference. Use up to four times daily as directed. (FOR ICD-10 E10.9, E11.9). 02/11/20   Domenic Polite, MD  Blood Glucose Monitoring Suppl (TRUE METRIX METER) w/Device KIT USE AS DIRECTED Patient taking differently: 1 each by Other route as directed. 02/11/20 02/10/21  Domenic Polite, MD  citalopram (CELEXA) 20 MG tablet TAKE 1 TABLET BY MOUTH DAILY 09/22/20   Tommy Medal, Lavell Islam, MD  elvitegravir-cobicistat-emtricitabine-tenofovir [facility]) 150-150-200-10 MG TABS tablet Take 1 tablet by mouth daily with breakfast. 05/04/20   Mignon Pine, DO  glucose blood test strip USE AS DIRECTED Patient taking differently: 1 each by Other route as directed. 02/11/20 02/10/21  Domenic Polite, MD  insulin aspart (NOVOLOG) 100 UNIT/ML injection INJECT 5 UNITS INTO THE SKIN THREE TIMES DAILY WITH MEALS. Patient taking differently: Inject 4 Units into the skin 3 (three) times daily with meals. 02/11/20 02/10/21  Domenic Polite, MD  insulin glargine (LANTUS) 100 UNIT/ML injection INJECT 26 UNITS INTO THE SKIN DAILY. Patient taking differently: Inject 35 Units into the skin daily. Note from 7/13 hospialization for hypoglycemia MD wanted to decrease Lantus to 18 U daily.  Pt reported to me he took 35 U bid. So unclear what does he should be on vs what he takes. 02/11/20 02/10/21  Domenic Polite, MD  Insulin Syringe-Needle U-100 30G X 1/2" 0.5 ML MISC USE AS DIRECTED WITH INSULIN Patient taking differently: 1 each by Other route See admin instructions. with insulin 02/11/20 02/10/21  Domenic Polite, MD  TRUEplus Lancets 28G MISC USE AS DIRECTED Patient taking differently: 1 each by Other route as  directed. 02/11/20 02/10/21  Domenic Polite, MD  insulin lispro (HUMALOG KWIKPEN) 100 UNIT/ML KwikPen Inject 0.1 mLs (10 Units total) into the skin 3 (three) times daily with meals. ICD 10 E10.42 Patient not taking: Reported on 03/31/2019 03/08/19 07/08/19  Gildardo Pounds, NP  pravastatin (PRAVACHOL) 20 MG tablet Take 1 tablet (20 mg total) by mouth daily. Patient not taking: Reported on 03/31/2019 03/08/19 07/08/19  Gildardo Pounds, NP   Physical Exam: Blood pressure (!) 148/102, pulse 85, temperature 98.6 F (37 C), temperature source Oral, resp. rate 16, height $RemoveBe'6\' 1"'OHnpaFscM$  (1.854 m), weight 93 kg, SpO2 100 %. Vitals:   10/04/20 1154 10/04/20 1200  BP: (!) 147/99 (!) 148/102  Pulse: 85   Resp:    Temp:    SpO2:      No physical exam  Labs on Admission:  Basic Metabolic Panel: Recent Labs  Lab 09/29/20 1853 10/01/20 0631  NA 135 137  K 4.6 4.2  CL 101 104  CO2 26 23  GLUCOSE 203* 149*  BUN 23* 22*  CREATININE 1.41*  1.23  CALCIUM 9.7 9.7   Liver Function Tests: No results for input(s): AST, ALT, ALKPHOS, BILITOT, PROT, ALBUMIN in the last 168 hours. No results for input(s): LIPASE, AMYLASE in the last 168 hours. No results for input(s): AMMONIA in the last 168 hours. CBC: No results for input(s): WBC, NEUTROABS, HGB, HCT, MCV, PLT in the last 168 hours. Cardiac Enzymes: No results for input(s): CKTOTAL, CKMB, CKMBINDEX, TROPONINI in the last 168 hours. BNP: Invalid input(s): POCBNP CBG: Recent Labs  Lab 10/03/20 2353 10/04/20 0629 10/04/20 0645 10/04/20 0651 10/04/20 1156  GLUCAP 185* 58* 66* 78 270*    Radiological Exams on Admission: No results found.  Time spent: 15 minutes  [facility]  If 7PM-7AM, please contact night-coverage [email or web address] 10/04/2020, 2:02 PM

## 2020-10-04 NOTE — [facility] Group Notes (Signed)
[facility] Group Notes:  (Nursing/MHT/Case Management/Adjunct)  Date:  10/04/2020  Time:  10:54 AM  Type of Therapy:   The focus of this group is to help patients establish daily goals to achieve during treatment and discuss how the patient can incorporate goal setting into their daily lives to aide in recovery.    Participation Level:  Pt did not participate in group activity.  Participation Quality:    Affect:  Flat  Cognitive:    Insight:    Engagement in Group:    Modes of Intervention:  Discussion, Education, Rapport Building, and Support  Summary of Progress/Problems: Pt left room before starting group, he did not participate.  Karren Burly 10/04/2020, 10:54 AM

## 2020-10-04 NOTE — Progress Notes (Signed)
Hypoglycemic Event  CBG: 58  Treatment: Graham crackers, peanut butter, milk  Symptoms: None  Follow-up CBG: Time:6:45 CBG Result:66  Follow-up CBG: Time:   6:55 CBG Result: 78  Possible Reasons for Event: Inadequate meal intake, Medication regimen:  , and Unknown  Comments/MD notified: continue to monitor    Delos Haring

## 2020-10-04 NOTE — Progress Notes (Signed)
Pt continues to be medically compliant but verbally states not in agreement with provider decision for medication management.  Pt complied with scheduled medications. Pt currently denies SI/ HI/AVH and pain to this writer during the shift. Pt interacts appropriately with others in the milieu. Pt verbally contracted for safety.   A: Support and encouragement was offered and accepted. Pt medications were administered per order. Safety rounds continued and maintained Q 15 throughout the shift.    R: Safety maintained. Will continue to monitor and assess.    10/04/20 2230  Psych Admission Type (Psych Patients Only)  Admission Status Involuntary  Psychosocial Assessment  Patient Complaints Anger;Irritability  Eye Contact Fair  Facial Expression Sad  Affect Sad  Speech Slow  Interaction Assertive  Motor Activity Slow  Appearance/Hygiene Unremarkable  Behavior Characteristics Appropriate to situation  Mood Angry  Thought Process  Coherency WDL  Content WDL  Delusions None reported or observed  Perception WDL  Hallucination None reported or observed  Judgment Poor  Confusion WDL  Danger to Self  Current suicidal ideation? Denies  Self-Injurious Behavior No self-injurious ideation or behavior indicators observed or expressed   Agreement Not to Harm Self Yes  Description of Agreement verbal  Danger to Others  Danger to Others None reported or observed

## 2020-10-04 NOTE — Progress Notes (Signed)
Adult Psychoeducational Group Note  Date:  10/04/2020 Time:  9:46 PM  Group Topic/Focus:  Wrap-Up Group:   The focus of this group is to help patients review their daily goal of treatment and discuss progress on daily workbooks.  Participation Level:  Active  Participation Quality:  Appropriate  Affect:  Appropriate  Cognitive:  Appropriate  Insight: Appropriate  Engagement in Group:  Engaged  Modes of Intervention:  Discussion  Additional Comments:  patient attend wrap up group  Charna Busman Long 10/04/2020, 9:46 PM

## 2020-10-05 ENCOUNTER — Encounter (HOSPITAL_COMMUNITY): Payer: Self-pay | Admitting: Family

## 2020-10-05 LAB — I-STAT CHEM 8, ED
BUN: 25 mg/dL — ABNORMAL HIGH (ref 6–20)
Calcium, Ion: 1.19 mmol/L (ref 1.15–1.40)
Chloride: 102 mmol/L (ref 98–111)
Creatinine, Ser: 1.4 mg/dL — ABNORMAL HIGH (ref 0.61–1.24)
Glucose, Bld: 62 mg/dL — ABNORMAL LOW (ref 70–99)
HCT: 50 % (ref 39.0–52.0)
Hemoglobin: 17 g/dL (ref 13.0–17.0)
Potassium: 3.8 mmol/L (ref 3.5–5.1)
Sodium: 139 mmol/L (ref 135–145)
TCO2: 28 mmol/L (ref 22–32)

## 2020-10-05 LAB — GLUCOSE, CAPILLARY
Glucose-Capillary: 335 mg/dL — ABNORMAL HIGH (ref 70–99)
Glucose-Capillary: 233 mg/dL — ABNORMAL HIGH (ref 70–99)
Glucose-Capillary: 360 mg/dL — ABNORMAL HIGH (ref 70–99)
Glucose-Capillary: 397 mg/dL — ABNORMAL HIGH (ref 70–99)
Glucose-Capillary: 272 mg/dL — ABNORMAL HIGH (ref 70–99)

## 2020-10-05 LAB — CBG MONITORING, ED: Glucose-Capillary: 173 mg/dL — ABNORMAL HIGH (ref 70–99)

## 2020-10-05 MED ORDER — BUSPIRONE HCL 10 MG PO TABS
10.0000 mg | ORAL_TABLET | Freq: Two times a day (BID) | ORAL | Status: DC
  Administered 2020-10-05: 10 mg via ORAL
  Filled 2020-10-05 (×4): qty 1

## 2020-10-05 MED ORDER — INSULIN ASPART 100 UNIT/ML IJ SOLN
0.0000 [IU] | Freq: Three times a day (TID) | INTRAMUSCULAR | Status: DC
  Administered 2020-10-05: 9 [IU] via SUBCUTANEOUS
  Administered 2020-10-06: 5 [IU] via SUBCUTANEOUS
  Administered 2020-10-06: 3 [IU] via SUBCUTANEOUS
  Administered 2020-10-06 – 2020-10-07 (×2): 7 [IU] via SUBCUTANEOUS
  Administered 2020-10-07: 3 [IU] via SUBCUTANEOUS
  Administered 2020-10-08: 5 [IU] via SUBCUTANEOUS
  Filled 2020-10-05: qty 0.09

## 2020-10-05 MED ORDER — INSULIN ASPART 100 UNIT/ML IJ SOLN
3.0000 [IU] | Freq: Three times a day (TID) | INTRAMUSCULAR | Status: DC
  Administered 2020-10-05 – 2020-10-08 (×9): 3 [IU] via SUBCUTANEOUS
  Filled 2020-10-05: qty 0.03

## 2020-10-05 NOTE — Progress Notes (Addendum)
D: patient brought back from dinner, sweating bp. 142/102, p. 105 BG. [email or web address] of nausea. Zofran given.CBG 272. Pt. Non specific chest pain SOB. Dr(s). Notified and present for care. EMS called by charge nurse.

## 2020-10-05 NOTE — [facility] Group Notes (Signed)
Occupational Therapy Group Note Date: 10/05/2020 Group Topic/Focus: Coping Skills  Group Description: Group Description: Group encouraged increased engagement and participation through discussion focused on coping through music. Group members were encouraged to create a "coping skills playlist" that consisted of 20 songs with different cited categories/topics including "a song that reminds you of a good memory" and "a song that makes you want to dance", etc. Discussion followed with patients sharing their playlists and choosing one for the group to listen and respond too.   Therapeutic Goal(s): Identify positive songs to improve coping through music Identify and share benefit of music as a coping strategy  Participation Level: Active   Participation Quality: Independent   Behavior: Calm, Cooperative, and Interactive   Speech/Thought Process: Focused   Affect/Mood: Euthymic   Insight: Fair   Judgement: Fair   Individualization: [name] was active in their participation of group discussion/activity. Pt identified benefit of music as a coping skill "it helps to drown everything else out".   Modes of Intervention: Activity, Discussion, and Education  Patient Response to Interventions:  Attentive, Engaged, and Interested   Plan: Continue to engage patient in OT groups 2 - 3x/week.  10/05/2020  Donne Hazel, MOT, OTR/L

## 2020-10-05 NOTE — ED Provider Notes (Signed)
[facility] DEPT Provider Note   CSN: [id number] Arrival date & time: 10/05/20  1845     History Chief Complaint  Patient presents with   MDD-IVC    [name] is a 35 y.o. adult.  HPI He presents for evaluation of multiple symptoms after taking BuSpar at 5:30 PM tonight.  This is a new medicine for him.  He is at the [facility], currently, being treated for depression and suicidal ideation.  Initially he noticed nausea and was given Zofran at around 5:55 PM.  Later he developed a sensation of feeling hot, and dizzy with racing heartbeat.  Blood sugar checked by caregivers and it was elevated at 272.  He was diaphoretic at that time.  This was apparently after eating.  He was started on low-dose hydralazine, yesterday.  Patient reports some of his symptoms have improved since that time.  He feels like his blood sugar is not causing the problem.  He does not recall ever taking BuSpar previously.  There are no other known active modifying factors.    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   Major depressive disorder, single episode, severe ([place])    Alcohol intoxication with moderate or severe use disorder ([place])    Cannabis use disorder, moderate, dependence ([place])    Hypoglycemia 09/09/2020   Aggressive behavior    Altered mental status 02/09/2020   Suicidal ideation 02/09/2020    MDD (major depressive disorder), recurrent episode, severe ([place]) 01/15/2020   SIRS (systemic inflammatory response syndrome) ([place])    Healthcare maintenance 12/05/2019   Hypoglycemia due to insulin 03/31/2019   Syphilis 01/15/2019   Closed fracture of base of fifth metatarsal bone of right foot 11/13/2017   Closed displaced comminuted fracture of shaft of right tibia 11/01/2017   Closed fracture of medial malleolus of right ankle 10/31/2017   Vaccine counseling 12/14/2016   Screening examination for venereal disease 06/09/2015   Encounter for long-term (current) use of medications 06/09/2015   Depression 03/04/2015   Dizziness and giddiness 03/04/2015   Tobacco dependence 11/12/2014   HIV infection ([place]) 11/11/2014   Stress 12/19/2012   Asthma 10/02/2010   Diabetes mellitus type 1 ([place]) 03/01/1991    Past Surgical History:  Procedure Laterality Date   FRACTURE SURGERY     IM NAILING TIBIA Right 11/01/2017   INTRAMEDULLARY (IM) NAIL TIBIALRightGeneral   ORIF ANKLE FRACTURE Right 11/01/2017   ORIF ANKLE FRACTURE Right 11/01/2017   Procedure: OPEN REDUCTION INTERNAL FIXATION (ORIF) ANKLE FRACTURE;  Surgeon: Shona Needles, MD;  Location: [facility];  Service: Orthopedics;  Laterality: Right;   TIBIA IM NAIL INSERTION Right 11/01/2017   Procedure: INTRAMEDULLARY (IM) NAIL TIBIAL;  Surgeon: Shona Needles, MD;  Location: [facility];  Service: Orthopedics;  Laterality: Right;     OB History   No obstetric history on file.  Family History  Problem Relation Age of Onset   Thyroid disease Mother    Diabetes Mother    Breast cancer Maternal Grandmother    Diabetes Maternal Grandmother     Social History   Tobacco Use   Smoking status: Every Day    Types: Cigars    Start date: 09/29/2015   Smokeless tobacco: Never   Tobacco comments:    11/01/2017  "2 cigarettes in ~ 6 month "  Vaping Use   Vaping Use: Never used  Substance Use Topics   Alcohol use: Yes    Alcohol/week: 7.0 standard  drinks    Types: 7 Cans of beer per week   Drug use: Not Currently    Frequency: 2.0 times per week    Types: Marijuana    Comment: 11/01/2017  "weekly"    Home Medications Prior to Admission medications   Medication Sig Start Date End Date Taking? Authorizing Provider  amLODipine (NORVASC) 5 MG tablet Take 5 mg by mouth daily.    [provider]  blood glucose meter kit and supplies Dispense based on patient and insurance preference. Use up to four times daily as directed. (FOR ICD-10 E10.9, E11.9). Patient taking differently: 1 each by Other route See admin instructions. Dispense based on patient and insurance preference. Use up to four times daily as directed. (FOR ICD-10 E10.9, E11.9). 02/11/20   Domenic Polite, MD  Blood Glucose Monitoring Suppl (TRUE METRIX METER) w/Device KIT USE AS DIRECTED Patient taking differently: 1 each by Other route as directed. 02/11/20 02/10/21  Domenic Polite, MD  citalopram (CELEXA) 20 MG tablet TAKE 1 TABLET BY MOUTH DAILY 09/22/20   Tommy Medal, Lavell Islam, MD  elvitegravir-cobicistat-emtricitabine-tenofovir [facility]) 150-150-200-10 MG TABS tablet Take 1 tablet by mouth daily with breakfast. 05/04/20   Mignon Pine, DO  glucose blood test strip USE AS DIRECTED Patient taking differently: 1 each by Other route as directed. 02/11/20 02/10/21  Domenic Polite, MD  insulin aspart (NOVOLOG) 100 UNIT/ML injection INJECT 5 UNITS INTO THE SKIN THREE TIMES DAILY WITH MEALS. Patient taking differently: Inject 4 Units into the skin 3 (three) times daily with meals. 02/11/20 02/10/21  Domenic Polite, MD  insulin glargine (LANTUS) 100 UNIT/ML injection INJECT 26 UNITS INTO THE SKIN DAILY. Patient taking differently: Inject 35 Units into the skin daily. Note from 7/13 hospialization for hypoglycemia MD wanted to decrease Lantus to 18 U daily.  Pt reported to me he took 35 U bid. So unclear what does he should be on vs what he takes. 02/11/20 02/10/21  Domenic Polite, MD  Insulin Syringe-Needle U-100 30G X 1/2" 0.5 ML MISC USE AS DIRECTED WITH INSULIN Patient taking differently: 1 each by Other route See admin instructions. with insulin 02/11/20 02/10/21  Domenic Polite, MD  TRUEplus Lancets 28G MISC USE AS DIRECTED Patient taking differently: 1 each by Other route as directed. 02/11/20 02/10/21  Domenic Polite, MD  insulin lispro (HUMALOG KWIKPEN) 100 UNIT/ML KwikPen Inject 0.1 mLs (10 Units total) into the skin 3 (three) times daily with meals. ICD 10 E10.42 Patient not taking: Reported on 03/31/2019 03/08/19 07/08/19  Gildardo Pounds, NP  pravastatin (PRAVACHOL) 20 MG tablet Take 1 tablet (20 mg total) by mouth daily. Patient not taking: Reported on 03/31/2019 03/08/19 07/08/19  Gildardo Pounds, NP    Allergies    Shellfish allergy and Aspirin  Review of Systems   Review of Systems  All other systems reviewed and are negative.  Physical Exam Updated  Vital Signs BP (!) 148/103 (BP Location: Right Arm)   Pulse 87   Temp 97.8 F (36.6 C) (Oral)   Resp 14   Ht 6' (1.829 m)   Wt 93 kg   SpO2 100%   BMI 27.80 kg/m   Physical Exam Vitals and nursing note reviewed.  Constitutional:      General: He is not in acute distress.    Appearance: He is well-developed. He is not ill-appearing, toxic-appearing or diaphoretic.  HENT:     Head: Normocephalic and atraumatic.     Right Ear: External ear normal.     Left Ear: External ear normal.  Eyes:     Conjunctiva/sclera: Conjunctivae normal.     Pupils: Pupils are equal, round, and reactive to light.  Neck:     Trachea: Phonation normal.  Cardiovascular:     Rate and Rhythm: Normal rate.     Comments: Normal right radial pulse Pulmonary:     Effort: Pulmonary effort is normal.  Abdominal:     Palpations: Abdomen is soft.     Tenderness: There is no abdominal tenderness.  Musculoskeletal:        General: Normal range of motion.     Cervical back: Normal range of motion and neck supple.   Skin:    General: Skin is warm and dry.  Neurological:     Mental Status: He is alert and oriented to person, place, and time.     Cranial Nerves: No cranial nerve deficit.     Sensory: No sensory deficit.     Motor: No abnormal muscle tone.     Coordination: Coordination normal.     Comments: No dysarthria, aphasia or nystagmus  Psychiatric:        Mood and Affect: Mood normal.        Behavior: Behavior normal.        Thought Content: Thought content normal.        Judgment: Judgment normal.    ED Results / Procedures / Treatments   Labs (all labs ordered are listed, but only abnormal results are displayed) Labs Reviewed  GLUCOSE, CAPILLARY - Abnormal; Notable for the following components:      Result Value   Glucose-Capillary 230 (*)    All other components within normal limits  GLUCOSE, CAPILLARY - Abnormal; Notable for the following components:   Glucose-Capillary 290 (*)    All other components within normal limits  GLUCOSE, CAPILLARY - Abnormal; Notable for the following components:   Glucose-Capillary 61 (*)    All other components within normal limits  GLUCOSE, CAPILLARY - Abnormal; Notable for the following components:   Glucose-Capillary 66 (*)    All other components within normal limits  AMYLASE - Abnormal; Notable for the following components:   Amylase 111 (*)    All other components within normal limits  MICROALBUMIN, URINE - Abnormal; Notable for the following components:   Microalb, Ur 249.0 (*)    All other components within normal limits  GLUCOSE, CAPILLARY - Abnormal; Notable for the following components:   Glucose-Capillary 137 (*)    All other components within normal limits  GLUCOSE, CAPILLARY - Abnormal; Notable for the following components:   Glucose-Capillary 282 (*)    All other components within normal limits  GLUCOSE, CAPILLARY - Abnormal; Notable for the following components:   Glucose-Capillary 354 (*)    All other components within  normal limits  GLUCOSE, CAPILLARY - Abnormal; Notable for the following components:   Glucose-Capillary  51 (*)    All other components within normal limits  GLUCOSE, CAPILLARY - Abnormal; Notable for the following components:   Glucose-Capillary 342 (*)    All other components within normal limits  GLUCOSE, CAPILLARY - Abnormal; Notable for the following components:   Glucose-Capillary 228 (*)    All other components within normal limits  GLUCOSE, CAPILLARY - Abnormal; Notable for the following components:   Glucose-Capillary 167 (*)    All other components within normal limits  GLUCOSE, CAPILLARY - Abnormal; Notable for the following components:   Glucose-Capillary 347 (*)    All other components within normal limits  GLUCOSE, CAPILLARY - Abnormal; Notable for the following components:   Glucose-Capillary 140 (*)    All other components within normal limits  GLUCOSE, CAPILLARY - Abnormal; Notable for the following components:   Glucose-Capillary 373 (*)    All other components within normal limits  GLUCOSE, CAPILLARY - Abnormal; Notable for the following components:   Glucose-Capillary 382 (*)    All other components within normal limits  BASIC METABOLIC PANEL - Abnormal; Notable for the following components:   Glucose, Bld 203 (*)    BUN 23 (*)    Creatinine, Ser 1.41 (*)    All other components within normal limits  GLUCOSE, CAPILLARY - Abnormal; Notable for the following components:   Glucose-Capillary 148 (*)    All other components within normal limits  GLUCOSE, CAPILLARY - Abnormal; Notable for the following components:   Glucose-Capillary 53 (*)    All other components within normal limits  GLUCOSE, CAPILLARY - Abnormal; Notable for the following components:   Glucose-Capillary 292 (*)    All other components within normal limits  GLUCOSE, CAPILLARY - Abnormal; Notable for the following components:   Glucose-Capillary 300 (*)    All other components within normal  limits  GLUCOSE, CAPILLARY - Abnormal; Notable for the following components:   Glucose-Capillary 167 (*)    All other components within normal limits  GLUCOSE, CAPILLARY - Abnormal; Notable for the following components:   Glucose-Capillary 316 (*)    All other components within normal limits  BASIC METABOLIC PANEL - Abnormal; Notable for the following components:   Glucose, Bld 149 (*)    BUN 22 (*)    All other components within normal limits  GLUCOSE, CAPILLARY - Abnormal; Notable for the following components:   Glucose-Capillary 109 (*)    All other components within normal limits  GLUCOSE, CAPILLARY - Abnormal; Notable for the following components:   Glucose-Capillary 143 (*)    All other components within normal limits  GLUCOSE, CAPILLARY - Abnormal; Notable for the following components:   Glucose-Capillary 307 (*)    All other components within normal limits  GLUCOSE, CAPILLARY - Abnormal; Notable for the following components:   Glucose-Capillary 178 (*)    All other components within normal limits  GLUCOSE, CAPILLARY - Abnormal; Notable for the following components:   Glucose-Capillary 110 (*)    All other components within normal limits  GLUCOSE, CAPILLARY - Abnormal; Notable for the following components:   Glucose-Capillary 150 (*)    All other components within normal limits  GLUCOSE, CAPILLARY - Abnormal; Notable for the following components:   Glucose-Capillary 236 (*)    All other components within normal limits  GLUCOSE, CAPILLARY - Abnormal; Notable for the following components:   Glucose-Capillary 50 (*)    All other components within normal limits  GLUCOSE, CAPILLARY - Abnormal; Notable for the following components:   Glucose-Capillary  102 (*)    All other components within normal limits  GLUCOSE, CAPILLARY - Abnormal; Notable for the following components:   Glucose-Capillary 182 (*)    All other components within normal limits  GLUCOSE, CAPILLARY - Abnormal;  Notable for the following components:   Glucose-Capillary 161 (*)    All other components within normal limits  GLUCOSE, CAPILLARY - Abnormal; Notable for the following components:   Glucose-Capillary 48 (*)    All other components within normal limits  GLUCOSE, CAPILLARY - Abnormal; Notable for the following components:   Glucose-Capillary 463 (*)    All other components within normal limits  GLUCOSE, CAPILLARY - Abnormal; Notable for the following components:   Glucose-Capillary 402 (*)    All other components within normal limits  GLUCOSE, CAPILLARY - Abnormal; Notable for the following components:   Glucose-Capillary 185 (*)    All other components within normal limits  GLUCOSE, CAPILLARY - Abnormal; Notable for the following components:   Glucose-Capillary 58 (*)    All other components within normal limits  GLUCOSE, CAPILLARY - Abnormal; Notable for the following components:   Glucose-Capillary 66 (*)    All other components within normal limits  COMPREHENSIVE METABOLIC PANEL - Abnormal; Notable for the following components:   Sodium 133 (*)    Glucose, Bld 364 (*)    BUN 22 (*)    Creatinine, Ser 1.44 (*)    All other components within normal limits  GLUCOSE, CAPILLARY - Abnormal; Notable for the following components:   Glucose-Capillary 270 (*)    All other components within normal limits  GLUCOSE, CAPILLARY - Abnormal; Notable for the following components:   Glucose-Capillary 253 (*)    All other components within normal limits  GLUCOSE, CAPILLARY - Abnormal; Notable for the following components:   Glucose-Capillary 494 (*)    All other components within normal limits  GLUCOSE, CAPILLARY - Abnormal; Notable for the following components:   Glucose-Capillary 453 (*)    All other components within normal limits  GLUCOSE, CAPILLARY - Abnormal; Notable for the following components:   Glucose-Capillary 335 (*)    All other components within normal limits  GLUCOSE,  CAPILLARY - Abnormal; Notable for the following components:   Glucose-Capillary 233 (*)    All other components within normal limits  GLUCOSE, CAPILLARY - Abnormal; Notable for the following components:   Glucose-Capillary 360 (*)    All other components within normal limits  GLUCOSE, CAPILLARY - Abnormal; Notable for the following components:   Glucose-Capillary 397 (*)    All other components within normal limits  GLUCOSE, CAPILLARY - Abnormal; Notable for the following components:   Glucose-Capillary 272 (*)    All other components within normal limits  CBG MONITORING, ED - Abnormal; Notable for the following components:   Glucose-Capillary 173 (*)    All other components within normal limits  LIPASE, BLOOD  GLUCOSE, CAPILLARY  GLUCOSE, CAPILLARY  GLUCOSE, CAPILLARY  GLUCOSE, CAPILLARY  BASIC METABOLIC PANEL    EKG None  Radiology No results found.  Procedures Procedures   Medications Ordered in ED Medications  acetaminophen (TYLENOL) tablet 650 mg (650 mg Oral Given 10/04/20 1527)  alum & mag hydroxide-simeth (MAALOX/MYLANTA) 200-200-20 MG/5ML suspension 30 mL (has no administration in time range)  magnesium hydroxide (MILK OF MAGNESIA) suspension 30 mL (has no administration in time range)  traZODone (DESYREL) tablet 50 mg (50 mg Oral Given 10/04/20 2106)  insulin aspart (novoLOG) injection 0-5 Units (5 Units Subcutaneous Given 10/04/20 2100)  elvitegravir-cobicistat-emtricitabine-tenofovir (GENVOYA) 150-150-200-10 MG tablet 1 tablet (1 tablet Oral Given 10/05/20 0938)  amLODipine (NORVASC) tablet 10 mg (10 mg Oral Given 10/05/20 0937)  ondansetron (ZOFRAN-ODT) disintegrating tablet 8 mg (8 mg Oral Given 10/05/20 1803)  insulin glargine (LANTUS) injection 15 Units (15 Units Subcutaneous Given 10/04/20 2100)  feeding supplement (GLUCERNA SHAKE) (GLUCERNA SHAKE) liquid 237 mL (237 mLs Oral Not Given 10/05/20 1410)  cloNIDine (CATAPRES) tablet 0.1 mg (0.1 mg Oral Given 10/01/20 1531)   sertraline (ZOLOFT) tablet 50 mg (50 mg Oral Given 10/05/20 0937)  hydrALAZINE (APRESOLINE) tablet 25 mg (25 mg Oral Given 10/05/20 1412)  insulin aspart (novoLOG) injection 3 Units (3 Units Subcutaneous Given 10/05/20 1715)  insulin aspart (novoLOG) injection 0-9 Units (9 Units Subcutaneous Given 10/05/20 1717)  amLODipine (NORVASC) tablet 5 mg (5 mg Oral Given 09/26/20 1247)  insulin aspart (novoLOG) injection 6 Units (6 Units Subcutaneous Given 10/03/20 2158)    ED Course  I have reviewed the triage vital signs and the nursing notes.  Pertinent labs & imaging results that were available during my care of the patient were reviewed by me and considered in my medical decision making (see chart for details).    MDM Rules/Calculators/A&P                            Patient Vitals for the past 24 hrs:  BP Temp Temp src Pulse Resp SpO2 Height Weight  10/05/20 1858 (!) 148/103 97.8 F (36.6 C) Oral 87 14 100 % 6' (1.829 m) 93 kg  10/05/20 1808 126/85 98.4 F (36.9 C) Oral 94 -- 100 % -- --  10/05/20 1757 (!) 142/102 -- -- (!) 105 -- -- -- --  10/05/20 1407 (!) 138/93 -- -- (!) 104 -- -- -- --  10/05/20 1405 (!) 140/97 98.3 F (36.8 C) Oral (!) 103 -- 99 % -- --  10/05/20 0630 (!) 140/100 98.1 F (36.7 C) Oral 90 -- 100 % -- --  10/04/20 2221 (!) 140/95 -- -- 91 -- -- -- --    Medical Decision Making:  This patient is presenting for evaluation of Reaction to BuSpar, which does require a range of treatment options, and is a complaint that involves a moderate risk of morbidity and mortality. The differential diagnoses includeMedication intolerance, complications from multiple drug therapy, progressive psychiatric illness. I decided to review old records, and in summary Patient with history of diabetes, HIV disease and depression presenting after receiving BuSpar for the first time and developing symptoms shortly thereafter.  I Did not require additional historical information from  Anyone.  Clinical Laboratory Tests Ordered, included  i-STAT 8, CAG . Review indicates normal except BUN high, creatinine high, glucose low, following initial CBG of 173 at 6:57 PM.  Orthostatics- sitting 145/99 with HR 91 standing 143/102 HR 93  Critical Interventions-clinical evaluation, laboratory testing, observation and reassessment  After These Interventions, the Patient was reevaluated and was found clinically improved, with incidental hyperglycemia, following usual treatment and prolonged period of time since eating.  Doubt serious reaction to BuSpar.  This is an unusual reaction to BuSpar, and may indicate akathisia.  Patient is not require further ED intervention or hospitalization at this time.  CRITICAL CARE-no Performed by: Daleen Bo  Nursing Notes Reviewed/ Care Coordinated Applicable Imaging Reviewed Interpretation of Laboratory Data incorporated into ED treatment   Plan disposition, after CBG improves.    Final Clinical Impression(s) / ED Diagnoses Final diagnoses:  Medication  intolerance  Hypoglycemia    Rx / DC Orders ED Discharge Orders     None        Daleen Bo, MD 10/05/20 2354

## 2020-10-05 NOTE — [facility] Group Notes (Addendum)
Type of Therapy and Topic:  Group Therapy - Healthy vs Unhealthy Coping Skills  Participation Level:  Active   Description of Group The focus of this group was to determine what unhealthy coping techniques typically are used by group members and what healthy coping techniques would be helpful in coping with various problems. Patients were guided in becoming aware of the differences between healthy and unhealthy coping techniques. Patients were asked to identify 2-3 healthy coping skills they would like to learn to use more effectively.  Therapeutic Goals Patients learned that coping is what human beings do all day long to deal with various situations in their lives Patients defined and discussed healthy vs unhealthy coping techniques Patients identified their preferred coping techniques and identified whether these were healthy or unhealthy Patients determined 2-3 healthy coping skills they would like to become more familiar with and use more often. Patients provided support and ideas to each other   Summary of Patient Progress:  During group, [name] expressed that cooking is one of his coping skills. Patient proved open to input from peers and feedback from CSW. Patient demonstrated insight into the subject matter, was respectful of peers, and participated throughout the entire session.  The Patient discussed things that are in their control, out of their control, how they cope with those situations, and the possible positive and negative consequences that come with their actions.

## 2020-10-05 NOTE — Progress Notes (Signed)
   10/05/20 1808  Vital Signs  Temp 98.4 F (36.9 C)  Temp Source Oral  Pulse Rate 94  BP 126/85  BP Method Automatic  Oxygen Therapy  SpO2 100 %

## 2020-10-05 NOTE — Plan of Care (Signed)
Called to see patient - while in cafeteria he became profusely diaphoretic with sweat pouring off forehead and extremities, c/o nausea and CP, and described feeling weak and dizzy. He did not lose consciousness and had FSBS 272. BP 126/85 HR 94 T 98.4. EMS called and patient transported to [facility] for assessment. Unclear if this is related to blood sugar fluctuations or new BP Med. Patient thinks is related to start of Buspar so medication to be held at this time pending w/u.   Bartholomew Crews, MD, [facility]

## 2020-10-05 NOTE — ED Triage Notes (Signed)
Per EMS, pt from [facility], they report his blood sugar was fluctuating. He reports taking a new med, eating dinner, feeling queasy. CBG was 272. BP 122/80, HR 91. Pt is IVC'd.

## 2020-10-05 NOTE — Progress Notes (Addendum)
[facility]  10/05/2020 3:48 PM Juan Judieth KeensDanirus Adams  MRN:  [id number]  CC: SI  Subjective:  Kire Juan Francesco RunnerHolden is a 35 y.o. adult, with reported past psychiatric history of MDD.  Past medical history of poorly controlled diabetes, HIV, hypertension.  [facility] for SI daily for the last 5 to 10 years. [facility] stay day 10.   24hr events:  The patient's chart was reviewed and nursing notes were reviewed. The patient's case was discussed in multidisciplinary team meeting.  Per MAR: - Patient is compliant with scheduled meds.  - PRNs: Trazadone for sleep and Tylenol for headache Per RN notes, patient's glucose is a bit more stable with glucose running between mid 200s to mid 300s since initiating recommendations from diabetic coordinators.  Also patient's blood pressure is trending down since initiating recommendation from medicine. Patient slept Number of Hours: 6.   Today (10/05/2020): The patient was seen and evaluated on the unit. Patient is seen and examined today.  Patient reported that he feels much better than he did yesterday and that his headache has resolved.  Discussed with patient his fluctuating glucose level, Cr and hypertension.  Encouraged patient to increase p.o. fluid intake and to stay awake during the day.  Patient became irritated, and stated that it is "too controlled here, there is no freedom and that I am used to being more physically active. I do not get enough activity to sweat to be thirsty. I am not an indoor person, I am in inside too much."   Patient reported that he cannot tell if Trileptal has decreased his irritable mood, discussed with patient that we can try switching him to BuSpar.  Patient was amenable to the plan.  Again patient denied consent to talk to his mother. Stated that "mom is taking care of grandma that would bother her, she has her other  story about".  Patient stated that he would rather go to a shelter and use IRC to help manage his medicines.  Patient denied taking anything from the nurses station. Patient denied SI/HI/AVH, delusions, paranoia, first rank symptoms.   Principal Problem: MDD (major depressive disorder), recurrent severe, without psychosis (HCC) Diagnosis: Principal Problem:   MDD (major depressive disorder), recurrent severe, without psychosis (HCC) Active Problems:   Diabetes mellitus type 1 (HCC)   HIV infection (HCC)   Essential hypertension  Total Time spent with patient: 20 minutes  Past Psychiatric History:  Patient has had previous psychiatric admissions.  He is also had psychiatric evaluations when he has been admitted to the hospital for diabetic issues.  It does appear that he has been treated with several antidepressants in the past.  He has been on citalopram for at least 2 to 3 years.  He says it is never really been effective.  Past Medical History:  Past Medical History:  Diagnosis Date   Anxiety    Asthma    no prior hospitalizations, intubations   Depression    Headache    "only w/stress" (11/01/2017)   HIV (human immunodeficiency virus infection) (HCC)    Migraine    "a few/month" (11/01/2017)   Refusal of blood transfusions as patient is Jehovah's Witness    Seizure (HCC)    "only w/my low blood sugars"  (11/01/2017)   Type I diabetes mellitus (HCC)     Past Surgical History:  Procedure Laterality Date   FRACTURE SURGERY     IM  NAILING TIBIA Right 11/01/2017   INTRAMEDULLARY (IM) NAIL TIBIALRightGeneral   ORIF ANKLE FRACTURE Right 11/01/2017   ORIF ANKLE FRACTURE Right 11/01/2017   Procedure: OPEN REDUCTION INTERNAL FIXATION (ORIF) ANKLE FRACTURE;  Surgeon: Roby Lofts, MD;  Location: [facility] OR;  Service: Orthopedics;  Laterality: Right;   TIBIA IM NAIL INSERTION Right 11/01/2017   Procedure: INTRAMEDULLARY (IM) NAIL TIBIAL;  Surgeon: Roby Lofts, MD;  Location: [facility] OR;   Service: Orthopedics;  Laterality: Right;   Family History:  Family History  Problem Relation Age of Onset   Thyroid disease Mother    Diabetes Mother    Breast cancer Maternal Grandmother    Diabetes Maternal Grandmother    Family Psychiatric  History:  Patient was unsure about his father, but did state that he felt as though his mother had been treated for psychiatric illness in the past.  His father was killed by a secondary relative when he was very young.  Social History:  Social History   Substance and Sexual Activity  Alcohol Use Yes   Alcohol/week: 7.0 standard drinks   Types: 7 Cans of beer per week     Social History   Substance and Sexual Activity  Drug Use Not Currently   Frequency: 2.0 times per week   Types: Marijuana   Comment: 11/01/2017  "weekly"    Social History   Socioeconomic History   Marital status: Single    Spouse name: Not on file   Number of children: Not on file   Years of education: Not on file   Highest education level: Not on file  Occupational History   Not on file  Tobacco Use   Smoking status: Every Day    Types: Cigars    Start date: 09/29/2015   Smokeless tobacco: Never   Tobacco comments:    11/01/2017  "2 cigarettes in ~ 6 month "  Vaping Use   Vaping Use: Never used  Substance and Sexual Activity   Alcohol use: Yes    Alcohol/week: 7.0 standard drinks    Types: 7 Cans of beer per week   Drug use: Not Currently    Frequency: 2.0 times per week    Types: Marijuana    Comment: 11/01/2017  "weekly"   Sexual activity: Yes    Partners: Male    Comment: pt. declined condoms  Other Topics Concern   Not on file  Social History Narrative   Not on file   Social Determinants of Health   Financial Resource Strain: Not on file  Food Insecurity: Not on file  Transportation Needs: Not on file  Physical Activity: Not on file  Stress: Not on file  Social Connections: Not on file   Additional Social History:  Specify valuables  returned:  (clothing, toiletries, misc items)  Sleep: fair  Appetite: Poor  Current Medications: Current Facility-Administered Medications  Medication Dose Route Frequency Provider Last Rate Last Admin   acetaminophen (TYLENOL) tablet 650 mg  650 mg Oral Q6H PRN Bobbitt, Shalon E, NP   650 mg at 10/04/20 1527   alum & mag hydroxide-simeth (MAALOX/MYLANTA) 200-200-20 MG/5ML suspension 30 mL  30 mL Oral Q4H PRN Bobbitt, Shalon E, NP       amLODipine (NORVASC) tablet 10 mg  10 mg Oral Daily Antonieta Pert, MD   10 mg at 10/05/20 0937   busPIRone (BUSPAR) tablet 10 mg  10 mg Oral BID Comer Locket, MD       cloNIDine (CATAPRES) tablet  0.1 mg  0.1 mg Oral Q8H PRN Mason Jim, Kameryn Tisdel E, MD   0.1 mg at 10/01/20 1531   elvitegravir-cobicistat-emtricitabine-tenofovir (GENVOYA) 150-150-200-10 MG tablet 1 tablet  1 tablet Oral Q breakfast Bobbitt, Shalon E, NP   1 tablet at 10/05/20 0938   feeding supplement (GLUCERNA SHAKE) (GLUCERNA SHAKE) liquid 237 mL  237 mL Oral TID BM Antonieta Pert, MD   237 mL at 10/03/20 2200   hydrALAZINE (APRESOLINE) tablet 25 mg  25 mg Oral Q8H Kyle, Tyrone A, DO   25 mg at 10/05/20 1412   insulin aspart (novoLOG) injection 0-5 Units  0-5 Units Subcutaneous QHS Bobbitt, Shalon E, NP   5 Units at 10/04/20 2100   insulin aspart (novoLOG) injection 0-9 Units  0-9 Units Subcutaneous TID WC Rashee Marschall E, MD       insulin aspart (novoLOG) injection 3 Units  3 Units Subcutaneous TID WC Dorcas Carrow, MD   3 Units at 10/05/20 1219   insulin glargine (LANTUS) injection 15 Units  15 Units Subcutaneous QHS Antonieta Pert, MD   15 Units at 10/04/20 2100   magnesium hydroxide (MILK OF MAGNESIA) suspension 30 mL  30 mL Oral Daily PRN Bobbitt, Shalon E, NP       ondansetron (ZOFRAN-ODT) disintegrating tablet 8 mg  8 mg Oral Q8H PRN Antonieta Pert, MD       sertraline (ZOLOFT) tablet 50 mg  50 mg Oral Daily Princess Bruins, DO   50 mg at 10/05/20 4098   traZODone (DESYREL)  tablet 50 mg  50 mg Oral QHS PRN Bobbitt, Shalon E, NP   50 mg at 10/04/20 2106    Lab Results:  Results for orders placed or performed during the hospital encounter of 09/25/20 (from the past 48 hour(s))  Glucose, capillary     Status: Abnormal   Collection Time: 10/03/20  5:09 PM  Result Value Ref Range   Glucose-Capillary 463 (H) 70 - 99 mg/dL    Comment: Glucose reference range applies only to samples taken after fasting for at least 8 hours.  Glucose, capillary     Status: Abnormal   Collection Time: 10/03/20  8:46 PM  Result Value Ref Range   Glucose-Capillary 402 (H) 70 - 99 mg/dL    Comment: Glucose reference range applies only to samples taken after fasting for at least 8 hours.  Glucose, capillary     Status: Abnormal   Collection Time: 10/03/20 11:53 PM  Result Value Ref Range   Glucose-Capillary 185 (H) 70 - 99 mg/dL    Comment: Glucose reference range applies only to samples taken after fasting for at least 8 hours.  Glucose, capillary     Status: Abnormal   Collection Time: 10/04/20  6:29 AM  Result Value Ref Range   Glucose-Capillary 58 (L) 70 - 99 mg/dL    Comment: Glucose reference range applies only to samples taken after fasting for at least 8 hours.   Comment 1 Notify RN    Comment 2 Document in Chart   Glucose, capillary     Status: Abnormal   Collection Time: 10/04/20  6:45 AM  Result Value Ref Range   Glucose-Capillary 66 (L) 70 - 99 mg/dL    Comment: Glucose reference range applies only to samples taken after fasting for at least 8 hours.   Comment 1 Notify RN   Glucose, capillary     Status: None   Collection Time: 10/04/20  6:51 AM  Result Value Ref Range  Glucose-Capillary 78 70 - 99 mg/dL    Comment: Glucose reference range applies only to samples taken after fasting for at least 8 hours.  Glucose, capillary     Status: Abnormal   Collection Time: 10/04/20 11:56 AM  Result Value Ref Range   Glucose-Capillary 270 (H) 70 - 99 mg/dL    Comment:  Glucose reference range applies only to samples taken after fasting for at least 8 hours.  Glucose, capillary     Status: Abnormal   Collection Time: 10/04/20  5:09 PM  Result Value Ref Range   Glucose-Capillary 253 (H) 70 - 99 mg/dL    Comment: Glucose reference range applies only to samples taken after fasting for at least 8 hours.  Comprehensive metabolic panel     Status: Abnormal   Collection Time: 10/04/20  6:30 PM  Result Value Ref Range   Sodium 133 (L) 135 - 145 mmol/L   Potassium 4.3 3.5 - 5.1 mmol/L   Chloride 98 98 - 111 mmol/L   CO2 26 22 - 32 mmol/L   Glucose, Bld 364 (H) 70 - 99 mg/dL    Comment: Glucose reference range applies only to samples taken after fasting for at least 8 hours.   BUN 22 (H) 6 - 20 mg/dL   Creatinine, Ser 8.46 (H) 0.61 - 1.24 mg/dL   Calcium 9.8 8.9 - 65.9 mg/dL   Total Protein 7.8 6.5 - 8.1 g/dL   Albumin 4.3 3.5 - 5.0 g/dL   AST 15 15 - 41 U/L   ALT 20 0 - 44 U/L   Alkaline Phosphatase 56 38 - 126 U/L   Total Bilirubin 0.6 0.3 - 1.2 mg/dL   GFR, Estimated >93 >57 mL/min    Comment: (NOTE) Calculated using the CKD-EPI Creatinine Equation (2021)    Anion gap 9 5 - 15    Comment: Performed at [facility], [address] [place], [place] [address]  Glucose, capillary     Status: Abnormal   Collection Time: 10/04/20  8:31 PM  Result Value Ref Range   Glucose-Capillary 494 (H) 70 - 99 mg/dL    Comment: Glucose reference range applies only to samples taken after fasting for at least 8 hours.  Glucose, capillary     Status: Abnormal   Collection Time: 10/04/20 10:26 PM  Result Value Ref Range   Glucose-Capillary 453 (H) 70 - 99 mg/dL    Comment: Glucose reference range applies only to samples taken after fasting for at least 8 hours.  Glucose, capillary     Status: Abnormal   Collection Time: 10/05/20  2:03 AM  Result Value Ref Range   Glucose-Capillary 335 (H) 70 - 99 mg/dL    Comment: Glucose reference range applies  only to samples taken after fasting for at least 8 hours.  Glucose, capillary     Status: Abnormal   Collection Time: 10/05/20  6:20 AM  Result Value Ref Range   Glucose-Capillary 233 (H) 70 - 99 mg/dL    Comment: Glucose reference range applies only to samples taken after fasting for at least 8 hours.  Glucose, capillary     Status: Abnormal   Collection Time: 10/05/20 12:02 PM  Result Value Ref Range   Glucose-Capillary 360 (H) 70 - 99 mg/dL    Comment: Glucose reference range applies only to samples taken after fasting for at least 8 hours.   Comment 1 Notify RN     Blood Alcohol level:  Lab Results  Component  Value Date   [facility] <10 09/24/2020   ETH <10 02/09/2020    Metabolic Disorder Labs: Lab Results  Component Value Date   HGBA1C 9.7 (H) 09/09/2020   MPG 231.69 09/09/2020   MPG 205.86 02/11/2020   Lab Results  Component Value Date   PROLACTIN 6.0 09/24/2020   Lab Results  Component Value Date   CHOL 187 09/24/2020   TRIG 50 09/24/2020   HDL 100 09/24/2020   CHOLHDL 1.9 09/24/2020   VLDL 10 09/24/2020   LDLCALC 77 09/24/2020   LDLCALC 64 11/22/2019    Physical Findings:  Musculoskeletal: Strength & Muscle Tone: within normal limits Gait & Station: normal Patient leans: N/A   Psychiatric Specialty Exam: Physical Exam Vitals and nursing note reviewed.  HENT:     Head: Normocephalic and atraumatic.  Pulmonary:     Effort: Pulmonary effort is normal.  Neurological:     General: No focal deficit present.     Mental Status: He is alert and oriented to person, place, and time.    Review of Systems  Respiratory:  Negative for shortness of breath.   Cardiovascular:  Negative for chest pain.  Gastrointestinal:  Negative for abdominal pain, constipation, nausea and vomiting.  Neurological:  Negative for dizziness and headaches.  Psychiatric/Behavioral:  Positive for dysphoric mood and sleep disturbance. Negative for hallucinations. The patient is  nervous/anxious.    Blood pressure (!) 138/93, pulse (!) 104, temperature 98.3 F (36.8 C), temperature source Oral, resp. rate 16, height 6\' 1"  (1.854 m), weight 93 kg, SpO2 99 %.Body mass index is 27.05 kg/m.  General Appearance: Casual and appropriate hygeine, not wearing vision glasses today  Eye Contact:  Poor, looking mostly away or towards the ground  Speech: Clear and coherent, irritated tone, loud sighs and groans, normal rate, spontaneous  Volume: Normal  Mood:  dysphoric, irritable  Affect:  Congruent  Thought Process:  Coherent, Goal Directed, and Linear   Orientation:  Full (Time, Place, and Person)  Thought Content: Patient denied SI/HI/AVH, delusions, paranoia, first rank symptoms. Patient is not grossly responding to internal/external stimuli on exam and did not make delusional statements.  Suicidal Thoughts:  No  Homicidal Thoughts:  No  Memory:  Immediate;   Good Recent;   Fair Remote;   Fair  Judgement:  Fair  Insight:  Fair  Psychomotor Activity:  Normal   Concentration:  Concentration: Fair and Attention Span: Fair  Recall:  of Knowledge:  Good  Language:  Good  Akathisia:  NA  Assets:  Communication Skills Desire for Improvement Resilience  ADL's:  Intact  Cognition:  WNL  Sleep: 6    Treatment Plan Summary: Daily contact with patient to assess and evaluate symptoms and progress in treatment and Medication management  [name] is a 35 y.o. adult, with reported past psychiatric history of MDD.  Past medical history of poorly controlled diabetes, HIV, hypertension.  Patient presents involuntarily to [facility] (09/25/2020), transferred from [facility] (09/24/2020) for SI daily for the last 5 to 10 years. [facility] stay day 10.     ASSESSMENT: Diagnosis:   - MDD (major depressive disorder), recurrent severe, without psychosis  - Diabetes mellitus type 1   - HIV infection  - Essential hypertension  PSYCHIATRIC DIAGNOSIS  & TREATMENT:  MDD - Continue Zoloft 50 mg p.o. daily - Start BuSpar 10 mg p.o. twice daily for irritability - Discontinue Trileptal due to c/o sedation and perceived lack of effectiveness of  medication - Continue Glucerna shake liquid 237 mL p.o. 3 times daily between meals for decreased appetite due to depression -- Placed on room lock out for meals and groups   MEDICAL MANAGEMENT: Hypertension - Continue amlodipine 10 mg p.o. daily - Continue hydralazine 25 mg p.o. 3 times daily per recommendation of medicine  CMP: Na+ 133, Cr 1.44 (8/7) Repeat BMP tomorrow in the morning - Medicine consult following  Diabetes - Continue sliding scale  Lantus 15 units IM nightly  Novolog 3 units IM with meals per diabetic coordinator's /hospitalist recommendations- improved po intake encouraged Continue monitoring CBGs 4 times daily  HIV - Continue Genvoya 1 tablet daily with breakfast  Elevated Creatinine  - Cr 1.44 10/04/20 - pushing po fluids and rechecking in morning  Hyponatremia (Na+ 133) - Most recent CMP showed Na+ 133 10/04/2020 - Discontinued Trileptal - Will monitor BMP - could be related to fluctuating blood sugar control  Hyperkalemia (K+ 5.3)  RESOLVED - K+ 4.3 on 10/04/20  PRN's -Trazodone 50mg  PO qHS PRN for insomnia -Alum & mag hydroxide-simeth 25ml PO qHS PRN for GERD -Magnesium hydroxide 24ml PO daily PRN for constipation -Acetaminophen tablet 650mg  PO PRN q6hrs for mild pain  -Clonidine 0.1 mg p.o. 3 times daily as needed for SBP >150 -Zofran 8 mg p.o. every 8 hours as needed for nausea and vomiting  Discharge Planning: -Social work and case management to assist with discharge planning and identification of hospital follow-up needs prior to discharge - patient now wants to stay with mother at discharge and is considering 31m -Estimated LOS: 2-3 days -Discharge Concerns: Need to establish a safety plan; Medication compliance and effectiveness -Discharge Goals:  Return home with outpatient referrals for mental health follow-up including medication management/psychotherapy  Safety and Monitoring: -Involuntary admission to inpatient psychiatric unit for safety, stabilization and treatment -Daily contact with patient to assess and evaluate symptoms and progress in treatment -Patient's case to be discussed in multi-disciplinary team meeting -Observation Level : q15 minute checks -Vital signs: q12 hours -Precautions: suicide, elopement, and assault   , DO, PGY-1 10/05/2020, 3:48 PM

## 2020-10-05 NOTE — Progress Notes (Signed)
Was called to see patient. Reported that while in cafeteria, patient suddenly became diaphoretic soaking his shirt completely. Patient reported feeling lightheaded, nausea, blurry vision, chest pain, and feeling weak.  Patient did not lose consciousness or fall.   Patient is a 35 year old male, past medical history of uncontrolled type 1 diabetes, hypertension, and HIV.  Currently at [facility], patient is on 15 units of Lantus at bedtime, 3 units of NovoLog with meals, amlodipine 10 mg.  Medicine was consulted over the weekend for elevating blood pressure and labile glucose.  Further recommendation patient was started on hydralazine 25 mg 3 times a day yesterday.  Today started patient on BuSpar 10 mg, which patient received at 1728.  We are holding BuSpar. Patient's last BP 126/85 and blood glucose of 272.  This author called and spoke to Dr. Rosalia Hammers, the ED attending on Psych at [facility] ED and the charge nurse.  Patient is to return here to [facility] after stabilization.   Princess Bruins, DO, PGY-1

## 2020-10-05 NOTE — Progress Notes (Signed)
Pt's blood sugar was elevated above 400  ( at 494) and provider was notified. Recommendation was to continue to administer scheduled and sliding scale coverage then retake blood sugar in an hour. After one hour blood sugar was 453. Provider ordered 0300 blood sugar check. At 0300 blood sugar was 335. Will retake approximately 0630. Pt remained asymptomatic and no observable signs of hyperglycemia by this Clinical research associate. Pt encouraged to notify staff if not feeling well.Will continue to monitor and assess. Safety maintained

## 2020-10-05 NOTE — Progress Notes (Signed)
Patient's chart reviewed.  No face-to-face encounter done.  See for details consultation note from Dr. Margie Ege 10/04/20.   Essential hypertension: Blood pressure acceptably controlled on current regimen of amlodipine and hydralazine.  Please continue similar doses.  There is still room to go up on hydralazine, however in ambulatory setting his blood pressure should do okay on these medications.  Type 2 diabetes with hyperglycemia: A1c 9.1. Blood sugars [phone].  We do have more room to work on his diabetes regimen. Continue Lantus 15 units in the evening. Add NovoLog 3 units with meals. Continue sliding scale insulin.  If blood sugars persistently more than 200, will increase long-acting insulin and prandial insulin tomorrow.  No charge visit.  We will continue to help adjusting medications.  Please call with questions.  I will level on secure chat [phone].   Thank you  Dorcas Carrow MD

## 2020-10-05 NOTE — B[facility] Counselor (Signed)
CSW spoke with the Pt this morning about discharge and contacting his mother.  The Pt did not want the doctor or the CSW to contact his mother stating that "she is busy and I no longer think that she will wont me to live there since she takes care of my grandmother too".  The Pt states that he would like to go to a shelter at discharge and will get help with his medications at the [facility].  The doctor asked the Pt to drink more water and Gatorade to help with increasing his Sodium and managing his Blood Pressure and Diabetes.  The Pt states that he has been attempting to drink more fluids and does not feel that he can take in anymore fluids at this time.  The Pt also reports that he wants to go outside to get "fresh air" and that being in the hospital is what is making his Blood Pressure rise.  The Pt agreed to attempt to drink more water and will work to better manage his Diabetes and Blood Pressure while in the hospital and after discharge from the hospital.

## 2020-10-05 NOTE — Progress Notes (Signed)
@  1818 Pt. Left by ems. To [facility].

## 2020-10-06 ENCOUNTER — Ambulatory Visit: Payer: Self-pay

## 2020-10-06 LAB — BASIC METABOLIC PANEL
Anion gap: 6 (ref 5–15)
BUN: 27 mg/dL — ABNORMAL HIGH (ref 6–20)
CO2: 28 mmol/L (ref 22–32)
Calcium: 9.8 mg/dL (ref 8.9–10.3)
Chloride: 99 mmol/L (ref 98–111)
Creatinine, Ser: 1.34 mg/dL — ABNORMAL HIGH (ref 0.61–1.24)
GFR, Estimated: >60 mL/min (ref >60)
Glucose, Bld: 303 mg/dL — ABNORMAL HIGH (ref 70–99)
Potassium: 4.2 mmol/L (ref 3.5–5.1)
Sodium: 133 mmol/L — ABNORMAL LOW (ref 135–145)

## 2020-10-06 LAB — RESP PANEL BY RT-PCR (FLU A&B, COVID) ARPGX2
Influenza A by PCR: NEGATIVE
Influenza B by PCR: NEGATIVE
SARS Coronavirus 2 by RT PCR: NEGATIVE

## 2020-10-06 LAB — GLUCOSE, CAPILLARY
Glucose-Capillary: 256 mg/dL — ABNORMAL HIGH (ref 70–99)
Glucose-Capillary: 275 mg/dL — ABNORMAL HIGH (ref 70–99)
Glucose-Capillary: 309 mg/dL — ABNORMAL HIGH (ref 70–99)
Glucose-Capillary: 213 mg/dL — ABNORMAL HIGH (ref 70–99)
Glucose-Capillary: 80 mg/dL (ref 70–99)

## 2020-10-06 LAB — CBG MONITORING, ED
Glucose-Capillary: 187 mg/dL — ABNORMAL HIGH (ref 70–99)
Glucose-Capillary: 243 mg/dL — ABNORMAL HIGH (ref 70–99)

## 2020-10-06 MED ORDER — INSULIN GLARGINE 100 UNIT/ML ~~LOC~~ SOLN
12.0000 [IU] | Freq: Every day | SUBCUTANEOUS | Status: DC
  Administered 2020-10-06 – 2020-10-07 (×2): 12 [IU] via SUBCUTANEOUS

## 2020-10-06 NOTE — ED Notes (Signed)
Gave report to nurse at [facility].

## 2020-10-06 NOTE — ED Provider Notes (Signed)
Provider Note MRN:  [id number]  Arrival date & time: 10/06/20    ED Course and Medical Decision Making  Assumed care from Dr. Effie Shy at shift change.  Hypoglycemia, plan is for repeat glucose and if reassuring plan is for discharge.  Procedures  Final Clinical Impressions(s) / ED Diagnoses     ICD-10-CM   1. Medication intolerance  Z78.9     2. Hypoglycemia  E16.2       ED Discharge Orders     None         Discharge Instructions      Carbohydrate Counting For People With Diabetes  Foods with carbohydrates make your blood glucose level go up. Learning how to count carbohydrates can help you control your blood glucose levels. First, identify the foods you eat that contain carbohydrates. Then, using the Foods with Carbohydrates chart, determine about how much carbohydrates are in your meals and snacks. Make sure you are eating foods with fiber, protein, and healthy fat along with your carbohydrate foods. Foods with Carbohydrates The following table shows carbohydrate foods that have about 15 grams of carbohydrate each. Using measuring cups, spoons, or a food scale when you first begin learning about carbohydrate counting can help you learn about the portion sizes you typically eat. The following foods have 15 grams carbohydrate each:  Grains 1 slice bread (1 ounce)  1 small tortilla (6-inch size)   large bagel (1 ounce)  1/3 cup pasta or rice (cooked)   hamburger or hot dog bun ( ounce)   cup cooked cereal   to  cup ready-to-eat cereal  2 taco shells (5-inch size) Fruit 1 small fresh fruit ( to 1 cup)   medium banana  17 small grapes (3 ounces)  1 cup melon or berries   cup canned or frozen fruit  2 tablespoons dried fruit (blueberries, cherries, cranberries, raisins)   cup unsweetened fruit juice  Starchy Vegetables  cup cooked beans, peas, corn, potatoes/sweet potatoes   large baked potato (3 ounces)  1 cup acorn or butternut squash  Snack Foods 3 to  6 crackers  8 potato chips or 13 tortilla chips ( ounce to 1 ounce)  3 cups popped popcorn  Dairy 3/4 cup (6 ounces) nonfat plain yogurt, or yogurt with sugar-free sweetener  1 cup milk  1 cup plain rice, soy, coconut or flavored almond milk Sweets and Desserts  cup ice cream or frozen yogurt  1 tablespoon jam, jelly, pancake syrup, table sugar, or honey  2 tablespoons light pancake syrup  1 inch square of frosted cake or 2 inch square of unfrosted cake  2 small cookies (2/3 ounce each) or  large cookie  Sometimes you'll have to estimate carbohydrate amounts if you don't know the exact recipe. One cup of mixed foods like soups can have 1 to 2 carbohydrate servings, while some casseroles might have 2 or more servings of carbohydrate. Foods that have less than 20 calories in each serving can be counted as "free" foods. Count 1 cup raw vegetables, or  cup cooked non-starchy vegetables as "free" foods. If you eat 3 or more servings at one meal, then count them as 1 carbohydrate serving.  Foods without Carbohydrates  Not all foods contain carbohydrates. Meat, some dairy, fats, non-starchy vegetables, and many beverages don't contain carbohydrate. So when you count carbohydrates, you can generally exclude chicken, pork, beef, fish, seafood, eggs, tofu, cheese, butter, sour cream, avocado, nuts, seeds, olives, mayonnaise, water, black coffee, unsweetened tea, and zero-calorie  drinks. Vegetables with no or low carbohydrate include green beans, cauliflower, tomatoes, and onions. How much carbohydrate should I eat at each meal?  Carbohydrate counting can help you plan your meals and manage your weight. Following are some starting points for carbohydrate intake at each meal. Work with your registered dietitian nutritionist to find the best range that works for your blood glucose and weight.   To Lose Weight To Maintain Weight  Women 2 - 3 carb servings 3 - 4 carb servings  Men 3 - 4 carb servings 4 - 5  carb servings  Checking your blood glucose after meals will help you know if you need to adjust the timing, type, or number of carbohydrate servings in your meal plan. Achieve and keep a healthy body weight by balancing your food intake and physical activity.  Tips How should I plan my meals?  Plan for half the food on your plate to include non-starchy vegetables, like salad greens, broccoli, or carrots. Try to eat 3 to 5 servings of non-starchy vegetables every day. Have a protein food at each meal. Protein foods include chicken, fish, meat, eggs, or beans (note that beans contain carbohydrate). These two food groups (non-starchy vegetables and proteins) are low in carbohydrate. If you fill up your plate with these foods, you will eat less carbohydrate but still fill up your stomach. Try to limit your carbohydrate portion to  of the plate.  What fats are healthiest to eat?  Diabetes increases risk for heart disease. To help protect your heart, eat more healthy fats, such as olive oil, nuts, and avocado. Eat less saturated fats like butter, cream, and high-fat meats, like bacon and sausage. Avoid trans fats, which are in all foods that list "partially hydrogenated oil" as an ingredient. What should I drink?  Choose drinks that are not sweetened with sugar. The healthiest choices are water, carbonated or seltzer waters, and tea and coffee without added sugars.  Sweet drinks will make your blood glucose go up very quickly. One serving of soda or energy drink is  cup. It is best to drink these beverages only if your blood glucose is low.  Artificially sweetened, or diet drinks, typically do not increase your blood glucose if they have zero calories in them. Read labels of beverages, as some diet drinks do have carbohydrate and will raise your blood glucose. Label Reading Tips Read Nutrition Facts labels to find out how many grams of carbohydrate are in a food you want to eat. Don't forget: sometimes  serving sizes on the label aren't the same as how much food you are going to eat, so you may need to calculate how much carbohydrate is in the food you are serving yourself.   Carbohydrate Counting for People with Diabetes Sample 1-Day Menu  Breakfast  cup yogurt, low fat, low sugar (1 carbohydrate serving)   cup cereal, ready-to-eat, unsweetened (1 carbohydrate serving)  1 cup strawberries (1 carbohydrate serving)   cup almonds ( carbohydrate serving)  Lunch 1, 5 ounce can chunk light tuna  2 ounces cheese, low fat cheddar  6 whole wheat crackers (1 carbohydrate serving)  1 small apple (1 carbohydrate servings)   cup carrots ( carbohydrate serving)   cup snap peas  1 cup 1% milk (1 carbohydrate serving)   Evening Meal Stir fry made with: 3 ounces chicken  1 cup brown rice (3 carbohydrate servings)   cup broccoli ( carbohydrate serving)   cup green beans   cup onions  1 tablespoon olive oil  2 tablespoons teriyaki sauce ( carbohydrate serving)  Evening Snack 1 extra small banana (1 carbohydrate serving)  1 tablespoon peanut butter   Carbohydrate Counting for People with Diabetes Vegan Sample 1-Day Menu  Breakfast 1 cup cooked oatmeal (2 carbohydrate servings)   cup blueberries (1 carbohydrate serving)  2 tablespoons flaxseeds  1 cup soymilk fortified with calcium and vitamin D  1 cup coffee  Lunch 2 slices whole wheat bread (2 carbohydrate servings)   cup baked tofu   cup lettuce  2 slices tomato  2 slices avocado   cup baby carrots ( carbohydrate serving)  1 orange (1 carbohydrate serving)  1 cup soymilk fortified with calcium and vitamin D   Evening Meal Burrito made with: 1 6-inch corn tortilla (1 carbohydrate serving)  1 cup refried vegetarian beans (2 carbohydrate servings)   cup chopped tomatoes   cup lettuce   cup salsa  1/3 cup brown rice (1 carbohydrate serving)  1 tablespoon olive oil for rice   cup zucchini   Evening Snack 6 small whole  grain crackers (1 carbohydrate serving)  2 apricots ( carbohydrate serving)   cup unsalted peanuts ( carbohydrate serving)    Carbohydrate Counting for People with Diabetes Vegetarian (Lacto-Ovo) Sample 1-Day Menu  Breakfast 1 cup cooked oatmeal (2 carbohydrate servings)   cup blueberries (1 carbohydrate serving)  2 tablespoons flaxseeds  1 egg  1 cup 1% milk (1 carbohydrate serving)  1 cup coffee  Lunch 2 slices whole wheat bread (2 carbohydrate servings)  2 ounces low-fat cheese   cup lettuce  2 slices tomato  2 slices avocado   cup baby carrots ( carbohydrate serving)  1 orange (1 carbohydrate serving)  1 cup unsweetened tea  Evening Meal Burrito made with: 1 6-inch corn tortilla (1 carbohydrate serving)   cup refried vegetarian beans (1 carbohydrate serving)   cup tomatoes   cup lettuce   cup salsa  1/3 cup brown rice (1 carbohydrate serving)  1 tablespoon olive oil for rice   cup zucchini  1 cup 1% milk (1 carbohydrate serving)  Evening Snack 6 small whole grain crackers (1 carbohydrate serving)  2 apricots ( carbohydrate serving)   cup unsalted peanuts ( carbohydrate serving)    Copyright 2020  Academy of [employer] Dietetics. All rights reserved.  Using Nutrition Labels: Carbohydrate  Serving Size  Look at the serving size. All the information on the label is based on this portion. Servings Per Container  The number of servings contained in the package. Guidelines for Carbohydrate  Look at the total grams of carbohydrate in the serving size.  1 carbohydrate choice = 15 grams of carbohydrate. Range of Carbohydrate Grams Per Choice  Carbohydrate Grams/Choice Carbohydrate Choices  6-10   11-20 1  21-25 1  26-35 2  36-40 2  41-50 3  51-55 3  56-65 4  66-70 4  71-80 5    Copyright 2020  Academy of [employer] and Dietetics. All rights reserved.     Elmer Sow. Pilar Plate, MD [facility]  Health [email or web address]    Sabas Sous, MD 10/06/20 [phone]

## 2020-10-06 NOTE — Progress Notes (Signed)
Patient returned from [facility] ED via Safe transport Alert and oriented x 4 with steady gait denies SI/HI/A/VH. No new orders given from the ED.V/S WNL. and Blood Glucose monitored.  Skin assessment done per Protocol. Bed time snack and fluids offered and tolerated well   Results for [name], [name] (MRN [id number]) as of 10/06/2020 04:20  Ref. Range 10/05/2020 21:50 10/05/2020 22:52 10/06/2020 00:57 10/06/2020 03:24 10/06/2020 04:15  Glucose-Capillary Latest Ref Range: 70 - 99 mg/dL   193 (H) 790 (H) 240 (H)

## 2020-10-06 NOTE — Progress Notes (Signed)
Pt visible on the unit , pt had no issues this evening. Pt given PRN Trazodone PRN per MAR with HS medication    10/06/20 2200  Psych Admission Type (Psych Patients Only)  Admission Status Involuntary  Psychosocial Assessment  Patient Complaints Anxiety  Eye Contact Fair  Facial Expression Sad  Affect Sad  Speech Slow  Interaction Assertive  Motor Activity Slow  Appearance/Hygiene Unremarkable  Behavior Characteristics Cooperative  Mood Pleasant  Thought Process  Coherency WDL  Content WDL  Delusions None reported or observed  Perception WDL  Hallucination None reported or observed  Judgment Poor  Confusion WDL  Danger to Self  Current suicidal ideation? Denies  Self-Injurious Behavior No self-injurious ideation or behavior indicators observed or expressed   Agreement Not to Harm Self Yes  Description of Agreement verbal  Danger to Others  Danger to Others None reported or observed

## 2020-10-06 NOTE — Progress Notes (Signed)
Pt has been alert and oriented to person, place, time and situation. Denies SI/HI/AVH. PT's mood has been labile, laughing and joking with staff and peers at one time, at other times, when lab came to take pt's blood pt was resistant to care, angry, irritable, and refused to come up for his labs siting, "Not now, I don't feel like it." Will continue to monitor pt per Q15 minute face checks and monitor for safety and progress.

## 2020-10-06 NOTE — ED Notes (Signed)
Pt given apple juice and crackers for low blood sugar.

## 2020-10-06 NOTE — [facility] Group Notes (Signed)
Spiritual care group on grief and loss facilitated by chaplain Dyanne Carrel, [facility]   Group Goal:   Support / Education around grief and loss   Members engage in facilitated group support and psycho-social education.   Group Description:   Following introductions and group rules, group members engaged in facilitated group dialog and support around topic of loss, with particular support around experiences of loss in their lives. Group Identified types of loss (relationships / self / things) and identified patterns, circumstances, and changes that precipitate losses. Reflected on thoughts / feelings around loss, normalized grief responses, and recognized variety in grief experience. Group noted Worden's four tasks of grief in discussion.   Group drew on Adlerian / Rogerian, narrative, MI,   Patient Progress: [name] came into group towards the end.  His verbal participation was limited, but he showed attentive engagement with the conversation.  Chaplain Dyanne Carrel, B[facility] Pager, [phone] 11:53 AM

## 2020-10-06 NOTE — Progress Notes (Signed)
Chart reviewed.  Patient had a trip to emergency room with possibly symptomatic hypoglycemia, blood sugars 66? And just taken buspar 10 mg.   Widely fluctuating blood sugars.  In order to avoid hypoglycemia, will accept elevated blood sugars.  Decrease Levemir to 12 units in the evening.  Keep prandial insulin 3 units 3 times daily.  Symptomatology not consistent with hydralazine induced tachycardia.  Can continue hydralazine for blood pressure control.  No charge visit.

## 2020-10-06 NOTE — ED Notes (Signed)
Pt transported via safe transport back to [facility].

## 2020-10-06 NOTE — Progress Notes (Signed)
The patient described his day as having had multiple ups and downs. He credited his peers with keeping him in good spirits. His goal for tomorrow is to work on his discharge plans.

## 2020-10-06 NOTE — Progress Notes (Signed)
[facility] MD Progress Note  10/06/2020 1:53 PM [name]  MRN:  [id number]  CC: SI  Subjective:  [name] is a 35 y.o. adult, with reported past psychiatric history of MDD.  Past medical history of poorly controlled diabetes, HIV, hypertension.  Patient presents involuntarily to [facility] (09/25/2020), transferred from [facility] (09/24/2020) for SI daily for the last 5 to 10 years. [facility] stay day 11.   24hr events:  The patient's chart was reviewed and nursing notes were reviewed. The patient's case was discussed in multidisciplinary team meeting.  Per MAR: - Patient is compliant with scheduled meds.  - PRNs: Zofran Patient had sudden symptoms of profuse diaphoresis, chest pain, nausea, headache, dizziness, during dinner.  The only new medication started was BuSpar and an hour prior to the event.  Patient was sent to [facility] ED where he was monitored and was returned to [facility] after stabilization.  Patient slept Number of Hours: 6.   Today (10/06/2020): The patient was seen and evaluated on the unit. Patient is seen and examined today.  Patient reported that he was returned to [facility] at 4 AM last night.  Patient denied symptoms of chest pain, shortness of breath, lightheadedness, dizziness, nausea, abdominal pain.  Patient described his frustration in regards to what happened last night.  Stated that he felt blind sided by what happened last night.  Discussed with patient that this was a very unusual reaction to BuSpar, discussed that perhaps it may have been an interaction with his existing medicines.  Discussed with patient that his symptoms did not align with an allergic reaction as his vital signs during that time were stable.  Patient stated that his sleep was poor due to the events of last night, his mood is "alright", and his appetite is "fair".  Encouraged patient to continue his increased p.o. fluid intake and to stay awake during the day. Patient  denied SI/HI/AVH, delusions, paranoia, first rank symptoms.   Principal Problem: MDD (major depressive disorder), recurrent severe, without psychosis (HCC) Diagnosis: Principal Problem:   MDD (major depressive disorder), recurrent severe, without psychosis (HCC) Active Problems:   Diabetes mellitus type 1 (HCC)   HIV infection (HCC)   Essential hypertension  Total Time spent with patient: 20 minutes  Past Psychiatric History:  Patient has had previous psychiatric admissions.  He is also had psychiatric evaluations when he has been admitted to the hospital for diabetic issues.  It does appear that he has been treated with several antidepressants in the past.  He has been on citalopram for at least 2 to 3 years.  He says it is never really been effective.  Past Medical History:  Past Medical History:  Diagnosis Date   Anxiety    Asthma    no prior hospitalizations, intubations   Depression    Headache    "only w/stress" (11/01/2017)   HIV (human immunodeficiency virus infection) (HCC)    Migraine    "a few/month" (11/01/2017)   Refusal of blood transfusions as patient is Jehovah's Witness    Seizure (HCC)    "only w/my low blood sugars"  (11/01/2017)   Type I diabetes mellitus (HCC)     Past Surgical History:  Procedure Laterality Date   FRACTURE SURGERY     IM NAILING TIBIA Right 11/01/2017   INTRAMEDULLARY (IM) NAIL TIBIALRightGeneral   ORIF ANKLE FRACTURE Right 11/01/2017   ORIF ANKLE FRACTURE Right 11/01/2017   Procedure: OPEN REDUCTION INTERNAL FIXATION (ORIF)  ANKLE FRACTURE;  Surgeon: Roby Lofts, MD;  Location: [facility] OR;  Service: Orthopedics;  Laterality: Right;   TIBIA IM NAIL INSERTION Right 11/01/2017   Procedure: INTRAMEDULLARY (IM) NAIL TIBIAL;  Surgeon: Roby Lofts, MD;  Location: [facility] OR;  Service: Orthopedics;  Laterality: Right;   Family History:  Family History  Problem Relation Age of Onset   Thyroid disease Mother    Diabetes Mother    Breast cancer Maternal  Grandmother    Diabetes Maternal Grandmother    Family Psychiatric  History:  Patient was unsure about his father, but did state that he felt as though his mother had been treated for psychiatric illness in the past.  His father was killed by a secondary relative when he was very young.  Social History:  Social History   Substance and Sexual Activity  Alcohol Use Yes   Alcohol/week: 7.0 standard drinks   Types: 7 Cans of beer per week     Social History   Substance and Sexual Activity  Drug Use Not Currently   Frequency: 2.0 times per week   Types: Marijuana   Comment: 11/01/2017  "weekly"    Social History   Socioeconomic History   Marital status: Single    Spouse name: Not on file   Number of children: Not on file   Years of education: Not on file   Highest education level: Not on file  Occupational History   Not on file  Tobacco Use   Smoking status: Every Day    Types: Cigars    Start date: 09/29/2015   Smokeless tobacco: Never   Tobacco comments:    11/01/2017  "2 cigarettes in ~ 6 month "  Vaping Use   Vaping Use: Never used  Substance and Sexual Activity   Alcohol use: Yes    Alcohol/week: 7.0 standard drinks    Types: 7 Cans of beer per week   Drug use: Not Currently    Frequency: 2.0 times per week    Types: Marijuana    Comment: 11/01/2017  "weekly"   Sexual activity: Yes    Partners: Male    Comment: pt. declined condoms  Other Topics Concern   Not on file  Social History Narrative   Not on file   Social Determinants of Health   Financial Resource Strain: Not on file  Food Insecurity: Not on file  Transportation Needs: Not on file  Physical Activity: Not on file  Stress: Not on file  Social Connections: Not on file   Additional Social History:  Specify valuables returned:  (clothing, toiletries, misc items)  Sleep: Poor  Appetite: Fair  Current Medications: Current Facility-Administered Medications  Medication Dose Route Frequency Provider  Last Rate Last Admin   acetaminophen (TYLENOL) tablet 650 mg  650 mg Oral Q6H PRN Bobbitt, Shalon E, NP   650 mg at 10/04/20 1527   alum & mag hydroxide-simeth (MAALOX/MYLANTA) 200-200-20 MG/5ML suspension 30 mL  30 mL Oral Q4H PRN Bobbitt, Shalon E, NP       amLODipine (NORVASC) tablet 10 mg  10 mg Oral Daily Antonieta Pert, MD   10 mg at 10/06/20 0800   cloNIDine (CATAPRES) tablet 0.1 mg  0.1 mg Oral Q8H PRN Bartholomew Crews E, MD   0.1 mg at 10/01/20 1531   elvitegravir-cobicistat-emtricitabine-tenofovir (GENVOYA) 150-150-200-10 MG tablet 1 tablet  1 tablet Oral Q breakfast Bobbitt, Shalon E, NP   1 tablet at 10/06/20 0759   feeding supplement (GLUCERNA SHAKE) (GLUCERNA  SHAKE) liquid 237 mL  237 mL Oral TID BM Antonieta Pert, MD   237 mL at 10/03/20 2200   hydrALAZINE (APRESOLINE) tablet 25 mg  25 mg Oral Q8H Kyle, Tyrone A, DO   25 mg at 10/06/20 1313   insulin aspart (novoLOG) injection 0-5 Units  0-5 Units Subcutaneous QHS Bobbitt, Shalon E, NP   5 Units at 10/04/20 2100   insulin aspart (novoLOG) injection 0-9 Units  0-9 Units Subcutaneous TID WC Mason Jim, Amy E, MD   7 Units at 10/06/20 1315   insulin aspart (novoLOG) injection 3 Units  3 Units Subcutaneous TID WC Dorcas Carrow, MD   3 Units at 10/06/20 1314   insulin glargine (LANTUS) injection 12 Units  12 Units Subcutaneous QHS Dorcas Carrow, MD       magnesium hydroxide (MILK OF MAGNESIA) suspension 30 mL  30 mL Oral Daily PRN Bobbitt, Shalon E, NP       ondansetron (ZOFRAN-ODT) disintegrating tablet 8 mg  8 mg Oral Q8H PRN Antonieta Pert, MD   8 mg at 10/05/20 1803   sertraline (ZOLOFT) tablet 50 mg  50 mg Oral Daily Princess Bruins, DO   50 mg at 10/06/20 0759   traZODone (DESYREL) tablet 50 mg  50 mg Oral QHS PRN Bobbitt, Shalon E, NP   50 mg at 10/04/20 2106    Lab Results:  Results for orders placed or performed during the hospital encounter of 09/25/20 (from the past 48 hour(s))  Glucose, capillary     Status:  Abnormal   Collection Time: 10/04/20  5:09 PM  Result Value Ref Range   Glucose-Capillary 253 (H) 70 - 99 mg/dL    Comment: Glucose reference range applies only to samples taken after fasting for at least 8 hours.  Comprehensive metabolic panel     Status: Abnormal   Collection Time: 10/04/20  6:30 PM  Result Value Ref Range   Sodium 133 (L) 135 - 145 mmol/L   Potassium 4.3 3.5 - 5.1 mmol/L   Chloride 98 98 - 111 mmol/L   CO2 26 22 - 32 mmol/L   Glucose, Bld 364 (H) 70 - 99 mg/dL    Comment: Glucose reference range applies only to samples taken after fasting for at least 8 hours.   BUN 22 (H) 6 - 20 mg/dL   Creatinine, Ser 1.61 (H) 0.61 - 1.24 mg/dL   Calcium 9.8 8.9 - 09.6 mg/dL   Total Protein 7.8 6.5 - 8.1 g/dL   Albumin 4.3 3.5 - 5.0 g/dL   AST 15 15 - 41 U/L   ALT 20 0 - 44 U/L   Alkaline Phosphatase 56 38 - 126 U/L   Total Bilirubin 0.6 0.3 - 1.2 mg/dL   GFR, Estimated >04 >54 mL/min    Comment: (NOTE) Calculated using the CKD-EPI Creatinine Equation (2021)    Anion gap 9 5 - 15    Comment: Performed at [facility], [address], [place], [place] [address]  Glucose, capillary     Status: Abnormal   Collection Time: 10/04/20  8:31 PM  Result Value Ref Range   Glucose-Capillary 494 (H) 70 - 99 mg/dL    Comment: Glucose reference range applies only to samples taken after fasting for at least 8 hours.  Glucose, capillary     Status: Abnormal   Collection Time: 10/04/20 10:26 PM  Result Value Ref Range   Glucose-Capillary 453 (H) 70 - 99 mg/dL    Comment: Glucose reference  range applies only to samples taken after fasting for at least 8 hours.  Glucose, capillary     Status: Abnormal   Collection Time: 10/05/20  2:03 AM  Result Value Ref Range   Glucose-Capillary 335 (H) 70 - 99 mg/dL    Comment: Glucose reference range applies only to samples taken after fasting for at least 8 hours.  Glucose, capillary     Status: Abnormal   Collection Time:  10/05/20  6:20 AM  Result Value Ref Range   Glucose-Capillary 233 (H) 70 - 99 mg/dL    Comment: Glucose reference range applies only to samples taken after fasting for at least 8 hours.  Glucose, capillary     Status: Abnormal   Collection Time: 10/05/20 12:02 PM  Result Value Ref Range   Glucose-Capillary 360 (H) 70 - 99 mg/dL    Comment: Glucose reference range applies only to samples taken after fasting for at least 8 hours.   Comment 1 Notify RN   Glucose, capillary     Status: Abnormal   Collection Time: 10/05/20  5:09 PM  Result Value Ref Range   Glucose-Capillary 397 (H) 70 - 99 mg/dL    Comment: Glucose reference range applies only to samples taken after fasting for at least 8 hours.   Comment 1 Notify RN   Glucose, capillary     Status: Abnormal   Collection Time: 10/05/20  5:58 PM  Result Value Ref Range   Glucose-Capillary 272 (H) 70 - 99 mg/dL    Comment: Glucose reference range applies only to samples taken after fasting for at least 8 hours.  CBG monitoring, ED     Status: Abnormal   Collection Time: 10/05/20  6:57 PM  Result Value Ref Range   Glucose-Capillary 173 (H) 70 - 99 mg/dL    Comment: Glucose reference range applies only to samples taken after fasting for at least 8 hours.  I-stat chem 8, ED (not at [facility] or [facility])     Status: Abnormal   Collection Time: 10/05/20 10:52 PM  Result Value Ref Range   Sodium 139 135 - 145 mmol/L   Potassium 3.8 3.5 - 5.1 mmol/L   Chloride 102 98 - 111 mmol/L   BUN 25 (H) 6 - 20 mg/dL   Creatinine, Ser 4.09 (H) 0.61 - 1.24 mg/dL   Glucose, Bld 62 (L) 70 - 99 mg/dL    Comment: Glucose reference range applies only to samples taken after fasting for at least 8 hours.   Calcium, Ion 1.19 1.15 - 1.40 mmol/L   TCO2 28 22 - 32 mmol/L   Hemoglobin 17.0 13.0 - 17.0 g/dL   HCT 81.1 91.4 - 78.2 %  CBG monitoring, ED     Status: Abnormal   Collection Time: 10/06/20 12:57 AM  Result Value Ref Range   Glucose-Capillary 187 (H) 70 - 99  mg/dL    Comment: Glucose reference range applies only to samples taken after fasting for at least 8 hours.  POC CBG, ED     Status: Abnormal   Collection Time: 10/06/20  3:24 AM  Result Value Ref Range   Glucose-Capillary 243 (H) 70 - 99 mg/dL    Comment: Glucose reference range applies only to samples taken after fasting for at least 8 hours.  Glucose, capillary     Status: Abnormal   Collection Time: 10/06/20  4:15 AM  Result Value Ref Range   Glucose-Capillary 256 (H) 70 - 99 mg/dL    Comment: Glucose reference  range applies only to samples taken after fasting for at least 8 hours.  Glucose, capillary     Status: Abnormal   Collection Time: 10/06/20  6:22 AM  Result Value Ref Range   Glucose-Capillary 275 (H) 70 - 99 mg/dL    Comment: Glucose reference range applies only to samples taken after fasting for at least 8 hours.  Basic metabolic panel     Status: Abnormal   Collection Time: 10/06/20  6:27 AM  Result Value Ref Range   Sodium 133 (L) 135 - 145 mmol/L   Potassium 4.2 3.5 - 5.1 mmol/L   Chloride 99 98 - 111 mmol/L   CO2 28 22 - 32 mmol/L   Glucose, Bld 303 (H) 70 - 99 mg/dL    Comment: Glucose reference range applies only to samples taken after fasting for at least 8 hours.   BUN 27 (H) 6 - 20 mg/dL   Creatinine, Ser 7.20 (H) 0.61 - 1.24 mg/dL   Calcium 9.8 8.9 - 94.7 mg/dL   GFR, Estimated >09 >62 mL/min    Comment: (NOTE) Calculated using the CKD-EPI Creatinine Equation (2021)    Anion gap 6 5 - 15    Comment: Performed at [facility], [address] [place], [place] [address]  Glucose, capillary     Status: Abnormal   Collection Time: 10/06/20 11:49 AM  Result Value Ref Range   Glucose-Capillary 309 (H) 70 - 99 mg/dL    Comment: Glucose reference range applies only to samples taken after fasting for at least 8 hours.    Blood Alcohol level:  Lab Results  Component Value Date   ETH <10 09/24/2020   ETH <10 02/09/2020    Metabolic  Disorder Labs: Lab Results  Component Value Date   HGBA1C 9.7 (H) 09/09/2020   MPG 231.69 09/09/2020   MPG 205.86 02/11/2020   Lab Results  Component Value Date   PROLACTIN 6.0 09/24/2020   Lab Results  Component Value Date   CHOL 187 09/24/2020   TRIG 50 09/24/2020   HDL 100 09/24/2020   CHOLHDL 1.9 09/24/2020   VLDL 10 09/24/2020   LDLCALC 77 09/24/2020   LDLCALC 64 11/22/2019    Physical Findings:  Musculoskeletal: Strength & Muscle Tone: within normal limits Gait & Station: normal Patient leans: N/A   Psychiatric Specialty Exam: Physical Exam Vitals and nursing note reviewed.  HENT:     Head: Normocephalic and atraumatic.  Pulmonary:     Effort: Pulmonary effort is normal.  Neurological:     General: No focal deficit present.     Mental Status: He is alert and oriented to person, place, and time.    Review of Systems  Respiratory:  Negative for shortness of breath.   Cardiovascular:  Negative for chest pain.  Gastrointestinal:  Negative for abdominal pain, constipation, nausea and vomiting.  Neurological:  Negative for dizziness and headaches.  Psychiatric/Behavioral:  Positive for dysphoric mood.    Blood pressure 136/87, pulse 91, temperature 98.3 F (36.8 C), temperature source Oral, resp. rate 18, height 6' (1.829 m), weight 93 kg, SpO2 100 %.Body mass index is 27.8 kg/m.  General Appearance: Casual and appropriate hygeine, not wearing vision glasses today  Eye Contact:  Poor, looking mostly away or towards the ground  Speech: Clear and coherent, irritated tone, loud sighs and groans, normal rate, spontaneous  Volume: Normal  Mood:  dysphoric, irritable  Affect:  Congruent  Thought Process:  Coherent, Goal Directed, and Linear  Orientation:  Full (Time, Place, and Person)  Thought Content: Patient denied SI/HI/AVH, delusions, paranoia, first rank symptoms. Patient is not grossly responding to internal/external stimuli on exam and did not make  delusional statements.  Suicidal Thoughts:  No  Homicidal Thoughts:  No  Memory:  Immediate;   Good Recent;   Fair Remote;   Fair  Judgement:  Fair  Insight:  Fair  Psychomotor Activity:  Normal   Concentration:  Concentration: Fair and Attention Span: Fair  Recall:  [employer] of Knowledge:  Good  Language:  Good  Akathisia:  NA  Assets:  Communication Skills Desire for Improvement Resilience  ADL's:  Intact  Cognition:  WNL  Sleep: 6    Treatment Plan Summary: Daily contact with patient to assess and evaluate symptoms and progress in treatment and Medication management  [name] is a 35 y.o. adult, with reported past psychiatric history of MDD.  Past medical history of poorly controlled diabetes, HIV, hypertension.  Patient presents involuntarily to [facility] (09/25/2020), transferred from [facility] (09/24/2020) for SI daily for the last 5 to 10 years. [facility] stay day 11.     ASSESSMENT: Diagnosis:   - MDD (major depressive disorder), recurrent severe, without psychosis  - Diabetes mellitus type 1   - HIV infection  - Essential hypertension  PSYCHIATRIC DIAGNOSIS & TREATMENT:  MDD - Continue Zoloft 50 mg p.o. daily - Discontinue BuSpar 10 mg p.o. twice daily due to adverse reaction - Continue Glucerna shake liquid 237 mL p.o. 3 times daily between meals for decreased appetite due to depression -- Placed on room lock out for meals and groups   MEDICAL MANAGEMENT: Hypertension - Continue amlodipine 10 mg p.o. daily - Continue hydralazine 25 mg p.o. 3 times daily per recommendation of medicine  CMP: Na+ 133, Cr 1.44 (8/7) Repeat BMP tomorrow in the morning - Medicine consult following  Diabetes - Continue sliding scale  Lantus 15 units IM nightly  Novolog 3 units IM with meals per diabetic coordinator's /hospitalist recommendations- improved po intake encouraged Continue monitoring CBGs 4 times daily  HIV - Continue Genvoya 1 tablet  daily with breakfast  Elevated Creatinine  - Cr 1.44 (8/7), 1.34 (8/9) - Pushing po fluids and rechecking in morning  Hyponatremia (Na+ 133) - Most recent CMP showed Na+ 133 10/06/2020 - Will monitor BMP - could be related to fluctuating blood sugar control  Hyperkalemia (K+ 5.3)  RESOLVED - K+ 4.3 on 10/04/20  PRN's -Trazodone  PO qHS PRN for insomnia -Alum & mag hydroxide-simeth 30ml PO qHS PRN for GERD -Magnesium hydroxide 30ml PO daily PRN for constipation -Acetaminophen tablet  PO PRN q6hrs for mild pain  -Clonidine 0.1 mg p.o. 3 times daily as needed for SBP >150 -Zofran 8 mg p.o. every 8 hours as needed for nausea and vomiting  Discharge Planning: -Social work and case management to assist with discharge planning and identification of hospital follow-up needs prior to discharge - patient now wants to stay with mother at discharge and is considering [employer] -Estimated LOS: 2-3 days -Discharge Concerns: Need to establish a safety plan; Medication compliance and effectiveness -Discharge Goals: Return home with outpatient referrals for mental health follow-up including medication management/psychotherapy  Safety and Monitoring: -Involuntary admission to inpatient psychiatric unit for safety, stabilization and treatment -Daily contact with patient to assess and evaluate symptoms and progress in treatment -Patient's case to be discussed in multi-disciplinary team meeting -Observation Level : q15 minute checks -Vital signs: q12  hours -Precautions: suicide, elopement, and assault   Princess BruinsJulie Jerrol Helmers, DO, PGY-1 10/06/2020, 1:53 PM

## 2020-10-07 ENCOUNTER — Ambulatory Visit: Payer: No Typology Code available for payment source | Admitting: Nurse Practitioner

## 2020-10-07 LAB — GLUCOSE, CAPILLARY
Glucose-Capillary: 318 mg/dL — ABNORMAL HIGH (ref 70–99)
Glucose-Capillary: 76 mg/dL (ref 70–99)
Glucose-Capillary: 236 mg/dL — ABNORMAL HIGH (ref 70–99)
Glucose-Capillary: 179 mg/dL — ABNORMAL HIGH (ref 70–99)

## 2020-10-07 MED ORDER — GABAPENTIN 100 MG PO CAPS
100.0000 mg | ORAL_CAPSULE | Freq: Three times a day (TID) | ORAL | Status: DC
  Administered 2020-10-07 – 2020-10-08 (×3): 100 mg via ORAL
  Filled 2020-10-07: qty 21
  Filled 2020-10-07: qty 1
  Filled 2020-10-07: qty 21
  Filled 2020-10-07: qty 1
  Filled 2020-10-07: qty 21
  Filled 2020-10-07 (×3): qty 1
  Filled 2020-10-07 (×3): qty 21
  Filled 2020-10-07: qty 1

## 2020-10-07 MED ORDER — SERTRALINE HCL 25 MG PO TABS
75.0000 mg | ORAL_TABLET | Freq: Every day | ORAL | Status: DC
  Administered 2020-10-08: 75 mg via ORAL
  Filled 2020-10-07: qty 3
  Filled 2020-10-07 (×2): qty 21
  Filled 2020-10-07: qty 3

## 2020-10-07 NOTE — Tx Team (Signed)
Interdisciplinary Treatment and Diagnostic Plan Update  10/07/2020 Time of Session: 9:30am [name] MRN: [id number]  Principal Diagnosis: MDD (major depressive disorder), recurrent severe, without psychosis ([place])  Secondary Diagnoses: Principal Problem:   MDD (major depressive disorder), recurrent severe, without psychosis ([name]) Active Problems:   Diabetes mellitus type 1 ([name])   HIV infection ([name])   Essential hypertension   Current Medications:  Current Facility-Administered Medications  Medication Dose Route Frequency Provider Last Rate Last Admin   acetaminophen (TYLENOL) tablet 650 mg  650 mg Oral Q6H PRN Bobbitt, Shalon E, NP   650 mg at 10/04/20 1527   alum & mag hydroxide-simeth (MAALOX/MYLANTA) 200-200-20 MG/5ML suspension 30 mL  30 mL Oral Q4H PRN Bobbitt, Shalon E, NP       amLODipine (NORVASC) tablet 10 mg  10 mg Oral Daily Sharma Covert, MD   10 mg at 10/07/20 0748   cloNIDine (CATAPRES) tablet 0.1 mg  0.1 mg Oral Q8H PRN Viann Fish E, MD   0.1 mg at 10/01/20 1531   elvitegravir-cobicistat-emtricitabine-tenofovir (GENVOYA) 150-150-200-10 MG tablet 1 tablet  1 tablet Oral Q breakfast Bobbitt, Shalon E, NP   1 tablet at 10/07/20 0747   feeding supplement (GLUCERNA SHAKE) (GLUCERNA SHAKE) liquid 237 mL  237 mL Oral TID BM Sharma Covert, MD   237 mL at 10/03/20 2200   gabapentin (NEURONTIN) capsule 100 mg  100 mg Oral TID Merrily Brittle, DO   100 mg at 10/07/20 1228   hydrALAZINE (APRESOLINE) tablet 25 mg  25 mg Oral Q8H Kyle, Tyrone A, DO   25 mg at 10/07/20 0626   insulin aspart (novoLOG) injection 0-5 Units  0-5 Units Subcutaneous QHS Bobbitt, Shalon E, NP   5 Units at 10/04/20 2100   insulin aspart (novoLOG) injection 0-9 Units  0-9 Units Subcutaneous TID WC Viann Fish E, MD   7 Units at 10/07/20 6440   insulin aspart (novoLOG) injection 3 Units  3 Units Subcutaneous TID WC Barb Merino, MD   3 Units at 10/07/20 1232   insulin glargine  (LANTUS) injection 12 Units  12 Units Subcutaneous QHS Barb Merino, MD   12 Units at 10/06/20 2206   magnesium hydroxide (MILK OF MAGNESIA) suspension 30 mL  30 mL Oral Daily PRN Bobbitt, Shalon E, NP       ondansetron (ZOFRAN-ODT) disintegrating tablet 8 mg  8 mg Oral Q8H PRN Sharma Covert, MD   8 mg at 10/05/20 1803   [START ON 10/08/2020] sertraline (ZOLOFT) tablet 75 mg  75 mg Oral Daily Merrily Brittle, DO       traZODone (DESYREL) tablet 50 mg  50 mg Oral QHS PRN Bobbitt, Shalon E, NP   50 mg at 10/06/20 2157   PTA Medications: Medications Prior to Admission  Medication Sig Dispense Refill Last Dose   amLODipine (NORVASC) 5 MG tablet Take 5 mg by mouth daily.      blood glucose meter kit and supplies Dispense based on patient and insurance preference. Use up to four times daily as directed. (FOR ICD-10 E10.9, E11.9). (Patient taking differently: 1 each by Other route See admin instructions. Dispense based on patient and insurance preference. Use up to four times daily as directed. (FOR ICD-10 E10.9, E11.9).) 1 each 0    Blood Glucose Monitoring Suppl (TRUE METRIX METER) w/Device KIT USE AS DIRECTED (Patient taking differently: 1 each by Other route as directed.) 1 kit 0    citalopram (CELEXA) 20 MG tablet TAKE 1 TABLET BY  MOUTH DAILY 30 tablet 11    elvitegravir-cobicistat-emtricitabine-tenofovir (GENVOYA) 150-150-200-10 MG TABS tablet Take 1 tablet by mouth daily with breakfast. 30 tablet 11    glucose blood test strip USE AS DIRECTED (Patient taking differently: 1 each by Other route as directed.) 150 strip 0    insulin aspart (NOVOLOG) 100 UNIT/ML injection INJECT 5 UNITS INTO THE SKIN THREE TIMES DAILY WITH MEALS. (Patient taking differently: Inject 4 Units into the skin 3 (three) times daily with meals.) 10 mL 0    insulin glargine (LANTUS) 100 UNIT/ML injection INJECT 26 UNITS INTO THE SKIN DAILY. (Patient taking differently: Inject 35 Units into the skin daily. Note from 7/13  hospialization for hypoglycemia MD wanted to decrease Lantus to 18 U daily.  Pt reported to me he took 35 U bid. So unclear what does he should be on vs what he takes.) 10 mL 0    Insulin Syringe-Needle U-100 30G X 1/2" 0.5 ML MISC USE AS DIRECTED WITH INSULIN (Patient taking differently: 1 each by Other route See admin instructions. with insulin) 120 each 0    TRUEplus Lancets 28G MISC USE AS DIRECTED (Patient taking differently: 1 each by Other route as directed.) 200 each 0     Patient Stressors:    Patient Strengths:    Treatment Modalities: Medication Management, Group therapy, Case management,  1 to 1 session with clinician, Psychoeducation, Recreational therapy.   Physician Treatment Plan for Primary Diagnosis: MDD (major depressive disorder), recurrent severe, without psychosis ([name]) Long Term Goal(s): Improvement in symptoms so as ready for discharge   Short Term Goals: Ability to identify changes in lifestyle to reduce recurrence of condition will improve Ability to verbalize feelings will improve Ability to disclose and discuss suicidal ideas Ability to demonstrate self-control will improve Ability to identify and develop effective coping behaviors will improve Ability to maintain clinical measurements within normal limits will improve Compliance with prescribed medications will improve  Medication Management: Evaluate patient's response, side effects, and tolerance of medication regimen.  Therapeutic Interventions: 1 to 1 sessions, Unit Group sessions and Medication administration.  Evaluation of Outcomes: Progressing  Physician Treatment Plan for Secondary Diagnosis: Principal Problem:   MDD (major depressive disorder), recurrent severe, without psychosis ([place]) Active Problems:   Diabetes mellitus type 1 ([name])   HIV infection ([name])   Essential hypertension  Long Term Goal(s): Improvement in symptoms so as ready for discharge   Short Term Goals: Ability to identify  changes in lifestyle to reduce recurrence of condition will improve Ability to verbalize feelings will improve Ability to disclose and discuss suicidal ideas Ability to demonstrate self-control will improve Ability to identify and develop effective coping behaviors will improve Ability to maintain clinical measurements within normal limits will improve Compliance with prescribed medications will improve     Medication Management: Evaluate patient's response, side effects, and tolerance of medication regimen.  Therapeutic Interventions: 1 to 1 sessions, Unit Group sessions and Medication administration.  Evaluation of Outcomes: Progressing   RN Treatment Plan for Primary Diagnosis: MDD (major depressive disorder), recurrent severe, without psychosis ([place]) Long Term Goal(s): Knowledge of disease and therapeutic regimen to maintain health will improve  Short Term Goals: Ability to remain free from injury will improve, Ability to verbalize frustration and anger appropriately will improve, Ability to demonstrate self-control, Ability to participate in decision making will improve, Ability to verbalize feelings will improve, Ability to identify and develop effective coping behaviors will improve, and Compliance with prescribed medications will improve  Medication  Management: RN will administer medications as ordered by provider, will assess and evaluate patient's response and provide education to patient for prescribed medication. RN will report any adverse and/or side effects to prescribing provider.  Therapeutic Interventions: 1 on 1 counseling sessions, Psychoeducation, Medication administration, Evaluate responses to treatment, Monitor vital signs and CBGs as ordered, Perform/monitor CIWA, COWS, AIMS and Fall Risk screenings as ordered, Perform wound care treatments as ordered.  Evaluation of Outcomes: Progressing   LCSW Treatment Plan for Primary Diagnosis: MDD (major depressive disorder),  recurrent severe, without psychosis ([place]) Long Term Goal(s): Safe transition to appropriate next level of care at discharge, Engage patient in therapeutic group addressing interpersonal concerns.  Short Term Goals: Engage patient in aftercare planning with referrals and resources, Increase social support, Increase ability to appropriately verbalize feelings, Increase emotional regulation, Facilitate acceptance of mental health diagnosis and concerns, and Increase skills for wellness and recovery  Therapeutic Interventions: Assess for all discharge needs, 1 to 1 time with Social worker, Explore available resources and support systems, Assess for adequacy in community support network, Educate family and significant other(s) on suicide prevention, Complete Psychosocial Assessment, Interpersonal group therapy.  Evaluation of Outcomes: Progressing   Progress in Treatment: Attending groups: Yes. Participating in groups: Yes. Taking medication as prescribed: Yes. Toleration medication: Yes. Family/Significant other contact made: No, will contact:  pt declined consents Patient understands diagnosis: Yes. Discussing patient identified problems/goals with staff: Yes. Medical problems stabilized or resolved: Yes. Denies suicidal/homicidal ideation: Yes. Issues/concerns per patient self-inventory: No.   New problem(s) identified: No, Describe:  none  New Short Term/Long Term Goal(s):medication stabilization, elimination of SI thoughts, development of comprehensive mental wellness plan.    Patient Goals:   "To leave"  Discharge Plan or Barriers: Patient recently admitted. CSW will continue to follow and assess for appropriate referrals and possible discharge planning.    Reason for Continuation of Hospitalization: Depression Medication stabilization  Estimated Length of Stay: 3-5 days  Attendees: Patient: [name] 10/07/2020   Physician: Fatima Sanger, DO 10/07/2020   Nursing:   10/07/2020  RN Care Manager: 10/07/2020   Social Worker: Verdis Frederickson, Garza 10/07/2020   Recreational Therapist:  10/07/2020   Other:  10/07/2020   Other:  10/07/2020   Other: 10/07/2020    Scribe for Treatment Team: Darleen Crocker, LCSWA 10/07/2020 1:47 PM

## 2020-10-07 NOTE — [facility] Group Notes (Signed)
Due to COVID precautions on the unit, pts were given packet to complete individually.   Melba Coon, LCSWA

## 2020-10-07 NOTE — Progress Notes (Signed)
Pt stated he was doing as good as he could with all the restrictions on the unit. Pt visible some on the unit, pt seen working on a puzzle in his room. Pt given PRN Trazodone per [facility]    10/07/20 2300  Psych Admission Type (Psych Patients Only)  Admission Status Involuntary  Psychosocial Assessment  Patient Complaints None  Eye Contact Fair  Facial Expression Flat  Affect Flat  Speech Slow  Interaction Assertive  Motor Activity Slow  Appearance/Hygiene Unremarkable  Behavior Characteristics Cooperative  Mood Anxious  Thought Process  Coherency WDL  Content WDL  Delusions None reported or observed  Perception WDL  Hallucination None reported or observed  Judgment Poor  Confusion WDL  Danger to Self  Current suicidal ideation? Denies  Self-Injurious Behavior No self-injurious ideation or behavior indicators observed or expressed   Agreement Not to Harm Self Yes  Description of Agreement verbal  Danger to Others  Danger to Others None reported or observed

## 2020-10-07 NOTE — Progress Notes (Signed)
[facility] MD Progress Note  10/07/2020 10:56 AM [name]  MRN:  [id number]  CC: SI  Subjective:  [name] is a 35 y.o. adult, with reported past psychiatric history of MDD.  Past medical history of poorly controlled diabetes, HIV, hypertension.  Patient presents involuntarily to [facility] (09/25/2020), transferred from [facility] (09/24/2020) for SI daily for the last 5 to 10 years. [facility] stay day 12.   24hr events:  The patient's chart was reviewed and nursing notes were reviewed. The patient's case was discussed in multidisciplinary team meeting.  Per MAR: - Patient is compliant with scheduled meds.  - PRNs: Trazodone Patient had sudden symptoms of profuse diaphoresis, chest pain, nausea, headache, dizziness, during dinner.  The only new medication started was BuSpar and an hour prior to the event.  Patient was sent to [facility] ED where he was monitored and was returned to [facility] after stabilization.  Patient slept Number of Hours: 6.   Today (10/07/2020): The patient was seen and evaluated on the unit. Patient is seen and examined today.  Patient continues to report a negative attitude towards the world, stated that he always expects negative outcomes.  Stated that no medicine can help him, stated he feels hopeless and that a lot of things are out of his control. Patient reported intermittent neuropathy on the right foot, especially when it rains.  Discussed with patient possible benefits of starting gabapentin to alleviate his pain and for mild mood stabilization.  Patient was amenable to plan Stated that his sleep was "all right" and that his appetite is "fair". Patient denied symptoms of chest pain, shortness of breath, lightheadedness, dizziness, nausea, abdominal pain.   Encouraged patient to continue his increased p.o. fluid intake and to stay awake during the day. Patient denied SI/HI/AVH, delusions, paranoia, first rank symptoms.   Principal  Problem: MDD (major depressive disorder), recurrent severe, without psychosis (HCC) Diagnosis: Principal Problem:   MDD (major depressive disorder), recurrent severe, without psychosis (HCC) Active Problems:   Diabetes mellitus type 1 (HCC)   HIV infection (HCC)   Essential hypertension  Total Time spent with patient: 20 minutes  Past Psychiatric History:  Patient has had previous psychiatric admissions.  He is also had psychiatric evaluations when he has been admitted to the hospital for diabetic issues.  It does appear that he has been treated with several antidepressants in the past.  He has been on citalopram for at least 2 to 3 years.  He says it is never really been effective.  Past Medical History:  Past Medical History:  Diagnosis Date   Anxiety    Asthma    no prior hospitalizations, intubations   Depression    Headache    "only w/stress" (11/01/2017)   HIV (human immunodeficiency virus infection) (HCC)    Migraine    "a few/month" (11/01/2017)   Refusal of blood transfusions as patient is Jehovah's Witness    Seizure (HCC)    "only w/my low blood sugars"  (11/01/2017)   Type I diabetes mellitus (HCC)     Past Surgical History:  Procedure Laterality Date   FRACTURE SURGERY     IM NAILING TIBIA Right 11/01/2017   INTRAMEDULLARY (IM) NAIL TIBIALRightGeneral   ORIF ANKLE FRACTURE Right 11/01/2017   ORIF ANKLE FRACTURE Right 11/01/2017   Procedure: OPEN REDUCTION INTERNAL FIXATION (ORIF) ANKLE FRACTURE;  Surgeon: Roby Lofts, MD;  Location: [facility] OR;  Service: Orthopedics;  Laterality: Right;   TIBIA IM  NAIL INSERTION Right 11/01/2017   Procedure: INTRAMEDULLARY (IM) NAIL TIBIAL;  Surgeon: Roby Lofts, MD;  Location: [facility] OR;  Service: Orthopedics;  Laterality: Right;   Family History:  Family History  Problem Relation Age of Onset   Thyroid disease Mother    Diabetes Mother    Breast cancer Maternal Grandmother    Diabetes Maternal Grandmother    Family Psychiatric   History:  Patient was unsure about his father, but did state that he felt as though his mother had been treated for psychiatric illness in the past.  His father was killed by a secondary relative when he was very young.  Social History:  Social History   Substance and Sexual Activity  Alcohol Use Yes   Alcohol/week: 7.0 standard drinks   Types: 7 Cans of beer per week     Social History   Substance and Sexual Activity  Drug Use Not Currently   Frequency: 2.0 times per week   Types: Marijuana   Comment: 11/01/2017  "weekly"    Social History   Socioeconomic History   Marital status: Single    Spouse name: Not on file   Number of children: Not on file   Years of education: Not on file   Highest education level: Not on file  Occupational History   Not on file  Tobacco Use   Smoking status: Every Day    Types: Cigars    Start date: 09/29/2015   Smokeless tobacco: Never   Tobacco comments:    11/01/2017  "2 cigarettes in ~ 6 month "  Vaping Use   Vaping Use: Never used  Substance and Sexual Activity   Alcohol use: Yes    Alcohol/week: 7.0 standard drinks    Types: 7 Cans of beer per week   Drug use: Not Currently    Frequency: 2.0 times per week    Types: Marijuana    Comment: 11/01/2017  "weekly"   Sexual activity: Yes    Partners: Male    Comment: pt. declined condoms  Other Topics Concern   Not on file  Social History Narrative   Not on file   Social Determinants of Health   Financial Resource Strain: Not on file  Food Insecurity: Not on file  Transportation Needs: Not on file  Physical Activity: Not on file  Stress: Not on file  Social Connections: Not on file   Additional Social History:  Specify valuables returned:  (clothing, toiletries, misc items)  Sleep: Poor  Appetite: Fair  Current Medications: Current Facility-Administered Medications  Medication Dose Route Frequency Provider Last Rate Last Admin   acetaminophen (TYLENOL) tablet 650 mg  650 mg  Oral Q6H PRN Bobbitt, Shalon E, NP   650 mg at 10/04/20 1527   alum & mag hydroxide-simeth (MAALOX/MYLANTA) 200-200-20 MG/5ML suspension 30 mL  30 mL Oral Q4H PRN Bobbitt, Shalon E, NP       amLODipine (NORVASC) tablet 10 mg  10 mg Oral Daily Antonieta Pert, MD   10 mg at 10/07/20 0748   cloNIDine (CATAPRES) tablet 0.1 mg  0.1 mg Oral Q8H PRN Bartholomew Crews E, MD   0.1 mg at 10/01/20 1531   elvitegravir-cobicistat-emtricitabine-tenofovir (GENVOYA) 150-150-200-10 MG tablet 1 tablet  1 tablet Oral Q breakfast Bobbitt, Shalon E, NP   1 tablet at 10/07/20 0747   feeding supplement (GLUCERNA SHAKE) (GLUCERNA SHAKE) liquid 237 mL  237 mL Oral TID BM Antonieta Pert, MD   237 mL at 10/03/20 2200  gabapentin (NEURONTIN) capsule 100 mg  100 mg Oral TID Princess BruinsNguyen, Delmos Velaquez, DO       hydrALAZINE (APRESOLINE) tablet 25 mg  25 mg Oral Q8H Kyle, Tyrone A, DO   25 mg at 10/07/20 0626   insulin aspart (novoLOG) injection 0-5 Units  0-5 Units Subcutaneous QHS Bobbitt, Shalon E, NP   5 Units at 10/04/20 2100   insulin aspart (novoLOG) injection 0-9 Units  0-9 Units Subcutaneous TID WC Comer LocketSingleton, Amy E, MD   7 Units at 10/07/20 [id number]   insulin aspart (novoLOG) injection 3 Units  3 Units Subcutaneous TID WC Dorcas CarrowGhimire, Kuber, MD   3 Units at 10/07/20 [id number]   insulin glargine (LANTUS) injection 12 Units  12 Units Subcutaneous QHS Dorcas CarrowGhimire, Kuber, MD   12 Units at 10/06/20 2206   magnesium hydroxide (MILK OF MAGNESIA) suspension 30 mL  30 mL Oral Daily PRN Bobbitt, Shalon E, NP       ondansetron (ZOFRAN-ODT) disintegrating tablet 8 mg  8 mg Oral Q8H PRN Antonieta Pertlary, Greg Lawson, MD   8 mg at 10/05/20 1803   [START ON 10/08/2020] sertraline (ZOLOFT) tablet 75 mg  75 mg Oral Daily Princess BruinsNguyen, Brexley Cutshaw, DO       traZODone (DESYREL) tablet 50 mg  50 mg Oral QHS PRN Bobbitt, Shalon E, NP   50 mg at 10/06/20 2157    Lab Results:  Results for orders placed or performed during the hospital encounter of 09/25/20 (from the past 48 hour(s))   Glucose, capillary     Status: Abnormal   Collection Time: 10/05/20 12:02 PM  Result Value Ref Range   Glucose-Capillary 360 (H) 70 - 99 mg/dL    Comment: Glucose reference range applies only to samples taken after fasting for at least 8 hours.   Comment 1 Notify RN   Glucose, capillary     Status: Abnormal   Collection Time: 10/05/20  5:09 PM  Result Value Ref Range   Glucose-Capillary 397 (H) 70 - 99 mg/dL    Comment: Glucose reference range applies only to samples taken after fasting for at least 8 hours.   Comment 1 Notify RN   Glucose, capillary     Status: Abnormal   Collection Time: 10/05/20  5:58 PM  Result Value Ref Range   Glucose-Capillary 272 (H) 70 - 99 mg/dL    Comment: Glucose reference range applies only to samples taken after fasting for at least 8 hours.  CBG monitoring, ED     Status: Abnormal   Collection Time: 10/05/20  6:57 PM  Result Value Ref Range   Glucose-Capillary 173 (H) 70 - 99 mg/dL    Comment: Glucose reference range applies only to samples taken after fasting for at least 8 hours.  I-stat chem 8, ED (not at [facility] or [facility])     Status: Abnormal   Collection Time: 10/05/20 10:52 PM  Result Value Ref Range   Sodium 139 135 - 145 mmol/L   Potassium 3.8 3.5 - 5.1 mmol/L   Chloride 102 98 - 111 mmol/L   BUN 25 (H) 6 - 20 mg/dL   Creatinine, Ser 5.401.40 (H) 0.61 - 1.24 mg/dL   Glucose, Bld 62 (L) 70 - 99 mg/dL    Comment: Glucose reference range applies only to samples taken after fasting for at least 8 hours.   Calcium, Ion 1.19 1.15 - 1.40 mmol/L   TCO2 28 22 - 32 mmol/L   Hemoglobin 17.0 13.0 - 17.0 g/dL   HCT 98.150.0 19.139.0 -  52.0 %  CBG monitoring, ED     Status: Abnormal   Collection Time: 10/06/20 12:57 AM  Result Value Ref Range   Glucose-Capillary 187 (H) 70 - 99 mg/dL    Comment: Glucose reference range applies only to samples taken after fasting for at least 8 hours.  POC CBG, ED     Status: Abnormal   Collection Time: 10/06/20  3:24 AM  Result  Value Ref Range   Glucose-Capillary 243 (H) 70 - 99 mg/dL    Comment: Glucose reference range applies only to samples taken after fasting for at least 8 hours.  Glucose, capillary     Status: Abnormal   Collection Time: 10/06/20  4:15 AM  Result Value Ref Range   Glucose-Capillary 256 (H) 70 - 99 mg/dL    Comment: Glucose reference range applies only to samples taken after fasting for at least 8 hours.  Glucose, capillary     Status: Abnormal   Collection Time: 10/06/20  6:22 AM  Result Value Ref Range   Glucose-Capillary 275 (H) 70 - 99 mg/dL    Comment: Glucose reference range applies only to samples taken after fasting for at least 8 hours.  Basic metabolic panel     Status: Abnormal   Collection Time: 10/06/20  6:27 AM  Result Value Ref Range   Sodium 133 (L) 135 - 145 mmol/L   Potassium 4.2 3.5 - 5.1 mmol/L   Chloride 99 98 - 111 mmol/L   CO2 28 22 - 32 mmol/L   Glucose, Bld 303 (H) 70 - 99 mg/dL    Comment: Glucose reference range applies only to samples taken after fasting for at least 8 hours.   BUN 27 (H) 6 - 20 mg/dL   Creatinine, Ser 2.62 (H) 0.61 - 1.24 mg/dL   Calcium 9.8 8.9 - 03.5 mg/dL   GFR, Estimated >59 >74 mL/min    Comment: (NOTE) Calculated using the CKD-EPI Creatinine Equation (2021)    Anion gap 6 5 - 15    Comment: Performed at [facility] [place] [facility], [address] [place], [place] [address]  Glucose, capillary     Status: Abnormal   Collection Time: 10/06/20 11:49 AM  Result Value Ref Range   Glucose-Capillary 309 (H) 70 - 99 mg/dL    Comment: Glucose reference range applies only to samples taken after fasting for at least 8 hours.  Resp Panel by RT-PCR (Flu A&B, Covid) Nasopharyngeal Swab     Status: None   Collection Time: 10/06/20  3:47 PM   Specimen: Nasopharyngeal Swab; Nasopharyngeal(NP) swabs in vial transport medium  Result Value Ref Range   SARS Coronavirus 2 by RT PCR NEGATIVE NEGATIVE    Comment: (NOTE) SARS-CoV-2 target  nucleic acids are NOT DETECTED.  The SARS-CoV-2 RNA is generally detectable in upper respiratory specimens during the acute phase of infection. The lowest concentration of SARS-CoV-2 viral copies this assay can detect is 138 copies/mL. A negative result does not preclude SARS-Cov-2 infection and should not be used as the sole basis for treatment or other patient management decisions. A negative result may occur with  improper specimen collection/handling, submission of specimen other than nasopharyngeal swab, presence of viral mutation(s) within the areas targeted by this assay, and inadequate number of viral copies(<138 copies/mL). A negative result must be combined with clinical observations, patient history, and epidemiological information. The expected result is Negative.  Fact Sheet for Patients:  BloggerCourse.com  Fact Sheet for Healthcare Providers:  SeriousBroker.it  This test  is no t yet approved or cleared by the [place] and  has been authorized for detection and/or diagnosis of SARS-CoV-2 by FDA under an Emergency Use Authorization (EUA). This EUA will remain  in effect (meaning this test can be used) for the duration of the COVID-19 declaration under Section 564(b)(1) of the Act, 21 U.S.C.section 360bbb-3(b)(1), unless the authorization is terminated  or revoked sooner.       Influenza A by PCR NEGATIVE NEGATIVE   Influenza B by PCR NEGATIVE NEGATIVE    Comment: (NOTE) The Xpert Xpress SARS-CoV-2/FLU/RSV plus assay is intended as an aid in the diagnosis of influenza from Nasopharyngeal swab specimens and should not be used as a sole basis for treatment. Nasal washings and aspirates are unacceptable for Xpert Xpress SARS-CoV-2/FLU/RSV testing.  Fact Sheet for Patients: BloggerCourse.com  Fact Sheet for Healthcare Providers: SeriousBroker.it  This test  is not yet approved or cleared by the [place] FDA and has been authorized for detection and/or diagnosis of SARS-CoV-2 by FDA under an Emergency Use Authorization (EUA). This EUA will remain in effect (meaning this test can be used) for the duration of the COVID-19 declaration under Section 564(b)(1) of the Act, 21 U.S.C. section 360bbb-3(b)(1), unless the authorization is terminated or revoked.  Performed at [facility], [address], [place], [place] [address]   Glucose, capillary     Status: Abnormal   Collection Time: 10/06/20  5:06 PM  Result Value Ref Range   Glucose-Capillary 213 (H) 70 - 99 mg/dL    Comment: Glucose reference range applies only to samples taken after fasting for at least 8 hours.   Comment 1 Notify RN   Glucose, capillary     Status: None   Collection Time: 10/06/20  8:03 PM  Result Value Ref Range   Glucose-Capillary 80 70 - 99 mg/dL    Comment: Glucose reference range applies only to samples taken after fasting for at least 8 hours.  Glucose, capillary     Status: Abnormal   Collection Time: 10/07/20  5:40 AM  Result Value Ref Range   Glucose-Capillary 318 (H) 70 - 99 mg/dL    Comment: Glucose reference range applies only to samples taken after fasting for at least 8 hours.    Blood Alcohol level:  Lab Results  Component Value Date   ETH <10 09/24/2020   ETH <10 02/09/2020    Metabolic Disorder Labs: Lab Results  Component Value Date   HGBA1C 9.7 (H) 09/09/2020   MPG 231.69 09/09/2020   MPG 205.86 02/11/2020   Lab Results  Component Value Date   PROLACTIN 6.0 09/24/2020   Lab Results  Component Value Date   CHOL 187 09/24/2020   TRIG 50 09/24/2020   HDL 100 09/24/2020   CHOLHDL 1.9 09/24/2020   VLDL 10 09/24/2020   LDLCALC 77 09/24/2020   LDLCALC 64 11/22/2019    Physical Findings:  Musculoskeletal: Strength & Muscle Tone: within normal limits Gait & Station: normal Patient leans: N/A   Psychiatric  Specialty Exam: Physical Exam Vitals and nursing note reviewed.  HENT:     Head: Normocephalic and atraumatic.  Pulmonary:     Effort: Pulmonary effort is normal.  Neurological:     General: No focal deficit present.     Mental Status: He is alert and oriented to person, place, and time.    Review of Systems  Respiratory:  Negative for shortness of breath.   Cardiovascular:  Negative for chest pain.  Gastrointestinal:  Negative for abdominal pain, constipation, nausea and vomiting.  Neurological:  Negative for dizziness and headaches.  Psychiatric/Behavioral:  Positive for dysphoric mood.    Blood pressure (!) 131/97, pulse 90, temperature 98.3 F (36.8 C), temperature source Oral, resp. rate 18, height 6' (1.829 m), weight 93 kg, SpO2 100 %.Body mass index is 27.8 kg/m.  General Appearance: Casual and appropriate hygeine, not wearing vision glasses today  Eye Contact:  Poor, looking mostly away or towards the ground  Speech: Clear and coherent, irritated tone, normal rate, spontaneous  Volume: Normal  Mood:  dysphoric, less irritable than yesterday  Affect:  Congruent  Thought Process:  Coherent, Goal Directed, and Linear   Orientation:  Full (Time, Place, and Person)  Thought Content: Patient denied SI/HI/AVH, delusions, paranoia, first rank symptoms. Patient is not grossly responding to internal/external stimuli on exam and did not make delusional statements.  Suicidal Thoughts:  No  Homicidal Thoughts:  No  Memory:  Immediate;   Good Recent;   Fair Remote;   Fair  Judgement:  Fair  Insight:  Fair  Psychomotor Activity:  Normal   Concentration:  Concentration: Fair and Attention Span: Fair  Recall:  [employer] of Knowledge:  Good  Language:  Good  Akathisia:  NA  Assets:  Communication Skills Desire for Improvement Resilience  ADL's:  Intact  Cognition:  WNL  Sleep: 6    Treatment Plan Summary: Daily contact with patient to assess and evaluate symptoms and  progress in treatment and Medication management  [name] is a 35 y.o. adult, with reported past psychiatric history of MDD.  Past medical history of poorly controlled diabetes, HIV, hypertension.  Patient presents involuntarily to [facility] (09/25/2020), transferred from [facility] (09/24/2020) for SI daily for the last 5 to 10 years. [facility] stay day 12.     ASSESSMENT: Diagnosis:   - MDD (major depressive disorder), recurrent severe, without psychosis  - Diabetes mellitus type 1   - HIV infection  - Essential hypertension  PSYCHIATRIC DIAGNOSIS & TREATMENT:  MDD - Increase Zoloft 50 mg p.o. daily to 75 mg p.o. daily - Continue Glucerna shake liquid 237 mL p.o. 3 times daily between meals for decreased appetite due to depression -- Placed on room lock out for meals and groups - Start gabapentin 100 mg p.o. 3 times daily   MEDICAL MANAGEMENT: Hypertension - Continue amlodipine 10 mg p.o. daily - Continue hydralazine 25 mg p.o. 3 times daily per recommendation of medicine  CMP: Cr 1.44 (8/7), 1.34 (8/9) - Medicine consult following  Diabetes - Continue sliding scale  Lantus 15 units IM nightly  Novolog 3 units IM with meals per diabetic coordinator's /hospitalist recommendations- improved po intake encouraged Continue monitoring CBGs 4 times daily  HIV - Continue Genvoya 1 tablet daily with breakfast  Elevated Creatinine  - Cr 1.44 (8/7), 1.34 (8/9) - Pushing po fluids and rechecking  Hyponatremia (Na+ 133) - CMP showed Na+ 133 (8/9) Could be related to fluctuating blood sugar control, will continue to monitor  Hyperkalemia (K+ 5.3)  RESOLVED - K+ 4.3 on 10/04/20  PRN's -Trazodone 50mg  PO qHS PRN for insomnia -Alum & mag hydroxide-simeth 68ml PO qHS PRN for GERD -Magnesium hydroxide 43ml PO daily PRN for constipation -Acetaminophen tablet 650mg  PO PRN q6hrs for mild pain  -Clonidine 0.1 mg p.o. 3 times daily as needed for SBP >150 -Zofran 8 mg  p.o. every 8 hours as needed for nausea and vomiting  Discharge  Planning: -Social work and case management to assist with discharge planning and identification of hospital follow-up needs prior to discharge - patient now wants to stay with mother at discharge and is considering [employer] -Estimated LOS: 2-3 days -Discharge Concerns: Need to establish a safety plan; Medication compliance and effectiveness -Discharge Goals: Return home with outpatient referrals for mental health follow-up including medication management/psychotherapy  Safety and Monitoring: -Involuntary admission to inpatient psychiatric unit for safety, stabilization and treatment -Daily contact with patient to assess and evaluate symptoms and progress in treatment -Patient's case to be discussed in multi-disciplinary team meeting -Observation Level : q15 minute checks -Vital signs: q12 hours -Precautions: suicide, elopement, and assault   Princess Bruins, DO, PGY-1 10/07/2020, 10:56 AM

## 2020-10-07 NOTE — Progress Notes (Signed)
Adult Psychoeducational Group Note  Date:  10/07/2020 Time:  4:11 PM  Group Topic/Focus:  Building Self Esteem:   The Focus of this group is helping patients become aware of the effects of self-esteem on their lives, the things they and others do that enhance or undermine their self-esteem, seeing the relationship between their level of self-esteem and the choices they make and learning ways to enhance self-esteem.  Participation Level:  Active  Participation Quality:  Appropriate  Affect:  Appropriate  Cognitive:  Appropriate  Insight: Appropriate  Engagement in Group:  Engaged  Modes of Intervention:  Discussion  Additional Comments:  Patient attended Self-esteem group and participated.   Carisha Kantor W Taeler Winning 10/07/2020, 4:11 PM

## 2020-10-07 NOTE — Progress Notes (Signed)
Pt has been alert and oriented to person, place, time and situation. Pt is calm, cooperative, pleasant, talked on the phone often, affect is brighter at times, mood is good at times, at other times, pt is irritable. PT is medication complaint, denies SI/HI/AVH. Pt's appetite is good. No distress noted, none reported, pt voices no complaints. Will continue to monitor pt per Q15 minute face checks and monitor for safety and progress.

## 2020-10-07 NOTE — Progress Notes (Signed)
The patient attended the evening N.A.meeting and was appropriate.  

## 2020-10-07 NOTE — Progress Notes (Signed)
Chart reviewed . Discussed with attending physician. His blood pressures are stable on current regimen. Continue.  His blood sugars are acceptable with some dips, lowest to 80. Can continue current dose of insulin regimen and accept some degree of hyperglycemia due to brittle nature of diabetes/ diet and medication inconsistencies.   Thank you for consult. [facility] will sign off . Please Call back if needed. I will be available for secure chat until 7PM today if any questions.

## 2020-10-08 LAB — GLUCOSE, CAPILLARY: Glucose-Capillary: 277 mg/dL — ABNORMAL HIGH (ref 70–99)

## 2020-10-08 MED ORDER — HYDRALAZINE HCL 25 MG PO TABS
25.0000 mg | ORAL_TABLET | Freq: Three times a day (TID) | ORAL | 0 refills | Status: DC
  Filled 2020-10-19: qty 60, 20d supply, fill #0

## 2020-10-08 MED ORDER — TRUE METRIX METER W/DEVICE KIT
PACK | 0 refills | Status: AC
  Filled 2020-10-19: qty 1, 365d supply, fill #0

## 2020-10-08 MED ORDER — BLOOD GLUCOSE METER KIT: PACK | 0 refills | Status: DC

## 2020-10-08 MED ORDER — TRUEPLUS LANCETS 28G MISC
0 refills | Status: AC
  Filled 2020-10-19: qty 100, 25d supply, fill #0

## 2020-10-08 MED ORDER — GABAPENTIN 100 MG PO CAPS
100.0000 mg | ORAL_CAPSULE | Freq: Three times a day (TID) | ORAL | 0 refills | Status: DC
  Filled 2020-10-19: qty 90, 30d supply, fill #0

## 2020-10-08 MED ORDER — INSULIN GLARGINE 100 UNIT/ML ~~LOC~~ SOLN
12.0000 [IU] | Freq: Every day | SUBCUTANEOUS | 0 refills | Status: DC
  Filled 2020-10-19: qty 10, 28d supply, fill #0

## 2020-10-08 MED ORDER — GLUCOSE BLOOD VI STRP
ORAL_STRIP | 0 refills | Status: AC
  Filled 2020-10-19: qty 100, 25d supply, fill #0

## 2020-10-08 MED ORDER — SERTRALINE HCL 25 MG PO TABS
75.0000 mg | ORAL_TABLET | Freq: Every day | ORAL | 0 refills | Status: DC
  Filled 2020-10-19: qty 30, 10d supply, fill #0

## 2020-10-08 MED ORDER — INSULIN ASPART 100 UNIT/ML IJ SOLN
3.0000 [IU] | Freq: Three times a day (TID) | INTRAMUSCULAR | 0 refills | Status: DC
  Filled 2020-10-19: qty 10, 28d supply, fill #0

## 2020-10-08 MED ORDER — "INSULIN SYRINGE-NEEDLE U-100 30G X 5/16"" 0.5 ML MISC"
0 refills | Status: DC
  Filled 2020-10-19: qty 100, 25d supply, fill #0

## 2020-10-08 MED ORDER — AMLODIPINE BESYLATE 10 MG PO TABS
10.0000 mg | ORAL_TABLET | Freq: Every day | ORAL | 0 refills | Status: DC
  Filled 2020-10-19: qty 30, 30d supply, fill #0

## 2020-10-08 NOTE — Progress Notes (Signed)
RN met with pt and reviewed pt's discharge instructions.  Pt verbalized understanding of discharge instructions and pt did not have any questions. RN reviewed and provided pt with a copy of SRA, AVS and Transition Record.  RN returned pt's belongings to pt.  Paper prescriptions and medication samples were given to pt.  Pt denied SI/HI/AVH and voiced no concerns.  Pt was appreciative of the care pt received at [facility].  Patient discharged to the lobby without incident.  

## 2020-10-08 NOTE — Progress Notes (Signed)
  [name] Adult Case Management Discharge Plan :  Will you be returning to the same living situation after discharge:  Yes,  IRC/Shelter At discharge, do you have transportation home?: Yes,  Safe Transport  Do you have the ability to pay for your medications: Yes,  Community   Release of information consent forms completed and in the chart;  Patient's signature needed at discharge.  Patient to Follow up at:  Follow-up Information     [facility] for Infectious Disease. Go on 10/06/2020.   Specialty: Infectious Diseases Why: You have an appointment for therapy services on 10/06/20 at 10:00 am. You also have appointments with this provider on 10/21/20 at 11:30 am for labs, and on 11/04/20 at 11:30 am for medication management. Contact information: [address] [place], [address] [id number] mc [place] [address] [phone]        [name], NP Follow up on 10/30/2020.   Specialty: Nurse Practitioner Why: You have an appointment with this provider on 10/30/20 at 1:30 pm. Contact information: [address] [place] [address] [phone]         Project, [facility]. Call.   Why: Please contact this provider to assist with housing and shelter resources. Contact information: [address] [place] [address] [phone]                 Next level of care provider has access to [facility] Link:yes  Safety Planning and Suicide Prevention discussed: Yes,  With patient      Has patient been referred to the Quitline?: Patient refused referral  Patient has been referred for addiction treatment: N/A  [name], LCSWA 10/08/2020, 9:37 AM
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
## 2020-10-19 ENCOUNTER — Other Ambulatory Visit: Payer: Self-pay

## 2020-10-19 MED ORDER — GLUCOSE BLOOD VI STRP: ORAL_STRIP | 0 refills | Status: DC

## 2020-10-19 MED ORDER — TRUEPLUS LANCETS 28G MISC: 0 refills | Status: DC

## 2020-10-20 ENCOUNTER — Encounter: Payer: Self-pay | Admitting: Internal Medicine

## 2020-10-21 ENCOUNTER — Other Ambulatory Visit: Payer: Self-pay

## 2020-10-21 DIAGNOSIS — F32A Depression, unspecified: Secondary | ICD-10-CM

## 2020-10-21 DIAGNOSIS — E1021 Type 1 diabetes mellitus with diabetic nephropathy: Secondary | ICD-10-CM

## 2020-10-21 DIAGNOSIS — Z21 Asymptomatic human immunodeficiency virus [HIV] infection status: Secondary | ICD-10-CM

## 2020-10-22 LAB — T-HELPER CELL (CD4) - (RCID CLINIC ONLY)
CD4 % Helper T Cell: 35 % (ref 33–65)
CD4 T Cell Abs: 535 /uL (ref 400–1790)

## 2020-10-23 LAB — COMPREHENSIVE METABOLIC PANEL
AG Ratio: 1.6 (calc) (ref 1.0–2.5)
ALT: 15 U/L (ref 9–46)
AST: 14 U/L (ref 10–40)
Albumin: 4 g/dL (ref 3.6–5.1)
Alkaline phosphatase (APISO): 61 U/L (ref 36–130)
BUN/Creatinine Ratio: 13 (calc) (ref 6–22)
BUN: 17 mg/dL (ref 7–25)
CO2: 25 mmol/L (ref 20–32)
Calcium: 9 mg/dL (ref 8.6–10.3)
Chloride: 104 mmol/L (ref 98–110)
Creat: 1.27 mg/dL — ABNORMAL HIGH (ref 0.60–1.26)
Globulin: 2.5 g/dL (calc) (ref 1.9–3.7)
Glucose, Bld: 285 mg/dL — ABNORMAL HIGH (ref 65–139)
Potassium: 4.4 mmol/L (ref 3.5–5.3)
Sodium: 136 mmol/L (ref 135–146)
Total Bilirubin: 0.4 mg/dL (ref 0.2–1.2)
Total Protein: 6.5 g/dL (ref 6.1–8.1)

## 2020-10-23 LAB — FLUORESCENT TREPONEMAL AB(FTA)-IGG-BLD: Fluorescent Treponemal ABS: REACTIVE — AB

## 2020-10-23 LAB — LIPID PANEL
Cholesterol: 154 mg/dL (ref <200)
HDL: 71 mg/dL (ref >=40)
LDL Cholesterol (Calc): 64 mg/dL (calc)
Non-HDL Cholesterol (Calc): 83 mg/dL (calc) (ref <130)
Total CHOL/HDL Ratio: 2.2 (calc) (ref <5.0)
Triglycerides: 102 mg/dL (ref <150)

## 2020-10-23 LAB — CBC
HCT: 40.4 % (ref 38.5–50.0)
Hemoglobin: 13.8 g/dL (ref 13.2–17.1)
MCH: 31.3 pg (ref 27.0–33.0)
MCHC: 34.2 g/dL (ref 32.0–36.0)
MCV: 91.6 fL (ref 80.0–100.0)
MPV: 9.4 fL (ref 7.5–12.5)
Platelets: 305 10*3/uL (ref 140–400)
RBC: 4.41 10*6/uL (ref 4.20–5.80)
RDW: 12.8 % (ref 11.0–15.0)
WBC: 6.1 10*3/uL (ref 3.8–10.8)

## 2020-10-23 LAB — HIV-1 RNA QUANT-NO REFLEX-BLD
HIV 1 RNA Quant: NOT DETECTED Copies/mL
HIV-1 RNA Quant, Log: NOT DETECTED Log cps/mL

## 2020-10-23 LAB — RPR: RPR Ser Ql: REACTIVE — AB

## 2020-10-23 LAB — RPR TITER: RPR Titer: 1:32 {titer} — ABNORMAL HIGH

## 2020-10-26 ENCOUNTER — Telehealth: Payer: Self-pay

## 2020-10-26 NOTE — Telephone Encounter (Signed)
Patient notified of +RPR and scheduled for bicillin 2.4 million units x1 per Dr. Earlene Plater. Scheduled 8/30  Rosanna Randy, RN

## 2020-10-26 NOTE — Telephone Encounter (Signed)
Please see RPR results. Need orders for treatment. Thanks!

## 2020-10-27 ENCOUNTER — Other Ambulatory Visit: Payer: Self-pay

## 2020-10-27 ENCOUNTER — Ambulatory Visit (INDEPENDENT_AMBULATORY_CARE_PROVIDER_SITE_OTHER): Payer: Self-pay

## 2020-10-27 DIAGNOSIS — A539 Syphilis, unspecified: Secondary | ICD-10-CM

## 2020-10-27 MED ORDER — PENICILLIN G BENZATHINE 1200000 UNIT/2ML IM SUSY
1.2000 10*6.[IU] | PREFILLED_SYRINGE | Freq: Once | INTRAMUSCULAR | Status: AC
Start: 2020-10-27 — End: 2020-10-27
  Administered 2020-10-27: 1.2 10*6.[IU] via INTRAMUSCULAR

## 2020-10-27 NOTE — Progress Notes (Signed)
Patient came into office for today for syphillis treatment. Patient denies any allergies to penicillin.  Patient tolerated injections well. Encouraged patient to stay active to help with soreness on backside. Will take Tylenol/ ibuprofen if needed.  Encouraged patient to use condoms for all sexual encounters to avoid STD infections. Will contact recent partners to get tested/treated at local HD.  Juanita Laster, RMA

## 2020-10-28 NOTE — Progress Notes (Signed)
Please refer to encounter note from today 10/30/2020.

## 2020-10-29 ENCOUNTER — Other Ambulatory Visit: Payer: Self-pay

## 2020-10-30 ENCOUNTER — Ambulatory Visit (INDEPENDENT_AMBULATORY_CARE_PROVIDER_SITE_OTHER): Payer: Self-pay | Admitting: Family

## 2020-10-30 ENCOUNTER — Inpatient Hospital Stay: Payer: Self-pay | Admitting: Family

## 2020-10-30 ENCOUNTER — Other Ambulatory Visit: Payer: Self-pay

## 2020-10-30 ENCOUNTER — Encounter: Payer: Self-pay | Admitting: Family

## 2020-10-30 ENCOUNTER — Encounter: Payer: No Typology Code available for payment source | Admitting: Family

## 2020-10-30 VITALS — BP 117/81 | HR 79 | Temp 97.9°F | Resp 18 | Ht 72.56 in | Wt 194.2 lb

## 2020-10-30 DIAGNOSIS — Z7689 Persons encountering health services in other specified circumstances: Secondary | ICD-10-CM

## 2020-10-30 DIAGNOSIS — I10 Essential (primary) hypertension: Secondary | ICD-10-CM

## 2020-10-30 DIAGNOSIS — E1021 Type 1 diabetes mellitus with diabetic nephropathy: Secondary | ICD-10-CM

## 2020-10-30 DIAGNOSIS — Z09 Encounter for follow-up examination after completed treatment for conditions other than malignant neoplasm: Secondary | ICD-10-CM

## 2020-10-30 DIAGNOSIS — F332 Major depressive disorder, recurrent severe without psychotic features: Secondary | ICD-10-CM

## 2020-10-30 DIAGNOSIS — B20 Human immunodeficiency virus [HIV] disease: Secondary | ICD-10-CM

## 2020-10-30 MED ORDER — SERTRALINE HCL 25 MG PO TABS: 75.0000 mg | ORAL_TABLET | Freq: Every day | ORAL | 0 refills | Status: DC

## 2020-10-30 MED ORDER — INSULIN GLARGINE 100 UNIT/ML ~~LOC~~ SOLN: 12.0000 [IU] | Freq: Every day | SUBCUTANEOUS | 0 refills | Status: DC

## 2020-10-30 MED ORDER — AMLODIPINE BESYLATE 10 MG PO TABS: 10.0000 mg | ORAL_TABLET | Freq: Every day | ORAL | 0 refills | Status: DC

## 2020-10-30 MED ORDER — INSULIN ASPART 100 UNIT/ML IJ SOLN: 3.0000 [IU] | Freq: Three times a day (TID) | INTRAMUSCULAR | 0 refills | Status: DC

## 2020-10-30 MED ORDER — GABAPENTIN 100 MG PO CAPS: 100.0000 mg | ORAL_CAPSULE | Freq: Three times a day (TID) | ORAL | 2 refills | Status: DC

## 2020-10-30 MED ORDER — "INSULIN SYRINGE-NEEDLE U-100 30G X 5/16"" 0.5 ML MISC": 1.0000 | 0 refills | Status: DC

## 2020-10-30 MED ORDER — HYDRALAZINE HCL 25 MG PO TABS: 25.0000 mg | ORAL_TABLET | Freq: Three times a day (TID) | ORAL | 2 refills | Status: DC
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
## 2020-10-30 NOTE — Patient Instructions (Signed)
Thank you for choosing Primary Care at [facility] for your medical home!    [name] was seen by [name], NP today.   [name]'s primary care provider is [name], NP.   For the best care possible,  you should try to see Ricky Stabs, NP whenever you come to clinic.   We look forward to seeing you again soon!  If you have any questions about your visit today,  please call us at [phone]  Or feel free to reach your provider via MyChart.     Keeping you healthy   Get these tests Blood pressure- Have your blood pressure checked once a year by your healthcare provider.  Normal blood pressure is 120/80. Weight- Have your body mass index (BMI) calculated to screen for obesity.  BMI is a measure of body fat based on height and weight. You can also calculate your own BMI at https://www.west-esparza.com/. Cholesterol- Have your cholesterol checked regularly starting at age 10, sooner may be necessary if you have diabetes, high blood pressure, if a family member developed heart diseases at an early age or if you smoke.  Chlamydia, HIV, and other sexual transmitted disease- Get screened each year until the age of 46 then within three months of each new sexual partner. Diabetes- Have your blood sugar checked regularly if you have high blood pressure, high cholesterol, a family history of diabetes or if you are overweight.   Get these vaccines Flu shot- Every fall. Tetanus shot- Every 10 years. Menactra- Single dose; prevents meningitis.   Take these steps Don't smoke- If you do smoke, ask your healthcare provider about quitting. For tips on how to quit, go to www.smokefree.gov or call 1-800-QUIT-NOW. Be physically active- Exercise 5 days a week for at least 30 minutes.  If you are not already physically active start slow and gradually work up to 30 minutes of moderate physical activity.  Examples of moderate activity include walking briskly, mowing the  yard, dancing, swimming bicycling, etc. Eat a healthy diet- Eat a variety of healthy foods such as fruits, vegetables, low fat milk, low fat cheese, yogurt, lean meats, poultry, fish, beans, tofu, etc.  For more information on healthy eating, go to www.thenutritionsource.org Drink alcohol in moderation- Limit alcohol intake two drinks or less a day.  Never drink and drive. Dentist- Brush and floss teeth twice daily; visit your dentis twice a year. Depression-Your emotional health is as important as your physical health.  If you're feeling down, losing interest in things you normally enjoy please talk with your healthcare provider. Gun Safety- If you keep a gun in your home, keep it unloaded and with the safety lock on.  Bullets should be stored separately. Helmet use- Always wear a helmet when riding a motorcycle, bicycle, rollerblading or skateboarding. Safe sex- If you may be exposed to a sexually transmitted infection, use a condom Seat belts- Seat bels can save your life; always wear one. Smoke/Carbon Monoxide detectors- These detectors need to be installed on the appropriate level of your home.  Replace batteries at least once a year. Skin Cancer- When out in the sun, cover up and use sunscreen SPF 15 or higher. Violence- If anyone is threatening or hurting you, please tell your healthcare provider.

## 2020-10-30 NOTE — Progress Notes (Signed)
Pt presents to establish care and hospital follow-up, pt states that he is unsure of the insulin he should be taking

## 2020-11-03 ENCOUNTER — Other Ambulatory Visit: Payer: Self-pay

## 2020-11-03 ENCOUNTER — Other Ambulatory Visit: Payer: Self-pay | Admitting: Family

## 2020-11-03 DIAGNOSIS — F332 Major depressive disorder, recurrent severe without psychotic features: Secondary | ICD-10-CM

## 2020-11-03 DIAGNOSIS — I10 Essential (primary) hypertension: Secondary | ICD-10-CM

## 2020-11-03 DIAGNOSIS — E1021 Type 1 diabetes mellitus with diabetic nephropathy: Secondary | ICD-10-CM

## 2020-11-03 MED ORDER — INSULIN GLARGINE 100 UNIT/ML ~~LOC~~ SOLN
12.0000 [IU] | Freq: Every day | SUBCUTANEOUS | 0 refills | Status: DC
  Filled 2020-11-03: qty 10, 83d supply, fill #0
  Filled 2021-02-02: qty 10, 28d supply, fill #0

## 2020-11-03 MED ORDER — AMLODIPINE BESYLATE 10 MG PO TABS
10.0000 mg | ORAL_TABLET | Freq: Every day | ORAL | 0 refills | Status: DC
  Filled 2020-11-03: qty 90, 90d supply, fill #0
  Filled 2020-11-24: qty 30, 30d supply, fill #0

## 2020-11-03 MED ORDER — INSULIN GLARGINE 100 UNIT/ML ~~LOC~~ SOLN: 12.0000 [IU] | Freq: Every day | SUBCUTANEOUS | 0 refills | Status: DC

## 2020-11-03 MED ORDER — SERTRALINE HCL 25 MG PO TABS
75.0000 mg | ORAL_TABLET | Freq: Every day | ORAL | 2 refills | Status: DC
  Filled 2020-11-03 – 2021-03-16 (×5): qty 90, 30d supply, fill #0
  Filled 2021-03-16: qty 90, 30d supply, fill #1
  Filled 2021-04-15: qty 90, 30d supply, fill #0

## 2020-11-03 MED ORDER — "INSULIN SYRINGE-NEEDLE U-100 30G X 5/16"" 0.5 ML MISC"
1.0000 | 0 refills | Status: AC
  Filled 2020-11-03: qty 120, fill #0
  Filled 2021-04-15: qty 100, 50d supply, fill #0

## 2020-11-03 MED ORDER — GABAPENTIN 100 MG PO CAPS: 100.0000 mg | ORAL_CAPSULE | Freq: Three times a day (TID) | ORAL | 2 refills | Status: DC

## 2020-11-03 MED ORDER — INSULIN ASPART 100 UNIT/ML IJ SOLN: 3.0000 [IU] | Freq: Three times a day (TID) | INTRAMUSCULAR | 0 refills | Status: DC

## 2020-11-03 MED ORDER — AMLODIPINE BESYLATE 10 MG PO TABS: 10.0000 mg | ORAL_TABLET | Freq: Every day | ORAL | 0 refills | Status: DC

## 2020-11-03 MED ORDER — INSULIN ASPART 100 UNIT/ML IJ SOLN
3.0000 [IU] | Freq: Three times a day (TID) | INTRAMUSCULAR | 0 refills | Status: DC
  Filled 2020-11-03: qty 10, 111d supply, fill #0
  Filled 2021-02-02: qty 10, 28d supply, fill #0

## 2020-11-03 MED ORDER — HYDRALAZINE HCL 25 MG PO TABS: 25.0000 mg | ORAL_TABLET | Freq: Three times a day (TID) | ORAL | 2 refills | Status: DC

## 2020-11-03 MED ORDER — HYDRALAZINE HCL 25 MG PO TABS
25.0000 mg | ORAL_TABLET | Freq: Three times a day (TID) | ORAL | 2 refills | Status: DC
  Filled 2020-11-03 – 2021-05-03 (×5): qty 90, 30d supply, fill #0

## 2020-11-03 MED ORDER — SERTRALINE HCL 25 MG PO TABS
75.0000 mg | ORAL_TABLET | Freq: Every day | ORAL | 2 refills | Status: DC
Start: 2020-11-03 — End: 2020-11-03

## 2020-11-03 MED ORDER — GABAPENTIN 100 MG PO CAPS
100.0000 mg | ORAL_CAPSULE | Freq: Three times a day (TID) | ORAL | 2 refills | Status: DC
  Filled 2020-11-03 – 2020-12-27 (×2): qty 90, 30d supply, fill #0

## 2020-11-03 MED ORDER — "INSULIN SYRINGE-NEEDLE U-100 30G X 5/16"" 0.5 ML MISC": 1.0000 | 0 refills | Status: DC

## 2020-11-04 ENCOUNTER — Ambulatory Visit: Payer: Self-pay | Admitting: Internal Medicine

## 2020-11-10 ENCOUNTER — Other Ambulatory Visit: Payer: Self-pay

## 2020-11-24 ENCOUNTER — Other Ambulatory Visit: Payer: Self-pay

## 2020-11-24 ENCOUNTER — Inpatient Hospital Stay: Payer: No Typology Code available for payment source | Admitting: Family

## 2020-11-25 ENCOUNTER — Other Ambulatory Visit: Payer: Self-pay

## 2020-11-25 ENCOUNTER — Ambulatory Visit (INDEPENDENT_AMBULATORY_CARE_PROVIDER_SITE_OTHER): Payer: Self-pay | Admitting: Internal Medicine

## 2020-11-25 ENCOUNTER — Encounter: Payer: Self-pay | Admitting: Internal Medicine

## 2020-11-25 DIAGNOSIS — Z7185 Encounter for immunization safety counseling: Secondary | ICD-10-CM

## 2020-11-25 DIAGNOSIS — F32A Depression, unspecified: Secondary | ICD-10-CM

## 2020-11-25 DIAGNOSIS — A539 Syphilis, unspecified: Secondary | ICD-10-CM

## 2020-11-25 DIAGNOSIS — B20 Human immunodeficiency virus [HIV] disease: Secondary | ICD-10-CM

## 2020-11-25 NOTE — Assessment & Plan Note (Signed)
Declines Covid booster and flu vaccine today.

## 2020-11-25 NOTE — Assessment & Plan Note (Signed)
His HIV has been under good control on Genvoya and recent labs from 10/21/20 were undetectable with CD4 count 535 (35%).  Will continue with Genvoya and follow up in 6 months.

## 2020-11-25 NOTE — Patient Instructions (Signed)
Thank you for coming to see me today. It was a pleasure seeing you.  To Do: Labs, urine, and swabs today Continue Genvoya daily Continue follow up with new primary doctor Follow up with me in 6 months  If you have any questions or concerns, please do not hesitate to call the office at [phone].  Take Care,   Gwynn Burly

## 2020-11-25 NOTE — Assessment & Plan Note (Signed)
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  Will repeat RPR today and continue monitoring closely.  Will also check GC/CT today.

## 2020-11-25 NOTE — Assessment & Plan Note (Signed)
Admitted to behavioral health 7/29-8/11 for depression and SI.  He improved while there and followed up with new PCP on 10/30/20.  His mood is much better today than previously and he looks well.  Advised continued follow up with new PCP.

## 2020-11-25 NOTE — Progress Notes (Signed)
[name]   CHIEF COMPLAINT    HIV follow up.  Last seen for video visit on 05/04/20  SUBJECTIVE:    [name] is a 35 y.o. male with PMHx as below who presents to the clinic for HIV follow up.   Please see A&P for the details of today's visit and status of the patient's medical problems.   Patient's Medications  New Prescriptions   No medications on file  Previous Medications   AMLODIPINE (NORVASC) 10 MG TABLET    Take 1 tablet (10 mg total) by mouth daily. For high blood pressure   BLOOD GLUCOSE METER KIT AND SUPPLIES    Dispense based on patient and insurance preference. Use up to four times daily as directed: For blood sugar monitoring   BLOOD GLUCOSE MONITORING SUPPL (TRUE METRIX METER) W/DEVICE KIT    USE AS DIRECTED   ELVITEGRAVIR-COBICISTAT-EMTRICITABINE-TENOFOVIR (GENVOYA) 150-150-200-10 MG TABS TABLET    Take 1 tablet by mouth daily with breakfast.   GABAPENTIN (NEURONTIN) 100 MG CAPSULE    Take 1 capsule (100 mg total) by mouth 3 (three) times daily. For neuropathy   GLUCOSE BLOOD TEST STRIP    USE AS DIRECTED   GLUCOSE BLOOD TEST STRIP    use up to 4 times daily as directed for blood sugar monitoring   HYDRALAZINE (APRESOLINE) 25 MG TABLET    Take 1 tablet (25 mg total) by mouth every 8 (eight) hours. For hypertension   INSULIN ASPART (NOVOLOG) 100 UNIT/ML INJECTION    Inject 3 Units into the skin 3 (three) times daily with meals. For diabetes management   INSULIN GLARGINE (LANTUS) 100 UNIT/ML INJECTION    Inject 0.12 mLs (12 Units total) into the skin at bedtime. For diabetes management   INSULIN SYRINGE-NEEDLE U-100 30G X 5/16" 0.5 ML MISC    Inject 1 each into the skin See admin instructions.   SERTRALINE (ZOLOFT) 25 MG TABLET    Take 3 tablets (75 mg total) by mouth daily. For depression   TRUEPLUS LANCETS 28G MISC    USE AS DIRECTED   TRUEPLUS LANCETS 28G MISC    use up to 4 times daily as directed for blood sugar  monitoring  Modified Medications   No medications on file  Discontinued Medications   No medications on file      Past Medical History:  Diagnosis Date   Anxiety    Asthma    Depression    Headache    "only w/stress" (11/01/2017)   HIV (human immunodeficiency virus infection) ([place])    Migraine    "a few/month" (11/01/2017)   Refusal of blood transfusions as patient is Jehovah's Witness    Seizure ([place])    "only w/my low blood sugars"  (11/01/2017)   Type I diabetes mellitus ([place])     Social History   Tobacco Use   Smoking status: Every Day    Types: Cigars    Start date: 09/29/2015   Smokeless tobacco: Never   Tobacco comments:    11/01/2017  "2 cigarettes in ~ 6 month "  Vaping Use   Vaping Use: Never used  Substance Use Topics   Alcohol use: Yes    Alcohol/week: 7.0 standard drinks    Types: 7 Cans of beer per week   Drug use: Not Currently    Frequency: 2.0 times per week    Types: Marijuana    Comment: 11/01/2017  "weekly"  Family History  Problem Relation Age of Onset   Thyroid disease Mother    Diabetes Mother    Breast cancer Maternal Grandmother    Diabetes Maternal Grandmother     Allergies  Allergen Reactions   Shellfish Allergy Anaphylaxis and Hives   Aspirin Other (See Comments)    Unknown reaction    Review of Systems  Constitutional: Negative.   Respiratory: Negative.    Cardiovascular: Negative.   Gastrointestinal: Negative.   Genitourinary: Negative.   Psychiatric/Behavioral:  Positive for depression. Negative for suicidal ideas.     OBJECTIVE:    Vitals:   11/25/20 1059  BP: 124/82  Pulse: 96  Temp: 98.7 F (37.1 C)  TempSrc: Oral  SpO2: 96%  Weight: 192 lb (87.1 kg)     Body mass index is 25.64 kg/m.  Physical Exam Constitutional:      General: He is not in acute distress.    Appearance: Normal appearance.  HENT:     Head: Normocephalic and atraumatic.  Pulmonary:     Effort: Pulmonary effort is normal. No respiratory  distress.  Skin:    General: Skin is warm and dry.  Neurological:     General: No focal deficit present.     Mental Status: He is alert and oriented to person, place, and time.  Psychiatric:        Mood and Affect: Mood normal.        Behavior: Behavior normal.    Labs and Microbiology: CMP Latest Ref Rng & Units 10/21/2020 10/06/2020 10/05/2020  Glucose 65 - 139 mg/dL 285(H) 303(H) 62(L)  BUN 7 - 25 mg/dL 17 27(H) 25(H)  Creatinine 0.60 - 1.26 mg/dL 1.27(H) 1.34(H) 1.40(H)  Sodium 135 - 146 mmol/L 136 133(L) 139  Potassium 3.5 - 5.3 mmol/L 4.4 4.2 3.8  Chloride 98 - 110 mmol/L 104 99 102  CO2 20 - 32 mmol/L 25 28 -  Calcium 8.6 - 10.3 mg/dL 9.0 9.8 -  Total Protein 6.1 - 8.1 g/dL 6.5 - -  Total Bilirubin 0.2 - 1.2 mg/dL 0.4 - -  Alkaline Phos 38 - 126 U/L - - -  AST 10 - 40 U/L 14 - -  ALT 9 - 46 U/L 15 - -   CBC Latest Ref Rng & Units 10/21/2020 10/05/2020 09/24/2020  WBC 3.8 - 10.8 Thousand/uL 6.1 - 8.3  Hemoglobin 13.2 - 17.1 g/dL 13.8 17.0 16.1  Hematocrit 38.5 - 50.0 % 40.4 50.0 46.9  Platelets 140 - 400 Thousand/uL 305 - 321     Lab Results  Component Value Date   HIV1RNAQUANT Not Detected 10/21/2020   HIV1RNAQUANT <20 03/10/2020   HIV1RNAQUANT <20 11/22/2019   CD4TABS 535 10/21/2020   CD4TABS 582 03/10/2020   CD4TABS 641 11/22/2019    RPR and STI: Lab Results  Component Value Date   LABRPR REACTIVE (A) 10/21/2020   LABRPR REACTIVE (A) 03/10/2020   LABRPR REACTIVE (A) 11/22/2019   LABRPR REACTIVE (A) 01/01/2019   LABRPR NON-REACTIVE 04/17/2018   RPRTITER 1:32 (H) 10/21/2020   RPRTITER 1:16 (H) 03/10/2020   RPRTITER 1:8 (H) 11/22/2019   RPRTITER 1:32 (H) 01/01/2019   RPRTITER 1:16 (A) 08/17/2015      Hepatitis B: Lab Results  Component Value Date   HEPBSAB POS (A) 10/21/2014   HEPBSAG NEGATIVE 10/21/2014   HEPBCAB NON REACTIVE 10/21/2014   Hepatitis C: No results found for: HEPCAB, HCVRNAPCRQN Hepatitis A: Lab Results  Component Value Date   HAV  NON-REACTIVE 12/06/2019  Lipids: Lab Results  Component Value Date   CHOL 154 10/21/2020   TRIG 102 10/21/2020   HDL 71 10/21/2020   CHOLHDL 2.2 10/21/2020   VLDL 10 09/24/2020   LDLCALC 64 10/21/2020      ASSESSMENT & PLAN:    HIV infection ([name]) His HIV has been under good control on Genvoya and recent labs from 10/21/20 were undetectable with CD4 count 535 (35%).  Will continue with Genvoya and follow up in 6 months.  Syphilis Labs from last month with RPR titer increase to 1:32 and treated with Bicillin x 1 dose.  Will repeat RPR today and continue monitoring closely.  Will also check GC/CT today.  Vaccine counseling Declines Covid booster and flu vaccine today.   Depression Admitted to behavioral health 7/29-8/11 for depression and SI.  He improved while there and followed up with new PCP on 10/30/20.  His mood is much better today than previously and he looks well.  Advised continued follow up with new PCP.    Orders Placed This Encounter  Procedures   [facility] 11/25/2020, 11:16 AM

## 2020-11-26 LAB — CYTOLOGY, (ORAL, ANAL, URETHRAL) ANCILLARY ONLY
Chlamydia: NEGATIVE
Chlamydia: NEGATIVE
Comment: NEGATIVE
Comment: NEGATIVE
Comment: NORMAL
Comment: NORMAL
Neisseria Gonorrhea: NEGATIVE
Neisseria Gonorrhea: NEGATIVE

## 2020-11-26 LAB — URINE CYTOLOGY ANCILLARY ONLY
Chlamydia: NEGATIVE
Comment: NEGATIVE
Comment: NORMAL
Neisseria Gonorrhea: NEGATIVE

## 2020-11-27 ENCOUNTER — Telehealth: Payer: Self-pay | Admitting: *Deleted

## 2020-11-27 LAB — RPR TITER: RPR Titer: 1:32 {titer} — ABNORMAL HIGH

## 2020-11-27 LAB — FLUORESCENT TREPONEMAL AB(FTA)-IGG-BLD: Fluorescent Treponemal ABS: REACTIVE — AB

## 2020-11-27 LAB — RPR: RPR Ser Ql: REACTIVE — AB

## 2020-11-27 NOTE — Telephone Encounter (Signed)
Called patient, left message letting him know that his lab results were negative/stable, and that he is scheduled for 10/14 at 9:00 to follow up on how his correct dosing of zoloft 75 mg daily is going (he was previously taking zoloft 25 mg three times daily, found no improvement). Andree Coss, RN

## 2020-11-27 NOTE — Telephone Encounter (Signed)
-----   Message from Kathlynn Grate, DO sent at 11/27/2020 10:36 AM EDT ----- Please let patient know that his GC/CT testing was all negative. RPR titer is unchanged but this is not necessarily unexpected since he was just treated for syphilis within 30 days.  Will continue monitoring this to ensure comes down appropriately.   Thanks

## 2020-11-28 NOTE — Progress Notes (Signed)
Erroneous encounter

## 2020-12-01 ENCOUNTER — Other Ambulatory Visit: Payer: Self-pay

## 2020-12-02 ENCOUNTER — Encounter: Payer: No Typology Code available for payment source | Admitting: Family

## 2020-12-02 DIAGNOSIS — Z13220 Encounter for screening for lipoid disorders: Secondary | ICD-10-CM

## 2020-12-02 DIAGNOSIS — Z0000 Encounter for general adult medical examination without abnormal findings: Secondary | ICD-10-CM

## 2020-12-02 DIAGNOSIS — Z1329 Encounter for screening for other suspected endocrine disorder: Secondary | ICD-10-CM

## 2020-12-02 DIAGNOSIS — Z13228 Encounter for screening for other metabolic disorders: Secondary | ICD-10-CM

## 2020-12-02 DIAGNOSIS — E1021 Type 1 diabetes mellitus with diabetic nephropathy: Secondary | ICD-10-CM

## 2020-12-02 DIAGNOSIS — Z130 Encounter for screening for diseases of the blood and blood-forming organs and certain disorders involving the immune mechanism: Secondary | ICD-10-CM

## 2020-12-11 ENCOUNTER — Ambulatory Visit: Payer: Self-pay | Admitting: Internal Medicine

## 2020-12-17 ENCOUNTER — Other Ambulatory Visit: Payer: Self-pay

## 2020-12-28 ENCOUNTER — Other Ambulatory Visit: Payer: Self-pay

## 2020-12-28 ENCOUNTER — Ambulatory Visit: Payer: Self-pay | Admitting: Internal Medicine

## 2021-01-04 ENCOUNTER — Other Ambulatory Visit: Payer: Self-pay

## 2021-01-29 ENCOUNTER — Other Ambulatory Visit: Payer: Self-pay

## 2021-01-29 MED ORDER — SODIUM FLUORIDE 1.1 % DT CREA
TOPICAL_CREAM | DENTAL | 3 refills | Status: DC
  Filled 2021-01-29: qty 51, 30d supply, fill #0

## 2021-02-02 ENCOUNTER — Other Ambulatory Visit: Payer: Self-pay

## 2021-02-09 ENCOUNTER — Other Ambulatory Visit: Payer: Self-pay

## 2021-03-16 ENCOUNTER — Ambulatory Visit: Payer: Self-pay

## 2021-03-16 ENCOUNTER — Other Ambulatory Visit: Payer: Self-pay

## 2021-03-16 ENCOUNTER — Other Ambulatory Visit: Payer: Self-pay | Admitting: Family

## 2021-03-16 DIAGNOSIS — E1021 Type 1 diabetes mellitus with diabetic nephropathy: Secondary | ICD-10-CM

## 2021-03-16 MED ORDER — INSULIN ASPART 100 UNIT/ML IJ SOLN
3.0000 [IU] | Freq: Three times a day (TID) | INTRAMUSCULAR | 0 refills | Status: DC
  Filled 2021-03-16: qty 10, 111d supply, fill #0
  Filled 2021-03-16 – 2021-04-16 (×2): qty 10, 28d supply, fill #0

## 2021-03-16 MED ORDER — INSULIN GLARGINE 100 UNIT/ML ~~LOC~~ SOLN
12.0000 [IU] | Freq: Every day | SUBCUTANEOUS | 0 refills | Status: DC
  Filled 2021-03-16: qty 10, 83d supply, fill #0
  Filled 2021-03-16: qty 10, 28d supply, fill #0
  Filled 2021-04-15: qty 10, 83d supply, fill #0

## 2021-03-18 ENCOUNTER — Ambulatory Visit: Payer: Self-pay

## 2021-03-22 ENCOUNTER — Other Ambulatory Visit: Payer: Self-pay

## 2021-03-23 ENCOUNTER — Other Ambulatory Visit: Payer: Self-pay

## 2021-03-23 NOTE — Progress Notes (Unsigned)
Therapist met with client as scheduled and continued rapport building. Therapist introduced and shared some information about herself and encouraged the same from client in order to build trust and openness within the counseling relationship. Therapist discussed / developed / reviewed treatment plan with client based on the result from client's assessment. Therapist discussed their current issues /diagnosis of DSM-5/as well as recommendations and target population eligibility services. Therapist assessed for SI/HI during session  Client met with therapist as scheduled. Client presented alert mood was depressed; affect appeared to be congruent with client report of mood. Client was not receptive to rapport building and goals implemented by therapist. Client shared some information about herself, such as: expressing likes, dislikes, and strengths. Client was not able to make connections between consequences, challenges based on his statements. Client requested an emergency session with clinician due to feeling depressed. He has already built a repoire with the previous therapist and felt that he could not make that same connection per his statements of you don't understand, the last therapist understood because she knew me and I don't want to be here and I'm being forced to be here. Client never elaborated on this. Client also expressed that he feels victimized, never fit in and was always depressed for as long as he can remember. Client stated at some point in the session before he hung up, stated, I just want it all to end. When clinician attempted to evaluate client SI client hung up the phone at which time clinician called back repeatedly with no call or response. Clinician called the non-emergency line and requested a wellness check and an update on the findings (call back). The police called back and informed clinician that by the time he showed up, his roommate stated that he had left. His roommate  also described client's behavior as isolative and depressed, stating that client was just not good. Clinician called client's mother and mother informed clinician that client was ok. Clinician provided client with his previous therapist contact information. Client blocked any attempts to be reciprocal and Client denied SI/HI.

## 2021-04-15 ENCOUNTER — Other Ambulatory Visit: Payer: Self-pay | Admitting: Family

## 2021-04-15 ENCOUNTER — Other Ambulatory Visit: Payer: Self-pay

## 2021-04-15 DIAGNOSIS — E1021 Type 1 diabetes mellitus with diabetic nephropathy: Secondary | ICD-10-CM

## 2021-04-16 ENCOUNTER — Other Ambulatory Visit: Payer: Self-pay

## 2021-04-20 ENCOUNTER — Ambulatory Visit: Payer: Self-pay

## 2021-05-03 ENCOUNTER — Ambulatory Visit: Payer: Self-pay

## 2021-05-03 ENCOUNTER — Ambulatory Visit: Payer: Self-pay | Admitting: Internal Medicine

## 2021-05-03 NOTE — Progress Notes (Unsigned)
°  ° ° ° ° °[facility] ° ° °CHIEF COMPLAINT   ° °HIV follow up.   ° °SUBJECTIVE:   ° °[name] is a 36 y.o. male with PMHx as below who presents to the clinic for HIV follow up.  ° °Please see A&P for the details of today's visit and status of the patient's medical problems.  ° °Patient's Medications  °New Prescriptions  ° No medications on file  °Previous Medications  ° AMLODIPINE (NORVASC) 10 MG TABLET    Take 1 tablet (10 mg total) by mouth daily. For high blood pressure  ° BLOOD GLUCOSE METER KIT AND SUPPLIES    Dispense based on patient and insurance preference. Use up to four times daily as directed: For blood sugar monitoring  ° BLOOD GLUCOSE MONITORING SUPPL (TRUE METRIX METER) W/DEVICE KIT    USE AS DIRECTED  ° ELVITEGRAVIR-COBICISTAT-EMTRICITABINE-TENOFOVIR (GENVOYA) 150-150-200-10 MG TABS TABLET    Take 1 tablet by mouth daily with breakfast.  ° GABAPENTIN (NEURONTIN) 100 MG CAPSULE    Take 1 capsule (100 mg total) by mouth 3 (three) times daily. For neuropathy  ° GLUCOSE BLOOD TEST STRIP    USE AS DIRECTED  ° GLUCOSE BLOOD TEST STRIP    use up to 4 times daily as directed for blood sugar monitoring  ° HYDRALAZINE (APRESOLINE) 25 MG TABLET    Take 1 tablet (25 mg total) by mouth every 8 (eight) hours. For hypertension  ° INSULIN ASPART (NOVOLOG) 100 UNIT/ML INJECTION    Inject 3 Units into the skin 3 (three) times daily with meals. For diabetes management  ° INSULIN GLARGINE (LANTUS) 100 UNIT/ML INJECTION    Inject 0.12 mLs (12 Units total) into the skin at bedtime. For diabetes management  ° INSULIN SYRINGE-NEEDLE U-100 30G X 5/16" 0.5 ML MISC    Inject 1 syringe into the skin as directed  ° SERTRALINE (ZOLOFT) 25 MG TABLET    Take 3 tablets (75 mg total) by mouth daily. For depression  ° SODIUM FLUORIDE (PREVIDENT 5000 PLUS) 1.1 % CREA DENTAL CREAM    Brush on teeth with a toothbrush after evening mouth care. Spit out excess and do not rinse  ° TRUEPLUS LANCETS  28G MISC    USE AS DIRECTED  ° TRUEPLUS LANCETS 28G MISC    use up to 4 times daily as directed for blood sugar monitoring  °Modified Medications  ° No medications on file  °Discontinued Medications  ° No medications on file  °   ° °Past Medical History:  °Diagnosis Date  ° Anxiety   ° Asthma   ° Depression   ° Headache   ° "only w/stress" (11/01/2017)  ° HIV (human immunodeficiency virus infection) ([facility])   ° Migraine   ° "a few/month" (11/01/2017)  ° Refusal of blood transfusions as patient is Jehovah's Witness   ° Seizure ([facility])   ° "only w/my low blood sugars"  (11/01/2017)  ° Type I diabetes mellitus ([facility])   ° ° °Social History  ° °Tobacco Use  ° Smoking status: Every Day  °  Types: Cigars  °  Start date: 09/29/2015  ° Smokeless tobacco: Never  ° Tobacco comments:  °  11/01/2017  "2 cigarettes in ~ 6 month "  °Vaping Use  ° Vaping Use: Never used  °Substance Use Topics  ° Alcohol use: Yes  °  Alcohol/week: 7.0 standard drinks  °  Types: 7 Cans of beer per week  ° Drug   Drug use: Not Currently    Frequency: 2.0 times per week    Types: Marijuana    Comment: 11/01/2017  "weekly"    Family History  Problem Relation Age of Onset   Thyroid disease Mother    Diabetes Mother    Breast cancer Maternal Grandmother    Diabetes Maternal Grandmother     Allergies  Allergen Reactions   Shellfish Allergy Anaphylaxis and Hives   Aspirin Other (See Comments)    Unknown reaction    ROS   OBJECTIVE:    There were no vitals filed for this visit.   There is no height or weight on file to calculate BMI.  Physical Exam  Labs and Microbiology: CMP Latest Ref Rng & Units 10/21/2020 10/06/2020 10/05/2020  Glucose 65 - 139 mg/dL 285(H) 303(H) 62(L)  BUN 7 - 25 mg/dL 17 27(H) 25(H)  Creatinine 0.60 - 1.26 mg/dL 1.27(H) 1.34(H) 1.40(H)  Sodium 135 - 146 mmol/L 136 133(L) 139  Potassium 3.5 - 5.3 mmol/L 4.4 4.2 3.8  Chloride 98 - 110 mmol/L 104 99 102  CO2 20 - 32 mmol/L 25 28 -  Calcium 8.6 - 10.3 mg/dL 9.0 9.8 -  Total  Protein 6.1 - 8.1 g/dL 6.5 - -  Total Bilirubin 0.2 - 1.2 mg/dL 0.4 - -  Alkaline Phos 38 - 126 U/L - - -  AST 10 - 40 U/L 14 - -  ALT 9 - 46 U/L 15 - -   CBC Latest Ref Rng & Units 10/21/2020 10/05/2020 09/24/2020  WBC 3.8 - 10.8 Thousand/uL 6.1 - 8.3  Hemoglobin 13.2 - 17.1 g/dL 13.8 17.0 16.1  Hematocrit 38.5 - 50.0 % 40.4 50.0 46.9  Platelets 140 - 400 Thousand/uL 305 - 321     Lab Results  Component Value Date   HIV1RNAQUANT Not Detected 10/21/2020   HIV1RNAQUANT <20 03/10/2020   HIV1RNAQUANT <20 11/22/2019   CD4TABS 535 10/21/2020   CD4TABS 582 03/10/2020   CD4TABS 641 11/22/2019    RPR and STI: Lab Results  Component Value Date   LABRPR REACTIVE (A) 11/25/2020   LABRPR REACTIVE (A) 10/21/2020   LABRPR REACTIVE (A) 03/10/2020   LABRPR REACTIVE (A) 11/22/2019   LABRPR REACTIVE (A) 01/01/2019   RPRTITER 1:32 (H) 11/25/2020   RPRTITER 1:32 (H) 10/21/2020   RPRTITER 1:16 (H) 03/10/2020   RPRTITER 1:8 (H) 11/22/2019   RPRTITER 1:32 (H) 01/01/2019    STI Results GC CT  11/25/2020 Negative Negative  11/25/2020 Negative Negative  11/25/2020 Negative Negative  04/17/2018 Negative Negative  04/17/2018 Negative Negative  04/17/2018 Negative Negative  01/07/2016 Negative Negative  09/24/2015 Negative Negative  09/24/2015 *Negative *Negative  09/24/2015 ***POSITIVE**(A) *Negative  10/22/2014 Negative Negative    Hepatitis B: Lab Results  Component Value Date   HEPBSAB POS (A) 10/21/2014   HEPBSAG NEGATIVE 10/21/2014   HEPBCAB NON REACTIVE 10/21/2014   Hepatitis C: No results found for: HEPCAB, HCVRNAPCRQN Hepatitis A: Lab Results  Component Value Date   HAV NON-REACTIVE 12/06/2019   Lipids: Lab Results  Component Value Date   CHOL 154 10/21/2020   TRIG 102 10/21/2020   HDL 71 10/21/2020   CHOLHDL 2.2 10/21/2020   VLDL 10 09/24/2020   LDLCALC 64 10/21/2020    Imaging: ***   ASSESSMENT & PLAN:    No problem-specific Assessment & Plan notes found for this  encounter.   No orders of the defined types were placed in this encounter.      *** Vaccines Influenza: give every  COVID: recommend vaccination if not already done °Pneumovax-23: (if CD4 >200) give twice every 5 years apart before age 65, then once at age 65.  Give >8 weeks from Prevnar-13 °Prevnar-13: (preferably when CD4 >200) give once, give >1 year from last Pneumovax-23 °Hepatitis A: give Havrix 2 dose series at 0 and 6-12 months if non-immune °Hepatitis B: give Heplisav 2 dose series at 0 and 4 weeks if non-immune.  Repeat serology 2 months after vaccine and revaccinate if needed °MenACWY: 2 dose primary series 8 weeks apart, then 1 dose booster every 5 years °HPV: Gardasil-9 at 0, 2, and 6 months for ages 9-26 should be vaccinated.  Ages 27-45 should be offered if appropriate °Tdap: give every 10 years °Shingles: give Shingrix 2 dose series at 0 and 2-6 months if >50 years on ART with CD4 cell count >200 °Varicella: primary vaccination may be considered in VZV seronegative persons aged >8 years (if CD4 >200)  °MMR: vaccine should be given if born in 1956 or after and do not have immunity (if CD4 >200) ° °Screening °DEXA Scan: if age >50 °Quantiferon: check at initiation of care °Hepatitis C: check at initiation of care.  Screen annually if risk factors °HLA B5701: check at initiation of care °G6PD: check if starting therapy with oxidant drugs °Lipids: check annually °Urinalysis: check annually or every 6 months if on tenofovir °Hgb A1c: check annually ° °ASCVD Risk Score °Consider high-intensity statin therapy if 10-year ASCVD risk score >7.5% °The ASCVD Risk score (Arnett DK, et al., 2019) failed to calculate for the following reasons: °  The 2019 ASCVD risk score is only valid for ages 40 to 79 ° ° ° N  °[facility] °[facility] °05/03/2021, 5:46 AM ° °HIV: °Patients HIV has been under excellent long term control with Genvoya.  Labs in  August 2022 were viral load undetectable and CD4 count 535.  Will send refills today, check labs, and continue with Genvoya. ° °Syphilis: °Repeat RPR today after labs in August showed RPR titer increased to 1:32 and treated with Bicillin x 1 dose. ° ° °STI/Screening: °Will screen today. ° °Depression: °Continues to be an ongoing issue and patient has previously met with counselor in January 2023. Encouraged patient to continue to follow up with counselor and psychiatry.  No SI/HI today.   ° °

## 2021-05-04 ENCOUNTER — Other Ambulatory Visit: Payer: Self-pay

## 2021-05-11 ENCOUNTER — Other Ambulatory Visit: Payer: Self-pay

## 2021-05-15 ENCOUNTER — Other Ambulatory Visit: Payer: Self-pay | Admitting: Internal Medicine

## 2021-05-15 DIAGNOSIS — Z21 Asymptomatic human immunodeficiency virus [HIV] infection status: Secondary | ICD-10-CM

## 2021-05-17 ENCOUNTER — Ambulatory Visit: Payer: No Typology Code available for payment source | Admitting: Internal Medicine

## 2021-06-09 ENCOUNTER — Other Ambulatory Visit: Payer: Self-pay | Admitting: Family

## 2021-06-09 DIAGNOSIS — E1021 Type 1 diabetes mellitus with diabetic nephropathy: Secondary | ICD-10-CM

## 2021-06-10 NOTE — Telephone Encounter (Signed)
Requested medication (s) are due for refill today:   Yes ? ?Requested medication (s) are on the active medication list:   Yes ? ?Future visit scheduled:   No ? ? ?Last ordered: 03/16/2021 10 ml, 0 refills ? ?Returned because has not had a valid encounter within 6 months per protocol.   Called to make an appt however there is not a voicemail set up so unable to leave a message.     ? ?Requested Prescriptions  ?Pending Prescriptions Disp Refills  ? insulin aspart (NOVOLOG) 100 UNIT/ML injection 10 mL 0  ?  Sig: Inject 3 Units into the skin 3 (three) times daily with meals. For diabetes management  ?  ? Endocrinology:  Diabetes - Insulins Failed - 06/09/2021  1:49 PM  ?  ?  Failed - HBA1C is between 0 and 7.9 and within 180 days  ?  Hgb A1c MFr Bld  ?Date Value Ref Range Status  ?09/09/2020 9.7 (H) 4.8 - 5.6 % Final  ?  Comment:  ?  (NOTE) ?Pre diabetes:          5.7%-6.4% ? ?Diabetes:              >6.4% ? ?Glycemic control for   <7.0% ?adults with diabetes ?  ?  ?  ?  ?  Failed - Valid encounter within last 6 months  ?  Recent Outpatient Visits   ? ?      ? 7 months ago Encounter to establish care  ? Primary Care at [facility], Amy J, NP  ? 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 [facility], Vernia Buff, NP  ? ?  ?  ? ?  ?  ?  ? ?

## 2021-06-10 NOTE — Telephone Encounter (Signed)
Requested medication (s) are due for refill today:   Yes ? ?Requested medication (s) are on the active medication list:   Yes ? ?Future visit scheduled:   No    ? ? ?Last ordered: 03/16/2021 10 ml, 0 refills ? ?Returned because attempted to call and make an appt however there is not a voicemail set up so unable to leave a message.   He  needs an appt because he has not had a valid encounter within 6 months per protocol.  ? ?Requested Prescriptions  ?Pending Prescriptions Disp Refills  ? insulin aspart (NOVOLOG) 100 UNIT/ML injection 10 mL 0  ?  Sig: Inject 3 Units into the skin 3 (three) times daily with meals. For diabetes management  ?  ? Endocrinology:  Diabetes - Insulins Failed - 06/09/2021  1:49 PM  ?  ?  Failed - HBA1C is between 0 and 7.9 and within 180 days  ?  Hgb A1c MFr Bld  ?Date Value Ref Range Status  ?09/09/2020 9.7 (H) 4.8 - 5.6 % Final  ?  Comment:  ?  (NOTE) ?Pre diabetes:          5.7%-6.4% ? ?Diabetes:              >6.4% ? ?Glycemic control for   <7.0% ?adults with diabetes ?  ?  ?  ?  ?  Failed - Valid encounter within last 6 months  ?  Recent Outpatient Visits   ? ?      ? 7 months ago Encounter to establish care  ? Primary Care at [facility], Amy J, NP  ? 2 years ago Type 1 diabetes mellitus with diabetic polyneuropathy ([facility])  ? [facility] [place], Shea Stakes, NP  ? ?  ?  ? ?  ?  ?  ? ?

## 2021-06-10 NOTE — Telephone Encounter (Signed)
Requested medication (s) are due for refill today:   Yes ? ?Requested medication (s) are on the active medication list:   Yes ? ?Future visit scheduled:   No ? ? ?Last ordered: 03/16/2021 10 ml, 0 refill ? ?Returned because needs an appt.  No valid encounter within 6 months.   Attempted  to call to make an appt however there isn't a voicemail set up so unable to leave a message.    (Sorry if this came through twice I think it went to the wrong box the first time).  ? ?Requested Prescriptions  ?Pending Prescriptions Disp Refills  ? insulin aspart (NOVOLOG) 100 UNIT/ML injection 10 mL 0  ?  Sig: Inject 3 Units into the skin 3 (three) times daily with meals. For diabetes management  ?  ? Endocrinology:  Diabetes - Insulins Failed - 06/09/2021  1:49 PM  ?  ?  Failed - HBA1C is between 0 and 7.9 and within 180 days  ?  Hgb A1c MFr Bld  ?Date Value Ref Range Status  ?09/09/2020 9.7 (H) 4.8 - 5.6 % Final  ?  Comment:  ?  (NOTE) ?Pre diabetes:          5.7%-6.4% ? ?Diabetes:              >6.4% ? ?Glycemic control for   <7.0% ?adults with diabetes ?  ?  ?  ?  ?  Failed - Valid encounter within last 6 months  ?  Recent Outpatient Visits   ? ?      ? 7 months ago Encounter to establish care  ? [facility] Care at [facility], Amy J, NP  ? 2 years ago Type 1 diabetes mellitus with diabetic polyneuropathy ([facility])  ? [facility] [place], Shea Stakes, NP  ? ?  ?  ? ?  ?  ?  ? ?

## 2021-06-15 ENCOUNTER — Other Ambulatory Visit: Payer: Self-pay

## 2021-06-22 ENCOUNTER — Other Ambulatory Visit: Payer: Self-pay | Admitting: Internal Medicine

## 2021-06-22 ENCOUNTER — Ambulatory Visit: Payer: Self-pay

## 2021-06-22 ENCOUNTER — Telehealth: Payer: Self-pay

## 2021-06-22 DIAGNOSIS — Z21 Asymptomatic human immunodeficiency virus [HIV] infection status: Secondary | ICD-10-CM

## 2021-06-22 NOTE — Telephone Encounter (Signed)
Pending- pharmacy appt 4/26 ?

## 2021-06-22 NOTE — Telephone Encounter (Signed)
Received refill request from pharmacy. Patient states he has not renewed his RW/ADAP and would like to schedule a visit. Patient also has multiple no shows and accepts appointment with pharmacy clinic as well.  ?Refills pending pharmacy appt. Patent will need to follow up with Dr. Earlene Plater in the future after pharmacy visit.  ? ?

## 2021-06-23 ENCOUNTER — Other Ambulatory Visit: Payer: Self-pay

## 2021-06-23 ENCOUNTER — Encounter: Payer: Self-pay | Admitting: Internal Medicine

## 2021-06-23 ENCOUNTER — Ambulatory Visit (INDEPENDENT_AMBULATORY_CARE_PROVIDER_SITE_OTHER): Payer: No Typology Code available for payment source | Admitting: Pharmacist

## 2021-06-23 DIAGNOSIS — B20 Human immunodeficiency virus [HIV] disease: Secondary | ICD-10-CM

## 2021-06-23 DIAGNOSIS — Z113 Encounter for screening for infections with a predominantly sexual mode of transmission: Secondary | ICD-10-CM

## 2021-06-23 DIAGNOSIS — F332 Major depressive disorder, recurrent severe without psychotic features: Secondary | ICD-10-CM

## 2021-06-23 DIAGNOSIS — A539 Syphilis, unspecified: Secondary | ICD-10-CM

## 2021-06-23 DIAGNOSIS — Z21 Asymptomatic human immunodeficiency virus [HIV] infection status: Secondary | ICD-10-CM

## 2021-06-23 MED ORDER — SERTRALINE HCL 25 MG PO TABS: 75.0000 mg | ORAL_TABLET | Freq: Every day | ORAL | 0 refills | Status: DC

## 2021-06-23 MED ORDER — GENVOYA 150-150-200-10 MG PO TABS: 1.0000 | ORAL_TABLET | Freq: Every day | ORAL | 0 refills | Status: DC

## 2021-06-23 NOTE — Patient Instructions (Addendum)
[email or web address] ? ?[email or web address] ? ?[facility]: ?[id number] ?

## 2021-06-23 NOTE — Progress Notes (Signed)
? ?06/23/2021 ? ?HPI: [name] is a 36 y.o. male who presents to the [facility] for HIV follow-up. ? ?Patient Active Problem List  ? Diagnosis Date Noted  ? Essential hypertension 09/28/2020  ? MDD (major depressive disorder), recurrent severe, without psychosis ([place]) 09/26/2020  ? Major depressive disorder, single episode, severe ([place])   ? Alcohol intoxication with moderate or severe use disorder ([place])   ? Cannabis use disorder, moderate, dependence ([place])   ? Hypoglycemia 09/09/2020  ? Aggressive behavior   ? Altered mental status 02/09/2020  ? Suicidal ideation 02/09/2020  ? MDD (major depressive disorder), recurrent episode, severe ([place]) 01/15/2020  ? SIRS (systemic inflammatory response syndrome) ([facility])   ? Healthcare maintenance 12/05/2019  ? Hypoglycemia due to insulin 03/31/2019  ? Syphilis 01/15/2019  ? Closed fracture of base of fifth metatarsal bone of right foot 11/13/2017  ? Closed displaced comminuted fracture of shaft of right tibia 11/01/2017  ? Closed fracture of medial malleolus of right ankle 10/31/2017  ? Vaccine counseling 12/14/2016  ? Screening examination for venereal disease 06/09/2015  ? Encounter for long-term (current) use of medications 06/09/2015  ? Depression 03/04/2015  ? Dizziness and giddiness 03/04/2015  ? Tobacco dependence 11/12/2014  ? HIV infection ([place]) 11/11/2014  ? Stress 12/19/2012  ? Asthma 10/02/2010  ? Diabetes mellitus type 1 ([place]) 03/01/1991  ? ? ?Patient's Medications  ?New Prescriptions  ? No medications on file  ?Previous Medications  ? AMLODIPINE (NORVASC) 10 MG TABLET    Take 1 tablet (10 mg total) by mouth daily. For high blood pressure  ? BLOOD GLUCOSE METER KIT AND SUPPLIES    Dispense based on patient and insurance preference. Use up to four times daily as directed: For blood sugar monitoring  ? BLOOD GLUCOSE MONITORING SUPPL (TRUE METRIX METER) W/DEVICE KIT    USE AS DIRECTED  ? GABAPENTIN (NEURONTIN) 100 MG CAPSULE    Take 1 capsule  (100 mg total) by mouth 3 (three) times daily. For neuropathy  ? GENVOYA 150-150-200-10 MG TABS TABLET    TAKE 1 TABLET BY MOUTH DAILY WITH BREAKFAST  ? GLUCOSE BLOOD TEST STRIP    USE AS DIRECTED  ? GLUCOSE BLOOD TEST STRIP    use up to 4 times daily as directed for blood sugar monitoring  ? HYDRALAZINE (APRESOLINE) 25 MG TABLET    Take 1 tablet (25 mg total) by mouth every 8 (eight) hours. For hypertension  ? INSULIN ASPART (NOVOLOG) 100 UNIT/ML INJECTION    Inject 3 Units into the skin 3 (three) times daily with meals. For diabetes management  ? INSULIN GLARGINE (LANTUS) 100 UNIT/ML INJECTION    Inject 0.12 mLs (12 Units total) into the skin at bedtime. For diabetes management  ? INSULIN SYRINGE-NEEDLE U-100 30G X 5/16" 0.5 ML MISC    Inject 1 syringe into the skin as directed  ? SERTRALINE (ZOLOFT) 25 MG TABLET    Take 3 tablets (75 mg total) by mouth daily. For depression  ? SODIUM FLUORIDE (PREVIDENT 5000 PLUS) 1.1 % CREA DENTAL CREAM    Brush on teeth with a toothbrush after evening mouth care. Spit out excess and do not rinse  ? TRUEPLUS LANCETS 28G MISC    USE AS DIRECTED  ? TRUEPLUS LANCETS 28G MISC    use up to 4 times daily as directed for blood sugar monitoring  ?Modified Medications  ? No medications on file  ?Discontinued Medications  ? No medications on file  ? ? ?  Allergies: ?Allergies  ?Allergen Reactions  ? Shellfish Allergy Anaphylaxis and Hives  ? Aspirin Other (See Comments)  ?  Unknown reaction  ? ? ?Past Medical History: ?Past Medical History:  ?Diagnosis Date  ? Anxiety   ? Asthma   ? Depression   ? Headache   ? "only w/stress" (11/01/2017)  ? HIV (human immunodeficiency virus infection) ([place])   ? Migraine   ? "a few/month" (11/01/2017)  ? Refusal of blood transfusions as patient is Jehovah's Witness   ? Seizure ([place])   ? "only w/my low blood sugars"  (11/01/2017)  ? Type I diabetes mellitus ([place])   ? ? ?Social History: ?Social History  ? ?Socioeconomic History  ? Marital status: Single  ?  Spouse  name: Not on file  ? Number of children: Not on file  ? Years of education: Not on file  ? Highest education level: Not on file  ?Occupational History  ? Not on file  ?Tobacco Use  ? Smoking status: Every Day  ?  Types: Cigars  ?  Start date: 09/29/2015  ? Smokeless tobacco: Never  ? Tobacco comments:  ?  11/01/2017  "2 cigarettes in ~ 6 month "  ?Vaping Use  ? Vaping Use: Never used  ?Substance and Sexual Activity  ? Alcohol use: Yes  ?  Alcohol/week: 7.0 standard drinks  ?  Types: 7 Cans of beer per week  ? Drug use: Not Currently  ?  Frequency: 2.0 times per week  ?  Types: Marijuana  ?  Comment: 11/01/2017  "weekly"  ? Sexual activity: Yes  ?  Partners: Male  ?  Comment: pt. declined condoms  ?Other Topics Concern  ? Not on file  ?Social History Narrative  ? Not on file  ? ?Social Determinants of Health  ? ?Financial Resource Strain: Not on file  ?Food Insecurity: Not on file  ?Transportation Needs: Not on file  ?Physical Activity: Not on file  ?Stress: Not on file  ?Social Connections: Not on file  ? ? ?Labs: ?Lab Results  ?Component Value Date  ? HIV1RNAQUANT Not Detected 10/21/2020  ? HIV1RNAQUANT <20 03/10/2020  ? HIV1RNAQUANT <20 11/22/2019  ? CD4TABS 535 10/21/2020  ? CD4TABS 582 03/10/2020  ? CD4TABS 641 11/22/2019  ? ? ?RPR and STI ?Lab Results  ?Component Value Date  ? LABRPR REACTIVE (A) 11/25/2020  ? LABRPR REACTIVE (A) 10/21/2020  ? LABRPR REACTIVE (A) 03/10/2020  ? LABRPR REACTIVE (A) 11/22/2019  ? LABRPR REACTIVE (A) 01/01/2019  ? RPRTITER 1:32 (H) 11/25/2020  ? RPRTITER 1:32 (H) 10/21/2020  ? RPRTITER 1:16 (H) 03/10/2020  ? RPRTITER 1:8 (H) 11/22/2019  ? RPRTITER 1:32 (H) 01/01/2019  ? ?Hepatitis B ?Lab Results  ?Component Value Date  ? HEPBSAB POS (A) 10/21/2014  ? HEPBSAG NEGATIVE 10/21/2014  ? HEPBCAB NON REACTIVE 10/21/2014  ? ?Hepatitis C ?No results found for: [place], HCVRNAPCRQN ?Hepatitis A ?Lab Results  ?Component Value Date  ? HAV NON-REACTIVE 12/06/2019  ? ?Lipids: ?Lab Results  ?Component  Value Date  ? CHOL 154 10/21/2020  ? TRIG 102 10/21/2020  ? HDL 71 10/21/2020  ? CHOLHDL 2.2 10/21/2020  ? VLDL 10 09/24/2020  ? [place] 64 10/21/2020  ? ? ?Current HIV Regimen: ?Genvoya ? ?Assessment: ?Kaelen presents to the clinic today for HIV follow up after multiple missed appointments. Patient's last clinic visit was 11/25/2020 with Dr. Juleen China. Patient had a phone visit on 03/16/2021 with our counselor, Arbie Cookey. Last HIV RNA was undetectable in 09/2020 and  has been since 11/2014. Patient is currently on Genvoya. Dispense reports show last fills were 05/17/21 and 03/25/2021. ? ?Patient's ADAP expired on 05/28/2021. Patient met with financial counselor today and application is pending.  ? ?Today, patient presents tearful and mood was depressed. When asked how we could assist him today, he says that he needs his antidepressants refilled/adjusted. He says that he feels depressed and "I just want it all to end". He says he has been unable to take his Zoloft due to the provider not sending in refills for him. He last took Zoloft a few weeks ago and says he didn't notice a difference when he was taking it. Patient reports he has had thoughts of hurting himself, but denies SI or active plans of hurting himself.  ? ?Patient remains tearful and respectful throughout encounter, saying "I don't want to be here and I'm being forced to be here." Asked patient about what encouraged him to come to the clinic and he says he knows he needs to come in order to keep taking Genvoya. Patient expresses understanding the significance of taking Genvoya and says that he feels unwell when he is not taking it. Last dose reported this morning. ? ?Planned to draw labs (HIV RNA, HIV genotype, RPR, CD4, urine for cytology) today, however, patient denied bloodwork, stating he would just like to go home and that he dislikes needles. Therefore, will prioritize medication access today. Since patient's ADAP will not go into effect for a couple of  weeks, will send a month supply of patient's Zoloft and Genvoya to [facility]. Emphasized to patient that he will need to see a PCP or physician to adjust/change his antidepressant. Patient verbalized understanding

## 2021-06-24 NOTE — Telephone Encounter (Signed)
Recently sent to [facility], waiting for patient to respond to MyChart message to confirm pharmacy.  ?

## 2021-06-25 ENCOUNTER — Telehealth: Payer: Self-pay | Admitting: Student-PharmD

## 2021-06-25 MED ORDER — GENVOYA 150-150-200-10 MG PO TABS: 1.0000 | ORAL_TABLET | Freq: Every day | ORAL | 2 refills | Status: DC

## 2021-06-25 NOTE — Progress Notes (Deleted)
? ?06/25/2021 ? ?HPI: [name] is a 36 y.o. male who presents to the [facility] for HIV follow-up. ? ?Patient Active Problem List  ? Diagnosis Date Noted  ? Essential hypertension 09/28/2020  ? MDD (major depressive disorder), recurrent severe, without psychosis ([place]) 09/26/2020  ? Major depressive disorder, single episode, severe ([place])   ? Alcohol intoxication with moderate or severe use disorder ([place])   ? Cannabis use disorder, moderate, dependence ([place])   ? Hypoglycemia 09/09/2020  ? Aggressive behavior   ? Altered mental status 02/09/2020  ? Suicidal ideation 02/09/2020  ? MDD (major depressive disorder), recurrent episode, severe ([place]) 01/15/2020  ? SIRS (systemic inflammatory response syndrome) (H[facility])   ? Healthcare maintenance 12/05/2019  ? Hypoglycemia due to insulin 03/31/2019  ? Syphilis 01/15/2019  ? Closed fracture of base of fifth metatarsal bone of right foot 11/13/2017  ? Closed displaced comminuted fracture of shaft of right tibia 11/01/2017  ? Closed fracture of medial malleolus of right ankle 10/31/2017  ? Vaccine counseling 12/14/2016  ? Screening examination for venereal disease 06/09/2015  ? Encounter for long-term (current) use of medications 06/09/2015  ? Depression 03/04/2015  ? Dizziness and giddiness 03/04/2015  ? Tobacco dependence 11/12/2014  ? HIV infection ([place]) 11/11/2014  ? Stress 12/19/2012  ? Asthma 10/02/2010  ? Diabetes mellitus type 1 ([place]) 03/01/1991  ? ? ?Patient's Medications  ?New Prescriptions  ? No medications on file  ?Previous Medications  ? AMLODIPINE (NORVASC) 10 MG TABLET    Take 1 tablet (10 mg total) by mouth daily. For high blood pressure  ? BLOOD GLUCOSE METER KIT AND SUPPLIES    Dispense based on patient and insurance preference. Use up to four times daily as directed: For blood sugar monitoring  ? BLOOD GLUCOSE MONITORING SUPPL (TRUE METRIX METER) W/DEVICE KIT    USE AS DIRECTED  ? GABAPENTIN (NEURONTIN) 100 MG CAPSULE    Take 1 capsule  (100 mg total) by mouth 3 (three) times daily. For neuropathy  ? GLUCOSE BLOOD TEST STRIP    USE AS DIRECTED  ? GLUCOSE BLOOD TEST STRIP    use up to 4 times daily as directed for blood sugar monitoring  ? HYDRALAZINE (APRESOLINE) 25 MG TABLET    Take 1 tablet (25 mg total) by mouth every 8 (eight) hours. For hypertension  ? INSULIN ASPART (NOVOLOG) 100 UNIT/ML INJECTION    Inject 3 Units into the skin 3 (three) times daily with meals. For diabetes management  ? INSULIN GLARGINE (LANTUS) 100 UNIT/ML INJECTION    Inject 0.12 mLs (12 Units total) into the skin at bedtime. For diabetes management  ? INSULIN SYRINGE-NEEDLE U-100 30G X 5/16" 0.5 ML MISC    Inject 1 syringe into the skin as directed  ? SODIUM FLUORIDE (PREVIDENT 5000 PLUS) 1.1 % CREA DENTAL CREAM    Brush on teeth with a toothbrush after evening mouth care. Spit out excess and do not rinse  ? TRUEPLUS LANCETS 28G MISC    USE AS DIRECTED  ? TRUEPLUS LANCETS 28G MISC    use up to 4 times daily as directed for blood sugar monitoring  ?Modified Medications  ? Modified Medication Previous Medication  ? ELVITEGRAVIR-COBICISTAT-EMTRICITABINE-TENOFOVIR (GENVOYA) 150-150-200-10 MG TABS TABLET GENVOYA 150-150-200-10 MG TABS tablet  ?    Take 1 tablet by mouth daily with breakfast.    TAKE 1 TABLET BY MOUTH DAILY WITH BREAKFAST  ? SERTRALINE (ZOLOFT) 25 MG TABLET sertraline (ZOLOFT) 25 MG tablet  ?  Take 3 tablets (75 mg total) by mouth daily. For depression    Take 3 tablets (75 mg total) by mouth daily. For depression  ?Discontinued Medications  ? No medications on file  ? ? ?Allergies: ?Allergies  ?Allergen Reactions  ? Shellfish Allergy Anaphylaxis and Hives  ? Aspirin Other (See Comments)  ?  Unknown reaction  ? ? ?Past Medical History: ?Past Medical History:  ?Diagnosis Date  ? Anxiety   ? Asthma   ? Depression   ? Headache   ? "only w/stress" (11/01/2017)  ? HIV (human immunodeficiency virus infection) ([place])   ? Migraine   ? "a few/month" (11/01/2017)  ?  Refusal of blood transfusions as patient is Jehovah's Witness   ? Seizure ([place])   ? "only w/my low blood sugars"  (11/01/2017)  ? Type I diabetes mellitus ([place])   ? ? ?Social History: ?Social History  ? ?Socioeconomic History  ? Marital status: Single  ?  Spouse name: Not on file  ? Number of children: Not on file  ? Years of education: Not on file  ? Highest education level: Not on file  ?Occupational History  ? Not on file  ?Tobacco Use  ? Smoking status: Every Day  ?  Types: Cigars  ?  Start date: 09/29/2015  ? Smokeless tobacco: Never  ? Tobacco comments:  ?  11/01/2017  "2 cigarettes in ~ 6 month "  ?Vaping Use  ? Vaping Use: Never used  ?Substance and Sexual Activity  ? Alcohol use: Yes  ?  Alcohol/week: 7.0 standard drinks  ?  Types: 7 Cans of beer per week  ? Drug use: Not Currently  ?  Frequency: 2.0 times per week  ?  Types: Marijuana  ?  Comment: 11/01/2017  "weekly"  ? Sexual activity: Yes  ?  Partners: Male  ?  Comment: pt. declined condoms  ?Other Topics Concern  ? Not on file  ?Social History Narrative  ? Not on file  ? ?Social Determinants of Health  ? ?Financial Resource Strain: Not on file  ?Food Insecurity: Not on file  ?Transportation Needs: Not on file  ?Physical Activity: Not on file  ?Stress: Not on file  ?Social Connections: Not on file  ? ? ?Labs: ?Lab Results  ?Component Value Date  ? HIV1RNAQUANT Not Detected 10/21/2020  ? HIV1RNAQUANT <20 03/10/2020  ? HIV1RNAQUANT <20 11/22/2019  ? CD4TABS 535 10/21/2020  ? CD4TABS 582 03/10/2020  ? CD4TABS 641 11/22/2019  ? ? ?RPR and STI ?Lab Results  ?Component Value Date  ? LABRPR REACTIVE (A) 11/25/2020  ? LABRPR REACTIVE (A) 10/21/2020  ? LABRPR REACTIVE (A) 03/10/2020  ? LABRPR REACTIVE (A) 11/22/2019  ? LABRPR REACTIVE (A) 01/01/2019  ? RPRTITER 1:32 (H) 11/25/2020  ? RPRTITER 1:32 (H) 10/21/2020  ? RPRTITER 1:16 (H) 03/10/2020  ? RPRTITER 1:8 (H) 11/22/2019  ? RPRTITER 1:32 (H) 01/01/2019  ? ? ? ? ?Hepatitis B ?Lab Results  ?Component Value Date  ?  HEPBSAB POS (A) 10/21/2014  ? HEPBSAG NEGATIVE 10/21/2014  ? HEPBCAB NON REACTIVE 10/21/2014  ? ?Hepatitis C ?No results found for: [place], HCVRNAPCRQN ?Hepatitis A ?Lab Results  ?Component Value Date  ? HAV NON-REACTIVE 12/06/2019  ? ?Lipids: ?Lab Results  ?Component Value Date  ? CHOL 154 10/21/2020  ? TRIG 102 10/21/2020  ? HDL 71 10/21/2020  ? CHOLHDL 2.2 10/21/2020  ? VLDL 10 09/24/2020  ? [place] 64 10/21/2020  ? ? ?Current HIV Regimen: ?Genvoya ? ?Assessment: ?Justan presents  to the clinic today for HIV follow up after multiple missed appointments. Patient's last clinic visit was 11/25/2020 with Dr. Juleen China. Patient had a phone visit on 03/16/2021 with our counselor, Arbie Cookey. Last HIV RNA was undetectable in 09/2020 and has been since 11/2014. Patient is currently on Genvoya. Dispense reports show last fills were 05/17/21 and 03/25/2021. ? ?Patient's ADAP expired on 05/28/2021. Patient met with financial counselor today and application is pending.  ? ?Today, patient presents tearful and mood was depressed. When asked how we could assist him today, he says that he needs his antidepressants refilled/adjusted. He says that he feels depressed and "I just want it all to end". He says he has been unable to take his Zoloft due to the provider not sending in refills for him. He last took Zoloft a few weeks ago and says he didn't notice a difference when he was taking it. Patient reports he has had thoughts of hurting himself, but denies SI or active plans of hurting himself.  ? ?Patient remains tearful and respectful throughout encounter, saying "I don't want to be here and I'm being forced to be here." Asked patient about what encouraged him to come to the clinic and he says he knows he needs to come in order to keep taking Genvoya. Patient expresses understanding the significance of taking Genvoya and says that he feels unwell when he is not taking it. Last dose reported this morning. ? ?Planned to draw labs (HIV RNA, HIV  genotype, RPR, CD4, urine for cytology) today, however, patient denied bloodwork, stating he would just like to go home and that he dislikes needles. Therefore, will prioritize medication access today. Since pati

## 2021-06-25 NOTE — Telephone Encounter (Signed)
Called [facility] pharmacy to verify if they were able to fill patient's Genvoya. Pharmacy told us that they have tried to call the patient to verify he will pick this up since the medication was hundreds of dollars. Pharmacy says they will not order it in until verification. Pharmacy was not able to get in contact or leave a voicemail.  ? ?Provided pharmacy with patient's [employer] patient assistance card numbers. However, pharmacy received a reject saying the card was expired.  ? ?Will follow up with Gevonya/Zoloft prescriptions and patient's ADAP coverage to get patient's medication to him ASAP. ? ?Patient has an appointment scheduled with Okey Regal and intake with Mitch on Tuesday 5/2. ? ?Gaye Alken, PharmD Candidate ?06/25/2021 2:10 PM ? ? ?

## 2021-06-29 ENCOUNTER — Other Ambulatory Visit: Payer: Self-pay

## 2021-06-29 ENCOUNTER — Ambulatory Visit: Payer: Self-pay

## 2021-06-29 ENCOUNTER — Other Ambulatory Visit: Payer: Self-pay | Admitting: Pharmacist

## 2021-06-29 DIAGNOSIS — F332 Major depressive disorder, recurrent severe without psychotic features: Secondary | ICD-10-CM

## 2021-06-29 MED ORDER — SERTRALINE HCL 25 MG PO TABS: 75.0000 mg | ORAL_TABLET | Freq: Every day | ORAL | 0 refills | Status: DC

## 2021-06-29 NOTE — Progress Notes (Unsigned)
Therapist met with client as scheduled and discussed their progress. Therapist used active listening skills to provide client with opportunity to disclose previous challenges and successes since last session. Specific problem-solving skills were processed with client, including breaking down problems, brainstorming, evaluating, and choosing options. At times implementing a plan and evaluating and reevaluating results. Therapist assessed for SI/HI during session and will follow-up with client during the next session. ? ? ? ?Client met with therapist as scheduled and presented alert; orient X4. At the start of session, client affect appeared euthymic; affect appeared to be congruent with client report. Client appeared to make connections between consequences, challenges and alternative thoughts of mental health recovery during this session. Client expressed concerns/problems in the following areas of their life regarding their mental health. Clinician and client processed their emotions regarding his current circumstances. Client informed clinician that he was going blind and he has not been taking his medications nor has he been compliant with his diabetes medications. Client progress toward therapeutic goal(s) remain minimal at this time. Client denied SI/HI.  ?

## 2021-07-13 ENCOUNTER — Ambulatory Visit: Payer: Self-pay

## 2021-08-03 ENCOUNTER — Ambulatory Visit (INDEPENDENT_AMBULATORY_CARE_PROVIDER_SITE_OTHER): Payer: No Typology Code available for payment source | Admitting: Internal Medicine

## 2021-08-03 ENCOUNTER — Other Ambulatory Visit: Payer: Self-pay

## 2021-08-03 ENCOUNTER — Encounter: Payer: Self-pay | Admitting: Internal Medicine

## 2021-08-03 VITALS — BP 158/98 | HR 84 | Resp 16 | Ht 72.06 in | Wt 190.0 lb

## 2021-08-03 DIAGNOSIS — E1021 Type 1 diabetes mellitus with diabetic nephropathy: Secondary | ICD-10-CM

## 2021-08-03 DIAGNOSIS — B20 Human immunodeficiency virus [HIV] disease: Secondary | ICD-10-CM

## 2021-08-03 DIAGNOSIS — Z113 Encounter for screening for infections with a predominantly sexual mode of transmission: Secondary | ICD-10-CM

## 2021-08-03 DIAGNOSIS — A539 Syphilis, unspecified: Secondary | ICD-10-CM

## 2021-08-03 DIAGNOSIS — Z794 Long term (current) use of insulin: Secondary | ICD-10-CM

## 2021-08-03 DIAGNOSIS — F32A Depression, unspecified: Secondary | ICD-10-CM

## 2021-08-03 NOTE — Assessment & Plan Note (Signed)
Here for routine HIV follow up that has been under good control on Genvoya.  His labs in August 2022 showed his VL <20 and CD4 count 535 and he reports continued adherence.  Will repeat labs today.  Continue with Genvoya for now and refills are on file.  RTC 6 months.

## 2021-08-03 NOTE — Assessment & Plan Note (Signed)
Has been treated in the past.  Will repeat RPR today.

## 2021-08-03 NOTE — Assessment & Plan Note (Addendum)
His A1c has not been checked in about 1 year.  He is not following with anyone for this right now and needs a PCP to assist with management.  He has an appointment with Dr Delford Field in August for new patient visit.  I encouraged him to call sooner for an appointment if possible.

## 2021-08-03 NOTE — Assessment & Plan Note (Signed)
  Screening offered today and patient he agrees.

## 2021-08-03 NOTE — Progress Notes (Signed)
      [facility]   CHIEF COMPLAINT    HIV follow up.    SUBJECTIVE:    [name] is a 35 y.o. male with PMHx as below who presents to the clinic for HIV follow up.   Please see A&P for the details of today's visit and status of the patient's medical problems.   Patient's Medications  New Prescriptions   No medications on file  Previous Medications   AMLODIPINE (NORVASC) 10 MG TABLET    Take 1 tablet (10 mg total) by mouth daily. For high blood pressure   BLOOD GLUCOSE METER KIT AND SUPPLIES    Dispense based on patient and insurance preference. Use up to four times daily as directed: For blood sugar monitoring   BLOOD GLUCOSE MONITORING SUPPL (TRUE METRIX METER) W/DEVICE KIT    USE AS DIRECTED   ELVITEGRAVIR-COBICISTAT-EMTRICITABINE-TENOFOVIR (GENVOYA) 150-150-200-10 MG TABS TABLET    Take 1 tablet by mouth daily with breakfast.   GABAPENTIN (NEURONTIN) 100 MG CAPSULE    Take 1 capsule (100 mg total) by mouth 3 (three) times daily. For neuropathy   GLUCOSE BLOOD TEST STRIP    USE AS DIRECTED   GLUCOSE BLOOD TEST STRIP    use up to 4 times daily as directed for blood sugar monitoring   HYDRALAZINE (APRESOLINE) 25 MG TABLET    Take 1 tablet (25 mg total) by mouth every 8 (eight) hours. For hypertension   INSULIN ASPART (NOVOLOG) 100 UNIT/ML INJECTION    Inject 3 Units into the skin 3 (three) times daily with meals. For diabetes management   INSULIN GLARGINE (LANTUS) 100 UNIT/ML INJECTION    Inject 0.12 mLs (12 Units total) into the skin at bedtime. For diabetes management   INSULIN SYRINGE-NEEDLE U-100 30G X 5/16" 0.5 ML MISC    Inject 1 syringe into the skin as directed   SERTRALINE (ZOLOFT) 25 MG TABLET    Take 3 tablets (75 mg total) by mouth daily.   SODIUM FLUORIDE (PREVIDENT 5000 PLUS) 1.1 % CREA DENTAL CREAM    Brush on teeth with a toothbrush after evening mouth care. Spit out excess and do not rinse   TRUEPLUS LANCETS 28G MISC     USE AS DIRECTED   TRUEPLUS LANCETS 28G MISC    use up to 4 times daily as directed for blood sugar monitoring  Modified Medications   No medications on file  Discontinued Medications   CITALOPRAM (CELEXA) 20 MG TABLET    Take 20 mg by mouth daily.      Past Medical History:  Diagnosis Date   Anxiety    Asthma    Depression    Headache    "only w/stress" (11/01/2017)   HIV (human immunodeficiency virus infection) (HCC)    Migraine    "a few/month" (11/01/2017)   Refusal of blood transfusions as patient is Jehovah's Witness    Seizure (HCC)    "only w/my low blood sugars"  (11/01/2017)   Type I diabetes mellitus (HCC)     Social History   Tobacco Use   Smoking status: Every Day    Types: Cigars    Start date: 09/29/2015   Smokeless tobacco: Never   Tobacco comments:    11/01/2017  "2 cigarettes in ~ 6 month "  Vaping Use   Vaping Use: Never used  Substance Use Topics   Alcohol use: Yes    Alcohol/week: 7.0 standard drinks    Types: 7   Cans of beer per week   Drug use: Not Currently    Frequency: 2.0 times per week    Types: Marijuana    Comment: 11/01/2017  "weekly"    Family History  Problem Relation Age of Onset   Thyroid disease Mother    Diabetes Mother    Breast cancer Maternal Grandmother    Diabetes Maternal Grandmother     Allergies  Allergen Reactions   Shellfish Allergy Anaphylaxis and Hives   Aspirin Other (See Comments)    Unknown reaction    Review of Systems  Constitutional: Negative.   Respiratory: Negative.    Cardiovascular: Negative.   Psychiatric/Behavioral:  Positive for depression.     OBJECTIVE:    Vitals:   08/03/21 1342  BP: (!) 158/98  Pulse: 84  Resp: 16  SpO2: 98%  Weight: 190 lb (86.2 kg)  Height: 6' 0.06" (1.83 m)     Body mass index is 25.73 kg/m.  Physical Exam Constitutional:      Comments: He is withdrawn.  Affect is flat.  HENT:     Head: Normocephalic and atraumatic.     Mouth/Throat:     Mouth: Mucous  membranes are moist.     Pharynx: Oropharynx is clear.  Eyes:     Extraocular Movements: Extraocular movements intact.     Conjunctiva/sclera: Conjunctivae normal.  Pulmonary:     Effort: Pulmonary effort is normal. No respiratory distress.  Abdominal:     General: There is no distension.     Palpations: Abdomen is soft.  Musculoskeletal:        General: Normal range of motion.     Cervical back: Normal range of motion and neck supple.  Skin:    General: Skin is warm and dry.  Neurological:     General: No focal deficit present.     Mental Status: He is oriented to person, place, and time.    Labs and Microbiology:    Latest Ref Rng & Units 10/21/2020   11:42 AM 10/06/2020    6:27 AM 10/05/2020   10:52 PM  CMP  Glucose 65 - 139 mg/dL 285   303   62    BUN 7 - 25 mg/dL _0 Creatinine 0.60 - 1.26 mg/dL 1.27   1.34   1.40    Sodium 135 - 146 mmol/L 136   133   139    Potassium 3.5 - 5.3 mmol/L 4.4   4.2   3.8    Chloride 98 - 110 mmol/L 104   99   102    CO2 20 - 32 mmol/L 25   28     Calcium 8.6 - 10.3 mg/dL 9.0   9.8     Total Protein 6.1 - 8.1 g/dL 6.5      Total Bilirubin 0.2 - 1.2 mg/dL 0.4      AST 10 - 40 U/L 14      ALT 9 - 46 U/L 15          Latest Ref Rng & Units 10/21/2020   11:42 AM 10/05/2020   10:52 PM 09/24/2020    2:34 PM  CBC  WBC 3.8 - 10.8 Thousand/uL 6.1    8.3    Hemoglobin 13.2 - 17.1 g/dL 13.8   17.0   16.1    Hematocrit 38.5 - 50.0 % 40.4   50.0   46.9    Platelets 140 - 400 Thousand/uL 305  321       Lab Results  Component Value Date   HIV1RNAQUANT Not Detected 10/21/2020   HIV1RNAQUANT <20 03/10/2020   HIV1RNAQUANT <20 11/22/2019   CD4TABS 535 10/21/2020   CD4TABS 582 03/10/2020   CD4TABS 641 11/22/2019    RPR and STI: Lab Results  Component Value Date   LABRPR REACTIVE (A) 11/25/2020   LABRPR REACTIVE (A) 10/21/2020   LABRPR REACTIVE (A) 03/10/2020   LABRPR REACTIVE (A) 11/22/2019   LABRPR REACTIVE (A) 01/01/2019    RPRTITER 1:32 (H) 11/25/2020   RPRTITER 1:32 (H) 10/21/2020   RPRTITER 1:16 (H) 03/10/2020   RPRTITER 1:8 (H) 11/22/2019   RPRTITER 1:32 (H) 01/01/2019      Hepatitis B: Lab Results  Component Value Date   HEPBSAB POS (A) 10/21/2014   HEPBSAG NEGATIVE 10/21/2014   HEPBCAB NON REACTIVE 10/21/2014   Hepatitis C: No results found for: HEPCAB, HCVRNAPCRQN Hepatitis A: Lab Results  Component Value Date   HAV NON-REACTIVE 12/06/2019   Lipids: Lab Results  Component Value Date   CHOL 154 10/21/2020   TRIG 102 10/21/2020   HDL 71 10/21/2020   CHOLHDL 2.2 10/21/2020   VLDL 10 09/24/2020   LDLCALC 64 10/21/2020      ASSESSMENT & PLAN:    HIV infection (HCC) Here for routine HIV follow up that has been under good control on Genvoya.  His labs in August 2022 showed his VL <20 and CD4 count 535 and he reports continued adherence.  Will repeat labs today.  Continue with Genvoya for now and refills are on file.  RTC 6 months.  Syphilis Has been treated in the past.  Will repeat RPR today.  Screening examination for venereal disease  Screening offered today and patient he agrees.   Diabetes mellitus type 1 (HCC) His A1c has not been checked in about 1 year.  He is not following with anyone for this right now and needs a PCP to assist with management.  I have given him phone number for [facility] Clinic for follow up.  Depression Continues to be an ongoing issue for him.  He is currently intermittently taking Zoloft 75mg daily but states this does not work.  He is not taking Celexa anymore and has not been seeing psychiatry or counseling.  He denies SI/HI today.   Orders Placed This Encounter  Procedures   CBC   COMPLETE METABOLIC PANEL WITH GFR   HIV-1 RNA quant-no reflex-bld   T-helper cell (CD4)- ([facility] clinic only)   RPR       N  [facility] 08/03/2021, 2:10 PM    I have personally spent 40  minutes involved in face-to-face and non-face-to-face activities for this patient on the day of the visit. Professional time spent includes the following activities: Preparing to see the patient (review of tests), Obtaining and/or reviewing separately obtained history (admission/discharge record), Performing a medically appropriate examination and/or evaluation , Ordering medications/tests/procedures, referring and communicating with other health care professionals, Documenting clinical information in the EMR, Independently interpreting results (not separately reported), Communicating results to the patient/family/caregiver, Counseling and educating the patient/family/caregiver and Care coordination (not separately reported).     

## 2021-08-03 NOTE — Patient Instructions (Addendum)
Thank you for coming to see me today. It was a pleasure seeing you.  To Do: Labs and swabs today Follow up with me in 6 months and let us know if you need anything Call the Dr Lynelle Doctor office for appointment sooner than August if possible  If you have any questions or concerns, please do not hesitate to call the office at [phone].  Take Care,   Gwynn Burly

## 2021-08-03 NOTE — Assessment & Plan Note (Signed)
Continues to be an ongoing issue for him.  He is currently intermittently taking Zoloft 75mg  daily but states this does not work.  He is not taking Celexa anymore and has not been seeing psychiatry or counseling.  He denies SI/HI today.

## 2021-10-17 NOTE — Progress Notes (Signed)
New Patient Office Visit  Subjective    Patient ID: [name], male    DOB: August 23, 1985  Age: 36 y.o. MRN: [id number]  CC: No chief complaint on file.   HPI [name] presents to establish care Not seen since 2021 T1DM HTN MDD HIV THC use Syphilis, smoker fx ankle tibia R  Foot A1C labs eye  HIV clinic 07/2021 HIV infection [name]) Here for routine HIV follow up that has been under good control on Genvoya.  His labs in August 2022 showed his VL <20 and CD4 count 535 and he reports continued adherence.  Will repeat labs today.  Continue with Genvoya for now and refills are on file.  RTC 6 months.   Syphilis Has been treated in the past.  Will repeat RPR today.   Screening examination for venereal disease   Screening offered today and patient he agrees.    Diabetes mellitus type 1 ([place]) His A1c has not been checked in about 1 year.  He is not following with anyone for this right now and needs a PCP to assist with management.  I have given him phone number for Internal Medicine Clinic for follow up.   Depression Continues to be an ongoing issue for him.  He is currently intermittently taking Zoloft $RemoveBefor'75mg'dtdDEEcomnvM$  daily but states this does not work.  He is not taking Celexa anymore and has not been seeing psychiatry or counseling.  He  Outpatient Encounter Medications as of 10/18/2021  Medication Sig  . amLODipine (NORVASC) 10 MG tablet Take 1 tablet (10 mg total) by mouth daily. For high blood pressure  . blood glucose meter kit and supplies Dispense based on patient and insurance preference. Use up to four times daily as directed: For blood sugar monitoring  . Blood Glucose Monitoring Suppl (TRUE METRIX METER) w/Device KIT USE AS DIRECTED  . elvitegravir-cobicistat-emtricitabine-tenofovir (GENVOYA) 150-150-200-10 MG TABS tablet Take 1 tablet by mouth daily with breakfast.  . gabapentin (NEURONTIN) 100 MG capsule Take 1 capsule (100 mg total) by mouth 3 (three) times  daily. For neuropathy  . glucose blood test strip use up to 4 times daily as directed for blood sugar monitoring  . hydrALAZINE (APRESOLINE) 25 MG tablet Take 1 tablet (25 mg total) by mouth every 8 (eight) hours. For hypertension  . insulin aspart (NOVOLOG) 100 UNIT/ML injection Inject 3 Units into the skin 3 (three) times daily with meals. For diabetes management  . insulin glargine (LANTUS) 100 UNIT/ML injection Inject 0.12 mLs (12 Units total) into the skin at bedtime. For diabetes management  . Insulin Syringe-Needle U-100 30G X 5/16" 0.5 ML MISC Inject 1 syringe into the skin as directed  . sertraline (ZOLOFT) 25 MG tablet Take 3 tablets (75 mg total) by mouth daily.  . sodium fluoride (PREVIDENT 5000 PLUS) 1.1 % CREA dental cream Brush on teeth with a toothbrush after evening mouth care. Spit out excess and do not rinse  . TRUEplus Lancets 28G MISC use up to 4 times daily as directed for blood sugar monitoring  . [DISCONTINUED] insulin lispro (HUMALOG KWIKPEN) 100 UNIT/ML KwikPen Inject 0.1 mLs (10 Units total) into the skin 3 (three) times daily with meals. ICD 10 E10.42 (Patient not taking: Reported on 03/31/2019)  . [DISCONTINUED] pravastatin (PRAVACHOL) 20 MG tablet Take 1 tablet (20 mg total) by mouth daily. (Patient not taking: Reported on 03/31/2019)   No facility-administered encounter medications on file as of 10/18/2021.    Past Medical History:  Diagnosis  Date  . Anxiety   . Asthma   . Depression   . Headache    "only w/stress" (11/01/2017)  . HIV (human immunodeficiency virus infection) ([place])   . Migraine    "a few/month" (11/01/2017)  . Refusal of blood transfusions as patient is Jehovah's Witness   . Seizure ([place])    "only w/my low blood sugars"  (11/01/2017)  . Type I diabetes mellitus ([place])     Past Surgical History:  Procedure Laterality Date  . FRACTURE SURGERY    . IM NAILING TIBIA Right 11/01/2017   INTRAMEDULLARY (IM) NAIL TIBIALRightGeneral  . ORIF ANKLE  FRACTURE Right 11/01/2017  . ORIF ANKLE FRACTURE Right 11/01/2017   Procedure: OPEN REDUCTION INTERNAL FIXATION (ORIF) ANKLE FRACTURE;  Surgeon: Shona Needles, MD;  Location: [facility];  Service: Orthopedics;  Laterality: Right;  . TIBIA IM NAIL INSERTION Right 11/01/2017   Procedure: INTRAMEDULLARY (IM) NAIL TIBIAL;  Surgeon: Shona Needles, MD;  Location: [facility];  Service: Orthopedics;  Laterality: Right;    Family History  Problem Relation Age of Onset  . Thyroid disease Mother   . Diabetes Mother   . Breast cancer Maternal Grandmother   . Diabetes Maternal Grandmother     Social History   Socioeconomic History  . Marital status: Single    Spouse name: Not on file  . Number of children: Not on file  . Years of education: Not on file  . Highest education level: Not on file  Occupational History  . Not on file  Tobacco Use  . Smoking status: Every Day    Types: Cigars    Start date: 09/29/2015  . Smokeless tobacco: Never  . Tobacco comments:    11/01/2017  "2 cigarettes in ~ 6 month "  Vaping Use  . Vaping Use: Never used  Substance and Sexual Activity  . Alcohol use: Yes    Alcohol/week: 7.0 standard drinks of alcohol    Types: 7 Cans of beer per week  . Drug use: Not Currently    Frequency: 2.0 times per week    Types: Marijuana    Comment: 11/01/2017  "weekly"  . Sexual activity: Yes    Partners: Male    Comment: pt. declined condoms  Other Topics Concern  . Not on file  Social History Narrative  . Not on file   Social Determinants of Health   Financial Resource Strain: Not on file  Food Insecurity: Not on file  Transportation Needs: Not on file  Physical Activity: Not on file  Stress: Not on file  Social Connections: Not on file  Intimate Partner Violence: Not on file    ROS      Objective    There were no vitals taken for this visit.  Physical Exam  {Labs (Optional):23779}    Assessment & Plan:   Problem List Items Addressed This Visit    None   No follow-ups on file.   Asencion Noble, MD

## 2021-10-18 ENCOUNTER — Ambulatory Visit: Payer: Self-pay | Attending: Critical Care Medicine | Admitting: Critical Care Medicine

## 2021-10-18 ENCOUNTER — Other Ambulatory Visit: Payer: Self-pay

## 2021-10-18 ENCOUNTER — Encounter: Payer: Self-pay | Admitting: Critical Care Medicine

## 2021-10-18 VITALS — BP 175/103 | HR 97 | Ht 72.0 in | Wt 191.6 lb

## 2021-10-18 DIAGNOSIS — F17290 Nicotine dependence, other tobacco product, uncomplicated: Secondary | ICD-10-CM

## 2021-10-18 DIAGNOSIS — Z0000 Encounter for general adult medical examination without abnormal findings: Secondary | ICD-10-CM

## 2021-10-18 DIAGNOSIS — I10 Essential (primary) hypertension: Secondary | ICD-10-CM

## 2021-10-18 DIAGNOSIS — F332 Major depressive disorder, recurrent severe without psychotic features: Secondary | ICD-10-CM

## 2021-10-18 DIAGNOSIS — Z59812 Housing instability, housed, homelessness in past 12 months: Secondary | ICD-10-CM

## 2021-10-18 DIAGNOSIS — R45851 Suicidal ideations: Secondary | ICD-10-CM

## 2021-10-18 DIAGNOSIS — Z5329 Procedure and treatment not carried out because of patient's decision for other reasons: Secondary | ICD-10-CM

## 2021-10-18 DIAGNOSIS — E1021 Type 1 diabetes mellitus with diabetic nephropathy: Secondary | ICD-10-CM

## 2021-10-18 LAB — POCT GLYCOSYLATED HEMOGLOBIN (HGB A1C): HbA1c, POC (controlled diabetic range): 8.1 % — AB (ref 0.0–7.0)

## 2021-10-18 LAB — GLUCOSE, POCT (MANUAL RESULT ENTRY): POC Glucose: 222 mg/dl — AB (ref 70–99)

## 2021-10-18 MED ORDER — GLUCOSE BLOOD VI STRP
ORAL_STRIP | 0 refills | Status: DC
  Filled 2021-10-18 – 2021-10-25 (×2): qty 100, 25d supply, fill #0

## 2021-10-18 MED ORDER — TRUEPLUS LANCETS 28G MISC
1 refills | Status: DC
  Filled 2021-10-18 – 2021-10-25 (×2): qty 100, 25d supply, fill #0

## 2021-10-18 MED ORDER — AMLODIPINE BESYLATE 10 MG PO TABS
10.0000 mg | ORAL_TABLET | Freq: Every day | ORAL | 0 refills | Status: DC
  Filled 2021-10-18 – 2021-10-25 (×2): qty 30, 30d supply, fill #0

## 2021-10-18 MED ORDER — INSULIN LISPRO (1 UNIT DIAL) 100 UNIT/ML (KWIKPEN)
5.0000 [IU] | PEN_INJECTOR | Freq: Two times a day (BID) | SUBCUTANEOUS | 2 refills | Status: DC
Start: 2021-10-18 — End: 2022-02-10
  Filled 2021-10-18 – 2021-10-25 (×2): qty 3, 30d supply, fill #0
  Filled 2021-11-14 – 2021-11-19 (×2): qty 3, 30d supply, fill #1
  Filled 2021-12-17 – 2021-12-27 (×2): qty 3, 30d supply, fill #2
  Filled 2022-01-24: qty 3, 30d supply, fill #3

## 2021-10-18 MED ORDER — INSULIN PEN NEEDLE 31G X 5 MM MISC
1 refills | Status: DC
  Filled 2021-10-18: qty 100, 33d supply, fill #0
  Filled 2021-10-25: qty 100, 25d supply, fill #0
  Filled 2021-11-18: qty 100, 30d supply, fill #1

## 2021-10-18 MED ORDER — BASAGLAR KWIKPEN 100 UNIT/ML ~~LOC~~ SOPN
15.0000 [IU] | PEN_INJECTOR | Freq: Every day | SUBCUTANEOUS | 2 refills | Status: DC
  Filled 2021-10-18 – 2021-10-25 (×2): qty 6, 40d supply, fill #0
  Filled 2021-11-18 – 2021-11-29 (×2): qty 6, 40d supply, fill #1
  Filled 2021-12-17 – 2021-12-27 (×2): qty 6, 40d supply, fill #2

## 2021-10-18 MED ORDER — VALSARTAN-HYDROCHLOROTHIAZIDE 320-25 MG PO TABS
1.0000 | ORAL_TABLET | Freq: Every day | ORAL | 3 refills | Status: DC
  Filled 2021-10-18: qty 90, 90d supply, fill #0
  Filled 2021-10-25: qty 30, 30d supply, fill #0
  Filled 2021-11-18: qty 30, 30d supply, fill #1
  Filled 2021-12-17 – 2021-12-27 (×2): qty 30, 30d supply, fill #2

## 2021-10-18 MED ORDER — CITALOPRAM HYDROBROMIDE 20 MG PO TABS
20.0000 mg | ORAL_TABLET | Freq: Every day | ORAL | 2 refills | Status: DC
  Filled 2021-10-18 – 2021-10-25 (×2): qty 30, 30d supply, fill #0

## 2021-10-18 NOTE — Patient Instructions (Addendum)
Resume citalopram 1 daily sent to the pharmacy downstairs  Stop insulin 70/30 and begin insulin Basaglar 15 units daily and insulin lispro 5 units before lunch and supper but hold if blood sugar less than 150, these are insulin pens and pen needles will be issued as well  Start atorvastatin 1 pill daily for cholesterol  Start valsartan HCT 1 daily for blood pressure and amlodipine 1 daily for blood pressure  Complete screening labs were obtained at this visit including a urine for protein  Return to Dr. Joya Gaskins in 1 month    You met with our clinical social worker Ms. Nevada Crane we recommend you go to [facility] urgent care or outpatient clinic as below to be seen and get management with mental health [facility] [address], [place], [place] [address] [phone] or [phone] Walk-in urgent care 24/7 for anyone  For [facility] ONLY New patient assessments and therapy walk-ins: Monday and Wednesday 8am-11am First and second Friday 1pm-5pm New patient psychiatry and medication management walk-ins: Mondays, Wednesdays, Thursdays, Fridays 8am-11am No psychiatry walk-ins on Tuesday

## 2021-10-18 NOTE — Assessment & Plan Note (Signed)
Type 1 diabetes since childhood A1c of 8.1 has been using 70/30 insulin at home on his own  Ideally would like to get this patient on a statin and on long-acting insulin and short acting insulin  I had prepared prescriptions for this patient when he determined he was going to leave the clinic without further follow-up and abruptly left the clinic see assessment for depression

## 2021-10-18 NOTE — Assessment & Plan Note (Signed)
This patient's had chronic suicidal ideation the clinical social worker Ms. Hall and I both assessed the patient and felt that he was a risk and I signed involuntary commitment papers.  The patient was very restless Getting up moving around we were unable to keep him in the clinic he walked out just as the police arrived to escort him to mental health.  The patient was able to leave campus without their ability to stop him.  I did sign involuntary commitment papers

## 2021-10-18 NOTE — Assessment & Plan Note (Signed)
As per suicidal ideation

## 2021-10-18 NOTE — Assessment & Plan Note (Signed)
I had planned a series of healthcare maintenance labs today but he walked out without completing

## 2021-10-18 NOTE — Assessment & Plan Note (Signed)
Needs treatment for blood pressure I had determined I would start him on 2 different blood pressure medications but he left the clinic before picking up prescriptions

## 2021-10-25 ENCOUNTER — Other Ambulatory Visit: Payer: Self-pay

## 2021-10-26 ENCOUNTER — Other Ambulatory Visit: Payer: Self-pay

## 2021-10-26 NOTE — Progress Notes (Signed)
[place] Screening    10/18/2021 Name: [name] MRN: [id number] DOB: 1986/01/31 [name] is a 36 y.o. year old male who sees Elsie Stain, MD for primary care. LCSWA was consulted to assess mental health needs and assist the patient with . Assessed thoughts of SI, plan and access to means.Patient did not have a plan. Patient reported thoughts of not want to be here. Although patient did not have a plan, we highly suggested patient go to behavioral health to evaluate his mental health concerns. Patient refused services and after monitoring patient he became more frustrated and "stressed" with the idea of going to [employer]. He continued to remind LCSWA and Dr. Joya Gaskins of how he "did not want to be here the next day" and going to [facility] was not going to help him. LCSWA called the non-emergency line and completed IVC paperwork for patient for his safety after he abruptly left the clinic in the middle of receiving services.   SUBJECTIVE: Presenting issue / symptoms/concerns: Major Depression, crying, and high blood pressure Duration of symptoms/ how impacting :He has been feeling this way all his life since he was boy. But the past few months have been even harder for him. Recent life changes: Has been homeless for a while, w/o a car. He walked to his appointment from across town. Patient says he cant keep a job due to his illnesses. Family / Social support: Patient stated he does not have any support from his family, his mother is very ill and he can't go to her for support and doesn't want to.   Mood: Negative, Depressed, Hopeless, and Worthless Affect: Depressed, Irritable, and Tearful  OBJECTIVE:  Psychiatric History - Diagnoses: Major Depressive Disorder - Hospitalizations/ prior attempts:  yes  - Pharmacotherapy: n/a - Outpatient therapy: offered but patient refused.  Family history of psychiatric issues:  unknown Current  and history of substance use:  none  GAD score of 20 is an indication of  severe anxiety.  PHQ-9 score of 21 is an indication of severe depression.    10/25/2021   12:21 PM 08/03/2021    1:38 PM 10/30/2020    2:07 PM  Depression screen PHQ 2/9  Decreased Interest 0 1 3  Down, Depressed, Hopeless 3 2 1   PHQ - 2 Score 3 3 4   Altered sleeping 3 3 3   Tired, decreased energy 3 3 3   Change in appetite 3 0 3  Feeling bad or failure about yourself  3 0 3  Trouble concentrating 3 2 3   Moving slowly or fidgety/restless  0 1  Suicidal thoughts 3 3 3   PHQ-9 Score 21 14 23   Difficult doing work/chores  Extremely dIfficult Very difficult        10/25/2021   12:21 PM  GAD 7 : Generalized Anxiety Score  Nervous, Anxious, on Edge 3  Control/stop worrying 3  Worry too much - different things 3  Trouble relaxing 3  Restless 2  Easily annoyed or irritable 3  Afraid - awful might happen 3  Total GAD 7 Score 20    Outpatient Encounter Medications as of 10/18/2021  Medication Sig   elvitegravir-cobicistat-emtricitabine-tenofovir (GENVOYA) 150-150-200-10 MG TABS tablet Take 1 tablet by mouth daily with breakfast.   Insulin Glargine (BASAGLAR KWIKPEN) 100 UNIT/ML Inject 15 Units into the skin daily.   insulin lispro (HUMALOG) 100 UNIT/ML KwikPen Inject 5 Units into the skin 2 (two) times daily before lunch and supper. Hold if  blood sugar less than 150   Insulin Pen Needle 31G X 5 MM MISC Use with insulin pen   valsartan-hydrochlorothiazide (DIOVAN-HCT) 320-25 MG tablet Take 1 tablet by mouth daily.   amLODipine (NORVASC) 10 MG tablet Take 1 tablet (10 mg total) by mouth daily. For high blood pressure   blood glucose meter kit and supplies Dispense based on patient and insurance preference. Use up to four times daily as directed: For blood sugar monitoring   [EXPIRED] Blood Glucose Monitoring Suppl (TRUE METRIX METER) w/Device KIT USE AS DIRECTED   citalopram (CELEXA) 20 MG tablet Take 1 tablet (20 mg  total) by mouth daily.   gabapentin (NEURONTIN) 100 MG capsule Take 1 capsule (100 mg total) by mouth 3 (three) times daily. For neuropathy   glucose blood test strip use up to 4 times daily as directed for blood sugar monitoring   hydrALAZINE (APRESOLINE) 25 MG tablet Take 1 tablet (25 mg total) by mouth every 8 (eight) hours. For hypertension   Insulin Syringe-Needle U-100 30G X 5/16" 0.5 ML MISC Inject 1 syringe into the skin as directed   sodium fluoride (PREVIDENT 5000 PLUS) 1.1 % CREA dental cream Brush on teeth with a toothbrush after evening mouth care. Spit out excess and do not rinse   TRUEplus Lancets 28G MISC use up to 4 times daily as directed for blood sugar monitoring   [DISCONTINUED] amLODipine (NORVASC) 10 MG tablet Take 1 tablet (10 mg total) by mouth daily. For high blood pressure   [DISCONTINUED] citalopram (CELEXA) 20 MG tablet Take 20 mg by mouth daily.   [DISCONTINUED] glucose blood test strip use up to 4 times daily as directed for blood sugar monitoring   [DISCONTINUED] insulin aspart (NOVOLOG) 100 UNIT/ML injection Inject 3 Units into the skin 3 (three) times daily with meals. For diabetes management (Patient not taking: Reported on 10/18/2021)   [DISCONTINUED] insulin glargine (LANTUS) 100 UNIT/ML injection Inject 0.12 mLs (12 Units total) into the skin at bedtime. For diabetes management (Patient not taking: Reported on 10/18/2021)   [DISCONTINUED] insulin lispro (HUMALOG KWIKPEN) 100 UNIT/ML KwikPen Inject 0.1 mLs (10 Units total) into the skin 3 (three) times daily with meals. ICD 10 E10.42 (Patient not taking: Reported on 03/31/2019)   [DISCONTINUED] pravastatin (PRAVACHOL) 20 MG tablet Take 1 tablet (20 mg total) by mouth daily. (Patient not taking: Reported on 03/31/2019)   [DISCONTINUED] sertraline (ZOLOFT) 25 MG tablet Take 3 tablets (75 mg total) by mouth daily.   [DISCONTINUED] TRUEplus Lancets 28G MISC use up to 4 times daily as directed for blood sugar monitoring    No facility-administered encounter medications on file as of 10/18/2021.    Review of patient status, including review of consultants reports, relevant laboratory and other test results, and collaboration with appropriate care team members and the patient's provider was performed as part of comprehensive patient evaluation and provision of services.    Assessment: Patient is currently experiencing symptoms of agitation, anxiety, depression, and stress which are exacerbated by his major depression and living situation.   Recommendation: Patient may benefit from, but is NOT in agreement to go to behavioral health for evaluation and medication management for major depressive disorder and on going therapy. Resources for housing and centers to apply for employment.    Intervention:Patient interviewed and appropriate assessments performed: GAD 7 PHQ 9 Patient interviewed and appropriate assessments performed Advised patient to   to behavioral health for evaluation and medication management for major depressive disorder and on going therapy.  Resources for housing and centers to apply for employment.  PHQ2/ PHQ9 completed Active listening / Reflection utilized  Involuntary Commitment forms were completed and submitted.    SDOH (Social Determinants of Health) assessments performed: Yes    Goals Addressed   None     Follow Up Plan:  1.Safety Check via phone call    Rosana Hoes, [place] [facility]  Primary Care at [facility] Direct Line: [phone] or [phone] Email: [email or web address]

## 2021-11-15 ENCOUNTER — Other Ambulatory Visit: Payer: Self-pay

## 2021-11-18 ENCOUNTER — Other Ambulatory Visit: Payer: Self-pay | Admitting: Family

## 2021-11-18 ENCOUNTER — Other Ambulatory Visit: Payer: Self-pay | Admitting: Critical Care Medicine

## 2021-11-18 ENCOUNTER — Other Ambulatory Visit: Payer: Self-pay

## 2021-11-18 DIAGNOSIS — I10 Essential (primary) hypertension: Secondary | ICD-10-CM

## 2021-11-18 MED ORDER — TRUE METRIX BLOOD GLUCOSE TEST VI STRP
ORAL_STRIP | 2 refills | Status: DC
  Filled 2021-11-18: qty 100, 25d supply, fill #0
  Filled 2021-12-17: qty 100, 25d supply, fill #1

## 2021-11-19 ENCOUNTER — Other Ambulatory Visit: Payer: Self-pay

## 2021-11-19 MED ORDER — HYDRALAZINE HCL 25 MG PO TABS
25.0000 mg | ORAL_TABLET | Freq: Three times a day (TID) | ORAL | 2 refills | Status: DC
  Filled 2021-11-19: qty 90, 30d supply, fill #0
  Filled 2021-12-17 – 2021-12-27 (×2): qty 90, 30d supply, fill #1

## 2021-11-19 NOTE — Telephone Encounter (Signed)
Requested medication (s) are due for refill today:   Yes  Requested medication (s) are on the active medication list:   Yes  Future visit scheduled:   No  Seen a month ago by Dr. Joya Gaskins   Last ordered: 11/03/2020 #90, 2 refills  Returned because per protocol labs are overdue.      Requested Prescriptions  Pending Prescriptions Disp Refills   hydrALAZINE (APRESOLINE) 25 MG tablet 90 tablet 2    Sig: Take 1 tablet (25 mg total) by mouth every 8 (eight) hours. For hypertension     Cardiovascular:  Vasodilators Failed - 11/18/2021  1:23 PM      Failed - HCT in normal range and within 360 days    HCT  Date Value Ref Range Status  10/21/2020 40.4 38.5 - 50.0 % Final         Failed - HGB in normal range and within 360 days    Hemoglobin  Date Value Ref Range Status  10/21/2020 13.8 13.2 - 17.1 g/dL Final         Failed - RBC in normal range and within 360 days    RBC  Date Value Ref Range Status  10/21/2020 4.41 4.20 - 5.80 Million/uL Final         Failed - WBC in normal range and within 360 days    WBC  Date Value Ref Range Status  10/21/2020 6.1 3.8 - 10.8 Thousand/uL Final         Failed - PLT in normal range and within 360 days    Platelets  Date Value Ref Range Status  10/21/2020 305 140 - 400 Thousand/uL Final         Failed - ANA Screen, Ifa, Serum in normal range and within 360 days    No results found for: "ANA", "ANATITER", "LABANTI"       Failed - Last BP in normal range    BP Readings from Last 1 Encounters:  10/18/21 (!) 175/103         Passed - Valid encounter within last 12 months    Recent Outpatient Visits           1 month 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([place])   [place] Elsie Stain, MD   1 year ago Encounter to establish care   [facility] at [facility], Amy J, NP   2 years ago Type 1 diabetes mellitus with diabetic polyneuropathy [facility])   [place], Vernia Buff, NP

## 2021-11-22 ENCOUNTER — Other Ambulatory Visit: Payer: Self-pay

## 2021-11-22 ENCOUNTER — Other Ambulatory Visit: Payer: Self-pay | Admitting: Pharmacist

## 2021-11-22 ENCOUNTER — Ambulatory Visit: Payer: Self-pay

## 2021-11-22 DIAGNOSIS — F332 Major depressive disorder, recurrent severe without psychotic features: Secondary | ICD-10-CM

## 2021-11-22 NOTE — Telephone Encounter (Signed)
Okay to refill? I saw your note mentioned that Zoloft wasn't working well for him - thanks!

## 2021-11-26 ENCOUNTER — Other Ambulatory Visit: Payer: Self-pay

## 2021-11-29 ENCOUNTER — Other Ambulatory Visit: Payer: Self-pay

## 2021-12-17 ENCOUNTER — Other Ambulatory Visit: Payer: Self-pay

## 2021-12-23 ENCOUNTER — Other Ambulatory Visit: Payer: Self-pay

## 2021-12-23 ENCOUNTER — Telehealth: Payer: Self-pay

## 2021-12-23 NOTE — Telephone Encounter (Signed)
Patient called upset and wanting to talk to [facility]. I discussed [place] hours and talked getting him to open up. He did state he has had issues calling the crisis line and does not feel he needs to call them. He is not depressed and does not fear of harming himself. He is stressed and having anxiety and would like to talk to [place]. I put him on hold and got the next available appointment scheduled (01/18/22) and got the flyer with phone number to [place] office at [facility]. Upon coming back to the line he was gone. I tried multiple times to reconnect but his vm is full and he will not answer. I will continue to try him as well as reach out to [place].   If he calls back:  Number to reach Arbie Cookey is [phone] Appt w Arbie Cookey 01/18/22 12 noon NEEDS [facility] APPT  AND LAB ALSO-

## 2021-12-27 ENCOUNTER — Other Ambulatory Visit: Payer: Self-pay

## 2021-12-30 NOTE — Telephone Encounter (Signed)
Staff attempted to follow up with patient to check in. Phone was answered, staff was not able to identify caller after stating "nurse following up from [place]". Caller ended the phone call at this time.   Patient currently has been recommended to follow up with [facility] to assist with management with mental health. Staff will also send patient a Mychart message with contact information.   Patient also has a appointment with therapist on 01/18/22 who is only at [facility] once weekly on Tuesday.  Eugenia Mcalpine

## 2022-01-15 IMAGING — CT CT ABD-PELV W/ CM
2 of 4 series · 16 of 46 positions shown, 18 images · IV contrast (omnipaque)
Comparison: None.

CLINICAL DATA: Nausea and vomiting

EXAM:
CT ABDOMEN AND PELVIS WITH CONTRAST
TECHNIQUE: Multidetector CT imaging of the abdomen and pelvis was performed
using the standard protocol following bolus administration of
intravenous contrast.
CONTRAST:  100mL OMNIPAQUE IOHEXOL 300 MG/ML  SOLN

[Series 3: abd/ pelvis 5.0 i30f 2 · axial · 0.73mm/px · z∈[-581,-116]mm · 13 of 103 slices shown, 15 images]
[im 5/103  soft-tissue]
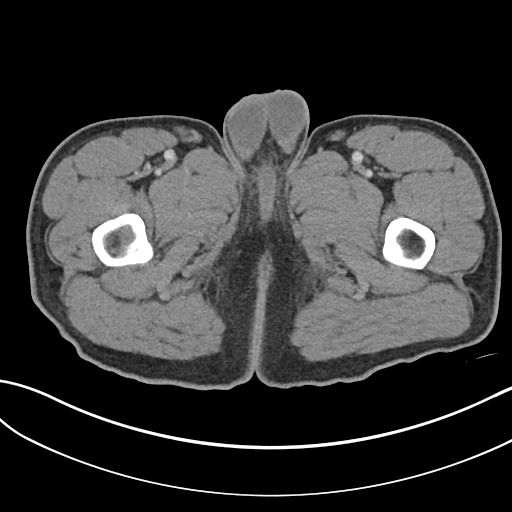
[im 5/103  bone]
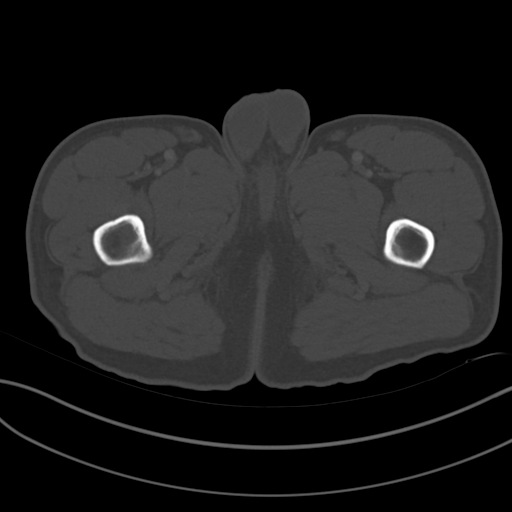
[im 14/103  soft-tissue]
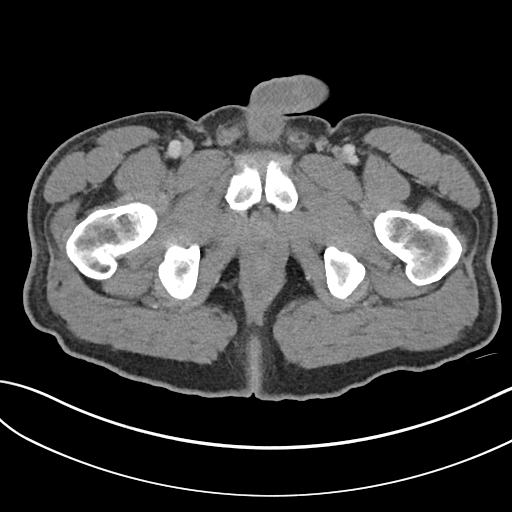
[im 24/103  soft-tissue]
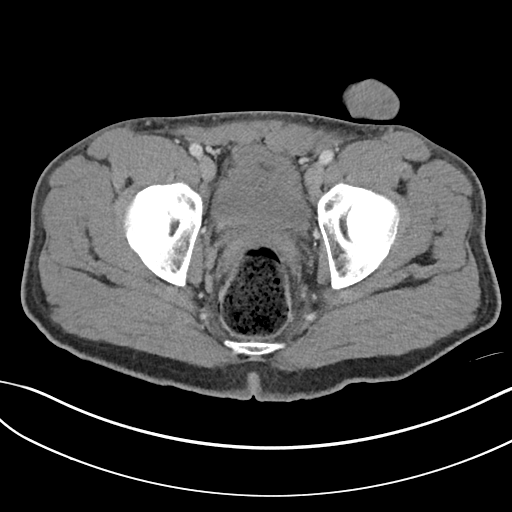
[im 28/103  soft-tissue]
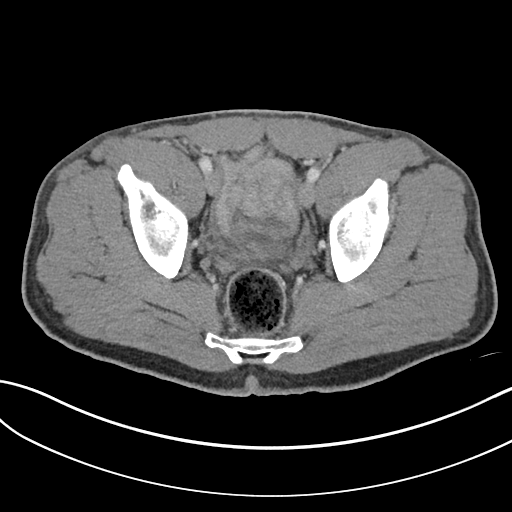
[im 38/103  soft-tissue]
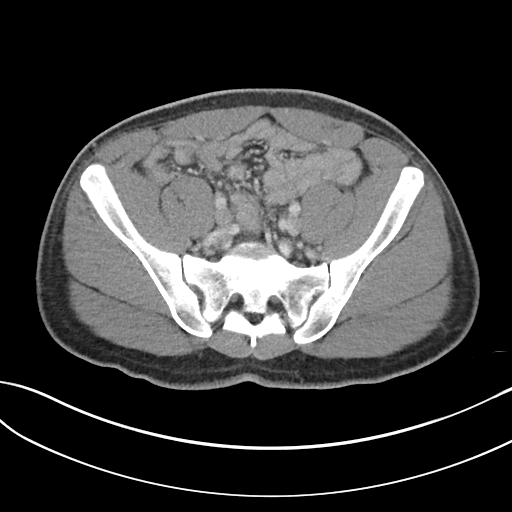
[im 42/103  soft-tissue]
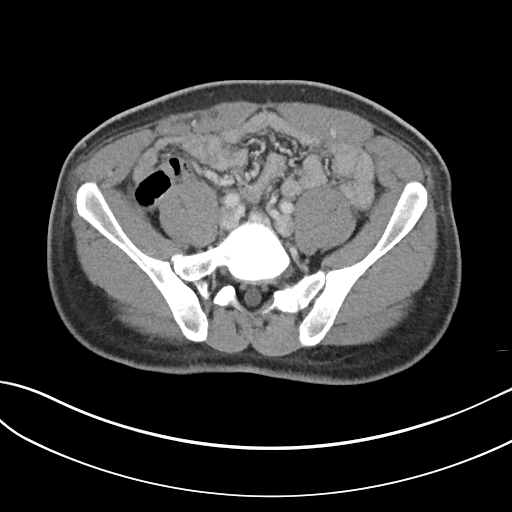
[im 52/103  soft-tissue]
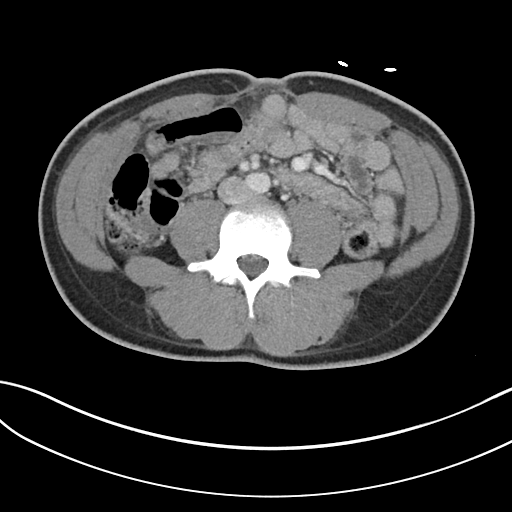
[im 61/103  soft-tissue]
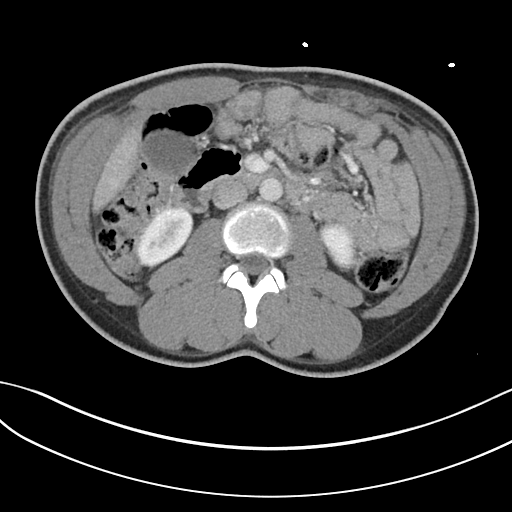
[im 65/103  soft-tissue]
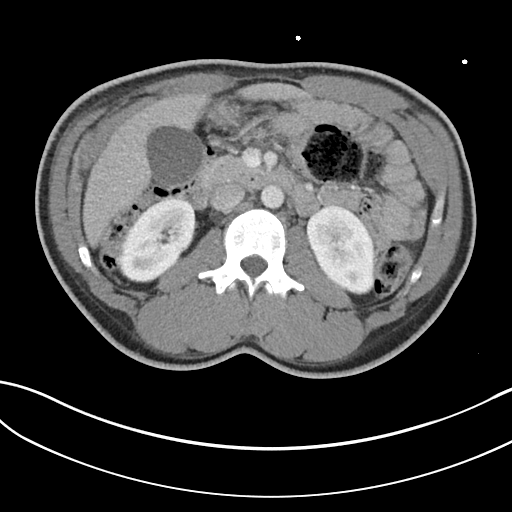
[im 65/103  bone]
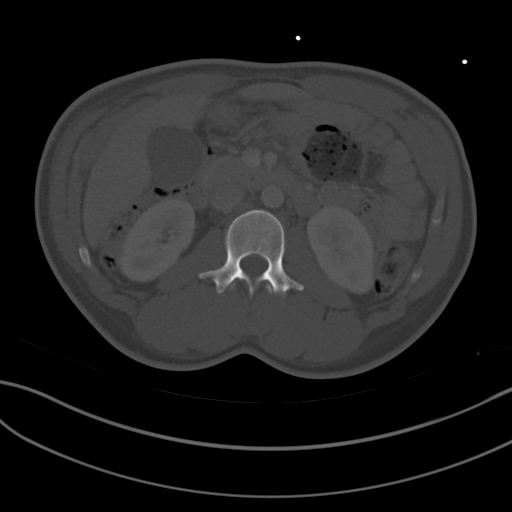
[im 75/103  soft-tissue]
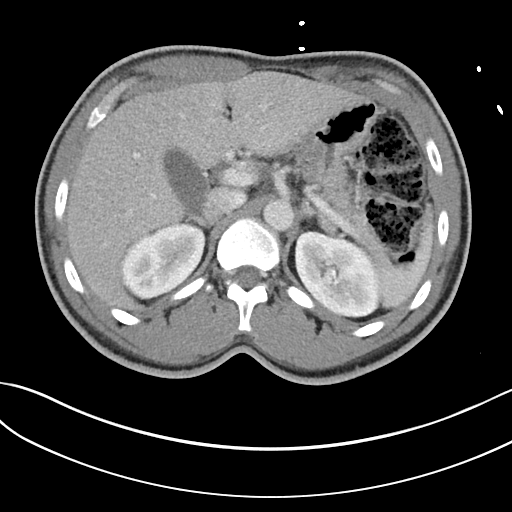
[im 79/103  soft-tissue]
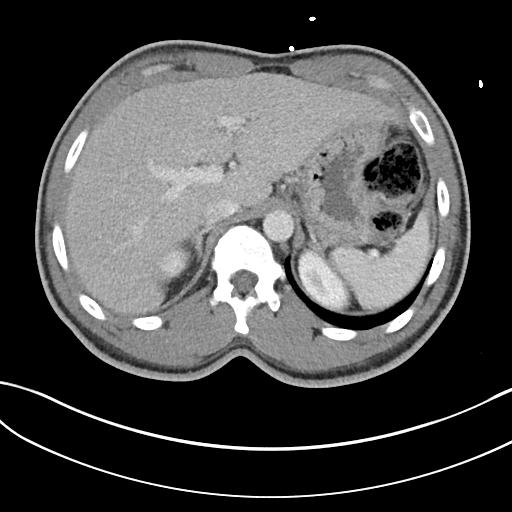
[im 89/103  soft-tissue]
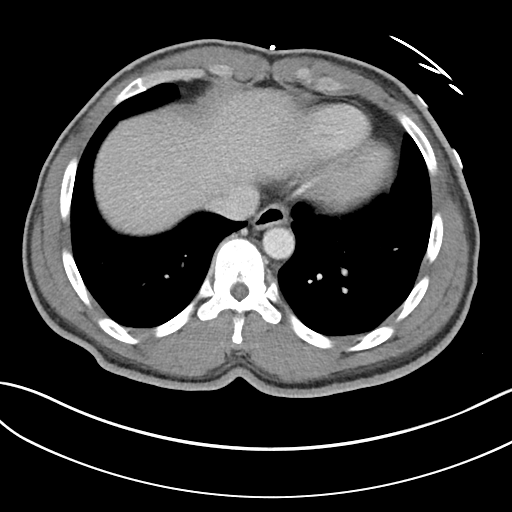
[im 98/103  soft-tissue]
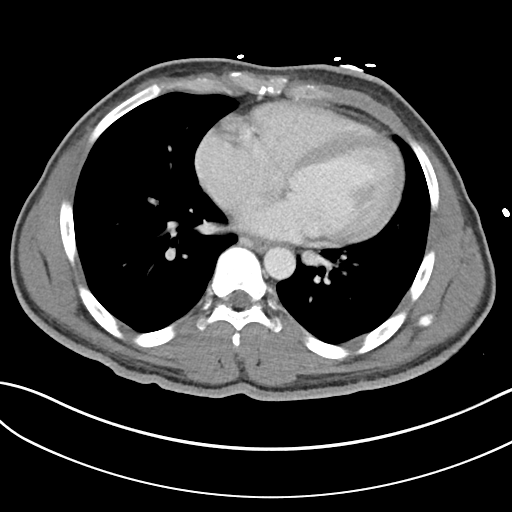

[Series 6: coronal soft tissue · coronal · 0.70mm/px · 3 of 85 slices shown]
[im 29/85  soft-tissue]
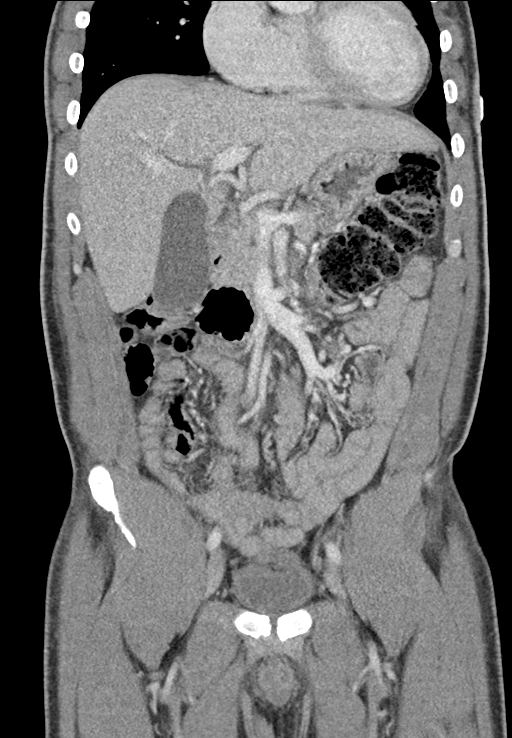
[im 38/85  soft-tissue]
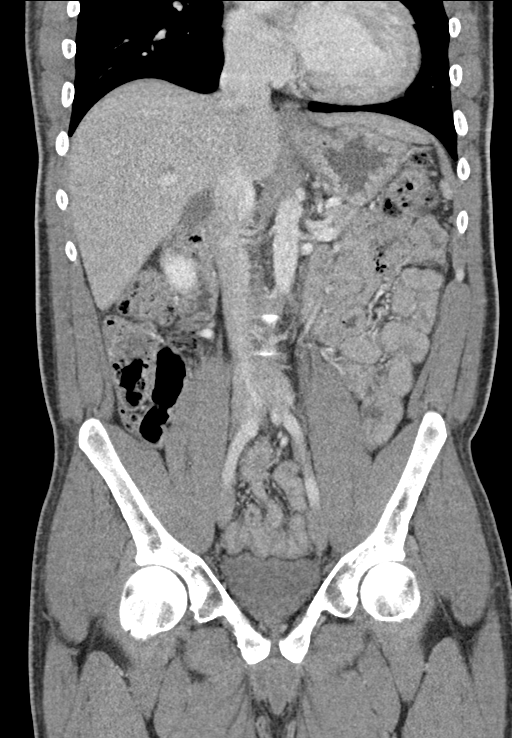
[im 47/85  soft-tissue]
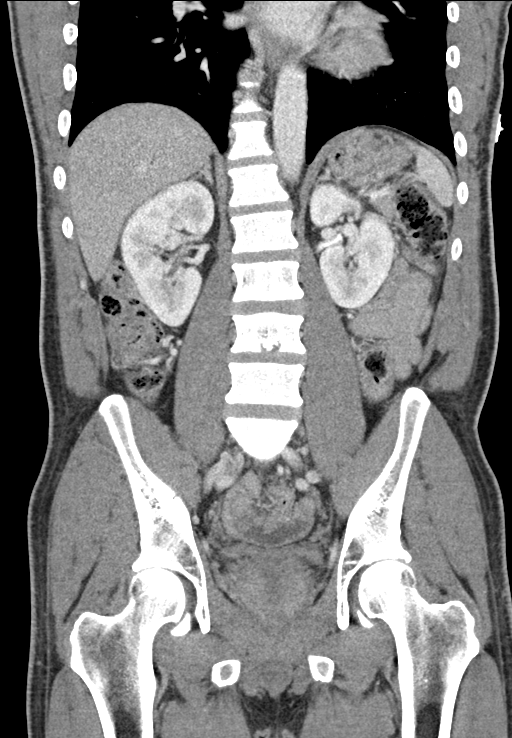

[16 of 46 positions shown; findings below may reference images not displayed]

FINDINGS: LOWER CHEST: Normal.

HEPATOBILIARY: Normal hepatic contours. No intra- or extrahepatic
biliary dilatation. The gallbladder is normal.

PANCREAS: Normal pancreas. No ductal dilatation or peripancreatic
fluid collection.

SPLEEN: Normal.

ADRENALS/URINARY TRACT: The adrenal glands are normal. No
hydronephrosis, nephroureterolithiasis or solid renal mass. The
urinary bladder is normal for degree of distention

STOMACH/BOWEL: There is no hiatal hernia. Normal duodenal course and
caliber. No small bowel dilatation or inflammation. No focal colonic
abnormality. Normal appendix.

VASCULAR/LYMPHATIC: Normal course and caliber of the major abdominal
vessels. No abdominal or pelvic lymphadenopathy.

REPRODUCTIVE: Normal prostate size with symmetric seminal vesicles.

MUSCULOSKELETAL. No bony spinal canal stenosis or focal osseous
abnormality.

OTHER: None.
IMPRESSION: No acute abnormality of the abdomen or pelvis.

## 2022-01-18 ENCOUNTER — Ambulatory Visit: Payer: Self-pay

## 2022-01-20 ENCOUNTER — Other Ambulatory Visit: Payer: Self-pay

## 2022-01-20 ENCOUNTER — Encounter (HOSPITAL_COMMUNITY): Payer: Self-pay | Admitting: Emergency Medicine

## 2022-01-20 ENCOUNTER — Emergency Department (HOSPITAL_COMMUNITY)
Admission: EM | Admit: 2022-01-20 | Discharge: 2022-01-20 | Disposition: A | Discharge status: Home / Self Care | Payer: No Typology Code available for payment source | Attending: Emergency Medicine | Admitting: Emergency Medicine

## 2022-01-20 DIAGNOSIS — D72829 Elevated white blood cell count, unspecified: Secondary | ICD-10-CM | POA: Insufficient documentation

## 2022-01-20 DIAGNOSIS — E860 Dehydration: Secondary | ICD-10-CM | POA: Insufficient documentation

## 2022-01-20 DIAGNOSIS — W1839XA Other fall on same level, initial encounter: Secondary | ICD-10-CM | POA: Insufficient documentation

## 2022-01-20 DIAGNOSIS — E11649 Type 2 diabetes mellitus with hypoglycemia without coma: Secondary | ICD-10-CM | POA: Insufficient documentation

## 2022-01-20 DIAGNOSIS — Z794 Long term (current) use of insulin: Secondary | ICD-10-CM | POA: Insufficient documentation

## 2022-01-20 DIAGNOSIS — Z79899 Other long term (current) drug therapy: Secondary | ICD-10-CM | POA: Insufficient documentation

## 2022-01-20 DIAGNOSIS — E162 Hypoglycemia, unspecified: Secondary | ICD-10-CM

## 2022-01-20 LAB — CBC WITH DIFFERENTIAL/PLATELET
Abs Immature Granulocytes: 0.03 10*3/uL (ref 0.00–0.07)
Basophils Absolute: 0 10*3/uL (ref 0.0–0.1)
Basophils Relative: 0 %
Eosinophils Absolute: 0.1 10*3/uL (ref 0.0–0.5)
Eosinophils Relative: 1 %
HCT: 42 % (ref 39.0–52.0)
Hemoglobin: 14.8 g/dL (ref 13.0–17.0)
Immature Granulocytes: 0 %
Lymphocytes Relative: 15 %
Lymphs Abs: 1.6 10*3/uL (ref 0.7–4.0)
MCH: 32.6 pg (ref 26.0–34.0)
MCHC: 35.2 g/dL (ref 30.0–36.0)
MCV: 92.5 fL (ref 80.0–100.0)
Monocytes Absolute: 0.8 10*3/uL (ref 0.1–1.0)
Monocytes Relative: 7 %
Neutro Abs: 8.4 10*3/uL — ABNORMAL HIGH (ref 1.7–7.7)
Neutrophils Relative %: 77 %
Platelets: 328 10*3/uL (ref 150–400)
RBC: 4.54 MIL/uL (ref 4.22–5.81)
RDW: 13.5 % (ref 11.5–15.5)
WBC: 10.9 10*3/uL — ABNORMAL HIGH (ref 4.0–10.5)
nRBC: 0 % (ref 0.0–0.2)

## 2022-01-20 LAB — COMPREHENSIVE METABOLIC PANEL
ALT: 13 U/L (ref 0–44)
AST: 20 U/L (ref 15–41)
Albumin: 4.4 g/dL (ref 3.5–5.0)
Alkaline Phosphatase: 51 U/L (ref 38–126)
Anion gap: 11 (ref 5–15)
BUN: 21 mg/dL — ABNORMAL HIGH (ref 6–20)
CO2: 24 mmol/L (ref 22–32)
Calcium: 10.1 mg/dL (ref 8.9–10.3)
Chloride: 105 mmol/L (ref 98–111)
Creatinine, Ser: 1.65 mg/dL — ABNORMAL HIGH (ref 0.61–1.24)
GFR, Estimated: 55 mL/min — ABNORMAL LOW (ref >60)
Glucose, Bld: 93 mg/dL (ref 70–99)
Potassium: 4.2 mmol/L (ref 3.5–5.1)
Sodium: 140 mmol/L (ref 135–145)
Total Bilirubin: 0.1 mg/dL — ABNORMAL LOW (ref 0.3–1.2)
Total Protein: 7.6 g/dL (ref 6.5–8.1)

## 2022-01-20 LAB — CBG MONITORING, ED
Glucose-Capillary: 122 mg/dL — ABNORMAL HIGH (ref 70–99)
Glucose-Capillary: 73 mg/dL (ref 70–99)
Glucose-Capillary: 56 mg/dL — ABNORMAL LOW (ref 70–99)
Glucose-Capillary: 58 mg/dL — ABNORMAL LOW (ref 70–99)
Glucose-Capillary: 148 mg/dL — ABNORMAL HIGH (ref 70–99)
Glucose-Capillary: 144 mg/dL — ABNORMAL HIGH (ref 70–99)

## 2022-01-20 LAB — ETHANOL: Alcohol, Ethyl (B): <10 mg/dL (ref <10)

## 2022-01-20 LAB — ACETAMINOPHEN LEVEL: Acetaminophen (Tylenol), Serum: <10 ug/mL — ABNORMAL LOW (ref 10–30)

## 2022-01-20 LAB — SALICYLATE LEVEL: Salicylate Lvl: <7 mg/dL — ABNORMAL LOW (ref 7.0–30.0)

## 2022-01-20 MED ORDER — ONDANSETRON HCL 4 MG/2ML IJ SOLN
4.0000 mg | Freq: Once | INTRAMUSCULAR | Status: AC
  Administered 2022-01-20: 4 mg via INTRAVENOUS
  Filled 2022-01-20: qty 2

## 2022-01-20 MED ORDER — SODIUM CHLORIDE 0.9 % IV BOLUS (SEPSIS)
1000.0000 mL | Freq: Once | INTRAVENOUS | Status: AC
  Administered 2022-01-20: 1000 mL via INTRAVENOUS

## 2022-01-20 NOTE — ED Notes (Signed)
Patient refused rectal temp.  Patient educated on need d/t extended hypoglycemia and patient educated on the dangers of low core temperature.  Patient verbalized understanding and patient continued to refuse.  MD Wickline made aware.

## 2022-01-20 NOTE — ED Triage Notes (Signed)
Patient BIB [facility] c/o hypoglycemia.  EMS reports patient was unresponsive upon their arrival with a CBG of 30.  They gave 1.5 amps of D10 and report his last sugar was 130.  Patient is aox4 but lethargic upon arrival.  18 G L AC

## 2022-01-20 NOTE — ED Notes (Signed)
Patient provided with more warm blankets.

## 2022-01-20 NOTE — ED Notes (Signed)
Patient alert and oriented x 4 upon assessment.  Patient denies pain.  Patient is shivering with goose bumps upon assessment. Patient's only complaint is feeling cold.

## 2022-01-20 NOTE — ED Provider Notes (Signed)
Monitor for stabilization of blood sugar after oral intake.  Assess for discharge. Physical Exam  BP (!) 141/100   Pulse 70   Temp (!) 97.5 F (36.4 C) (Oral)   Resp 17   Ht 6' (1.829 m)   Wt 90 kg   SpO2 100%   BMI 26.91 kg/m   Physical Exam  Procedures  Procedures  ED Course / MDM   Clinical Course as of 01/20/22 1013  Thu Jan 20, 2022  0606 WBC(!): 10.9 Mild leukocytosis [DW]  0606 Patient presents after hypoglycemia.  It is now improving.  Patient is shivering but refuses core temperature, warm blankets have been provided.  Patient specifically denies any suicide attempt or SI.  However he has had previous suicidal ideation and has attempted overdosing with insulin previously.  He will be monitored closely [DW]  0635 Creatinine(!): 1.65 Renal insufficiency [DW]  9381 Patient is taking p.o. fluids.  Will continue to monitor [DW]  [phone] Signed out to Dr. Clarice Pole with plan to monitor glucose and if continues to improve he may be amenable for discharge [DW]    Clinical Course User Index [DW] Zadie Rhine, MD   Medical Decision Making Amount and/or Complexity of Data Reviewed Labs: ordered. Decision-making details documented in ED Course.  Risk Prescription drug management.   Patient reassessed.  He is alert with clear mental status.  Vital signs are stable.  He has been eating without difficulty.  Blood sugars checked sequentially for past 2 hours staying consistently in the 140s.  At this time patient is stable for discharge.  Careful return precautions and instructions provided with discharge for not taking insulin if blood sugars are remaining low normal.       Arby Barrette, MD 01/20/22 1015

## 2022-01-20 NOTE — ED Notes (Signed)
Patient given ginger ale d/t c/o not being able to eat because of nausea.  Anti-emetic given per order.

## 2022-01-20 NOTE — ED Notes (Signed)
DC instructions reviewed with pt. Pt verbalized understanding.  PT DC.  

## 2022-01-20 NOTE — ED Provider Notes (Signed)
[facility] EMERGENCY DEPARTMENT Provider Note   CSN: [id number] Arrival date & time: 01/20/22  0524     History  Chief Complaint  Patient presents with   Hypoglycemia    [name] is a 36 y.o. male.  The history is provided by the patient.  Patient with history of HIV and diabetes presents with hypoglycemia.  Patient reports he woke up and was not feeling well and thought his glucose was low.  He reports he felt weak and fell to the ground but denies any traumatic injury.  EMS was called and they found patient to have a glucose of 30.  He has received multiple treatments with dextrose and now his glucose is above 100.  Patient reports his last use of insulin was yesterday.  He reports he was in his usual state of health.  He does not take any oral diabetic medications He uses basaglar Claiborne Rigg and humalog  Patient denies any attempted overdose.     Home Medications Prior to Admission medications   Medication Sig Start Date End Date Taking? Authorizing Provider  amLODipine (NORVASC) 10 MG tablet Take 1 tablet (10 mg total) by mouth daily. For high blood pressure 10/18/21 01/16/22  Elsie Stain, MD  blood glucose meter kit and supplies Dispense based on patient and insurance preference. Use up to four times daily as directed: For blood sugar monitoring 10/08/20   Lindell Spar I, NP  citalopram (CELEXA) 20 MG tablet Take 1 tablet (20 mg total) by mouth daily. 10/18/21   Elsie Stain, MD  elvitegravir-cobicistat-emtricitabine-tenofovir (GENVOYA) 150-150-200-10 MG TABS tablet Take 1 tablet by mouth daily with breakfast. 06/25/21   Kuppelweiser, Cassie L, RPH-CPP  gabapentin (NEURONTIN) 100 MG capsule Take 1 capsule (100 mg total) by mouth 3 (three) times daily. For neuropathy 11/03/20   Camillia Herter, NP  glucose blood (TRUE METRIX BLOOD GLUCOSE TEST) test strip use up to 4 times daily as directed for blood sugar monitoring 11/18/21   Elsie Stain, MD  hydrALAZINE (APRESOLINE) 25 MG tablet Take 1 tablet (25 mg total) by mouth every 8 (eight) hours. For hypertension 11/19/21   Elsie Stain, MD  Insulin Glargine [facility]) 100 UNIT/ML Inject 15 Units into the skin daily. 10/18/21   Elsie Stain, MD  insulin lispro (HUMALOG) 100 UNIT/ML KwikPen Inject 5 Units into the skin 2 (two) times daily before lunch and supper. Hold if blood sugar less than 150 10/18/21   Elsie Stain, MD  Insulin Pen Needle 31G X 5 MM MISC Use with insulin pen 10/18/21   Elsie Stain, MD  sodium fluoride (PREVIDENT 5000 PLUS) 1.1 % CREA dental cream Brush on teeth with a toothbrush after evening mouth care. Spit out excess and do not rinse 01/28/21     TRUEplus Lancets 28G MISC use up to 4 times daily as directed for blood sugar monitoring 10/18/21   Elsie Stain, MD  valsartan-hydrochlorothiazide (DIOVAN-HCT) 320-25 MG tablet Take 1 tablet by mouth daily. 10/18/21   Elsie Stain, MD  pravastatin (PRAVACHOL) 20 MG tablet Take 1 tablet (20 mg total) by mouth daily. Patient not taking: Reported on 03/31/2019 03/08/19 07/08/19  Gildardo Pounds, NP      Allergies    Shellfish allergy and Aspirin    Review of Systems   Review of Systems  Constitutional:  Positive for fatigue.  Neurological:  Positive for weakness.    Physical Exam Updated Vital Signs BP Marland Kitchen)  141/100   Pulse 70   Temp (!) 97.5 F (36.4 C) (Oral)   Resp 17   Ht 1.829 m (6')   Wt 90 kg   SpO2 100%   BMI 26.91 kg/m  Physical Exam CONSTITUTIONAL: Well developed/well nourished HEAD: Normocephalic/atraumatic, no visible trauma EYES: EOMI/PERRL ENMT: Mucous membranes moist NECK: supple no meningeal signs SPINE/BACK:entire spine nontender No bruising/crepitance/stepoffs noted to spine CV: S1/S2 noted, no murmurs/rubs/gallops noted LUNGS: Lungs are clear to auscultation bilaterally, no apparent distress ABDOMEN: soft, nontender NEURO: Pt is awake/alert/appropriate,  moves all extremitiesx4.  No facial droop.   EXTREMITIES: pulses normal/equal, full ROM, no deformities SKIN: warm, color normal PSYCH: no abnormalities of mood noted, alert and oriented to situation  ED Results / Procedures / Treatments   Labs (all labs ordered are listed, but only abnormal results are displayed) Labs Reviewed  COMPREHENSIVE METABOLIC PANEL - Abnormal; Notable for the following components:      Result Value   BUN 21 (*)    Creatinine, Ser 1.65 (*)    Total Bilirubin 0.1 (*)    GFR, Estimated 55 (*)    All other components within normal limits  SALICYLATE LEVEL - Abnormal; Notable for the following components:   Salicylate Lvl <4.2 (*)    All other components within normal limits  ACETAMINOPHEN LEVEL - Abnormal; Notable for the following components:   Acetaminophen (Tylenol), Serum <10 (*)    All other components within normal limits  CBC WITH DIFFERENTIAL/PLATELET - Abnormal; Notable for the following components:   WBC 10.9 (*)    Neutro Abs 8.4 (*)    All other components within normal limits  CBG MONITORING, ED - Abnormal; Notable for the following components:   Glucose-Capillary 122 (*)    All other components within normal limits  CBG MONITORING, ED - Abnormal; Notable for the following components:   Glucose-Capillary 58 (*)    All other components within normal limits  CBG MONITORING, ED - Abnormal; Notable for the following components:   Glucose-Capillary 56 (*)    All other components within normal limits  ETHANOL  RAPID URINE DRUG SCREEN, HOSP PERFORMED  CBG MONITORING, ED  CBG MONITORING, ED    EKG EKG Interpretation  Date/Time:  Thursday January 20 2022 05:33:13 EST Ventricular Rate:  79 PR Interval:  131 QRS Duration: 91 QT Interval:  390 QTC Calculation: 448 R Axis:   67 Text Interpretation: Sinus rhythm ST elev, probable normal early repol pattern Interpretation limited secondary to artifact No significant change since last tracing  Confirmed by Ripley Fraise [phone]) on 01/20/2022 6:01:11 AM  Radiology No results found.  Procedures Procedures    Medications Ordered in ED Medications  ondansetron (ZOFRAN) injection 4 mg (4 mg Intravenous Given 01/20/22 0637)  sodium chloride 0.9 % bolus 1,000 mL (1,000 mLs Intravenous New Bag/Given 01/20/22 4431)    ED Course/ Medical Decision Making/ A&P Clinical Course as of 01/20/22 0730  Thu Jan 20, 2022  0606 WBC(!): 10.9 Mild leukocytosis [DW]  0606 Patient presents after hypoglycemia.  It is now improving.  Patient is shivering but refuses core temperature, warm blankets have been provided.  Patient specifically denies any suicide attempt or SI.  However he has had previous suicidal ideation and has attempted overdosing with insulin previously.  He will be monitored closely [DW]  0635 Creatinine(!): 1.65 Renal insufficiency [DW]  5400 Patient is taking p.o. fluids.  Will continue to monitor [DW]  [phone] Signed out to Dr. Vallery Ridge with plan to  monitor glucose and if continues to improve he may be amenable for discharge [DW]    Clinical Course User Index [DW] Ripley Fraise, MD                           Medical Decision Making Amount and/or Complexity of Data Reviewed Labs: ordered. Decision-making details documented in ED Course.  Risk Prescription drug management.   This patient presents to the ED for concern of altered mental status, this involves an extensive number of treatment options, and is a complaint that carries with it a high risk of complications and morbidity.  The differential diagnosis includes but is not limited to CVA, intracranial hemorrhage, acute coronary syndrome, renal failure, urinary tract infection, electrolyte disturbance, pneumonia, hypoglycemia    Comorbidities that complicate the patient evaluation: Patient's presentation is complicated by their history of diabetes and HIV  Social Determinants of Health: Patient's  tobacco use    increases the complexity of managing their presentation  Additional history obtained: Records reviewed previous admission documents  Lab Tests: I Ordered, and personally interpreted labs.  The pertinent results include: Hypoglycemia, renal insufficiency   Cardiac Monitoring: The patient was maintained on a cardiac monitor.  I personally viewed and interpreted the cardiac monitor which showed an underlying rhythm of:  sinus rhythm  Medicines ordered and prescription drug management: I ordered medication including IV fluids for dehydration Reevaluation of the patient after these medicines showed that the patient    stayed the same  Reevaluation: After the interventions noted above, I reevaluated the patient and found that they have :improved  Complexity of problems addressed: Patient's presentation is most consistent with  acute presentation with potential threat to life or bodily function          Final Clinical Impression(s) / ED Diagnoses Final diagnoses:  Hypoglycemia  Dehydration    Rx / DC Orders ED Discharge Orders     None         Ripley Fraise, MD 01/20/22 0730

## 2022-01-20 NOTE — Discharge Instructions (Signed)
1.  Continue to closely monitor your blood sugars at home.  Keep a log of your blood sugars and do not take your insulin if your blood sugars are persistently in normal to low range.  Call your doctor on the next available business day to schedule a recheck. 2.  Return to the emergency department immediately if your blood sugars are remaining low, less than 60s despite eating and skipping insulin doses.  Return immediately if you have new worsening or concerning symptoms.

## 2022-01-20 NOTE — ED Notes (Addendum)
Provider made aware of CBG.  Patient given graham crackers and orange juice and is agreeing to eat them.

## 2022-01-24 ENCOUNTER — Other Ambulatory Visit: Payer: Self-pay

## 2022-01-25 ENCOUNTER — Ambulatory Visit: Payer: No Typology Code available for payment source | Admitting: Internal Medicine

## 2022-02-03 ENCOUNTER — Encounter: Payer: Self-pay | Admitting: Internal Medicine

## 2022-02-03 ENCOUNTER — Other Ambulatory Visit: Payer: Self-pay

## 2022-02-03 ENCOUNTER — Ambulatory Visit (INDEPENDENT_AMBULATORY_CARE_PROVIDER_SITE_OTHER): Payer: No Typology Code available for payment source | Admitting: Internal Medicine

## 2022-02-03 VITALS — BP 133/81 | HR 88 | Temp 99.1°F | Wt 190.0 lb

## 2022-02-03 DIAGNOSIS — Z21 Asymptomatic human immunodeficiency virus [HIV] infection status: Secondary | ICD-10-CM

## 2022-02-03 DIAGNOSIS — A539 Syphilis, unspecified: Secondary | ICD-10-CM

## 2022-02-03 DIAGNOSIS — Z113 Encounter for screening for infections with a predominantly sexual mode of transmission: Secondary | ICD-10-CM | POA: Insufficient documentation

## 2022-02-03 DIAGNOSIS — E1021 Type 1 diabetes mellitus with diabetic nephropathy: Secondary | ICD-10-CM

## 2022-02-03 DIAGNOSIS — Z794 Long term (current) use of insulin: Secondary | ICD-10-CM

## 2022-02-03 DIAGNOSIS — F332 Major depressive disorder, recurrent severe without psychotic features: Secondary | ICD-10-CM

## 2022-02-03 MED ORDER — GENVOYA 150-150-200-10 MG PO TABS: 1.0000 | ORAL_TABLET | Freq: Every day | ORAL | 5 refills | Status: DC

## 2022-02-03 NOTE — Assessment & Plan Note (Signed)
Has upcoming appointment with PCP to help manage.

## 2022-02-03 NOTE — Assessment & Plan Note (Signed)
Continues to be a longstanding issue for him.  Has been recommended to follow up with behavioral health and counseling but declines today.

## 2022-02-03 NOTE — Assessment & Plan Note (Signed)
Screening offered today and he is agreeable.  Will check GC/CT x 3 today.

## 2022-02-03 NOTE — Assessment & Plan Note (Signed)
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    Follow up RPR monitoring showed increase again to 1:32 in August and September 2022.  This was treated with Bicillin at that time.  However, he has not had RPR repeated as he left without getting labs done in June 2023.  Will repeat RPR again today.

## 2022-02-03 NOTE — Assessment & Plan Note (Signed)
Patient here today for HIV follow up.  I last saw him in June 2023 at which time he reported adherence to [facility].  He did not get labs done that day so his last viral load is from Sept 2022 when he was noted to be undetectable. He reports ongoing adherence with [facility] so will send in refills today and follow up in 6 months.  Will also get labs at today.

## 2022-02-03 NOTE — Progress Notes (Signed)
[facility] for Infectious Disease   CHIEF COMPLAINT    HIV follow up.    SUBJECTIVE:    [name] is a 36 y.o. male with PMHx as below who presents to the clinic for HIV follow up.   Please see A&P for the details of today's visit and status of the patient's medical problems.   Patient's Medications  New Prescriptions   No medications on file  Previous Medications   AMLODIPINE (NORVASC) 10 MG TABLET    Take 1 tablet (10 mg total) by mouth daily. For high blood pressure   BLOOD GLUCOSE METER KIT AND SUPPLIES    Dispense based on patient and insurance preference. Use up to four times daily as directed: For blood sugar monitoring   CITALOPRAM (CELEXA) 20 MG TABLET    Take 1 tablet (20 mg total) by mouth daily.   GABAPENTIN (NEURONTIN) 100 MG CAPSULE    Take 1 capsule (100 mg total) by mouth 3 (three) times daily. For neuropathy   GLUCOSE BLOOD (TRUE METRIX BLOOD GLUCOSE TEST) TEST STRIP    use up to 4 times daily as directed for blood sugar monitoring   HYDRALAZINE (APRESOLINE) 25 MG TABLET    Take 1 tablet (25 mg total) by mouth every 8 (eight) hours. For hypertension   INSULIN GLARGINE (BASAGLAR KWIKPEN) 100 UNIT/ML    Inject 15 Units into the skin daily.   INSULIN LISPRO (HUMALOG) 100 UNIT/ML KWIKPEN    Inject 5 Units into the skin 2 (two) times daily before lunch and supper. Hold if blood sugar less than 150   INSULIN PEN NEEDLE 31G X 5 MM MISC    Use with insulin pen   SODIUM FLUORIDE (PREVIDENT 5000 PLUS) 1.1 % CREA DENTAL CREAM    Brush on teeth with a toothbrush after evening mouth care. Spit out excess and do not rinse   TRUEPLUS LANCETS 28G MISC    use up to 4 times daily as directed for blood sugar monitoring   VALSARTAN-HYDROCHLOROTHIAZIDE (DIOVAN-HCT) 320-25 MG TABLET    Take 1 tablet by mouth daily.  Modified Medications   Modified Medication Previous Medication   ELVITEGRAVIR-COBICISTAT-EMTRICITABINE-TENOFOVIR (GENVOYA) 150-150-200-10 MG TABS  TABLET elvitegravir-cobicistat-emtricitabine-tenofovir (GENVOYA) 150-150-200-10 MG TABS tablet      Take 1 tablet by mouth daily with breakfast.    Take 1 tablet by mouth daily with breakfast.  Discontinued Medications   No medications on file      Past Medical History:  Diagnosis Date   Anxiety    Asthma    Depression    Headache    "only w/stress" (11/01/2017)   HIV (human immunodeficiency virus infection) ([place])    Migraine    "a few/month" (11/01/2017)   Refusal of blood transfusions as patient is Jehovah's Witness    Seizure ([place])    "only w/my low blood sugars"  (11/01/2017)   Type I diabetes mellitus ([place])     Social History   Tobacco Use   Smoking status: Every Day    Types: Cigars    Start date: 09/29/2015   Smokeless tobacco: Never   Tobacco comments:    11/01/2017  "2 cigarettes in ~ 6 month "  Vaping Use   Vaping Use: Some days  Substance Use Topics   Alcohol use: Yes    Alcohol/week: 7.0 standard drinks of alcohol    Types: 7 Cans of beer per week   Drug use: Yes    Frequency: 2.0  times per week    Types: Marijuana    Comment: 11/01/2017  "weekly"    Family History  Problem Relation Age of Onset   Thyroid disease Mother    Diabetes Mother    Breast cancer Maternal Grandmother    Diabetes Maternal Grandmother     Allergies  Allergen Reactions   Shellfish Allergy Anaphylaxis and Hives   Aspirin Other (See Comments)    Unknown reaction    Review of Systems  Constitutional: Negative.   Respiratory: Negative.    Cardiovascular: Negative.   Psychiatric/Behavioral:  Positive for depression.      OBJECTIVE:    Vitals:   02/03/22 0955  BP: 133/81  Pulse: 88  Temp: 99.1 F (37.3 C)  TempSrc: Oral  SpO2: 100%  Weight: 190 lb (86.2 kg)     Body mass index is 25.77 kg/m.  Physical Exam Constitutional:      Appearance: Normal appearance.  HENT:     Head: Normocephalic and atraumatic.  Eyes:     Extraocular Movements: Extraocular movements  intact.     Conjunctiva/sclera: Conjunctivae normal.  Pulmonary:     Effort: Pulmonary effort is normal. No respiratory distress.  Abdominal:     General: There is no distension.     Palpations: Abdomen is soft.  Neurological:     General: No focal deficit present.     Mental Status: He is alert and oriented to person, place, and time.  Psychiatric:     Comments: He is withdrawn, affect flat, and avoiding eye contact.      Labs and Microbiology:    Latest Ref Rng & Units 01/20/2022    5:40 AM 10/21/2020   11:42 AM 10/06/2020    6:27 AM  CMP  Glucose 70 - 99 mg/dL 93  285  303   BUN 6 - 20 mg/dL _0 Creatinine 0.61 - 1.24 mg/dL 1.65  1.27  1.34   Sodium 135 - 145 mmol/L 140  136  133   Potassium 3.5 - 5.1 mmol/L 4.2  4.4  4.2   Chloride 98 - 111 mmol/L 105  104  99   CO2 22 - 32 mmol/L _1 Calcium 8.9 - 10.3 mg/dL 10.1  9.0  9.8   Total Protein 6.5 - 8.1 g/dL 7.6  6.5    Total Bilirubin 0.3 - 1.2 mg/dL 0.1  0.4    Alkaline Phos 38 - 126 U/L 51     AST 15 - 41 U/L 20  14    ALT 0 - 44 U/L 13  15        Latest Ref Rng & Units 01/20/2022    5:40 AM 10/21/2020   11:42 AM 10/05/2020   10:52 PM  CBC  WBC 4.0 - 10.5 K/uL 10.9  6.1    Hemoglobin 13.0 - 17.0 g/dL 14.8  13.8  17.0   Hematocrit 39.0 - 52.0 % 42.0  40.4  50.0   Platelets 150 - 400 K/uL 328  305       Lab Results  Component Value Date   HIV1RNAQUANT Not Detected 10/21/2020   HIV1RNAQUANT <20 03/10/2020   HIV1RNAQUANT <20 11/22/2019   CD4TABS 535 10/21/2020   CD4TABS 582 03/10/2020   CD4TABS 641 11/22/2019    RPR and STI: Lab Results  Component Value Date   LABRPR REACTIVE (A) 11/25/2020   LABRPR REACTIVE (A) 10/21/2020   LABRPR REACTIVE (A) 03/10/2020  LABRPR REACTIVE (A) 11/22/2019   LABRPR REACTIVE (A) 01/01/2019   RPRTITER 1:32 (H) 11/25/2020   RPRTITER 1:32 (H) 10/21/2020   RPRTITER 1:16 (H) 03/10/2020   RPRTITER 1:8 (H) 11/22/2019   RPRTITER 1:32 (H) 01/01/2019       Hepatitis B: Lab Results  Component Value Date   HEPBSAB POS (A) 10/21/2014   HEPBSAG NEGATIVE 10/21/2014   HEPBCAB NON REACTIVE 10/21/2014   Hepatitis C: No results found for: "HEPCAB", "HCVRNAPCRQN" Hepatitis A: Lab Results  Component Value Date   HAV NON-REACTIVE 12/06/2019   Lipids: Lab Results  Component Value Date   CHOL 154 10/21/2020   TRIG 102 10/21/2020   HDL 71 10/21/2020   CHOLHDL 2.2 10/21/2020   VLDL 10 09/24/2020   LDLCALC 64 10/21/2020     ASSESSMENT & PLAN:    HIV infection [facility]) Patient here today for HIV follow up.  I last saw him in June 2023 at which time he reported adherence to [facility].  He did not get labs done that day so his last viral load is from Sept 2022 when he was noted to be undetectable. He reports ongoing adherence with [facility] so will send in refills today and follow up in 6 months.  Will also get labs at today.  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    Follow up RPR monitoring showed increase again to 1:32 in August and September 2022.  This was treated with Bicillin at that time.  However, he has not had RPR repeated as he left without getting labs done in June 2023.  Will repeat RPR again today.   Routine screening for STI (sexually transmitted infection) Screening offered today and he is agreeable.  Will check GC/CT x 3 today.  MDD (major depressive disorder), recurrent episode, severe ([place]) Continues to be a longstanding issue for him.  Has been recommended to follow up with behavioral health and counseling but declines today.    Diabetes mellitus type 1 ([place]) Has upcoming appointment with PCP to help manage.    Orders Placed This Encounter  Procedures   HIV-1 RNA quant-no reflex-bld   RPR   T-helper cell (CD4)- ([facility] clinic only)       Raynelle High[facility] for Infectious [facility] 02/03/2022, 10:39 AM

## 2022-02-04 LAB — URINE CYTOLOGY ANCILLARY ONLY
Chlamydia: NEGATIVE
Comment: NEGATIVE
Comment: NORMAL
Neisseria Gonorrhea: NEGATIVE

## 2022-02-04 LAB — T-HELPER CELL (CD4) - (RCID CLINIC ONLY)
CD4 % Helper T Cell: 38 % (ref 33–65)
CD4 T Cell Abs: 520 /uL (ref 400–1790)

## 2022-02-04 LAB — CYTOLOGY, (ORAL, ANAL, URETHRAL) ANCILLARY ONLY
Chlamydia: NEGATIVE
Chlamydia: NEGATIVE
Comment: NEGATIVE
Comment: NEGATIVE
Comment: NORMAL
Comment: NORMAL
Neisseria Gonorrhea: NEGATIVE
Neisseria Gonorrhea: NEGATIVE

## 2022-02-06 LAB — HIV-1 RNA QUANT-NO REFLEX-BLD
HIV 1 RNA Quant: NOT DETECTED Copies/mL
HIV-1 RNA Quant, Log: NOT DETECTED Log cps/mL

## 2022-02-06 LAB — RPR TITER: RPR Titer: 1:16 {titer} — ABNORMAL HIGH

## 2022-02-06 LAB — RPR: RPR Ser Ql: REACTIVE — AB

## 2022-02-06 LAB — FLUORESCENT TREPONEMAL AB(FTA)-IGG-BLD: Fluorescent Treponemal ABS: REACTIVE — AB

## 2022-02-10 ENCOUNTER — Encounter: Payer: Self-pay | Admitting: Critical Care Medicine

## 2022-02-10 ENCOUNTER — Other Ambulatory Visit: Payer: Self-pay

## 2022-02-10 ENCOUNTER — Other Ambulatory Visit: Payer: Self-pay | Admitting: Pharmacist

## 2022-02-10 ENCOUNTER — Telehealth: Payer: Self-pay | Admitting: Critical Care Medicine

## 2022-02-10 ENCOUNTER — Ambulatory Visit: Payer: Self-pay | Attending: Critical Care Medicine | Admitting: Critical Care Medicine

## 2022-02-10 VITALS — BP 122/71 | HR 71 | Wt 196.2 lb

## 2022-02-10 DIAGNOSIS — E1021 Type 1 diabetes mellitus with diabetic nephropathy: Secondary | ICD-10-CM

## 2022-02-10 DIAGNOSIS — R45851 Suicidal ideations: Secondary | ICD-10-CM

## 2022-02-10 DIAGNOSIS — A539 Syphilis, unspecified: Secondary | ICD-10-CM

## 2022-02-10 DIAGNOSIS — F332 Major depressive disorder, recurrent severe without psychotic features: Secondary | ICD-10-CM

## 2022-02-10 DIAGNOSIS — I10 Essential (primary) hypertension: Secondary | ICD-10-CM

## 2022-02-10 LAB — POCT GLYCOSYLATED HEMOGLOBIN (HGB A1C): HbA1c, POC (controlled diabetic range): 9.8 % — AB (ref 0.0–7.0)

## 2022-02-10 LAB — GLUCOSE, POCT (MANUAL RESULT ENTRY): POC Glucose: 378 mg/dl — AB (ref 70–99)

## 2022-02-10 MED ORDER — AMLODIPINE BESYLATE 10 MG PO TABS
10.0000 mg | ORAL_TABLET | Freq: Every day | ORAL | 0 refills | Status: DC
  Filled 2022-02-10: qty 90, 90d supply, fill #0

## 2022-02-10 MED ORDER — SERTRALINE HCL 100 MG PO TABS
100.0000 mg | ORAL_TABLET | Freq: Every day | ORAL | 3 refills | Status: DC
  Filled 2022-02-10: qty 60, 60d supply, fill #0
  Filled 2022-06-18: qty 30, 30d supply, fill #1

## 2022-02-10 MED ORDER — INSULIN PEN NEEDLE 31G X 5 MM MISC
1 refills | Status: DC
  Filled 2022-02-10: qty 100, 33d supply, fill #0

## 2022-02-10 MED ORDER — INSULIN LISPRO (1 UNIT DIAL) 100 UNIT/ML (KWIKPEN)
10.0000 [IU] | PEN_INJECTOR | Freq: Two times a day (BID) | SUBCUTANEOUS | 2 refills | Status: DC
  Filled 2022-02-10 – 2022-03-01 (×2): qty 6, 30d supply, fill #0
  Filled 2022-04-27: qty 6, 30d supply, fill #1

## 2022-02-10 MED ORDER — VALSARTAN-HYDROCHLOROTHIAZIDE 320-25 MG PO TABS
1.0000 | ORAL_TABLET | Freq: Every day | ORAL | 3 refills | Status: DC
  Filled 2022-02-10: qty 30, 30d supply, fill #0
  Filled 2022-08-03: qty 30, 30d supply, fill #1

## 2022-02-10 MED ORDER — BASAGLAR KWIKPEN 100 UNIT/ML ~~LOC~~ SOPN
22.0000 [IU] | PEN_INJECTOR | Freq: Every day | SUBCUTANEOUS | 2 refills | Status: DC
  Filled 2022-02-10: qty 15, 68d supply, fill #0
  Filled 2022-04-27 (×2): qty 15, 68d supply, fill #1

## 2022-02-10 MED ORDER — HYDRALAZINE HCL 25 MG PO TABS
25.0000 mg | ORAL_TABLET | Freq: Three times a day (TID) | ORAL | 2 refills | Status: DC
  Filled 2022-02-10: qty 90, 30d supply, fill #0
  Filled 2022-06-18: qty 90, 30d supply, fill #1

## 2022-02-10 NOTE — Patient Instructions (Addendum)
Refills on all medications sent to pharmacy downstairs  Labs today : urine for protein, cholesterol panel  Start sertraline 100mg daily, clinical social worker Ms Hall will call you to follow up on your mental health  New insulin dosing per our pharmacist Luke you met will be:  Basaglar 22 units daily , continue insulin lispro 10 units before meals  Return to Luke clinical pharmacist in :      4    weeks  Return Dr  2 months  Work on getting Medicaid   

## 2022-02-10 NOTE — Assessment & Plan Note (Signed)
Hypertension well controlled continue current medications refills given °

## 2022-02-10 NOTE — Assessment & Plan Note (Signed)
History of active syphilis in the past his syphilis titers were slightly increased I conferred with infectious disease I will see him again in June and hold off on Bicillin

## 2022-02-10 NOTE — Progress Notes (Signed)
Established patient Office Visit  Subjective    Patient ID: [name], male    DOB: 07/11/1985  Age: 36 y.o. MRN: [id number]  CC:  Chief Complaint  Patient presents with   Hypertension   Diabetes    HPI 10/18/21 [name] presents to establish care This is a former patient in our clinic last seen in 2021 by Ms. Fleming for type 1 diabetes and diabetic polyneuropathy.  The patient unfortunately is now homeless but comes in today to reestablish.  On arrival blood sugar 222 A1c 8.1 blood pressure 175/103.  The patient has been homeless now for several months.  He does have access to 70/30 insulin which she uses an insulin syringe for.  He has no other medications.  He does suffer from severe mental health issues with depression he feels like he can go no further and has been having active suicidal ideation.  Apparently he has been seen for this problem in the past.  He was on citalopram in the past but is off this medication.  The patient needs a complete health screening at this visit  Patient does have numbness in the feet.  Patient also is followed by the HIV clinic below is documentation for the last visit in June.  He has been compliant with his HIV medications.  His labs in August 2022 showed improvement in his numbers. The biggest issue continues to be depression and ongoing chronic suicidal ideation.  See assessment below note the patient does drink 2 beers daily.  He does not smoke cigarettes nor does he use other substances.  12/14 Patient returns in follow-up for type I diet [name] severe depression and hypertension.  The patient has not been taking insulin regularly and did not take any this morning and did not eat today comes in with a blood sugar 378 A1c of 9.8  Patient also has had significant depression with chronic suicidal ideation but no clear plan.  At the last visit we try to get him over to [facility] behavioral health with the police  escort but he left before the police arrived.  There is no record of any emergency room visits for suicidal attempts since that visit in August.  Patient does stay chronically depressed did not fill the Celexa be prescribed.  Blood pressure currently is controlled on amlodipine valsartan HCT and hydralazine.  Blood pressure on arrival 122/71  Outpatient Encounter Medications as of 02/10/2022  Medication Sig   blood glucose meter kit and supplies Dispense based on patient and insurance preference. Use up to four times daily as directed: For blood sugar monitoring   elvitegravir-cobicistat-emtricitabine-tenofovir (GENVOYA) 150-150-200-10 MG TABS tablet Take 1 tablet by mouth daily with breakfast.   glucose blood (TRUE METRIX BLOOD GLUCOSE TEST) test strip use up to 4 times daily as directed for blood sugar monitoring   sertraline (ZOLOFT) 100 MG tablet Take 1 tablet (100 mg total) by mouth daily.   [DISCONTINUED] amLODipine (NORVASC) 10 MG tablet Take 1 tablet (10 mg total) by mouth daily. For high blood pressure   [DISCONTINUED] hydrALAZINE (APRESOLINE) 25 MG tablet Take 1 tablet (25 mg total) by mouth every 8 (eight) hours. For hypertension   [DISCONTINUED] Insulin Glargine (BASAGLAR KWIKPEN) 100 UNIT/ML Inject 15 Units into the skin daily.   [DISCONTINUED] insulin lispro (HUMALOG) 100 UNIT/ML KwikPen Inject 5 Units into the skin 2 (two) times daily before lunch and supper. Hold if blood sugar less than 150   [DISCONTINUED] Insulin Pen Needle  31G X 5 MM MISC Use with insulin pen   [DISCONTINUED] valsartan-hydrochlorothiazide (DIOVAN-HCT) 320-25 MG tablet Take 1 tablet by mouth daily.   amLODipine (NORVASC) 10 MG tablet Take 1 tablet (10 mg total) by mouth daily. For high blood pressure   hydrALAZINE (APRESOLINE) 25 MG tablet Take 1 tablet (25 mg total) by mouth every 8 (eight) hours. For hypertension   Insulin Glargine (BASAGLAR KWIKPEN) 100 UNIT/ML Inject 22 Units into the skin daily.   insulin  lispro (HUMALOG) 100 UNIT/ML KwikPen Inject 10 Units into the skin 2 (two) times daily before lunch and supper. Hold if blood sugar less than 150   Insulin Pen Needle 31G X 5 MM MISC Use with insulin pen   sodium fluoride (PREVIDENT 5000 PLUS) 1.1 % CREA dental cream Brush on teeth with a toothbrush after evening mouth care. Spit out excess and do not rinse (Patient not taking: Reported on 02/10/2022)   valsartan-hydrochlorothiazide (DIOVAN-HCT) 320-25 MG tablet Take 1 tablet by mouth daily.   [DISCONTINUED] citalopram (CELEXA) 20 MG tablet Take 1 tablet (20 mg total) by mouth daily. (Patient not taking: Reported on 02/03/2022)   [DISCONTINUED] gabapentin (NEURONTIN) 100 MG capsule Take 1 capsule (100 mg total) by mouth 3 (three) times daily. For neuropathy (Patient not taking: Reported on 02/03/2022)   [DISCONTINUED] pravastatin (PRAVACHOL) 20 MG tablet Take 1 tablet (20 mg total) by mouth daily. (Patient not taking: Reported on 03/31/2019)   [DISCONTINUED] TRUEplus Lancets 28G MISC use up to 4 times daily as directed for blood sugar monitoring   No facility-administered encounter medications on file as of 02/10/2022.    Past Medical History:  Diagnosis Date   Anxiety    Asthma    Closed displaced comminuted fracture of shaft of right tibia 11/01/2017   Closed fracture of base of fifth metatarsal bone of right foot 11/13/2017   Closed fracture of medial malleolus of right ankle 10/31/2017   Depression    Headache    "only w/stress" (11/01/2017)   HIV (human immunodeficiency virus infection) ([place])    Migraine    "a few/month" (11/01/2017)   Refusal of blood transfusions as patient is Jehovah's Witness    Seizure ([place])    "only w/my low blood sugars"  (11/01/2017)   Type I diabetes mellitus ([place])     Past Surgical History:  Procedure Laterality Date   FRACTURE SURGERY     IM NAILING TIBIA Right 11/01/2017   INTRAMEDULLARY (IM) NAIL TIBIALRightGeneral   ORIF ANKLE FRACTURE Right 11/01/2017    ORIF ANKLE FRACTURE Right 11/01/2017   Procedure: OPEN REDUCTION INTERNAL FIXATION (ORIF) ANKLE FRACTURE;  Surgeon: Shona Needles, MD;  Location: [facility];  Service: Orthopedics;  Laterality: Right;   TIBIA IM NAIL INSERTION Right 11/01/2017   Procedure: INTRAMEDULLARY (IM) NAIL TIBIAL;  Surgeon: Shona Needles, MD;  Location: [facility];  Service: Orthopedics;  Laterality: Right;    Family History  Problem Relation Age of Onset   Thyroid disease Mother    Diabetes Mother    Breast cancer Maternal Grandmother    Diabetes Maternal Grandmother     Social History   Socioeconomic History   Marital status: Single    Spouse name: Not on file   Number of children: Not on file   Years of education: Not on file   Highest education level: Not on file  Occupational History   Not on file  Tobacco Use   Smoking status: Some Days    Types: Cigars  Start date: 09/29/2015   Smokeless tobacco: Never   Tobacco comments:    11/01/2017  "2 cigarettes in ~ 6 month "  Vaping Use   Vaping Use: Former  Substance and Sexual Activity   Alcohol use: Yes    Alcohol/week: 7.0 standard drinks of alcohol    Types: 7 Cans of beer per week   Drug use: Yes    Frequency: 2.0 times per week    Types: Marijuana    Comment: 11/01/2017  "weekly"   Sexual activity: Yes    Partners: Male    Comment: pt. declined condoms  Other Topics Concern   Not on file  Social History Narrative   Not on file   Social Determinants of Health   Financial Resource Strain: Not on file  Food Insecurity: Not on file  Transportation Needs: Not on file  Physical Activity: Not on file  Stress: Not on file  Social Connections: Not on file  Intimate Partner Violence: Not on file    Review of Systems  Constitutional:  Negative for chills, diaphoresis, fever, malaise/fatigue and weight loss.  HENT:  Negative for congestion, hearing loss, nosebleeds, sore throat and tinnitus.   Eyes:  Negative for blurred vision, photophobia and  redness.  Respiratory:  Negative for cough, hemoptysis, sputum production, shortness of breath, wheezing and stridor.   Cardiovascular:  Negative for chest pain, palpitations, orthopnea, claudication, leg swelling and PND.  Gastrointestinal:  Negative for abdominal pain, blood in stool, constipation, diarrhea, heartburn, nausea and vomiting.  Genitourinary:  Negative for dysuria, flank pain, frequency, hematuria and urgency.  Musculoskeletal:  Negative for back pain, falls, joint pain, myalgias and neck pain.  Skin:  Negative for itching and rash.  Neurological:  Positive for sensory change. Negative for dizziness, tingling, tremors, speech change, focal weakness, seizures, loss of consciousness, weakness and headaches.  Endo/Heme/Allergies:  Negative for environmental allergies and polydipsia. Does not bruise/bleed easily.  Psychiatric/Behavioral:  Positive for depression and suicidal ideas. Negative for memory loss and substance abuse. The patient is not nervous/anxious and does not have insomnia.         Objective    BP 122/71   Pulse 71   Wt 196 lb 3.2 oz (89 kg)   BMI 26.61 kg/m   Physical Exam Vitals reviewed.  Constitutional:      Appearance: Normal appearance. He is well-developed. He is not diaphoretic.  HENT:     Head: Normocephalic and atraumatic.     Nose: No nasal deformity, septal deviation, mucosal edema or rhinorrhea.     Right Sinus: No maxillary sinus tenderness or frontal sinus tenderness.     Left Sinus: No maxillary sinus tenderness or frontal sinus tenderness.     Mouth/Throat:     Pharynx: No oropharyngeal exudate.  Eyes:     General: No scleral icterus.    Conjunctiva/sclera: Conjunctivae normal.     Pupils: Pupils are equal, round, and reactive to light.  Neck:     Thyroid: No thyromegaly.     Vascular: No carotid bruit or JVD.     Trachea: Trachea normal. No tracheal tenderness or tracheal deviation.  Cardiovascular:     Rate and Rhythm: Normal  rate and regular rhythm.     Chest Wall: PMI is not displaced.     Pulses: Normal pulses. No decreased pulses.     Heart sounds: Normal heart sounds, S1 normal and S2 normal. Heart sounds not distant. No murmur heard.    No systolic murmur is  present.     No diastolic murmur is present.     No friction rub. No gallop. No S3 or S4 sounds.  Pulmonary:     Effort: No tachypnea, accessory muscle usage or respiratory distress.     Breath sounds: No stridor. No decreased breath sounds, wheezing, rhonchi or rales.  Chest:     Chest wall: No tenderness.  Abdominal:     General: Bowel sounds are normal. There is no distension.     Palpations: Abdomen is soft. Abdomen is not rigid.     Tenderness: There is no abdominal tenderness. There is no guarding or rebound.  Musculoskeletal:        General: Normal range of motion.     Cervical back: Normal range of motion and neck supple. No edema, erythema or rigidity. No muscular tenderness. Normal range of motion.  Lymphadenopathy:     Head:     Right side of head: No submental or submandibular adenopathy.     Left side of head: No submental or submandibular adenopathy.     Cervical: No cervical adenopathy.  Skin:    General: Skin is warm and dry.     Coloration: Skin is not pale.     Findings: No rash.     Nails: There is no clubbing.  Neurological:     Mental Status: He is alert and oriented to person, place, and time.     Sensory: No sensory deficit.  Psychiatric:        Attention and Perception: He is inattentive.        Mood and Affect: Mood is depressed. Affect is tearful.        Speech: Speech is delayed.        Behavior: Behavior is withdrawn. Behavior is not combative.        Thought Content: Thought content includes suicidal ideation. Thought content does not include homicidal ideation. Thought content does not include homicidal or suicidal plan.        Cognition and Memory: Cognition and memory normal.        Judgment: Judgment  normal. Judgment is not impulsive or inappropriate.    No evidence of cuts on the skin that are recent     Assessment & Plan:   Problem List Items Addressed This Visit       Cardiovascular and Mediastinum   Essential hypertension    Hypertension well-controlled continue current medications refills given      Relevant Medications   amLODipine (NORVASC) 10 MG tablet   valsartan-hydrochlorothiazide (DIOVAN-HCT) 320-25 MG tablet   hydrALAZINE (APRESOLINE) 25 MG tablet     Endocrine   Diabetes mellitus type 1 ([place]) - Primary (Chronic)    Poorly controlled diabetes he is not using insulin regularly I conferred with clinical pharmacy and we will increase Basaglar to 22 units a day insulin lispro to 10 units twice a day before meals we will get him in with clinical pharmacy in 4 weeks and once he achieves Medicaid which he is applying for we will see about getting an endocrinology visit with maybe an insulin pump and a CGM  Assess lipid panel and urine for albumin      Relevant Medications   valsartan-hydrochlorothiazide (DIOVAN-HCT) 320-25 MG tablet   Insulin Glargine (BASAGLAR KWIKPEN) 100 UNIT/ML   insulin lispro (HUMALOG) 100 UNIT/ML KwikPen   Other Relevant Orders   POCT glycosylated hemoglobin (Hb A1C) (Completed)   POCT glucose (manual entry) (Completed)   Urine microalbumin-creatinine with uACR  Lipid panel     Other   Syphilis    History of active syphilis in the past his syphilis titers were slightly increased I conferred with infectious disease I will see him again in June and hold off on Bicillin      MDD (major depressive disorder), recurrent episode, severe ([place])    Plan to begin sertraline 100 mg daily      Relevant Medications   sertraline (ZOLOFT) 100 MG tablet   Suicidal ideation    Recurrent suicidal ideation the patient does not have a clear plan I believe he will be fine to be discharged we will get him mental health follow-up with clinical social work  and we will start sertraline 100 mg daily he did contract for safety     35 minutes spent excess time needed to assess suicidal ideation Return in about 2 months (around 04/13/2022) for htn, diabetes.   Asencion Noble, MD

## 2022-02-10 NOTE — Assessment & Plan Note (Signed)
Plan to begin sertraline 100 mg daily

## 2022-02-10 NOTE — Assessment & Plan Note (Signed)
Recurrent suicidal ideation the patient does not have a clear plan I believe he will be fine to be discharged we will get him mental health follow-up with clinical social work and we will start sertraline 100 mg daily he did contract for safety

## 2022-02-10 NOTE — Assessment & Plan Note (Addendum)
Poorly controlled diabetes he is not using insulin regularly I conferred with clinical pharmacy and we will increase Basaglar to 22 units a day insulin lispro to 10 units twice a day before meals we will get him in with clinical pharmacy in 4 weeks and once he achieves Medicaid which he is applying for we will see about getting an endocrinology visit with maybe an insulin pump and a CGM  Assess lipid panel and urine for albumin

## 2022-02-10 NOTE — Telephone Encounter (Signed)
Severe depression  started meds  help him engage to go to [facility]

## 2022-02-11 LAB — LIPID PANEL
Chol/HDL Ratio: 1.7 ratio (ref 0.0–5.0)
Cholesterol, Total: 169 mg/dL (ref 100–199)
HDL: 99 mg/dL (ref >39)
LDL Chol Calc (NIH): 56 mg/dL (ref 0–99)
Triglycerides: 76 mg/dL (ref 0–149)
VLDL Cholesterol Cal: 14 mg/dL (ref 5–40)

## 2022-02-11 LAB — MICROALBUMIN / CREATININE URINE RATIO
Creatinine, Urine: 48.1 mg/dL
Microalb/Creat Ratio: 285 mg/g creat — ABNORMAL HIGH (ref 0–29)
Microalbumin, Urine: 137 ug/mL

## 2022-02-11 NOTE — Progress Notes (Signed)
Let pt know there is protein in urine which will improve if we can get his DM under control, cholesterol is at goal, no need for cholesterol pill

## 2022-02-14 ENCOUNTER — Other Ambulatory Visit: Payer: Self-pay

## 2022-02-14 ENCOUNTER — Telehealth: Payer: Self-pay

## 2022-02-14 NOTE — Telephone Encounter (Signed)
-----   Message from Storm Frisk, MD sent at 02/11/2022  3:57 PM EST ----- Let pt know there is protein in urine which will improve if we can get his DM under control, cholesterol is at goal, no need for cholesterol pill

## 2022-02-14 NOTE — Telephone Encounter (Signed)
Pt was called and no vm was left due to mailbox being full. Information has been sent to nurse pool.  

## 2022-02-15 ENCOUNTER — Other Ambulatory Visit: Payer: Self-pay

## 2022-02-19 ENCOUNTER — Emergency Department (HOSPITAL_COMMUNITY)
Admission: EM | Admit: 2022-02-19 | Discharge: 2022-02-20 | Disposition: A | Discharge status: Home / Self Care | Payer: No Typology Code available for payment source | Attending: Emergency Medicine | Admitting: Emergency Medicine

## 2022-02-19 ENCOUNTER — Emergency Department (HOSPITAL_COMMUNITY): Payer: No Typology Code available for payment source

## 2022-02-19 DIAGNOSIS — Z794 Long term (current) use of insulin: Secondary | ICD-10-CM | POA: Insufficient documentation

## 2022-02-19 DIAGNOSIS — B349 Viral infection, unspecified: Secondary | ICD-10-CM | POA: Insufficient documentation

## 2022-02-19 DIAGNOSIS — E109 Type 1 diabetes mellitus without complications: Secondary | ICD-10-CM | POA: Insufficient documentation

## 2022-02-19 DIAGNOSIS — R0789 Other chest pain: Secondary | ICD-10-CM

## 2022-02-19 DIAGNOSIS — E162 Hypoglycemia, unspecified: Secondary | ICD-10-CM | POA: Insufficient documentation

## 2022-02-19 DIAGNOSIS — Z1152 Encounter for screening for COVID-19: Secondary | ICD-10-CM | POA: Insufficient documentation

## 2022-02-19 LAB — CBC WITH DIFFERENTIAL/PLATELET
Abs Immature Granulocytes: 0.03 10*3/uL (ref 0.00–0.07)
Basophils Absolute: 0 10*3/uL (ref 0.0–0.1)
Basophils Relative: 0 %
Eosinophils Absolute: 0.2 10*3/uL (ref 0.0–0.5)
Eosinophils Relative: 2 %
HCT: 39.1 % (ref 39.0–52.0)
Hemoglobin: 13.3 g/dL (ref 13.0–17.0)
Immature Granulocytes: 0 %
Lymphocytes Relative: 20 %
Lymphs Abs: 1.8 10*3/uL (ref 0.7–4.0)
MCH: 32.5 pg (ref 26.0–34.0)
MCHC: 34 g/dL (ref 30.0–36.0)
MCV: 95.6 fL (ref 80.0–100.0)
Monocytes Absolute: 0.7 10*3/uL (ref 0.1–1.0)
Monocytes Relative: 8 %
Neutro Abs: 6.5 10*3/uL (ref 1.7–7.7)
Neutrophils Relative %: 70 %
Platelets: 267 10*3/uL (ref 150–400)
RBC: 4.09 MIL/uL — ABNORMAL LOW (ref 4.22–5.81)
RDW: 14.8 % (ref 11.5–15.5)
WBC: 9.3 10*3/uL (ref 4.0–10.5)
nRBC: 0 % (ref 0.0–0.2)

## 2022-02-19 LAB — RESP PANEL BY RT-PCR (RSV, FLU A&B, COVID)  RVPGX2
Influenza A by PCR: NEGATIVE
Influenza B by PCR: NEGATIVE
Resp Syncytial Virus by PCR: NEGATIVE
SARS Coronavirus 2 by RT PCR: NEGATIVE

## 2022-02-19 LAB — BASIC METABOLIC PANEL
Anion gap: 9 (ref 5–15)
BUN: 8 mg/dL (ref 6–20)
CO2: 24 mmol/L (ref 22–32)
Calcium: 9.4 mg/dL (ref 8.9–10.3)
Chloride: 108 mmol/L (ref 98–111)
Creatinine, Ser: 1.3 mg/dL — ABNORMAL HIGH (ref 0.61–1.24)
GFR, Estimated: >60 mL/min (ref >60)
Glucose, Bld: 41 mg/dL — CL (ref 70–99)
Potassium: 3.8 mmol/L (ref 3.5–5.1)
Sodium: 141 mmol/L (ref 135–145)

## 2022-02-19 LAB — CBG MONITORING, ED
Glucose-Capillary: 33 mg/dL — CL (ref 70–99)
Glucose-Capillary: 41 mg/dL — CL (ref 70–99)
Glucose-Capillary: 192 mg/dL — ABNORMAL HIGH (ref 70–99)

## 2022-02-19 LAB — TROPONIN I (HIGH SENSITIVITY): Troponin I (High Sensitivity): 3 ng/L (ref <18)

## 2022-02-19 MED ORDER — DEXTROSE 50 % IV SOLN
INTRAVENOUS | Status: AC
  Administered 2022-02-20: 50 mL via INTRAVENOUS
  Filled 2022-02-19: qty 50

## 2022-02-19 MED ORDER — ONDANSETRON HCL 4 MG/2ML IJ SOLN
4.0000 mg | Freq: Once | INTRAMUSCULAR | Status: AC
  Administered 2022-02-20: 4 mg via INTRAVENOUS
  Filled 2022-02-19: qty 2

## 2022-02-19 MED ORDER — SODIUM CHLORIDE 0.9 % IV BOLUS
1000.0000 mL | Freq: Once | INTRAVENOUS | Status: AC
  Administered 2022-02-20: 1000 mL via INTRAVENOUS

## 2022-02-19 MED ORDER — SODIUM CHLORIDE 0.9% FLUSH: 3.0000 mL | INTRAVENOUS | Status: DC | PRN

## 2022-02-19 MED ORDER — SODIUM CHLORIDE 0.9 % IV SOLN: 250.0000 mL | INTRAVENOUS | Status: DC | PRN

## 2022-02-19 MED ORDER — DEXTROSE 50 % IV SOLN
25.0000 g | Freq: Once | INTRAVENOUS | Status: AC
  Administered 2022-02-19: 25 g via INTRAVENOUS
  Filled 2022-02-19: qty 50

## 2022-02-19 MED ORDER — SODIUM CHLORIDE 0.9% FLUSH: 3.0000 mL | Freq: Two times a day (BID) | INTRAVENOUS | Status: DC

## 2022-02-19 MED ORDER — ONDANSETRON 4 MG PO TBDP: 4.0000 mg | ORAL_TABLET | Freq: Once | ORAL | Status: DC

## 2022-02-19 NOTE — ED Notes (Signed)
Pt given 15 g glucose gel x 2. Will recheck.

## 2022-02-19 NOTE — ED Notes (Addendum)
Pt attempting to drink and eat. Amp D50 given IV.

## 2022-02-19 NOTE — ED Triage Notes (Signed)
PT states he starting feeling bad a few days ago after seeing his mom who was recovered from covid. He is covered in sweat and states he is diabetic. He was stumbling in waiting room.

## 2022-02-19 NOTE — ED Provider Notes (Signed)
[facility] EMERGENCY DEPARTMENT Provider Note   CSN: [id number] Arrival date & time: 02/19/22  2041     History {Add pertinent medical, surgical, social history, OB history to HPI:1} Chief Complaint  Patient presents with   Hypoglycemia         [name] is a 36 y.o. male, history of type 1 diabetes, who presents to the ED secondary to cough, shortness of breath, chest discomfort that started yesterday.  States that his left-sided chest pressure, and now has nausea and reduced appetite given the symptoms.  Denies any fevers or chills or headache.  While waiting in the ER waiting room, patient became very fatigued, and weak, so his glucose was checked and it was around 33.  He was given an amp of glucose, and sent to the back.  He states he feels very weak, and last gave himself 10 units of Humalog at around 5 PM today.  Has not eaten much today.    Home Medications Prior to Admission medications   Medication Sig Start Date End Date Taking? Authorizing Provider  amLODipine (NORVASC) 10 MG tablet Take 1 tablet (10 mg total) by mouth daily. For high blood pressure 02/10/22 05/15/22  Elsie Stain, MD  blood glucose meter kit and supplies Dispense based on patient and insurance preference. Use up to four times daily as directed: For blood sugar monitoring 10/08/20   Lindell Spar I, NP  elvitegravir-cobicistat-emtricitabine-tenofovir (GENVOYA) 150-150-200-10 MG TABS tablet Take 1 tablet by mouth daily with breakfast. 02/03/22   Mignon Pine, DO  glucose blood (TRUE METRIX BLOOD GLUCOSE TEST) test strip use up to 4 times daily as directed for blood sugar monitoring 11/18/21   Elsie Stain, MD  hydrALAZINE (APRESOLINE) 25 MG tablet Take 1 tablet (25 mg total) by mouth every 8 (eight) hours. For hypertension 02/10/22   Elsie Stain, MD  Insulin Glargine [facility]) 100 UNIT/ML Inject 22 Units into the skin daily. 02/10/22   Elsie Stain, MD  insulin lispro (HUMALOG) 100 UNIT/ML KwikPen Inject 10 Units into the skin 2 (two) times daily before lunch and supper. Hold if blood sugar less than 150 02/10/22   Elsie Stain, MD  Insulin Pen Needle 31G X 5 MM MISC Use with insulin pen 02/10/22   Elsie Stain, MD  sertraline (ZOLOFT) 100 MG tablet Take 1 tablet (100 mg total) by mouth daily. 02/10/22   Elsie Stain, MD  sodium fluoride (PREVIDENT 5000 PLUS) 1.1 % CREA dental cream Brush on teeth with a toothbrush after evening mouth care. Spit out excess and do not rinse Patient not taking: Reported on 02/10/2022 01/28/21     valsartan-hydrochlorothiazide (DIOVAN-HCT) 320-25 MG tablet Take 1 tablet by mouth daily. 02/10/22   Elsie Stain, MD  pravastatin (PRAVACHOL) 20 MG tablet Take 1 tablet (20 mg total) by mouth daily. Patient not taking: Reported on 03/31/2019 03/08/19 07/08/19  Gildardo Pounds, NP      Allergies    Shellfish allergy and Aspirin    Review of Systems   Review of Systems  Constitutional:  Positive for fatigue. Negative for chills and fever.  HENT:  Positive for congestion and sore throat.   Respiratory:  Positive for cough and shortness of breath.   Cardiovascular:  Positive for chest pain.    Physical Exam Updated Vital Signs BP (!) 144/96 (BP Location: Left Arm)   Pulse (!) 56   Temp (!) 97.5 F (36.4 C)  Resp 15   SpO2 99%  Physical Exam Vitals and nursing note reviewed.  Constitutional:      General: He is not in acute distress.    Appearance: He is well-developed.  HENT:     Head: Normocephalic and atraumatic.     Mouth/Throat:     Pharynx: Posterior oropharyngeal erythema present. No oropharyngeal exudate.  Eyes:     Conjunctiva/sclera: Conjunctivae normal.  Cardiovascular:     Rate and Rhythm: Normal rate and regular rhythm.     Heart sounds: No murmur heard. Pulmonary:     Effort: Pulmonary effort is normal. No respiratory distress.     Breath sounds: Normal breath  sounds.  Abdominal:     Palpations: Abdomen is soft.     Tenderness: There is no abdominal tenderness.  Musculoskeletal:        General: No swelling.     Cervical back: Neck supple.  Skin:    General: Skin is warm and dry.     Capillary Refill: Capillary refill takes less than 2 seconds.  Neurological:     Mental Status: He is alert.  Psychiatric:        Mood and Affect: Mood normal.     ED Results / Procedures / Treatments   Labs (all labs ordered are listed, but only abnormal results are displayed) Labs Reviewed  CBG MONITORING, ED - Abnormal; Notable for the following components:      Result Value   Glucose-Capillary 33 (*)    All other components within normal limits  CBG MONITORING, ED - Abnormal; Notable for the following components:   Glucose-Capillary 41 (*)    All other components within normal limits  RESP PANEL BY RT-PCR (RSV, FLU A&B, COVID)  RVPGX2  BASIC METABOLIC PANEL  CBC WITH DIFFERENTIAL/PLATELET  CBG MONITORING, ED  CBG MONITORING, ED  TROPONIN I (HIGH SENSITIVITY)    EKG None  Radiology No results found.  Procedures Procedures  {Document cardiac monitor, telemetry assessment procedure when appropriate:1}  Medications Ordered in ED Medications  ondansetron (ZOFRAN-ODT) disintegrating tablet 4 mg (has no administration in time range)  dextrose 50 % solution (has no administration in time range)  sodium chloride flush (NS) 0.9 % injection 3 mL (has no administration in time range)  sodium chloride flush (NS) 0.9 % injection 3 mL (has no administration in time range)  0.9 %  sodium chloride infusion (has no administration in time range)    ED Course/ Medical Decision Making/ A&P                           Medical Decision Making Risk Prescription drug management.   ***  {Document critical care time when appropriate:1} {Document review of labs and clinical decision tools ie heart score, Chads2Vasc2 etc:1}  {Document your independent  review of radiology images, and any outside records:1} {Document your discussion with family members, caretakers, and with consultants:1} {Document social determinants of health affecting pt's care:1} {Document your decision making why or why not admission, treatments were needed:1} Final Clinical Impression(s) / ED Diagnoses Final diagnoses:  None    Rx / DC Orders ED Discharge Orders     None

## 2022-02-19 NOTE — ED Provider Triage Note (Cosign Needed Addendum)
Emergency Medicine Provider Triage Evaluation Note  [name] , a 36 y.o. male  was evaluated in triage.  Pt complains of cough, shortness of breath, and chest pain that started yesterday. Describes pain as left sided chest pressure. Developed nausea today and has been unable to eat due to symptoms. No vomiting, diarrhea, abdominal pain, or fever. Pt is T1DM. Compliant with medications. No syncope. Some lightheadedness with standing. No focal weakness, headache, or paresthesias. Hypoglycemic at 33 in triage. Glucose given by nursing staff.   Review of Systems  Positive: See HPI Negative: See HPI  Physical Exam  Pulse 60   Temp 97.7 F (36.5 C) (Oral)   Resp 18   SpO2 98%  Gen:   Awake, diaphoretic Resp:  Normal effort LCTA MSK:   Moves extremities without difficulty  Other:  Tachycardic, equal strength bilaterally, alert and oriented  Medical Decision Making  Medically screening exam initiated at 9:16 PM.  Appropriate orders placed.  [name] was informed that the remainder of the evaluation will be completed by another provider, this initial triage assessment does not replace that evaluation, and the importance of remaining in the ED until their evaluation is complete.     [name] 02/19/22 2118    Tonette Lederer, PA-C 02/19/22 2118

## 2022-02-20 LAB — CBG MONITORING, ED
Glucose-Capillary: 83 mg/dL (ref 70–99)
Glucose-Capillary: 51 mg/dL — ABNORMAL LOW (ref 70–99)
Glucose-Capillary: 136 mg/dL — ABNORMAL HIGH (ref 70–99)
Glucose-Capillary: 33 mg/dL — CL (ref 70–99)
Glucose-Capillary: 92 mg/dL (ref 70–99)

## 2022-02-20 LAB — TROPONIN I (HIGH SENSITIVITY): Troponin I (High Sensitivity): 4 ng/L (ref <18)

## 2022-02-20 MED ORDER — DEXTROSE 5 % IV SOLN: Freq: Once | INTRAVENOUS | Status: AC

## 2022-02-20 MED ORDER — ZIPRASIDONE MESYLATE 20 MG IM SOLR: 20.0000 mg | INTRAMUSCULAR | Status: DC | PRN

## 2022-02-20 MED ORDER — DEXTROSE 50 % IV SOLN: 1.0000 | Freq: Once | INTRAVENOUS | Status: DC

## 2022-02-20 MED ORDER — DEXTROSE 50 % IV SOLN
1.0000 | Freq: Once | INTRAVENOUS | Status: AC
  Filled 2022-02-20: qty 50

## 2022-02-20 MED ORDER — DEXTROSE 50 % IV SOLN: 50.0000 mL | Freq: Once | INTRAVENOUS | Status: AC

## 2022-02-20 MED ORDER — ONDANSETRON HCL 4 MG/2ML IJ SOLN: 4.0000 mg | Freq: Once | INTRAMUSCULAR | Status: AC

## 2022-02-20 MED ORDER — ONDANSETRON HCL 4 MG/2ML IJ SOLN
INTRAMUSCULAR | Status: AC
  Administered 2022-02-20: 4 mg via INTRAVENOUS
  Filled 2022-02-20: qty 2

## 2022-02-20 MED ORDER — DEXTROSE 50 % IV SOLN
INTRAVENOUS | Status: AC
  Administered 2022-02-20: 50 mL via INTRAVENOUS
  Filled 2022-02-20: qty 50

## 2022-02-20 NOTE — ED Notes (Signed)
CBG 51. Pt given 8oz orange juice. Sponseller PA made aware
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
## 2022-02-20 NOTE — ED Provider Notes (Signed)
I became aware of this patient at around 0 545.  I was told by physician assistant that he was here for hypoglycemia.  He reportedly has a surgery of attempted suicide by insulin overdose.  He made the comment to the physician assistant that he would rather die than live with diabetes.  He has recurrent episodes of hypoglycemia here requiring a D10 drip.  Needs to be admitted to the hospital for that the patient is refusing to stay.  At this point it seems the patient is high risk to himself even though he is denied a suicide attempt.  There is no other obvious cause for his hypoglycemia and the fact that he would die if he was not on the D10 infusion at this time makes me think that it is appropriate to involuntarily commit him until at least his blood sugars improve to the point that is no longer a threat to his life or psychiatry clears him. Will initiate chemical sedation PRN.   CRITICAL CARE Performed by: Marily Memos Total critical care time: 35 minutes Critical care time was exclusive of separately billable procedures and treating other patients. Critical care was necessary to treat or prevent imminent or life-threatening deterioration. Critical care was time spent personally by me on the following activities: development of treatment plan with patient and/or surrogate as well as nursing, discussions with consultants, evaluation of patient's response to treatment, examination of patient, obtaining history from patient or surrogate, ordering and performing treatments and interventions, ordering and review of laboratory studies, ordering and review of radiographic studies, pulse oximetry and re-evaluation of patient's condition.     Marily Memos, MD 02/20/22 [phone]

## 2022-02-20 NOTE — ED Notes (Signed)
This Diplomatic Services operational officer called [employer] non emergency line to see about getting the patient back since he is under IVC. I Was told that they are making a attempt to locate him.

## 2022-02-20 NOTE — ED Notes (Addendum)
Per Cecilio Asper NP, pt to be observed in ED and evaluated by psychiatry later this morning.

## 2022-02-20 NOTE — ED Notes (Signed)
Per Charge RN Tresa Endo, [employer] attempting to locate pt.

## 2022-02-20 NOTE — ED Notes (Signed)
Pt now under IVC due risk of harming himself. Denies SI/HI. When pt was informed of this, pt became agitated and stated he was going to leave anyway. Pt proceeded to disconnect his IV from the pump and walked towards the back of the ED with security following close by. Pt eventually made his way to the lobby to wait for ride. Roommate arrived but agreed to convince pt to stay. Pt currently still in lobby with security and [employer]. [employer] waiting for IVC paperwork to be sent back from magistrate before they can intervene.

## 2022-02-20 NOTE — ED Notes (Signed)
Pt IVC papers are complete and have been faxed back from magistrate but due to patient leaving BEFORE paperwork was done a [employer] officer can not sign them until he is brought back. Everything is placed on the purple clipboard. If patient doesn't return within 24 hours [employer] non-emergency line needs to be called and a unit will need to go locate the patient.

## 2022-03-01 ENCOUNTER — Other Ambulatory Visit: Payer: Self-pay

## 2022-03-08 NOTE — Telephone Encounter (Signed)
Please follow up to see if patient went to [facility] and if there is any other things we can assist with. Please read provider note.

## 2022-03-21 ENCOUNTER — Emergency Department (HOSPITAL_COMMUNITY)
Admission: EM | Admit: 2022-03-21 | Discharge: 2022-03-22 | Disposition: A | Discharge status: Home / Self Care | Payer: No Typology Code available for payment source | Attending: Internal Medicine | Admitting: Internal Medicine

## 2022-03-21 ENCOUNTER — Encounter (HOSPITAL_COMMUNITY): Payer: Self-pay | Admitting: Emergency Medicine

## 2022-03-21 ENCOUNTER — Emergency Department (HOSPITAL_COMMUNITY): Payer: No Typology Code available for payment source

## 2022-03-21 DIAGNOSIS — E109 Type 1 diabetes mellitus without complications: Secondary | ICD-10-CM | POA: Diagnosis present

## 2022-03-21 DIAGNOSIS — B20 Human immunodeficiency virus [HIV] disease: Secondary | ICD-10-CM | POA: Insufficient documentation

## 2022-03-21 DIAGNOSIS — E10649 Type 1 diabetes mellitus with hypoglycemia without coma: Secondary | ICD-10-CM | POA: Insufficient documentation

## 2022-03-21 DIAGNOSIS — Z1152 Encounter for screening for COVID-19: Secondary | ICD-10-CM | POA: Insufficient documentation

## 2022-03-21 DIAGNOSIS — Z8659 Personal history of other mental and behavioral disorders: Secondary | ICD-10-CM

## 2022-03-21 DIAGNOSIS — Z79899 Other long term (current) drug therapy: Secondary | ICD-10-CM | POA: Insufficient documentation

## 2022-03-21 DIAGNOSIS — Z66 Do not resuscitate: Secondary | ICD-10-CM | POA: Insufficient documentation

## 2022-03-21 DIAGNOSIS — E1021 Type 1 diabetes mellitus with diabetic nephropathy: Secondary | ICD-10-CM

## 2022-03-21 DIAGNOSIS — Z21 Asymptomatic human immunodeficiency virus [HIV] infection status: Secondary | ICD-10-CM | POA: Insufficient documentation

## 2022-03-21 DIAGNOSIS — E162 Hypoglycemia, unspecified: Secondary | ICD-10-CM | POA: Diagnosis present

## 2022-03-21 DIAGNOSIS — Z794 Long term (current) use of insulin: Secondary | ICD-10-CM | POA: Insufficient documentation

## 2022-03-21 DIAGNOSIS — I10 Essential (primary) hypertension: Secondary | ICD-10-CM | POA: Diagnosis present

## 2022-03-21 LAB — CBC WITH DIFFERENTIAL/PLATELET
Abs Immature Granulocytes: 0.04 10*3/uL (ref 0.00–0.07)
Basophils Absolute: 0 10*3/uL (ref 0.0–0.1)
Basophils Relative: 0 %
Eosinophils Absolute: 0.1 10*3/uL (ref 0.0–0.5)
Eosinophils Relative: 1 %
HCT: 40.2 % (ref 39.0–52.0)
Hemoglobin: 13.5 g/dL (ref 13.0–17.0)
Immature Granulocytes: 0 %
Lymphocytes Relative: 13 %
Lymphs Abs: 1.5 10*3/uL (ref 0.7–4.0)
MCH: 32.2 pg (ref 26.0–34.0)
MCHC: 33.6 g/dL (ref 30.0–36.0)
MCV: 95.9 fL (ref 80.0–100.0)
Monocytes Absolute: 0.7 10*3/uL (ref 0.1–1.0)
Monocytes Relative: 6 %
Neutro Abs: 8.7 10*3/uL — ABNORMAL HIGH (ref 1.7–7.7)
Neutrophils Relative %: 80 %
Platelets: 246 10*3/uL (ref 150–400)
RBC: 4.19 MIL/uL — ABNORMAL LOW (ref 4.22–5.81)
RDW: 13.5 % (ref 11.5–15.5)
WBC: 11 10*3/uL — ABNORMAL HIGH (ref 4.0–10.5)
nRBC: 0 % (ref 0.0–0.2)

## 2022-03-21 LAB — COMPREHENSIVE METABOLIC PANEL
ALT: 10 U/L (ref 0–44)
AST: 19 U/L (ref 15–41)
Albumin: 2.7 g/dL — ABNORMAL LOW (ref 3.5–5.0)
Alkaline Phosphatase: 27 U/L — ABNORMAL LOW (ref 38–126)
Anion gap: 5 (ref 5–15)
BUN: 12 mg/dL (ref 6–20)
CO2: 17 mmol/L — ABNORMAL LOW (ref 22–32)
Calcium: 6.4 mg/dL — CL (ref 8.9–10.3)
Chloride: 116 mmol/L — ABNORMAL HIGH (ref 98–111)
Creatinine, Ser: 0.97 mg/dL (ref 0.61–1.24)
GFR, Estimated: >60 mL/min (ref >60)
Glucose, Bld: 125 mg/dL — ABNORMAL HIGH (ref 70–99)
Potassium: 4.4 mmol/L (ref 3.5–5.1)
Sodium: 138 mmol/L (ref 135–145)
Total Bilirubin: 0.5 mg/dL (ref 0.3–1.2)
Total Protein: 4.9 g/dL — ABNORMAL LOW (ref 6.5–8.1)

## 2022-03-21 LAB — BLOOD GAS, VENOUS
Acid-Base Excess: 1.2 mmol/L (ref 0.0–2.0)
Bicarbonate: 26.6 mmol/L (ref 20.0–28.0)
O2 Saturation: 97.4 %
Patient temperature: 37
pCO2, Ven: 44 mmHg (ref 44–60)
pH, Ven: 7.39 (ref 7.25–7.43)
pO2, Ven: 81 mmHg — ABNORMAL HIGH (ref 32–45)

## 2022-03-21 LAB — RESP PANEL BY RT-PCR (RSV, FLU A&B, COVID)  RVPGX2
Influenza A by PCR: NEGATIVE
Influenza B by PCR: NEGATIVE
Resp Syncytial Virus by PCR: NEGATIVE
SARS Coronavirus 2 by RT PCR: NEGATIVE

## 2022-03-21 LAB — LACTIC ACID, PLASMA: Lactic Acid, Venous: 0.7 mmol/L (ref 0.5–1.9)

## 2022-03-21 LAB — ETHANOL: Alcohol, Ethyl (B): <10 mg/dL (ref <10)

## 2022-03-21 LAB — CBG MONITORING, ED
Glucose-Capillary: 77 mg/dL (ref 70–99)
Glucose-Capillary: 69 mg/dL — ABNORMAL LOW (ref 70–99)
Glucose-Capillary: 108 mg/dL — ABNORMAL HIGH (ref 70–99)
Glucose-Capillary: 123 mg/dL — ABNORMAL HIGH (ref 70–99)
Glucose-Capillary: 42 mg/dL — CL (ref 70–99)
Glucose-Capillary: 101 mg/dL — ABNORMAL HIGH (ref 70–99)

## 2022-03-21 LAB — PHOSPHORUS: Phosphorus: 3.1 mg/dL (ref 2.5–4.6)

## 2022-03-21 LAB — ACETAMINOPHEN LEVEL: Acetaminophen (Tylenol), Serum: <10 ug/mL — ABNORMAL LOW (ref 10–30)

## 2022-03-21 LAB — BETA-HYDROXYBUTYRIC ACID: Beta-Hydroxybutyric Acid: 0.1 mmol/L (ref 0.05–0.27)

## 2022-03-21 LAB — CK: Total CK: 137 U/L (ref 49–397)

## 2022-03-21 LAB — MAGNESIUM: Magnesium: 2.2 mg/dL (ref 1.7–2.4)

## 2022-03-21 LAB — SALICYLATE LEVEL: Salicylate Lvl: <7 mg/dL — ABNORMAL LOW (ref 7.0–30.0)

## 2022-03-21 MED ORDER — CALCIUM GLUCONATE-NACL 1-0.675 GM/50ML-% IV SOLN
1.0000 g | Freq: Once | INTRAVENOUS | Status: AC
  Administered 2022-03-21: 1000 mg via INTRAVENOUS
  Filled 2022-03-21: qty 50

## 2022-03-21 MED ORDER — DEXTROSE 10 % IV SOLN: INTRAVENOUS | Status: DC

## 2022-03-21 MED ORDER — DEXTROSE 50 % IV SOLN
1.0000 | Freq: Once | INTRAVENOUS | Status: AC
  Administered 2022-03-21: 50 mL via INTRAVENOUS
  Filled 2022-03-21: qty 50

## 2022-03-21 MED ORDER — ELVITEG-COBIC-EMTRICIT-TENOFAF 150-150-200-10 MG PO TABS
1.0000 | ORAL_TABLET | Freq: Every day | ORAL | Status: DC
  Filled 2022-03-21: qty 1

## 2022-03-21 MED ORDER — SERTRALINE HCL 50 MG PO TABS: 100.0000 mg | ORAL_TABLET | Freq: Every day | ORAL | Status: DC

## 2022-03-21 MED ORDER — DEXTROSE 50 % IV SOLN: 1.0000 | INTRAVENOUS | Status: DC | PRN

## 2022-03-21 NOTE — ED Notes (Signed)
Urinal at bedside. ENMiles 

## 2022-03-21 NOTE — Assessment & Plan Note (Addendum)
History of suicidal ideations Patient at this time denies suicidal ideation Continue to monitor Check salicylate level acetaminophen level and U tox He still has flat affect Would benefit from behavioral health consult

## 2022-03-21 NOTE — H&P (Signed)
[name] QAS:[id number] DOB: 1985-04-03 DOA: 03/21/2022     PCP: Elsie Stain, MD   Outpatient Specialists:     ID  pt is unsure who it is  Patient arrived to ER on 03/21/22 at [place] Referred by Attending Lennice Sites, DO   Patient coming from:    home Lives  With family    Chief Complaint:   Chief Complaint  Patient presents with   Hypoglycemia    HPI: [name] is a 37 y.o. male with medical history significant of  DM1, HTN, HLD    Presented with   hypoglycemia, symptomatic, HIV on antivirals Pt was at work when his BG dropped Was given 3 cups of OJ CBG went up to 46 By the time EMS arrived back down to  34 and he became unresponsive After d10 BG up to 129 but only after 10 min back to 65 and pt symptomatic Pt with Type 1 DM   He has hx of recurrent hypoglycemia and was supposed to see his PCP for this  Yesterdays his BG was low  Reports this Am BG was 48 he ate sandwich and went to work at work gave himself long lasting without checking his BG And it started to drop He smokes occasionally does not drink on regular basis Denies overdosing on the insulin Denies suicidal ideations Pt states strongly that he wishes to be DNR/DNI Have discussed this with pt at length He is adamant that he does not wish any resuscitation measure or to be intubated States his family are not aware but it is up to him to make those decisions    Initial COVID TEST  NEGATIVE   Lab Results  Component Value Date   [place] 03/21/2022   [place] NEGATIVE 02/19/2022   [place] NEGATIVE 10/06/2020   [place] NEGATIVE 09/24/2020    Regarding pertinent Chronic problems:     Hyperlipidemia - on statins Pravachol (pravastatin)  Lipid Panel     Component Value Date/Time   CHOL 169 02/10/2022 1223   TRIG 76 02/10/2022 1223   HDL 99 02/10/2022 1223   CHOLHDL 1.7 02/10/2022 1223   CHOLHDL 2.2 10/21/2020 1142   VLDL 10 09/24/2020 1434    LDLCALC 56 02/10/2022 1223   [place] 64 10/21/2020 1142   LABVLDL 14 02/10/2022 1223    HTN on hydralazine, Diovan/HCTZ      DM 1 -  Lab Results  Component Value Date   HGBA1C 9.8 (A) 02/10/2022   on insulin,     HIV - on anitvarals      CKD stage IIIb- baseline Cr 1.3 CrCl cannot be calculated (Unknown ideal weight.).  Lab Results  Component Value Date   CREATININE 0.97 03/21/2022   CREATININE 1.30 (H) 02/19/2022   CREATININE 1.65 (H) 01/20/2022    While in ER:   BG dropping back down  Started on D10  CXR -  NON acute    Following Medications were ordered in ER: Medications  dextrose 10 % infusion ( Intravenous New Bag/Given 03/21/22 2157)  dextrose 50 % solution 50 mL (50 mLs Intravenous Given 03/21/22 2004)  dextrose 50 % solution 50 mL (50 mLs Intravenous Given 03/21/22 2156)    ___________  ED Triage Vitals [03/21/22 1911]  Enc Vitals Group     BP (!) 141/99     Pulse Rate 74     Resp 16     Temp 97.6 F (36.4 C)     Temp  Source Oral     SpO2 100 %     Weight      Height      Head Circumference      Peak Flow      Pain Score      Pain Loc      Pain Edu?      Excl. in [place]?   [id number]     _________________________________________ Significant initial  Findings: Abnormal Labs Reviewed  CBC WITH DIFFERENTIAL/PLATELET - Abnormal; Notable for the following components:      Result Value   WBC 11.0 (*)    RBC 4.19 (*)    Neutro Abs 8.7 (*)    All other components within normal limits  COMPREHENSIVE METABOLIC PANEL - Abnormal; Notable for the following components:   Chloride 116 (*)    CO2 17 (*)    Glucose, Bld 125 (*)    Calcium 6.4 (*)    Total Protein 4.9 (*)    Albumin 2.7 (*)    Alkaline Phosphatase 27 (*)    All other components within normal limits  CBG MONITORING, ED - Abnormal; Notable for the following components:   Glucose-Capillary 69 (*)    All other components within normal limits  CBG MONITORING, ED - Abnormal; Notable for the  following components:   Glucose-Capillary 108 (*)    All other components within normal limits  CBG MONITORING, ED - Abnormal; Notable for the following components:   Glucose-Capillary 42 (*)    All other components within normal limits   ________________ Troponin  ordered ECG: Ordered    The recent clinical data is shown below. Vitals:   03/21/22 1911 03/21/22 2030  BP: (!) 141/99 (!) 149/96  Pulse: 74 73  Resp: 16 19  Temp: 97.6 F (36.4 C)   TempSrc: Oral   SpO2: 100% 100%    WBC     Component Value Date/Time   WBC 11.0 (H) 03/21/2022 2018   LYMPHSABS 1.5 03/21/2022 2018   MONOABS 0.7 03/21/2022 2018   EOSABS 0.1 03/21/2022 2018   BASOSABS 0.0 03/21/2022 2018     Lactic Acid, Venous    Component Value Date/Time   LATICACIDVEN 1.1 04/20/2018 2116      UA   ordered     Results for orders placed or performed during the hospital encounter of 03/21/22  Resp panel by RT-PCR (RSV, Flu A&B, Covid) Anterior Nasal Swab     Status: None   Collection Time: 03/21/22  8:31 PM   Specimen: Anterior Nasal Swab  Result Value Ref Range Status   SARS Coronavirus 2 by RT PCR NEGATIVE NEGATIVE Final         Influenza A by PCR NEGATIVE NEGATIVE Final   Influenza B by PCR NEGATIVE NEGATIVE Final         Resp Syncytial Virus by PCR NEGATIVE NEGATIVE Final          _______________________________________________ Hospitalist was called for admission for  hypoglycemia   The following Work up has been ordered so far:  Orders Placed This Encounter  Procedures   Resp panel by RT-PCR (RSV, Flu A&B, Covid) Anterior Nasal Swab   DG Chest 2 View   CBC with Differential   Comprehensive metabolic panel   Urinalysis, Routine w reflex microscopic   Rapid urine drug screen (hospital performed)   Give 4 oz juice or regular soda   Consult to hospitalist   Airborne and Contact precautions   POC CBG, ED   CBG  monitoring, ED   POC CBG, ED   CBG monitoring, ED   POC CBG, ED     OTHER  Significant initial  Findings:  labs showing:  Recent Labs  Lab 03/21/22 2018 03/21/22 2237  NA 138  --   K 4.4  --   CO2 17*  --   GLUCOSE 125*  --   BUN 12  --   CREATININE 0.97  --   CALCIUM 6.4*  --   MG  --  2.2  PHOS  --  3.1    Cr  down from baseline see below Lab Results  Component Value Date   CREATININE 0.97 03/21/2022   CREATININE 1.30 (H) 02/19/2022   CREATININE 1.65 (H) 01/20/2022    Recent Labs  Lab 03/21/22 2018  AST 19  ALT 10  ALKPHOS 27*  BILITOT 0.5  PROT 4.9*  ALBUMIN 2.7*   Lab Results  Component Value Date   CALCIUM 6.4 (LL) 03/21/2022   PHOS 3.4 01/14/2020    Plt: Lab Results  Component Value Date   PLT 246 03/21/2022      COVID-19 Labs  No results for input(s): "DDIMER", "FERRITIN", "LDH", "CRP" in the last 72 hours.  Lab Results  Component Value Date   SARSCOV2NAA NEGATIVE 03/21/2022   SARSCOV2NAA NEGATIVE 02/19/2022   SARSCOV2NAA NEGATIVE 10/06/2020   [place] NEGATIVE 09/24/2020       Recent Labs  Lab 03/21/22 2018  WBC 11.0*  NEUTROABS 8.7*  HGB 13.5  HCT 40.2  MCV 95.9  PLT 246    HG/HCT  stable,      Component Value Date/Time   HGB 13.5 03/21/2022 2018   HCT 40.2 03/21/2022 2018   MCV 95.9 03/21/2022 2018     Cardiac Panel (last 3 results) Recent Labs    03/21/22 2237  CKTOTAL 137    DM  labs:  HbA1C: Recent Labs    10/18/21 0924 02/10/22 1131  HGBA1C 8.1* 9.8*    CBG (last 3)  Recent Labs    03/21/22 1930 03/21/22 2036 03/21/22 2149  GLUCAP 69* 108* 42*    Cultures:    Component Value Date/Time   SDES BLOOD LEFT HAND 05/20/2016 2032   SPECREQUEST IN PEDIATRIC BOTTLE [facility] 05/20/2016 2032   CULT NO GROWTH 5 DAYS 05/20/2016 2032   REPTSTATUS 05/25/2016 FINAL 05/20/2016 2032     Radiological Exams on Admission: DG Chest 2 View  Result Date: 03/21/2022 CLINICAL DATA:  Cough EXAM: CHEST - 2 VIEW COMPARISON:  02/19/2022 FINDINGS: The heart size and mediastinal contours are within  normal limits. Both lungs are clear. The visualized skeletal structures are unremarkable. IMPRESSION: No active cardiopulmonary disease. Electronically Signed   By: Donavan Foil M.D.   On: 03/21/2022 21:14   _______________________________________________________________________________________________________ Latest  Blood pressure (!) 149/96, pulse 73, temperature 97.6 F (36.4 C), temperature source Oral, resp. rate 19, SpO2 100 %.   Vitals  labs and radiology finding personally reviewed  Review of Systems:    Pertinent positives include:   confusion Constitutional:  No weight loss, night sweats, Fevers, chills, fatigue, weight loss  HEENT:  No headaches, Difficulty swallowing,Tooth/dental problems,Sore throat,  No sneezing, itching, ear ache, nasal congestion, post nasal drip,  Cardio-vascular:  No chest pain, Orthopnea, PND, anasarca, dizziness, palpitations.no Bilateral lower extremity swelling  GI:  No heartburn, indigestion, abdominal pain, nausea, vomiting, diarrhea, change in bowel habits, loss of appetite, melena, blood in stool, hematemesis Resp:  no shortness of breath at rest. No dyspnea  on exertion, No excess mucus, no productive cough, No non-productive cough, No coughing up of blood.No change in color of mucus.No wheezing. Skin:  no rash or lesions. No jaundice GU:  no dysuria, change in color of urine, no urgency or frequency. No straining to urinate.  No flank pain.  Musculoskeletal:  No joint pain or no joint swelling. No decreased range of motion. No back pain.  Psych:  No change in mood or affect. No depression or anxiety. No memory loss.  Neuro: no localizing neurological complaints, no tingling, no weakness, no double vision, no gait abnormality, no slurred speech, no   All systems reviewed and apart from HOPI all are negative _______________________________________________________________________________________________ Past Medical History:   Past  Medical History:  Diagnosis Date   Anxiety    Asthma    Closed displaced comminuted fracture of shaft of right tibia 11/01/2017   Closed fracture of base of fifth metatarsal bone of right foot 11/13/2017   Closed fracture of medial malleolus of right ankle 10/31/2017   Depression    Headache    "only w/stress" (11/01/2017)   HIV (human immunodeficiency virus infection) (HCC)    Migraine    "a few/month" (11/01/2017)   Refusal of blood transfusions as patient is Jehovah's Witness    Seizure (HCC)    "only w/my low blood sugars"  (11/01/2017)   Type I diabetes mellitus (HCC)     Past Surgical History:  Procedure Laterality Date   FRACTURE SURGERY     IM NAILING TIBIA Right 11/01/2017   INTRAMEDULLARY (IM) NAIL TIBIALRightGeneral   ORIF ANKLE FRACTURE Right 11/01/2017   ORIF ANKLE FRACTURE Right 11/01/2017   Procedure: OPEN REDUCTION INTERNAL FIXATION (ORIF) ANKLE FRACTURE;  Surgeon: Roby Lofts, MD;  Location: [facility] OR;  Service: Orthopedics;  Laterality: Right;   TIBIA IM NAIL INSERTION Right 11/01/2017   Procedure: INTRAMEDULLARY (IM) NAIL TIBIAL;  Surgeon: Roby Lofts, MD;  Location: [facility] OR;  Service: Orthopedics;  Laterality: Right;    Social History:  Ambulatory   independently      reports that he has been smoking cigars. He started smoking about 6 years ago. He has never used smokeless tobacco. He reports current alcohol use of about 7.0 standard drinks of alcohol per week. He reports current drug use. Frequency: 2.00 times per week. Drug: Marijuana.   Family History:  Family History  Problem Relation Age of Onset   Thyroid disease Mother    Diabetes Mother    Breast cancer Maternal Grandmother    Diabetes Maternal Grandmother    ______________________________________________________________________________________________ Allergies: Allergies  Allergen Reactions   Shellfish Allergy Anaphylaxis and Hives   Aspirin Other (See Comments)    Unknown reaction     Prior  to Admission medications   Medication Sig Start Date End Date Taking? Authorizing Provider  amLODipine (NORVASC) 10 MG tablet Take 1 tablet (10 mg total) by mouth daily. For high blood pressure 02/10/22 05/15/22  Storm Frisk, MD  blood glucose meter kit and supplies Dispense based on patient and insurance preference. Use up to four times daily as directed: For blood sugar monitoring 10/08/20   Armandina Stammer I, NP  elvitegravir-cobicistat-emtricitabine-tenofovir (GENVOYA) 150-150-200-10 MG TABS tablet Take 1 tablet by mouth daily with breakfast. 02/03/22   Kathlynn Grate, DO  glucose blood (TRUE METRIX BLOOD GLUCOSE TEST) test strip use up to 4 times daily as directed for blood sugar monitoring 11/18/21   Storm Frisk, MD  hydrALAZINE (APRESOLINE) 25 MG tablet  Take 1 tablet (25 mg total) by mouth every 8 (eight) hours. For hypertension 02/10/22   Elsie Stain, MD  Insulin Glargine [facility]) 100 UNIT/ML Inject 22 Units into the skin daily. 02/10/22   Elsie Stain, MD  insulin lispro (HUMALOG) 100 UNIT/ML KwikPen Inject 10 Units into the skin 2 (two) times daily before lunch and supper. Hold if blood sugar less than 150 02/10/22   Elsie Stain, MD  Insulin Pen Needle 31G X 5 MM MISC Use with insulin pen 02/10/22   Elsie Stain, MD  sertraline (ZOLOFT) 100 MG tablet Take 1 tablet (100 mg total) by mouth daily. 02/10/22   Elsie Stain, MD  sodium fluoride (PREVIDENT 5000 PLUS) 1.1 % CREA dental cream Brush on teeth with a toothbrush after evening mouth care. Spit out excess and do not rinse Patient not taking: Reported on 02/10/2022 01/28/21     valsartan-hydrochlorothiazide (DIOVAN-HCT) 320-25 MG tablet Take 1 tablet by mouth daily. 02/10/22   Elsie Stain, MD  pravastatin (PRAVACHOL) 20 MG tablet Take 1 tablet (20 mg total) by mouth daily. Patient not taking: Reported on 03/31/2019 03/08/19 07/08/19  Gildardo Pounds, NP    Physical Exam:    03/21/2022     8:30 PM 03/21/2022    7:11 PM 02/20/2022    5:15 AM  Vitals with BMI  Systolic 390 300 923  Diastolic 96 99 300  Pulse 73 74 63     1. General:  in No  Acute distress   Chronically ill   -appearing 2. Psychological: Alert and Oriented 3. Head/ENT:  Dry Mucous Membranes                          Head Non traumatic, neck supple                           Poor Dentition 4. SKIN:  decreased Skin turgor,  Skin clean Dry and intact no rash 5. Heart: Regular rate and rhythm no  Murmur, no Rub or gallop 6. Lungs:   no wheezes or crackles   7. Abdomen: Soft,  non-tender, Non distended bowel sounds present 8. Lower extremities: no clubbing, cyanosis, no  edema 9. Neurologically Grossly intact, moving all 4 extremities equally  10. MSK: Normal range of motion    Chart has been reviewed  ______________________________________________________________________________________________  Assessment/Plan 37 y.o. male with medical history significant of  DM1, HTN, HLD   Admitted for hypoglycemia   Present on Admission:  Hypoglycemia  Diabetes mellitus type 1 ([place])  Essential hypertension  Hypocalcemia   Diabetes mellitus type 1 ([place]) Given persistent hypoglycemia hold long lasting insulin Monitor BG and cover as  as needed   Hypoglycemia Diabetes education Hold home meds for tonight  Start on D10 CBG q 2h and D50 prn  HIV infection ([place]) Check CD4 count and HIV viral load continue Genvoya  History of suicidal ideation History of suicidal ideations Patient at this time denies suicidal ideation Continue to monitor Check salicylate level acetaminophen level and U tox He still has flat affect Would benefit from behavioral health consult  Essential hypertension Allow permissive hypertension for tonight  Hypocalcemia Will replace Check ionized calcium   Other plan as per orders.  DVT prophylaxis:  SCD        Code Status:  DNR/DNI   as per patient   I had personally  discussed CODE  STATUS with patient at length  Pt confirms he is not endorsing any suicidal ideations at this time She strongly wish to be DNR/DNI Pt is alert and oriented and his BG is >100 at this time Family Communication:   Family not at  Bedside     Disposition Plan:     To home once workup is complete and patient is stable   Following barriers for discharge:                            Electrolytes corrected                                                  Diabetes care coordinator Behavioral health consult    Admission status:  ED Disposition     ED Disposition  [place]: [place] [[id number]]  Level of Care: Progressive [102]  Admit to Progressive based on following criteria: NEUROLOGICAL AND NEUROSURGICAL complex patients with significant risk of instability, who do not meet ICU criteria, yet require close observation or frequent assessment (< / = every 2 - 4 hours) with medical / nursing intervention.  May place patient in observation at [facility] or [place] if equivalent level of care is available:: No  Covid Evaluation: Confirmed COVID Negative  Diagnosis: Hypoglycemia [id number]  Admitting Physician: Toy Baker [[id number]]  Attending Physician: Toy Baker [[id number]]          Obs    Level of care         progressive tele indefinitely please discontinue once patient no longer qualifies COVID-19 Labs   Lab Results  Component Value Date   [place] 03/21/2022     Precautions: admitted as   Covid Negative         Aveyah Greenwood 03/21/2022, 12:29 AM    [facility]     after 2 AM please page floor coverage PA If 7AM-7PM, please contact the day team taking care of the patient using [email or web address]   Patient was evaluated in the context of the global COVID-19 pandemic, which necessitated consideration that the patient might be at risk for infection with the SARS-CoV-2 virus  that causes COVID-19. Institutional protocols and algorithms that pertain to the evaluation of patients at risk for COVID-19 are in a state of rapid change based on information released by regulatory bodies including the CDC and federal and state organizations. These policies and algorithms were followed during the patient's care.

## 2022-03-21 NOTE — Assessment & Plan Note (Signed)
Allow permissive hypertension for tonight 

## 2022-03-21 NOTE — ED Triage Notes (Signed)
The patient presents due to hypoglycemia. While at work the patient's blood glucose dropped. 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 After drinking 3 cups of OJ, his CBG was 68. When EMS arrived his CBG was 34 and he became unresponsive. D10 was given and his CBG rose to 129. 10 mins later the CBG dropped to 88. He is now more lethargic per EMS.    EMS vitals: 170/98 BP 78 HR 99% RA

## 2022-03-21 NOTE — Assessment & Plan Note (Addendum)
Diabetes education Hold home meds for tonight  Start on D10 CBG q 2h and D50 prn

## 2022-03-21 NOTE — Assessment & Plan Note (Signed)
Check CD4 count and HIV viral load continue Genvoya

## 2022-03-21 NOTE — Assessment & Plan Note (Signed)
Will replace Check ionized calcium

## 2022-03-21 NOTE — ED Provider Notes (Cosign Needed)
[place] EMERGENCY DEPARTMENT AT [facility] Provider Note   CSN: [id number] Arrival date & time: 03/21/22  1857     History Chief Complaint  Patient presents with   Hypoglycemia    [name] is a 37 y.o. male with history of type 1 diabetes hypertension, HIV, syphilis presents emerged from today for evaluation after hyperglycemia.  Patient was seen multiple times in the ER for this previously.  He reports that he had a low blood sugar last night and continued being low this morning.  He reports he did not eat till he got to work around 1400 today.  He did not take his long-acting and short acting insulin.  He reports that he started to feel lightheaded like his sugar was getting low.  He decided to drink orange juice and have a soda as well as some food.  He continued to feel more lightheaded.  He had a questionable syncopal episode where he reports that staff helped him to the ground.  [place] fire and EMS there.  EMS reports that his blood sugar was 60 on scene and the patient became diaphoretic and his sugar was in the 30s so they gave him some D10.  Blood sugars have been slowly declining with the last 1 being in the 60s.  He reports that he just feels fatigued but otherwise does not have any nausea, vomiting, abdominal pain, chest pain, or shortness of breath.  The patient does have history of suicidal ideations with an attempt from insulin overdose.  Patient is adamant that he is not suicidal or that this was an attempt.  He reports "I would not try to do it at work, it was embarrassing to pass out while at work".   Hypoglycemia Associated symptoms: no shortness of breath and no vomiting        Home Medications Prior to Admission medications   Medication Sig Start Date End Date Taking? Authorizing Provider  amLODipine (NORVASC) 10 MG tablet Take 1 tablet (10 mg total) by mouth daily. For high blood pressure 02/10/22 05/15/22  Storm Frisk, MD  blood  glucose meter kit and supplies Dispense based on patient and insurance preference. Use up to four times daily as directed: For blood sugar monitoring 10/08/20   Armandina Stammer I, NP  elvitegravir-cobicistat-emtricitabine-tenofovir (GENVOYA) 150-150-200-10 MG TABS tablet Take 1 tablet by mouth daily with breakfast. 02/03/22   Kathlynn Grate, DO  glucose blood (TRUE METRIX BLOOD GLUCOSE TEST) test strip use up to 4 times daily as directed for blood sugar monitoring 11/18/21   Storm Frisk, MD  hydrALAZINE (APRESOLINE) 25 MG tablet Take 1 tablet (25 mg total) by mouth every 8 (eight) hours. For hypertension 02/10/22   Storm Frisk, MD  Insulin Glargine [facility]) 100 UNIT/ML Inject 22 Units into the skin daily. 02/10/22   Storm Frisk, MD  insulin lispro (HUMALOG) 100 UNIT/ML KwikPen Inject 10 Units into the skin 2 (two) times daily before lunch and supper. Hold if blood sugar less than 150 02/10/22   Storm Frisk, MD  Insulin Pen Needle 31G X 5 MM MISC Use with insulin pen 02/10/22   Storm Frisk, MD  sertraline (ZOLOFT) 100 MG tablet Take 1 tablet (100 mg total) by mouth daily. 02/10/22   Storm Frisk, MD  sodium fluoride (PREVIDENT 5000 PLUS) 1.1 % CREA dental cream Brush on teeth with a toothbrush after evening mouth care. Spit out excess and do not rinse Patient  not taking: Reported on 02/10/2022 01/28/21     valsartan-hydrochlorothiazide (DIOVAN-HCT) 320-25 MG tablet Take 1 tablet by mouth daily. 02/10/22   Storm Frisk, MD  pravastatin (PRAVACHOL) 20 MG tablet Take 1 tablet (20 mg total) by mouth daily. Patient not taking: Reported on 03/31/2019 03/08/19 07/08/19  Claiborne Rigg, NP      Allergies    Shellfish allergy and Aspirin    Review of Systems   Review of Systems  Constitutional:  Negative for chills and fever.  Respiratory:  Negative for shortness of breath.   Cardiovascular:  Negative for chest pain.  Gastrointestinal:  Negative for abdominal  pain, constipation, diarrhea, nausea and vomiting.  Genitourinary:  Negative for dysuria and hematuria.  Neurological:  Positive for syncope and light-headedness.  Psychiatric/Behavioral:  Negative for suicidal ideas.     Physical Exam Updated Vital Signs BP (!) 149/96   Pulse 73   Temp 97.6 F (36.4 C) (Oral)   Resp 19   SpO2 100%  Physical Exam Vitals and nursing note reviewed.  Constitutional:      General: He is not in acute distress.    Appearance: He is not toxic-appearing.  HENT:     Mouth/Throat:     Mouth: Mucous membranes are moist.  Cardiovascular:     Rate and Rhythm: Normal rate.  Pulmonary:     Effort: Pulmonary effort is normal. No respiratory distress.     Breath sounds: Normal breath sounds.  Abdominal:     Tenderness: There is no abdominal tenderness. There is no guarding or rebound.  Skin:    General: Skin is warm and dry.     Capillary Refill: Capillary refill takes less than 2 seconds.  Neurological:     General: No focal deficit present.     Mental Status: He is alert. Mental status is at baseline.     ED Results / Procedures / Treatments   Labs (all labs ordered are listed, but only abnormal results are displayed) Labs Reviewed  CBC WITH DIFFERENTIAL/PLATELET - Abnormal; Notable for the following components:      Result Value   WBC 11.0 (*)    RBC 4.19 (*)    Neutro Abs 8.7 (*)    All other components within normal limits  COMPREHENSIVE METABOLIC PANEL - Abnormal; Notable for the following components:   Chloride 116 (*)    CO2 17 (*)    Glucose, Bld 125 (*)    Calcium 6.4 (*)    Total Protein 4.9 (*)    Albumin 2.7 (*)    Alkaline Phosphatase 27 (*)    All other components within normal limits  CBG MONITORING, ED - Abnormal; Notable for the following components:   Glucose-Capillary 69 (*)    All other components within normal limits  CBG MONITORING, ED - Abnormal; Notable for the following components:   Glucose-Capillary 108 (*)     All other components within normal limits  RESP PANEL BY RT-PCR (RSV, FLU A&B, COVID)  RVPGX2  URINALYSIS, ROUTINE W REFLEX MICROSCOPIC  RAPID URINE DRUG SCREEN, HOSP PERFORMED  CBG MONITORING, ED    EKG None  Radiology DG Chest 2 View  Result Date: 03/21/2022 CLINICAL DATA:  Cough EXAM: CHEST - 2 VIEW COMPARISON:  02/19/2022 FINDINGS: The heart size and mediastinal contours are within normal limits. Both lungs are clear. The visualized skeletal structures are unremarkable. IMPRESSION: No active cardiopulmonary disease. Electronically Signed   By: Jasmine Pang M.D.   On: 03/21/2022 21:14  Procedures .Critical Care  Performed by: Sherrell Puller, PA-C Authorized by: Sherrell Puller, PA-C   Critical care provider statement:    Critical care time (minutes):  60   Critical care was necessary to treat or prevent imminent or life-threatening deterioration of the following conditions:  Endocrine crisis   Critical care was time spent personally by me on the following activities:  Development of treatment plan with patient or surrogate, discussions with consultants, evaluation of patient's response to treatment, examination of patient, ordering and review of laboratory studies, ordering and review of radiographic studies, ordering and performing treatments and interventions, pulse oximetry, re-evaluation of patient's condition, review of old charts and obtaining history from patient or surrogate   Care discussed with: admitting provider      Medications Ordered in ED Medications  dextrose 10 % infusion ( Intravenous New Bag/Given 03/21/22 2157)  elvitegravir-cobicistat-emtricitabine-tenofovir (GENVOYA) 150-150-200-10 MG tablet 1 tablet (has no administration in time range)  sertraline (ZOLOFT) tablet 100 mg (has no administration in time range)  dextrose 50 % solution 50 mL (has no administration in time range)  calcium gluconate 1 g/ 50 mL sodium chloride IVPB (1,000 mg Intravenous New  Bag/Given 03/21/22 2341)  dextrose 50 % solution 50 mL (50 mLs Intravenous Given 03/21/22 2004)  dextrose 50 % solution 50 mL (50 mLs Intravenous Given 03/21/22 2156)    ED Course/ Medical Decision Making/ A&P                           Medical Decision Making Amount and/or Complexity of Data Reviewed Labs: ordered. Radiology: ordered.  Risk Prescription drug management. Decision regarding hospitalization.   37 year old male presents emerged from today for evaluation of hypoglycemia.  Differential diagnosis includes was limited to type 1 diabetes, hypoglycemia, insulin overdose.  Vital signs show blood pressure mildly elevated otherwise afebrile, normal pulse rate, satting well room air without increased work of breathing.  Physical exam as noted above.  I independently reviewed and interpreted the patient's labs.  Magnesium within normal limits.  Lactic acid negative.  CK within normal limits.  Respiratory panel negative for COVID, flu, RSV.  CMP does show chloride at 116, bicarb at 17 however normal gap.  Glucose is at 125.  Patient does have significant hypocalcemia which is new.  He does have a decrease in albumin at 2.7 which puts his new calcium around 7.2, still on the lower side.  CBC does show a slight increased white blood cell count of 11 with a left shift however could be stress reaction to the patient's hyperglycemia.  No anemia.  I ordered an initial D50 and given the patient's continued drops with his blood sugar even after the D10 bag infusion that he was given with EMS.  On previous chart evaluation, patient was seen on 02/19/22 for hypoglycemia.  He was also endorsing some possible passive suicidal ideations and was IVCs however eloped prior to the IVC paperwork being filed.  I have had a long discussion with the patient and he is adamant that he does not feel suicidal or have any thoughts of killing himself.  He is currently taking his medication for his major depressive  disorder.  Delayed and scheduled monitoring for CBG.  Unfortunately after the D50 the patient dropped down to the 40s again.  I ordered another amp of D50 as well as a D10 infusion for his refractory hypoglycemia.  Given his drops, he will need to be admitted for further  evaluation and monitoring.  I do feel he would also benefit from some diabetes education as well.  Admit to [facility] hospitalist.  Final Clinical Impression(s) / ED Diagnoses Final diagnoses:  Hypoglycemia  Hypocalcemia    Rx / DC Orders ED Discharge Orders     None         Sherrell Puller, PA-C 03/22/22 0030

## 2022-03-21 NOTE — Subjective & Objective (Signed)
Pt was at work when his BG dropped Was given 3 cups of OJ CBG went up to 68 By the time EMS arrived back down to  34 and he became unresponsive After d10 BG up to 129 but only after 10 min back to 88 and pt symptomatic Pt with Type 1 DM

## 2022-03-21 NOTE — Assessment & Plan Note (Signed)
Given persistent hypoglycemia hold long lasting insulin Monitor BG and cover as  as needed

## 2022-03-22 ENCOUNTER — Ambulatory Visit: Payer: Self-pay | Admitting: Pharmacist

## 2022-03-22 LAB — CBG MONITORING, ED
Glucose-Capillary: 174 mg/dL — ABNORMAL HIGH (ref 70–99)
Glucose-Capillary: 60 mg/dL — ABNORMAL LOW (ref 70–99)
Glucose-Capillary: 89 mg/dL (ref 70–99)

## 2022-03-22 MED ORDER — DEXTROSE 50 % IV SOLN
1.0000 | Freq: Once | INTRAVENOUS | Status: AC
  Administered 2022-03-22: 50 mL via INTRAVENOUS
  Filled 2022-03-22: qty 50

## 2022-03-22 NOTE — ED Notes (Addendum)
OJ and sandwich given

## 2022-03-22 NOTE — ED Notes (Signed)
Pt advised that he should stay, pt wants to leave.

## 2022-03-22 NOTE — Progress Notes (Signed)
                                                  Against Medical Advice Patient at this time expresses desire to leave the Hospital immediately, patient has been warned that this is not Medically advisable at this time, and can result in Medical complications like Death and Disability, patient understands and accepts the risks involved and assumes full responsibilty of this decision.  This patient has also been advised that if they feel the need for further medical assistance to return to any available ER or dial [phone].  Informed by Nursing staff that this patient has left care and has signed the form  Against Medical Advice on 03/22/2022 at 0517 hrs.  Gershon Cull BSN MSNA MSN ACNPC-AG Acute Care Nurse Practitioner [place]

## 2022-03-22 NOTE — ED Notes (Signed)
Patient spoke with Olena Heckle NP about wanting to leave. Patient educated about risk of leaving AMA and verbalized understanding for return precautions.

## 2022-04-07 ENCOUNTER — Ambulatory Visit: Payer: Self-pay | Admitting: Pharmacist

## 2022-04-12 ENCOUNTER — Ambulatory Visit: Payer: Self-pay | Admitting: Pharmacist

## 2022-04-21 ENCOUNTER — Ambulatory Visit: Payer: Self-pay | Admitting: Critical Care Medicine

## 2022-04-27 ENCOUNTER — Other Ambulatory Visit: Payer: Self-pay

## 2022-04-29 ENCOUNTER — Other Ambulatory Visit: Payer: Self-pay

## 2022-05-11 ENCOUNTER — Ambulatory Visit: Payer: Self-pay | Admitting: Critical Care Medicine

## 2022-05-11 NOTE — Progress Notes (Deleted)
Established patient Office Visit  Subjective    Patient ID: [name], male    DOB: [birth year]  Age: 37 y.o. MRN: [id number]  CC:  No chief complaint on file.   HPI 10/18/21 [name] presents to establish care This is a former patient in our clinic last seen in 2021 by Ms. Fleming for type 1 diabetes and diabetic polyneuropathy.  The patient unfortunately is now homeless but comes in today to reestablish.  On arrival blood sugar 222 A1c 8.1 blood pressure 175/103.  The patient has been homeless now for several months.  He does have access to 70/30 insulin which she uses an insulin syringe for.  He has no other medications.  He does suffer from severe mental health issues with depression he feels like he can go no further and has been having active suicidal ideation.  Apparently he has been seen for this problem in the past.  He was on citalopram in the past but is off this medication.  The patient needs a complete health screening at this visit  Patient does have numbness in the feet.  Patient also is followed by the HIV clinic below is documentation for the last visit in June.  He has been compliant with his HIV medications.  His labs in August 2022 showed improvement in his numbers. The biggest issue continues to be depression and ongoing chronic suicidal ideation.  See assessment below note the patient does drink 2 beers daily.  He does not smoke cigarettes nor does he use other substances.  12/14 Patient returns in follow-up for type I diet [name] severe depression and hypertension.  The patient has not been taking insulin regularly and did not take any this morning and did not eat today comes in with a blood sugar 378 A1c of 9.8  Patient also has had significant depression with chronic suicidal ideation but no clear plan.  At the last visit we try to get him over to [facility] behavioral health with the police escort but he left before the police  arrived.  There is no record of any emergency room visits for suicidal attempts since that visit in August.  Patient does stay chronically depressed did not fill the Celexa be prescribed.  Blood pressure currently is controlled on amlodipine valsartan HCT and hydralazine.  Blood pressure on arrival 122/71  05/11/22  Outpatient Encounter Medications as of 05/11/2022  Medication Sig   amLODipine (NORVASC) 10 MG tablet Take 1 tablet (10 mg total) by mouth daily. For high blood pressure (Patient not taking: Reported on 03/21/2022)   blood glucose meter kit and supplies Dispense based on patient and insurance preference. Use up to four times daily as directed: For blood sugar monitoring   elvitegravir-cobicistat-emtricitabine-tenofovir (GENVOYA) 150-150-200-10 MG TABS tablet Take 1 tablet by mouth daily with breakfast.   glucose blood (TRUE METRIX BLOOD GLUCOSE TEST) test strip use up to 4 times daily as directed for blood sugar monitoring   hydrALAZINE (APRESOLINE) 25 MG tablet Take 1 tablet (25 mg total) by mouth every 8 (eight) hours. For hypertension   Insulin Glargine (BASAGLAR KWIKPEN) 100 UNIT/ML Inject 22 Units into the skin daily.   insulin lispro (HUMALOG) 100 UNIT/ML KwikPen Inject 10 Units into the skin 2 (two) times daily before lunch and supper. Hold if blood sugar less than 150   Insulin Pen Needle 31G X 5 MM MISC Use with insulin pen   sertraline (ZOLOFT) 100 MG tablet Take 1 tablet (100 mg  total) by mouth daily. (Patient not taking: Reported on 03/21/2022)   sodium fluoride (PREVIDENT 5000 PLUS) 1.1 % CREA dental cream Brush on teeth with a toothbrush after evening mouth care. Spit out excess and do not rinse (Patient not taking: Reported on 02/10/2022)   valsartan-hydrochlorothiazide (DIOVAN-HCT) 320-25 MG tablet Take 1 tablet by mouth daily.   [DISCONTINUED] pravastatin (PRAVACHOL) 20 MG tablet Take 1 tablet (20 mg total) by mouth daily. (Patient not taking: Reported on 03/31/2019)   No  facility-administered encounter medications on file as of 05/11/2022.    Past Medical History:  Diagnosis Date   Anxiety    Asthma    Closed displaced comminuted fracture of shaft of right tibia 11/01/2017   Closed fracture of base of fifth metatarsal bone of right foot 11/13/2017   Closed fracture of medial malleolus of right ankle 10/31/2017   Depression    Headache    "only w/stress" (11/01/2017)   HIV (human immunodeficiency virus infection) ([place])    Migraine    "a few/month" (11/01/2017)   Refusal of blood transfusions as patient is Jehovah's Witness    Seizure ([place])    "only w/my low blood sugars"  (11/01/2017)   Type I diabetes mellitus ([place])     Past Surgical History:  Procedure Laterality Date   FRACTURE SURGERY     IM NAILING TIBIA Right 11/01/2017   INTRAMEDULLARY (IM) NAIL TIBIALRightGeneral   ORIF ANKLE FRACTURE Right 11/01/2017   ORIF ANKLE FRACTURE Right 11/01/2017   Procedure: OPEN REDUCTION INTERNAL FIXATION (ORIF) ANKLE FRACTURE;  Surgeon: Shona Needles, MD;  Location: [place];  Service: Orthopedics;  Laterality: Right;   TIBIA IM NAIL INSERTION Right 11/01/2017   Procedure: INTRAMEDULLARY (IM) NAIL TIBIAL;  Surgeon: Shona Needles, MD;  Location: [place];  Service: Orthopedics;  Laterality: Right;    Family History  Problem Relation Age of Onset   Thyroid disease Mother    Diabetes Mother    Breast cancer Maternal Grandmother    Diabetes Maternal Grandmother     Social History   Socioeconomic History   Marital status: Single    Spouse name: Not on file   Number of children: Not on file   Years of education: Not on file   Highest education level: Not on file  Occupational History   Not on file  Tobacco Use   Smoking status: Some Days    Types: Cigars    Start date: 09/29/2015   Smokeless tobacco: Never   Tobacco comments:    11/01/2017  "2 cigarettes in ~ 6 month "  Vaping Use   Vaping Use: Former  Substance and Sexual Activity   Alcohol use: Yes     Alcohol/week: 7.0 standard drinks of alcohol    Types: 7 Cans of beer per week   Drug use: Yes    Frequency: 2.0 times per week    Types: Marijuana    Comment: 11/01/2017  "weekly"   Sexual activity: Yes    Partners: Male    Comment: pt. declined condoms  Other Topics Concern   Not on file  Social History Narrative   Not on file   Social Determinants of Health   Financial Resource Strain: Not on file  Food Insecurity: Not on file  Transportation Needs: Not on file  Physical Activity: Not on file  Stress: Not on file  Social Connections: Not on file  Intimate Partner Violence: Not on file    Review of Systems  Constitutional:  Negative  for chills, diaphoresis, fever, malaise/fatigue and weight loss.  HENT:  Negative for congestion, hearing loss, nosebleeds, sore throat and tinnitus.   Eyes:  Negative for blurred vision, photophobia and redness.  Respiratory:  Negative for cough, hemoptysis, sputum production, shortness of breath, wheezing and stridor.   Cardiovascular:  Negative for chest pain, palpitations, orthopnea, claudication, leg swelling and PND.  Gastrointestinal:  Negative for abdominal pain, blood in stool, constipation, diarrhea, heartburn, nausea and vomiting.  Genitourinary:  Negative for dysuria, flank pain, frequency, hematuria and urgency.  Musculoskeletal:  Negative for back pain, falls, joint pain, myalgias and neck pain.  Skin:  Negative for itching and rash.  Neurological:  Positive for sensory change. Negative for dizziness, tingling, tremors, speech change, focal weakness, seizures, loss of consciousness, weakness and headaches.  Endo/Heme/Allergies:  Negative for environmental allergies and polydipsia. Does not bruise/bleed easily.  Psychiatric/Behavioral:  Positive for depression and suicidal ideas. Negative for memory loss and substance abuse. The patient is not nervous/anxious and does not have insomnia.         Objective    There were no vitals  taken for this visit.  Physical Exam Vitals reviewed.  Constitutional:      Appearance: Normal appearance. He is well-developed. He is not diaphoretic.  HENT:     Head: Normocephalic and atraumatic.     Nose: No nasal deformity, septal deviation, mucosal edema or rhinorrhea.     Right Sinus: No maxillary sinus tenderness or frontal sinus tenderness.     Left Sinus: No maxillary sinus tenderness or frontal sinus tenderness.     Mouth/Throat:     Pharynx: No oropharyngeal exudate.  Eyes:     General: No scleral icterus.    Conjunctiva/sclera: Conjunctivae normal.     Pupils: Pupils are equal, round, and reactive to light.  Neck:     Thyroid: No thyromegaly.     Vascular: No carotid bruit or JVD.     Trachea: Trachea normal. No tracheal tenderness or tracheal deviation.  Cardiovascular:     Rate and Rhythm: Normal rate and regular rhythm.     Chest Wall: PMI is not displaced.     Pulses: Normal pulses. No decreased pulses.     Heart sounds: Normal heart sounds, S1 normal and S2 normal. Heart sounds not distant. No murmur heard.    No systolic murmur is present.     No diastolic murmur is present.     No friction rub. No gallop. No S3 or S4 sounds.  Pulmonary:     Effort: No tachypnea, accessory muscle usage or respiratory distress.     Breath sounds: No stridor. No decreased breath sounds, wheezing, rhonchi or rales.  Chest:     Chest wall: No tenderness.  Abdominal:     General: Bowel sounds are normal. There is no distension.     Palpations: Abdomen is soft. Abdomen is not rigid.     Tenderness: There is no abdominal tenderness. There is no guarding or rebound.  Musculoskeletal:        General: Normal range of motion.     Cervical back: Normal range of motion and neck supple. No edema, erythema or rigidity. No muscular tenderness. Normal range of motion.  Lymphadenopathy:     Head:     Right side of head: No submental or submandibular adenopathy.     Left side of head: No  submental or submandibular adenopathy.     Cervical: No cervical adenopathy.  Skin:    General:  Skin is warm and dry.     Coloration: Skin is not pale.     Findings: No rash.     Nails: There is no clubbing.  Neurological:     Mental Status: He is alert and oriented to person, place, and time.     Sensory: No sensory deficit.  Psychiatric:        Attention and Perception: He is inattentive.        Mood and Affect: Mood is depressed. Affect is tearful.        Speech: Speech is delayed.        Behavior: Behavior is withdrawn. Behavior is not combative.        Thought Content: Thought content includes suicidal ideation. Thought content does not include homicidal ideation. Thought content does not include homicidal or suicidal plan.        Cognition and Memory: Cognition and memory normal.        Judgment: Judgment normal. Judgment is not impulsive or inappropriate.    No evidence of cuts on the skin that are recent     Assessment & Plan:   Problem List Items Addressed This Visit   None 35 minutes spent excess time needed to assess suicidal ideation No follow-ups on file.   Asencion Noble, MD

## 2022-05-25 ENCOUNTER — Ambulatory Visit: Payer: Self-pay | Attending: Critical Care Medicine | Admitting: Critical Care Medicine

## 2022-05-25 NOTE — Progress Notes (Deleted)
Established patient Office Visit  Subjective    Patient ID: [name], male    DOB: 13-Sep-1985  Age: 37 y.o. MRN: [id number]  CC:  No chief complaint on file.   HPI 10/18/21 Ruble Danirus Adams presents to establish care This is a former patient in our clinic last seen in 2021 by Ms. Fleming for type 1 diabetes and diabetic polyneuropathy.  The patient unfortunately is now homeless but comes in today to reestablish.  On arrival blood sugar 222 A1c 8.1 blood pressure 175/103.  The patient has been homeless now for several months.  He does have access to 70/30 insulin which she uses an insulin syringe for.  He has no other medications.  He does suffer from severe mental health issues with depression he feels like he can go no further and has been having active suicidal ideation.  Apparently he has been seen for this problem in the past.  He was on citalopram in the past but is off this medication.  The patient needs a complete health screening at this visit  Patient does have numbness in the feet.  Patient also is followed by the HIV clinic below is documentation for the last visit in June.  He has been compliant with his HIV medications.  His labs in August 2022 showed improvement in his numbers. The biggest issue continues to be depression and ongoing chronic suicidal ideation.  See assessment below note the patient does drink 2 beers daily.  He does not smoke cigarettes nor does he use other substances.  12/14 Patient returns in follow-up for type I diet [name] severe depression and hypertension.  The patient has not been taking insulin regularly and did not take any this morning and did not eat today comes in with a blood sugar 378 A1c of 9.8  Patient also has had significant depression with chronic suicidal ideation but no clear plan.  At the last visit we try to get him over to [facility] behavioral health with the police escort but he left before the police  arrived.  There is no record of any emergency room visits for suicidal attempts since that visit in August.  Patient does stay chronically depressed did not fill the Celexa be prescribed.  Blood pressure currently is controlled on amlodipine valsartan HCT and hydralazine.  Blood pressure on arrival 122/71   05/25/22 Two ED visits for hypoglycemia: Last one 03/21/22 [name] is a 37 y.o. male with history of type 1 diabetes hypertension, HIV, syphilis presents emerged from today for evaluation after hyperglycemia.  Patient was seen multiple times in the ER for this previously.  He reports that he had a low blood sugar last night and continued being low this morning.  He reports he did not eat till he got to work around 1400 today.  He did not take his long-acting and short acting insulin.  He reports that he started to feel lightheaded like his sugar was getting low.  He decided to drink orange juice and have a soda as well as some food.  He continued to feel more lightheaded.  He had a questionable syncopal episode where he reports that staff helped him to the ground.  [place] [facility] and EMS there.  EMS reports that his blood sugar was 60 on scene and the patient became diaphoretic and his sugar was in the 30s so they gave him some D10.  Blood sugars have been slowly declining with the last 1 being in  the 60s.  He reports that he just feels fatigued but otherwise does not have any nausea, vomiting, abdominal pain, chest pain, or shortness of breath.  The patient does have history of suicidal ideations with an attempt from insulin overdose.  Patient is adamant that he is not suicidal or that this was an attempt.  He reports "I would not try to do it at work, it was embarrassing to pass out while at work".   37 year old male presents emerged from today for evaluation of hypoglycemia.  Differential diagnosis includes was limited to type 1 diabetes, hypoglycemia, insulin overdose.  Vital signs  show blood pressure mildly elevated otherwise afebrile, normal pulse rate, satting well room air without increased work of breathing.  Physical exam as noted above.   I independently reviewed and interpreted the patient's labs.  Magnesium within normal limits.  Lactic acid negative.  CK within normal limits.  Respiratory panel negative for COVID, flu, RSV.  CMP does show chloride at 116, bicarb at 17 however normal gap.  Glucose is at 125.  Patient does have significant hypocalcemia which is new.  He does have a decrease in albumin at 2.7 which puts his new calcium around 7.2, still on the lower side.  CBC does show a slight increased white blood cell count of 11 with a left shift however could be stress reaction to the patient's hyperglycemia.  No anemia.   I ordered an initial D50 and given the patient's continued drops with his blood sugar even after the D10 bag infusion that he was given with EMS.   On previous chart evaluation, patient was seen on 02/19/22 for hypoglycemia.  He was also endorsing some possible passive suicidal ideations and was IVCs however eloped prior to the IVC paperwork being filed.  I have had a long discussion with the patient and he is adamant that he does not feel suicidal or have any thoughts of killing himself.  He is currently taking his medication for his major depressive disorder.   Delayed and scheduled monitoring for CBG.  Unfortunately after the D50 the patient dropped down to the 40s again.  I ordered another amp of D50 as well as a D10 infusion for his refractory hypoglycemia.  Given his drops, he will need to be admitted for further evaluation and monitoring.  I do feel he would also benefit from some diabetes education as well.   Admit to [facility] hospitalist.                                               Against Medical Advice Patient at this time expresses desire to leave the Hospital immediately, patient has been warned that this is not Medically advisable at  this time, and can result in Medical complications like Death and Disability, patient understands and accepts the risks involved and assumes full responsibilty of this decision.   This patient has also been advised that if they feel the need for further medical assistance to return to any available ER or dial 9-1-1.   Informed by Nursing staff that this patient has left care and has signed the form  Against Medical Advice on 03/22/2022 at 0517 hrs.   Gershon Cull BSN MSNA MSN ACNPC-AG Acute Care Nurse Practitioner [facility]       Outpatient Encounter Medications as of 05/25/2022  Medication Sig  amLODipine (NORVASC) 10 MG tablet Take 1 tablet (10 mg total) by mouth daily. For high blood pressure (Patient not taking: Reported on 03/21/2022)   blood glucose meter kit and supplies Dispense based on patient and insurance preference. Use up to four times daily as directed: For blood sugar monitoring   elvitegravir-cobicistat-emtricitabine-tenofovir (GENVOYA) 150-150-200-10 MG TABS tablet Take 1 tablet by mouth daily with breakfast.   glucose blood (TRUE METRIX BLOOD GLUCOSE TEST) test strip use up to 4 times daily as directed for blood sugar monitoring   hydrALAZINE (APRESOLINE) 25 MG tablet Take 1 tablet (25 mg total) by mouth every 8 (eight) hours. For hypertension   Insulin Glargine (BASAGLAR KWIKPEN) 100 UNIT/ML Inject 22 Units into the skin daily.   insulin lispro (HUMALOG) 100 UNIT/ML KwikPen Inject 10 Units into the skin 2 (two) times daily before lunch and supper. Hold if blood sugar less than 150   Insulin Pen Needle 31G X 5 MM MISC Use with insulin pen   sertraline (ZOLOFT) 100 MG tablet Take 1 tablet (100 mg total) by mouth daily. (Patient not taking: Reported on 03/21/2022)   sodium fluoride (PREVIDENT 5000 PLUS) 1.1 % CREA dental cream Brush on teeth with a toothbrush after evening mouth care. Spit out excess and do not rinse (Patient not taking: Reported on  02/10/2022)   valsartan-hydrochlorothiazide (DIOVAN-HCT) 320-25 MG tablet Take 1 tablet by mouth daily.   [DISCONTINUED] pravastatin (PRAVACHOL) 20 MG tablet Take 1 tablet (20 mg total) by mouth daily. (Patient not taking: Reported on 03/31/2019)   No facility-administered encounter medications on file as of 05/25/2022.    Past Medical History:  Diagnosis Date   Anxiety    Asthma    Closed displaced comminuted fracture of shaft of right tibia 11/01/2017   Closed fracture of base of fifth metatarsal bone of right foot 11/13/2017   Closed fracture of medial malleolus of right ankle 10/31/2017   Depression    Headache    "only w/stress" (11/01/2017)   HIV (human immunodeficiency virus infection) ([place])    Migraine    "a few/month" (11/01/2017)   Refusal of blood transfusions as patient is Jehovah's Witness    Seizure ([place])    "only w/my low blood sugars"  (11/01/2017)   Type I diabetes mellitus ([place])     Past Surgical History:  Procedure Laterality Date   FRACTURE SURGERY     IM NAILING TIBIA Right 11/01/2017   INTRAMEDULLARY (IM) NAIL TIBIALRightGeneral   ORIF ANKLE FRACTURE Right 11/01/2017   ORIF ANKLE FRACTURE Right 11/01/2017   Procedure: OPEN REDUCTION INTERNAL FIXATION (ORIF) ANKLE FRACTURE;  Surgeon: Shona Needles, MD;  Location: [place];  Service: Orthopedics;  Laterality: Right;   TIBIA IM NAIL INSERTION Right 11/01/2017   Procedure: INTRAMEDULLARY (IM) NAIL TIBIAL;  Surgeon: Shona Needles, MD;  Location: [place];  Service: Orthopedics;  Laterality: Right;    Family History  Problem Relation Age of Onset   Thyroid disease Mother    Diabetes Mother    Breast cancer Maternal Grandmother    Diabetes Maternal Grandmother     Social History   Socioeconomic History   Marital status: Single    Spouse name: Not on file   Number of children: Not on file   Years of education: Not on file   Highest education level: Not on file  Occupational History   Not on file  Tobacco Use    Smoking status: Some Days    Types: Cigars  Start date: 09/29/2015   Smokeless tobacco: Never   Tobacco comments:    11/01/2017  "2 cigarettes in ~ 6 month "  Vaping Use   Vaping Use: Former  Substance and Sexual Activity   Alcohol use: Yes    Alcohol/week: 7.0 standard drinks of alcohol    Types: 7 Cans of beer per week   Drug use: Yes    Frequency: 2.0 times per week    Types: Marijuana    Comment: 11/01/2017  "weekly"   Sexual activity: Yes    Partners: Male    Comment: pt. declined condoms  Other Topics Concern   Not on file  Social History Narrative   Not on file   Social Determinants of Health   Financial Resource Strain: Not on file  Food Insecurity: Not on file  Transportation Needs: Not on file  Physical Activity: Not on file  Stress: Not on file  Social Connections: Not on file  Intimate Partner Violence: Not on file    Review of Systems  Constitutional:  Negative for chills, diaphoresis, fever, malaise/fatigue and weight loss.  HENT:  Negative for congestion, hearing loss, nosebleeds, sore throat and tinnitus.   Eyes:  Negative for blurred vision, photophobia and redness.  Respiratory:  Negative for cough, hemoptysis, sputum production, shortness of breath, wheezing and stridor.   Cardiovascular:  Negative for chest pain, palpitations, orthopnea, claudication, leg swelling and PND.  Gastrointestinal:  Negative for abdominal pain, blood in stool, constipation, diarrhea, heartburn, nausea and vomiting.  Genitourinary:  Negative for dysuria, flank pain, frequency, hematuria and urgency.  Musculoskeletal:  Negative for back pain, falls, joint pain, myalgias and neck pain.  Skin:  Negative for itching and rash.  Neurological:  Positive for sensory change. Negative for dizziness, tingling, tremors, speech change, focal weakness, seizures, loss of consciousness, weakness and headaches.  Endo/Heme/Allergies:  Negative for environmental allergies and polydipsia. Does not  bruise/bleed easily.  Psychiatric/Behavioral:  Positive for depression and suicidal ideas. Negative for memory loss and substance abuse. The patient is not nervous/anxious and does not have insomnia.         Objective    There were no vitals taken for this visit.  Physical Exam Vitals reviewed.  Constitutional:      Appearance: Normal appearance. He is well-developed. He is not diaphoretic.  HENT:     Head: Normocephalic and atraumatic.     Nose: No nasal deformity, septal deviation, mucosal edema or rhinorrhea.     Right Sinus: No maxillary sinus tenderness or frontal sinus tenderness.     Left Sinus: No maxillary sinus tenderness or frontal sinus tenderness.     Mouth/Throat:     Pharynx: No oropharyngeal exudate.  Eyes:     General: No scleral icterus.    Conjunctiva/sclera: Conjunctivae normal.     Pupils: Pupils are equal, round, and reactive to light.  Neck:     Thyroid: No thyromegaly.     Vascular: No carotid bruit or JVD.     Trachea: Trachea normal. No tracheal tenderness or tracheal deviation.  Cardiovascular:     Rate and Rhythm: Normal rate and regular rhythm.     Chest Wall: PMI is not displaced.     Pulses: Normal pulses. No decreased pulses.     Heart sounds: Normal heart sounds, S1 normal and S2 normal. Heart sounds not distant. No murmur heard.    No systolic murmur is present.     No diastolic murmur is present.  No friction rub. No gallop. No S3 or S4 sounds.  Pulmonary:     Effort: No tachypnea, accessory muscle usage or respiratory distress.     Breath sounds: No stridor. No decreased breath sounds, wheezing, rhonchi or rales.  Chest:     Chest wall: No tenderness.  Abdominal:     General: Bowel sounds are normal. There is no distension.     Palpations: Abdomen is soft. Abdomen is not rigid.     Tenderness: There is no abdominal tenderness. There is no guarding or rebound.  Musculoskeletal:        General: Normal range of motion.     Cervical  back: Normal range of motion and neck supple. No edema, erythema or rigidity. No muscular tenderness. Normal range of motion.  Lymphadenopathy:     Head:     Right side of head: No submental or submandibular adenopathy.     Left side of head: No submental or submandibular adenopathy.     Cervical: No cervical adenopathy.  Skin:    General: Skin is warm and dry.     Coloration: Skin is not pale.     Findings: No rash.     Nails: There is no clubbing.  Neurological:     Mental Status: He is alert and oriented to person, place, and time.     Sensory: No sensory deficit.  Psychiatric:        Attention and Perception: He is inattentive.        Mood and Affect: Mood is depressed. Affect is tearful.        Speech: Speech is delayed.        Behavior: Behavior is withdrawn. Behavior is not combative.        Thought Content: Thought content includes suicidal ideation. Thought content does not include homicidal ideation. Thought content does not include homicidal or suicidal plan.        Cognition and Memory: Cognition and memory normal.        Judgment: Judgment normal. Judgment is not impulsive or inappropriate.    No evidence of cuts on the skin that are recent     Assessment & Plan:   Problem List Items Addressed This Visit   None 35 minutes spent excess time needed to assess suicidal ideation No follow-ups on file.   Asencion Noble, MD

## 2022-06-20 ENCOUNTER — Other Ambulatory Visit: Payer: Self-pay

## 2022-06-21 ENCOUNTER — Other Ambulatory Visit: Payer: Self-pay | Admitting: Critical Care Medicine

## 2022-06-21 ENCOUNTER — Other Ambulatory Visit: Payer: Self-pay

## 2022-06-21 NOTE — Telephone Encounter (Signed)
Requested medication (s) are due for refill today - expired Rx  Requested medication (s) are on the active medication list -yes  Future visit scheduled -no  Last refill: 01/28/21 51g 3RF  Notes to clinic: expired Rx  Requested Prescriptions  Pending Prescriptions Disp Refills   sodium fluoride (SODIUM FLUORIDE 5000 PLUS) 1.1 % CREA dental cream 51 g 3    Sig: Brush on teeth with a toothbrush after evening mouth care. Spit out excess and do not rinse     Endocrinology:  Minerals - sodium fluoride Failed - 06/21/2022 10:07 AM      Failed - Manual Review: Ensure dose is appropriate for patient's age if <75 years old      Passed - Cr in normal range and within 360 days    Creat  Date Value Ref Range Status  10/21/2020 1.27 (H) 0.60 - 1.26 mg/dL Final   Creatinine, Ser  Date Value Ref Range Status  03/21/2022 0.97 0.61 - 1.24 mg/dL Final         Passed - eGFR is 20 or above and within 360 days    GFR, Est African American  Date Value Ref Range Status  03/10/2020 104 > OR = 60 mL/min/1.24m2 Final   GFR, Est Non African American  Date Value Ref Range Status  03/10/2020 90 > OR = 60 mL/min/1.67m2 Final   GFR, Estimated  Date Value Ref Range Status  03/21/2022 >60 >60 mL/min Final    Comment:    (NOTE) Calculated using the CKD-EPI Creatinine Equation (2021)          Passed - Valid encounter within last 12 months    Recent Outpatient Visits           4 months 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[facility])   [place] [facility] Storm Frisk, MD   8 months 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[facility])   [place] [facility] Storm Frisk, MD   1 year ago Encounter to establish care   [facility], Amy J, NP   3 years ago Type 1 diabetes mellitus with diabetic polyneuropathy [facility])   [place] [facility] [place], Shea Stakes, NP        Future Appointments             In 1 month Kathlynn Grate, DO [facility]               Requested Prescriptions  Pending Prescriptions Disp Refills   sodium fluoride (SODIUM FLUORIDE 5000 PLUS) 1.1 % CREA dental cream 51 g 3    Sig: Brush on teeth with a toothbrush after evening mouth care. Spit out excess and do not rinse     Endocrinology:  Minerals - sodium fluoride Failed - 06/21/2022 10:07 AM      Failed - Manual Review: Ensure dose is appropriate for patient's age if <41 years old      Passed - Cr in normal range and within 360 days    Creat  Date Value Ref Range Status  10/21/2020 1.27 (H) 0.60 - 1.26 mg/dL Final   Creatinine, Ser  Date Value Ref Range Status  03/21/2022 0.97 0.61 - 1.24 mg/dL Final         Passed - eGFR is 20 or above and within 360 days    GFR, Est African  American  Date Value Ref Range Status  03/10/2020 104 > OR = 60 mL/min/1.28m2 Final   GFR, Est Non African American  Date Value Ref Range Status  03/10/2020 90 > OR = 60 mL/min/1.52m2 Final   GFR, Estimated  Date Value Ref Range Status  03/21/2022 >60 >60 mL/min Final    Comment:    (NOTE) Calculated using the CKD-EPI Creatinine Equation (2021)          Passed - Valid encounter within last 12 months    Recent Outpatient Visits           4 months 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[facility])   [place] [facility] Storm Frisk, MD   8 months 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[facility])   [place] [facility] Storm Frisk, MD   1 year ago Encounter to establish care   [facility], Amy J, NP   3 years ago Type 1 diabetes mellitus with diabetic polyneuropathy [facility])   [place] [facility] Claiborne Rigg, NP       Future Appointments             In 1 month Kathlynn Grate, DO [facility]

## 2022-06-26 ENCOUNTER — Other Ambulatory Visit: Payer: Self-pay

## 2022-06-26 ENCOUNTER — Emergency Department (HOSPITAL_COMMUNITY): Payer: BLUE CROSS/BLUE SHIELD

## 2022-06-26 ENCOUNTER — Inpatient Hospital Stay (HOSPITAL_COMMUNITY)
Admission: EM | Admit: 2022-06-26 | Discharge: 2022-06-28 | DRG: 638 | Disposition: A | Discharge status: Home / Self Care | Payer: BLUE CROSS/BLUE SHIELD | Attending: Student in an Organized Health Care Education/Training Program | Admitting: Student in an Organized Health Care Education/Training Program

## 2022-06-26 ENCOUNTER — Encounter (HOSPITAL_COMMUNITY): Payer: Self-pay | Admitting: Emergency Medicine

## 2022-06-26 DIAGNOSIS — Z66 Do not resuscitate: Secondary | ICD-10-CM | POA: Diagnosis present

## 2022-06-26 DIAGNOSIS — R319 Hematuria, unspecified: Secondary | ICD-10-CM | POA: Diagnosis present

## 2022-06-26 DIAGNOSIS — E10649 Type 1 diabetes mellitus with hypoglycemia without coma: Secondary | ICD-10-CM | POA: Diagnosis not present

## 2022-06-26 DIAGNOSIS — K029 Dental caries, unspecified: Secondary | ICD-10-CM | POA: Diagnosis present

## 2022-06-26 DIAGNOSIS — Z91013 Allergy to seafood: Secondary | ICD-10-CM

## 2022-06-26 DIAGNOSIS — Z8619 Personal history of other infectious and parasitic diseases: Secondary | ICD-10-CM

## 2022-06-26 DIAGNOSIS — R112 Nausea with vomiting, unspecified: Secondary | ICD-10-CM | POA: Diagnosis not present

## 2022-06-26 DIAGNOSIS — Z5989 Other problems related to housing and economic circumstances: Secondary | ICD-10-CM

## 2022-06-26 DIAGNOSIS — N179 Acute kidney failure, unspecified: Secondary | ICD-10-CM | POA: Diagnosis present

## 2022-06-26 DIAGNOSIS — E109 Type 1 diabetes mellitus without complications: Secondary | ICD-10-CM | POA: Diagnosis present

## 2022-06-26 DIAGNOSIS — Z794 Long term (current) use of insulin: Secondary | ICD-10-CM

## 2022-06-26 DIAGNOSIS — Z634 Disappearance and death of family member: Secondary | ICD-10-CM

## 2022-06-26 DIAGNOSIS — N182 Chronic kidney disease, stage 2 (mild): Secondary | ICD-10-CM | POA: Diagnosis present

## 2022-06-26 DIAGNOSIS — F332 Major depressive disorder, recurrent severe without psychotic features: Secondary | ICD-10-CM | POA: Diagnosis present

## 2022-06-26 DIAGNOSIS — E160 Drug-induced hypoglycemia without coma: Secondary | ICD-10-CM | POA: Diagnosis present

## 2022-06-26 DIAGNOSIS — E1022 Type 1 diabetes mellitus with diabetic chronic kidney disease: Secondary | ICD-10-CM | POA: Diagnosis present

## 2022-06-26 DIAGNOSIS — T383X5A Adverse effect of insulin and oral hypoglycemic [antidiabetic] drugs, initial encounter: Secondary | ICD-10-CM | POA: Diagnosis present

## 2022-06-26 DIAGNOSIS — Z5941 Food insecurity: Secondary | ICD-10-CM

## 2022-06-26 DIAGNOSIS — Z79899 Other long term (current) drug therapy: Secondary | ICD-10-CM

## 2022-06-26 DIAGNOSIS — E162 Hypoglycemia, unspecified: Secondary | ICD-10-CM | POA: Diagnosis present

## 2022-06-26 DIAGNOSIS — Z21 Asymptomatic human immunodeficiency virus [HIV] infection status: Secondary | ICD-10-CM | POA: Diagnosis present

## 2022-06-26 DIAGNOSIS — I129 Hypertensive chronic kidney disease with stage 1 through stage 4 chronic kidney disease, or unspecified chronic kidney disease: Secondary | ICD-10-CM | POA: Diagnosis present

## 2022-06-26 DIAGNOSIS — Z886 Allergy status to analgesic agent status: Secondary | ICD-10-CM

## 2022-06-26 DIAGNOSIS — B20 Human immunodeficiency virus [HIV] disease: Secondary | ICD-10-CM | POA: Diagnosis present

## 2022-06-26 LAB — COMPREHENSIVE METABOLIC PANEL
ALT: 15 U/L (ref 0–44)
AST: 21 U/L (ref 15–41)
Albumin: 3.8 g/dL (ref 3.5–5.0)
Alkaline Phosphatase: 53 U/L (ref 38–126)
Anion gap: 11 (ref 5–15)
BUN: 16 mg/dL (ref 6–20)
CO2: 25 mmol/L (ref 22–32)
Calcium: 9.6 mg/dL (ref 8.9–10.3)
Chloride: 105 mmol/L (ref 98–111)
Creatinine, Ser: 1.42 mg/dL — ABNORMAL HIGH (ref 0.61–1.24)
GFR, Estimated: >60 mL/min (ref >60)
Glucose, Bld: 46 mg/dL — ABNORMAL LOW (ref 70–99)
Potassium: 3.5 mmol/L (ref 3.5–5.1)
Sodium: 141 mmol/L (ref 135–145)
Total Bilirubin: 0.9 mg/dL (ref 0.3–1.2)
Total Protein: 6.9 g/dL (ref 6.5–8.1)

## 2022-06-26 LAB — CBC WITH DIFFERENTIAL/PLATELET
Abs Immature Granulocytes: 0.03 10*3/uL (ref 0.00–0.07)
Basophils Absolute: 0 10*3/uL (ref 0.0–0.1)
Basophils Relative: 0 %
Eosinophils Absolute: 0 10*3/uL (ref 0.0–0.5)
Eosinophils Relative: 0 %
HCT: 40.1 % (ref 39.0–52.0)
Hemoglobin: 13.9 g/dL (ref 13.0–17.0)
Immature Granulocytes: 0 %
Lymphocytes Relative: 9 %
Lymphs Abs: 0.8 10*3/uL (ref 0.7–4.0)
MCH: 30.8 pg (ref 26.0–34.0)
MCHC: 34.7 g/dL (ref 30.0–36.0)
MCV: 88.9 fL (ref 80.0–100.0)
Monocytes Absolute: 0.4 10*3/uL (ref 0.1–1.0)
Monocytes Relative: 5 %
Neutro Abs: 7.6 10*3/uL (ref 1.7–7.7)
Neutrophils Relative %: 86 %
Platelets: 272 10*3/uL (ref 150–400)
RBC: 4.51 MIL/uL (ref 4.22–5.81)
RDW: 13.2 % (ref 11.5–15.5)
WBC: 8.8 10*3/uL (ref 4.0–10.5)
nRBC: 0 % (ref 0.0–0.2)

## 2022-06-26 LAB — CBG MONITORING, ED
Glucose-Capillary: 106 mg/dL — ABNORMAL HIGH (ref 70–99)
Glucose-Capillary: 42 mg/dL — CL (ref 70–99)
Glucose-Capillary: 29 mg/dL — CL (ref 70–99)
Glucose-Capillary: 97 mg/dL (ref 70–99)
Glucose-Capillary: 89 mg/dL (ref 70–99)
Glucose-Capillary: 150 mg/dL — ABNORMAL HIGH (ref 70–99)

## 2022-06-26 LAB — URINALYSIS, ROUTINE W REFLEX MICROSCOPIC
Bacteria, UA: NONE SEEN
Bilirubin Urine: NEGATIVE
Glucose, UA: >=500 mg/dL — AB
Ketones, ur: 5 mg/dL — AB
Leukocytes,Ua: NEGATIVE
Nitrite: NEGATIVE
Protein, ur: >=300 mg/dL — AB
Specific Gravity, Urine: 1.028 (ref 1.005–1.030)
pH: 5 (ref 5.0–8.0)

## 2022-06-26 LAB — LIPASE, BLOOD: Lipase: 44 U/L (ref 11–51)

## 2022-06-26 LAB — GLUCOSE, CAPILLARY
Glucose-Capillary: 214 mg/dL — ABNORMAL HIGH (ref 70–99)
Glucose-Capillary: 177 mg/dL — ABNORMAL HIGH (ref 70–99)
Glucose-Capillary: 62 mg/dL — ABNORMAL LOW (ref 70–99)
Glucose-Capillary: 77 mg/dL (ref 70–99)

## 2022-06-26 MED ORDER — DEXTROSE 5 % IV SOLN: Freq: Once | INTRAVENOUS | Status: DC

## 2022-06-26 MED ORDER — SODIUM CHLORIDE 0.9 % IV BOLUS
1000.0000 mL | Freq: Once | INTRAVENOUS | Status: AC
  Administered 2022-06-26: 1000 mL via INTRAVENOUS

## 2022-06-26 MED ORDER — METOCLOPRAMIDE HCL 5 MG/ML IJ SOLN
10.0000 mg | Freq: Once | INTRAMUSCULAR | Status: AC
  Administered 2022-06-26: 10 mg via INTRAVENOUS
  Filled 2022-06-26: qty 2

## 2022-06-26 MED ORDER — ONDANSETRON HCL 4 MG/2ML IJ SOLN
4.0000 mg | Freq: Four times a day (QID) | INTRAMUSCULAR | Status: DC | PRN
  Administered 2022-06-26 – 2022-06-27 (×2): 4 mg via INTRAVENOUS
  Filled 2022-06-26 (×2): qty 2

## 2022-06-26 MED ORDER — ENOXAPARIN SODIUM 40 MG/0.4ML IJ SOSY
40.0000 mg | PREFILLED_SYRINGE | INTRAMUSCULAR | Status: DC
  Administered 2022-06-26 – 2022-06-27 (×2): 40 mg via SUBCUTANEOUS
  Filled 2022-06-26 (×2): qty 0.4

## 2022-06-26 MED ORDER — DEXTROSE 50 % IV SOLN
1.0000 | INTRAVENOUS | Status: AC
  Administered 2022-06-26: 50 mL via INTRAVENOUS

## 2022-06-26 MED ORDER — DEXTROSE 50 % IV SOLN
INTRAVENOUS | Status: AC
  Administered 2022-06-26: 50 mL
  Filled 2022-06-26: qty 50

## 2022-06-26 MED ORDER — ELVITEG-COBIC-EMTRICIT-TENOFAF 150-150-200-10 MG PO TABS
1.0000 | ORAL_TABLET | Freq: Every day | ORAL | Status: DC
  Administered 2022-06-27 – 2022-06-28 (×2): 1 via ORAL
  Filled 2022-06-26 (×2): qty 1

## 2022-06-26 MED ORDER — DEXTROSE 50 % IV SOLN
INTRAVENOUS | Status: AC
  Filled 2022-06-26: qty 50

## 2022-06-26 MED ORDER — ACETAMINOPHEN 325 MG PO TABS
650.0000 mg | ORAL_TABLET | Freq: Four times a day (QID) | ORAL | Status: DC | PRN
  Administered 2022-06-27: 650 mg via ORAL
  Filled 2022-06-26: qty 2

## 2022-06-26 MED ORDER — IOHEXOL 350 MG/ML SOLN
50.0000 mL | Freq: Once | INTRAVENOUS | Status: AC | PRN
  Administered 2022-06-26: 50 mL via INTRAVENOUS

## 2022-06-26 MED ORDER — DEXTROSE 50 % IV SOLN
1.0000 | Freq: Once | INTRAVENOUS | Status: AC
  Administered 2022-06-26: 50 mL via INTRAVENOUS
  Filled 2022-06-26: qty 50

## 2022-06-26 MED ORDER — INSULIN GLARGINE-YFGN 100 UNIT/ML ~~LOC~~ SOLN
22.0000 [IU] | Freq: Every day | SUBCUTANEOUS | Status: DC
  Administered 2022-06-26: 22 [IU] via SUBCUTANEOUS
  Filled 2022-06-26 (×2): qty 0.22

## 2022-06-26 MED ORDER — SERTRALINE HCL 100 MG PO TABS
100.0000 mg | ORAL_TABLET | Freq: Every day | ORAL | Status: DC
  Administered 2022-06-26 – 2022-06-28 (×3): 100 mg via ORAL
  Filled 2022-06-26 (×3): qty 1

## 2022-06-26 MED ORDER — BASAGLAR KWIKPEN 100 UNIT/ML ~~LOC~~ SOPN: 22.0000 [IU] | PEN_INJECTOR | Freq: Every morning | SUBCUTANEOUS | Status: DC

## 2022-06-26 MED ORDER — RIVAROXABAN 10 MG PO TABS
10.0000 mg | ORAL_TABLET | Freq: Every day | ORAL | Status: DC
  Administered 2022-06-26: 10 mg via ORAL
  Filled 2022-06-26: qty 1

## 2022-06-26 MED ORDER — INSULIN ASPART 100 UNIT/ML IJ SOLN
0.0000 [IU] | INTRAMUSCULAR | Status: DC
  Administered 2022-06-26: 3 [IU] via SUBCUTANEOUS

## 2022-06-26 MED ORDER — MELATONIN 3 MG PO TABS: 3.0000 mg | ORAL_TABLET | Freq: Every evening | ORAL | Status: DC | PRN

## 2022-06-26 MED ORDER — ACETAMINOPHEN 650 MG RE SUPP: 650.0000 mg | Freq: Four times a day (QID) | RECTAL | Status: DC | PRN

## 2022-06-26 MED ORDER — DEXTROSE 10 % IV SOLN: INTRAVENOUS | Status: DC

## 2022-06-26 NOTE — ED Notes (Signed)
I checked blood sugar it was 89.

## 2022-06-26 NOTE — Progress Notes (Signed)
Pt. Arrived to unit alert and oriented x 4, c/o fatigue. Bed in lowest position, alarm on, call bell within reach

## 2022-06-26 NOTE — ED Notes (Signed)
Pt transported to CT ?

## 2022-06-26 NOTE — Plan of Care (Signed)

## 2022-06-26 NOTE — ED Notes (Signed)
ED TO INPATIENT HANDOFF REPORT  ED Nurse Name and Phone #: Charlotte Sanes [phone]  S Name/Age/Gender [name] 37 y.o. male Room/Bed: 019C/019C  Code Status   Code Status: DNR  Home/SNF/Other Home Patient oriented to: self, place, time, and situation Is this baseline? Yes   Triage Complete: Triage complete  Chief Complaint Hypoglycemia [E16.2]  Triage Note Pt from home, c/o low blood sugar. Initial CBG 60, diaphoretic and lethargic. EMS gave 50ml D10- CBG 156 pt more alert on arrival but states he feels "weak".   Allergies Allergies  Allergen Reactions   Shellfish Allergy Anaphylaxis and Hives   Aspirin Other (See Comments)    Lump in armpit    Level of Care/Admitting Diagnosis ED Disposition     ED Disposition  Admit   Condition  --   Comment  Hospital Area: [facility] [[id number]]  Level of Care: Med-Surg [16]  May place patient in observation at [facility] or [facility] if equivalent level of care is available:: No  Covid Evaluation: Asymptomatic - no recent exposure (last 10 days) testing not required  Diagnosis: Hypoglycemia [242204]  Admitting Physician: Tyson Alias [[id number]]  Attending Physician: Tyson Alias [[id number]]          B Medical/Surgery History Past Medical History:  Diagnosis Date   Anxiety    Asthma    Closed displaced comminuted fracture of shaft of right tibia 11/01/2017   Closed fracture of base of fifth metatarsal bone of right foot 11/13/2017   Closed fracture of medial malleolus of right ankle 10/31/2017   Depression    Headache    "only w/stress" (11/01/2017)   HIV (human immunodeficiency virus infection) (HCC)    Migraine    "a few/month" (11/01/2017)   Refusal of blood transfusions as patient is Jehovah's Witness    Seizure (HCC)    "only w/my low blood sugars"  (11/01/2017)   Type I diabetes mellitus (HCC)    Past Surgical History:  Procedure Laterality Date   FRACTURE SURGERY      IM NAILING TIBIA Right 11/01/2017   INTRAMEDULLARY (IM) NAIL TIBIALRightGeneral   ORIF ANKLE FRACTURE Right 11/01/2017   ORIF ANKLE FRACTURE Right 11/01/2017   Procedure: OPEN REDUCTION INTERNAL FIXATION (ORIF) ANKLE FRACTURE;  Surgeon: Roby Lofts, MD;  Location: [facility] OR;  Service: Orthopedics;  Laterality: Right;   TIBIA IM NAIL INSERTION Right 11/01/2017   Procedure: INTRAMEDULLARY (IM) NAIL TIBIAL;  Surgeon: Roby Lofts, MD;  Location: [facility] OR;  Service: Orthopedics;  Laterality: Right;     A IV Location/Drains/Wounds Patient Lines/Drains/Airways Status     Active Line/Drains/Airways     Name Placement date Placement time Site Days   Peripheral IV 06/26/22 20 G Left Antecubital 06/26/22  --  Antecubital  less than 1            Intake/Output Last 24 hours  Intake/Output Summary (Last 24 hours) at 06/26/2022 9562 Last data filed at 06/26/2022 0510 Gross per 24 hour  Intake 799.2 ml  Output --  Net 799.2 ml    Labs/Imaging Results for orders placed or performed during the hospital encounter of 06/26/22 (from the past 48 hour(s))  CBG monitoring, ED     Status: Abnormal   Collection Time: 06/26/22  3:41 AM  Result Value Ref Range   Glucose-Capillary 42 (LL) 70 - 99 mg/dL    Comment: Glucose reference range applies only to samples taken after fasting for at least 8 hours.  Comment 1 Notify RN   POC CBG, ED     Status: Abnormal   Collection Time: 06/26/22  4:03 AM  Result Value Ref Range   Glucose-Capillary 106 (H) 70 - 99 mg/dL    Comment: Glucose reference range applies only to samples taken after fasting for at least 8 hours.  Comprehensive metabolic panel     Status: Abnormal   Collection Time: 06/26/22  4:05 AM  Result Value Ref Range   Sodium 141 135 - 145 mmol/L   Potassium 3.5 3.5 - 5.1 mmol/L   Chloride 105 98 - 111 mmol/L   CO2 25 22 - 32 mmol/L   Glucose, Bld 46 (L) 70 - 99 mg/dL    Comment: Glucose reference range applies only to samples taken  after fasting for at least 8 hours.   BUN 16 6 - 20 mg/dL   Creatinine, Ser 1.61 (H) 0.61 - 1.24 mg/dL   Calcium 9.6 8.9 - 09.6 mg/dL   Total Protein 6.9 6.5 - 8.1 g/dL   Albumin 3.8 3.5 - 5.0 g/dL   AST 21 15 - 41 U/L   ALT 15 0 - 44 U/L   Alkaline Phosphatase 53 38 - 126 U/L   Total Bilirubin 0.9 0.3 - 1.2 mg/dL   GFR, Estimated >04 >54 mL/min    Comment: (NOTE) Calculated using the CKD-EPI Creatinine Equation (2021)    Anion gap 11 5 - 15    Comment: Performed at [facility], [address] [place], [place] [address]  Lipase, blood     Status: None   Collection Time: 06/26/22  4:05 AM  Result Value Ref Range   Lipase 44 11 - 51 U/L    Comment: Performed at [facility], [address] [place], [place] [address]  CBC with Differential     Status: None   Collection Time: 06/26/22  4:05 AM  Result Value Ref Range   WBC 8.8 4.0 - 10.5 K/uL   RBC 4.51 4.22 - 5.81 MIL/uL   Hemoglobin 13.9 13.0 - 17.0 g/dL   HCT 29.5 62.1 - 30.8 %   MCV 88.9 80.0 - 100.0 fL   MCH 30.8 26.0 - 34.0 pg   MCHC 34.7 30.0 - 36.0 g/dL   RDW 65.7 84.6 - 96.2 %   Platelets 272 150 - 400 K/uL   nRBC 0.0 0.0 - 0.2 %   Neutrophils Relative % 86 %   Neutro Abs 7.6 1.7 - 7.7 K/uL   Lymphocytes Relative 9 %   Lymphs Abs 0.8 0.7 - 4.0 K/uL   Monocytes Relative 5 %   Monocytes Absolute 0.4 0.1 - 1.0 K/uL   Eosinophils Relative 0 %   Eosinophils Absolute 0.0 0.0 - 0.5 K/uL   Basophils Relative 0 %   Basophils Absolute 0.0 0.0 - 0.1 K/uL   Immature Granulocytes 0 %   Abs Immature Granulocytes 0.03 0.00 - 0.07 K/uL    Comment: Performed at [facility], [address], [place], [place] [address]  CBG monitoring, ED     Status: Abnormal   Collection Time: 06/26/22  4:59 AM  Result Value Ref Range   Glucose-Capillary 29 (LL) 70 - 99 mg/dL    Comment: Glucose reference range applies only to samples taken after fasting for at least 8 hours.  CBG monitoring, ED     Status: None    Collection Time: 06/26/22  5:40 AM  Result Value Ref Range   Glucose-Capillary  97 70 - 99 mg/dL    Comment: Glucose reference range applies only to samples taken after fasting for at least 8 hours.  CBG monitoring, ED     Status: None   Collection Time: 06/26/22  7:14 AM  Result Value Ref Range   Glucose-Capillary 89 70 - 99 mg/dL    Comment: Glucose reference range applies only to samples taken after fasting for at least 8 hours.   CT ABDOMEN PELVIS W CONTRAST  Result Date: 06/26/2022 CLINICAL DATA:  Epigastric pain EXAM: CT ABDOMEN AND PELVIS WITH CONTRAST TECHNIQUE: Multidetector CT imaging of the abdomen and pelvis was performed using the standard protocol following bolus administration of intravenous contrast. RADIATION DOSE REDUCTION: This exam was performed according to the departmental dose-optimization program which includes automated exposure control, adjustment of the mA and/or kV according to patient size and/or use of iterative reconstruction technique. CONTRAST:  50mL OMNIPAQUE IOHEXOL 350 MG/ML SOLN COMPARISON:  10/20/2019 FINDINGS: Lower chest:  No contributory findings. Hepatobiliary: No focal liver abnormality.No evidence of biliary obstruction or stone. Pancreas: Unremarkable. Spleen: Unremarkable. Adrenals/Urinary Tract: Negative adrenals. No hydronephrosis or stone. Unremarkable bladder. Stomach/Bowel:  No obstruction. No appendicitis. Vascular/Lymphatic: No acute vascular abnormality. No mass or adenopathy. Reproductive:No pathologic findings. Other: No ascites or pneumoperitoneum. Musculoskeletal: No acute abnormalities. IMPRESSION: No acute finding or explanation for symptoms. Electronically Signed   By: Tiburcio Pea M.D.   On: 06/26/2022 06:12    Pending Labs Unresulted Labs (From admission, onward)     Start     Ordered   06/27/22 0500  Basic metabolic panel  Tomorrow morning,   R        06/26/22 0835   06/27/22 0500  CBC  Tomorrow morning,   R        06/26/22 0835    06/26/22 0400  Urinalysis, Routine w reflex microscopic -Urine, Clean Catch  Once,   URGENT       Question:  Specimen Source  Answer:  Urine, Clean Catch   06/26/22 0359            Vitals/Pain Today's Vitals   06/26/22 0700 06/26/22 0715 06/26/22 0730 06/26/22 0745  BP: 136/85 133/76 134/83 133/77  Pulse: 89 81 77 79  Resp: 13 17 17 17   Temp:      TempSrc:      SpO2: 99% 100% 100% 100%  Weight:      Height:      PainSc:        Isolation Precautions No active isolations  Medications Medications  dextrose 10 % infusion ( Intravenous New Bag/Given 06/26/22 0513)  rivaroxaban (XARELTO) tablet 10 mg (has no administration in time range)  acetaminophen (TYLENOL) tablet 650 mg (has no administration in time range)    Or  acetaminophen (TYLENOL) suppository 650 mg (has no administration in time range)  sertraline (ZOLOFT) tablet 100 mg (has no administration in time range)  dextrose 50 % solution 50 mL (50 mLs Intravenous Given 06/26/22 0346)  dextrose 50 % solution (  Given 06/26/22 0346)  sodium chloride 0.9 % bolus 1,000 mL (0 mLs Intravenous Stopped 06/26/22 0510)  metoCLOPramide (REGLAN) injection 10 mg (10 mg Intravenous Given 06/26/22 0418)  dextrose 50 % solution (50 mLs  Given 06/26/22 0504)  dextrose 50 % solution 50 mL (50 mLs Intravenous Given 06/26/22 0504)  iohexol (OMNIPAQUE) 350 MG/ML injection 50 mL (50 mLs Intravenous Contrast Given 06/26/22 0552)    Mobility walks     Focused Assessments  R Recommendations: See Admitting Provider Note  Report given to:   Additional Notes:

## 2022-06-26 NOTE — ED Triage Notes (Signed)
Pt from home, c/o low blood sugar. Initial CBG 60, diaphoretic and lethargic. EMS gave 50ml D10- CBG 156 pt more alert on arrival but states he feels "weak".

## 2022-06-26 NOTE — ED Provider Notes (Signed)
Received signout from previous provider, please see his note for complete H&P.  Patient has history of HIV as well as history of type 1 diabetes.  He is here due to nausea and vomiting and hypoglycemia.  His last diabetic medication was yesterday morning.  Despite not taking any additional diabetic medication, his blood sugar has been low.  CBG initially was 46, patient received D50 and food.  It normalized then dropped down to as low as 29 for which patient was placed on a D10 drip.  CBG now improved however patient still appears drowsy and still endorse nausea without vomiting.   At this time, felt patient will need to be admitted for further symptomatic care as well as better management of his blood sugar.  EMR review patient has been admitted to the hospital multiple times for similar presentation.  Does have known history of suicidal ideation but patient denies any SI at this time and also denies intentionally trying to harm himself.  He is able to tolerate p.o.  8:07 AM Appreciate consultation from internal medicine resident who agrees to see and will admit patient for further management of his condition.  Patient at this time is agreeable for admission as he still feels weak and nauseous.  His CBG obtain 30 minutes ago was 89.  .Critical Care  Performed by: Fayrene Helper, PA-C Authorized by: Fayrene Helper, PA-C   Critical care provider statement:    Critical care time (minutes):  30   Critical care was time spent personally by me on the following activities:  Development of treatment plan with patient or surrogate, discussions with consultants, evaluation of patient's response to treatment, examination of patient, ordering and review of laboratory studies, ordering and review of radiographic studies, ordering and performing treatments and interventions, pulse oximetry, re-evaluation of patient's condition and review of old charts    BP 133/77   Pulse 79   Temp 98.8 F (37.1 C) (Oral)   Resp  17   Ht 6' (1.829 m)   Wt 89 kg   SpO2 100%   BMI 26.61 kg/m   Results for orders placed or performed during the hospital encounter of 06/26/22  Comprehensive metabolic panel  Result Value Ref Range   Sodium 141 135 - 145 mmol/L   Potassium 3.5 3.5 - 5.1 mmol/L   Chloride 105 98 - 111 mmol/L   CO2 25 22 - 32 mmol/L   Glucose, Bld 46 (L) 70 - 99 mg/dL   BUN 16 6 - 20 mg/dL   Creatinine, Ser 1.61 (H) 0.61 - 1.24 mg/dL   Calcium 9.6 8.9 - 09.6 mg/dL   Total Protein 6.9 6.5 - 8.1 g/dL   Albumin 3.8 3.5 - 5.0 g/dL   AST 21 15 - 41 U/L   ALT 15 0 - 44 U/L   Alkaline Phosphatase 53 38 - 126 U/L   Total Bilirubin 0.9 0.3 - 1.2 mg/dL   GFR, Estimated >04 >54 mL/min   Anion gap 11 5 - 15  Lipase, blood  Result Value Ref Range   Lipase 44 11 - 51 U/L  CBC with Differential  Result Value Ref Range   WBC 8.8 4.0 - 10.5 K/uL   RBC 4.51 4.22 - 5.81 MIL/uL   Hemoglobin 13.9 13.0 - 17.0 g/dL   HCT 09.8 11.9 - 14.7 %   MCV 88.9 80.0 - 100.0 fL   MCH 30.8 26.0 - 34.0 pg   MCHC 34.7 30.0 - 36.0 g/dL   RDW 82.9  11.5 - 15.5 %   Platelets 272 150 - 400 K/uL   nRBC 0.0 0.0 - 0.2 %   Neutrophils Relative % 86 %   Neutro Abs 7.6 1.7 - 7.7 K/uL   Lymphocytes Relative 9 %   Lymphs Abs 0.8 0.7 - 4.0 K/uL   Monocytes Relative 5 %   Monocytes Absolute 0.4 0.1 - 1.0 K/uL   Eosinophils Relative 0 %   Eosinophils Absolute 0.0 0.0 - 0.5 K/uL   Basophils Relative 0 %   Basophils Absolute 0.0 0.0 - 0.1 K/uL   Immature Granulocytes 0 %   Abs Immature Granulocytes 0.03 0.00 - 0.07 K/uL  CBG monitoring, ED  Result Value Ref Range   Glucose-Capillary 42 (LL) 70 - 99 mg/dL   Comment 1 Notify RN   POC CBG, ED  Result Value Ref Range   Glucose-Capillary 106 (H) 70 - 99 mg/dL  CBG monitoring, ED  Result Value Ref Range   Glucose-Capillary 29 (LL) 70 - 99 mg/dL  CBG monitoring, ED  Result Value Ref Range   Glucose-Capillary 97 70 - 99 mg/dL  CBG monitoring, ED  Result Value Ref Range    Glucose-Capillary 89 70 - 99 mg/dL   CT ABDOMEN PELVIS W CONTRAST  Result Date: 06/26/2022 CLINICAL DATA:  Epigastric pain EXAM: CT ABDOMEN AND PELVIS WITH CONTRAST TECHNIQUE: Multidetector CT imaging of the abdomen and pelvis was performed using the standard protocol following bolus administration of intravenous contrast. RADIATION DOSE REDUCTION: This exam was performed according to the departmental dose-optimization program which includes automated exposure control, adjustment of the mA and/or kV according to patient size and/or use of iterative reconstruction technique. CONTRAST:  50mL OMNIPAQUE IOHEXOL 350 MG/ML SOLN COMPARISON:  10/20/2019 FINDINGS: Lower chest:  No contributory findings. Hepatobiliary: No focal liver abnormality.No evidence of biliary obstruction or stone. Pancreas: Unremarkable. Spleen: Unremarkable. Adrenals/Urinary Tract: Negative adrenals. No hydronephrosis or stone. Unremarkable bladder. Stomach/Bowel:  No obstruction. No appendicitis. Vascular/Lymphatic: No acute vascular abnormality. No mass or adenopathy. Reproductive:No pathologic findings. Other: No ascites or pneumoperitoneum. Musculoskeletal: No acute abnormalities. IMPRESSION: No acute finding or explanation for symptoms. Electronically Signed   By: Tiburcio Pea M.D.   On: 06/26/2022 06:12       Fayrene Helper, PA-C 06/26/22 1610    Tegeler, Canary Brim, MD 06/26/22 [phone]

## 2022-06-26 NOTE — ED Provider Notes (Signed)
EMERGENCY DEPARTMENT AT [facility] Provider Note   CSN: [id number] Arrival date & time: 06/26/22  0309     History  Chief Complaint  Patient presents with   Hypoglycemia    [name] is a 37 y.o. male.  HPI   Patient with medical history including diabetes, HIV, hypertension, depression, presenting with complaints of low blood sugar.  Patient states starting yesterday he had associated nausea vomiting, states he has been unable to tolerate p.o., states that because of this his blood sugar has become very low.  He states that he took his typical diabetes medication early morning yesterday, has not had any since then.  He denies any fevers chills cough congestion general body aches no abdominal surgeries, denies excessive alcohol use or NSAID use no history of stomach ulcers, still passing gas having normal bowel movements denies any bloody emesis or coffee-ground emesis denies any bloody stools or dark tarry stools.  Patient states that he does not feel suicidal, and he denies any suicidal Holmes ideations at this time.  States that his blood sugars are low because he has been unable to eat or drink.  Per EMS patient's glucose was 60 diaphoretic, lethargic, was given D10 and CBG was 156.  Home Medications Prior to Admission medications   Medication Sig Start Date End Date Taking? Authorizing Provider  elvitegravir-cobicistat-emtricitabine-tenofovir (GENVOYA) 150-150-200-10 MG TABS tablet Take 1 tablet by mouth daily with breakfast. 02/03/22  Yes Kathlynn Grate, DO  hydrALAZINE (APRESOLINE) 25 MG tablet Take 1 tablet (25 mg total) by mouth every 8 (eight) hours. For hypertension 02/10/22  Yes Storm Frisk, MD  Insulin Glargine [facility]) 100 UNIT/ML Inject 22 Units into the skin daily. 02/10/22  Yes Storm Frisk, MD  insulin lispro (HUMALOG) 100 UNIT/ML KwikPen Inject 10 Units into the skin 2 (two) times daily before lunch and supper.  Hold if blood sugar less than 150 02/10/22  Yes Storm Frisk, MD  sertraline (ZOLOFT) 100 MG tablet Take 1 tablet (100 mg total) by mouth daily. 02/10/22  Yes Storm Frisk, MD  valsartan-hydrochlorothiazide (DIOVAN-HCT) 320-25 MG tablet Take 1 tablet by mouth daily. 02/10/22  Yes Storm Frisk, MD  blood glucose meter kit and supplies Dispense based on patient and insurance preference. Use up to four times daily as directed: For blood sugar monitoring 10/08/20   Armandina Stammer I, NP  glucose blood (TRUE METRIX BLOOD GLUCOSE TEST) test strip use up to 4 times daily as directed for blood sugar monitoring 11/18/21   Storm Frisk, MD  Insulin Pen Needle 31G X 5 MM MISC Use with insulin pen 02/10/22   Storm Frisk, MD  sodium fluoride (PREVIDENT 5000 PLUS) 1.1 % CREA dental cream Brush on teeth with a toothbrush after evening mouth care. Spit out excess and do not rinse Patient not taking: Reported on 06/26/2022 01/28/21     pravastatin (PRAVACHOL) 20 MG tablet Take 1 tablet (20 mg total) by mouth daily. Patient not taking: Reported on 03/31/2019 03/08/19 07/08/19  Claiborne Rigg, NP      Allergies    Shellfish allergy and Aspirin    Review of Systems   Review of Systems  Constitutional:  Negative for chills and fever.  Respiratory:  Negative for shortness of breath.   Cardiovascular:  Negative for chest pain.  Gastrointestinal:  Negative for abdominal pain.  Neurological:  Positive for weakness. Negative for headaches.    Physical Exam Updated Vital  Signs BP 137/80   Pulse 87   Temp 98.8 F (37.1 C) (Oral)   Resp 20   Ht 6' (1.829 m)   Wt 89 kg   SpO2 99%   BMI 26.61 kg/m  Physical Exam Vitals and nursing note reviewed.  Constitutional:      General: He is not in acute distress.    Appearance: He is not ill-appearing.  HENT:     Head: Normocephalic and atraumatic.     Nose: No congestion.  Eyes:     Conjunctiva/sclera: Conjunctivae normal.  Cardiovascular:      Rate and Rhythm: Normal rate and regular rhythm.     Pulses: Normal pulses.     Heart sounds: No murmur heard.    No friction rub. No gallop.  Pulmonary:     Effort: No respiratory distress.     Breath sounds: No wheezing, rhonchi or rales.  Abdominal:     Palpations: Abdomen is soft.     Tenderness: There is abdominal tenderness. There is no right CVA tenderness or left CVA tenderness.     Comments: Abdomen nondistended, soft, noted epigastric tenderness without guarding rebound has or peritoneal sign negative Murphy sign McBurney point.  Musculoskeletal:     Right lower leg: No edema.     Left lower leg: No edema.  Skin:    General: Skin is warm and dry.  Neurological:     Mental Status: He is alert.     Comments: No facial asymmetry no difficulty with word finding following two-step commands there is no unilateral weakness present.  Psychiatric:        Mood and Affect: Mood normal.     Comments: Denies suicidal/homicidal ideations, patient has noted flat affect, will not make eye contact, does not appear to be respond to internal stimuli.     ED Results / Procedures / Treatments   Labs (all labs ordered are listed, but only abnormal results are displayed) Labs Reviewed  COMPREHENSIVE METABOLIC PANEL - Abnormal; Notable for the following components:      Result Value   Glucose, Bld 46 (*)    Creatinine, Ser 1.42 (*)    All other components within normal limits  CBG MONITORING, ED - Abnormal; Notable for the following components:   Glucose-Capillary 42 (*)    All other components within normal limits  CBG MONITORING, ED - Abnormal; Notable for the following components:   Glucose-Capillary 106 (*)    All other components within normal limits  CBG MONITORING, ED - Abnormal; Notable for the following components:   Glucose-Capillary 29 (*)    All other components within normal limits  LIPASE, BLOOD  CBC WITH DIFFERENTIAL/PLATELET  URINALYSIS, ROUTINE W REFLEX MICROSCOPIC   CBG MONITORING, ED    EKG None  Radiology CT ABDOMEN PELVIS W CONTRAST  Result Date: 06/26/2022 CLINICAL DATA:  Epigastric pain EXAM: CT ABDOMEN AND PELVIS WITH CONTRAST TECHNIQUE: Multidetector CT imaging of the abdomen and pelvis was performed using the standard protocol following bolus administration of intravenous contrast. RADIATION DOSE REDUCTION: This exam was performed according to the departmental dose-optimization program which includes automated exposure control, adjustment of the mA and/or kV according to patient size and/or use of iterative reconstruction technique. CONTRAST:  50mL OMNIPAQUE IOHEXOL 350 MG/ML SOLN COMPARISON:  10/20/2019 FINDINGS: Lower chest:  No contributory findings. Hepatobiliary: No focal liver abnormality.No evidence of biliary obstruction or stone. Pancreas: Unremarkable. Spleen: Unremarkable. Adrenals/Urinary Tract: Negative adrenals. No hydronephrosis or stone. Unremarkable bladder. Stomach/Bowel:  No  obstruction. No appendicitis. Vascular/Lymphatic: No acute vascular abnormality. No mass or adenopathy. Reproductive:No pathologic findings. Other: No ascites or pneumoperitoneum. Musculoskeletal: No acute abnormalities. IMPRESSION: No acute finding or explanation for symptoms. Electronically Signed   By: Tiburcio Pea M.D.   On: 06/26/2022 06:12    Procedures .Critical Care  Performed by: Carroll Sage, PA-C Authorized by: Carroll Sage, PA-C   Critical care provider statement:    Critical care time (minutes):  30   Critical care was necessary to treat or prevent imminent or life-threatening deterioration of the following conditions:  Endocrine crisis   Critical care was time spent personally by me on the following activities:  Development of treatment plan with patient or surrogate, discussions with consultants, evaluation of patient's response to treatment, examination of patient, ordering and review of laboratory studies, ordering and review  of radiographic studies, ordering and performing treatments and interventions, pulse oximetry, re-evaluation of patient's condition and review of old charts   I assumed direction of critical care for this patient from another provider in my specialty: no     Care discussed with: admitting provider       Medications Ordered in ED Medications  dextrose 10 % infusion ( Intravenous New Bag/Given 06/26/22 0513)  dextrose 50 % solution 50 mL (50 mLs Intravenous Given 06/26/22 0346)  dextrose 50 % solution (  Given 06/26/22 0346)  sodium chloride 0.9 % bolus 1,000 mL (0 mLs Intravenous Stopped 06/26/22 0510)  metoCLOPramide (REGLAN) injection 10 mg (10 mg Intravenous Given 06/26/22 0418)  dextrose 50 % solution (50 mLs  Given 06/26/22 0504)  dextrose 50 % solution 50 mL (50 mLs Intravenous Given 06/26/22 0504)  iohexol (OMNIPAQUE) 350 MG/ML injection 50 mL (50 mLs Intravenous Contrast Given 06/26/22 1610)    ED Course/ Medical Decision Making/ A&P                             Medical Decision Making Amount and/or Complexity of Data Reviewed Labs: ordered. Radiology: ordered.  Risk Prescription drug management. Decision regarding hospitalization.   This patient presents to the ED for concern of hypoglycemia, this involves an extensive number of treatment options, and is a complaint that carries with it a high risk of complications and morbidity.  The differential diagnosis includes infectious etiology, overdose, pancreatitis, cholecystitis, bowel obstruction,    Additional history obtained:  Additional history obtained from EMS External records from outside source obtained and reviewed including recent ER notes   Co morbidities that complicate the patient evaluation  Type 1 diabetes, depression  Social Determinants of Health:  N/A   Lab Tests:  I Ordered, and personally interpreted labs.  The pertinent results include: CBC is unremarkable, CMP reveals glucose of 46, creatinine  1.42, lipase 44   Imaging Studies ordered:  I ordered imaging studies including CT abdomen pelvis I independently visualized and interpreted imaging which showed negative for acute findings I agree with the radiologist interpretation   Cardiac Monitoring:  The patient was maintained on a cardiac monitor.  I personally viewed and interpreted the cardiac monitored which showed an underlying rhythm of: Without signs of ischemia   Medicines ordered and prescription drug management:  I ordered medication including Reglan I have reviewed the patients home medicines and have made adjustments as needed  Critical Interventions:  D10 drip for refractory hypoglycemia Reassess CBG is now 96   Reevaluation:  Presents with hyperglycemia, patient noted epigastric tenderness, will obtain  basic lab work workup provide with Reglan and reassess  Patient's glucose is 46, will provide him with D50, encouraged him to eat.  Patient glucose is 29, will place on an D10 drip as his glucose has dropped for the third time.  Consultations Obtained:  Pending.     Test Considered:  N/A    Rule out Suspicion for CVA is low at this time as no focal deficits present my exam presentation atypical etiology.  low suspicion for lower lobe pneumonia as lung sounds are clear bilaterally, will defer imaging at this time.  Doubt ACS no cardiac history EKG without signs of ischemia I have low suspicion for liver or gallbladder abnormality, liver enzymes, alk phos, T bili all within normal limits.  Low suspicion for pancreatitis as lipase is within normal limits.  Low suspicion for ruptured stomach ulcer as she has no peritoneal sign present on exam.  Suspicion for bowel obstruction, diverticulitis, kidney stone, pyelo-, AAA, intra-abdominal infection as well as CT imaging pending for these findings.     Dispostion and problem list  After consideration of the diagnostic results and the patients response to  treatment, I feel that the patent would benefit from admission.  Hypoglycemia-unclear etiology, possible from uncontrolled nausea vomiting, patient will need continued antiemetics, close glucose monitoring.  Due to shift change patient be handed off to [facility] he will admit the patient.             Final Clinical Impression(s) / ED Diagnoses Final diagnoses:  Hypoglycemia  Nausea and vomiting, unspecified vomiting type    Rx / DC Orders ED Discharge Orders     None         Carroll Sage, PA-C 06/26/22 1610    Gilda Crease, MD 06/26/22 [phone]

## 2022-06-26 NOTE — Progress Notes (Signed)
Md notified of Blood glucose 214 and BP 157/99. New orders placed

## 2022-06-26 NOTE — Hospital Course (Signed)
[name] is a 37 yo male with T1DM who presents with ***   Admitted for n/v and hyppolgycemia Took his insulin yesterday AM and CBG still goig down Not vomiting now tolerating food    4/29: Patient was able to sleep overnight. Patient reports that he is stressed this morning. Patient feels as though he has been blamed for his hypoglycemia. He feels defeated. Patient is tearful. He is not hungry at this time. Denies vomiting overnight.   - Switched SSI to sensitive, semglee to 11 units  4/30: Denies nausea or vomiting today. Was able to eat well yesterday.   [name] -  You were admitted to the hospital because your blood sugars were too low. We have modified your insulin therapy to provide you with coverage for your type 1 diabetes and decrease the risk of low blood sugar.  DIABETES -Basaglar - long acting insulin - inject 14 units into your skin every day without missing doses  Insulin Lispro - Humalog - short acting -Use this insulin only when you eat. Do not use if you DONT eat -As the diabetes educator instructed, for every approximate 15 grams of carbs (pasta, potatoes, rice, fruit), inject 1 unit of insulin into your skin  DEPRESSION Sertraline (Zoloft) - we have increased the dose at 150 mg daily.  High blood pressure - please take your blood pressure at home or at  the pharmacy to know what your blood pressures are outside of the hospital.   Follow up with your primary care provider in the next 2 weeks. If you have not been called for an appoitnment or are unable to schedule one at their office, please call at [phone] and make an appoitnment with the [facility]. We want to make sure that all your medical issues continue to improve and help you get in the right direction if they need more care.  Please call [phone] if your mood worsens or start having suicidal thoughts. Surround yourself with people. The 3 services below provide walk ins for  mental health. I believe this would be helpful to you as you cope with many life stressors. There is help for you, [name]. One thing at a time.   [facility] - preferred! URGENT CARE SERVICES - WALK IN Phone: [phone]  Address: [address] [place], [place] [address]  Hours:Open 24/7, No appointment required.  URGENT CARE SERVICES Assessment:  mental health evaluation, assessing immediate safety concerns and further mental health needs.  Referral: Resources, Connect to community-based mental health treatment, when indicated, including psychotherapy, psychiatry and other specialized behavioral health or substance use disorder services (for those not already in treatment).  Transitional care: In-person assessment and/or virtual follow up during the patient's transition to connect them with the appropriate outpatient services.  OUTPATIENT SERVICES  Individual Therapy Partial Hospitalization Program (PHP) Substance Abuse Intensive Outpatient Program [facility]) Specialized Intensive Adult Group Therapy Medication Management Peer Living Room

## 2022-06-26 NOTE — H&P (Addendum)
Date: 06/26/2022               Patient Name:  [name] MRN: [id number]  DOB: [birth year] Age / Sex: 37 y.o., male   PCP: Storm Frisk, MD         Medical Service: Internal Medicine Teaching Service         Attending Physician: Dr. Oswaldo Done, Marquita Palms, *      First Contact: Dr. Morene Crocker, MD Pager [phone]    Second Contact: Dr. Elza Rafter, DO Pager [phone]         After Hours (After 5p/  First Contact Pager: [phone]  weekends / holidays): Second Contact Pager: [phone]   SUBJECTIVE   Chief Complaint: vomiting  History of Present Illness: [name] os a 37 yo male with T1DM, HIV who presented with nausea and vomiting  Patient was in his usual state of health until yesterday morning. He states that he has felt nauseous and has had episodes of vomiting since he woke from a nap in the middle of the day yesterday. He had some crackers and ginger ale right before the episode. Feels as though he vomited 2-3 times an hour yesterday after this, and did not have any vomiting prior to these episodes. Tried to keep up with water to stay hydrated, but felt this was insufficient. He does not remember eating any unusual foods that could have triggered his episodes of vomiting. He took both long and short acting insulin (22u and 10units, respectively) yesterday around lunch time. Denies trying to overdose; he states he needs his insulin as he is a type 1 diabetic.  Mr. Fosco denies upper respiratory symptoms, abdominal pain, constipation, diarrhea, or changes in bowel habits. Denies dysuria, penile discharges.  Denies headaches, changes in vision, dizziness or pre syncopal episodes. No chest pain. Denies sick contacts. He also states denying abnormal body movements, seizures, or periods of altered mental status or of urinary or fecal incontinence in the past week or so.   Patient was diagnosed with type 1 diabetes in his childhood and manages insulin  dosing by himself. He reports using both long and short acting insulin everyday, regardless of whether he eats meals. He tries to check blood sugars, but does not do it consistently. Whenever he checks them, the are either very high or very low. He cannot recall exact numbers.   The patient states that he does not have regular meals. This is because of financial hardship and difficulty accessing food as well as chronic lack of appetite.  Often times, he has to just force himself to eat. He states that this is associated with his poor mood and that he often feels depressed. He shares with [place] that he has had multiple stressors and difficulty coping with them. Most recently, patient is dealing with his eminent passing of his grandmother, which made him tearful. He has been struggling with lack of interest in activities that used to bring him joy, lack of sleep, and feeling isolated. He does have firearms at home that are not in a locked room, which he states is for personal protection. However patient denies SI or plans.   Patient had been previously diagnosed with hypertension, but states this in the setting of difficulty with environment and that he does not believe medication is helpful in this instance. He follows i  Meds:  Genvoya Hydralazine 25 mg TID Basaglar 22u daily Humalog 10u BID with meals (but also takes without food) Zoloft  100 mg daily Valsartan-HCTZ 320-25 mg daily  Current Meds  Medication Sig   elvitegravir-cobicistat-emtricitabine-tenofovir (GENVOYA) 150-150-200-10 MG TABS tablet Take 1 tablet by mouth daily with breakfast.   hydrALAZINE (APRESOLINE) 25 MG tablet Take 1 tablet (25 mg total) by mouth every 8 (eight) hours. For hypertension   Insulin Glargine (BASAGLAR KWIKPEN) 100 UNIT/ML Inject 22 Units into the skin daily.   insulin lispro (HUMALOG) 100 UNIT/ML KwikPen Inject 10 Units into the skin 2 (two) times daily before lunch and supper. Hold if blood sugar less than 150    sertraline (ZOLOFT) 100 MG tablet Take 1 tablet (100 mg total) by mouth daily.   valsartan-hydrochlorothiazide (DIOVAN-HCT) 320-25 MG tablet Take 1 tablet by mouth daily.    Past Medical History  Past Surgical History:  Procedure Laterality Date   FRACTURE SURGERY     IM NAILING TIBIA Right 11/01/2017   INTRAMEDULLARY (IM) NAIL TIBIALRightGeneral   ORIF ANKLE FRACTURE Right 11/01/2017   ORIF ANKLE FRACTURE Right 11/01/2017   Procedure: OPEN REDUCTION INTERNAL FIXATION (ORIF) ANKLE FRACTURE;  Surgeon: Roby Lofts, MD;  Location: [facility] OR;  Service: Orthopedics;  Laterality: Right;   TIBIA IM NAIL INSERTION Right 11/01/2017   Procedure: INTRAMEDULLARY (IM) NAIL TIBIAL;  Surgeon: Roby Lofts, MD;  Location: [facility] OR;  Service: Orthopedics;  Laterality: Right;    Social:  Lives in [place], [place] where he stays at a friend's house for now, years ago was unhomed Occupation: between work right now Support: friends Level of Function: independent ADLs and iADLs PCP: Dr. Delford Field ([facility]) Substances: No tobacco use. Smokes marijuana to cope with stress. No alcohol (quit 2-3 years ago). No IV drug use.   Family History:  brain and breast cancer  Allergies: Allergies as of 06/26/2022 - Review Complete 06/26/2022  Allergen Reaction Noted   Shellfish allergy Anaphylaxis and Hives 04/13/2012   Aspirin Other (See Comments) 07/08/2019    Review of Systems: A complete ROS was negative except as per HPI.   OBJECTIVE:   Physical Exam: Blood pressure 133/77, pulse 79, temperature 98.8 F (37.1 C), temperature source Oral, resp. rate 17, height 6' (1.829 m), weight 89 kg, SpO2 100 %.  Constitutional:  HENT: normocephalic atraumatic, mucous membranes moist, dental decay, no ulcers, erythema or abnormal changes in oral mucosa Eyes: conjunctiva non-erythematous, non icterus Neck: supple Cardiovascular: regular rate and rhythm, no m/r/g Pulmonary/Chest: normal work of  breathing on room air, lungs clear to auscultation bilaterally Abdominal: soft, non-tender, non-distended MSK: normal bulk and tone Neurological: alert & oriented x 3, 5/5 strength in bilateral upper and lower extremities, normal gait Skin: warm and dry Psych: Depressed mood and affect  Labs: CBC    Component Value Date/Time   WBC 8.8 06/26/2022 0405   RBC 4.51 06/26/2022 0405   HGB 13.9 06/26/2022 0405   HCT 40.1 06/26/2022 0405   PLT 272 06/26/2022 0405   MCV 88.9 06/26/2022 0405   MCH 30.8 06/26/2022 0405   MCHC 34.7 06/26/2022 0405   RDW 13.2 06/26/2022 0405   LYMPHSABS 0.8 06/26/2022 0405   MONOABS 0.4 06/26/2022 0405   EOSABS 0.0 06/26/2022 0405   BASOSABS 0.0 06/26/2022 0405     CMP     Component Value Date/Time   NA 141 06/26/2022 0405   K 3.5 06/26/2022 0405   CL 105 06/26/2022 0405   CO2 25 06/26/2022 0405   GLUCOSE 46 (L) 06/26/2022 0405   BUN 16 06/26/2022 0405   CREATININE 1.42 (  H) 06/26/2022 0405   CREATININE 1.27 (H) 10/21/2020 1142   CALCIUM 9.6 06/26/2022 0405   PROT 6.9 06/26/2022 0405   ALBUMIN 3.8 06/26/2022 0405   AST 21 06/26/2022 0405   ALT 15 06/26/2022 0405   ALKPHOS 53 06/26/2022 0405   BILITOT 0.9 06/26/2022 0405   GFRNONAA >60 06/26/2022 0405   GFRNONAA 90 03/10/2020 0950   GFRAA 104 03/10/2020 0950     Imaging: 06/26/2022 CT abdomen and pelvis - no evidence of acute abnormalities  EKG: personally reviewed my interpretation is Sinus rhythm, with L axis deviation, ST segment elevation on V3 vs elevated J point, likely in the setting of early repolarization when compared to prior EKG in 01/2022.  ASSESSMENT & PLAN:   Assessment & Plan by Problem: Active Problems:   Hypoglycemia   Hypoglycemia Nausea and vomiting Type 1 Diabetes Patient T1DM, diagnosed in childhood with inconsistent outpatient follow up. Of note, patient this patient has had 3 visits in the past 6 months for hypoglycemia episodes. A1c 9.8  01/2022. Patient on  Lantus 22 and Humalog 10units BID, which he takes despite meals, including prior to admission and likely the etiology of his hypoglycemia. BG on admission 42. Patient received 2x D50 bolus with appropriate response to 106; however, BG level decreased to 29. Patient was continued  on D10 infusion with BG wnl. BG now have normalized and patient is taking a diet. Unclear if patient's nausea was secondary to hypoglycemia episode. However, review of systems and initial laboratory and imaging work up does not reveal overt abnormalities for an alternate explanation. -CBG monitoring q4HR -D50 replacement if hypoglycemic -Restart home Lantus 22 units -Restart PO intake -Monitor electrolytes  AKI on CKD stage 2 Cr. 1.42 from baseline of  0.97 3 months ago. Suspect pre-renal of unknown duration given patient's history. Because history of untreated hypertension an uncontrolled DM, CKD progression is also in the differential. Of note, patient with microalbuminuria in 01/2022. Will obtain U/A and urine studies -IV and PO fluid resuscitation -Follow U/A and urine studies  Severe Depression Had been on Citalopram in the past, and was restarted on Zoloft at last PCP visit in December 2023. Patient has a lapse in treatment for the past two months, recently reinitiated. Prior suicidal ideation per note review, none today. Unclear if treatment failure or not enough consistency in medication adherence. However, given sleep and appetite disturbances, would favor transitioning patient to Mirtazipine at outpatient follow up -Restart home Sertraline 100 mg  -Refer to psychotherapy at discharge  HIV Hx Syphilis Followed at ID clinic with Dr. Earlene Plater, last seen in December 2023. 02/03/2022 HIV RNA quant not detected, CD4 T cells 520. Last filled 05/10/2022. Of note, Patient Patient has had a history of syphilis, previously treated in the outpatient setting with Bicillin in September of 2022 after a 1:32 titer. His last titer  after treatment in 01/2022 (1.3 years later) was 1:16. Per guidelines, a 4-fold change in titer is considered adequate therapeutic response, which in this patient case would be 1:8. -Continue Genvoya  -Consider repeating RPR titer and treatment with Bicillin - Follow up at his 6 month-visit with Dr. Earlene Plater  Hypertension Patient previously on hydralazine and valsartan-hydrochlorothiazide. Has not continued therapy at home. Normotensive during our evaluation.  -Monitor VS  Dental decay -This patient would benefit of outpatient dental services  [facility]  -TOC consult -Consider referral to [facility] for continuation of care and services  Diet: Carb-Modified VTE: DOAC IVF: None,None Code: DNR  Prior to  Admission Living Arrangement: Home, living friends Anticipated Discharge Location: Home Barriers to Discharge: Clinical improvement  Dispo: Admit patient to Observation with expected length of stay less than 2 midnights.  Signed: Morene Crocker, MD Pager [phone] [facility] Internal Medicine Program - PGY-1 06/26/2022, 1:02 PM

## 2022-06-27 DIAGNOSIS — Z794 Long term (current) use of insulin: Secondary | ICD-10-CM | POA: Diagnosis not present

## 2022-06-27 DIAGNOSIS — K029 Dental caries, unspecified: Secondary | ICD-10-CM | POA: Diagnosis present

## 2022-06-27 DIAGNOSIS — N179 Acute kidney failure, unspecified: Secondary | ICD-10-CM | POA: Diagnosis present

## 2022-06-27 DIAGNOSIS — E160 Drug-induced hypoglycemia without coma: Secondary | ICD-10-CM

## 2022-06-27 DIAGNOSIS — I129 Hypertensive chronic kidney disease with stage 1 through stage 4 chronic kidney disease, or unspecified chronic kidney disease: Secondary | ICD-10-CM | POA: Diagnosis present

## 2022-06-27 DIAGNOSIS — Z66 Do not resuscitate: Secondary | ICD-10-CM | POA: Diagnosis present

## 2022-06-27 DIAGNOSIS — R319 Hematuria, unspecified: Secondary | ICD-10-CM | POA: Diagnosis present

## 2022-06-27 DIAGNOSIS — Z91013 Allergy to seafood: Secondary | ICD-10-CM | POA: Diagnosis not present

## 2022-06-27 DIAGNOSIS — T383X5A Adverse effect of insulin and oral hypoglycemic [antidiabetic] drugs, initial encounter: Secondary | ICD-10-CM | POA: Diagnosis present

## 2022-06-27 DIAGNOSIS — N182 Chronic kidney disease, stage 2 (mild): Secondary | ICD-10-CM | POA: Diagnosis present

## 2022-06-27 DIAGNOSIS — F332 Major depressive disorder, recurrent severe without psychotic features: Secondary | ICD-10-CM | POA: Diagnosis present

## 2022-06-27 DIAGNOSIS — Z634 Disappearance and death of family member: Secondary | ICD-10-CM | POA: Diagnosis not present

## 2022-06-27 DIAGNOSIS — R112 Nausea with vomiting, unspecified: Secondary | ICD-10-CM | POA: Diagnosis present

## 2022-06-27 DIAGNOSIS — Z886 Allergy status to analgesic agent status: Secondary | ICD-10-CM | POA: Diagnosis not present

## 2022-06-27 DIAGNOSIS — E10649 Type 1 diabetes mellitus with hypoglycemia without coma: Secondary | ICD-10-CM | POA: Diagnosis present

## 2022-06-27 DIAGNOSIS — Z8619 Personal history of other infectious and parasitic diseases: Secondary | ICD-10-CM | POA: Diagnosis not present

## 2022-06-27 DIAGNOSIS — E1022 Type 1 diabetes mellitus with diabetic chronic kidney disease: Secondary | ICD-10-CM | POA: Diagnosis present

## 2022-06-27 DIAGNOSIS — Z79899 Other long term (current) drug therapy: Secondary | ICD-10-CM | POA: Diagnosis not present

## 2022-06-27 DIAGNOSIS — B20 Human immunodeficiency virus [HIV] disease: Secondary | ICD-10-CM | POA: Diagnosis present

## 2022-06-27 DIAGNOSIS — Z5989 Other problems related to housing and economic circumstances: Secondary | ICD-10-CM | POA: Diagnosis not present

## 2022-06-27 DIAGNOSIS — Z5941 Food insecurity: Secondary | ICD-10-CM | POA: Diagnosis not present

## 2022-06-27 HISTORY — DX: Adverse effect of insulin and oral hypoglycemic (antidiabetic) drugs, initial encounter: E16.0

## 2022-06-27 LAB — CBC
HCT: 40 % (ref 39.0–52.0)
Hemoglobin: 14 g/dL (ref 13.0–17.0)
MCH: 31.1 pg (ref 26.0–34.0)
MCHC: 35 g/dL (ref 30.0–36.0)
MCV: 88.9 fL (ref 80.0–100.0)
Platelets: 257 10*3/uL (ref 150–400)
RBC: 4.5 MIL/uL (ref 4.22–5.81)
RDW: 13.3 % (ref 11.5–15.5)
WBC: 9.3 10*3/uL (ref 4.0–10.5)
nRBC: 0 % (ref 0.0–0.2)

## 2022-06-27 LAB — GLUCOSE, CAPILLARY
Glucose-Capillary: 105 mg/dL — ABNORMAL HIGH (ref 70–99)
Glucose-Capillary: 120 mg/dL — ABNORMAL HIGH (ref 70–99)
Glucose-Capillary: 161 mg/dL — ABNORMAL HIGH (ref 70–99)
Glucose-Capillary: 260 mg/dL — ABNORMAL HIGH (ref 70–99)
Glucose-Capillary: 285 mg/dL — ABNORMAL HIGH (ref 70–99)
Glucose-Capillary: 292 mg/dL — ABNORMAL HIGH (ref 70–99)
Glucose-Capillary: 51 mg/dL — ABNORMAL LOW (ref 70–99)
Glucose-Capillary: 53 mg/dL — ABNORMAL LOW (ref 70–99)

## 2022-06-27 LAB — PROTEIN / CREATININE RATIO, URINE
Creatinine, Urine: 130 mg/dL
Protein Creatinine Ratio: 1.06 mg/mg{Cre} — ABNORMAL HIGH (ref 0.00–0.15)
Total Protein, Urine: 138 mg/dL

## 2022-06-27 LAB — BASIC METABOLIC PANEL
Anion gap: 7 (ref 5–15)
BUN: 18 mg/dL (ref 6–20)
CO2: 25 mmol/L (ref 22–32)
Calcium: 8.8 mg/dL — ABNORMAL LOW (ref 8.9–10.3)
Chloride: 102 mmol/L (ref 98–111)
Creatinine, Ser: 1.81 mg/dL — ABNORMAL HIGH (ref 0.61–1.24)
GFR, Estimated: 49 mL/min — ABNORMAL LOW (ref >60)
Glucose, Bld: 136 mg/dL — ABNORMAL HIGH (ref 70–99)
Potassium: 3.7 mmol/L (ref 3.5–5.1)
Sodium: 134 mmol/L — ABNORMAL LOW (ref 135–145)

## 2022-06-27 MED ORDER — AMLODIPINE BESYLATE 10 MG PO TABS
10.0000 mg | ORAL_TABLET | Freq: Every day | ORAL | Status: DC
  Administered 2022-06-27 – 2022-06-28 (×2): 10 mg via ORAL
  Filled 2022-06-27 (×2): qty 1

## 2022-06-27 MED ORDER — INSULIN GLARGINE-YFGN 100 UNIT/ML ~~LOC~~ SOLN
15.0000 [IU] | Freq: Every day | SUBCUTANEOUS | Status: DC
  Filled 2022-06-27: qty 0.15

## 2022-06-27 MED ORDER — INSULIN ASPART 100 UNIT/ML IJ SOLN
0.0000 [IU] | Freq: Three times a day (TID) | INTRAMUSCULAR | Status: DC
  Administered 2022-06-27: 5 [IU] via SUBCUTANEOUS
  Administered 2022-06-28: 2 [IU] via SUBCUTANEOUS
  Administered 2022-06-28: 3 [IU] via SUBCUTANEOUS

## 2022-06-27 MED ORDER — LACTATED RINGERS IV BOLUS
500.0000 mL | Freq: Once | INTRAVENOUS | Status: AC
  Administered 2022-06-27: 500 mL via INTRAVENOUS

## 2022-06-27 MED ORDER — INSULIN GLARGINE-YFGN 100 UNIT/ML ~~LOC~~ SOLN
11.0000 [IU] | Freq: Every day | SUBCUTANEOUS | Status: DC
  Administered 2022-06-27: 11 [IU] via SUBCUTANEOUS
  Filled 2022-06-27 (×2): qty 0.11

## 2022-06-27 MED ORDER — DEXTROSE 50 % IV SOLN
1.0000 | Freq: Once | INTRAVENOUS | Status: AC
  Administered 2022-06-27: 50 mL via INTRAVENOUS
  Filled 2022-06-27: qty 50

## 2022-06-27 NOTE — Progress Notes (Signed)
Md notified at bedside about elevated BP 179/111. No new orders at this time

## 2022-06-27 NOTE — Inpatient Diabetes Management (Signed)
Inpatient Diabetes Program Recommendations  AACE/ADA: New Consensus Statement on Inpatient Glycemic Control (2015)  Target Ranges:  Prepandial:   less than 140 mg/dL      Peak postprandial:   less than 180 mg/dL (1-2 hours)      Critically ill patients:  140 - 180 mg/dL   Lab Results  Component Value Date   GLUCAP 260 (H) 06/27/2022   HGBA1C 9.8 (A) 02/10/2022    Review of Glycemic Control  Latest Reference Range & Units 06/27/22 00:17 06/27/22 04:08 06/27/22 07:49 06/27/22 08:08 06/27/22 08:53  Glucose-Capillary 70 - 99 mg/dL 161 (H) 096 (H) 53 (L) 51 (L) 260 (H)  (H): Data is abnormally high (L): Data is abnormally low  Diabetes history: DM1(does not make insulin.  Needs correction, basal and meal coverage)  Outpatient Diabetes medications: Basaglar 22 units QAM, Humalog 8 units TID with meals if >150 mg/dL  Current orders for Inpatient glycemic control: Semglee 11 units QD, Novolog 0-9 units TID   Spoke with patient at bedside.  He was diagnosed with T1DM at age 37.  He confirms above home medications.  He states he gives himself rapid insulin even if he does not eat because he states "I need it and I don't feel well if I don't give it".  He has never administered Humalog using a sliding scale.  Explained what a sliding scale is and it's purpose to correct his blood glucose.  He would need meal coverage as well for his carbohydrates.  He does not count carbohydrates.  He states he tries to eat well.  He states he is either really high or low.  Has had several ED visits for hypoglycemia.  He is currently without a job because he was laid off for hypoglycemia at work.  He is crying during our conversation and states "I just don't know what to do".    Unfortunately, he is not insured.  He has tried to apply for Medicaid but has not qualified.  He is current with Internal Medicine.  They help him obtain his insulins.  He uses a [employer] glucometer.    Please consider for  discharge:  Sensitive correction scale TID  Humalog 4 units TID with meals Resume Basaglar 22 units QAM  Will continue to follow while inpatient.  Thank you, Juan Sellar, MSN, CDCES Diabetes Coordinator Inpatient Diabetes Program [phone] (team pager from 8a-5p)

## 2022-06-27 NOTE — Progress Notes (Signed)
Blood Glucose recheck 51,dextrose 50ml ampule given, MD in route to bedside

## 2022-06-27 NOTE — Plan of Care (Signed)
  Problem: Education: Goal: Knowledge of General Education information will improve Description: Including pain rating scale, medication(s)/side effects and non-pharmacologic comfort measures Outcome: Progressing   Problem: Clinical Measurements: Goal: Ability to maintain clinical measurements within normal limits will improve Outcome: Progressing Goal: Will remain free from infection Outcome: Progressing   Problem: Activity: Goal: Risk for activity intolerance will decrease Outcome: Progressing   Problem: Nutrition: Goal: Adequate nutrition will be maintained Outcome: Progressing   Problem: Pain Managment: Goal: General experience of comfort will improve Outcome: Progressing   Problem: Safety: Goal: Ability to remain free from injury will improve Outcome: Progressing   Problem: Skin Integrity: Goal: Risk for impaired skin integrity will decrease Outcome: Progressing   

## 2022-06-27 NOTE — Progress Notes (Signed)
Patient with nursing due to patient distress and wanting to leave.  Went to evaluate the patient and had a long discussion about his mental health.  He is very distressed and frustrated with his predicament and the things he has to deal with.  He has had multiple issues with obtaining Medicaid despite doing what he is told to do to obtain it.  He also has multiple other stressors including his type 1 diabetes and HIV in addition to social stressors.  He has talked to therapist and counselors multiple times in the past with some mild improvement but notes that it is transient and that medications have never really worked for him.  Medications usually make him feel off and do not actually correct his mood.  I offered an acute medication for his anxiety which he declined for tonight.  I will ensure our social workers and case managers know about his issues with Medicaid briefly described below.  He has applied for Medicaid several times including just earlier this month.  He received notice that he does not qualify for Medicaid due to not having a high enough deductible.  He has worked with social workers recently on this and has been told that he has not incurred enough medical cost perhaps to qualify for Medicaid.  He has not noticed that he was approved for [facility] family-planning which does not have any significant benefits.  He is not able to get his diabetes supplies that he needs including insulin pump and CGM.  As above this frustration has caused him much distress.  Rocky Morel, DO Internal Medicine Resident, PGY-1 Pager# [phone] Please contact the on call pager after 5 pm and on weekends at [phone].

## 2022-06-27 NOTE — Progress Notes (Signed)
Md notified of blood glucose of 53. New orders applied

## 2022-06-27 NOTE — Progress Notes (Signed)
Hospital day#0 Subjective:   Summary: [name] is a 36-yo with severe depression , HIV, type 1 diabetes admitted to IMTS service for management of symptomatic hypoglycemia  Overnight Events: patient with one episode of nausea that resolved after 2x dose of zofran. One episode of hypoglycemia earlier this AM treated with D50x1.   Interim history: Patient was significant tearful and frustrated with hospital course and with his disease process overall. Patient expressed lack of understanding of why the hypoglycemia episodes continue happening even if he is here in the hospital. Feels as though he was blamed for taking medications prescribed to him.  Patient with multiple stressors at home. Expressed recently speaking with family members and becoming overwhelmed.   Patient initially mentioning that he wished to go home. Agreed to continued monitoring of his blood glucose with alterations in insulin dosing through this afternoon. Denies pain, nausea, vomiting, abdominal pain. Was able to eat last night and this AM.   Objective:  Vital signs in last 24 hours: Vitals:   06/26/22 1414 06/26/22 1952 06/27/22 0417 06/27/22 0810  BP: (!) 153/91 (!) 142/102 (!) 155/106 (!) 179/111  Pulse: 79 80 63 68  Resp: 18 17 16 18   Temp: 98.5 F (36.9 C) 98.6 F (37 C) 98.3 F (36.8 C) 98.4 F (36.9 C)  TempSrc: Oral  Oral   SpO2: 100% 99% 100% 100%  Weight:      Height:       Supplemental O2: Room air SpO2: 100 % Filed Weights   06/26/22 0320  Weight: 89 kg    Physical Exam:  Constitutional: Young male in emotional distress Neurological: alert & oriented x 3 Skin: warm and dry Psych: Depressed mood and affect  Deferred the rest of the physical exam in the setting of emotional distress   Intake/Output Summary (Last 24 hours) at 06/27/2022 1155 Last data filed at 06/27/2022 1100 Gross per 24 hour  Intake 960 ml  Output 0 ml  Net 960 ml   Net IO Since Admission: 1,759.2  mL [06/27/22 1155]  Pertinent Labs:    Latest Ref Rng & Units 06/27/2022    1:48 AM 06/26/2022    4:05 AM 03/21/2022    8:18 PM  CBC  WBC 4.0 - 10.5 K/uL 9.3  8.8  11.0   Hemoglobin 13.0 - 17.0 g/dL 13.0  86.5  78.4   Hematocrit 39.0 - 52.0 % 40.0  40.1  40.2   Platelets 150 - 400 K/uL 257  272  246        Latest Ref Rng & Units 06/27/2022    1:48 AM 06/26/2022    4:05 AM 03/21/2022    8:18 PM  CMP  Glucose 70 - 99 mg/dL 696  46  295   BUN 6 - 20 mg/dL 18  16  12    Creatinine 0.61 - 1.24 mg/dL 2.84  1.32  4.40   Sodium 135 - 145 mmol/L 134  141  138   Potassium 3.5 - 5.1 mmol/L 3.7  3.5  4.4   Chloride 98 - 111 mmol/L 102  105  116   CO2 22 - 32 mmol/L 25  25  17    Calcium 8.9 - 10.3 mg/dL 8.8  9.6  6.4   Total Protein 6.5 - 8.1 g/dL  6.9  4.9   Total Bilirubin 0.3 - 1.2 mg/dL  0.9  0.5   Alkaline Phos 38 - 126 U/L  53  27   AST 15 -  41 U/L  21  19   ALT 0 - 44 U/L  15  10    Urinalysis    Component Value Date/Time   COLORURINE YELLOW 06/26/2022 0400   APPEARANCEUR CLEAR 06/26/2022 0400   LABSPEC 1.028 06/26/2022 0400   PHURINE 5.0 06/26/2022 0400   GLUCOSEU >=500 (A) 06/26/2022 0400   HGBUR MODERATE (A) 06/26/2022 0400   BILIRUBINUR NEGATIVE 06/26/2022 0400   KETONESUR 5 (A) 06/26/2022 0400   PROTEINUR >=300 (A) 06/26/2022 0400   UROBILINOGEN 0.2 02/10/2015 1416   NITRITE NEGATIVE 06/26/2022 0400   LEUKOCYTESUR NEGATIVE 06/26/2022 0400     Imaging: No results found.  Assessment/Plan:   Principal Problem:   Hypoglycemia Active Problems:   Diabetes mellitus type 1 (HCC)   HIV infection (HCC)   MDD (major depressive disorder), recurrent episode, severe (HCC)  Symptomatic hypoglycemia  Type 1 diabetes Nausea, vomiting - resolved Another documented hypoglycemic episode to 53, treated with D50 ampule. Suspect this in the setting of Lantus dosing at 22 and SSI. Will decreased Lantus by 50 % today and switch to sensitive SSI as patient continues to regain  appetite. Patient with improvement in N/V today - Lantus 11 today - Sensitive SSI - Zofran PRN  AKI on CKD Hematuria Cr. Elevated today to 1.81 from 1.42.  U/A with evidence of proteinuria and hematuria. No casts seen. CT abdomen on admission without signs of hydronephrosis, nephrolithiasis, or obstruction. Proteinuria seen in prior studies. New onset hematuria today. No signs of nephrotic syndrome. Patient with multiple co-morbidities with high risk of glomerulonephritis. Suspect worsening kidney function today is in setting of low po intake secondary to presenting symptoms. Will quatify proteinuria, but favor following this in the outpatient setting.  - Urine protein to creatinine ratio - Fluid resuscitation with IVF LR and PO intake - Outpatient hematuria follow up   Hypertension Previously prescribed Hydralazine and valsartan-HCTZ but patient denies taking them.  Given AKI and hypertension during this admission, will trial amlodipine at this time. Will likely transition to ARB at discharge for renal protection. -Amlodipine 10 mg  Severe depression  Emotional distress today with ongoing symptoms and home stressors. Patient with limited distress tolerance and difficulty coping with current stressors.  - Continue home sertraline 100 mg - Refer to psychotherapy services at discharge  Diet: Carb-Modified VTE: NOAC Code: DNR   Dispo: Anticipated discharge to Home in 1 days pending clinical improvement.   Morene Crocker, MD Internal Medicine Resident PGY-1 Please contact the on call pager after 5 pm and on weekends at [phone].

## 2022-06-27 NOTE — TOC Initial Note (Signed)
Transition of Care [facility]) - Initial/Assessment Note    Patient Details  Name: [name] MRN: [id number] Date of Birth: 12/11/1985  Transition of Care [facility]) CM/SW Contact:    Durenda Guthrie, RN Phone Number: 06/27/2022, 11:54 AM  Clinical Narrative:                 Patient is 37 yr old male, from home with N/V. Hypoglycemia.  Transition of Care [facility]) Department has reviewed patient and no TOC needs have been identified at this time. We will continue to monitor patient advancement through Interdisciplinary progressions and if new patient needs arise, please place a consult.        Patient Goals and CMS Choice            Expected Discharge Plan and Services                                              Prior Living Arrangements/Services                       Activities of Daily Living Home Assistive Devices/Equipment: None ADL Screening (condition at time of admission) Patient's cognitive ability adequate to safely complete daily activities?: Yes Is the patient deaf or have difficulty hearing?: No Does the patient have difficulty seeing, even when wearing glasses/contacts?: No Does the patient have difficulty concentrating, remembering, or making decisions?: No Patient able to express need for assistance with ADLs?: No Does the patient have difficulty dressing or bathing?: No Independently performs ADLs?: Yes (appropriate for developmental age) Does the patient have difficulty walking or climbing stairs?: No Weakness of Legs: Both Weakness of Arms/Hands: Both  Permission Sought/Granted                  Emotional Assessment              Admission diagnosis:  Hypoglycemia [E16.2] Nausea and vomiting, unspecified vomiting type [R11.2] Patient Active Problem List   Diagnosis Date Noted   Hypoglycemia 03/21/2022   Hypocalcemia 03/21/2022   Routine screening for STI (sexually transmitted infection) 02/03/2022   Essential  hypertension 09/28/2020   History of suicidal ideation 02/09/2020   MDD (major depressive disorder), recurrent episode, severe (HCC) 01/15/2020   Healthcare maintenance 12/05/2019   Syphilis 01/15/2019   Encounter for long-term (current) use of medications 06/09/2015   HIV infection (HCC) 11/11/2014   Stress 12/19/2012   Asthma 10/02/2010   Diabetes mellitus type 1 (HCC) 03/01/1991   PCP:  Storm Frisk, MD Pharmacy:   [id number] - [place], [place] - [address] AT [facility] OF [address] & [address] [place] [address] Phone: [phone] Fax: [phone]  [name] [address] [place] [address] Phone: [phone] Fax: [phone]  [facility] [address], [address] [place] [address] Phone: [phone] Fax: [phone]  [facility] [address] [place] - [address] [place] [address] Phone: [phone] Fax: [phone]  [id number] - [place], [place] - [address] AT [facility] OF [address] [facility] [address] [place] [address] Phone: [phone] Fax: [phone]     Social Determinants of Health (SDOH) Social History: SDOH Screenings  Food Insecurity: No Food Insecurity (06/26/2022)  Housing: Low Risk  (06/26/2022)  Transportation Needs: No Transportation Needs (06/26/2022)  Utilities: Not At Risk (06/26/2022)  Depression (PHQ2-9): High Risk (02/10/2022)  Tobacco Use: High Risk (06/26/2022)   SDOH Interventions:     Readmission Risk Interventions    01/15/2020    2:28 PM  Readmission Risk Prevention Plan  Transportation Screening Complete  PCP or Specialist Appt within 5-7 Days Complete  Home Care Screening Complete  Medication Review (RN CM) Complete

## 2022-06-28 ENCOUNTER — Other Ambulatory Visit (HOSPITAL_COMMUNITY): Payer: Self-pay

## 2022-06-28 ENCOUNTER — Other Ambulatory Visit: Payer: Self-pay

## 2022-06-28 DIAGNOSIS — Z794 Long term (current) use of insulin: Secondary | ICD-10-CM

## 2022-06-28 DIAGNOSIS — E10649 Type 1 diabetes mellitus with hypoglycemia without coma: Secondary | ICD-10-CM | POA: Diagnosis not present

## 2022-06-28 LAB — GLUCOSE, CAPILLARY
Glucose-Capillary: 159 mg/dL — ABNORMAL HIGH (ref 70–99)
Glucose-Capillary: 197 mg/dL — ABNORMAL HIGH (ref 70–99)
Glucose-Capillary: 201 mg/dL — ABNORMAL HIGH (ref 70–99)
Glucose-Capillary: 270 mg/dL — ABNORMAL HIGH (ref 70–99)

## 2022-06-28 LAB — BASIC METABOLIC PANEL
Anion gap: 8 (ref 5–15)
BUN: 15 mg/dL (ref 6–20)
CO2: 25 mmol/L (ref 22–32)
Calcium: 9.5 mg/dL (ref 8.9–10.3)
Chloride: 104 mmol/L (ref 98–111)
Creatinine, Ser: 1.42 mg/dL — ABNORMAL HIGH (ref 0.61–1.24)
GFR, Estimated: >60 mL/min (ref >60)
Glucose, Bld: 165 mg/dL — ABNORMAL HIGH (ref 70–99)
Potassium: 4.4 mmol/L (ref 3.5–5.1)
Sodium: 137 mmol/L (ref 135–145)

## 2022-06-28 MED ORDER — INSULIN GLARGINE-YFGN 100 UNIT/ML ~~LOC~~ SOLN
13.0000 [IU] | Freq: Every day | SUBCUTANEOUS | Status: DC
  Administered 2022-06-28: 13 [IU] via SUBCUTANEOUS
  Filled 2022-06-28: qty 0.13

## 2022-06-28 MED ORDER — SERTRALINE HCL 50 MG PO TABS
50.0000 mg | ORAL_TABLET | Freq: Once | ORAL | Status: AC
  Administered 2022-06-28: 50 mg via ORAL
  Filled 2022-06-28: qty 1

## 2022-06-28 MED ORDER — SERTRALINE HCL 100 MG PO TABS
150.0000 mg | ORAL_TABLET | Freq: Every day | ORAL | 3 refills | Status: DC
  Filled 2022-06-28: qty 45, 30d supply, fill #0

## 2022-06-28 MED ORDER — SERTRALINE HCL 50 MG PO TABS: 150.0000 mg | ORAL_TABLET | Freq: Every day | ORAL | Status: DC

## 2022-06-28 MED ORDER — INSULIN LISPRO (1 UNIT DIAL) 100 UNIT/ML (KWIKPEN)
10.0000 [IU] | PEN_INJECTOR | SUBCUTANEOUS | 2 refills | Status: DC
  Filled 2022-06-28: qty 6, fill #0

## 2022-06-28 MED ORDER — BASAGLAR KWIKPEN 100 UNIT/ML ~~LOC~~ SOPN
13.0000 [IU] | PEN_INJECTOR | Freq: Every day | SUBCUTANEOUS | 0 refills | Status: DC
  Filled 2022-06-28: qty 6, 30d supply, fill #0

## 2022-06-28 MED ORDER — INSULIN ASPART 100 UNIT/ML FLEXPEN
1.0000 [IU] | PEN_INJECTOR | SUBCUTANEOUS | 11 refills | Status: DC
  Filled 2022-06-28: qty 3, 30d supply, fill #0
  Filled 2022-06-28: qty 3, 34d supply, fill #0
  Filled 2022-06-28: qty 3, 28d supply, fill #0
  Filled 2022-07-21: qty 3, 28d supply, fill #1

## 2022-06-28 MED ORDER — INSULIN LISPRO (1 UNIT DIAL) 100 UNIT/ML (KWIKPEN)
1.0000 [IU] | PEN_INJECTOR | SUBCUTANEOUS | 11 refills | Status: DC
  Filled 2022-06-28: qty 9, 34d supply, fill #0
  Filled 2022-06-28: qty 6, 34d supply, fill #0
  Filled 2022-06-28: qty 15, fill #0
  Filled 2022-06-28 (×2): qty 6, 30d supply, fill #0

## 2022-06-28 MED ORDER — INSULIN GLARGINE-YFGN 100 UNIT/ML ~~LOC~~ SOPN
14.0000 [IU] | PEN_INJECTOR | Freq: Every day | SUBCUTANEOUS | 1 refills | Status: DC
  Filled 2022-06-28: qty 3, 21d supply, fill #0
  Filled 2022-07-21: qty 3, 21d supply, fill #1

## 2022-06-28 MED ORDER — INSULIN LISPRO (1 UNIT DIAL) 100 UNIT/ML (KWIKPEN)
5.0000 [IU] | PEN_INJECTOR | Freq: Three times a day (TID) | SUBCUTANEOUS | 11 refills | Status: DC
  Filled 2022-06-28: qty 15, 100d supply, fill #0

## 2022-06-28 MED ORDER — BASAGLAR KWIKPEN 100 UNIT/ML ~~LOC~~ SOPN
14.0000 [IU] | PEN_INJECTOR | Freq: Every day | SUBCUTANEOUS | 1 refills | Status: DC
  Filled 2022-06-28 (×2): qty 3, 21d supply, fill #0

## 2022-06-28 NOTE — Plan of Care (Signed)

## 2022-06-28 NOTE — TOC Transition Note (Signed)
Transition of Care [facility]) - CM/SW Discharge Note   Patient Details  Name: [name] MRN: [id number] Date of Birth: 12-May-1985  Transition of Care [facility]) CM/SW Contact:  Lawerance Sabal, RN Phone Number: 06/28/2022, 12:05 PM   Clinical Narrative:      Patient active w [facility], requested CMA to schedule follow up as soon as possible.  Patient gets his meds through [employer] fund at [facility] pharmacy per our [employer]. They have requested MD to send meds to [facility] to be filled through the fund.        Patient Goals and CMS Choice      Discharge Placement                         Discharge Plan and Services Additional resources added to the After Visit Summary for                                       Social Determinants of Health (SDOH) Interventions SDOH Screenings   Food Insecurity: No Food Insecurity (06/26/2022)  Housing: Low Risk  (06/26/2022)  Transportation Needs: No Transportation Needs (06/26/2022)  Utilities: Not At Risk (06/26/2022)  Depression (PHQ2-9): High Risk (02/10/2022)  Tobacco Use: High Risk (06/26/2022)     Readmission Risk Interventions    01/15/2020    2:28 PM  Readmission Risk Prevention Plan  Transportation Screening Complete  PCP or Specialist Appt within 5-7 Days Complete  Home Care Screening Complete  Medication Review (RN CM) Complete

## 2022-06-28 NOTE — Discharge Summary (Signed)
Name: [name] MRN: [id number] DOB: Dec 13, 1985 37 y.o. PCP: Storm Frisk, MD  Date of Admission: 06/26/2022  3:09 AM Date of Discharge: 06/28/2022 11:43 AM Attending Physician: Dr. Oswaldo Done  Discharge Diagnosis: Principal Problem:   Hypoglycemia Active Problems:   Diabetes mellitus type 1 (HCC)   HIV infection (HCC)   MDD (major depressive disorder), recurrent episode, severe (HCC)   Hypoglycemia due to insulin    Discharge Medications: Allergies as of 06/28/2022       Reactions   Shellfish Allergy Anaphylaxis, Hives   Aspirin Other (See Comments)   Lump in armpit        Medication List     STOP taking these medications    Basaglar KwikPen 100 UNIT/ML Replaced by: insulin glargine-yfgn 100 UNIT/ML Pen   Sodium Fluoride 5000 Plus 1.1 % Crea dental cream Generic drug: sodium fluoride       TAKE these medications    blood glucose meter kit and supplies Dispense based on patient and insurance preference. Use up to four times daily as directed: For blood sugar monitoring   Genvoya 150-150-200-10 MG Tabs tablet Generic drug: elvitegravir-cobicistat-emtricitabine-tenofovir Take 1 tablet by mouth daily with breakfast.   hydrALAZINE 25 MG tablet Commonly known as: APRESOLINE Take 1 tablet (25 mg total) by mouth every 8 (eight) hours. For hypertension   insulin glargine-yfgn 100 UNIT/ML Pen Commonly known as: Semglee (yfgn) Inject 14 Units into the skin daily. Replaces: Basaglar KwikPen 100 UNIT/ML   insulin lispro 100 UNIT/ML KwikPen Commonly known as: HUMALOG Inject 1-6 Units into the skin as directed. ONLY with food - 1 unit per 15 grams of carbohydrates (bread, pasta, fruit, etc) What changed:  how much to take when to take this additional instructions   sertraline 100 MG tablet Commonly known as: ZOLOFT Take 1.5 tablets (150 mg total) by mouth daily. Start taking on: Jun 29, 2022 What changed: how much to take   True Metrix Blood  Glucose Test test strip Generic drug: glucose blood use up to 4 times daily as directed for blood sugar monitoring   TRUEplus 5-Bevel Pen Needles 31G X 5 MM Misc Generic drug: Insulin Pen Needle Use with insulin pen   valsartan-hydrochlorothiazide 320-25 MG tablet Commonly known as: DIOVAN-HCT Take 1 tablet by mouth daily.        Disposition and follow-up:   [name] was discharged from [facility] in [place] condition.  At the hospital follow up visit please address:  1.  Follow-up:  *Type 1 Diabetes *Symptomatic hypoglycemia -Monitor blood glucose -increased LA insulin to 14 units daily -Carb correction with humalog 1:15 ratio -Consider referral to endocrinology   *AKI  *Diabetic nephropathy *Hematuria -Monitor BMP at follow up -Repeat U/A and monitor until resolution of hematuria -Would benefit from ARB  *Hypertension  -Monitor blood pressure -Would benefit from re initiation of therapy  *Severe MDD -Monitor medication adherance and uptitration of therapy -Referral to psychotherapy  *HIV *Syphilis -Ensure follow up with Dr. Earlene Plater in June -Repeat RPR and Bacillin treatment   SDoH -Assistance for Medicaid application -Food insecurity   2.  Labs / imaging needed at time of follow-up: BMP, Blood glucose, U/A  3.  Pending labs/ test needing follow-up: none  4.  Medication Changes  MODIFIED  - Basaglar to Semglee - 14 units daily  - Humolog - with meals with carbohydrate ratio 1:15  - Sertraline increased to 150 mg daily  Follow-up Appointments:  Follow-up Information  Anders Simmonds, PA-C Follow up.   Specialty: Family Medicine Why: TIME :   1:30 PM DSATE : MAY 16 ,2024 Contact information: [address] [place] [address] [phone]                 Hospital Course by problem list: [name] is a 36-yo with severe depression , HIV, type 1 diabetes admitted to IMTS  service for management of symptomatic hypoglycemia   Type 1 Diabetes Symptomatic hypoglycemia Type 1 DM diagnosed during childhood with intermittent access to medical care. Prior to admission insulin regimen 22 units Lantus daily and 10 units of short acting BID with meals. This admission was patient's 4th visit for symptomatic hypoglycemia in the past 6 months. Etiology of hypoglycemia in the setting of administration of both long acting and short acting insulin with no or low oral intake. Patient did not check his blood sugars prior to self administering insulin. [name] was treated with D50 during episodes of hypoglycemia and with lower doses of long acting insulin. He received diabetes education, a continuous glucose meter. Suspect patient's poor appetite in setting of severe depression versus gastroparesis. Today patient denied nausea or vomiting and no documented hypoglycemia in 24 hours. He was discharged with a lower dose, 14 units, of long acting insulin, as well as a short acting insulin with carbohydrate ratio of 1:15. Medications were sent to his preferred pharmacy for coverage.  AKI on CKD Diabetic nephropathy Hematuria Patient with Cr 1.42 on admission, which peaked at 1.8. U/A with evidence of proteinuria and hematuria. No casts seen. CT abdomen on admission without signs of hydronephrosis, nephrolithiasis, or obstruction. Proteinuria seen in prior studies. New onset hematuria during this admission. Urine protein to creatinine ratio of 1. Given patient's history, likely in the setting of diabetic nephropathy. Suspect worsening kidney function in setting of poor po intake. Discharge Cr 1.42, improved. Attempted ARB re initiation at discharge; patient denied. Recommend follow up uranalysis to re-evaluate for hematuria.  Hypertension  Treated with amlodipine during this admission. Counseled patient on protective benefits of treating hypertension given his his DM and renal function.  Recommend continued counsel and re-initiation of therapy at discharge  Severe MDD SDoH Patient with multiple socioeconomic stressors now undergoing grieving process for his grandmother. Has been unhomed in the past, now living with friends. He has been taking Sertraline 100 mg since 01/2022. Patient with labile mood during this admission having difficulty coping or managing his medical condition. Question patient's medical literacy as well. Increased Sertraline dosing to 150 mg daily and shared information about local organizations that receive walk-in/ urgent care services for psychotherapy support.   HIV Syphilis ollowed at ID clinic with Dr. Earlene Plater, last seen in December 2023. 02/03/2022 HIV RNA quant not detected, CD4 T cells 520. Last filled 05/10/2022. Of note, Patient Patient has had a history of syphilis, previously treated in the outpatient setting with Bicillin in September of 2022 after a 1:32 titer. His last titer after treatment in 01/2022 (1.3 years later) was 1:16. Per guidelines, a 4-fold change in titer is considered adequate therapeutic response, which in this patient case would be 1:8. Ensure follow up with Dr. Earlene Plater in June 2024  Dental decay This patient would benefit of outpatient dental services  Discharge Subjective: Patient feeling physical improvement since arrival at the hospital. Denies nausea, vomiting, GI disturbances. Patient endorsing mild increase in appetite. Friend at beside with him. Patient  willing to meet with diabetes educator,  understanding new insulin dosing regimen, and open to following up at [facility] after discharge  Discharge Exam:   Blood pressure (!) 169/111, pulse 73, temperature 98.9 F (37.2 C), temperature source Oral, resp. rate 16, height 6' (1.829 m), weight 89 kg, SpO2 100 %.  Constitutional: No acute distress HENT: normocephalic atraumatic, mucous membranes moist Cardiovascular: regular rate and rhythm, no  m/r/g Pulmonary/Chest: normal work of breathing on room air, lungs clear to auscultation bilaterally. No crackles  Abdominal: soft, non-tender, non-distended. Neurological: alert & oriented x 3 MSK:No pitting edema Skin: warm and dry Psych: Sad mood and affect  Pertinent Labs, Studies, and Procedures:     Latest Ref Rng & Units 06/27/2022    1:48 AM 06/26/2022    4:05 AM 03/21/2022    8:18 PM  CBC  WBC 4.0 - 10.5 K/uL 9.3  8.8  11.0   Hemoglobin 13.0 - 17.0 g/dL 40.9  81.1  91.4   Hematocrit 39.0 - 52.0 % 40.0  40.1  40.2   Platelets 150 - 400 K/uL 257  272  246        Latest Ref Rng & Units 06/28/2022    8:05 AM 06/27/2022    1:48 AM 06/26/2022    4:05 AM  CMP  Glucose 70 - 99 mg/dL 782  956  46   BUN 6 - 20 mg/dL 15  18  16    Creatinine 0.61 - 1.24 mg/dL 2.13  0.86  5.78   Sodium 135 - 145 mmol/L 137  134  141   Potassium 3.5 - 5.1 mmol/L 4.4  3.7  3.5   Chloride 98 - 111 mmol/L 104  102  105   CO2 22 - 32 mmol/L 25  25  25    Calcium 8.9 - 10.3 mg/dL 9.5  8.8  9.6   Total Protein 6.5 - 8.1 g/dL   6.9   Total Bilirubin 0.3 - 1.2 mg/dL   0.9   Alkaline Phos 38 - 126 U/L   53   AST 15 - 41 U/L   21   ALT 0 - 44 U/L   15     CT ABDOMEN PELVIS W CONTRAST  Result Date: 06/26/2022 CLINICAL DATA:  Epigastric pain EXAM: CT ABDOMEN AND PELVIS WITH CONTRAST TECHNIQUE: Multidetector CT imaging of the abdomen and pelvis was performed using the standard protocol following bolus administration of intravenous contrast. RADIATION DOSE REDUCTION: This exam was performed according to the departmental dose-optimization program which includes automated exposure control, adjustment of the mA and/or kV according to patient size and/or use of iterative reconstruction technique. CONTRAST:  50mL OMNIPAQUE IOHEXOL 350 MG/ML SOLN COMPARISON:  10/20/2019 FINDINGS: Lower chest:  No contributory findings. Hepatobiliary: No focal liver abnormality.No evidence of biliary obstruction or stone. Pancreas:  Unremarkable. Spleen: Unremarkable. Adrenals/Urinary Tract: Negative adrenals. No hydronephrosis or stone. Unremarkable bladder. Stomach/Bowel:  No obstruction. No appendicitis. Vascular/Lymphatic: No acute vascular abnormality. No mass or adenopathy. Reproductive:No pathologic findings. Other: No ascites or pneumoperitoneum. Musculoskeletal: No acute abnormalities. IMPRESSION: No acute finding or explanation for symptoms. Electronically Signed   By: Tiburcio Pea M.D.   On: 06/26/2022 06:12     Discharge Instructions: Discharge Instructions     Call MD for:  difficulty breathing, headache or visual disturbances   Complete by: As directed    Call MD for:  extreme fatigue   Complete by: As directed    Call MD for:  persistant dizziness or light-headedness   Complete by: As  directed    Call MD for:  persistant nausea and vomiting   Complete by: As directed    Increase activity slowly   Complete by: As directed        Signed: Morene Crocker, MD [name] - PGY1 Pager: [phone] 06/28/2022, 11:43 AM    Please contact the on call pager after 5 pm and on weekends at [phone].

## 2022-06-28 NOTE — Inpatient Diabetes Management (Signed)
Inpatient Diabetes Program Recommendations  AACE/ADA: New Consensus Statement on Inpatient Glycemic Control (2015)  Target Ranges:  Prepandial:   less than 140 mg/dL      Peak postprandial:   less than 180 mg/dL (1-2 hours)      Critically ill patients:  140 - 180 mg/dL   Lab Results  Component Value Date   GLUCAP 159 (H) 06/28/2022   HGBA1C 9.8 (A) 02/10/2022    Review of Glycemic Control  Latest Reference Range & Units 06/27/22 16:00 06/27/22 20:50 06/28/22 00:09 06/28/22 04:01 06/28/22 07:27  Glucose-Capillary 70 - 99 mg/dL 161 (H) 096 (H) 045 (H) 197 (H) 159 (H)  (H): Data is abnormally high  Diabetes history: DM1(does not make insulin.  Needs correction, basal and meal coverage)   Outpatient Diabetes medications: Basaglar 22 units QAM, Humalog 8 units TID with meals if >150 mg/dL   Current orders for Inpatient glycemic control: Semglee 11 units QD, Novolog 0-9 units TID  Please consider 1 unit: 15 carbohydrates TID with each meal and continue basal insulin QD   Patient is discharging today.   Asked MD for order to place [facility] 3 and provide him with a couple of extra.  Patient is very grateful for these cgm's.  His roommate is at bedside.  He downloaded the FSL 3 app on his Android.  Educated him and friend on the CGM, how it works, waterproof, how to remove, etc.  Roommate successfully placed CGM to the back of his left arm.  CGM will warm up for 60 minutes and will then display his serum glucose every 5 mins.  Provided him with 2 extras.  Each CGM should last 14 days.  Increased low alarm from 70 mg/dL to 90 mg/dL to allow for early treatment of hypoglycemia.  Explained arrows.  If arrow is going straight down his glucose is dropping quickly.  Explained he should use these arrows along with the glucose number to treat lows.  The high alarm is set for 250 mg/dL.   He can share his FSL 3 readings with his roommate if he wants.  His roommate could download the [employer] up  app to start.   He verbalizes understanding.    Educated patient at length on carbohydrate counting.  Asked him to download the Calorie [employer] app on his phone.  Showed him how to enter foods in Calorie [place] and see how many carbs are in the food he entered.  He is really excited about this app.    He has seen Dr. Evlyn Kanner in the past.  Has not been able to see him without insurance.  Hopefully he can qualify for Medicaid very soon.  He will make an appointment with Dr. Evlyn Kanner once he is approved.  He wants an insulin pump.  We discussed the new closed loop system and he is very interested in this.    He should call [facility] if he has glucose values consistently > 200 or <100 mg/dL.  Reviewed hypoglycemia, signs, symptoms and treatments.    He is very grateful for the CGM and education.    Will continue to follow while inpatient.  Thank you, Dulce Sellar, MSN, CDCES Diabetes Coordinator Inpatient Diabetes Program [phone] (team pager from 8a-5p)

## 2022-06-28 NOTE — Discharge Instructions (Addendum)
[name] -  You were admitted to the hospital because your blood sugars were too low. We have modified your insulin therapy to provide you with coverage for your type 1 diabetes and decrease the risk of low blood sugar.  DIABETES -Basaglar - long acting insulin - inject 13 units into your skin every day without missing doses  Insulin Lispro - Humalog - short acting -Use this insulin only when you eat. Do not use if you DONT eat -As the diabetes educator instructed, for every approximate 15 grams of carbs (pasta, potatoes, rice, fruit), inject 1 unit of insulin into your skin  DEPRESSION Sertraline (Zoloft) - we have increased the dose at 150 mg daily.  High blood pressure - please take your blood pressure at home or at  the pharmacy to know what your blood pressures are outside of the hospital.   Follow up with your primary care provider in the next 2 weeks. If you have not been called for an appoitnment or are unable to schedule one at their office, please call at [phone] and make an appoitnment with the Internal Medicine Center. We want to make sure that all your medical issues continue to improve and help you get in the right direction if they need more care.  Please call [phone] if your mood worsens or start having suicidal thoughts. Surround yourself with people. The 3 services below provide walk ins for mental health. I believe this would be helpful to you as you cope with many life stressors. There is help for you, [name]. One thing at a time.   [facility] [place] - preferred! URGENT CARE SERVICES - WALK IN Phone: [phone]  Address: [address] [place], [place] [address]  Hours:Open 24/7, No appointment required.  URGENT CARE SERVICES Assessment:  mental health evaluation, assessing immediate safety concerns and further mental health needs.  Referral: Resources, Connect to community-based mental health treatment, when indicated, including  psychotherapy, psychiatry and other specialized behavioral health or substance use disorder services (for those not already in treatment).  Transitional care: In-person assessment and/or virtual follow up during the patient's transition to connect them with the appropriate outpatient services.  OUTPATIENT SERVICES  Individual Therapy Partial Hospitalization Program (PHP) Substance Abuse Intensive Outpatient Program [facility]) Specialized Intensive Adult Group Therapy Medication Management Peer Living Room

## 2022-06-29 ENCOUNTER — Telehealth: Payer: Self-pay

## 2022-06-29 ENCOUNTER — Other Ambulatory Visit: Payer: Self-pay

## 2022-06-29 NOTE — Transitions of Care (Post Inpatient/ED Visit) (Signed)
06/29/2022  Name: [name] MRN: [id number] DOB: [birth year]  Today's TOC FU Call Status: Today's TOC FU Call Status:: Successful TOC FU Call Competed TOC FU Call Complete Date: 06/29/22  Transition Care Management Follow-up Telephone Call Date of Discharge: 06/28/22 Discharge Facility: [name]) Type of Discharge: Inpatient Admission Primary Inpatient Discharge Diagnosis:: hypoglycemia How have you been since you were released from the hospital?: Better (He stated he is feeling fine,) Any questions or concerns?: Yes Patient Questions/Concerns:: He has family planning Medicaid and said he has applied and has been denied full Medicaid , stating he was informed that he does not qulaify for disability and he would work   He last applied for Medicaid in April and said he has not heard from anyone  He does not have the name of the DSS  caseworker because that name was on the paperwork that he had to mail back to DSS.  I offered to ask our DSS Medicaid eligilbity caseworkers at [facility] if they can find out who his caseworker is. He was hesitant at first and said he would call him self but was then agreeable to having me contact the caseworkers for him. Patient Questions/Concerns Addressed: Other: (request sent to DSS Medicaid caseworkers for assistance wiht identifying patients caseworker.)  Items Reviewed: Did you receive and understand the discharge instructions provided?: No (He said he was not at home at the time of this call. He said he had the instructions and nothing was new but he had not reviewed them yet. I asked him to review when he had a chance and instructed him to call this office with any questions) Medications obtained,verified, and reconciled?: No (He said he has all meds because nothing is new.  I explained that there was a change with basaglar. He said he gives 22 units and 8 units daily. I explained that the AVS orders are 14 units daily.I  instructed him to please review all orders on AVS) Medications Not Reviewed Reasons:: Other: (He was very vague when answering questions about the meds.  He said he was given a CGM at the hospital but it stopped working so he is using the standard glucometer.) Any new allergies since your discharge?: No Dietary orders reviewed?: No Do you have support at home?: Yes  Medications Reviewed Today: Medications Reviewed Today     Reviewed by Larrie Kass, CPhT (Pharmacy Technician) on 06/26/22 at [phone]  Med List Status: Complete   Medication Order Taking? Sig Documenting Provider Last Dose Status Informant  blood glucose meter kit and supplies [id number]  Dispense based on patient and insurance preference. Use up to four times daily as directed: For blood sugar monitoring Armandina Stammer I, NP  Active Self  elvitegravir-cobicistat-emtricitabine-tenofovir (GENVOYA) 150-150-200-10 MG TABS tablet [id number] Yes Take 1 tablet by mouth daily with breakfast. Kathlynn Grate, DO 06/25/2022 Active Self  glucose blood (TRUE METRIX BLOOD GLUCOSE TEST) test strip [id number]  use up to 4 times daily as directed for blood sugar monitoring Storm Frisk, MD  Active Self  hydrALAZINE (APRESOLINE) 25 MG tablet [id number] Yes Take 1 tablet (25 mg total) by mouth every 8 (eight) hours. For hypertension Storm Frisk, MD 06/25/2022 am Active Self  Insulin Glargine [facility] KWIKPEN) 100 UNIT/ML [id number] Yes Inject 22 Units into the skin daily. Storm Frisk, MD 06/25/2022 am Active Self  insulin lispro (HUMALOG) 100 UNIT/ML KwikPen [id number] Yes Inject 10 Units into the skin 2 (two) times daily before  lunch and supper. Hold if blood sugar less than 150 Storm Frisk, MD 06/25/2022 am Active Self  Insulin Pen Needle 31G X 5 MM MISC [id number]  Use with insulin pen Storm Frisk, MD  Active Self  sertraline (ZOLOFT) 100 MG tablet [id number] Yes Take 1 tablet (100 mg total) by mouth daily. Storm Frisk, MD  06/25/2022 Active Self  sodium fluoride (PREVIDENT 5000 PLUS) 1.1 % CREA dental cream [id number] No Brush on teeth with a toothbrush after evening mouth care. Spit out excess and do not rinse  Patient not taking: Reported on 06/26/2022    Not Taking ran out Active Self  valsartan-hydrochlorothiazide (DIOVAN-HCT) 320-25 MG tablet [id number] Yes Take 1 tablet by mouth daily. Storm Frisk, MD 06/25/2022 Active Self           Med Note (WHITE, Cynda [place] Jun 26, 2022  6:09 AM) Patient stated that he was still taking this, but it hasn't been filled since 12/23 for #30 ds. There is also no record of it in [facility].             Home Care and Equipment/Supplies: Were Home Health Services Ordered?: No Any new equipment or medical supplies ordered?: No  Functional Questionnaire: Do you need assistance with bathing/showering or dressing?: No Do you need assistance with meal preparation?: No Do you need assistance with eating?: No Do you have difficulty maintaining continence: No Do you need assistance with getting out of bed/getting out of a chair/moving?: No Do you have difficulty managing or taking your medications?: No  Follow up appointments reviewed: PCP Follow-up appointment confirmed?: Yes Date of PCP follow-up appointment?: 07/14/22 Follow-up Provider: Corene Cornea, PA Specialist Hospital Follow-up appointment confirmed?: Yes Date of Specialist follow-up appointment?: 08/03/22 Follow-Up Specialty Provider:: Gwynn Burly, DO Do you need transportation to your follow-up appointment?: No Do you understand care options if your condition(s) worsen?: Yes-patient verbalized understanding    SIGNATURE Robyne Peers, RN

## 2022-06-29 NOTE — Telephone Encounter (Signed)
From the discharge call:  He stated he is feeling fine,.  He has family planning Medicaid and said he has applied and has been denied full Medicaid , stating he was informed that he does not qulaify for disability and he would work   He last applied for Medicaid in April and said he has not heard from anyone  He does not have the name of the DSS  caseworker because that name was on the paperwork that he had to mail back to DSS.  I offered to ask our DSS Medicaid eligilbity caseworkers at [facility] if they can find out who his caseworker is. He was hesitant at first and said he would call him self but was then agreeable to having me contact the caseworkers for him.   He said he was not at home at the time of this call. He said he had the discharge instructions and nothing was new but he had not reviewed them yet. I asked him to review when he had a chance and instructed him to call this office with any questions.  I explained that there was a change with basaglar. He said he gives 22 units and 8 units daily. I explained that the AVS orders are 14 units daily.I instructed him to please review all orders on AVS  He was very vague when answering questions about the meds.  He said he was given a CGM at the hospital but it stopped working so he is using the standard glucometer   Follow-up Provider: Corene Cornea, PA - 07/14/2022.

## 2022-06-29 NOTE — Telephone Encounter (Signed)
Please disregard the "Medications Reviewed Today" section on the [facility] call made today.  That is not the patient's medication list from his AVS at discharge.

## 2022-06-30 NOTE — Telephone Encounter (Signed)
I attempted to contact patient to inform him that I have obtained his Medicaid caseworker's name for him to call and follow up about his application. Message left with call back requested.   Caseworker: Kae Heller: [phone].

## 2022-07-08 ENCOUNTER — Encounter (HOSPITAL_COMMUNITY): Payer: Self-pay

## 2022-07-08 ENCOUNTER — Ambulatory Visit (INDEPENDENT_AMBULATORY_CARE_PROVIDER_SITE_OTHER): Payer: BLUE CROSS/BLUE SHIELD

## 2022-07-08 ENCOUNTER — Ambulatory Visit (HOSPITAL_COMMUNITY)
Admission: EM | Admit: 2022-07-08 | Discharge: 2022-07-08 | Disposition: A | Discharge status: Home / Self Care | Payer: BLUE CROSS/BLUE SHIELD | Attending: Family Medicine | Admitting: Family Medicine

## 2022-07-08 DIAGNOSIS — S92414A Nondisplaced fracture of proximal phalanx of right great toe, initial encounter for closed fracture: Secondary | ICD-10-CM

## 2022-07-08 MED ORDER — HYDROCODONE-ACETAMINOPHEN 5-325 MG PO TABS: 1.0000 | ORAL_TABLET | Freq: Four times a day (QID) | ORAL | 0 refills | Status: DC | PRN

## 2022-07-08 NOTE — ED Triage Notes (Signed)
Pt present right foot/big toe injury, pt states he dropped a weight on his right toe last night. Pt big toe is swollen and painful to move.

## 2022-07-08 NOTE — ED Provider Notes (Signed)
[facility]    CSN: [id number] Arrival date & time: 07/08/22  2952      History   Chief Complaint Chief Complaint  Patient presents with   Foot Injury    HPI [name] is a 37 y.o. male.    Foot Injury  Here for right foot and great toe pain.  Last evening he dropped a weight on his right distal foot.  He cannot tell me how big it was, but he tells me that what hurts more was when he hit the toe and foot on a step   Past medical history significant for type 1 diabetes and HIV, chronic kidney disease and unstable living situation  Past Medical History:  Diagnosis Date   Anxiety    Asthma    Closed displaced comminuted fracture of shaft of right tibia 11/01/2017   Closed fracture of base of fifth metatarsal bone of right foot 11/13/2017   Closed fracture of medial malleolus of right ankle 10/31/2017   Depression    Headache    "only w/stress" (11/01/2017)   HIV (human immunodeficiency virus infection) (HCC)    Migraine    "a few/month" (11/01/2017)   Refusal of blood transfusions as patient is Jehovah's Witness    Seizure (HCC)    "only w/my low blood sugars"  (11/01/2017)   Type I diabetes mellitus (HCC)     Patient Active Problem List   Diagnosis Date Noted   Hypoglycemia due to insulin 06/27/2022   Hypoglycemia 03/21/2022   Hypocalcemia 03/21/2022   Routine screening for STI (sexually transmitted infection) 02/03/2022   Essential hypertension 09/28/2020   History of suicidal ideation 02/09/2020   MDD (major depressive disorder), recurrent episode, severe (HCC) 01/15/2020   Healthcare maintenance 12/05/2019   Syphilis 01/15/2019   Encounter for long-term (current) use of medications 06/09/2015   HIV infection (HCC) 11/11/2014   Stress 12/19/2012   Asthma 10/02/2010   Diabetes mellitus type 1 (HCC) 03/01/1991    Past Surgical History:  Procedure Laterality Date   FRACTURE SURGERY     IM NAILING TIBIA Right 11/01/2017    INTRAMEDULLARY (IM) NAIL TIBIALRightGeneral   ORIF ANKLE FRACTURE Right 11/01/2017   ORIF ANKLE FRACTURE Right 11/01/2017   Procedure: OPEN REDUCTION INTERNAL FIXATION (ORIF) ANKLE FRACTURE;  Surgeon: Roby Lofts, MD;  Location: [facility] OR;  Service: Orthopedics;  Laterality: Right;   TIBIA IM NAIL INSERTION Right 11/01/2017   Procedure: INTRAMEDULLARY (IM) NAIL TIBIAL;  Surgeon: Roby Lofts, MD;  Location: [facility] OR;  Service: Orthopedics;  Laterality: Right;       Home Medications    Prior to Admission medications   Medication Sig Start Date End Date Taking? Authorizing Provider  HYDROcodone-acetaminophen (NORCO/VICODIN) 5-325 MG tablet Take 1 tablet by mouth every 6 (six) hours as needed (pain). 07/08/22  Yes Zenia Resides, MD  blood glucose meter kit and supplies Dispense based on patient and insurance preference. Use up to four times daily as directed: For blood sugar monitoring 10/08/20   Armandina Stammer I, NP  elvitegravir-cobicistat-emtricitabine-tenofovir (GENVOYA) 150-150-200-10 MG TABS tablet Take 1 tablet by mouth daily with breakfast. 02/03/22   Kathlynn Grate, DO  glucose blood (TRUE METRIX BLOOD GLUCOSE TEST) test strip use up to 4 times daily as directed for blood sugar monitoring 11/18/21   Storm Frisk, MD  hydrALAZINE (APRESOLINE) 25 MG tablet Take 1 tablet (25 mg total) by mouth every 8 (eight) hours. For hypertension 02/10/22   Storm Frisk,  MD  insulin glargine-yfgn (SEMGLEE) 100 UNIT/ML Pen Inject 14 Units into the skin daily. 06/28/22   Morene Crocker, MD  insulin lispro (HUMALOG) 100 UNIT/ML KwikPen Inject 1-6 Units into the skin as directed. ONLY with food - 1 unit per 15 grams of carbohydrates (bread, pasta, fruit, etc) 06/28/22   Morene Crocker, MD  insulin aspart (NOVOLOG) 100 UNIT/ML FlexPen Inject 1-6 Units into the skin as directed ONLY with food- 1 unit per 15 grams of carbohydrates (bread, pasta, fruit, etc) 06/28/22   Morene Crocker,  MD  Insulin Pen Needle 31G X 5 MM MISC Use with insulin pen 02/10/22   Storm Frisk, MD  sertraline (ZOLOFT) 100 MG tablet Take 1.5 tablets (150 mg total) by mouth daily. 06/29/22 10/27/22  Morene Crocker, MD  valsartan-hydrochlorothiazide (DIOVAN-HCT) 320-25 MG tablet Take 1 tablet by mouth daily. 02/10/22   Storm Frisk, MD  Insulin Glargine [facility] KWIKPEN) 100 UNIT/ML Inject 14 Units into the skin daily. 06/28/22 06/28/22  Morene Crocker, MD  pravastatin (PRAVACHOL) 20 MG tablet Take 1 tablet (20 mg total) by mouth daily. Patient not taking: Reported on 03/31/2019 03/08/19 07/08/19  Claiborne Rigg, NP    Family History Family History  Problem Relation Age of Onset   Thyroid disease Mother    Diabetes Mother    Breast cancer Maternal Grandmother    Diabetes Maternal Grandmother     Social History Social History   Tobacco Use   Smoking status: Some Days    Types: Cigars    Start date: 09/29/2015   Smokeless tobacco: Never   Tobacco comments:    11/01/2017  "2 cigarettes in ~ 6 month "  Vaping Use   Vaping Use: Former   Substances: THC  Substance Use Topics   Alcohol use: Not Currently    Alcohol/week: 7.0 standard drinks of alcohol    Types: 7 Cans of beer per week   Drug use: Yes    Frequency: 2.0 times per week    Types: Marijuana    Comment: 11/01/2017  "weekly"     Allergies   Shellfish allergy and Aspirin   Review of Systems Review of Systems   Physical Exam Triage Vital Signs ED Triage Vitals  Enc Vitals Group     BP 07/08/22 0846 (!) 143/88     Pulse Rate 07/08/22 0846 (!) 105     Resp 07/08/22 0846 18     Temp 07/08/22 0846 98.1 F (36.7 C)     Temp Source 07/08/22 0846 Oral     SpO2 07/08/22 0846 98 %     Weight --      Height --      Head Circumference --      Peak Flow --      Pain Score 07/08/22 0847 10     Pain Loc --      Pain Edu? --      Excl. in GC? --    No data found.  Updated Vital Signs BP (!) 143/88 (BP  Location: Left Arm)   Pulse (!) 105   Temp 98.1 F (36.7 C) (Oral)   Resp 18   SpO2 98%   Visual Acuity Right Eye Distance:   Left Eye Distance:   Bilateral Distance:    Right Eye Near:   Left Eye Near:    Bilateral Near:     Physical Exam Vitals reviewed.  Constitutional:      General: He is not in acute distress.  Appearance: He is not ill-appearing, toxic-appearing or diaphoretic.  Musculoskeletal:     Comments: There is swelling and tenderness of the right great toe, the area around the first MTP and the distal medial foot.    Skin:    Coloration: Skin is not pale.  Neurological:     General: No focal deficit present.     Mental Status: He is alert and oriented to person, place, and time.  Psychiatric:        Behavior: Behavior normal.      UC Treatments / Results  Labs (all labs ordered are listed, but only abnormal results are displayed) Labs Reviewed - No data to display  EKG   Radiology No results found.  Procedures Procedures (including critical care time)  Medications Ordered in UC Medications - No data to display  Initial Impression / Assessment and Plan / UC Course  I have reviewed the triage vital signs and the nursing notes.  Pertinent labs & imaging results that were available during my care of the patient were reviewed by me and considered in my medical decision making (see chart for details).        There is a spiral nondisplaced fracture of the proximal phalanx of the great toe.  Do not see a fracture of the metatarsal.  Hydrocodone is sent in for pain relief   With his being on Genvoya and some question of some reduced kidney function, I am not going to give him a Toradol injection or send in any anti-inflammatories.  Cast shoe was applied and crutches are supplied.  He is given contact information for podiatry Final Clinical Impressions(s) / UC Diagnoses   Final diagnoses:  Closed nondisplaced fracture of proximal phalanx  of right great toe, initial encounter     Discharge Instructions      There is a fracture of the first bone of your right big toe.  It is not displaced   Hydrocodone 5 mg--1 tablet every 6 hours as needed for pain.  This is best taken with food.  It can cause sleepiness or dizziness  Can elevate your foot and the toe in the next 2 days.     ED Prescriptions     Medication Sig Dispense Auth. Provider   HYDROcodone-acetaminophen (NORCO/VICODIN) 5-325 MG tablet Take 1 tablet by mouth every 6 (six) hours as needed (pain). 12 tablet Taygen Acklin, Janace Aris, MD      I have reviewed the PDMP during this encounter.   Zenia Resides, MD 07/08/22 [phone]

## 2022-07-08 NOTE — Discharge Instructions (Signed)
There is a fracture of the first bone of your right big toe.  It is not displaced   Hydrocodone 5 mg--1 tablet every 6 hours as needed for pain.  This is best taken with food.  It can cause sleepiness or dizziness  Can elevate your foot and the toe in the next 2 days.

## 2022-07-13 ENCOUNTER — Other Ambulatory Visit: Payer: Self-pay

## 2022-07-13 ENCOUNTER — Emergency Department (HOSPITAL_COMMUNITY)
Admission: EM | Admit: 2022-07-13 | Discharge: 2022-07-13 | Disposition: A | Discharge status: Home / Self Care | Payer: BLUE CROSS/BLUE SHIELD | Attending: Emergency Medicine | Admitting: Emergency Medicine

## 2022-07-13 ENCOUNTER — Emergency Department (HOSPITAL_COMMUNITY): Payer: BLUE CROSS/BLUE SHIELD

## 2022-07-13 ENCOUNTER — Encounter (HOSPITAL_COMMUNITY): Payer: Self-pay

## 2022-07-13 DIAGNOSIS — Z794 Long term (current) use of insulin: Secondary | ICD-10-CM | POA: Diagnosis not present

## 2022-07-13 DIAGNOSIS — S92414A Nondisplaced fracture of proximal phalanx of right great toe, initial encounter for closed fracture: Secondary | ICD-10-CM | POA: Insufficient documentation

## 2022-07-13 DIAGNOSIS — W208XXA Other cause of strike by thrown, projected or falling object, initial encounter: Secondary | ICD-10-CM | POA: Diagnosis not present

## 2022-07-13 DIAGNOSIS — S99921A Unspecified injury of right foot, initial encounter: Secondary | ICD-10-CM | POA: Diagnosis present

## 2022-07-13 MED ORDER — OXYCODONE HCL 5 MG PO TABS: 5.0000 mg | ORAL_TABLET | ORAL | 0 refills | Status: DC | PRN

## 2022-07-13 MED ORDER — ACETAMINOPHEN 500 MG PO TABS: 500.0000 mg | ORAL_TABLET | Freq: Four times a day (QID) | ORAL | 0 refills | Status: DC | PRN

## 2022-07-13 MED ORDER — HYDROCODONE-ACETAMINOPHEN 5-325 MG PO TABS
2.0000 | ORAL_TABLET | Freq: Once | ORAL | Status: AC
  Administered 2022-07-13: 2 via ORAL
  Filled 2022-07-13: qty 2

## 2022-07-13 MED ORDER — IBUPROFEN 600 MG PO TABS: 600.0000 mg | ORAL_TABLET | Freq: Four times a day (QID) | ORAL | 0 refills | Status: DC | PRN

## 2022-07-13 NOTE — ED Provider Triage Note (Signed)
Emergency Medicine Provider Triage Evaluation Note  [name] , a 37 y.o. male  was evaluated in triage.  Pt complains of concerns for right foot pain.  Notes he has a fracture to the right great toe on 07/08/22. Pt was given information for ortho, however, hasn't followed up yet.  He notes that he was given a postop shoe.  Notes that he may have accidentally struck his foot while in the past that she will try to get into the car.  Denies drainage.  Notes continued swelling to the area.  No fever.  Has tried Epson soaks at home.   Review of Systems  Positive:  Negative:   Physical Exam  BP (!) 161/95 (BP Location: Right Arm)   Pulse 100   Temp 98.3 F (36.8 C) (Oral)   Resp 18   SpO2 97%  Gen:   Awake, no distress   Resp:  Normal effort  MSK:   Moves extremities without difficulty  Other:  No TTP noted to RLE. TTP noted to right great toe with edema noted to the area. No increased warmth noted. Pedal pulses intact.   Medical Decision Making  Medically screening exam initiated at 1:41 PM.  Appropriate orders placed.  Aydin Danirus Gargus was informed that the remainder of the evaluation will be completed by another provider, this initial triage assessment does not replace that evaluation, and the importance of remaining in the ED until their evaluation is complete.  Work-up initiated.    Aemilia Dedrick A, PA-C 07/13/22 1343

## 2022-07-13 NOTE — ED Triage Notes (Signed)
Pt reports he fractured his right great toe on the 10th. States his pain and swelling gotten worse since. Pt is in a post op shoe, no new injury.

## 2022-07-13 NOTE — ED Provider Notes (Signed)
[place] EMERGENCY DEPARTMENT AT [facility] Provider Note   CSN: [id number] Arrival date & time: 07/13/22  1258     History  Chief Complaint  Patient presents with   Foot Pain    [name] is a 37 y.o. male presenting today with a right foot injury.  He reports 5 days ago he dropped a weight on his right toe and subsequently stubbed the toe.  He was seen at urgent care and they did an x-ray which was revealing of a fracture.  They put him in a postop shoe, given Vicodin and told him to follow-up with podiatry.  He has been unable to obtain a follow-up appointment for unknown reasons.  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  Has only tried Vicodin, no NSAIDs.   Foot Pain       Home Medications Prior to Admission medications   Medication Sig Start Date End Date Taking? Authorizing Provider  blood glucose meter kit and supplies Dispense based on patient and insurance preference. Use up to four times daily as directed: For blood sugar monitoring 10/08/20   Armandina Stammer I, NP  elvitegravir-cobicistat-emtricitabine-tenofovir (GENVOYA) 150-150-200-10 MG TABS tablet Take 1 tablet by mouth daily with breakfast. 02/03/22   Kathlynn Grate, DO  glucose blood (TRUE METRIX BLOOD GLUCOSE TEST) test strip use up to 4 times daily as directed for blood sugar monitoring 11/18/21   Storm Frisk, MD  hydrALAZINE (APRESOLINE) 25 MG tablet Take 1 tablet (25 mg total) by mouth every 8 (eight) hours. For hypertension 02/10/22   Storm Frisk, MD  HYDROcodone-acetaminophen (NORCO/VICODIN) 5-325 MG tablet Take 1 tablet by mouth every 6 (six) hours as needed (pain). 07/08/22   Zenia Resides, MD  insulin glargine-yfgn (SEMGLEE) 100 UNIT/ML Pen Inject 14 Units into the skin daily. 06/28/22   Morene Crocker, MD  insulin lispro (HUMALOG) 100 UNIT/ML KwikPen Inject 1-6 Units  into the skin as directed. ONLY with food - 1 unit per 15 grams of carbohydrates (bread, pasta, fruit, etc) 06/28/22   Morene Crocker, MD  insulin aspart (NOVOLOG) 100 UNIT/ML FlexPen Inject 1-6 Units into the skin as directed ONLY with food- 1 unit per 15 grams of carbohydrates (bread, pasta, fruit, etc) 06/28/22   Morene Crocker, MD  Insulin Pen Needle 31G X 5 MM MISC Use with insulin pen 02/10/22   Storm Frisk, MD  sertraline (ZOLOFT) 100 MG tablet Take 1.5 tablets (150 mg total) by mouth daily. 06/29/22 10/27/22  Morene Crocker, MD  valsartan-hydrochlorothiazide (DIOVAN-HCT) 320-25 MG tablet Take 1 tablet by mouth daily. 02/10/22   Storm Frisk, MD  Insulin Glargine [facility] KWIKPEN) 100 UNIT/ML Inject 14 Units into the skin daily. 06/28/22 06/28/22  Morene Crocker, MD  pravastatin (PRAVACHOL) 20 MG tablet Take 1 tablet (20 mg total) by mouth daily. Patient not taking: Reported on 03/31/2019 03/08/19 07/08/19  Claiborne Rigg, NP      Allergies    Shellfish allergy and Aspirin    Review of Systems   Review of Systems  Physical Exam Updated Vital Signs BP (!) 161/95 (BP Location: Right Arm)   Pulse 100   Temp 98.3 F (36.8 C) (Oral)   Resp 18   SpO2 97%  Physical Exam Vitals and nursing note reviewed.  Constitutional:      Appearance: Normal appearance.  HENT:     Head: Normocephalic and atraumatic.  Eyes:     General: No scleral icterus.    Conjunctiva/sclera: Conjunctivae normal.  Pulmonary:     Effort: Pulmonary effort is normal. No respiratory distress.  Musculoskeletal:     Comments: Full range of motion of the ankle and all MTPs.  Noticeable swelling at the first MTP.  Normal cap refill, normal DP pulse, no deformities  Skin:    Findings: No rash.  Neurological:     Mental Status: He is alert.  Psychiatric:        Mood and Affect: Mood normal.     ED Results / Procedures / Treatments   Labs (all labs ordered are listed, but  only abnormal results are displayed) Labs Reviewed - No data to display  EKG None  Radiology DG Foot Complete Right  Result Date: 07/13/2022 CLINICAL DATA:  Pain and swelling.  Fracture EXAM: RIGHT FOOT COMPLETE - 3 VIEW COMPARISON:  X-ray 07/08/2022. FINDINGS: Comminuted fracture again seen with slight displacement involving the proximal phalanx of the great toe with fracture lines extending back to the first metatarsophalangeal joint. No additional acute fracture or dislocation. Preserved joint spaces and bone mineralization. Fixation hardware seen of the tibia at the edge of the imaging field. Please correlate with the history. IMPRESSION: Comminuted fracture of the proximal phalanx of the great toe with stable alignment. Again fracture line involves the first metatarsophalangeal joint. Electronically Signed   By: Karen Kays M.D.   On: 07/13/2022 14:10    Procedures Procedures   Medications Ordered in ED Medications  HYDROcodone-acetaminophen (NORCO/VICODIN) 5-325 MG per tablet 2 tablet (2 tablets Oral Given 07/13/22 1505)    ED Course/ Medical Decision Making/ A&P                             Medical Decision Making Risk Prescription drug management.   37 year old male presenting today due to toe injury.  Was seen 5 days ago with x-rays showing toe x-ray.  X-ray was repeated from triage today and is along the same.  He is here for pain control and concern that he cannot return to work.  Neurovascularly intact.  Full range of motion of the first MTP.  No lacerations, abrasions or deformities.  Treatment: Vicodin for pain   Dispo: 37 year old male presenting today with toe pain.  He has a known fracture.  He is neurovascularly intact and will ultimately need to follow-up with podiatry.  He was given another referral and instructions to call.  I will give him a few tablets of oxycodone and acamprosate as well as a work note.  He is thankful for his care and will be  discharged  final Clinical Impression(s) / ED Diagnoses Final diagnoses:  Closed nondisplaced fracture of proximal phalanx of right great toe, initial encounter    Rx / DC Orders ED Discharge Orders     None      Results and diagnoses were explained to the patient. Return precautions discussed in full. Patient had no additional questions and expressed complete understanding.   This chart was dictated using voice recognition software.  Despite best efforts to proofread,  errors can occur which can change the documentation meaning.    Saddie Benders, PA-C 07/13/22 1527    Charlynne Pander, MD 07/13/22 [phone]

## 2022-07-13 NOTE — Discharge Instructions (Addendum)
You came to the emergency department today with a fracture in your toe.  Your x-ray shows the same thing.  You have been given a referral to podiatry.  You must call Dr. Annamary Rummage to set up an appointment either this afternoon or tomorrow morning.  You should not take the hydrocodone with the oxycodone.  Do not drive, drink alcohol or operate machinery on any of these medications.  Please take the ibuprofen and Tylenol as well for pain and swelling.  Do not hesitate to return with any worsening symptoms

## 2022-07-13 NOTE — Progress Notes (Signed)
Orthopedic Tech Progress Note Patient Details:  [name] 1985/05/27 [id number]  Ortho Devices Type of Ortho Device: Buddy tape, CAM walker Ortho Device/Splint Interventions: Ordered, Application, Adjustment, Removal   Post Interventions Patient Tolerated: Well Instructions Provided: Adjustment of device, Care of device  [name] OTR/L 07/13/2022, 3:48 PM

## 2022-07-14 ENCOUNTER — Inpatient Hospital Stay: Payer: BLUE CROSS/BLUE SHIELD | Admitting: Physician Assistant

## 2022-07-14 DIAGNOSIS — E1021 Type 1 diabetes mellitus with diabetic nephropathy: Secondary | ICD-10-CM

## 2022-07-21 ENCOUNTER — Ambulatory Visit: Payer: BLUE CROSS/BLUE SHIELD

## 2022-07-21 ENCOUNTER — Ambulatory Visit (INDEPENDENT_AMBULATORY_CARE_PROVIDER_SITE_OTHER): Payer: BLUE CROSS/BLUE SHIELD

## 2022-07-21 ENCOUNTER — Other Ambulatory Visit: Payer: Self-pay

## 2022-07-21 ENCOUNTER — Ambulatory Visit (INDEPENDENT_AMBULATORY_CARE_PROVIDER_SITE_OTHER): Payer: BLUE CROSS/BLUE SHIELD | Admitting: Podiatry

## 2022-07-21 ENCOUNTER — Encounter: Payer: Self-pay | Admitting: Podiatry

## 2022-07-21 DIAGNOSIS — M79671 Pain in right foot: Secondary | ICD-10-CM | POA: Diagnosis not present

## 2022-07-21 DIAGNOSIS — S92411A Displaced fracture of proximal phalanx of right great toe, initial encounter for closed fracture: Secondary | ICD-10-CM

## 2022-07-21 NOTE — Progress Notes (Signed)
Subjective:   Patient ID: [name], male   DOB: 37 y.o.   MRN: [id number]   HPI Patient states he dropped a weight on his right big toe 2 weeks ago and is remaining very swollen and tender and hard to walk on.  Patient smokes cigars occasionally and tries to be active   Review of Systems  All other systems reviewed and are negative.       Objective:  Physical Exam Vitals and nursing note reviewed.  Constitutional:      Appearance: He is well-developed.  Pulmonary:     Effort: Pulmonary effort is normal.  Musculoskeletal:        General: Normal range of motion.  Skin:    General: Skin is warm.  Neurological:     Mental Status: He is alert.     Neurovascular status was found to be intact muscle strength was found to be adequate range of motion adequate with quite a bit of swelling of the right hallux painful when pressed with no other edema erythema noted.  Diabetes under good control is HIV positive     Assessment:  Fracture of the right big toe proximal phalanx secondary to injury 2 weeks ago H&P     Plan:  Reviewed at great length he does have a boot that I want him to wear to immobilize and I did discuss it will probably take around 12 weeks to heal we did discuss fixation with screws but I think it will heal in its present position uneventfully but will take time.  All questions answered  X-rays indicate that there is a fracture of the proximal phalanx right with probability for some displacement but overall relatively stable

## 2022-07-29 ENCOUNTER — Other Ambulatory Visit: Payer: Self-pay | Admitting: Podiatry

## 2022-07-29 DIAGNOSIS — S92411A Displaced fracture of proximal phalanx of right great toe, initial encounter for closed fracture: Secondary | ICD-10-CM

## 2022-07-29 DIAGNOSIS — M79671 Pain in right foot: Secondary | ICD-10-CM

## 2022-08-02 NOTE — Congregational Nurse Program (Signed)
Dept: [phone]   Congregational Nurse Program Note  Date of Encounter: 08/02/2022  Past Medical History: Past Medical History:  Diagnosis Date   Anxiety    Asthma    Closed displaced comminuted fracture of shaft of right tibia 11/01/2017   Closed fracture of base of fifth metatarsal bone of right foot 11/13/2017   Closed fracture of medial malleolus of right ankle 10/31/2017   Depression    Headache    "only w/stress" (11/01/2017)   HIV (human immunodeficiency virus infection) (HCC)    Migraine    "a few/month" (11/01/2017)   Refusal of blood transfusions as patient is Jehovah's Witness    Seizure (HCC)    "only w/my low blood sugars"  (11/01/2017)   Type I diabetes mellitus (HCC)     Encounter Details:  CNP Questionnaire - 08/02/22 1120       Questionnaire   Ask client: Do you give verbal consent for me to treat you today? Yes    Student Assistance N/A    Location Patient Served  [facility]    Visit Setting with Client Organization    Patient Status Unhoused    Insurance Uninsured ([employer]/Care Connects/Self-Pay/Medicaid Family Planning)    Insurance/Financial Assistance Referral Medicaid    Medication Have Medication Insecurities    Medical Provider Yes    Screening Referrals Made N/A    Medical Referrals Made [facility]    Medical Appointment Made N/A    Recently w/o PCP, now 1st time PCP visit completed due to CNs referral or appointment made N/A    Food Have Food Insecurities    Transportation Need transportation assistance    Housing/Utilities No permanent housing    Interpersonal Safety Do not feel safe at current residence    Interventions Counsel;Advocate/Support;Reviewed Medications;Educate    Abnormal to Normal Screening Since Last CN Visit N/A    Screenings CN Performed Blood Pressure;Pulse Ox    Sent Client to Lab for: N/A    Did client attend any of the following based off CNs referral or appointments made? N/A    ED Visit Averted N/A     Life-Saving Intervention Made N/A               Dept: [phone]   Congregational Nurse Program Note  Date of Encounter: 08/02/2022  Clinic visit to check blood pressure, BP 146/97, pulse 100, O2 Sat 96%.  Reviewed medications, he stated he took everything as prescribed. Verified that he has clinic visit on tomorrow with infectious disease, counseled resident to alert clinic of elevated blood pressure. Past Medical History: Past Medical History:  Diagnosis Date   Anxiety    Asthma    Closed displaced comminuted fracture of shaft of right tibia 11/01/2017   Closed fracture of base of fifth metatarsal bone of right foot 11/13/2017   Closed fracture of medial malleolus of right ankle 10/31/2017   Depression    Headache    "only w/stress" (11/01/2017)   HIV (human immunodeficiency virus infection) (HCC)    Migraine    "a few/month" (11/01/2017)   Refusal of blood transfusions as patient is Jehovah's Witness    Seizure (HCC)    "only w/my low blood sugars"  (11/01/2017)   Type I diabetes mellitus (HCC)     Encounter Details:  CNP Questionnaire - 08/02/22 1120       Questionnaire   Ask client: Do you give verbal consent for me to treat you today? Yes    Student Assistance N/A  Location Patient Served  [facility]    Visit Setting with Client Organization    Patient Status Unhoused    Insurance Uninsured ([employer]/Care Connects/Self-Pay/Medicaid Family Planning)    Insurance/Financial Assistance Referral Medicaid    Medication Have Medication Insecurities    Medical Provider Yes    Screening Referrals Made N/A    Medical Referrals Made [facility]CP/[facility]    Medical Appointment Made N/A    Recently w/o PCP, now 1st time PCP visit completed due to CNs referral or appointment made N/A    Food Have Food Insecurities    Transportation Need transportation assistance    Housing/Utilities No permanent housing    Interpersonal Safety Do not feel safe at current residence     Interventions Counsel;Advocate/Support;Reviewed Medications    Abnormal to Normal Screening Since Last CN Visit N/A    Screenings CN Performed Blood Pressure;Pulse Ox    Sent Client to Lab for: N/A    Did client attend any of the following based off CNs referral or appointments made? N/A    ED Visit Averted N/A    Life-Saving Intervention Made N/A

## 2022-08-03 ENCOUNTER — Ambulatory Visit (INDEPENDENT_AMBULATORY_CARE_PROVIDER_SITE_OTHER): Payer: BLUE CROSS/BLUE SHIELD | Admitting: Family

## 2022-08-03 ENCOUNTER — Encounter: Payer: Self-pay | Admitting: Family

## 2022-08-03 ENCOUNTER — Ambulatory Visit: Payer: BLUE CROSS/BLUE SHIELD | Admitting: Internal Medicine

## 2022-08-03 ENCOUNTER — Other Ambulatory Visit: Payer: Self-pay

## 2022-08-03 ENCOUNTER — Other Ambulatory Visit (HOSPITAL_COMMUNITY): Payer: Self-pay

## 2022-08-03 VITALS — BP 146/84 | HR 85 | Temp 98.7°F | Resp 16 | Wt 190.0 lb

## 2022-08-03 DIAGNOSIS — E1021 Type 1 diabetes mellitus with diabetic nephropathy: Secondary | ICD-10-CM | POA: Diagnosis not present

## 2022-08-03 DIAGNOSIS — B20 Human immunodeficiency virus [HIV] disease: Secondary | ICD-10-CM

## 2022-08-03 DIAGNOSIS — A539 Syphilis, unspecified: Secondary | ICD-10-CM | POA: Diagnosis not present

## 2022-08-03 DIAGNOSIS — Z79899 Other long term (current) drug therapy: Secondary | ICD-10-CM

## 2022-08-03 DIAGNOSIS — Z0000 Encounter for general adult medical examination without abnormal findings: Secondary | ICD-10-CM

## 2022-08-03 DIAGNOSIS — F332 Major depressive disorder, recurrent severe without psychotic features: Secondary | ICD-10-CM

## 2022-08-03 DIAGNOSIS — Z21 Asymptomatic human immunodeficiency virus [HIV] infection status: Secondary | ICD-10-CM

## 2022-08-03 MED ORDER — INSULIN GLARGINE 100 UNIT/ML SOLOSTAR PEN
14.0000 [IU] | PEN_INJECTOR | Freq: Every day | SUBCUTANEOUS | 2 refills | Status: DC
  Filled 2022-08-03: qty 6, 42d supply, fill #0

## 2022-08-03 MED ORDER — GENVOYA 150-150-200-10 MG PO TABS
1.0000 | ORAL_TABLET | Freq: Every day | ORAL | 5 refills | Status: DC
Start: 2022-08-03 — End: 2022-08-16

## 2022-08-03 MED ORDER — INSULIN LISPRO (1 UNIT DIAL) 100 UNIT/ML (KWIKPEN)
1.0000 [IU] | PEN_INJECTOR | SUBCUTANEOUS | 11 refills | Status: DC
  Filled 2022-08-03: qty 15, 84d supply, fill #0

## 2022-08-03 NOTE — Patient Instructions (Addendum)
Nice to see you.  We will check your lab work today.  Continue to take your medication daily as prescribed.  Refills have been sent to the pharmacy.  Plan for follow up in 1 week or sooner if needed   [facility] Address: [address] [place], [place] [address] Hours:  Open 24 hours Phone: [phone]

## 2022-08-03 NOTE — Assessment & Plan Note (Signed)
[name] continues to have well controlled virus with good adherence and tolerance to Genvoya.  Reviewed previous lab work and discussed plan of care and U equals U.  Check lab work today.  Continue current dose of Genvoya.  Plan for follow-up in 1 week or sooner if needed.

## 2022-08-03 NOTE — Assessment & Plan Note (Signed)
Mr. Heuton continues to have depression likely secondary to underlying stressors including managing of multiple medical conditions and social/economical factors. Currently homeless and in a shelter and not working. Denies suicidal ideations and does not wish to pursue counseling at this time. He is on sertraline. Resources provided should symptoms worsen. Connected with [facility] to aid in community resources and food provided. Will plan to follow up in 1 month or sooner if needed.

## 2022-08-03 NOTE — Assessment & Plan Note (Signed)
Discussed importance of safe sexual practice and condom use. Condoms and STD testing offered.  Will discuss vaccinations and screenings at next visit.

## 2022-08-03 NOTE — Progress Notes (Signed)
Brief Narrative   Patient ID: [name], male    DOB: 1985-04-21, 37 y.o.   MRN: [id number]  [name] is a 38 y/o male diagnosed with HIV disease in July 2016 with risk factor of MSM. Initial viral load of 8,832 and CD4 count 330. Genotype with K103N (Efavirenz, Nevirapine). No history of opportunistic infection. Entered care at [facility] Stage 2. ART experienced with Genovya.   Subjective:    Chief Complaint  Patient presents with   Follow-up    B20     HPI:  [name] is a 37 y.o. male with HIV disease last seen by Dr. Earlene Plater on 02/03/22 with well controlled virus and good adherence and tolerance to Genvoya. Continued to have longstanding issue with depression and management with Type 1 diabetes. Here today for follow up.  [name] has not been doing well since his last office visit and continues to feel down about his current situation and events of the past. Continues to take [facility] as prescribed with no adverse side effects or problems obtaining medication from the pharmacy. Now covered by Medicaid. Currently homeless and has been at [facility]. Someone misplaced his insulin doses. Has growing fatigue and frustration with trying to get himself established. Has had suicidal ideations previously but none currently. Currently taking Zolfot and not interested in pursuing counseling.Condoms and STD testing offered. Not currently working.   Denies fevers, chills, night sweats, headaches, changes in vision, neck pain/stiffness, nausea, diarrhea, vomiting, lesions or rashes.   Allergies  Allergen Reactions   Shellfish Allergy Anaphylaxis and Hives   Aspirin Other (See Comments)    Lump in armpit      Outpatient Medications Prior to Visit  Medication Sig Dispense Refill   acetaminophen (TYLENOL) 500 MG tablet Take 1 tablet (500 mg total) by mouth every 6 (six) hours as needed. 30 tablet 0   blood glucose meter kit and supplies Dispense based  on patient and insurance preference. Use up to four times daily as directed: For blood sugar monitoring 1 each 0   glucose blood (TRUE METRIX BLOOD GLUCOSE TEST) test strip use up to 4 times daily as directed for blood sugar monitoring 100 each 2   hydrALAZINE (APRESOLINE) 25 MG tablet Take 1 tablet (25 mg total) by mouth every 8 (eight) hours. For hypertension 90 tablet 2   HYDROcodone-acetaminophen (NORCO/VICODIN) 5-325 MG tablet Take 1 tablet by mouth every 6 (six) hours as needed (pain). 12 tablet 0   ibuprofen (ADVIL) 600 MG tablet Take 1 tablet (600 mg total) by mouth every 6 (six) hours as needed. 30 tablet 0   Insulin Pen Needle 31G X 5 MM MISC Use with insulin pen 100 each 1   oxyCODONE (ROXICODONE) 5 MG immediate release tablet Take 1 tablet (5 mg total) by mouth every 4 (four) hours as needed for severe pain. 12 tablet 0   sertraline (ZOLOFT) 100 MG tablet Take 1.5 tablets (150 mg total) by mouth daily. 45 tablet 3   valsartan-hydrochlorothiazide (DIOVAN-HCT) 320-25 MG tablet Take 1 tablet by mouth daily. 90 tablet 3   elvitegravir-cobicistat-emtricitabine-tenofovir (GENVOYA) 150-150-200-10 MG TABS tablet Take 1 tablet by mouth daily with breakfast. 30 tablet 5   insulin aspart (NOVOLOG) 100 UNIT/ML FlexPen Inject 1-6 Units into the skin as directed ONLY with food- 1 unit per 15 grams of carbohydrates (bread, pasta, fruit, etc) 3 mL 11   insulin glargine-yfgn (SEMGLEE) 100 UNIT/ML Pen Inject 14 Units into the skin daily.  3 mL 1   insulin lispro (HUMALOG) 100 UNIT/ML KwikPen Inject 1-6 Units into the skin as directed. ONLY with food - 1 unit per 15 grams of carbohydrates (bread, pasta, fruit, etc) 15 mL 11   No facility-administered medications prior to visit.     Past Medical History:  Diagnosis Date   Anxiety    Asthma    Closed displaced comminuted fracture of shaft of right tibia 11/01/2017   Closed fracture of base of fifth metatarsal bone of right foot 11/13/2017   Closed  fracture of medial malleolus of right ankle 10/31/2017   Depression    Headache    "only w/stress" (11/01/2017)   HIV (human immunodeficiency virus infection) (HCC)    Migraine    "a few/month" (11/01/2017)   Refusal of blood transfusions as patient is Jehovah's Witness    Seizure (HCC)    "only w/my low blood sugars"  (11/01/2017)   Type I diabetes mellitus (HCC)      Past Surgical History:  Procedure Laterality Date   FRACTURE SURGERY     IM NAILING TIBIA Right 11/01/2017   INTRAMEDULLARY (IM) NAIL TIBIALRightGeneral   ORIF ANKLE FRACTURE Right 11/01/2017   ORIF ANKLE FRACTURE Right 11/01/2017   Procedure: OPEN REDUCTION INTERNAL FIXATION (ORIF) ANKLE FRACTURE;  Surgeon: Roby Lofts, MD;  Location: [facility] OR;  Service: Orthopedics;  Laterality: Right;   TIBIA IM NAIL INSERTION Right 11/01/2017   Procedure: INTRAMEDULLARY (IM) NAIL TIBIAL;  Surgeon: Roby Lofts, MD;  Location: [facility] OR;  Service: Orthopedics;  Laterality: Right;      Review of Systems  Constitutional:  Negative for appetite change, chills, fatigue, fever and unexpected weight change.  Eyes:  Negative for visual disturbance.  Respiratory:  Negative for cough, chest tightness, shortness of breath and wheezing.   Cardiovascular:  Negative for chest pain and leg swelling.  Gastrointestinal:  Negative for abdominal pain, constipation, diarrhea, nausea and vomiting.  Genitourinary:  Negative for dysuria, flank pain, frequency, genital sores, hematuria and urgency.  Skin:  Negative for rash.  Allergic/Immunologic: Negative for immunocompromised state.  Neurological:  Negative for dizziness and headaches.  Psychiatric/Behavioral:  Positive for dysphoric mood. Negative for hallucinations, self-injury, sleep disturbance and suicidal ideas. The patient is not hyperactive.       Objective:    BP (!) 146/84   Pulse 85   Temp 98.7 F (37.1 C) (Oral)   Resp 16   Wt 190 lb (86.2 kg)   SpO2 100%   BMI 25.77 kg/m  Nursing  note and vital signs reviewed.  Physical Exam Constitutional:      General: He is not in acute distress.    Appearance: He is well-developed.  Eyes:     Conjunctiva/sclera: Conjunctivae normal.  Cardiovascular:     Rate and Rhythm: Normal rate and regular rhythm.     Heart sounds: Normal heart sounds. No murmur heard.    No friction rub. No gallop.  Pulmonary:     Effort: Pulmonary effort is normal. No respiratory distress.     Breath sounds: Normal breath sounds. No wheezing or rales.  Chest:     Chest wall: No tenderness.  Abdominal:     General: Bowel sounds are normal.     Palpations: Abdomen is soft.     Tenderness: There is no abdominal tenderness.  Musculoskeletal:     Cervical back: Neck supple.  Lymphadenopathy:     Cervical: No cervical adenopathy.  Skin:    General: Skin is  warm and dry.     Findings: No rash.  Neurological:     Mental Status: He is alert and oriented to person, place, and time.  Psychiatric:        Mood and Affect: Mood is depressed.        Behavior: Behavior normal.        Thought Content: Thought content normal.        Judgment: Judgment normal.        08/03/2022   10:35 AM 02/10/2022   11:16 AM 10/25/2021   12:21 PM 08/03/2021    1:38 PM 10/30/2020    2:07 PM  Depression screen PHQ 2/9  Decreased Interest 0 3 0 1 3  Down, Depressed, Hopeless 0 3 3 2 1   PHQ - 2 Score 0 6 3 3 4   Altered sleeping  3 3 3 3   Tired, decreased energy  3 3 3 3   Change in appetite  1 3 0 3  Feeling bad or failure about yourself   3 3 0 3  Trouble concentrating  2 3 2 3   Moving slowly or fidgety/restless  1  0 1  Suicidal thoughts  3 3 3 3   PHQ-9 Score  22 21 14 23   Difficult doing work/chores    Extremely dIfficult Very difficult       Assessment & Plan:    Patient Active Problem List   Diagnosis Date Noted   Hypoglycemia due to insulin 06/27/2022   Hypoglycemia 03/21/2022   Hypocalcemia 03/21/2022   Routine screening for STI (sexually transmitted  infection) 02/03/2022   Essential hypertension 09/28/2020   History of suicidal ideation 02/09/2020   MDD (major depressive disorder), recurrent episode, severe (HCC) 01/15/2020   Healthcare maintenance 12/05/2019   Syphilis 01/15/2019   Encounter for long-term (current) use of medications 06/09/2015   HIV infection (HCC) 11/11/2014   Stress 12/19/2012   Asthma 10/02/2010   Diabetes mellitus type 1 (HCC) 03/01/1991     Problem List Items Addressed This Visit       Endocrine   Diabetes mellitus type 1 (HCC) (Chronic)    Staying at [facility] and not able to get insulin replaced that was possibly stolen or misplaced. Will change to Humalog and Lantus pens to continue treatment in the interim. Encouraged to follow up with Internal Medicine as well.       Relevant Medications   insulin lispro (HUMALOG) 100 UNIT/ML KwikPen   insulin glargine (LANTUS) 100 UNIT/ML Solostar Pen     Other   HIV infection [facility])    [name] continues to have well controlled virus with good adherence and tolerance to Genvoya.  Reviewed previous lab work and discussed plan of care and U equals U.  Check lab work today.  Continue current dose of Genvoya.  Plan for follow-up in 1 week or sooner if needed.      Relevant Medications   elvitegravir-cobicistat-emtricitabine-tenofovir (GENVOYA) 150-150-200-10 MG TABS tablet   Other Relevant Orders   BASIC METABOLIC PANEL WITH GFR   HIV-1 RNA quant-no reflex-bld   T-helper cell (CD4)- ([facility] clinic only)   Syphilis   Relevant Medications   elvitegravir-cobicistat-emtricitabine-tenofovir (GENVOYA) 150-150-200-10 MG TABS tablet   Other Relevant Orders   RPR   Healthcare maintenance    Discussed importance of safe sexual practice and condom use. Condoms and STD testing offered.  Will discuss vaccinations and screenings at next visit.       MDD (major depressive disorder), recurrent episode, severe (  [facility]) - Primary    [name] continues to  have depression likely secondary to underlying stressors including managing of multiple medical conditions and social/economical factors. Currently homeless and in a shelter and not working. Denies suicidal ideations and does not wish to pursue counseling at this time. He is on sertraline. Resources provided should symptoms worsen. Connected with [facility] to aid in community resources and food provided. Will plan to follow up in 1 month or sooner if needed.       Other Visit Diagnoses     Pharmacologic therapy            I have discontinued Juan D. Solorio "Shaun"'s insulin glargine-yfgn and insulin aspart. I am also having him start on insulin glargine. Additionally, I am having him maintain his blood glucose meter kit and supplies, True Metrix Blood Glucose Test, Insulin Pen Needle, valsartan-hydrochlorothiazide, hydrALAZINE, sertraline, HYDROcodone-acetaminophen, oxyCODONE, ibuprofen, acetaminophen, Genvoya, and insulin lispro.   Meds ordered this encounter  Medications   elvitegravir-cobicistat-emtricitabine-tenofovir (GENVOYA) 150-150-200-10 MG TABS tablet    Sig: Take 1 tablet by mouth daily with breakfast.    Dispense:  30 tablet    Refill:  5    Order Specific Question:   Supervising Provider    Answer:   Drue Second, CYNTHIA [[id number]]   insulin lispro (HUMALOG) 100 UNIT/ML KwikPen    Sig: Inject 1-6 Units into the skin as directed. ONLY with food - 1 unit per 15 grams of carbohydrates (bread, pasta, fruit, etc)    Dispense:  15 mL    Refill:  11    Order Specific Question:   Supervising Provider    Answer:   Drue Second, CYNTHIA [[id number]]   insulin glargine (LANTUS) 100 UNIT/ML Solostar Pen    Sig: Inject 14 Units into the skin daily.    Dispense:  15 mL    Refill:  2    Order Specific Question:   Supervising Provider    Answer:   Judyann Munson [[id number]]    I have personally spent 65 minutes involved in face-to-face and non-face-to-face activities for this patient on the  day of the visit. Professional time spent includes the following activities: Preparing to see the patient (review of tests), Obtaining and/or reviewing separately obtained history (admission/discharge record), Performing a medically appropriate examination and/or evaluation , Ordering medications/tests/procedures, referring and communicating with other health care professionals, Documenting clinical information in the EMR, Independently interpreting results (not separately reported), Communicating results to the patient/family/caregiver, Counseling and educating the patient/family/caregiver and Care coordination (not separately reported).    Follow-up: 1 week or sooner if needed.    Marcos Eke, MSN, FNP-C Nurse Practitioner [facility] Main number: [phone]

## 2022-08-03 NOTE — Assessment & Plan Note (Signed)
Staying at [facility] and not able to get insulin replaced that was possibly stolen or misplaced. Will change to Humalog and Lantus pens to continue treatment in the interim. Encouraged to follow up with Internal Medicine as well.

## 2022-08-04 LAB — BASIC METABOLIC PANEL WITH GFR
BUN: 23 mg/dL (ref 7–25)
CO2: 25 mmol/L (ref 20–32)
Chloride: 103 mmol/L (ref 98–110)

## 2022-08-04 LAB — T-HELPER CELL (CD4) - (RCID CLINIC ONLY)
CD4 % Helper T Cell: 39 % (ref 33–65)
CD4 T Cell Abs: 732 /uL (ref 400–1790)

## 2022-08-04 LAB — RPR: RPR Ser Ql: REACTIVE — AB

## 2022-08-06 LAB — T PALLIDUM AB: T Pallidum Abs: POSITIVE — AB

## 2022-08-06 LAB — BASIC METABOLIC PANEL WITH GFR
BUN/Creatinine Ratio: 15 (calc) (ref 6–22)
Calcium: 10 mg/dL (ref 8.6–10.3)
Creat: 1.51 mg/dL — ABNORMAL HIGH (ref 0.60–1.26)
Glucose, Bld: 282 mg/dL — ABNORMAL HIGH (ref 65–99)
Potassium: 4.6 mmol/L (ref 3.5–5.3)
Sodium: 138 mmol/L (ref 135–146)
eGFR: 61 mL/min/{1.73_m2} (ref >=60)

## 2022-08-06 LAB — HIV-1 RNA QUANT-NO REFLEX-BLD
HIV 1 RNA Quant: NOT DETECTED Copies/mL
HIV-1 RNA Quant, Log: NOT DETECTED Log cps/mL

## 2022-08-06 LAB — RPR TITER: RPR Titer: 1:32 {titer} — ABNORMAL HIGH

## 2022-08-08 ENCOUNTER — Telehealth: Payer: Self-pay

## 2022-08-08 NOTE — Telephone Encounter (Signed)
-----   Message from Veryl Speak, FNP sent at 08/08/2022 12:01 PM EDT ----- Please schedule Mr. Steinborn for 3 weekly injections of 2.4 million units Bicillin IM.

## 2022-08-08 NOTE — Telephone Encounter (Signed)
Attempted to call patient to review lab results. Not able to reach him at this time. Left voicemail requesting call back. Juanita Laster, RMA

## 2022-08-10 ENCOUNTER — Ambulatory Visit (INDEPENDENT_AMBULATORY_CARE_PROVIDER_SITE_OTHER): Payer: BLUE CROSS/BLUE SHIELD

## 2022-08-10 ENCOUNTER — Other Ambulatory Visit: Payer: Self-pay

## 2022-08-10 DIAGNOSIS — A539 Syphilis, unspecified: Secondary | ICD-10-CM | POA: Diagnosis not present

## 2022-08-10 MED ORDER — PENICILLIN G BENZATHINE 1200000 UNIT/2ML IM SUSY
1.2000 10*6.[IU] | PREFILLED_SYRINGE | Freq: Once | INTRAMUSCULAR | Status: AC
Start: 2022-08-10 — End: 2022-08-10
  Administered 2022-08-10: 1.2 10*6.[IU] via INTRAMUSCULAR

## 2022-08-10 NOTE — Telephone Encounter (Signed)
Second attempt to reach patient, no answer. Left HIPAA compliant voicemail requesting callback.   Chevette Fee D Anouk Critzer, RN  

## 2022-08-10 NOTE — Telephone Encounter (Signed)
Patient walked in for treatment.   Sandie Ano, RN

## 2022-08-11 ENCOUNTER — Ambulatory Visit: Payer: BLUE CROSS/BLUE SHIELD | Admitting: Family

## 2022-08-14 NOTE — Progress Notes (Deleted)
Established patient Office Visit  Subjective    Patient ID: [name], male    DOB: 01-26-1986  Age: 37 y.o. MRN: [id number]  CC:  No chief complaint on file.   HPI 10/18/21 Crit Danirus Slader presents to establish care This is a former patient in our clinic last seen in 2021 by Ms. Fleming for type 1 diabetes and diabetic polyneuropathy.  The patient unfortunately is now homeless but comes in today to reestablish.  On arrival blood sugar 222 A1c 8.1 blood pressure 175/103.  The patient has been homeless now for several months.  He does have access to 70/30 insulin which she uses an insulin syringe for.  He has no other medications.  He does suffer from severe mental health issues with depression he feels like he can go no further and has been having active suicidal ideation.  Apparently he has been seen for this problem in the past.  He was on citalopram in the past but is off this medication.  The patient needs a complete health screening at this visit  Patient does have numbness in the feet.  Patient also is followed by the HIV clinic below is documentation for the last visit in June.  He has been compliant with his HIV medications.  His labs in August 2022 showed improvement in his numbers. The biggest issue continues to be depression and ongoing chronic suicidal ideation.  See assessment below note the patient does drink 2 beers daily.  He does not smoke cigarettes nor does he use other substances.  12/14 Patient returns in follow-up for type I diet [name] severe depression and hypertension.  The patient has not been taking insulin regularly and did not take any this morning and did not eat today comes in with a blood sugar 378 A1c of 9.8  Patient also has had significant depression with chronic suicidal ideation but no clear plan.  At the last visit we try to get him over to [facility] behavioral health with the police escort but he left before the police  arrived.  There is no record of any emergency room visits for suicidal attempts since that visit in August.  Patient does stay chronically depressed did not fill the Celexa be prescribed.  Blood pressure currently is controlled on amlodipine valsartan HCT and hydralazine.  Blood pressure on arrival 122/71  08/16/22 Multiple ED and hosp visit since last seen: Date of Admission: 06/26/2022  3:09 AM Date of Discharge: 06/28/2022 11:43 AM Attending Physician: Dr. Oswaldo Done   Discharge Diagnosis: Principal Problem:   Hypoglycemia Active Problems:   Diabetes mellitus type 1 (HCC)   HIV infection (HCC)   MDD (major depressive disorder), recurrent episode, severe (HCC)   Hypoglycemia due to insulin  Hospital Course by problem list: [name] is a 36-yo with severe depression , HIV, type 1 diabetes admitted to IMTS service for management of symptomatic hypoglycemia    Type 1 Diabetes Symptomatic hypoglycemia Type 1 DM diagnosed during childhood with intermittent access to medical care. Prior to admission insulin regimen 22 units Lantus daily and 10 units of short acting BID with meals. This admission was patient's 4th visit for symptomatic hypoglycemia in the past 6 months. Etiology of hypoglycemia in the setting of administration of both long acting and short acting insulin with no or low oral intake. Patient did not check his blood sugars prior to self administering insulin. Mr. Esquibel was treated with D50 during episodes of hypoglycemia and with lower doses  of long acting insulin. He received diabetes education, a continuous glucose meter. Suspect patient's poor appetite in setting of severe depression versus gastroparesis. Today patient denied nausea or vomiting and no documented hypoglycemia in 24 hours. He was discharged with a lower dose, 14 units, of long acting insulin, as well as a short acting insulin with carbohydrate ratio of 1:15. Medications were sent to his preferred pharmacy  for coverage.   AKI on CKD Diabetic nephropathy Hematuria Patient with Cr 1.42 on admission, which peaked at 1.8. U/A with evidence of proteinuria and hematuria. No casts seen. CT abdomen on admission without signs of hydronephrosis, nephrolithiasis, or obstruction. Proteinuria seen in prior studies. New onset hematuria during this admission. Urine protein to creatinine ratio of 1. Given patient's history, likely in the setting of diabetic nephropathy. Suspect worsening kidney function in setting of poor po intake. Discharge Cr 1.42, improved. Attempted ARB re initiation at discharge; patient denied. Recommend follow up uranalysis to re-evaluate for hematuria.   Hypertension  Treated with amlodipine during this admission. Counseled patient on protective benefits of treating hypertension given his his DM and renal function. Recommend continued counsel and re-initiation of therapy at discharge   Severe MDD SDoH Patient with multiple socioeconomic stressors now undergoing grieving process for his grandmother. Has been unhomed in the past, now living with friends. He has been taking Sertraline 100 mg since 01/2022. Patient with labile mood during this admission having difficulty coping or managing his medical condition. Question patient's medical literacy as well. Increased Sertraline dosing to 150 mg daily and shared information about local organizations that receive walk-in/ urgent care services for psychotherapy support.    HIV Syphilis ollowed at ID clinic with Dr. Earlene Plater, last seen in December 2023. 02/03/2022 HIV RNA quant not detected, CD4 T cells 520. Last filled 05/10/2022. Of note, Patient Patient has had a history of syphilis, previously treated in the outpatient setting with Bicillin in September of 2022 after a 1:32 titer. His last titer after treatment in 01/2022 (1.3 years later) was 1:16. Per guidelines, a 4-fold change in titer is considered adequate therapeutic response, which in this  patient case would be 1:8. Ensure follow up with Dr. Earlene Plater in June 2024   Dental decay This patient would benefit of outpatient dental services   Discharge Subjective: Patient feeling physical improvement since arrival at the hospital. Denies nausea, vomiting, GI disturbances. Patient endorsing mild increase in appetite. Friend at beside with him. Patient  willing to meet with diabetes educator, understanding new insulin dosing regimen, and open to following up at [facility] after discharg   Outpatient Encounter Medications as of 08/16/2022  Medication Sig   acetaminophen (TYLENOL) 500 MG tablet Take 1 tablet (500 mg total) by mouth every 6 (six) hours as needed.   blood glucose meter kit and supplies Dispense based on patient and insurance preference. Use up to four times daily as directed: For blood sugar monitoring   elvitegravir-cobicistat-emtricitabine-tenofovir (GENVOYA) 150-150-200-10 MG TABS tablet Take 1 tablet by mouth daily with breakfast.   glucose blood (TRUE METRIX BLOOD GLUCOSE TEST) test strip use up to 4 times daily as directed for blood sugar monitoring   hydrALAZINE (APRESOLINE) 25 MG tablet Take 1 tablet (25 mg total) by mouth every 8 (eight) hours. For hypertension   HYDROcodone-acetaminophen (NORCO/VICODIN) 5-325 MG tablet Take 1 tablet by mouth every 6 (six) hours as needed (pain).   ibuprofen (ADVIL) 600 MG tablet Take 1 tablet (600 mg total) by mouth every 6 (  six) hours as needed.   insulin glargine (LANTUS) 100 UNIT/ML Solostar Pen Inject 14 Units into the skin daily.   insulin lispro (HUMALOG) 100 UNIT/ML KwikPen Inject 1-6 Units into the skin as directed. ONLY with food - 1 unit per 15 grams of carbohydrates (bread, pasta, fruit, etc)   Insulin Pen Needle 31G X 5 MM MISC Use with insulin pen   oxyCODONE (ROXICODONE) 5 MG immediate release tablet Take 1 tablet (5 mg total) by mouth every 4 (four) hours as needed for severe pain.   sertraline  (ZOLOFT) 100 MG tablet Take 1.5 tablets (150 mg total) by mouth daily.   valsartan-hydrochlorothiazide (DIOVAN-HCT) 320-25 MG tablet Take 1 tablet by mouth daily.   [DISCONTINUED] pravastatin (PRAVACHOL) 20 MG tablet Take 1 tablet (20 mg total) by mouth daily. (Patient not taking: Reported on 03/31/2019)   No facility-administered encounter medications on file as of 08/16/2022.    Past Medical History:  Diagnosis Date   Anxiety    Asthma    Closed displaced comminuted fracture of shaft of right tibia 11/01/2017   Closed fracture of base of fifth metatarsal bone of right foot 11/13/2017   Closed fracture of medial malleolus of right ankle 10/31/2017   Depression    Headache    "only w/stress" (11/01/2017)   HIV (human immunodeficiency virus infection) (HCC)    Migraine    "a few/month" (11/01/2017)   Refusal of blood transfusions as patient is Jehovah's Witness    Seizure (HCC)    "only w/my low blood sugars"  (11/01/2017)   Type I diabetes mellitus (HCC)     Past Surgical History:  Procedure Laterality Date   FRACTURE SURGERY     IM NAILING TIBIA Right 11/01/2017   INTRAMEDULLARY (IM) NAIL TIBIALRightGeneral   ORIF ANKLE FRACTURE Right 11/01/2017   ORIF ANKLE FRACTURE Right 11/01/2017   Procedure: OPEN REDUCTION INTERNAL FIXATION (ORIF) ANKLE FRACTURE;  Surgeon: Roby Lofts, MD;  Location: [facility] OR;  Service: Orthopedics;  Laterality: Right;   TIBIA IM NAIL INSERTION Right 11/01/2017   Procedure: INTRAMEDULLARY (IM) NAIL TIBIAL;  Surgeon: Roby Lofts, MD;  Location: [facility] OR;  Service: Orthopedics;  Laterality: Right;    Family History  Problem Relation Age of Onset   Thyroid disease Mother    Diabetes Mother    Breast cancer Maternal Grandmother    Diabetes Maternal Grandmother     Social History   Socioeconomic History   Marital status: Single    Spouse name: Not on file   Number of children: Not on file   Years of education: Not on file   Highest education level: Not on  file  Occupational History   Not on file  Tobacco Use   Smoking status: Some Days    Types: Cigars    Start date: 09/29/2015   Smokeless tobacco: Never   Tobacco comments:    11/01/2017  "2 cigarettes in ~ 6 month "  Vaping Use   Vaping Use: Former   Substances: THC  Substance and Sexual Activity   Alcohol use: Not Currently    Alcohol/week: 7.0 standard drinks of alcohol    Types: 7 Cans of beer per week   Drug use: Yes    Frequency: 2.0 times per week    Types: Marijuana    Comment: 11/01/2017  "weekly"   Sexual activity: Yes    Partners: Male    Comment: pt. declined condoms  Other Topics Concern   Not on file  Social  History Narrative   Not on file   Social Determinants of Health   Financial Resource Strain: Not on file  Food Insecurity: No Food Insecurity (06/26/2022)   Hunger Vital Sign    Worried About Running Out of Food in the Last Year: Never true    Ran Out of Food in the Last Year: Never true  Transportation Needs: No Transportation Needs (06/26/2022)   PRAPARE - Administrator, Civil Service (Medical): No    Lack of Transportation (Non-Medical): No  Physical Activity: Not on file  Stress: Not on file  Social Connections: Not on file  Intimate Partner Violence: Not At Risk (06/26/2022)   Humiliation, Afraid, Rape, and Kick questionnaire    Fear of Current or Ex-Partner: No    Emotionally Abused: No    Physically Abused: No    Sexually Abused: No    Review of Systems  Constitutional:  Negative for chills, diaphoresis, fever, malaise/fatigue and weight loss.  HENT:  Negative for congestion, hearing loss, nosebleeds, sore throat and tinnitus.   Eyes:  Negative for blurred vision, photophobia and redness.  Respiratory:  Negative for cough, hemoptysis, sputum production, shortness of breath, wheezing and stridor.   Cardiovascular:  Negative for chest pain, palpitations, orthopnea, claudication, leg swelling and PND.  Gastrointestinal:  Negative for  abdominal pain, blood in stool, constipation, diarrhea, heartburn, nausea and vomiting.  Genitourinary:  Negative for dysuria, flank pain, frequency, hematuria and urgency.  Musculoskeletal:  Negative for back pain, falls, joint pain, myalgias and neck pain.  Skin:  Negative for itching and rash.  Neurological:  Positive for sensory change. Negative for dizziness, tingling, tremors, speech change, focal weakness, seizures, loss of consciousness, weakness and headaches.  Endo/Heme/Allergies:  Negative for environmental allergies and polydipsia. Does not bruise/bleed easily.  Psychiatric/Behavioral:  Positive for depression and suicidal ideas. Negative for memory loss and substance abuse. The patient is not nervous/anxious and does not have insomnia.         Objective    There were no vitals taken for this visit.  Physical Exam Vitals reviewed.  Constitutional:      Appearance: Normal appearance. He is well-developed. He is not diaphoretic.  HENT:     Head: Normocephalic and atraumatic.     Nose: No nasal deformity, septal deviation, mucosal edema or rhinorrhea.     Right Sinus: No maxillary sinus tenderness or frontal sinus tenderness.     Left Sinus: No maxillary sinus tenderness or frontal sinus tenderness.     Mouth/Throat:     Pharynx: No oropharyngeal exudate.  Eyes:     General: No scleral icterus.    Conjunctiva/sclera: Conjunctivae normal.     Pupils: Pupils are equal, round, and reactive to light.  Neck:     Thyroid: No thyromegaly.     Vascular: No carotid bruit or JVD.     Trachea: Trachea normal. No tracheal tenderness or tracheal deviation.  Cardiovascular:     Rate and Rhythm: Normal rate and regular rhythm.     Chest Wall: PMI is not displaced.     Pulses: Normal pulses. No decreased pulses.     Heart sounds: Normal heart sounds, S1 normal and S2 normal. Heart sounds not distant. No murmur heard.    No systolic murmur is present.     No diastolic murmur is  present.     No friction rub. No gallop. No S3 or S4 sounds.  Pulmonary:     Effort: No tachypnea, accessory muscle  usage or respiratory distress.     Breath sounds: No stridor. No decreased breath sounds, wheezing, rhonchi or rales.  Chest:     Chest wall: No tenderness.  Abdominal:     General: Bowel sounds are normal. There is no distension.     Palpations: Abdomen is soft. Abdomen is not rigid.     Tenderness: There is no abdominal tenderness. There is no guarding or rebound.  Musculoskeletal:        General: Normal range of motion.     Cervical back: Normal range of motion and neck supple. No edema, erythema or rigidity. No muscular tenderness. Normal range of motion.  Lymphadenopathy:     Head:     Right side of head: No submental or submandibular adenopathy.     Left side of head: No submental or submandibular adenopathy.     Cervical: No cervical adenopathy.  Skin:    General: Skin is warm and dry.     Coloration: Skin is not pale.     Findings: No rash.     Nails: There is no clubbing.  Neurological:     Mental Status: He is alert and oriented to person, place, and time.     Sensory: No sensory deficit.  Psychiatric:        Attention and Perception: He is inattentive.        Mood and Affect: Mood is depressed. Affect is tearful.        Speech: Speech is delayed.        Behavior: Behavior is withdrawn. Behavior is not combative.        Thought Content: Thought content includes suicidal ideation. Thought content does not include homicidal ideation. Thought content does not include homicidal or suicidal plan.        Cognition and Memory: Cognition and memory normal.        Judgment: Judgment normal. Judgment is not impulsive or inappropriate.    No evidence of cuts on the skin that are recent     Assessment & Plan:   Problem List Items Addressed This Visit   None 35 minutes spent excess time needed to assess suicidal ideation No follow-ups on file.   Shan Levans, MD

## 2022-08-15 ENCOUNTER — Other Ambulatory Visit: Payer: Self-pay

## 2022-08-15 NOTE — Progress Notes (Signed)
   Ernesto Zyrion Nikolic 01/06/1986 [id number]  Patient outreached by Mack Guise , PharmD Candidate on 08/15/2022.  Blood Pressure Readings: Last documented ambulatory systolic blood pressure: 146 Last documented ambulatory diastolic blood pressure: 84 Does the patient have a validated home blood pressure machine?: No (Patient expressed interesting in BP cuff, if covered by insurance, could you see if provider would write for one) They report home readings NA  Medication review was performed. Is the patient taking their medications as prescribed?: Yes Differences from their prescribed list include: NA  The following barriers to adherence were noted: Does the patient have cost concerns?: No Does the patient have transportation concerns?: No Does the patient need assistance obtaining refills?: No Does the patient occassionally forget to take some of their prescribed medications?: No Does the patient feel like one/some of their medications make them feel poorly?: No Does the patient have questions or concerns about their medications?: No Does the patient have a follow up scheduled with their primary care provider/cardiologist?: Yes   Interventions: Interventions Completed: Medications were reviewed  The patient has follow up scheduled:  PCP: Storm Frisk, MD   Mack Guise, Student-PharmD

## 2022-08-16 ENCOUNTER — Other Ambulatory Visit: Payer: Self-pay

## 2022-08-16 ENCOUNTER — Ambulatory Visit: Payer: BLUE CROSS/BLUE SHIELD | Admitting: Critical Care Medicine

## 2022-08-16 ENCOUNTER — Encounter: Payer: Self-pay | Admitting: Critical Care Medicine

## 2022-08-16 ENCOUNTER — Ambulatory Visit: Payer: BLUE CROSS/BLUE SHIELD | Attending: Critical Care Medicine | Admitting: Critical Care Medicine

## 2022-08-16 ENCOUNTER — Other Ambulatory Visit: Payer: Self-pay | Admitting: Pharmacist

## 2022-08-16 ENCOUNTER — Other Ambulatory Visit (HOSPITAL_COMMUNITY): Payer: Self-pay

## 2022-08-16 VITALS — BP 135/83 | HR 89 | Wt 192.6 lb

## 2022-08-16 DIAGNOSIS — I10 Essential (primary) hypertension: Secondary | ICD-10-CM | POA: Diagnosis not present

## 2022-08-16 DIAGNOSIS — E1021 Type 1 diabetes mellitus with diabetic nephropathy: Secondary | ICD-10-CM

## 2022-08-16 DIAGNOSIS — Z794 Long term (current) use of insulin: Secondary | ICD-10-CM

## 2022-08-16 DIAGNOSIS — Z21 Asymptomatic human immunodeficiency virus [HIV] infection status: Secondary | ICD-10-CM

## 2022-08-16 DIAGNOSIS — E160 Drug-induced hypoglycemia without coma: Secondary | ICD-10-CM

## 2022-08-16 DIAGNOSIS — T383X5A Adverse effect of insulin and oral hypoglycemic [antidiabetic] drugs, initial encounter: Secondary | ICD-10-CM

## 2022-08-16 DIAGNOSIS — Z5941 Food insecurity: Secondary | ICD-10-CM

## 2022-08-16 DIAGNOSIS — F332 Major depressive disorder, recurrent severe without psychotic features: Secondary | ICD-10-CM

## 2022-08-16 DIAGNOSIS — Z5900 Homelessness unspecified: Secondary | ICD-10-CM

## 2022-08-16 DIAGNOSIS — Z597 Insufficient social insurance and welfare support: Secondary | ICD-10-CM

## 2022-08-16 LAB — GLUCOSE, POCT (MANUAL RESULT ENTRY): POC Glucose: 115 mg/dl — AB (ref 70–99)

## 2022-08-16 LAB — POCT GLYCOSYLATED HEMOGLOBIN (HGB A1C): HbA1c, POC (controlled diabetic range): 8.8 % — AB (ref 0.0–7.0)

## 2022-08-16 MED ORDER — INSULIN LISPRO (1 UNIT DIAL) 100 UNIT/ML (KWIKPEN)
1.0000 [IU] | PEN_INJECTOR | SUBCUTANEOUS | 11 refills | Status: DC
  Filled 2022-08-16: qty 6, 34d supply, fill #0
  Filled 2022-08-16: qty 15, 84d supply, fill #0
  Filled 2022-10-20: qty 6, 34d supply, fill #1
  Filled 2022-12-23: qty 6, 34d supply, fill #2
  Filled 2023-01-24: qty 6, 34d supply, fill #3

## 2022-08-16 MED ORDER — INSULIN PEN NEEDLE 31G X 5 MM MISC
1 refills | Status: DC
  Filled 2022-08-16: qty 100, 25d supply, fill #0
  Filled 2022-10-20: qty 100, 25d supply, fill #1

## 2022-08-16 MED ORDER — INSULIN GLARGINE-YFGN 100 UNIT/ML ~~LOC~~ SOPN
16.0000 [IU] | PEN_INJECTOR | Freq: Every day | SUBCUTANEOUS | 1 refills | Status: DC
Start: 2022-08-16 — End: 2022-10-02
  Filled 2022-08-16: qty 6, 37d supply, fill #0

## 2022-08-16 MED ORDER — FREESTYLE LIBRE 2 SENSOR MISC
1 refills | Status: DC
Start: 2022-08-16 — End: 2022-08-16
  Filled 2022-08-16: qty 2, 28d supply, fill #0

## 2022-08-16 MED ORDER — VALSARTAN-HYDROCHLOROTHIAZIDE 320-25 MG PO TABS
1.0000 | ORAL_TABLET | Freq: Every day | ORAL | 3 refills | Status: DC
  Filled 2022-08-16: qty 30, 30d supply, fill #0
  Filled 2022-11-11: qty 30, 30d supply, fill #1

## 2022-08-16 MED ORDER — SERTRALINE HCL 100 MG PO TABS
150.0000 mg | ORAL_TABLET | Freq: Every day | ORAL | 3 refills | Status: DC
  Filled 2022-08-16: qty 45, 30d supply, fill #0
  Filled 2022-10-20: qty 45, 30d supply, fill #1
  Filled 2022-12-23: qty 45, 30d supply, fill #2

## 2022-08-16 MED ORDER — ACCU-CHEK SOFTCLIX LANCETS MISC
6 refills | Status: DC
Start: 2022-08-16 — End: 2023-02-12
  Filled 2022-08-16: qty 100, 33d supply, fill #0

## 2022-08-16 MED ORDER — ACCU-CHEK GUIDE VI STRP
ORAL_STRIP | 6 refills | Status: DC
Start: 2022-08-16 — End: 2023-02-12
  Filled 2022-08-16: qty 100, 33d supply, fill #0

## 2022-08-16 MED ORDER — HYDRALAZINE HCL 25 MG PO TABS
25.0000 mg | ORAL_TABLET | Freq: Three times a day (TID) | ORAL | 2 refills | Status: DC
Start: 2022-08-16 — End: 2023-01-06
  Filled 2022-08-16: qty 90, 30d supply, fill #0
  Filled 2022-11-11: qty 90, 30d supply, fill #1
  Filled 2022-12-23: qty 90, 30d supply, fill #2

## 2022-08-16 MED ORDER — ACCU-CHEK GUIDE W/DEVICE KIT
PACK | 0 refills | Status: AC
Start: 2022-08-16 — End: ?
  Filled 2022-08-16: qty 1, 30d supply, fill #0

## 2022-08-16 MED ORDER — GENVOYA 150-150-200-10 MG PO TABS
1.0000 | ORAL_TABLET | Freq: Every day | ORAL | 5 refills | Status: DC
  Filled 2022-08-16: qty 30, 30d supply, fill #0

## 2022-08-16 MED ORDER — DEXCOM G7 SENSOR MISC
3 refills | Status: DC
Start: 2022-08-16 — End: 2023-02-12
  Filled 2022-08-16: qty 3, 30d supply, fill #0
  Filled 2023-01-24: qty 3, 30d supply, fill #1

## 2022-08-16 MED ORDER — INSULIN GLARGINE 100 UNIT/ML SOLOSTAR PEN
16.0000 [IU] | PEN_INJECTOR | Freq: Every day | SUBCUTANEOUS | 2 refills | Status: DC
  Filled 2022-08-16: qty 6, 37d supply, fill #0

## 2022-08-16 MED ORDER — DEXCOM G7 RECEIVER DEVI
0 refills | Status: DC
Start: 2022-08-16 — End: 2023-01-24
  Filled 2022-08-16: qty 1, 90d supply, fill #0

## 2022-08-16 MED ORDER — FREESTYLE LIBRE 2 READER DEVI
0 refills | Status: DC
Start: 2022-08-16 — End: 2022-08-16
  Filled 2022-08-16: qty 1, 30d supply, fill #0

## 2022-08-16 MED ORDER — TRUEPLUS LANCETS 28G MISC
1 refills | Status: DC
  Filled 2022-08-16: qty 100, 50d supply, fill #0

## 2022-08-16 MED ORDER — GENVOYA 150-150-200-10 MG PO TABS
1.0000 | ORAL_TABLET | Freq: Every day | ORAL | 5 refills | Status: DC
Start: 2022-08-16 — End: 2022-08-16
  Filled 2022-08-16: qty 30, 30d supply, fill #0

## 2022-08-16 MED ORDER — TRUE METRIX BLOOD GLUCOSE TEST VI STRP
ORAL_STRIP | 2 refills | Status: DC
  Filled 2022-08-16: qty 100, 25d supply, fill #0
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
## 2022-08-16 NOTE — Assessment & Plan Note (Signed)
Continue with sertraline  

## 2022-08-16 NOTE — Patient Instructions (Signed)
Clinical pharmacy met with you today to get you a continuous glucose monitor  All your medications were refilled sent to our pharmacy downstairs and ask for an early refill because your medicines were taken from you  Increase insulin glargine to 16 units daily Short acting insulin lispro take it before meals based on how much carbohydrates you are taking in  Return to see Dr. Delford Field 2 months and see Franky Macho our clinical pharmacist in 4 weeks  Since you are at [facility] remember we have a medical clinic every Wednesday in the afternoon we can always follow you up there as well

## 2022-08-16 NOTE — Assessment & Plan Note (Signed)
Hypertension well-controlled no change in medication continue with hydralazine valsartan HCT

## 2022-08-16 NOTE — Assessment & Plan Note (Signed)
Resolved

## 2022-08-16 NOTE — Assessment & Plan Note (Addendum)
Type 1 diabetes plan for this patient is to increase Semglee insulin to 16 units daily and continue with Humalog 1 to 6 units to be taken with food 1 unit for every 15 g of carbohydrates taken Pharmacy met with the patient to get him on a CGM monitor
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
## 2022-08-17 ENCOUNTER — Ambulatory Visit (INDEPENDENT_AMBULATORY_CARE_PROVIDER_SITE_OTHER): Payer: BLUE CROSS/BLUE SHIELD

## 2022-08-17 ENCOUNTER — Other Ambulatory Visit: Payer: Self-pay

## 2022-08-17 DIAGNOSIS — A539 Syphilis, unspecified: Secondary | ICD-10-CM | POA: Diagnosis not present

## 2022-08-17 MED ORDER — PENICILLIN G BENZATHINE 1200000 UNIT/2ML IM SUSY
1.2000 10*6.[IU] | PREFILLED_SYRINGE | Freq: Once | INTRAMUSCULAR | Status: AC
Start: 2022-08-17 — End: 2022-08-17
  Administered 2022-08-17: 1.2 10*6.[IU] via INTRAMUSCULAR

## 2022-08-17 NOTE — Progress Notes (Signed)
Nurse Visit  [name] [birth year]   Allergies  Allergen Reactions   Shellfish Allergy Anaphylaxis and Hives   Aspirin Other (See Comments)    Lump in armpit   Reviewed allergies with patient.   Medications administered: Bicillin 2.4 million units IM (2 of 3)   Patient tolerated well.   Sandie Ano, RN
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
## 2022-08-22 ENCOUNTER — Encounter: Payer: Self-pay | Admitting: *Deleted

## 2022-08-22 DIAGNOSIS — Z139 Encounter for screening, unspecified: Secondary | ICD-10-CM

## 2022-08-22 NOTE — Congregational Nurse Program (Signed)
  Dept: [phone]   Congregational Nurse Program Note  Date of Encounter: 08/22/2022  Past Medical History: Past Medical History:  Diagnosis Date   Anxiety    Asthma    Closed displaced comminuted fracture of shaft of right tibia 11/01/2017   Closed fracture of base of fifth metatarsal bone of right foot 11/13/2017   Closed fracture of medial malleolus of right ankle 10/31/2017   Depression    Headache    "only w/stress" (11/01/2017)   HIV (human immunodeficiency virus infection) (HCC)    Hypoglycemia due to insulin 06/27/2022   Migraine    "a few/month" (11/01/2017)   Refusal of blood transfusions as patient is Jehovah's Witness    Seizure (HCC)    "only w/my low blood sugars"  (11/01/2017)   Type I diabetes mellitus (HCC)     Encounter Details:  CNP Questionnaire - 08/22/22 1213       Questionnaire   Ask client: Do you give verbal consent for me to treat you today? Yes    Student Assistance N/A    Location Patient Served  [facility]    Visit Setting with Client Organization    Patient Status Unhoused    Insurance Uninsured ([employer] Card/Care Connects/Self-Pay/Medicaid Family Planning)    Insurance/Financial Assistance Referral N/A    Medication N/A    Medical Provider Yes    Screening Referrals Made N/A    Medical Referrals Made N/A    Food N/A    Transportation N/A    Housing/Utilities No permanent housing    Interpersonal Safety Do not feel safe at current residence    Interventions Advocate/Support;Counsel    Abnormal to Normal Screening Since Last CN Visit N/A    Screenings CN Performed Blood Pressure;Blood Glucose;Pulse Ox    Sent Client to Lab for: N/A    Did client attend any of the following based off CNs referral or appointments made? N/A    ED Visit Averted N/A    Life-Saving Intervention Made N/A            Client seen at [facility] as f/u from previous CN visit. Checked vitals. Blood pressure (!) 155/91, pulse 92, SpO2 97 %. CBG 105. Client reports he has all  medications and taking as prescribed. Offered support and encouragement talking to client about stressors related to staying at a shelter and sleep disturbance with job schedule. No other issues or concerns reported at this time. Lakeith Careaga W RN CN

## 2022-08-24 ENCOUNTER — Other Ambulatory Visit: Payer: Self-pay

## 2022-08-24 ENCOUNTER — Ambulatory Visit (INDEPENDENT_AMBULATORY_CARE_PROVIDER_SITE_OTHER): Payer: BLUE CROSS/BLUE SHIELD

## 2022-08-24 DIAGNOSIS — A539 Syphilis, unspecified: Secondary | ICD-10-CM | POA: Diagnosis not present

## 2022-08-24 MED ORDER — PENICILLIN G BENZATHINE 1200000 UNIT/2ML IM SUSY
2.4000 10*6.[IU] | PREFILLED_SYRINGE | Freq: Once | INTRAMUSCULAR | Status: AC
Start: 2022-08-24 — End: 2022-08-24
  Administered 2022-08-24: 2.4 10*6.[IU] via INTRAMUSCULAR

## 2022-09-13 ENCOUNTER — Ambulatory Visit: Payer: Self-pay | Admitting: Pharmacist

## 2022-09-14 ENCOUNTER — Other Ambulatory Visit (HOSPITAL_COMMUNITY): Payer: Self-pay

## 2022-09-14 ENCOUNTER — Other Ambulatory Visit: Payer: Self-pay | Admitting: Family

## 2022-09-14 DIAGNOSIS — Z21 Asymptomatic human immunodeficiency virus [HIV] infection status: Secondary | ICD-10-CM

## 2022-09-14 MED ORDER — GENVOYA 150-150-200-10 MG PO TABS
1.0000 | ORAL_TABLET | Freq: Every day | ORAL | 5 refills | Status: DC
Start: 2022-09-14 — End: 2023-09-11

## 2022-09-19 ENCOUNTER — Encounter: Payer: Self-pay | Admitting: *Deleted

## 2022-09-19 LAB — BASIC METABOLIC PANEL
Glucose: 57
Glucose: 107

## 2022-09-19 NOTE — Congregational Nurse Program (Signed)
  Dept: [phone]   Congregational Nurse Program Note  Date of Encounter: 09/19/2022  Past Medical History: Past Medical History:  Diagnosis Date   Anxiety    Asthma    Closed displaced comminuted fracture of shaft of right tibia 11/01/2017   Closed fracture of base of fifth metatarsal bone of right foot 11/13/2017   Closed fracture of medial malleolus of right ankle 10/31/2017   Depression    Headache    "only w/stress" (11/01/2017)   HIV (human immunodeficiency virus infection) (HCC)    Hypoglycemia due to insulin 06/27/2022   Migraine    "a few/month" (11/01/2017)   Refusal of blood transfusions as patient is Jehovah's Witness    Seizure (HCC)    "only w/my low blood sugars"  (11/01/2017)   Type I diabetes mellitus (HCC)     Encounter Details:  CNP Questionnaire - 09/19/22 1015       Questionnaire   Ask client: Do you give verbal consent for me to treat you today? Yes    Student Assistance N/A    Location Patient Served  [facility]    Visit Setting with Client Organization    Patient Status Unhoused    Insurance Uninsured ([employer] Card/Care Connects/Self-Pay/Medicaid Family Planning)    Insurance/Financial Assistance Referral N/A    Medication N/A    Medical Provider Yes    Screening Referrals Made N/A    Medical Referrals Made N/A    Medical Appointment Made N/A    Recently w/o PCP, now 1st time PCP visit completed due to CNs referral or appointment made N/A    Food Have Food Insecurities    Transportation N/A    Housing/Utilities No permanent housing    Interpersonal Safety Do not feel safe at current residence    Interventions Advocate/Support    Abnormal to Normal Screening Since Last CN Visit N/A    Screenings CN Performed Blood Glucose    Sent Client to Lab for: N/A    Did client attend any of the following based off CNs referral or appointments made? N/A    ED Visit Averted Yes    Life-Saving Intervention Made N/A            Client came to nurse's office  sweating and week. Checked cbg 57. Gave orange juice and rechecked in 15 minutes. CBG 107. Client left office to go to cafeteria for lunch. Client reports he has insulin at shelter and is waiting to get a new device for his arm. He reports he has glucose tablets upstairs near his bed. Offered support and encouraged client to keep candy or tablets with him during the day also. Looked for client later in the day and could not locate him at [facility]. Kaius Daino W RN CN

## 2022-10-01 ENCOUNTER — Observation Stay (HOSPITAL_COMMUNITY)
Admission: EM | Admit: 2022-10-01 | Discharge: 2022-10-02 | Disposition: A | Discharge status: Home / Self Care | Payer: MEDICAID | Attending: Internal Medicine | Admitting: Internal Medicine

## 2022-10-01 ENCOUNTER — Encounter (HOSPITAL_COMMUNITY): Payer: Self-pay

## 2022-10-01 ENCOUNTER — Emergency Department (HOSPITAL_COMMUNITY): Payer: MEDICAID

## 2022-10-01 ENCOUNTER — Other Ambulatory Visit: Payer: Self-pay

## 2022-10-01 DIAGNOSIS — E1021 Type 1 diabetes mellitus with diabetic nephropathy: Secondary | ICD-10-CM | POA: Diagnosis not present

## 2022-10-01 DIAGNOSIS — Z794 Long term (current) use of insulin: Secondary | ICD-10-CM | POA: Diagnosis not present

## 2022-10-01 DIAGNOSIS — J45909 Unspecified asthma, uncomplicated: Secondary | ICD-10-CM | POA: Diagnosis not present

## 2022-10-01 DIAGNOSIS — J4520 Mild intermittent asthma, uncomplicated: Secondary | ICD-10-CM | POA: Diagnosis not present

## 2022-10-01 DIAGNOSIS — N1831 Chronic kidney disease, stage 3a: Secondary | ICD-10-CM | POA: Diagnosis not present

## 2022-10-01 DIAGNOSIS — I129 Hypertensive chronic kidney disease with stage 1 through stage 4 chronic kidney disease, or unspecified chronic kidney disease: Secondary | ICD-10-CM | POA: Insufficient documentation

## 2022-10-01 DIAGNOSIS — Z79899 Other long term (current) drug therapy: Secondary | ICD-10-CM | POA: Insufficient documentation

## 2022-10-01 DIAGNOSIS — E10649 Type 1 diabetes mellitus with hypoglycemia without coma: Principal | ICD-10-CM | POA: Insufficient documentation

## 2022-10-01 DIAGNOSIS — Z1152 Encounter for screening for COVID-19: Secondary | ICD-10-CM | POA: Diagnosis not present

## 2022-10-01 DIAGNOSIS — E109 Type 1 diabetes mellitus without complications: Secondary | ICD-10-CM | POA: Diagnosis present

## 2022-10-01 DIAGNOSIS — I10 Essential (primary) hypertension: Secondary | ICD-10-CM | POA: Diagnosis not present

## 2022-10-01 DIAGNOSIS — B20 Human immunodeficiency virus [HIV] disease: Secondary | ICD-10-CM

## 2022-10-01 DIAGNOSIS — N182 Chronic kidney disease, stage 2 (mild): Secondary | ICD-10-CM | POA: Diagnosis not present

## 2022-10-01 DIAGNOSIS — Z21 Asymptomatic human immunodeficiency virus [HIV] infection status: Secondary | ICD-10-CM

## 2022-10-01 DIAGNOSIS — F17290 Nicotine dependence, other tobacco product, uncomplicated: Secondary | ICD-10-CM | POA: Diagnosis not present

## 2022-10-01 DIAGNOSIS — E162 Hypoglycemia, unspecified: Principal | ICD-10-CM | POA: Insufficient documentation

## 2022-10-01 DIAGNOSIS — E1022 Type 1 diabetes mellitus with diabetic chronic kidney disease: Secondary | ICD-10-CM | POA: Insufficient documentation

## 2022-10-01 LAB — ETHANOL: Alcohol, Ethyl (B): <10 mg/dL (ref <10)

## 2022-10-01 LAB — BASIC METABOLIC PANEL
Anion gap: 14 (ref 5–15)
BUN: 16 mg/dL (ref 6–20)
CO2: 24 mmol/L (ref 22–32)
Calcium: 9.7 mg/dL (ref 8.9–10.3)
Chloride: 99 mmol/L (ref 98–111)
Creatinine, Ser: 1.26 mg/dL — ABNORMAL HIGH (ref 0.61–1.24)
GFR, Estimated: >60 mL/min (ref >60)
Glucose, Bld: 139 mg/dL — ABNORMAL HIGH (ref 70–99)
Potassium: 3.8 mmol/L (ref 3.5–5.1)
Sodium: 137 mmol/L (ref 135–145)

## 2022-10-01 LAB — CBC
HCT: 46.7 % (ref 39.0–52.0)
HCT: 42.7 % (ref 39.0–52.0)
Hemoglobin: 15.7 g/dL (ref 13.0–17.0)
Hemoglobin: 14.8 g/dL (ref 13.0–17.0)
MCH: 30.2 pg (ref 26.0–34.0)
MCH: 30.5 pg (ref 26.0–34.0)
MCHC: 33.6 g/dL (ref 30.0–36.0)
MCHC: 34.7 g/dL (ref 30.0–36.0)
MCV: 89.8 fL (ref 80.0–100.0)
MCV: 88 fL (ref 80.0–100.0)
Platelets: 284 10*3/uL (ref 150–400)
Platelets: 289 10*3/uL (ref 150–400)
RBC: 5.2 MIL/uL (ref 4.22–5.81)
RBC: 4.85 MIL/uL (ref 4.22–5.81)
RDW: 13.9 % (ref 11.5–15.5)
RDW: 13.6 % (ref 11.5–15.5)
WBC: 8.2 10*3/uL (ref 4.0–10.5)
WBC: 8.4 10*3/uL (ref 4.0–10.5)
nRBC: 0 % (ref 0.0–0.2)
nRBC: 0 % (ref 0.0–0.2)

## 2022-10-01 LAB — COMPREHENSIVE METABOLIC PANEL
ALT: 23 U/L (ref 0–44)
AST: 25 U/L (ref 15–41)
Albumin: 4.4 g/dL (ref 3.5–5.0)
Alkaline Phosphatase: 66 U/L (ref 38–126)
Anion gap: 13 (ref 5–15)
BUN: 12 mg/dL (ref 6–20)
CO2: 22 mmol/L (ref 22–32)
Calcium: 10.1 mg/dL (ref 8.9–10.3)
Chloride: 103 mmol/L (ref 98–111)
Creatinine, Ser: 1.31 mg/dL — ABNORMAL HIGH (ref 0.61–1.24)
GFR, Estimated: >60 mL/min (ref >60)
Glucose, Bld: 211 mg/dL — ABNORMAL HIGH (ref 70–99)
Potassium: 3.9 mmol/L (ref 3.5–5.1)
Sodium: 138 mmol/L (ref 135–145)
Total Bilirubin: 0.9 mg/dL (ref 0.3–1.2)
Total Protein: 7.8 g/dL (ref 6.5–8.1)

## 2022-10-01 LAB — CREATININE, SERUM
Creatinine, Ser: 1.25 mg/dL — ABNORMAL HIGH (ref 0.61–1.24)
GFR, Estimated: >60 mL/min (ref >60)

## 2022-10-01 LAB — CBG MONITORING, ED
Glucose-Capillary: 52 mg/dL — ABNORMAL LOW (ref 70–99)
Glucose-Capillary: 52 mg/dL — ABNORMAL LOW (ref 70–99)
Glucose-Capillary: 85 mg/dL (ref 70–99)
Glucose-Capillary: 82 mg/dL (ref 70–99)
Glucose-Capillary: 129 mg/dL — ABNORMAL HIGH (ref 70–99)

## 2022-10-01 LAB — GLUCOSE, CAPILLARY
Glucose-Capillary: 126 mg/dL — ABNORMAL HIGH (ref 70–99)
Glucose-Capillary: 83 mg/dL (ref 70–99)
Glucose-Capillary: 139 mg/dL — ABNORMAL HIGH (ref 70–99)

## 2022-10-01 LAB — URINALYSIS, ROUTINE W REFLEX MICROSCOPIC
Bacteria, UA: NONE SEEN
Bilirubin Urine: NEGATIVE
Glucose, UA: 50 mg/dL — AB
Ketones, ur: NEGATIVE mg/dL
Leukocytes,Ua: NEGATIVE
Nitrite: NEGATIVE
Protein, ur: >=300 mg/dL — AB
Specific Gravity, Urine: 1.015 (ref 1.005–1.030)
pH: 7 (ref 5.0–8.0)

## 2022-10-01 LAB — TROPONIN I (HIGH SENSITIVITY)
Troponin I (High Sensitivity): 5 ng/L (ref <18)
Troponin I (High Sensitivity): 4 ng/L (ref <18)

## 2022-10-01 LAB — SARS CORONAVIRUS 2 BY RT PCR: SARS Coronavirus 2 by RT PCR: NEGATIVE

## 2022-10-01 LAB — I-STAT CG4 LACTIC ACID, ED: Lactic Acid, Venous: 1.3 mmol/L (ref 0.5–1.9)

## 2022-10-01 MED ORDER — ACETAMINOPHEN 325 MG PO TABS: 650.0000 mg | ORAL_TABLET | Freq: Four times a day (QID) | ORAL | Status: DC | PRN

## 2022-10-01 MED ORDER — ELVITEG-COBIC-EMTRICIT-TENOFAF 150-150-200-10 MG PO TABS
1.0000 | ORAL_TABLET | Freq: Every day | ORAL | Status: DC
  Administered 2022-10-02: 1 via ORAL
  Filled 2022-10-01 (×2): qty 1

## 2022-10-01 MED ORDER — ENOXAPARIN SODIUM 40 MG/0.4ML IJ SOSY
40.0000 mg | PREFILLED_SYRINGE | INTRAMUSCULAR | Status: DC
  Administered 2022-10-01: 40 mg via SUBCUTANEOUS
  Filled 2022-10-01: qty 0.4

## 2022-10-01 MED ORDER — DEXTROSE IN LACTATED RINGERS 5 % IV SOLN: INTRAVENOUS | Status: DC

## 2022-10-01 MED ORDER — HYDROCHLOROTHIAZIDE 25 MG PO TABS
25.0000 mg | ORAL_TABLET | Freq: Every day | ORAL | Status: DC
  Administered 2022-10-01: 25 mg via ORAL
  Filled 2022-10-01: qty 1

## 2022-10-01 MED ORDER — VALSARTAN-HYDROCHLOROTHIAZIDE 320-25 MG PO TABS: 1.0000 | ORAL_TABLET | Freq: Every day | ORAL | Status: DC

## 2022-10-01 MED ORDER — DEXTROSE 50 % IV SOLN
INTRAVENOUS | Status: AC
  Administered 2022-10-01: 50 mL via INTRAVENOUS
  Filled 2022-10-01: qty 50

## 2022-10-01 MED ORDER — ONDANSETRON HCL 4 MG/2ML IJ SOLN: 4.0000 mg | Freq: Four times a day (QID) | INTRAMUSCULAR | Status: DC | PRN

## 2022-10-01 MED ORDER — HYDROCHLOROTHIAZIDE 25 MG PO TABS
25.0000 mg | ORAL_TABLET | Freq: Every day | ORAL | Status: DC
  Administered 2022-10-02: 25 mg via ORAL
  Filled 2022-10-01: qty 1

## 2022-10-01 MED ORDER — IRBESARTAN 300 MG PO TABS
300.0000 mg | ORAL_TABLET | Freq: Every day | ORAL | Status: DC
  Administered 2022-10-02: 300 mg via ORAL
  Filled 2022-10-01: qty 1

## 2022-10-01 MED ORDER — ONDANSETRON HCL 4 MG PO TABS: 4.0000 mg | ORAL_TABLET | Freq: Four times a day (QID) | ORAL | Status: DC | PRN

## 2022-10-01 MED ORDER — ACETAMINOPHEN 650 MG RE SUPP: 650.0000 mg | Freq: Four times a day (QID) | RECTAL | Status: DC | PRN

## 2022-10-01 MED ORDER — DEXTROSE 10 % IV SOLN: INTRAVENOUS | Status: DC

## 2022-10-01 MED ORDER — IRBESARTAN 300 MG PO TABS
300.0000 mg | ORAL_TABLET | Freq: Every day | ORAL | Status: DC
  Administered 2022-10-01: 300 mg via ORAL
  Filled 2022-10-01: qty 1

## 2022-10-01 MED ORDER — DEXTROSE 50 % IV SOLN: 50.0000 mL | Freq: Once | INTRAVENOUS | Status: AC

## 2022-10-01 MED ORDER — SODIUM CHLORIDE 0.9% FLUSH
3.0000 mL | Freq: Two times a day (BID) | INTRAVENOUS | Status: DC
  Administered 2022-10-01 – 2022-10-02 (×2): 3 mL via INTRAVENOUS

## 2022-10-01 MED ORDER — SERTRALINE HCL 50 MG PO TABS
150.0000 mg | ORAL_TABLET | Freq: Every day | ORAL | Status: DC
  Administered 2022-10-01 – 2022-10-02 (×2): 150 mg via ORAL
  Filled 2022-10-01 (×2): qty 1

## 2022-10-01 MED ORDER — ELVITEG-COBIC-EMTRICIT-TENOFAF 150-150-200-10 MG PO TABS: 1.0000 | ORAL_TABLET | Freq: Every day | ORAL | Status: DC

## 2022-10-01 MED ORDER — SODIUM CHLORIDE 0.9% FLUSH: 3.0000 mL | INTRAVENOUS | Status: DC | PRN

## 2022-10-01 MED ORDER — HYDRALAZINE HCL 25 MG PO TABS
25.0000 mg | ORAL_TABLET | Freq: Three times a day (TID) | ORAL | Status: DC
  Administered 2022-10-01 – 2022-10-02 (×3): 25 mg via ORAL
  Filled 2022-10-01 (×4): qty 1

## 2022-10-01 MED ORDER — SODIUM CHLORIDE 0.9 % IV SOLN: 250.0000 mL | INTRAVENOUS | Status: DC | PRN

## 2022-10-01 NOTE — ED Provider Notes (Signed)
[name] AT [facility] Provider Note   CSN: [id number] Arrival date & time: 10/01/22  0818     History  Chief Complaint  Patient presents with   Hypoglycemia    [name] is a 37 y.o. male.  Patient is a 37 year old male type I diabetic, HIV and hypertension presenting to the emergency Adams with hypoglycemia and altered mental status.  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  They state on their arrival his blood sugar was 25.  D10 was administered which initially improved but his blood sugar dropped again to 35 and he was given a second amp of D10 after which his sugar improved to the 100s.  He was initially awake on arrival and did report taking his insulin this morning but had not yet eaten breakfast.  He states that he is having headache and bodyaches.  Denies any chest pain or recent fevers.  Denies nausea, vomiting or diarrhea, dysuria or hematuria, cough or congestion.  He continues to be drowsy and further history is limited.  The history is provided by the patient and the EMS personnel. The history is limited by the condition of the patient.  Hypoglycemia      Home Medications Prior to Admission medications   Medication Sig Start Date End Date Taking? Authorizing Provider  Accu-Chek Softclix Lancets lancets Use to check blood sugar three times daily. 08/16/22   Storm Frisk, MD  acetaminophen (TYLENOL) 500 MG tablet Take 1 tablet (500 mg total) by mouth every 6 (six) hours as needed. 07/13/22   Redwine, Madison A, PA-C  blood glucose meter kit and supplies Dispense based on patient and insurance preference. Use up to four times daily as directed: For blood sugar monitoring 10/08/20   Armandina Stammer I, NP  Blood Glucose Monitoring Suppl (ACCU-CHEK GUIDE) w/Device KIT Use to check blood sugar 3 times daily. 08/16/22   Storm Frisk, MD   Continuous Glucose Receiver (DEXCOM G7 RECEIVER) DEVI Use to check blood sugar continuously 08/16/22   Storm Frisk, MD  Continuous Glucose Sensor (DEXCOM G7 SENSOR) MISC Use to check blood sugar continuously. Change sensors once every 10 days. 08/16/22   Storm Frisk, MD  elvitegravir-cobicistat-emtricitabine-tenofovir (GENVOYA) 150-150-200-10 MG TABS tablet Take 1 tablet by mouth daily with breakfast. 09/14/22   Veryl Speak, FNP  glucose blood (ACCU-CHEK GUIDE) test strip Use to check blood sugar 3 times daily. 08/16/22   Storm Frisk, MD  hydrALAZINE (APRESOLINE) 25 MG tablet Take 1 tablet (25 mg total) by mouth every 8 (eight) hours. For hypertension 08/16/22   Storm Frisk, MD  ibuprofen (ADVIL) 600 MG tablet Take 1 tablet (600 mg total) by mouth every 6 (six) hours as needed. 07/13/22   Redwine, Madison A, PA-C  insulin glargine-yfgn (SEMGLEE, YFGN,) 100 UNIT/ML Pen Inject 16 Units into the skin daily. 08/16/22   Storm Frisk, MD  insulin lispro (HUMALOG) 100 UNIT/ML KwikPen Inject 1-6 Units into the skin as directed. ONLY with food - 1 unit per 15 grams of carbohydrates (bread, pasta, fruit, etc) 08/16/22   Storm Frisk, MD  Insulin Pen Needle 31G X 5 MM MISC Use with insulin pen 08/16/22   Storm Frisk, MD  sertraline (ZOLOFT) 100 MG tablet Take 1.5 tablets (150 mg total) by mouth daily. 08/16/22 12/14/22  Storm Frisk, MD  valsartan-hydrochlorothiazide (  DIOVAN-HCT) 320-25 MG tablet Take 1 tablet by mouth daily. 08/16/22   Storm Frisk, MD  pravastatin (PRAVACHOL) 20 MG tablet Take 1 tablet (20 mg total) by mouth daily. Patient not taking: Reported on 03/31/2019 03/08/19 07/08/19  Claiborne Rigg, NP      Allergies    Shellfish allergy and Aspirin    Review of Systems   Review of Systems  Physical Exam Updated Vital Signs BP (!) 142/97   Pulse (!) 57   Temp (!) 94.6 F (34.8 C) (Oral)   Resp 15   SpO2 100%  Physical Exam Vitals and nursing  note reviewed.  Constitutional:      Appearance: He is ill-appearing and diaphoretic.     Comments: Drowsy, arousable to noxious stimuli  HENT:     Head: Normocephalic and atraumatic.     Nose: Nose normal.     Mouth/Throat:     Mouth: Mucous membranes are moist.     Pharynx: Oropharynx is clear.  Eyes:     Extraocular Movements: Extraocular movements intact.     Conjunctiva/sclera: Conjunctivae normal.     Pupils: Pupils are equal, round, and reactive to light.  Cardiovascular:     Rate and Rhythm: Normal rate and regular rhythm.     Heart sounds: Normal heart sounds.  Pulmonary:     Effort: Pulmonary effort is normal.     Breath sounds: Normal breath sounds.  Abdominal:     General: Abdomen is flat.     Palpations: Abdomen is soft.     Tenderness: There is no abdominal tenderness.  Musculoskeletal:        General: Normal range of motion.     Cervical back: Normal range of motion.  Skin:    General: Skin is warm.  Neurological:     Sensory: No sensory deficit.     Motor: No weakness.  Psychiatric:        Mood and Affect: Mood normal.     ED Results / Procedures / Treatments   Labs (all labs ordered are listed, but only abnormal results are displayed) Labs Reviewed  COMPREHENSIVE METABOLIC PANEL - Abnormal; Notable for the following components:      Result Value   Glucose, Bld 211 (*)    Creatinine, Ser 1.31 (*)    All other components within normal limits  CBG MONITORING, ED - Abnormal; Notable for the following components:   Glucose-Capillary 52 (*)    All other components within normal limits  CBG MONITORING, ED - Abnormal; Notable for the following components:   Glucose-Capillary 52 (*)    All other components within normal limits  SARS CORONAVIRUS 2 BY RT PCR  CULTURE, BLOOD (ROUTINE X 2)  CBC  ETHANOL  URINALYSIS, ROUTINE W REFLEX MICROSCOPIC  CBG MONITORING, ED  I-STAT CG4 LACTIC ACID, ED  CBG MONITORING, ED  TROPONIN I (HIGH SENSITIVITY)  TROPONIN  I (HIGH SENSITIVITY)    EKG EKG Interpretation Date/Time:  Saturday October 01 2022 08:28:13 EDT Ventricular Rate:  58 PR Interval:  130 QRS Duration:  97 QT Interval:  434 QTC Calculation: 427 R Axis:   4  Text Interpretation: Sinus rhythm Probable anteroseptal infarct, old Borderline ST elevation, lateral leads No significant change since last tracing Confirmed by Elayne Snare ([id number]) on 10/01/2022 8:49:56 AM  Radiology DG Chest Port 1 View  Result Date: 10/01/2022 CLINICAL DATA:  Altered mental status EXAM: PORTABLE CHEST 1 VIEW COMPARISON:  03/21/2022 FINDINGS: Artifact from EKG leads. Normal heart  size and mediastinal contours. No acute infiltrate or edema. Nipple shadow seen on the left, also seen on priors. No effusion or pneumothorax. No acute osseous findings. IMPRESSION: No active disease. Electronically Signed   By: Tiburcio Pea M.D.   On: 10/01/2022 09:18    Procedures .Critical Care  Performed by: Rexford Maus, DO Authorized by: Rexford Maus, DO   Critical care provider statement:    Critical care time (minutes):  40   Critical care time was exclusive of:  Separately billable procedures and treating other patients   Critical care was necessary to treat or prevent imminent or life-threatening deterioration of the following conditions:  Endocrine crisis   Critical care was time spent personally by me on the following activities:  Development of treatment plan with patient or surrogate, discussions with consultants, evaluation of patient's response to treatment, examination of patient, obtaining history from patient or surrogate, ordering and performing treatments and interventions, ordering and review of laboratory studies, ordering and review of radiographic studies, pulse oximetry, re-evaluation of patient's condition and review of old charts   I assumed direction of critical care for this patient from another provider in my specialty: no     Care  discussed with: admitting provider       Medications Ordered in ED Medications  sodium chloride flush (NS) 0.9 % injection 3 mL (3 mLs Intravenous Not Given 10/01/22 0901)  sodium chloride flush (NS) 0.9 % injection 3 mL (has no administration in time range)  0.9 %  sodium chloride infusion (has no administration in time range)  dextrose 10 % infusion ( Intravenous New Bag/Given 10/01/22 0923)  dextrose 50 % solution 50 mL (50 mLs Intravenous Given 10/01/22 0834)    ED Course/ Medical Decision Making/ A&P Clinical Course as of 10/01/22 1051  Sat Oct 01, 2022  0915 Rpt accucheck 52, will start d10 drip [VK]  0948 Upon reassessment, patient drowsy but arousable to verbal stimulation now. Requesting to eat. Will trial juice. [VK]  1039 Repeat glucose improved. Will be admitted for hypoglycemia management. [VK]    Clinical Course User Index [VK] Rexford Maus, DO                                 Medical Decision Making This patient presents to the ED with chief complaint(s) of hypoglycemia, AMS with pertinent past medical history of DM, HTN, HIV which further complicates the presenting complaint. The complaint involves an extensive differential diagnosis and also carries with it a high risk of complications and morbidity.    The differential diagnosis includes hypoglycemia, infection, ACS, arrhythmia, medication induced  Additional history obtained: Additional history obtained from EMS  Records reviewed previous admission documents and Primary Care Documents  ED Course and Reassessment: On patient's arrival to the emergency [facility] he was cold and otherwise hemodynamically stable.  Initially awake but quickly became drowsy upon my evaluation.  Initial Accu-Chek was 55.  Patient had IV access placed by EMS and was given an amp of D50 with some improvement of his mental status and he was able to answer questions appropriately.  EKG on arrival showed normal sinus rhythm without acute  ischemic changes.  He was placed on a Bair hugger for hypothermia and will undergo workup to evaluate for cause of his hypoglycemia with close repeat Accu-Cheks.  He will be given food if he becomes awake enough and if he has further episodes  of hypoglycemia will require D10 drip.  Independent labs interpretation:  The following labs were independently interpreted: hypoglycemic, otherwise within normal range  Independent visualization of imaging: - I independently visualized the following imaging with scope of interpretation limited to determining acute life threatening conditions related to emergency care: CXR, which revealed no acute disease  Consultation: - Consulted or discussed management/test interpretation w/ external professional: hospitalist  Consideration for admission or further workup: patient requires admission for his hypoglycemia Social Determinants of health: homelessness    Amount and/or Complexity of Data Reviewed Labs: ordered. Radiology: ordered.  Risk Prescription drug management. Decision regarding hospitalization.          Final Clinical Impression(s) / ED Diagnoses Final diagnoses:  Hypoglycemia    Rx / DC Orders ED Discharge Orders     None         Rexford Maus, DO 10/01/22 1052

## 2022-10-01 NOTE — Assessment & Plan Note (Addendum)
Uncontrolled T1DM with HgA1c 8.8 in 07/2022.   Last hospitalization 05/2022 for hypoglycemia, then his dose of insulin was reduced.   Capillary glucose is currently 126 mg/dl.   Plan to continue IV dextrose 5% with LR at 100 ml hr.  Considering patient type 1 DM he is in risk for DKA.  Capillary glucose monitoring q 2 hrs for today. When glucose 250 mg/dl will resume basal insulin, likely 5 units, as long patient is tolerating po well.  Check BMP at 21:00 if sings of DKA, will need insulin drip per protocol.

## 2022-10-01 NOTE — Progress Notes (Signed)
DNR bractlet placed with 2nd RN Kabita RN

## 2022-10-01 NOTE — ED Notes (Signed)
ED TO INPATIENT HANDOFF REPORT  ED Nurse Name and Phone #: Marcello Moores [phone]  S Name/Age/Gender [name] 36 y.o. male Room/Bed: 016C/016C  Code Status   Code Status: Full Code  Home/SNF/Other Home Patient oriented to: self, place, time, and situation Is this baseline? Yes   Triage Complete: Triage complete  Chief Complaint Hypoglycemia [E16.2]  Triage Note Pt arrives via EMS from work. EMS was called due to patient being lethargic. Upon ems arrival pt had a BG of 25. EMS administered D10 but BG quickly dropped to 35 after. EMS gave an additional dose of D10 which brought him to 155. PT arrives with a BG of 52. Pt is AxO, however, very lethargic.    Allergies Allergies  Allergen Reactions   Shellfish Allergy Anaphylaxis and Hives   Aspirin Other (See Comments)    Lump in armpit    Level of Care/Admitting Diagnosis ED Disposition     ED Disposition  Admit   Condition  --   Comment  Hospital Area: [facility] [[id number]]  Level of Care: Progressive [102]  Admit to Progressive based on following criteria: NEUROLOGICAL AND NEUROSURGICAL complex patients with significant risk of instability, who do not meet ICU criteria, yet require close observation or frequent assessment (< / = every 2 - 4 hours) with medical / nursing intervention.  May place patient in [facility] at [facility] or [facility] if equivalent level of care is available:: No  Covid Evaluation: Asymptomatic - no recent exposure (last 10 days) testing not required  Diagnosis: Hypoglycemia [242204]  Admitting Physician: Coralie Keens [[id number]]  Attending Physician: Coralie Keens [[id number]]          B Medical/Surgery History Past Medical History:  Diagnosis Date   Anxiety    Asthma    Closed displaced comminuted fracture of shaft of right tibia 11/01/2017   Closed fracture of base of fifth metatarsal bone of right foot 11/13/2017   Closed fracture of  medial malleolus of right ankle 10/31/2017   Depression    Headache    "only w/stress" (11/01/2017)   HIV (human immunodeficiency virus infection) (HCC)    Hypoglycemia due to insulin 06/27/2022   Migraine    "a few/month" (11/01/2017)   Refusal of blood transfusions as patient is Jehovah's Witness    Seizure (HCC)    "only w/my low blood sugars"  (11/01/2017)   Type I diabetes mellitus (HCC)    Past Surgical History:  Procedure Laterality Date   FRACTURE SURGERY     IM NAILING TIBIA Right 11/01/2017   INTRAMEDULLARY (IM) NAIL TIBIALRightGeneral   ORIF ANKLE FRACTURE Right 11/01/2017   ORIF ANKLE FRACTURE Right 11/01/2017   Procedure: OPEN REDUCTION INTERNAL FIXATION (ORIF) ANKLE FRACTURE;  Surgeon: Roby Lofts, MD;  Location: [facility] OR;  Service: Orthopedics;  Laterality: Right;   TIBIA IM NAIL INSERTION Right 11/01/2017   Procedure: INTRAMEDULLARY (IM) NAIL TIBIAL;  Surgeon: Roby Lofts, MD;  Location: [facility] OR;  Service: Orthopedics;  Laterality: Right;     A IV Location/Drains/Wounds Patient Lines/Drains/Airways Status     Active Line/Drains/Airways     Name Placement date Placement time Site Days   Peripheral IV 10/01/22 20 G Posterior;Right Hand 10/01/22  0827  Hand  less than 1   Peripheral IV 10/01/22 20 G Posterior;Proximal;Right Forearm 10/01/22  0845  Forearm  less than 1            Intake/Output Last 24 hours No intake or output  data in the 24 hours ending 10/01/22 1212  Labs/Imaging Results for orders placed or performed during the hospital encounter of 10/01/22 (from the past 48 hour(s))  CBG monitoring, ED     Status: Abnormal   Collection Time: 10/01/22  8:23 AM  Result Value Ref Range   Glucose-Capillary 52 (L) 70 - 99 mg/dL    Comment: Glucose reference range applies only to samples taken after fasting for at least 8 hours.   Comment 1 Notify RN    Comment 2 Document in Chart   Comprehensive metabolic panel     Status: Abnormal   Collection Time:  10/01/22  8:44 AM  Result Value Ref Range   Sodium 138 135 - 145 mmol/L   Potassium 3.9 3.5 - 5.1 mmol/L   Chloride 103 98 - 111 mmol/L   CO2 22 22 - 32 mmol/L   Glucose, Bld 211 (H) 70 - 99 mg/dL    Comment: Glucose reference range applies only to samples taken after fasting for at least 8 hours.   BUN 12 6 - 20 mg/dL   Creatinine, Ser 5.36 (H) 0.61 - 1.24 mg/dL   Calcium 64.4 8.9 - 03.4 mg/dL   Total Protein 7.8 6.5 - 8.1 g/dL   Albumin 4.4 3.5 - 5.0 g/dL   AST 25 15 - 41 U/L   ALT 23 0 - 44 U/L   Alkaline Phosphatase 66 38 - 126 U/L   Total Bilirubin 0.9 0.3 - 1.2 mg/dL   GFR, Estimated >74 >25 mL/min    Comment: (NOTE) Calculated using the CKD-EPI Creatinine Equation (2021)    Anion gap 13 5 - 15    Comment: Performed at [facility], [address], [place], [place] [address]  CBC     Status: None   Collection Time: 10/01/22  8:44 AM  Result Value Ref Range   WBC 8.2 4.0 - 10.5 K/uL   RBC 5.20 4.22 - 5.81 MIL/uL   Hemoglobin 15.7 13.0 - 17.0 g/dL   HCT 75.6 43.3 - 29.5 %   MCV 89.8 80.0 - 100.0 fL   MCH 30.2 26.0 - 34.0 pg   MCHC 33.6 30.0 - 36.0 g/dL   RDW 18.8 41.6 - 60.6 %   Platelets 284 150 - 400 K/uL   nRBC 0.0 0.0 - 0.2 %    Comment: Performed at [facility], [address], [place], [place] [address]  Troponin I (High Sensitivity)     Status: None   Collection Time: 10/01/22  8:44 AM  Result Value Ref Range   Troponin I (High Sensitivity) 5 <18 ng/L    Comment: (NOTE) Elevated high sensitivity troponin I (hsTnI) values and significant  changes across serial measurements may suggest ACS but many other  chronic and acute conditions are known to elevate hsTnI results.  Refer to the "Links" section for chest pain algorithms and additional  guidance. Performed at [facility], [address], [place], [place] [address]   Ethanol     Status: None   Collection Time: 10/01/22  8:45 AM  Result Value Ref Range   Alcohol, Ethyl (B) <10 <10  mg/dL    Comment: (NOTE) Lowest detectable limit for serum alcohol is 10 mg/dL.  For medical purposes only. Performed at [facility], [address] [place], [place] [address]   SARS Coronavirus 2 by RT PCR (hospital order, performed in [facility] hospital lab) *cepheid single result test* Anterior Nasal Swab  Status: None   Collection Time: 10/01/22  8:51 AM   Specimen: Anterior Nasal Swab  Result Value Ref Range   SARS Coronavirus 2 by RT PCR NEGATIVE NEGATIVE    Comment: Performed at [facility], [address], [place], [place] [address]  I-Stat CG4 Lactic Acid     Status: None   Collection Time: 10/01/22  8:58 AM  Result Value Ref Range   Lactic Acid, Venous 1.3 0.5 - 1.9 mmol/L  CBG monitoring, ED (now and then every hour for 3 hours)     Status: Abnormal   Collection Time: 10/01/22  9:06 AM  Result Value Ref Range   Glucose-Capillary 52 (L) 70 - 99 mg/dL    Comment: Glucose reference range applies only to samples taken after fasting for at least 8 hours.  CBG monitoring, ED (now and then every hour for 3 hours)     Status: None   Collection Time: 10/01/22 10:17 AM  Result Value Ref Range   Glucose-Capillary 85 70 - 99 mg/dL    Comment: Glucose reference range applies only to samples taken after fasting for at least 8 hours.   Comment 1 Notify RN    Comment 2 Document in Chart   CBG monitoring, ED (now and then every hour for 3 hours)     Status: None   Collection Time: 10/01/22 11:19 AM  Result Value Ref Range   Glucose-Capillary 82 70 - 99 mg/dL    Comment: Glucose reference range applies only to samples taken after fasting for at least 8 hours.   DG Chest Port 1 View  Result Date: 10/01/2022 CLINICAL DATA:  Altered mental status EXAM: PORTABLE CHEST 1 VIEW COMPARISON:  03/21/2022 FINDINGS: Artifact from EKG leads. Normal heart size and mediastinal contours. No acute infiltrate or edema. Nipple shadow seen on the left, also seen on priors. No effusion  or pneumothorax. No acute osseous findings. IMPRESSION: No active disease. Electronically Signed   By: Tiburcio Pea M.D.   On: 10/01/2022 09:18    Pending Labs Unresulted Labs (From admission, onward)     Start     Ordered   10/01/22 0843  Blood culture (routine x 2)  BLOOD CULTURE X 2,   R (with STAT occurrences),   Status:  Canceled      10/01/22 0842   10/01/22 0842  Urinalysis, Routine w reflex microscopic -Urine, Clean Catch  Once,   URGENT       Question:  Specimen Source  Answer:  Urine, Clean Catch   10/01/22 0842   Signed and Held  CBC  (enoxaparin (LOVENOX)    CrCl >/= 30 ml/min)  Once,   R       Comments: Baseline for enoxaparin therapy IF NOT ALREADY DRAWN.  Notify MD if PLT < 100 K.    Signed and Held   Signed and Held  Creatinine, serum  (enoxaparin (LOVENOX)    CrCl >/= 30 ml/min)  Once,   R       Comments: Baseline for enoxaparin therapy IF NOT ALREADY DRAWN.    Signed and Held   Signed and Held  Creatinine, serum  (enoxaparin (LOVENOX)    CrCl >/= 30 ml/min)  Weekly,   R     Comments: while on enoxaparin therapy    Signed and Held   Signed and Held  Basic metabolic panel  Tomorrow morning,   R        Signed and Held   Signed  and Held  CBC  Tomorrow morning,   R        Signed and Held            Vitals/Pain Today's Vitals   10/01/22 1020 10/01/22 1100 10/01/22 1130 10/01/22 1145  BP:  (!) 130/93 (!) 146/91 (!) 141/94  Pulse:  79 81 79  Resp:  15 15 19   Temp: (!) 94.6 F (34.8 C)     TempSrc: Oral     SpO2:  99% 100% 99%    Isolation Precautions No active isolations  Medications Medications  sodium chloride flush (NS) 0.9 % injection 3 mL (3 mLs Intravenous Not Given 10/01/22 0901)  sodium chloride flush (NS) 0.9 % injection 3 mL (has no administration in time range)  0.9 %  sodium chloride infusion (has no administration in time range)  dextrose 10 % infusion ( Intravenous New Bag/Given 10/01/22 0923)  dextrose 50 % solution 50 mL (50 mLs  Intravenous Given 10/01/22 0834)    Mobility walks     Focused Assessments hypoglycemia   R Recommendations: See Admitting Provider Note  Report given to:   Additional Notes: Pt has eaten, he is now more alert.

## 2022-10-01 NOTE — ED Notes (Signed)
Bair Hugger placed.

## 2022-10-01 NOTE — Assessment & Plan Note (Signed)
Continue blood pressure control with hydralazine, valsartan and hydrochlorothiazide.

## 2022-10-01 NOTE — Assessment & Plan Note (Signed)
Baseline serum cr is 1,4  Plan to continue close follow up renal function and electrolytes. For now will continue blood pressure control with ARB and hydrochlorothiazide.

## 2022-10-01 NOTE — H&P (Addendum)
History and Physical    Patient: [name] ONG:[id number] DOB: [birth year] DOA: 10/01/2022 DOS: the patient was seen and examined on 10/01/2022 PCP: Storm Frisk, MD  Patient coming from: Home  Chief Complaint:  Chief Complaint  Patient presents with   Hypoglycemia   HPI: Christapher Cuong Moorman is a 37 y.o. male with medical history significant of asthma, depression, HIV, and T1DM, who presented with altered mental status.   Patient was brought to the ED by EMS due to lethargy and hypoglycemia. His capillary glucose was found down to 25 mg/dl, he received 2 amps of D50, going only up to 52 mg/dl. He was transported to the ED.   He does not recall the exact details of about what happened this morning. Apparently he was found confused and crawling on the ground at his workplace, he is a security guard.  He endorsed having low blood glucose over the las few days, and he has been using only basal insulin and not short acting insulin. He had to reduce his dose from 15 to 10 units over last few days.  Today he went to work after getting 10 units of long acting insulin, he did not have breakfast.    He denies any other relevant changes in his health or medications over last few days. He continue to be somnolent at the time of my examination, making history limited. No family at the bedside.   In the Ed he continue to have hypoglycemia and was placed on D10 infusion.    Review of Systems: As mentioned in the history of present illness. All other systems reviewed and are negative. Past Medical History:  Diagnosis Date   Anxiety    Asthma    Closed displaced comminuted fracture of shaft of right tibia 11/01/2017   Closed fracture of base of fifth metatarsal bone of right foot 11/13/2017   Closed fracture of medial malleolus of right ankle 10/31/2017   Depression    Headache    "only w/stress" (11/01/2017)   HIV (human immunodeficiency virus infection) (HCC)    Hypoglycemia due  to insulin 06/27/2022   Migraine    "a few/month" (11/01/2017)   Refusal of blood transfusions as patient is Jehovah's Witness    Seizure (HCC)    "only w/my low blood sugars"  (11/01/2017)   Type I diabetes mellitus (HCC)    Past Surgical History:  Procedure Laterality Date   FRACTURE SURGERY     IM NAILING TIBIA Right 11/01/2017   INTRAMEDULLARY (IM) NAIL TIBIALRightGeneral   ORIF ANKLE FRACTURE Right 11/01/2017   ORIF ANKLE FRACTURE Right 11/01/2017   Procedure: OPEN REDUCTION INTERNAL FIXATION (ORIF) ANKLE FRACTURE;  Surgeon: Roby Lofts, MD;  Location: [facility] OR;  Service: Orthopedics;  Laterality: Right;   TIBIA IM NAIL INSERTION Right 11/01/2017   Procedure: INTRAMEDULLARY (IM) NAIL TIBIAL;  Surgeon: Roby Lofts, MD;  Location: [facility] OR;  Service: Orthopedics;  Laterality: Right;   Social History:  reports that he has been smoking cigars. He started smoking about 7 years ago. He has never used smokeless tobacco. He reports that he does not currently use alcohol after a past usage of about 7.0 standard drinks of alcohol per week. He reports current drug use. Frequency: 2.00 times per week. Drug: Marijuana.  Allergies  Allergen Reactions   Shellfish Allergy Anaphylaxis and Hives   Aspirin Other (See Comments)    Lump in armpit    Family History  Problem Relation Age of Onset  Thyroid disease Mother    Diabetes Mother    Breast cancer Maternal Grandmother    Diabetes Maternal Grandmother     Prior to Admission medications   Medication Sig Start Date End Date Taking? Authorizing Provider  acetaminophen (TYLENOL) 500 MG tablet Take 1 tablet (500 mg total) by mouth every 6 (six) hours as needed. 07/13/22  Yes Redwine, Madison A, PA-C  elvitegravir-cobicistat-emtricitabine-tenofovir (GENVOYA) 150-150-200-10 MG TABS tablet Take 1 tablet by mouth daily with breakfast. 09/14/22  Yes Veryl Speak, FNP  hydrALAZINE (APRESOLINE) 25 MG tablet Take 1 tablet (25 mg total) by mouth every 8  (eight) hours. For hypertension 08/16/22  Yes Storm Frisk, MD  insulin glargine-yfgn (SEMGLEE, YFGN,) 100 UNIT/ML Pen Inject 16 Units into the skin daily. 08/16/22  Yes Storm Frisk, MD  insulin lispro (HUMALOG) 100 UNIT/ML KwikPen Inject 1-6 Units into the skin as directed. ONLY with food - 1 unit per 15 grams of carbohydrates (bread, pasta, fruit, etc) 08/16/22  Yes Storm Frisk, MD  sertraline (ZOLOFT) 100 MG tablet Take 1.5 tablets (150 mg total) by mouth daily. 08/16/22 12/14/22 Yes Storm Frisk, MD  valsartan-hydrochlorothiazide (DIOVAN-HCT) 320-25 MG tablet Take 1 tablet by mouth daily. 08/16/22  Yes Storm Frisk, MD  Accu-Chek Softclix Lancets lancets Use to check blood sugar three times daily. 08/16/22   Storm Frisk, MD  blood glucose meter kit and supplies Dispense based on patient and insurance preference. Use up to four times daily as directed: For blood sugar monitoring 10/08/20   Armandina Stammer I, NP  Blood Glucose Monitoring Suppl (ACCU-CHEK GUIDE) w/Device KIT Use to check blood sugar 3 times daily. 08/16/22   Storm Frisk, MD  Continuous Glucose Receiver (DEXCOM G7 RECEIVER) DEVI Use to check blood sugar continuously 08/16/22   Storm Frisk, MD  Continuous Glucose Sensor (DEXCOM G7 SENSOR) MISC Use to check blood sugar continuously. Change sensors once every 10 days. 08/16/22   Storm Frisk, MD  glucose blood (ACCU-CHEK GUIDE) test strip Use to check blood sugar 3 times daily. 08/16/22   Storm Frisk, MD  ibuprofen (ADVIL) 600 MG tablet Take 1 tablet (600 mg total) by mouth every 6 (six) hours as needed. Patient not taking: Reported on 10/01/2022 07/13/22   Redwine, Madison A, PA-C  Insulin Pen Needle 31G X 5 MM MISC Use with insulin pen 08/16/22   Storm Frisk, MD  pravastatin (PRAVACHOL) 20 MG tablet Take 1 tablet (20 mg total) by mouth daily. Patient not taking: Reported on 03/31/2019 03/08/19 07/08/19  Claiborne Rigg, NP    Physical  Exam: Vitals:   10/01/22 1145 10/01/22 1245 10/01/22 1318 10/01/22 1356  BP: (!) 141/94 134/78  (!) 151/107  Pulse: 79 73  86  Resp: 19 17  12   Temp:   98.1 F (36.7 C) 98.2 F (36.8 C)  TempSrc:   Oral Oral  SpO2: 99% 100%  100%   Neurology somnolent but able to respond to respond to questions, and follow commands. ENT with mild pallor Cardiovascular with S1 and S2 present and rhythmic with no gallops, rubs or murmurs Respiratory with no rales or wheezing, no rhonchi Abdomen with no distention  No lower extremity edema  Data Reviewed:   Na 138, K 3,9 Cl 103 bicarbonate 22, glucose 211, bun 12, cr 1,31 anion gap 10.  High sensitive troponin 5 and 4  Lactic acid 1,3 Wbc 8,2 hgb 15,7 plt 284  Sars covid 19 negative  Urina analysis SG 1,015, protein >300, glucose 50, negative leukocytes and negative hgb.  Alcohol < 10   Chest radiograph with right rotation with no infiltrates or effusions.   EKG 58 bpm, normal axis, normal intervals, sinus rhythm with no significant ST segment or T wave changes.   Assessment and Plan: * Diabetes mellitus type 1 (HCC) Uncontrolled T1DM with HgA1c 8.8 in 07/2022.   Last hospitalization 05/2022 for hypoglycemia, then his dose of insulin was reduced.   Capillary glucose is currently 126 mg/dl.   Plan to continue IV dextrose 5% with LR at 100 ml hr.  Considering patient type 1 DM he is in risk for DKA.  Capillary glucose monitoring q 2 hrs for today. When glucose 250 mg/dl will resume basal insulin, likely 5 units, as long patient is tolerating po well.  Check BMP at 21:00 if sings of DKA, will need insulin drip per protocol.   Essential hypertension Continue blood pressure control with hydralazine, valsartan and hydrochlorothiazide.   CKD (chronic kidney disease) stage 2, GFR 60-89 ml/min Baseline serum cr is 1,4  Plan to continue close follow up renal function and electrolytes. For now will continue blood pressure control with ARB  and hydrochlorothiazide.   HIV (human immunodeficiency virus infection) (HCC) Resume antiretroviral therapy.   Patient has history of syphilis that has been treated in the past.   Asthma No signs of acute exacerbation.     Advance Care Planning: patient confirmed code status to be DNR.   Consults: none   Family Communication: none   Severity of Illness: The appropriate patient status for this patient is OBSERVATION. Observation status is judged to be reasonable and necessary in order to provide the required intensity of service to ensure the patient's safety. The patient's presenting symptoms, physical exam findings, and initial radiographic and laboratory data in the context of their medical condition is felt to place them at decreased risk for further clinical deterioration. Furthermore, it is anticipated that the patient will be medically stable for discharge from the hospital within 2 midnights of admission.   Author: Coralie Keens, MD 10/01/2022 5:30 PM  For on call review [email or web address].

## 2022-10-01 NOTE — ED Notes (Signed)
PT Awake and alert eating a meal

## 2022-10-01 NOTE — ED Notes (Signed)
Pt CBG was 85

## 2022-10-01 NOTE — Assessment & Plan Note (Signed)
Old records personally reviewed, neurology follow up from 11/2020. Seropositive ocular myasthenia gravis, no significant bulbar, or limb weakness noted.  He is not longer on Mestinon or prednisone, never treated with long term steroids sparing agent.   

## 2022-10-01 NOTE — ED Triage Notes (Signed)
Pt arrives via EMS from work. EMS was called due to patient being lethargic. Upon ems arrival pt had a BG of 25. EMS administered D10 but BG quickly dropped to 35 after. EMS gave an additional dose of D10 which brought him to 155. PT arrives with a BG of 52. Pt is AxO, however, very lethargic.

## 2022-10-01 NOTE — Assessment & Plan Note (Signed)
Resume antiretroviral therapy.   Patient has history of syphilis that has been treated in the past.

## 2022-10-01 NOTE — Plan of Care (Signed)
  Problem: Education: Goal: Knowledge of General Education information will improve Description: Including pain rating scale, medication(s)/side effects and non-pharmacologic comfort measures 10/01/2022 1823 by Galen Manila, RN Outcome: Progressing 10/01/2022 1823 by Galen Manila, RN Outcome: Progressing   Problem: Health Behavior/Discharge Planning: Goal: Ability to manage health-related needs will improve 10/01/2022 1823 by Galen Manila, RN Outcome: Progressing 10/01/2022 1823 by Galen Manila, RN Outcome: Progressing   Problem: Clinical Measurements: Goal: Ability to maintain clinical measurements within normal limits will improve 10/01/2022 1823 by Galen Manila, RN Outcome: Progressing 10/01/2022 1823 by Galen Manila, RN Outcome: Progressing Goal: Will remain free from infection 10/01/2022 1823 by Galen Manila, RN Outcome: Progressing 10/01/2022 1823 by Galen Manila, RN Outcome: Progressing Goal: Diagnostic test results will improve 10/01/2022 1823 by Galen Manila, RN Outcome: Progressing 10/01/2022 1823 by Galen Manila, RN Outcome: Progressing Goal: Respiratory complications will improve 10/01/2022 1823 by Galen Manila, RN Outcome: Progressing 10/01/2022 1823 by Galen Manila, RN Outcome: Progressing Goal: Cardiovascular complication will be avoided Outcome: Progressing   Problem: Activity: Goal: Risk for activity intolerance will decrease Outcome: Progressing   Problem: Nutrition: Goal: Adequate nutrition will be maintained Outcome: Progressing   Problem: Coping: Goal: Level of anxiety will decrease Outcome: Progressing   Problem: Elimination: Goal: Will not experience complications related to bowel motility Outcome: Progressing Goal: Will not experience complications related to urinary retention Outcome: Progressing   Problem: Pain Managment: Goal: General experience of  comfort will improve Outcome: Progressing   Problem: Safety: Goal: Ability to remain free from injury will improve Outcome: Progressing   Problem: Skin Integrity: Goal: Risk for impaired skin integrity will decrease Outcome: Progressing

## 2022-10-01 NOTE — ED Notes (Signed)
Pt was able to tolerate 2 cups of juice

## 2022-10-02 DIAGNOSIS — E1021 Type 1 diabetes mellitus with diabetic nephropathy: Secondary | ICD-10-CM

## 2022-10-02 LAB — BASIC METABOLIC PANEL WITH GFR
Anion gap: 10 (ref 5–15)
BUN: 15 mg/dL (ref 6–20)
CO2: 23 mmol/L (ref 22–32)
Calcium: 9.2 mg/dL (ref 8.9–10.3)
Chloride: 99 mmol/L (ref 98–111)
Creatinine, Ser: 1.4 mg/dL — ABNORMAL HIGH (ref 0.61–1.24)
GFR, Estimated: >60 mL/min (ref >60)
Glucose, Bld: 271 mg/dL — ABNORMAL HIGH (ref 70–99)
Potassium: 4.1 mmol/L (ref 3.5–5.1)
Sodium: 132 mmol/L — ABNORMAL LOW (ref 135–145)

## 2022-10-02 LAB — HEMOGLOBIN A1C
Hgb A1c MFr Bld: 8.5 % — ABNORMAL HIGH (ref 4.8–5.6)
Mean Plasma Glucose: 197.25 mg/dL

## 2022-10-02 LAB — CBC
HCT: 40.8 % (ref 39.0–52.0)
Hemoglobin: 14 g/dL (ref 13.0–17.0)
MCH: 30.2 pg (ref 26.0–34.0)
MCHC: 34.3 g/dL (ref 30.0–36.0)
MCV: 88.1 fL (ref 80.0–100.0)
Platelets: 302 10*3/uL (ref 150–400)
RBC: 4.63 MIL/uL (ref 4.22–5.81)
RDW: 13.6 % (ref 11.5–15.5)
WBC: 8 10*3/uL (ref 4.0–10.5)
nRBC: 0 % (ref 0.0–0.2)

## 2022-10-02 LAB — GLUCOSE, CAPILLARY
Glucose-Capillary: 220 mg/dL — ABNORMAL HIGH (ref 70–99)
Glucose-Capillary: 247 mg/dL — ABNORMAL HIGH (ref 70–99)
Glucose-Capillary: 257 mg/dL — ABNORMAL HIGH (ref 70–99)
Glucose-Capillary: 271 mg/dL — ABNORMAL HIGH (ref 70–99)
Glucose-Capillary: 74 mg/dL (ref 70–99)

## 2022-10-02 MED ORDER — INSULIN ASPART 100 UNIT/ML IJ SOLN
0.0000 [IU] | Freq: Three times a day (TID) | INTRAMUSCULAR | Status: DC
  Administered 2022-10-02: 3 [IU] via SUBCUTANEOUS

## 2022-10-02 MED ORDER — INSULIN ASPART 100 UNIT/ML IJ SOLN: 0.0000 [IU] | Freq: Every day | INTRAMUSCULAR | Status: DC

## 2022-10-02 MED ORDER — INSULIN GLARGINE-YFGN 100 UNIT/ML ~~LOC~~ SOLN
5.0000 [IU] | Freq: Every day | SUBCUTANEOUS | Status: DC
  Administered 2022-10-02: 5 [IU] via SUBCUTANEOUS
  Filled 2022-10-02: qty 0.05

## 2022-10-02 MED ORDER — INSULIN GLARGINE-YFGN 100 UNIT/ML ~~LOC~~ SOPN
10.0000 [IU] | PEN_INJECTOR | Freq: Every day | SUBCUTANEOUS | Status: DC
Start: 2022-10-02 — End: 2022-10-20

## 2022-10-02 MED ORDER — INSULIN GLARGINE-YFGN 100 UNIT/ML ~~LOC~~ SOPN
12.0000 [IU] | PEN_INJECTOR | Freq: Every day | SUBCUTANEOUS | Status: DC
Start: 2022-10-02 — End: 2022-10-02

## 2022-10-02 NOTE — Social Work (Signed)
CSW notes patient is living in a homeless shelter and unfortunately at this time there is significant barriers to securing housing in the medical setting. CSW has provided patient with information on resources to follow-up with in the community once they discharge. CSW notes patient arrived to the hospital with no current residence and at this time they are medically cleared they are appropriate to discharge back to their current living situation. CSW gave buss pass to get back to his car.   Jimmy Picket, LCSW Clinical Social Worker

## 2022-10-02 NOTE — Discharge Summary (Signed)
Eaden Raahil Ong JYN:[id number] DOB: 1985/12/04 DOA: 10/01/2022  PCP: Storm Frisk, MD  Admit date: 10/01/2022  Discharge date: 10/02/2022  Admitted From: Home   Disposition:  Home   Recommendations for Outpatient Follow-up:   Follow up with PCP in 1-2 weeks  PCP Please obtain BMP/CBC, 2 view CXR in 1week,  (see Discharge instructions)   PCP Please follow up on the following pending results: Monitor glycemic control closely   Home Health: None Equipment/Devices: None Consultations: None  Discharge Condition: Stable    CODE STATUS: Full    Diet Recommendation: Heart Healthy Low Carb    Chief Complaint  Patient presents with   Hypoglycemia     Brief history of present illness from the day of admission and additional interim summary    37 y.o. male with medical history significant of asthma, depression, HIV, and T1DM, who presented with altered mental status.    Patient was brought to the ED by EMS due to lethargy and hypoglycemia. His capillary glucose was found down to 25 mg/dl, he received 2 amps of D50, going only up to 52 mg/dl. He was transported to the ED. Apparently he has not been consuming his meals properly and still taking his long-acting insulin, has been running low blood sugars off-and-on for the last several days.                                                                    Hospital Course   DM type I complicated by both hyper and hypoglycemia.  Not compliant with taking meals at regular times of the day, still taking long-acting insulin, also not taking his short acting insulins, counseled on compliance, long-acting insulin dose adjusted and requested not to take it if he is skipping his meals, strictly counseled to check CBGs q. [facility] maintain a logbook and follow-up with PCP  within a week, to check with PCP if further adjustments are needed.  He is this morning symptom-free and wants to go home.  Lab Results  Component Value Date   HGBA1C 8.5 (H) 10/02/2022   CBG (last 3)  Recent Labs    10/02/22 0224 10/02/22 0425 10/02/22 0556  GLUCAP 220* 257* 247*    Essential hypertension.  Continue home medications.  CKD stage IIIa.  Baseline creatinine around 1.4.  At baseline PCP to monitor.  History of HIV.  Continue home antiretroviral regimen.  He also has history of syphilis which has been treated in the past.  Asthma.  No acute issues.  Discharge diagnosis     Principal Problem:   Diabetes mellitus type 1 (HCC) Active Problems:   Essential hypertension   CKD (chronic kidney disease) stage 2, GFR 60-89 ml/min   HIV (human immunodeficiency virus infection) (HCC)   Asthma  Discharge instructions    Discharge Instructions     Discharge instructions   Complete by: As directed    Follow with Primary MD Storm Frisk, MD in 7 days   Get CBC, CMP, 2 view Chest X ray -  checked next visit with your primary MD    Activity: As tolerated with Full fall precautions use walker/cane & assistance as needed  Disposition Home   Diet: Carb modified diet.  If you are skipping your meals then hold your long-acting insulin.  Accuchecks 4 times/day, Once in AM empty stomach and then before each meal. Log in all results and show them to your Prim.MD in 3 days. If any glucose reading is under 80 or above 300 call your Prim MD immidiately. Follow Low glucose instructions for glucose under 80 as instructed.   Special Instructions: If you have smoked or chewed Tobacco  in the last 2 yrs please stop smoking, stop any regular Alcohol  and or any Recreational drug use.  On your next visit with your primary care physician please Get Medicines reviewed and adjusted.  Please request your Prim.MD to go over all Hospital Tests and Procedure/Radiological  results at the follow up, please get all Hospital records sent to your Prim MD by signing hospital release before you go home.  If you experience worsening of your admission symptoms, develop shortness of breath, life threatening emergency, suicidal or homicidal thoughts you must seek medical attention immediately by calling 911 or calling your MD immediately  if symptoms less severe.  You Must read complete instructions/literature along with all the possible adverse reactions/side effects for all the Medicines you take and that have been prescribed to you. Take any new Medicines after you have completely understood and accpet all the possible adverse reactions/side effects.   Increase activity slowly   Complete by: As directed        Discharge Medications   Allergies as of 10/02/2022       Reactions   Shellfish Allergy Anaphylaxis, Hives   Aspirin Other (See Comments)   Lump in armpit        Medication List     STOP taking these medications    ibuprofen 600 MG tablet Commonly known as: ADVIL       TAKE these medications    Accu-Chek Guide test strip Generic drug: glucose blood Use to check blood sugar 3 times daily.   Accu-Chek Guide w/Device Kit Use to check blood sugar 3 times daily.   Accu-Chek Softclix Lancets lancets Use to check blood sugar three times daily.   acetaminophen 500 MG tablet Commonly known as: TYLENOL Take 1 tablet (500 mg total) by mouth every 6 (six) hours as needed.   blood glucose meter kit and supplies Dispense based on patient and insurance preference. Use up to four times daily as directed: For blood sugar monitoring   Dexcom G7 Receiver Devi Use to check blood sugar continuously   Dexcom G7 Sensor Misc Use to check blood sugar continuously. Change sensors once every 10 days.   Genvoya 150-150-200-10 MG Tabs tablet Generic drug: elvitegravir-cobicistat-emtricitabine-tenofovir Take 1 tablet by mouth daily with breakfast.    HumaLOG KwikPen 100 UNIT/ML KwikPen Generic drug: insulin lispro Inject 1-6 Units into the skin as directed. ONLY with food - 1 unit per 15 grams of carbohydrates (bread, pasta, fruit, etc)   hydrALAZINE 25 MG tablet Commonly known as: APRESOLINE Take 1 tablet (25 mg total) by mouth every 8 (eight) hours. For hypertension  insulin glargine-yfgn 100 UNIT/ML Pen Commonly known as: Semglee (yfgn) Inject 10 Units into the skin daily. Please take your regular meals if you are taking this medication, if you are skipping meals then hold this medication. What changed:  how much to take additional instructions   sertraline 100 MG tablet Commonly known as: ZOLOFT Take 1.5 tablets (150 mg total) by mouth daily.   TRUEplus 5-Bevel Pen Needles 31G X 5 MM Misc Generic drug: Insulin Pen Needle Use with insulin pen   valsartan-hydrochlorothiazide 320-25 MG tablet Commonly known as: DIOVAN-HCT Take 1 tablet by mouth daily.         Follow-up Information     Storm Frisk, MD. Schedule an appointment as soon as possible for a visit in 1 week(s).   Specialty: Pulmonary Disease Contact information: [address] [place] [address] [phone]                 Major procedures and Radiology Reports - PLEASE review detailed and final reports thoroughly  -        DG Chest Port 1 View  Result Date: 10/01/2022 CLINICAL DATA:  Altered mental status EXAM: PORTABLE CHEST 1 VIEW COMPARISON:  03/21/2022 FINDINGS: Artifact from EKG leads. Normal heart size and mediastinal contours. No acute infiltrate or edema. Nipple shadow seen on the left, also seen on priors. No effusion or pneumothorax. No acute osseous findings. IMPRESSION: No active disease. Electronically Signed   By: Tiburcio Pea M.D.   On: 10/01/2022 09:18    Micro Results    Recent Results (from the past 240 hour(s))  Blood culture (routine x 2)     Status: None (Preliminary result)   Collection  Time: 10/01/22  8:43 AM   Specimen: BLOOD RIGHT ARM  Result Value Ref Range Status   Specimen Description BLOOD RIGHT ARM  Final   Special Requests   Final    BOTTLES DRAWN AEROBIC AND ANAEROBIC Blood Culture adequate volume   Culture   Final    NO GROWTH < 24 HOURS Performed at [facility], [address] [place], [place] [address]    Report Status PENDING  Incomplete  SARS Coronavirus 2 by RT PCR (hospital order, performed in [facility] hospital lab) *cepheid single result test* Anterior Nasal Swab     Status: None   Collection Time: 10/01/22  8:51 AM   Specimen: Anterior Nasal Swab  Result Value Ref Range Status   SARS Coronavirus 2 by RT PCR NEGATIVE NEGATIVE Final    Comment: Performed at [facility], [address], [place], [place] [address]    Today   Subjective    Nikola Marone today has no headache,no chest abdominal pain,no new weakness tingling or numbness, feels much better wants to go home today.    Objective   Blood pressure (!) 147/86, pulse 74, temperature 98.2 F (36.8 C), temperature source Oral, resp. rate 18, SpO2 98%.   Intake/Output Summary (Last 24 hours) at 10/02/2022 0811 Last data filed at 10/02/2022 0230 Gross per 24 hour  Intake 480 ml  Output 1800 ml  Net -1320 ml    Exam  Awake Alert, No new F.N deficits,    [place].AT,PERRAL Supple Neck,   Symmetrical Chest wall movement, Good air movement bilaterally, CTAB RRR,No Gallops,   +ve B.Sounds, Abd Soft, Non tender,  No Cyanosis, Clubbing or edema    Data Review   Recent Labs  Lab 10/01/22 0844 10/01/22 1416 10/02/22  0311  WBC 8.2 8.4 8.0  HGB 15.7 14.8 14.0  HCT 46.7 42.7 40.8  PLT 284 289 302  MCV 89.8 88.0 88.1  MCH 30.2 30.5 30.2  MCHC 33.6 34.7 34.3  RDW 13.9 13.6 13.6    Recent Labs  Lab 10/01/22 0844 10/01/22 0858 10/01/22 1416 10/01/22 2116 10/02/22 0311  NA 138  --   --  137 132*  K 3.9  --   --  3.8 4.1  CL 103  --   --  99 99  CO2 22  --   --  24  23  ANIONGAP 13  --   --  14 10  GLUCOSE 211*  --   --  139* 271*  BUN 12  --   --  16 15  CREATININE 1.31*  --  1.25* 1.26* 1.40*  AST 25  --   --   --   --   ALT 23  --   --   --   --   ALKPHOS 66  --   --   --   --   BILITOT 0.9  --   --   --   --   ALBUMIN 4.4  --   --   --   --   LATICACIDVEN  --  1.3  --   --   --   HGBA1C  --   --   --   --  8.5*  CALCIUM 10.1  --   --  9.7 9.2    Total Time in preparing paper work, data evaluation and todays exam - 35 minutes  Signature  -    Susa Raring M.D on 10/02/2022 at 8:11 AM   -  To page go to [email or web address]

## 2022-10-02 NOTE — Discharge Instructions (Addendum)
Follow with Primary MD Storm Frisk, MD in 7 days   Get CBC, CMP, 2 view Chest X ray -  checked next visit with your primary MD    Activity: As tolerated with Full fall precautions use walker/cane & assistance as needed  Disposition Home   Diet: Carb modified diet.  If you are skipping your meals then hold your long-acting insulin.  Accuchecks 4 times/day, Once in AM empty stomach and then before each meal. Log in all results and show them to your Prim.MD in 3 days. If any glucose reading is under 80 or above 300 call your Prim MD immidiately. Follow Low glucose instructions for glucose under 80 as instructed.   Special Instructions: If you have smoked or chewed Tobacco  in the last 2 yrs please stop smoking, stop any regular Alcohol  and or any Recreational drug use.  On your next visit with your primary care physician please Get Medicines reviewed and adjusted.  Please request your Prim.MD to go over all Hospital Tests and Procedure/Radiological results at the follow up, please get all Hospital records sent to your Prim MD by signing hospital release before you go home.  If you experience worsening of your admission symptoms, develop shortness of breath, life threatening emergency, suicidal or homicidal thoughts you must seek medical attention immediately by calling 911 or calling your MD immediately  if symptoms less severe.  You Must read complete instructions/literature along with all the possible adverse reactions/side effects for all the Medicines you take and that have been prescribed to you. Take any new Medicines after you have completely understood and accpet all the possible adverse reactions/side effects.     DAY [facility]) M-F 8am-3pm   [address] [place], [place] [address]   [phone] Services include: laundry, barbering, support groups, case management, phone  & computer access, showers, AA/NA mtgs, mental health/substance abuse  nurse, job skills class, disability information, VA assistance, spiritual classes, etc.   [facility] [address] [place], [place]   [phone] Provides breakfast each weekday morning except Wednesdays, and an evening community meal every Friday. Access to showers is available during breakfast hours and telephones for seeking work are also provided. Also offers job referral and counseling for the homeless and unemployed.  HOMELESS SHELTERS [facility]   [employer] [phone] [address]     [facility] [address] [place], [place] [address]     [address] [place]  [phone]      [phone]  [facility]   [facility] [address] [place]    [address] [place] [address]     [place], [place] [address] [phone]       [phone]  [facility] (women only) [address] [place], [place] [address] [phone]  [facility]: Marcy [facility] (overflow shelter: [address] [place], [place] check in at 6pm for placement in local shelter).  [address] [place], [place], [place] [address] Phone: [phone]  Crisis Services [facility]     [facility]   Crisis Services      [facility] [phone]. [address]     [address] [place], [place] [address]     [place], [place] [address]    [facility] - [phone]

## 2022-10-04 ENCOUNTER — Telehealth: Payer: Self-pay

## 2022-10-04 NOTE — Transitions of Care (Post Inpatient/ED Visit) (Signed)
   10/04/2022  Name: [name] MRN: [id number] DOB: Aug 28, 1985  Today's TOC FU Call Status: Today's TOC FU Call Status:: Successful TOC FU Call Completed TOC FU Call Complete Date: 10/04/22  Transition Care Management Follow-up Telephone Call Date of Discharge: 10/02/22 Discharge Facility: [name]) Type of Discharge: Inpatient Admission Primary Inpatient Discharge Diagnosis:: hypoglycemia How have you been since you were released from the hospital?: Better Any questions or concerns?: No  Items Reviewed: Did you receive and understand the discharge instructions provided?: Yes Medications obtained,verified, and reconciled?: Partial Review Completed Reason for Partial Mediation Review: He said he has all medications and did not have any questions about the meds. He also stated that he has a glucometer Any new allergies since your discharge?: No Dietary orders reviewed?: No Do you have support at home?: Yes People in Home: facility resident Name of Support/Comfort Primary Source: staying at [employer].  Has been seen by the congregational nurses  Medications Reviewed Today: Medications Reviewed Today   Medications were not reviewed in this encounter     Home Care and Equipment/Supplies: Were Home Health Services Ordered?: No Any new equipment or medical supplies ordered?: No  Functional Questionnaire: Do you need assistance with bathing/showering or dressing?: No Do you need assistance with meal preparation?: No Do you need assistance with eating?: No Do you have difficulty maintaining continence: No Do you need assistance with getting out of bed/getting out of a chair/moving?: No Do you have difficulty managing or taking your medications?: No  Follow up appointments reviewed: PCP Follow-up appointment confirmed?: Yes Date of PCP follow-up appointment?: 10/13/22 Follow-up Provider: Dr Delford Field [facility] Follow-up appointment confirmed?:  Yes Date of Specialist follow-up appointment?: 12/27/22 Follow-Up Specialty Provider:: [facility] Do you need transportation to your follow-up appointment?: No (I instructed him to call the clinic if he needs a ride this his appointment.) Do you understand care options if your condition(s) worsen?: Yes-patient verbalized understanding    SIGNATURE.Robyne Peers, RN

## 2022-10-06 LAB — CULTURE, BLOOD (ROUTINE X 2)
Culture: NO GROWTH
Special Requests: ADEQUATE
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
## 2022-10-13 ENCOUNTER — Ambulatory Visit: Payer: No Typology Code available for payment source | Admitting: Critical Care Medicine

## 2022-10-20 ENCOUNTER — Other Ambulatory Visit: Payer: Self-pay | Admitting: Critical Care Medicine

## 2022-10-20 ENCOUNTER — Other Ambulatory Visit: Payer: Self-pay

## 2022-10-20 DIAGNOSIS — E1021 Type 1 diabetes mellitus with diabetic nephropathy: Secondary | ICD-10-CM

## 2022-10-20 MED ORDER — INSULIN GLARGINE-YFGN 100 UNIT/ML ~~LOC~~ SOPN
16.0000 [IU] | PEN_INJECTOR | Freq: Every day | SUBCUTANEOUS | 0 refills | Status: DC
  Filled 2022-10-20: qty 6, 37d supply, fill #0

## 2022-10-21 ENCOUNTER — Other Ambulatory Visit: Payer: Self-pay

## 2022-10-21 ENCOUNTER — Other Ambulatory Visit: Payer: Self-pay | Admitting: Pharmacist

## 2022-10-21 MED ORDER — LANTUS SOLOSTAR 100 UNIT/ML ~~LOC~~ SOPN
18.0000 [IU] | PEN_INJECTOR | Freq: Every day | SUBCUTANEOUS | 0 refills | Status: DC
  Filled 2022-10-21: qty 6, 28d supply, fill #0

## 2022-10-30 ENCOUNTER — Observation Stay (HOSPITAL_COMMUNITY)
Admission: EM | Admit: 2022-10-30 | Discharge: 2022-10-31 | Disposition: A | Discharge status: Home / Self Care | Payer: MEDICAID | Attending: Family Medicine | Admitting: Family Medicine

## 2022-10-30 ENCOUNTER — Other Ambulatory Visit: Payer: Self-pay

## 2022-10-30 DIAGNOSIS — N1831 Chronic kidney disease, stage 3a: Secondary | ICD-10-CM | POA: Insufficient documentation

## 2022-10-30 DIAGNOSIS — Z21 Asymptomatic human immunodeficiency virus [HIV] infection status: Secondary | ICD-10-CM | POA: Diagnosis present

## 2022-10-30 DIAGNOSIS — E1022 Type 1 diabetes mellitus with diabetic chronic kidney disease: Secondary | ICD-10-CM | POA: Diagnosis not present

## 2022-10-30 DIAGNOSIS — E109 Type 1 diabetes mellitus without complications: Secondary | ICD-10-CM | POA: Insufficient documentation

## 2022-10-30 DIAGNOSIS — E10649 Type 1 diabetes mellitus with hypoglycemia without coma: Secondary | ICD-10-CM | POA: Diagnosis present

## 2022-10-30 DIAGNOSIS — E162 Hypoglycemia, unspecified: Principal | ICD-10-CM | POA: Diagnosis present

## 2022-10-30 DIAGNOSIS — B20 Human immunodeficiency virus [HIV] disease: Secondary | ICD-10-CM | POA: Diagnosis present

## 2022-10-30 DIAGNOSIS — Z79899 Other long term (current) drug therapy: Secondary | ICD-10-CM | POA: Insufficient documentation

## 2022-10-30 DIAGNOSIS — J45909 Unspecified asthma, uncomplicated: Secondary | ICD-10-CM | POA: Diagnosis present

## 2022-10-30 DIAGNOSIS — I129 Hypertensive chronic kidney disease with stage 1 through stage 4 chronic kidney disease, or unspecified chronic kidney disease: Secondary | ICD-10-CM | POA: Diagnosis not present

## 2022-10-30 DIAGNOSIS — I10 Essential (primary) hypertension: Secondary | ICD-10-CM | POA: Diagnosis present

## 2022-10-30 LAB — URINALYSIS, ROUTINE W REFLEX MICROSCOPIC
Bacteria, UA: NONE SEEN
Bilirubin Urine: NEGATIVE
Glucose, UA: 50 mg/dL — AB
Ketones, ur: 5 mg/dL — AB
Leukocytes,Ua: NEGATIVE
Nitrite: NEGATIVE
Protein, ur: >=300 mg/dL — AB
Specific Gravity, Urine: 1.016 (ref 1.005–1.030)
pH: 5 (ref 5.0–8.0)

## 2022-10-30 LAB — CBG MONITORING, ED
Glucose-Capillary: 157 mg/dL — ABNORMAL HIGH (ref 70–99)
Glucose-Capillary: 114 mg/dL — ABNORMAL HIGH (ref 70–99)
Glucose-Capillary: 142 mg/dL — ABNORMAL HIGH (ref 70–99)
Glucose-Capillary: 154 mg/dL — ABNORMAL HIGH (ref 70–99)
Glucose-Capillary: 178 mg/dL — ABNORMAL HIGH (ref 70–99)

## 2022-10-30 LAB — CBC WITH DIFFERENTIAL/PLATELET
Abs Immature Granulocytes: 0.03 10*3/uL (ref 0.00–0.07)
Basophils Absolute: 0 10*3/uL (ref 0.0–0.1)
Basophils Relative: 0 %
Eosinophils Absolute: 0 10*3/uL (ref 0.0–0.5)
Eosinophils Relative: 0 %
HCT: 41 % (ref 39.0–52.0)
Hemoglobin: 13.5 g/dL (ref 13.0–17.0)
Immature Granulocytes: 0 %
Lymphocytes Relative: 7 %
Lymphs Abs: 0.7 10*3/uL (ref 0.7–4.0)
MCH: 30.6 pg (ref 26.0–34.0)
MCHC: 32.9 g/dL (ref 30.0–36.0)
MCV: 93 fL (ref 80.0–100.0)
Monocytes Absolute: 0.4 10*3/uL (ref 0.1–1.0)
Monocytes Relative: 4 %
Neutro Abs: 8.1 10*3/uL — ABNORMAL HIGH (ref 1.7–7.7)
Neutrophils Relative %: 89 %
Platelets: 302 10*3/uL (ref 150–400)
RBC: 4.41 MIL/uL (ref 4.22–5.81)
RDW: 14.2 % (ref 11.5–15.5)
WBC: 9.2 10*3/uL (ref 4.0–10.5)
nRBC: 0 % (ref 0.0–0.2)

## 2022-10-30 LAB — MAGNESIUM: Magnesium: 1.9 mg/dL (ref 1.7–2.4)

## 2022-10-30 LAB — COMPREHENSIVE METABOLIC PANEL
ALT: 18 U/L (ref 0–44)
AST: 18 U/L (ref 15–41)
Albumin: 3.7 g/dL (ref 3.5–5.0)
Alkaline Phosphatase: 49 U/L (ref 38–126)
Anion gap: 12 (ref 5–15)
BUN: 17 mg/dL (ref 6–20)
CO2: 24 mmol/L (ref 22–32)
Calcium: 9.7 mg/dL (ref 8.9–10.3)
Chloride: 103 mmol/L (ref 98–111)
Creatinine, Ser: 1.45 mg/dL — ABNORMAL HIGH (ref 0.61–1.24)
GFR, Estimated: >60 mL/min (ref >60)
Glucose, Bld: 158 mg/dL — ABNORMAL HIGH (ref 70–99)
Potassium: 3.9 mmol/L (ref 3.5–5.1)
Sodium: 139 mmol/L (ref 135–145)
Total Bilirubin: 0.6 mg/dL (ref 0.3–1.2)
Total Protein: 6.8 g/dL (ref 6.5–8.1)

## 2022-10-30 LAB — TROPONIN I (HIGH SENSITIVITY)
Troponin I (High Sensitivity): 5 ng/L (ref <18)
Troponin I (High Sensitivity): 6 ng/L (ref <18)

## 2022-10-30 LAB — RAPID URINE DRUG SCREEN, HOSP PERFORMED
Amphetamines: NOT DETECTED
Barbiturates: NOT DETECTED
Benzodiazepines: NOT DETECTED
Cocaine: NOT DETECTED
Opiates: NOT DETECTED
Tetrahydrocannabinol: POSITIVE — AB

## 2022-10-30 LAB — CK: Total CK: 107 U/L (ref 49–397)

## 2022-10-30 LAB — ETHANOL: Alcohol, Ethyl (B): <10 mg/dL (ref <10)

## 2022-10-30 MED ORDER — DEXTROSE 5 % AND 0.9 % NACL IV BOLUS
1000.0000 mL | Freq: Once | INTRAVENOUS | Status: AC
  Administered 2022-10-30: 1000 mL via INTRAVENOUS

## 2022-10-30 MED ORDER — SODIUM CHLORIDE 0.9% FLUSH: 3.0000 mL | Freq: Two times a day (BID) | INTRAVENOUS | Status: DC

## 2022-10-30 MED ORDER — SODIUM CHLORIDE 0.9% FLUSH: 3.0000 mL | INTRAVENOUS | Status: DC | PRN

## 2022-10-30 MED ORDER — SODIUM CHLORIDE 0.9 % IV SOLN: 250.0000 mL | INTRAVENOUS | Status: DC | PRN

## 2022-10-30 NOTE — ED Provider Notes (Signed)
[place] EMERGENCY DEPARTMENT AT [facility] Provider Note   CSN: [id number] Arrival date & time: 10/30/22  1648     History {Add pertinent medical, surgical, social history, OB history to HPI:1} Chief Complaint  Patient presents with   Hypoglycemia    [name] is a 37 y.o. male.   Hypoglycemia      Home Medications Prior to Admission medications   Medication Sig Start Date End Date Taking? Authorizing Provider  Accu-Chek Softclix Lancets lancets Use to check blood sugar three times daily. 08/16/22   Storm Frisk, MD  acetaminophen (TYLENOL) 500 MG tablet Take 1 tablet (500 mg total) by mouth every 6 (six) hours as needed. 07/13/22   Redwine, Madison A, PA-C  blood glucose meter kit and supplies Dispense based on patient and insurance preference. Use up to four times daily as directed: For blood sugar monitoring 10/08/20   Armandina Stammer I, NP  Blood Glucose Monitoring Suppl (ACCU-CHEK GUIDE) w/Device KIT Use to check blood sugar 3 times daily. 08/16/22   Storm Frisk, MD  Continuous Glucose Receiver (DEXCOM G7 RECEIVER) DEVI Use to check blood sugar continuously 08/16/22   Storm Frisk, MD  Continuous Glucose Sensor (DEXCOM G7 SENSOR) MISC Use to check blood sugar continuously. Change sensors once every 10 days. 08/16/22   Storm Frisk, MD  elvitegravir-cobicistat-emtricitabine-tenofovir (GENVOYA) 150-150-200-10 MG TABS tablet Take 1 tablet by mouth daily with breakfast. 09/14/22   Veryl Speak, FNP  glucose blood (ACCU-CHEK GUIDE) test strip Use to check blood sugar 3 times daily. 08/16/22   Storm Frisk, MD  hydrALAZINE (APRESOLINE) 25 MG tablet Take 1 tablet (25 mg total) by mouth every 8 (eight) hours. For hypertension 08/16/22   Storm Frisk, MD  insulin glargine (LANTUS SOLOSTAR) 100 UNIT/ML Solostar Pen Inject 18 Units into the skin daily. Make an appt with Dr. Delford Field for more refills. 10/21/22   Storm Frisk, MD   insulin lispro (HUMALOG) 100 UNIT/ML KwikPen Inject 1-6 Units into the skin as directed. ONLY with food - 1 unit per 15 grams of carbohydrates (bread, pasta, fruit, etc) 08/16/22   Storm Frisk, MD  Insulin Pen Needle 31G X 5 MM MISC Use with insulin pen 08/16/22   Storm Frisk, MD  sertraline (ZOLOFT) 100 MG tablet Take 1.5 tablets (150 mg total) by mouth daily. 08/16/22 12/14/22  Storm Frisk, MD  valsartan-hydrochlorothiazide (DIOVAN-HCT) 320-25 MG tablet Take 1 tablet by mouth daily. 08/16/22   Storm Frisk, MD  pravastatin (PRAVACHOL) 20 MG tablet Take 1 tablet (20 mg total) by mouth daily. Patient not taking: Reported on 03/31/2019 03/08/19 07/08/19  Claiborne Rigg, NP      Allergies    Shellfish allergy and Aspirin    Review of Systems   Review of Systems  Physical Exam Updated Vital Signs BP (!) 149/95 (BP Location: Right Arm)   Pulse 78   Temp 98.5 F (36.9 C) (Oral)   Resp 16   Ht 6' (1.829 m)   Wt 87 kg   SpO2 98%   BMI 26.01 kg/m  Physical Exam  ED Results / Procedures / Treatments   Labs (all labs ordered are listed, but only abnormal results are displayed) Labs Reviewed  CBC WITH DIFFERENTIAL/PLATELET - Abnormal; Notable for the following components:      Result Value   Neutro Abs 8.1 (*)    All other components within normal limits  CBG MONITORING, ED -  Abnormal; Notable for the following components:   Glucose-Capillary 157 (*)    All other components within normal limits  COMPREHENSIVE METABOLIC PANEL  ETHANOL  RAPID URINE DRUG SCREEN, HOSP PERFORMED  URINALYSIS, ROUTINE W REFLEX MICROSCOPIC  CBG MONITORING, ED  CBG MONITORING, ED  CBG MONITORING, ED  TROPONIN I (HIGH SENSITIVITY)    EKG None  Radiology No results found.  Procedures Procedures  {Document cardiac monitor, telemetry assessment procedure when appropriate:1}  Medications Ordered in ED Medications  sodium chloride flush (NS) 0.9 % injection 3 mL (has no  administration in time range)  sodium chloride flush (NS) 0.9 % injection 3 mL (has no administration in time range)  0.9 %  sodium chloride infusion (has no administration in time range)    ED Course/ Medical Decision Making/ A&P   {   Click here for ABCD2, HEART and other calculatorsREFRESH Note before signing :1}                              Medical Decision Making Amount and/or Complexity of Data Reviewed Labs: ordered.  Risk Prescription drug management.   ***  {Document critical care time when appropriate:1} {Document review of labs and clinical decision tools ie heart score, Chads2Vasc2 etc:1}  {Document your independent review of radiology images, and any outside records:1} {Document your discussion with family members, caretakers, and with consultants:1} {Document social determinants of health affecting pt's care:1} {Document your decision making why or why not admission, treatments were needed:1} Final Clinical Impression(s) / ED Diagnoses Final diagnoses:  None    Rx / DC Orders ED Discharge Orders     None

## 2022-10-30 NOTE — ED Triage Notes (Signed)
Pt to the ed from car via ems with a CC of hypoglycemia. [place] the  subway drive [place] with a cbg of 45. Ems gave d10 and bgl came up to 180. Pt currently alert and oriented.

## 2022-10-31 ENCOUNTER — Encounter (HOSPITAL_COMMUNITY): Payer: Self-pay | Admitting: Internal Medicine

## 2022-10-31 DIAGNOSIS — E162 Hypoglycemia, unspecified: Secondary | ICD-10-CM

## 2022-10-31 LAB — HEMOGLOBIN A1C
Hgb A1c MFr Bld: 8.6 % — ABNORMAL HIGH (ref 4.8–5.6)
Mean Plasma Glucose: 200.12 mg/dL

## 2022-10-31 LAB — CBG MONITORING, ED
Glucose-Capillary: 117 mg/dL — ABNORMAL HIGH (ref 70–99)
Glucose-Capillary: 141 mg/dL — ABNORMAL HIGH (ref 70–99)
Glucose-Capillary: 204 mg/dL — ABNORMAL HIGH (ref 70–99)
Glucose-Capillary: 233 mg/dL — ABNORMAL HIGH (ref 70–99)
Glucose-Capillary: 50 mg/dL — ABNORMAL LOW (ref 70–99)
Glucose-Capillary: 52 mg/dL — ABNORMAL LOW (ref 70–99)
Glucose-Capillary: 64 mg/dL — ABNORMAL LOW (ref 70–99)
Glucose-Capillary: 80 mg/dL (ref 70–99)

## 2022-10-31 MED ORDER — MIRTAZAPINE 15 MG PO TABS: 7.5000 mg | ORAL_TABLET | Freq: Every day | ORAL | Status: DC

## 2022-10-31 MED ORDER — MIRTAZAPINE 15 MG PO TABS: 7.5000 mg | ORAL_TABLET | Freq: Every day | ORAL | 0 refills | Status: DC

## 2022-10-31 MED ORDER — ACETAMINOPHEN 650 MG RE SUPP: 650.0000 mg | Freq: Four times a day (QID) | RECTAL | Status: DC | PRN

## 2022-10-31 MED ORDER — HYDROCHLOROTHIAZIDE 25 MG PO TABS
25.0000 mg | ORAL_TABLET | Freq: Every day | ORAL | Status: DC
  Administered 2022-10-31: 25 mg via ORAL
  Filled 2022-10-31: qty 1

## 2022-10-31 MED ORDER — IRBESARTAN 300 MG PO TABS
300.0000 mg | ORAL_TABLET | Freq: Every day | ORAL | Status: DC
  Administered 2022-10-31: 300 mg via ORAL
  Filled 2022-10-31: qty 1

## 2022-10-31 MED ORDER — DEXTROSE 10 % IV SOLN: INTRAVENOUS | Status: DC

## 2022-10-31 MED ORDER — MIRTAZAPINE 15 MG PO TBDP: 7.5000 mg | ORAL_TABLET | Freq: Every day | ORAL | Status: DC

## 2022-10-31 MED ORDER — ACETAMINOPHEN 325 MG PO TABS: 650.0000 mg | ORAL_TABLET | Freq: Four times a day (QID) | ORAL | Status: DC | PRN

## 2022-10-31 MED ORDER — ELVITEG-COBIC-EMTRICIT-TENOFAF 150-150-200-10 MG PO TABS
1.0000 | ORAL_TABLET | Freq: Every day | ORAL | Status: DC
  Administered 2022-10-31: 1 via ORAL
  Filled 2022-10-31 (×2): qty 1

## 2022-10-31 MED ORDER — SERTRALINE HCL 50 MG PO TABS
150.0000 mg | ORAL_TABLET | Freq: Every day | ORAL | Status: DC
  Administered 2022-10-31: 150 mg via ORAL
  Filled 2022-10-31: qty 1

## 2022-10-31 MED ORDER — DEXTROSE 50 % IV SOLN
INTRAVENOUS | Status: AC
  Administered 2022-10-31: 50 mL
  Filled 2022-10-31: qty 50

## 2022-10-31 MED ORDER — VALSARTAN-HYDROCHLOROTHIAZIDE 320-25 MG PO TABS: 1.0000 | ORAL_TABLET | Freq: Every day | ORAL | Status: DC

## 2022-10-31 MED ORDER — ENOXAPARIN SODIUM 40 MG/0.4ML IJ SOSY: 40.0000 mg | PREFILLED_SYRINGE | INTRAMUSCULAR | Status: DC

## 2022-10-31 MED ORDER — HYDRALAZINE HCL 25 MG PO TABS
25.0000 mg | ORAL_TABLET | Freq: Three times a day (TID) | ORAL | Status: DC
  Administered 2022-10-31: 25 mg via ORAL
  Filled 2022-10-31: qty 1

## 2022-10-31 NOTE — ED Notes (Signed)
ED TO INPATIENT HANDOFF REPORT  ED Nurse Name and Phone #:  Einar Grad [phone]  S Name/Age/Gender [name] 37 y.o. male Room/Bed: 003C/003C  Code Status   Code Status: Full Code  Home/SNF/Other Home Patient oriented to: self, place, time, and situation Is this baseline? Yes   Triage Complete: Triage complete  Chief Complaint Hypoglycemia [E16.2]  Triage Note Pt to the ed from car via ems with a CC of hypoglycemia. EMS [place] him [place] the  subway drive [place] with a cbg of 45. Ems gave d10 and bgl came up to 180. Pt currently alert and oriented.     Allergies Allergies  Allergen Reactions   Shellfish Allergy Anaphylaxis and Hives   Aspirin Other (See Comments)    Lump in armpit    Level of Care/Admitting Diagnosis ED Disposition     ED Disposition  Admit   Condition  --   Comment  Hospital Area: [facility] [[id number]]  Level of Care: Progressive [102]  Admit to Progressive based on following criteria: GI, ENDOCRINE disease patients with GI bleeding, acute liver failure or pancreatitis, stable with diabetic ketoacidosis or thyrotoxicosis (hypothyroid) state.  May place patient in observation at [facility] or [facility] if equivalent level of care is available:: Yes  Covid Evaluation: Asymptomatic - no recent exposure (last 10 days) testing not required  Diagnosis: Hypoglycemia [242204]  Admitting Physician: John Giovanni [[id number]]  Attending Physician: John Giovanni [[id number]]          B Medical/Surgery History Past Medical History:  Diagnosis Date   Anxiety    Asthma    Closed displaced comminuted fracture of shaft of right tibia 11/01/2017   Closed fracture of base of fifth metatarsal bone of right foot 11/13/2017   Closed fracture of medial malleolus of right ankle 10/31/2017   Depression    Headache    "only w/stress" (11/01/2017)   HIV (human immunodeficiency virus infection) (HCC)    Hypoglycemia due to insulin  06/27/2022   Migraine    "a few/month" (11/01/2017)   Refusal of blood transfusions as patient is Jehovah's Witness    Seizure (HCC)    "only w/my low blood sugars"  (11/01/2017)   Type I diabetes mellitus (HCC)    Past Surgical History:  Procedure Laterality Date   FRACTURE SURGERY     IM NAILING TIBIA Right 11/01/2017   INTRAMEDULLARY (IM) NAIL TIBIALRightGeneral   ORIF ANKLE FRACTURE Right 11/01/2017   ORIF ANKLE FRACTURE Right 11/01/2017   Procedure: OPEN REDUCTION INTERNAL FIXATION (ORIF) ANKLE FRACTURE;  Surgeon: Roby Lofts, MD;  Location: [facility] OR;  Service: Orthopedics;  Laterality: Right;   TIBIA IM NAIL INSERTION Right 11/01/2017   Procedure: INTRAMEDULLARY (IM) NAIL TIBIAL;  Surgeon: Roby Lofts, MD;  Location: [facility] OR;  Service: Orthopedics;  Laterality: Right;     A IV Location/Drains/Wounds Patient Lines/Drains/Airways Status     Active Line/Drains/Airways     Name Placement date Placement time Site Days   Peripheral IV 10/30/22 20 G Left Antecubital 10/30/22  --  Antecubital  1            Intake/Output Last 24 hours  Intake/Output Summary (Last 24 hours) at 10/31/2022 0933 Last data filed at 10/31/2022 0106 Gross per 24 hour  Intake 1000 ml  Output 300 ml  Net 700 ml    Labs/Imaging Results for orders placed or performed during the hospital encounter of 10/30/22 (from the past 48 hour(s))  POC CBG, ED  Status: Abnormal   Collection Time: 10/30/22  5:01 PM  Result Value Ref Range   Glucose-Capillary 157 (H) 70 - 99 mg/dL    Comment: Glucose reference range applies only to samples taken after fasting for at least 8 hours.  CBC with Differential     Status: Abnormal   Collection Time: 10/30/22  5:02 PM  Result Value Ref Range   WBC 9.2 4.0 - 10.5 K/uL   RBC 4.41 4.22 - 5.81 MIL/uL   Hemoglobin 13.5 13.0 - 17.0 g/dL   HCT 46.9 62.9 - 52.8 %   MCV 93.0 80.0 - 100.0 fL   MCH 30.6 26.0 - 34.0 pg   MCHC 32.9 30.0 - 36.0 g/dL   RDW 41.3 24.4 - 01.0 %    Platelets 302 150 - 400 K/uL   nRBC 0.0 0.0 - 0.2 %   Neutrophils Relative % 89 %   Neutro Abs 8.1 (H) 1.7 - 7.7 K/uL   Lymphocytes Relative 7 %   Lymphs Abs 0.7 0.7 - 4.0 K/uL   Monocytes Relative 4 %   Monocytes Absolute 0.4 0.1 - 1.0 K/uL   Eosinophils Relative 0 %   Eosinophils Absolute 0.0 0.0 - 0.5 K/uL   Basophils Relative 0 %   Basophils Absolute 0.0 0.0 - 0.1 K/uL   Immature Granulocytes 0 %   Abs Immature Granulocytes 0.03 0.00 - 0.07 K/uL    Comment: Performed at [facility], [address] [place], [place] [address]  Ethanol     Status: None   Collection Time: 10/30/22  5:30 PM  Result Value Ref Range   Alcohol, Ethyl (B) <10 <10 mg/dL    Comment: (NOTE) Lowest detectable limit for serum alcohol is 10 mg/dL.  For medical purposes only. Performed at [facility], [address], [place], [place] [address]   CBG monitoring, ED (now and then every hour for 3 hours)     Status: Abnormal   Collection Time: 10/30/22  5:36 PM  Result Value Ref Range   Glucose-Capillary 142 (H) 70 - 99 mg/dL    Comment: Glucose reference range applies only to samples taken after fasting for at least 8 hours.  CBG monitoring, ED (now and then every hour for 3 hours)     Status: Abnormal   Collection Time: 10/30/22  6:06 PM  Result Value Ref Range   Glucose-Capillary 114 (H) 70 - 99 mg/dL    Comment: Glucose reference range applies only to samples taken after fasting for at least 8 hours.  Troponin I (High Sensitivity)     Status: None   Collection Time: 10/30/22  6:23 PM  Result Value Ref Range   Troponin I (High Sensitivity) 5 <18 ng/L    Comment: (NOTE) Elevated high sensitivity troponin I (hsTnI) values and significant  changes across serial measurements may suggest ACS but many other  chronic and acute conditions are known to elevate hsTnI results.  Refer to the "Links" section for chest pain algorithms and additional  guidance. Performed at [facility],  [address], [place], [place] [address]   Rapid urine drug screen (hospital performed)     Status: Abnormal   Collection Time: 10/30/22  6:52 PM  Result Value Ref Range   Opiates NONE DETECTED NONE DETECTED   Cocaine NONE DETECTED NONE DETECTED   Benzodiazepines NONE DETECTED NONE DETECTED   Amphetamines NONE DETECTED NONE DETECTED   Tetrahydrocannabinol POSITIVE (A) NONE DETECTED   Barbiturates NONE  DETECTED NONE DETECTED    Comment: (NOTE) DRUG SCREEN FOR MEDICAL PURPOSES ONLY.  IF CONFIRMATION IS NEEDED FOR ANY PURPOSE, NOTIFY LAB WITHIN 5 DAYS.  LOWEST DETECTABLE LIMITS FOR URINE DRUG SCREEN Drug Class                     Cutoff (ng/mL) Amphetamine and metabolites    1000 Barbiturate and metabolites    200 Benzodiazepine                 200 Opiates and metabolites        300 Cocaine and metabolites        300 THC                            50 Performed at [facility], [address] [place], [place] [address]   Urinalysis, Routine w reflex microscopic -Urine, Clean Catch     Status: Abnormal   Collection Time: 10/30/22  6:52 PM  Result Value Ref Range   Color, Urine YELLOW YELLOW   APPearance CLEAR CLEAR   Specific Gravity, Urine 1.016 1.005 - 1.030   pH 5.0 5.0 - 8.0   Glucose, UA 50 (A) NEGATIVE mg/dL   Hgb urine dipstick MODERATE (A) NEGATIVE   Bilirubin Urine NEGATIVE NEGATIVE   Ketones, ur 5 (A) NEGATIVE mg/dL   Protein, ur >=401 (A) NEGATIVE mg/dL   Nitrite NEGATIVE NEGATIVE   Leukocytes,Ua NEGATIVE NEGATIVE   RBC / HPF 11-20 0 - 5 RBC/hpf   WBC, UA 0-5 0 - 5 WBC/hpf   Bacteria, UA NONE SEEN NONE SEEN   Squamous Epithelial / HPF 0-5 0 - 5 /HPF   Mucus PRESENT     Comment: Performed at [facility], [address], [place], [place] [address]  Troponin I (High Sensitivity)     Status: None   Collection Time: 10/30/22  7:22 PM  Result Value Ref Range   Troponin I (High Sensitivity) 6 <18 ng/L    Comment: (NOTE) Elevated high sensitivity  troponin I (hsTnI) values and significant  changes across serial measurements may suggest ACS but many other  chronic and acute conditions are known to elevate hsTnI results.  Refer to the "Links" section for chest pain algorithms and additional  guidance. Performed at [facility], [address] [place], [place] [address]   Comprehensive metabolic panel     Status: Abnormal   Collection Time: 10/30/22  7:22 PM  Result Value Ref Range   Sodium 139 135 - 145 mmol/L   Potassium 3.9 3.5 - 5.1 mmol/L   Chloride 103 98 - 111 mmol/L   CO2 24 22 - 32 mmol/L   Glucose, Bld 158 (H) 70 - 99 mg/dL    Comment: Glucose reference range applies only to samples taken after fasting for at least 8 hours.   BUN 17 6 - 20 mg/dL   Creatinine, Ser 0.34 (H) 0.61 - 1.24 mg/dL   Calcium 9.7 8.9 - 74.2 mg/dL   Total Protein 6.8 6.5 - 8.1 g/dL   Albumin 3.7 3.5 - 5.0 g/dL   AST 18 15 - 41 U/L   ALT 18 0 - 44 U/L   Alkaline Phosphatase 49 38 - 126 U/L   Total Bilirubin 0.6 0.3 - 1.2 mg/dL   GFR, Estimated >59 >56 mL/min    Comment: (NOTE) Calculated using the CKD-EPI Creatinine Equation (2021)  Anion gap 12 5 - 15    Comment: Performed at [facility], [address] [place], [place] [address]  Magnesium     Status: None   Collection Time: 10/30/22  7:28 PM  Result Value Ref Range   Magnesium 1.9 1.7 - 2.4 mg/dL    Comment: Performed at [facility], [address] [place], [place] [address]  CK     Status: None   Collection Time: 10/30/22  7:28 PM  Result Value Ref Range   Total CK 107 49 - 397 U/L    Comment: Performed at [facility], [address], [place], [place] [address]  CBG monitoring, ED (now and then every hour for 3 hours)     Status: Abnormal   Collection Time: 10/30/22  7:40 PM  Result Value Ref Range   Glucose-Capillary 154 (H) 70 - 99 mg/dL    Comment: Glucose reference range applies only to samples taken after fasting for at least 8 hours.   Comment  1 Document in Chart   POC CBG, ED     Status: Abnormal   Collection Time: 10/30/22  9:49 PM  Result Value Ref Range   Glucose-Capillary 178 (H) 70 - 99 mg/dL    Comment: Glucose reference range applies only to samples taken after fasting for at least 8 hours.  POC CBG, ED     Status: Abnormal   Collection Time: 10/31/22 12:47 AM  Result Value Ref Range   Glucose-Capillary 52 (L) 70 - 99 mg/dL    Comment: Glucose reference range applies only to samples taken after fasting for at least 8 hours.  POC CBG, ED     Status: Abnormal   Collection Time: 10/31/22  1:16 AM  Result Value Ref Range   Glucose-Capillary 141 (H) 70 - 99 mg/dL    Comment: Glucose reference range applies only to samples taken after fasting for at least 8 hours.  CBG monitoring, ED     Status: Abnormal   Collection Time: 10/31/22  2:56 AM  Result Value Ref Range   Glucose-Capillary 50 (L) 70 - 99 mg/dL    Comment: Glucose reference range applies only to samples taken after fasting for at least 8 hours.  CBG monitoring, ED     Status: Abnormal   Collection Time: 10/31/22  6:14 AM  Result Value Ref Range   Glucose-Capillary 64 (L) 70 - 99 mg/dL    Comment: Glucose reference range applies only to samples taken after fasting for at least 8 hours.  CBG monitoring, ED     Status: None   Collection Time: 10/31/22  6:46 AM  Result Value Ref Range   Glucose-Capillary 80 70 - 99 mg/dL    Comment: Glucose reference range applies only to samples taken after fasting for at least 8 hours.  CBG monitoring, ED     Status: Abnormal   Collection Time: 10/31/22  8:14 AM  Result Value Ref Range   Glucose-Capillary 117 (H) 70 - 99 mg/dL    Comment: Glucose reference range applies only to samples taken after fasting for at least 8 hours.  Hemoglobin A1c     Status: Abnormal   Collection Time: 10/31/22  8:45 AM  Result Value Ref Range   Hgb A1c MFr Bld 8.6 (H) 4.8 - 5.6 %    Comment: (NOTE) Pre diabetes:           5.7%-6.4%  Diabetes:              >  6.4%  Glycemic control for   <7.0% adults with diabetes    Mean Plasma Glucose 200.12 mg/dL    Comment: Performed at [facility], [address] [place], [place] [address]   No results found.  Pending Labs Unresulted Labs (From admission, onward)    None       Vitals/Pain Today's Vitals   10/31/22 0330 10/31/22 0600 10/31/22 0840 10/31/22 0930  BP: (!) 136/109 (!) 136/94  (!) 150/107  Pulse: 76 71  82  Resp: 15 14  11   Temp: 98.2 F (36.8 C) 98.3 F (36.8 C)    TempSrc:      SpO2: 100% 100%  100%  Weight:      Height:      PainSc:   0-No pain     Isolation Precautions No active isolations  Medications Medications  sodium chloride flush (NS) 0.9 % injection 3 mL (3 mLs Intravenous Not Given 10/30/22 2124)  sodium chloride flush (NS) 0.9 % injection 3 mL (has no administration in time range)  0.9 %  sodium chloride infusion (has no administration in time range)  elvitegravir-cobicistat-emtricitabine-tenofovir (GENVOYA) 150-150-200-10 MG tablet 1 tablet (has no administration in time range)  hydrALAZINE (APRESOLINE) tablet 25 mg (25 mg Oral Given 10/31/22 0619)  sertraline (ZOLOFT) tablet 150 mg (has no administration in time range)  enoxaparin (LOVENOX) injection 40 mg (has no administration in time range)  acetaminophen (TYLENOL) tablet 650 mg (has no administration in time range)    Or  acetaminophen (TYLENOL) suppository 650 mg (has no administration in time range)  dextrose 10 % infusion ( Intravenous New Bag/Given 10/31/22 0300)  irbesartan (AVAPRO) tablet 300 mg (has no administration in time range)    And  hydrochlorothiazide (HYDRODIURIL) tablet 25 mg (has no administration in time range)  dextrose 5 % and 0.9% NaCl 5-0.9 % bolus 1,000 mL (0 mLs Intravenous Stopped 10/30/22 2124)  dextrose 50 % solution (50 mLs  Given 10/31/22 0058)    Mobility walks     Focused Assessments Hypoglycemia   R Recommendations: See  Admitting Provider Note  Report given to:   Additional Notes: Pt here with recurrent hypoglycemia on D10 infusion
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
## 2022-10-31 NOTE — Discharge Summary (Addendum)
@LOGO @   PT LEFT AMA SUMMARY  [name] MRN - [id number] DOB - Jan 26, 1986  Date of Admission - 10/30/2022 Date LEFT AMA:   Attending Physician:  Hughie Closs, MD  Patient's PCP:  Storm Frisk, MD  Disposition: LEFT AMA  Follow-up Appts:  Not able to be arranged or discussed as pt LEFT AMA  Diagnoses at time pt LEFT AMA: Recurrent hypoglycemia in a patient with type 1 diabetes mellitus  Initial presentation: HPI: [name] is a 37 y.o. male with medical history significant of asthma, anxiety/depression, HIV, type 1 diabetes, CKD stage IIIa, hypertension, hyperlipidemia, marijuana abuse.  Admitted last month 8/3-8/4 for hypoglycemia with glucose down to 20s in the setting of patient not consuming his meals properly and still taking long-acting insulin.  Dose of long-acting insulin was adjusted.   Patient presents to the ED today via EMS for evaluation of hypoglycemia.  [place] CBG of 45.  EMS gave D10 and CBG 157 on arrival to the ED.  In the ED, he was given 1 L of D5-NS bolus after which glucose improved initially but then dropped back down to 52 and was given D50.  Creatinine 1.4 (stable), troponin negative x 2, UDS positive for THC.   It was difficult to obtain history from the patient as he did not seem interested in answering questions and became quite agitated.  Told me he is very frustrated that he keeps coming back to the hospital for the same problem.  States he did not take any insulin yesterday as he did not eat anything.  Patient is frustrated that due to his current financial situation, he is not able to eat regular meals but at the same time being told by his doctor that he has to take insulin for his diabetes.  He is not able to tell me exactly how many units of long or short acting insulin he takes at home.  When asked if he is checking his blood sugar at home, he replied by saying yes but is not able to tell me  how often he is checking it.  When asked if he has ever seen an endocrinologist, patient informed me that he has tried but is not able to get an appointment at any endocrinology practice as they do not accept his insurance.  Hospital Course: Listed below are the active problems present, and the status of the care of these problems, at the time the pt decided to LEAVE AMA: Patient was admitted due to recurrent hypoglycemia with initial blood sugar of 40s with EMS, he was started on dextrose 10%.  He is hyperglycemic now.  I had a lengthy discussion with him that we would like to discontinue dextrose drip and monitor him overnight since this is his second hospitalization for the similar complaint.  Patient did not want to stay and he was afraid that he would lose the job if he would stay overnight.  I have offered him medical letter from our hospital which he can show to the job but he tells me that actually it is the recurrent hospitalization due to hypoglycemia which if known to his employer may jeopardize his job and thus he does not want any letter.  He obviously appeared to be depressed for which psychiatry was consulted.  I did not have any suspicion of him not having the capacity to make decisions for himself.  He had full capacity.  He obviously was  annoyed and frustrated for being in the hospital again and again as mentioned in the H&P by admitting hospitalist as well.  He denied any suicidal thoughts or ideations with me.  He could not even tell me how many units of Lantus he takes.  According to the documentation from recent hospitalization, he was supposed to be taking 16 units of Lantus but at the time of discharge, he was advised to take 10 units due to recurrent hypoglycemia.  Patient tells me that even prior to previous hospitalization, he was taking more than 16 units and recently he was taking more than 16 units again.  He did not remember what instructions he was given during recent discharge.   Patient clearly appears to be very noncompliant.  Interestingly, the reason for his hypoglycemia has been said to be inconsistent meals due to not being able to afford however his UDS is positive with marijuana.  Even after lengthy discussion and counseling to stay in the hospital so we can better manage his hypoglycemia, patient decided to leave AGAINST MEDICAL ADVICE.    Medication List    Unable to be finalized as pt LEFT AMA  Day of Discharge Wt Readings from Last 3 Encounters:  10/30/22 87 kg  08/16/22 87.4 kg  08/03/22 86.2 kg   Temp Readings from Last 3 Encounters:  10/31/22 98.6 F (37 C) (Oral)  10/02/22 98.6 F (37 C) (Oral)  08/03/22 98.7 F (37.1 C) (Oral)   BP Readings from Last 3 Encounters:  10/31/22 132/81  10/02/22 (!) 141/99  08/22/22 (!) 155/91   Pulse Readings from Last 3 Encounters:  10/31/22 67  10/02/22 87  08/22/22 92    Physical Exam: Exam not able to be completed at time of d/c as pt LEFT AMA  11:38 AM 10/31/22  Hughie Closs, MD [facility] Office  [phone]   Addendum: Apparently, patient was seen by psychiatry right before he was leaving and they sent me a message to prescribe him Remeron 7.5 mg daily.  Patient had already left by the time I had reviewed this message however I prescribed this medication per psychiatry recommendations to his [facility] pharmacy at [facility].

## 2022-10-31 NOTE — Social Work (Signed)
This CSW has reviewed the chart at this time patient has left AMA. TOC was not able to engage.

## 2022-10-31 NOTE — H&P (Signed)
History and Physical    [name] ZOX:[id number] DOB: Jan 04, 1986 DOA: 10/30/2022  PCP: Storm Frisk, MD  Patient coming from: Home  Chief Complaint: Hypoglycemia  HPI: [name] is a 37 y.o. male with medical history significant of asthma, anxiety/depression, HIV, type 1 diabetes, CKD stage IIIa, hypertension, hyperlipidemia, marijuana abuse.  Admitted last month 8/3-8/4 for hypoglycemia with glucose down to 20s in the setting of patient not consuming his meals properly and still taking long-acting insulin.  Dose of long-acting insulin was adjusted.  Patient presents to the ED today via EMS for evaluation of hypoglycemia.  [place] CBG of 45.  EMS gave D10 and CBG 157 on arrival to the ED.  In the ED, he was given 1 L of D5-NS bolus after which glucose improved initially but then dropped back down to 52 and was given D50.  Creatinine 1.4 (stable), troponin negative x 2, UDS positive for THC.  It was difficult to obtain history from the patient as he did not seem interested in answering questions and became quite agitated.  Told me he is very frustrated that he keeps coming back to the hospital for the same problem.  States he did not take any insulin yesterday as he did not eat anything.  Patient is frustrated that due to his current financial situation, he is not able to eat regular meals but at the same time being told by his doctor that he has to take insulin for his diabetes.  He is not able to tell me exactly how many units of long or short acting insulin he takes at home.  When asked if he is checking his blood sugar at home, he replied by saying yes but is not able to tell me how often he is checking it.  When asked if he has ever seen an endocrinologist, patient informed me that he has tried but is not able to get an appointment at any endocrinology practice as they do not accept his insurance.  Review of Systems:  Review of  Systems  All other systems reviewed and are negative.   Past Medical History:  Diagnosis Date   Anxiety    Asthma    Closed displaced comminuted fracture of shaft of right tibia 11/01/2017   Closed fracture of base of fifth metatarsal bone of right foot 11/13/2017   Closed fracture of medial malleolus of right ankle 10/31/2017   Depression    Headache    "only w/stress" (11/01/2017)   HIV (human immunodeficiency virus infection) (HCC)    Hypoglycemia due to insulin 06/27/2022   Migraine    "a few/month" (11/01/2017)   Refusal of blood transfusions as patient is Jehovah's Witness    Seizure (HCC)    "only w/my low blood sugars"  (11/01/2017)   Type I diabetes mellitus (HCC)     Past Surgical History:  Procedure Laterality Date   FRACTURE SURGERY     IM NAILING TIBIA Right 11/01/2017   INTRAMEDULLARY (IM) NAIL TIBIALRightGeneral   ORIF ANKLE FRACTURE Right 11/01/2017   ORIF ANKLE FRACTURE Right 11/01/2017   Procedure: OPEN REDUCTION INTERNAL FIXATION (ORIF) ANKLE FRACTURE;  Surgeon: Roby Lofts, MD;  Location: [facility] OR;  Service: Orthopedics;  Laterality: Right;   TIBIA IM NAIL INSERTION Right 11/01/2017   Procedure: INTRAMEDULLARY (IM) NAIL TIBIAL;  Surgeon: Roby Lofts, MD;  Location: [facility] OR;  Service: Orthopedics;  Laterality: Right;     reports that  he has been smoking cigars. He started smoking about 7 years ago. He has never used smokeless tobacco. He reports that he does not currently use alcohol after a past usage of about 7.0 standard drinks of alcohol per week. He reports current drug use. Frequency: 2.00 times per week. Drug: Marijuana.  Allergies  Allergen Reactions   Shellfish Allergy Anaphylaxis and Hives   Aspirin Other (See Comments)    Lump in armpit    Family History  Problem Relation Age of Onset   Thyroid disease Mother    Diabetes Mother    Breast cancer Maternal Grandmother    Diabetes Maternal Grandmother     Prior to Admission medications    Medication Sig Start Date End Date Taking? Authorizing Provider  acetaminophen (TYLENOL) 500 MG tablet Take 1 tablet (500 mg total) by mouth every 6 (six) hours as needed. 07/13/22  Yes Redwine, Madison A, PA-C  elvitegravir-cobicistat-emtricitabine-tenofovir (GENVOYA) 150-150-200-10 MG TABS tablet Take 1 tablet by mouth daily with breakfast. 09/14/22  Yes Veryl Speak, FNP  hydrALAZINE (APRESOLINE) 25 MG tablet Take 1 tablet (25 mg total) by mouth every 8 (eight) hours. For hypertension 08/16/22  Yes Storm Frisk, MD  sertraline (ZOLOFT) 100 MG tablet Take 1.5 tablets (150 mg total) by mouth daily. 08/16/22 12/14/22 Yes Storm Frisk, MD  Accu-Chek Softclix Lancets lancets Use to check blood sugar three times daily. 08/16/22   Storm Frisk, MD  blood glucose meter kit and supplies Dispense based on patient and insurance preference. Use up to four times daily as directed: For blood sugar monitoring 10/08/20   Armandina Stammer I, NP  Blood Glucose Monitoring Suppl (ACCU-CHEK GUIDE) w/Device KIT Use to check blood sugar 3 times daily. 08/16/22   Storm Frisk, MD  Continuous Glucose Receiver (DEXCOM G7 RECEIVER) DEVI Use to check blood sugar continuously 08/16/22   Storm Frisk, MD  Continuous Glucose Sensor (DEXCOM G7 SENSOR) MISC Use to check blood sugar continuously. Change sensors once every 10 days. 08/16/22   Storm Frisk, MD  glucose blood (ACCU-CHEK GUIDE) test strip Use to check blood sugar 3 times daily. 08/16/22   Storm Frisk, MD  insulin glargine (LANTUS SOLOSTAR) 100 UNIT/ML Solostar Pen Inject 18 Units into the skin daily. Make an appt with Dr. Delford Field for more refills. 10/21/22   Storm Frisk, MD  insulin lispro (HUMALOG) 100 UNIT/ML KwikPen Inject 1-6 Units into the skin as directed. ONLY with food - 1 unit per 15 grams of carbohydrates (bread, pasta, fruit, etc) 08/16/22   Storm Frisk, MD  Insulin Pen Needle 31G X 5 MM MISC Use with insulin pen 08/16/22    Storm Frisk, MD  valsartan-hydrochlorothiazide (DIOVAN-HCT) 320-25 MG tablet Take 1 tablet by mouth daily. 08/16/22   Storm Frisk, MD  pravastatin (PRAVACHOL) 20 MG tablet Take 1 tablet (20 mg total) by mouth daily. Patient not taking: Reported on 03/31/2019 03/08/19 07/08/19  Claiborne Rigg, NP    Physical Exam: Vitals:   10/30/22 2130 10/30/22 2200 10/30/22 2300 10/30/22 2320  BP: (!) 143/104 (!) 150/99 (!) 150/98   Pulse: 71 71 67   Resp: 15 15 12    Temp:    98.4 F (36.9 C)  TempSrc:    Axillary  SpO2: 100% 100% 100%   Weight:      Height:        Physical Exam Vitals reviewed.  Constitutional:      General: He is not  in acute distress. HENT:     Head: Normocephalic and atraumatic.  Eyes:     Extraocular Movements: Extraocular movements intact.  Cardiovascular:     Rate and Rhythm: Normal rate and regular rhythm.     Pulses: Normal pulses.  Pulmonary:     Effort: Pulmonary effort is normal. No respiratory distress.     Breath sounds: Normal breath sounds. No wheezing or rales.  Abdominal:     General: Bowel sounds are normal. There is no distension.     Palpations: Abdomen is soft.     Tenderness: There is no abdominal tenderness.  Musculoskeletal:     Cervical back: Normal range of motion.     Right lower leg: No edema.     Left lower leg: No edema.  Skin:    General: Skin is warm and dry.  Neurological:     General: No focal deficit present.     Mental Status: He is alert and oriented to person, place, and time.  Psychiatric:     Comments: Agitated     Labs on Admission: I have personally reviewed following labs and imaging studies  CBC: Recent Labs  Lab 10/30/22 1702  WBC 9.2  NEUTROABS 8.1*  HGB 13.5  HCT 41.0  MCV 93.0  PLT 302   Basic Metabolic Panel: Recent Labs  Lab 10/30/22 1922 10/30/22 1928  NA 139  --   K 3.9  --   CL 103  --   CO2 24  --   GLUCOSE 158*  --   BUN 17  --   CREATININE 1.45*  --   CALCIUM 9.7  --   MG   --  1.9   GFR: Estimated Creatinine Clearance: 77.3 mL/min (A) (by C-G formula based on SCr of 1.45 mg/dL (H)). Liver Function Tests: Recent Labs  Lab 10/30/22 1922  AST 18  ALT 18  ALKPHOS 49  BILITOT 0.6  PROT 6.8  ALBUMIN 3.7   No results for input(s): "LIPASE", "AMYLASE" in the last 168 hours. No results for input(s): "AMMONIA" in the last 168 hours. Coagulation Profile: No results for input(s): "INR", "PROTIME" in the last 168 hours. Cardiac Enzymes: Recent Labs  Lab 10/30/22 1928  CKTOTAL 107   BNP (last 3 results) No results for input(s): "PROBNP" in the last 8760 hours. HbA1C: No results for input(s): "HGBA1C" in the last 72 hours. CBG: Recent Labs  Lab 10/30/22 1736 10/30/22 1806 10/30/22 1940 10/30/22 2149 10/31/22 0047  GLUCAP 142* 114* 154* 178* 52*   Lipid Profile: No results for input(s): "CHOL", "HDL", "LDLCALC", "TRIG", "CHOLHDL", "LDLDIRECT" in the last 72 hours. Thyroid Function Tests: No results for input(s): "TSH", "T4TOTAL", "FREET4", "T3FREE", "THYROIDAB" in the last 72 hours. Anemia Panel: No results for input(s): "VITAMINB12", "FOLATE", "FERRITIN", "TIBC", "IRON", "RETICCTPCT" in the last 72 hours. Urine analysis:    Component Value Date/Time   COLORURINE YELLOW 10/30/2022 1852   APPEARANCEUR CLEAR 10/30/2022 1852   LABSPEC 1.016 10/30/2022 1852   PHURINE 5.0 10/30/2022 1852   GLUCOSEU 50 (A) 10/30/2022 1852   HGBUR MODERATE (A) 10/30/2022 1852   BILIRUBINUR NEGATIVE 10/30/2022 1852   KETONESUR 5 (A) 10/30/2022 1852   PROTEINUR >=300 (A) 10/30/2022 1852   UROBILINOGEN 0.2 02/10/2015 1416   NITRITE NEGATIVE 10/30/2022 1852   LEUKOCYTESUR NEGATIVE 10/30/2022 1852    Radiological Exams on Admission: No results found.  EKG: Independently reviewed.  Sinus rhythm, no significant change compared to previous tracing.  Assessment and Plan  Recurrent hypoglycemia  Type 1 diabetes Recurrent hypoglycemia in the setting of long-acting  insulin use and patient not being able to afford eating regular meals due to his current financial situation.  CBG initially 40s with EMS and he remained hypoglycemic despite receiving multiple doses of IV dextrose in the ED.  Hold insulin and admit to progressive care unit for close monitoring.  Start D10 drip at 75 cc/h.  Monitor CBG every 2 hours and adjust the rate of IV dextrose accordingly.  Regular diet ordered.  TOC and diabetes coordinator consulted.  Asthma Stable, no signs of acute exacerbation.  Not on inhalers at home.  HIV Continue Genvoya.  Hypertension Systolic in 140s to 150s.  Continue hydralazine and Diovan.  Marijuana abuse Continue to counsel to quit.  CODE STATUS discussion Depression When I discussed CODE STATUS with the patient he seemed quite agitated and told me he wants to be DNR/DNI and at the same time he also told me that he feels very depressed about his current situation.  When I asked him if he has had any suicidal thoughts, patient replied by stating "I will not answer this question" and also stated " I do not want to go down this route as I know I will be sent somewhere else."  He has known history of depression on Zoloft.  Psychiatry consulted for further evaluation and capacity evaluation.  DVT prophylaxis: Lovenox Level of care: Progressive Care Unit Admission status: It is my clinical opinion that referral for OBSERVATION is reasonable and necessary in this patient based on the above information provided. The aforementioned taken together are felt to place the patient at high risk for further clinical deterioration. However, it is anticipated that the patient may be medically stable for discharge from the hospital within 24 to 48 hours.  John Giovanni MD [facility]  If 7PM-7AM, please contact night-coverage [email or web address]  10/31/2022, 12:55 AM

## 2022-11-11 ENCOUNTER — Other Ambulatory Visit: Payer: Self-pay | Admitting: Critical Care Medicine

## 2022-11-11 ENCOUNTER — Other Ambulatory Visit: Payer: Self-pay

## 2022-11-11 MED ORDER — LANTUS SOLOSTAR 100 UNIT/ML ~~LOC~~ SOPN
18.0000 [IU] | PEN_INJECTOR | Freq: Every day | SUBCUTANEOUS | 0 refills | Status: DC
  Filled 2022-11-11: qty 6, 33d supply, fill #0

## 2022-11-15 ENCOUNTER — Other Ambulatory Visit: Payer: Self-pay

## 2022-11-29 ENCOUNTER — Other Ambulatory Visit: Payer: Self-pay

## 2022-11-29 ENCOUNTER — Encounter (HOSPITAL_COMMUNITY): Payer: Self-pay

## 2022-11-29 ENCOUNTER — Inpatient Hospital Stay (HOSPITAL_COMMUNITY)
Admission: EM | Admit: 2022-11-29 | Discharge: 2022-12-01 | DRG: 638 | Discharge status: Against Medical Advice | Payer: MEDICAID | Attending: Internal Medicine | Admitting: Internal Medicine

## 2022-11-29 ENCOUNTER — Emergency Department (HOSPITAL_COMMUNITY): Payer: MEDICAID

## 2022-11-29 DIAGNOSIS — E10649 Type 1 diabetes mellitus with hypoglycemia without coma: Secondary | ICD-10-CM | POA: Diagnosis present

## 2022-11-29 DIAGNOSIS — Z1152 Encounter for screening for COVID-19: Secondary | ICD-10-CM | POA: Diagnosis not present

## 2022-11-29 DIAGNOSIS — Z886 Allergy status to analgesic agent status: Secondary | ICD-10-CM | POA: Diagnosis not present

## 2022-11-29 DIAGNOSIS — E1022 Type 1 diabetes mellitus with diabetic chronic kidney disease: Secondary | ICD-10-CM | POA: Diagnosis present

## 2022-11-29 DIAGNOSIS — Z803 Family history of malignant neoplasm of breast: Secondary | ICD-10-CM

## 2022-11-29 DIAGNOSIS — R45851 Suicidal ideations: Secondary | ICD-10-CM | POA: Diagnosis present

## 2022-11-29 DIAGNOSIS — Z794 Long term (current) use of insulin: Secondary | ICD-10-CM | POA: Diagnosis not present

## 2022-11-29 DIAGNOSIS — Z531 Procedure and treatment not carried out because of patient's decision for reasons of belief and group pressure: Secondary | ICD-10-CM | POA: Diagnosis present

## 2022-11-29 DIAGNOSIS — N179 Acute kidney failure, unspecified: Secondary | ICD-10-CM | POA: Diagnosis present

## 2022-11-29 DIAGNOSIS — Z66 Do not resuscitate: Secondary | ICD-10-CM | POA: Diagnosis present

## 2022-11-29 DIAGNOSIS — Z5900 Homelessness unspecified: Secondary | ICD-10-CM

## 2022-11-29 DIAGNOSIS — E860 Dehydration: Secondary | ICD-10-CM | POA: Diagnosis present

## 2022-11-29 DIAGNOSIS — J45909 Unspecified asthma, uncomplicated: Secondary | ICD-10-CM | POA: Diagnosis present

## 2022-11-29 DIAGNOSIS — Z91013 Allergy to seafood: Secondary | ICD-10-CM

## 2022-11-29 DIAGNOSIS — I1310 Hypertensive heart and chronic kidney disease without heart failure, with stage 1 through stage 4 chronic kidney disease, or unspecified chronic kidney disease: Secondary | ICD-10-CM | POA: Diagnosis present

## 2022-11-29 DIAGNOSIS — F332 Major depressive disorder, recurrent severe without psychotic features: Secondary | ICD-10-CM | POA: Diagnosis present

## 2022-11-29 DIAGNOSIS — E162 Hypoglycemia, unspecified: Secondary | ICD-10-CM | POA: Diagnosis present

## 2022-11-29 DIAGNOSIS — B9789 Other viral agents as the cause of diseases classified elsewhere: Secondary | ICD-10-CM | POA: Diagnosis present

## 2022-11-29 DIAGNOSIS — Z833 Family history of diabetes mellitus: Secondary | ICD-10-CM

## 2022-11-29 DIAGNOSIS — Z79899 Other long term (current) drug therapy: Secondary | ICD-10-CM

## 2022-11-29 DIAGNOSIS — Z5902 Unsheltered homelessness: Secondary | ICD-10-CM | POA: Diagnosis not present

## 2022-11-29 DIAGNOSIS — Z5329 Procedure and treatment not carried out because of patient's decision for other reasons: Secondary | ICD-10-CM | POA: Diagnosis present

## 2022-11-29 DIAGNOSIS — N1831 Chronic kidney disease, stage 3a: Secondary | ICD-10-CM | POA: Diagnosis present

## 2022-11-29 DIAGNOSIS — Z21 Asymptomatic human immunodeficiency virus [HIV] infection status: Secondary | ICD-10-CM | POA: Diagnosis present

## 2022-11-29 DIAGNOSIS — Z8349 Family history of other endocrine, nutritional and metabolic diseases: Secondary | ICD-10-CM

## 2022-11-29 DIAGNOSIS — F419 Anxiety disorder, unspecified: Secondary | ICD-10-CM | POA: Diagnosis present

## 2022-11-29 DIAGNOSIS — E1065 Type 1 diabetes mellitus with hyperglycemia: Secondary | ICD-10-CM | POA: Diagnosis not present

## 2022-11-29 LAB — COMPREHENSIVE METABOLIC PANEL
ALT: 17 U/L (ref 0–44)
AST: 21 U/L (ref 15–41)
Albumin: 3.8 g/dL (ref 3.5–5.0)
Alkaline Phosphatase: 62 U/L (ref 38–126)
Anion gap: 15 (ref 5–15)
BUN: 13 mg/dL (ref 6–20)
CO2: 21 mmol/L — ABNORMAL LOW (ref 22–32)
Calcium: 9.7 mg/dL (ref 8.9–10.3)
Chloride: 105 mmol/L (ref 98–111)
Creatinine, Ser: 1.38 mg/dL — ABNORMAL HIGH (ref 0.61–1.24)
GFR, Estimated: >60 mL/min (ref >60)
Glucose, Bld: 38 mg/dL — CL (ref 70–99)
Potassium: 3.9 mmol/L (ref 3.5–5.1)
Sodium: 141 mmol/L (ref 135–145)
Total Bilirubin: 0.5 mg/dL (ref 0.3–1.2)
Total Protein: 7.3 g/dL (ref 6.5–8.1)

## 2022-11-29 LAB — GLUCOSE, CAPILLARY
Glucose-Capillary: 102 mg/dL — ABNORMAL HIGH (ref 70–99)
Glucose-Capillary: 185 mg/dL — ABNORMAL HIGH (ref 70–99)
Glucose-Capillary: 268 mg/dL — ABNORMAL HIGH (ref 70–99)
Glucose-Capillary: 155 mg/dL — ABNORMAL HIGH (ref 70–99)

## 2022-11-29 LAB — RESPIRATORY PANEL BY PCR

## 2022-11-29 LAB — CBC
HCT: 46.1 % (ref 39.0–52.0)
Hemoglobin: 15.6 g/dL (ref 13.0–17.0)
MCH: 30.8 pg (ref 26.0–34.0)
MCHC: 33.8 g/dL (ref 30.0–36.0)
MCV: 90.9 fL (ref 80.0–100.0)
Platelets: 292 10*3/uL (ref 150–400)
RBC: 5.07 MIL/uL (ref 4.22–5.81)
RDW: 14.7 % (ref 11.5–15.5)
WBC: 7.2 10*3/uL (ref 4.0–10.5)
nRBC: 0 % (ref 0.0–0.2)

## 2022-11-29 LAB — LIPASE, BLOOD: Lipase: 40 U/L (ref 11–51)

## 2022-11-29 LAB — CBG MONITORING, ED
Glucose-Capillary: 211 mg/dL — ABNORMAL HIGH (ref 70–99)
Glucose-Capillary: 44 mg/dL — CL (ref 70–99)
Glucose-Capillary: 44 mg/dL — CL (ref 70–99)
Glucose-Capillary: 159 mg/dL — ABNORMAL HIGH (ref 70–99)
Glucose-Capillary: 76 mg/dL (ref 70–99)
Glucose-Capillary: 65 mg/dL — ABNORMAL LOW (ref 70–99)
Glucose-Capillary: 84 mg/dL (ref 70–99)
Glucose-Capillary: 107 mg/dL — ABNORMAL HIGH (ref 70–99)

## 2022-11-29 LAB — RESP PANEL BY RT-PCR (RSV, FLU A&B, COVID)  RVPGX2
Influenza A by PCR: NEGATIVE
Influenza B by PCR: NEGATIVE
Resp Syncytial Virus by PCR: NEGATIVE
SARS Coronavirus 2 by RT PCR: NEGATIVE

## 2022-11-29 MED ORDER — DEXTROSE 50 % IV SOLN
1.0000 | Freq: Once | INTRAVENOUS | Status: AC
  Administered 2022-11-29: 50 mL via INTRAVENOUS

## 2022-11-29 MED ORDER — METOCLOPRAMIDE HCL 5 MG/ML IJ SOLN
10.0000 mg | Freq: Once | INTRAMUSCULAR | Status: AC
  Administered 2022-11-29: 10 mg via INTRAVENOUS
  Filled 2022-11-29: qty 2

## 2022-11-29 MED ORDER — INSULIN GLARGINE-YFGN 100 UNIT/ML ~~LOC~~ SOLN
14.0000 [IU] | Freq: Every day | SUBCUTANEOUS | Status: DC
  Administered 2022-11-29: 14 [IU] via SUBCUTANEOUS
  Filled 2022-11-29 (×3): qty 0.14

## 2022-11-29 MED ORDER — KETOROLAC TROMETHAMINE 15 MG/ML IJ SOLN
15.0000 mg | Freq: Once | INTRAMUSCULAR | Status: AC
  Administered 2022-11-29: 15 mg via INTRAVENOUS
  Filled 2022-11-29: qty 1

## 2022-11-29 MED ORDER — ONDANSETRON HCL 4 MG/2ML IJ SOLN: 4.0000 mg | Freq: Four times a day (QID) | INTRAMUSCULAR | Status: DC | PRN

## 2022-11-29 MED ORDER — SERTRALINE HCL 25 MG PO TABS
150.0000 mg | ORAL_TABLET | Freq: Every day | ORAL | Status: DC
  Administered 2022-11-30 – 2022-12-01 (×2): 150 mg via ORAL
  Filled 2022-11-29 (×2): qty 2

## 2022-11-29 MED ORDER — DEXTROSE 50 % IV SOLN
INTRAVENOUS | Status: AC
  Administered 2022-11-29: 50 mL via INTRAVENOUS
  Filled 2022-11-29: qty 50

## 2022-11-29 MED ORDER — DEXTROSE 10 % IV SOLN: INTRAVENOUS | Status: DC

## 2022-11-29 MED ORDER — IRBESARTAN 300 MG PO TABS
300.0000 mg | ORAL_TABLET | Freq: Every day | ORAL | Status: DC
  Administered 2022-11-29 – 2022-12-01 (×3): 300 mg via ORAL
  Filled 2022-11-29 (×3): qty 1

## 2022-11-29 MED ORDER — SODIUM CHLORIDE 0.9 % IV SOLN: 250.0000 mL | INTRAVENOUS | Status: DC | PRN

## 2022-11-29 MED ORDER — MIRTAZAPINE 15 MG PO TABS
7.5000 mg | ORAL_TABLET | Freq: Every day | ORAL | Status: DC
  Administered 2022-11-29 – 2022-11-30 (×2): 7.5 mg via ORAL
  Filled 2022-11-29 (×2): qty 1

## 2022-11-29 MED ORDER — HYDROCHLOROTHIAZIDE 25 MG PO TABS
25.0000 mg | ORAL_TABLET | Freq: Every day | ORAL | Status: DC
  Administered 2022-11-29 – 2022-12-01 (×3): 25 mg via ORAL
  Filled 2022-11-29 (×3): qty 1

## 2022-11-29 MED ORDER — ELVITEG-COBIC-EMTRICIT-TENOFAF 150-150-200-10 MG PO TABS
1.0000 | ORAL_TABLET | Freq: Every day | ORAL | Status: DC
  Administered 2022-11-30 – 2022-12-01 (×2): 1 via ORAL
  Filled 2022-11-29 (×3): qty 1

## 2022-11-29 MED ORDER — RIVAROXABAN 10 MG PO TABS
10.0000 mg | ORAL_TABLET | Freq: Every day | ORAL | Status: DC
  Administered 2022-11-29: 10 mg via ORAL
  Filled 2022-11-29: qty 1

## 2022-11-29 MED ORDER — SODIUM CHLORIDE 0.9% FLUSH
3.0000 mL | Freq: Two times a day (BID) | INTRAVENOUS | Status: DC
  Administered 2022-11-29 – 2022-12-01 (×5): 3 mL via INTRAVENOUS

## 2022-11-29 MED ORDER — ONDANSETRON HCL 4 MG/2ML IJ SOLN
4.0000 mg | Freq: Once | INTRAMUSCULAR | Status: AC | PRN
  Administered 2022-11-29: 4 mg via INTRAVENOUS
  Filled 2022-11-29: qty 2

## 2022-11-29 MED ORDER — SERTRALINE HCL 100 MG PO TABS: 200.0000 mg | ORAL_TABLET | Freq: Every day | ORAL | Status: DC

## 2022-11-29 MED ORDER — VALSARTAN-HYDROCHLOROTHIAZIDE 320-25 MG PO TABS: 1.0000 | ORAL_TABLET | Freq: Every day | ORAL | Status: DC

## 2022-11-29 MED ORDER — HYDRALAZINE HCL 25 MG PO TABS
25.0000 mg | ORAL_TABLET | Freq: Three times a day (TID) | ORAL | Status: DC
  Administered 2022-11-29 – 2022-12-01 (×6): 25 mg via ORAL
  Filled 2022-11-29 (×6): qty 1

## 2022-11-29 MED ORDER — INSULIN ASPART 100 UNIT/ML IJ SOLN
0.0000 [IU] | Freq: Three times a day (TID) | INTRAMUSCULAR | Status: DC
  Administered 2022-11-29: 3 [IU] via SUBCUTANEOUS
  Administered 2022-11-29: 8 [IU] via SUBCUTANEOUS
  Administered 2022-11-30: 11 [IU] via SUBCUTANEOUS

## 2022-11-29 MED ORDER — INSULIN GLARGINE-YFGN 100 UNIT/ML ~~LOC~~ SOLN
14.0000 [IU] | Freq: Every day | SUBCUTANEOUS | Status: DC
Start: 2022-11-29 — End: 2022-11-29

## 2022-11-29 MED ORDER — ENOXAPARIN SODIUM 40 MG/0.4ML IJ SOSY
40.0000 mg | PREFILLED_SYRINGE | INTRAMUSCULAR | Status: DC
  Administered 2022-11-30 – 2022-12-01 (×2): 40 mg via SUBCUTANEOUS
  Filled 2022-11-29 (×2): qty 0.4

## 2022-11-29 MED ORDER — SODIUM CHLORIDE 0.9% FLUSH: 3.0000 mL | INTRAVENOUS | Status: DC | PRN

## 2022-11-29 MED ORDER — SERTRALINE HCL 25 MG PO TABS
150.0000 mg | ORAL_TABLET | Freq: Every day | ORAL | Status: DC
  Administered 2022-11-29: 150 mg via ORAL
  Filled 2022-11-29: qty 2

## 2022-11-29 MED ORDER — GUAIFENESIN-DM 100-10 MG/5ML PO SYRP
5.0000 mL | ORAL_SOLUTION | ORAL | Status: DC | PRN
  Administered 2022-11-29: 5 mL via ORAL
  Filled 2022-11-29 (×2): qty 5

## 2022-11-29 NOTE — Progress Notes (Signed)
Transition of Care [facility]) - Inpatient Brief Assessment   Patient Details  Name: [name] MRN: [id number] Date of Birth: [birth year]  [facility] of [facility]) CM/SW Contact:    Janae Bridgeman, RN Phone Number: 11/29/2022, 11:26 AM   Clinical Narrative: CM noted that patient admitted to the hospital for hypoglycemia.  The patient is currently homeless.  PCP provider is Dr. Delford Field.  Resources provided in the AVS including shelter, food insecurity and smoking cessation.  TOC provided resources - no other needs at this time.   Transition of Care Asessment: Insurance and Status: (P) Insurance coverage has been reviewed Patient has primary care physician: (P) Yes Home environment has been reviewed: (P) Homeless Prior level of function:: (P) Independent Prior/Current Home Services: (P) No current home services Social Determinants of Health Reivew: (P) SDOH reviewed interventions complete Readmission risk has been reviewed: (P) Yes Transition of care needs: (P) no transition of care needs at this time

## 2022-11-29 NOTE — ED Notes (Signed)
Spoke with floor and ready to receive patient

## 2022-11-29 NOTE — Plan of Care (Signed)
Patient alert/oriented X4. Patient compliant with medication administration and tolerated IV 10% dextrose. Patient has a flat affect along with being frustrated about not being able to control his blood sugars when he is outside the hospital. Patient properly covered with insulin, VSS, no complaints at this time.   Problem: Education: Goal: Ability to describe self-care measures that may prevent or decrease complications (Diabetes Survival Skills Education) will improve Outcome: Progressing   Problem: Education: Goal: Individualized Educational Video(s) Outcome: Progressing   Problem: Coping: Goal: Ability to adjust to condition or change in health will improve Outcome: Progressing   Problem: Fluid Volume: Goal: Ability to maintain a balanced intake and output will improve Outcome: Progressing   Problem: Health Behavior/Discharge Planning: Goal: Ability to identify and utilize available resources and services will improve Outcome: Progressing   Problem: Health Behavior/Discharge Planning: Goal: Ability to manage health-related needs will improve Outcome: Progressing   Problem: Metabolic: Goal: Ability to maintain appropriate glucose levels will improve Outcome: Progressing   Problem: Nutritional: Goal: Maintenance of adequate nutrition will improve Outcome: Progressing   Problem: Nutritional: Goal: Progress toward achieving an optimal weight will improve Outcome: Progressing   Problem: Skin Integrity: Goal: Risk for impaired skin integrity will decrease Outcome: Progressing   Problem: Tissue Perfusion: Goal: Adequacy of tissue perfusion will improve Outcome: Progressing

## 2022-11-29 NOTE — Hospital Course (Addendum)
HPI N/V/D Poor PO intake R chest wall pain Sugar keeps dropping, gave D50 now on D10 Didn't use lantus last night but did use yesterday  About a week ago had a cold and yesterday the medicines he had taken were coming up and out, had v/d Took a;la seltzer pills, tablets, robitussin all at once and over the course of a few days Had some sweats Did take lantus yesterday but not short Doesn't take short acting if cbg less than 100  Appetite has not been good over the last week while ill. Doesn't want to think about food. Last time he ate yesterday morning, had some eggs and juice.   No fever, chills after he is sweaty Normal diarrhea Had productive cough, shob with the cold. Still feels like he has productive cough.    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  Psych consult? RPR Diabetes educator/RD consult TOC consult  Cp/r abd pain mostly when coughs but guarding, worse after bm, not a problem with pressure w stethescope

## 2022-11-29 NOTE — H&P (Signed)
Date: 11/29/2022               Patient Name:  [name] MRN: [id number]  DOB: 30-Jul-1985 Age / Sex: 37 y.o., male   PCP: Storm Frisk, MD         Medical Service: Internal Medicine Teaching Service         Attending Physician: Dr. Inez Catalina, MD    First Contact: Cecilio Asper, OMS-4 Pager: [phone]  Second Contact: Dr. Rana Snare Pager: [phone]       After Hours (After 5p/  First Contact Pager: [phone]  weekends / holidays): Second Contact Pager: [phone]   Chief Complaint: N/V/D  History of Present Illness: [name] is a 37 y.o. M with PMH T1DM, HIV, anxiety, depression, unhomed who presented to [facility] ED with complaint of nausea/vomiting/diarrhea found to have hypoglycemia and poor PO intake.  He explains that last week he developed cold symptoms including a productive cough, mild shortness of breath. He has had poor appetite and inadequate PO intake in this time as well. Because his CBG in ED continued to decrease, he was given D50 and started on D10. He did take his lantus yesterday but did not take humalog. He explains that he does not take humalog if his blood sugar is 100 or less.  It is not very clear how much of cold medicines he took yesterday alone versus over the last 1 week or both, but he explains he has been taking multiple cold medications for symptom relief. Yesterday he developed nausea, vomiting, and diarrhea, which he thinks is a result of the medications he has been taking. He has not had fever but some chills. Appetite has been very poor while acutely ill and he doesn't want to think about food. His last meal was yesterday morning and consisted of eggs and juice.  There were no signs of blood in the vomiting or diarrhea. He has some sharp chest wall pain on the R as well as R side of his abdomen from coughing. He states he noticed the abdominal pain when he had onset of diarrhea but does not know if it was present before.  When  asked about his depression and history of SI, he states "every time I'm here you guys ask me about that." When I explain that we ask so that we can offer help, he states "there's nothing you can do to help," and explains that with his cycle of illness and difficulty holding a job and recurrent hospitalizations, he stays feeling depressed.   Meds: Last took yesterday morning. Current Meds  Medication Sig   Aspirin Effervescent (ALKA-SELTZER EXTRA STRENGTH PO) Take 1 tablet by mouth daily as needed (Cold sx).   DM-Doxylamine-Acetaminophen (NYQUIL COLD & FLU PO) Take 1 Dose by mouth every 6 (six) hours as needed (Cold sx).   elvitegravir-cobicistat-emtricitabine-tenofovir (GENVOYA) 150-150-200-10 MG TABS tablet Take 1 tablet by mouth daily with breakfast.   guaifenesin (ROBITUSSIN) 100 MG/5ML syrup Take 200 mg by mouth 3 (three) times daily as needed for cough.   hydrALAZINE (APRESOLINE) 25 MG tablet Take 1 tablet (25 mg total) by mouth every 8 (eight) hours. For hypertension (Patient taking differently: Take 25 mg by mouth daily.)   insulin glargine (LANTUS SOLOSTAR) 100 UNIT/ML Solostar Pen Inject 18 Units into the skin daily. Make an appt with Dr. Delford Field for more refills.   insulin lispro (HUMALOG) 100 UNIT/ML KwikPen Inject 1-6 Units into the skin as directed. ONLY  with food - 1 unit per 15 grams of carbohydrates (bread, pasta, fruit, etc)   mirtazapine (REMERON) 15 MG tablet Take 0.5 tablets (7.5 mg total) by mouth at bedtime.   sertraline (ZOLOFT) 100 MG tablet Take 1.5 tablets (150 mg total) by mouth daily.   valsartan-hydrochlorothiazide (DIOVAN-HCT) 320-25 MG tablet Take 1 tablet by mouth daily.   Allergies: Allergies as of 11/29/2022 - Review Complete 11/29/2022  Allergen Reaction Noted   Shellfish allergy Anaphylaxis and Hives 04/13/2012   Aspirin Other (See Comments) 07/08/2019   Past Medical History: T1DM Asthma Depression Anxiety HIV CKD stage IIIa Hx "mental health issues,"  SI in June Unhoused  Past Surgical History: Past Surgical History:  Procedure Laterality Date   FRACTURE SURGERY     IM NAILING TIBIA Right 11/01/2017   INTRAMEDULLARY (IM) NAIL TIBIALRightGeneral   ORIF ANKLE FRACTURE Right 11/01/2017   ORIF ANKLE FRACTURE Right 11/01/2017   Procedure: OPEN REDUCTION INTERNAL FIXATION (ORIF) ANKLE FRACTURE;  Surgeon: Roby Lofts, MD;  Location: [facility] OR;  Service: Orthopedics;  Laterality: Right;   TIBIA IM NAIL INSERTION Right 11/01/2017   Procedure: INTRAMEDULLARY (IM) NAIL TIBIAL;  Surgeon: Roby Lofts, MD;  Location: [facility] OR;  Service: Orthopedics;  Laterality: Right;   Family History:  Family History  Problem Relation Age of Onset   Thyroid disease Mother    Diabetes Mother    Breast cancer Maternal Grandmother    Diabetes Maternal Grandmother    Social History: Works at [employer] in [place]. Currently living in his car. Independent in ADLs and IADLs. Dr. Delford Field is his PCP. Denies alcohol or tobacco use, is working to cut back/quit marijuana use.   Review of Systems: A complete ROS was negative except as per HPI.   Physical Exam: Blood pressure (!) 147/89, pulse 83, temperature (!) 97.4 F (36.3 C), temperature source Oral, resp. rate 18, height 6' (1.829 m), SpO2 100%. Constitutional:Appears stated age, lethargic, resting in bed. In no acute distress. Eyes: Cardio:Regular rate and rhythm. No murmurs, rubs, or gallops. Clinically euvolemic. Pulm:Clear to auscultation bilaterally. Normal work of breathing on room air. Has productive cough intermittently throughout my exam. Abdomen:Soft, tender to palpation on R side with guarding. DGU:[id number] for extremity edema. Skin:Warm and dry. Neuro:Alert and oriented x3. No focal deficit noted. Psych:Flat affect. Anhedonia? Pleasant nonetheless.   EKG: personally reviewed my interpretation is NSR with possible notching of P wave noted in lead II which was present but more subtle on EKG  10/30/2022.  CXR: personally reviewed my interpretation is no acute abnormalities.  Assessment & Plan by Problem: Active Problems:   Hypoglycemia  Type 1 diabetes mellitus Hypoglycemia HbA1c 8.6% 10/2022. On chart review, since August patient has had 2 hospital evaluations/admissions where he was hypoglycemic and the presentations seem consistent with this time to an extent--poor PO intake but continuing to take insulin, was not using short-acting insulin appropriately. He does admit that he doesn't know how to carb count correctly and it makes managing his diabetes difficult. I feel that he would benefit from a simplified regimen if at all possible. His diabetes is unfortunately negatively impacting several aspects of his life including his mental health, ability to maintain a job.  Plan: -Continue D10 until patient has adequate PO intake -CBG q4h -Resume home lantus at 14 units daily (down from 18) -SSI TID w/ meals -Diabetes coordinator, registered dietician consulted  Depression Anxiety He denies active SI, stating that he is "too tired to think about that" right now.  He did take several medications for his cold symptoms but I do not get the sense that he took them simultaneously in an attempt at self harm. I do worry that his psychiatric conditions are contributing to his disease management (a cycle as his disease worsens his depression). Plan: -Psychiatry consulted -Continue home mirtazapine 7.5 mg daily at bedtime, sertraline 150 mg daily  HTN Mildly hypertensive at this time. OP regimen includes valsartan-hydrochlorothiazide 320-25 mg daily, hydralazine 25 mg TID though he only takes this once daily. Last took medications yesterday morning. Plan: -Continue home valsartan-hydrochlorothiazide 320-25 mg daily, hold hydralazine for now  Upper respiratory infection Symptoms have been present for a week. CXR was normal, no fever, no leukocytosis, HDS. When checked in June his HIV was  under good control. I suspect this is viral in nature and that his residual symptoms including cough will gradually improve. Likely that acute GI symptoms are related as well. Plan: -F/u RVP -Robitussin  Hx HIV Labs last checked in June showed HIV undetectable, CD4 count 732. Recently treated for syphilis (3 injections of penicillin G). On genvoya as OP. Plan: -Continue genvoya  CKD stage 3a Renal function stable at this time with serum Cr 1.38, GFR >60. Plan: -Trend renal function  EKG findings Clinically euvolemic, I do not suspect overt heart failure but with EKG findings concerning for possible atrial enlargement, I do think it is reasonable to get an echocardiogram to assess cardiac anatomy/function. Plan: -Will obtain echocardiogram  Unhomed Has been living in his car for several months now. Plan: -TOC consult placed  Dispo: Admit patient to Observation with expected length of stay less than 2 midnights.  Signed: Champ Mungo, DO 11/29/2022, 8:23 AM  After 5pm on weekdays and 1pm on weekends: On Call pager: [phone]

## 2022-11-29 NOTE — Inpatient Diabetes Management (Signed)
Inpatient Diabetes Program Recommendations  AACE/ADA: New Consensus Statement on Inpatient Glycemic Control (2015)  Target Ranges:  Prepandial:   less than 140 mg/dL      Peak postprandial:   less than 180 mg/dL (1-2 hours)      Critically ill patients:  140 - 180 mg/dL   Lab Results  Component Value Date   GLUCAP 185 (H) 11/29/2022   HGBA1C 8.6 (H) 10/31/2022    Review of Glycemic Control  Latest Reference Range & Units 11/29/22 12:15  Glucose-Capillary 70 - 99 mg/dL 875 (H)  (H): Data is abnormally high Diabetes history: Type 1 DM Outpatient Diabetes medications: Lantus 18 units every day, Humalog 1-6 units TID Current orders for Inpatient glycemic control: Novolog 0-15 units TID, Semglee 14 units QD  Inpatient Diabetes Program Recommendations:    Consider reducing correction to Novolog 0-6 units TID. Secure message sent to MD.  Spoke with patient at length regarding outpatient diabetes management. Patient does not have consistent meal intake, can count CHO however, struggles given the inconsistencies of homelessness. Reports multiple episodes of hypoglycemia and is very tearful at bedside. Reviewed patient's current A1c of 8.6%. Explained what a A1c is and what it measures. Also reviewed goal A1c with patient, importance of good glucose control @ home, and blood sugar goals. Reviewed reasons for lower A1C, hypoglycemia unawareness, autonomic neuropathy, survival skills, interventions, CGM, current inpatient doses vs outpatient doses, multiple admissions/presentation for hypoglycemia, 70/30, insurance status, permissive hyperglycemia, and other comorbidites.  Patient is interested in CGM, order received. Will place on 10/2.   Thanks, Lujean Rave, MSN, RNC-OB Diabetes Coordinator [phone] (8a-5p)

## 2022-11-29 NOTE — ED Notes (Signed)
ED TO INPATIENT HANDOFF REPORT  ED Nurse Name and Phone #: Belisa Eichholz [phone]  S Name/Age/Gender [name] 37 y.o. male Room/Bed: 026C/026C  Code Status   Code Status: Do not attempt resuscitation (DNR) - Comfort care  Home/SNF/Other Home Patient oriented to: self, place, time, and situation Is this baseline? Yes   Triage Complete: Triage complete  Chief Complaint Hypoglycemia [E16.2]  Triage Note Pt coming in via POV with complaint of emesis. Pt cbg 44 in triage. Pt reports right sided chest pain that he says has been there for about a week. Endorses n/v/d. Denies shortness of breath. Pt lethargic, alert and oriented x4.    Allergies Allergies  Allergen Reactions   Shellfish Allergy Anaphylaxis and Hives   Aspirin Other (See Comments)    Lump in armpit    Level of Care/Admitting Diagnosis ED Disposition     ED Disposition  Admit   Condition  --   Comment  Hospital Area: [facility] [[id number]]  Level of Care: Med-Surg [16]  May place patient in observation at [facility] or [facility] if equivalent level of care is available:: No  Covid Evaluation: Symptomatic Person Under Investigation (PUI) or recent exposure (last 10 days) *Testing Required*  Diagnosis: Hypoglycemia [366440]  Admitting Physician: Nena Polio  Attending Physician: Nena Polio          B Medical/Surgery History Past Medical History:  Diagnosis Date   Anxiety    Asthma    Closed displaced comminuted fracture of shaft of right tibia 11/01/2017   Closed fracture of base of fifth metatarsal bone of right foot 11/13/2017   Closed fracture of medial malleolus of right ankle 10/31/2017   Depression    Headache    "only w/stress" (11/01/2017)   HIV (human immunodeficiency virus infection) (HCC)    Hypoglycemia due to insulin 06/27/2022   Migraine    "a few/month" (11/01/2017)   Refusal of blood transfusions as patient is Jehovah's Witness     Seizure (HCC)    "only w/my low blood sugars"  (11/01/2017)   Type I diabetes mellitus (HCC)    Past Surgical History:  Procedure Laterality Date   FRACTURE SURGERY     IM NAILING TIBIA Right 11/01/2017   INTRAMEDULLARY (IM) NAIL TIBIALRightGeneral   ORIF ANKLE FRACTURE Right 11/01/2017   ORIF ANKLE FRACTURE Right 11/01/2017   Procedure: OPEN REDUCTION INTERNAL FIXATION (ORIF) ANKLE FRACTURE;  Surgeon: Roby Lofts, MD;  Location: [facility] OR;  Service: Orthopedics;  Laterality: Right;   TIBIA IM NAIL INSERTION Right 11/01/2017   Procedure: INTRAMEDULLARY (IM) NAIL TIBIAL;  Surgeon: Roby Lofts, MD;  Location: [facility] OR;  Service: Orthopedics;  Laterality: Right;     A IV Location/Drains/Wounds Patient Lines/Drains/Airways Status     Active Line/Drains/Airways     Name Placement date Placement time Site Days   Peripheral IV 10/30/22 20 G Left Antecubital 10/30/22  --  Antecubital  30   Peripheral IV 11/29/22 20 G Anterior;Right Forearm 11/29/22  0146  Forearm  less than 1            Intake/Output Last 24 hours No intake or output data in the 24 hours ending 11/29/22 3474  Labs/Imaging Results for orders placed or performed during the hospital encounter of 11/29/22 (from the past 48 hour(s))  CBG monitoring, ED     Status: Abnormal   Collection Time: 11/29/22  1:38 AM  Result Value Ref Range   Glucose-Capillary 44 (  LL) 70 - 99 mg/dL    Comment: Glucose reference range applies only to samples taken after fasting for at least 8 hours.  Lipase, blood     Status: None   Collection Time: 11/29/22  1:47 AM  Result Value Ref Range   Lipase 40 11 - 51 U/L    Comment: Performed at [facility], [address], [place], [place] [address]  Comprehensive metabolic panel     Status: Abnormal   Collection Time: 11/29/22  1:47 AM  Result Value Ref Range   Sodium 141 135 - 145 mmol/L   Potassium 3.9 3.5 - 5.1 mmol/L   Chloride 105 98 - 111 mmol/L   CO2 21 (L) 22 - 32 mmol/L    Glucose, Bld 38 (LL) 70 - 99 mg/dL    Comment: CRITICAL RESULT CALLED TO, READ BACK BY AND VERIFIED WITH M. BERKLEY RN 11/29/22 @0224  BY J.WHITE Glucose reference range applies only to samples taken after fasting for at least 8 hours.    BUN 13 6 - 20 mg/dL   Creatinine, Ser 8.11 (H) 0.61 - 1.24 mg/dL   Calcium 9.7 8.9 - 91.4 mg/dL   Total Protein 7.3 6.5 - 8.1 g/dL   Albumin 3.8 3.5 - 5.0 g/dL   AST 21 15 - 41 U/L   ALT 17 0 - 44 U/L   Alkaline Phosphatase 62 38 - 126 U/L   Total Bilirubin 0.5 0.3 - 1.2 mg/dL   GFR, Estimated >78 >29 mL/min    Comment: (NOTE) Calculated using the CKD-EPI Creatinine Equation (2021)    Anion gap 15 5 - 15    Comment: Performed at [facility], [address], [place], [place] [address]  CBC     Status: None   Collection Time: 11/29/22  1:47 AM  Result Value Ref Range   WBC 7.2 4.0 - 10.5 K/uL   RBC 5.07 4.22 - 5.81 MIL/uL   Hemoglobin 15.6 13.0 - 17.0 g/dL   HCT 08.6 57.8 - 46.9 %   MCV 90.9 80.0 - 100.0 fL   MCH 30.8 26.0 - 34.0 pg   MCHC 33.8 30.0 - 36.0 g/dL   RDW 62.9 52.8 - 41.3 %   Platelets 292 150 - 400 K/uL   nRBC 0.0 0.0 - 0.2 %    Comment: Performed at [facility], [address], [place], [place] [address]  CBG monitoring, ED     Status: Abnormal   Collection Time: 11/29/22  1:54 AM  Result Value Ref Range   Glucose-Capillary 44 (LL) 70 - 99 mg/dL    Comment: Glucose reference range applies only to samples taken after fasting for at least 8 hours.   Comment 1 Document in Chart   CBG monitoring, ED     Status: Abnormal   Collection Time: 11/29/22  2:26 AM  Result Value Ref Range   Glucose-Capillary 211 (H) 70 - 99 mg/dL    Comment: Glucose reference range applies only to samples taken after fasting for at least 8 hours.  CBG monitoring, ED     Status: Abnormal   Collection Time: 11/29/22  3:20 AM  Result Value Ref Range   Glucose-Capillary 159 (H) 70 - 99 mg/dL    Comment: Glucose reference range applies only to  samples taken after fasting for at least 8 hours.  POC CBG, ED     Status: None   Collection Time: 11/29/22  5:36 AM  Result Value Ref Range  Glucose-Capillary 76 70 - 99 mg/dL    Comment: Glucose reference range applies only to samples taken after fasting for at least 8 hours.  POC CBG, ED     Status: Abnormal   Collection Time: 11/29/22  6:37 AM  Result Value Ref Range   Glucose-Capillary 65 (L) 70 - 99 mg/dL    Comment: Glucose reference range applies only to samples taken after fasting for at least 8 hours.  POC CBG, ED     Status: None   Collection Time: 11/29/22  7:40 AM  Result Value Ref Range   Glucose-Capillary 84 70 - 99 mg/dL    Comment: Glucose reference range applies only to samples taken after fasting for at least 8 hours.   DG Chest 2 View  Result Date: 11/29/2022 CLINICAL DATA:  Cough EXAM: CHEST - 2 VIEW COMPARISON:  10/01/2022 FINDINGS: Normal heart size and mediastinal contours. No acute infiltrate or edema. No effusion or pneumothorax. No acute osseous findings. IMPRESSION: Negative chest. Electronically Signed   By: Tiburcio Pea M.D.   On: 11/29/2022 05:46    Pending Labs Unresulted Labs (From admission, onward)     Start     Ordered   11/30/22 0500  CBC  Tomorrow morning,   R        11/29/22 0822   11/30/22 0500  Basic metabolic panel  Tomorrow morning,   R        11/29/22 0822   11/29/22 0417  Resp panel by RT-PCR (RSV, Flu A&B, Covid) Anterior Nasal Swab  Once,   URGENT        11/29/22 0416   11/29/22 0144  Urinalysis, Routine w reflex microscopic -Urine, Clean Catch  Once,   URGENT       Question:  Specimen Source  Answer:  Urine, Clean Catch   11/29/22 0144            Vitals/Pain Today's Vitals   11/29/22 0400 11/29/22 0500 11/29/22 0539 11/29/22 0800  BP: (!) 159/116 (!) 147/89  (!) 165/108  Pulse: 69 83  73  Resp: 18 18    Temp:   (!) 97.4 F (36.3 C)   TempSrc:   Oral   SpO2: 100% 100%  100%  Height:      PainSc:        Isolation  Precautions No active isolations  Medications Medications  sodium chloride flush (NS) 0.9 % injection 3 mL (has no administration in time range)  sodium chloride flush (NS) 0.9 % injection 3 mL (has no administration in time range)  0.9 %  sodium chloride infusion (has no administration in time range)  dextrose 10 % infusion ( Intravenous New Bag/Given 11/29/22 0652)  rivaroxaban (XARELTO) tablet 10 mg (has no administration in time range)  ondansetron (ZOFRAN) injection 4 mg (4 mg Intravenous Given 11/29/22 0148)  dextrose 50 % solution 50 mL (50 mLs Intravenous Given by Other 11/29/22 0215)  metoCLOPramide (REGLAN) injection 10 mg (10 mg Intravenous Given 11/29/22 0442)  ketorolac (TORADOL) 15 MG/ML injection 15 mg (15 mg Intravenous Given 11/29/22 0442)    Mobility walks     Focused Assessments    R Recommendations: See Admitting Provider Note  Report given to:   Additional Notes:

## 2022-11-29 NOTE — Consult Note (Signed)
[facility] Psychiatry New Face-to-Face Psychiatric Evaluation   Service Date: November 29, 2022 LOS:  LOS: 0 days    Assessment  [name] is a 37 y.o. male admitted medically for 11/29/2022  1:35 AM for hypoglycemia with poor PO intake. He carries the psychiatric diagnoses of MDD, recurrent, severe and anxiety and has a past medical history of T1DM, HIV  .Psychiatry was consulted for depression with passive SI by Champ Mungo, DO.    His current presentation of dysphoria with passive SI and hopelessness is most consistent with MDD. He meets criteria for MDD based on presentation and history.  Current outpatient psychotropic medications include Zoloft 150mg  daily and Remeron 7.5mg  qhs and historically he has had a minimal response to these medications. He was  compliant with medications prior to admission as evidenced by patient endorsement. On initial examination, patient is tearful, endorsing hopelessness about his future, as well as feeling overwhelmed by his chronic illnesses. Patient was reassessed in the afternoon as he did not wish to continue assessment in the AM, but was initially refusing to answer questions regarding SI. Upon reassessment patient denies active SI but endorsed continued chronic SI w/o plan. Patient endorsed that he was willing to try inpatient psych hospitalization as he has not tried this before and feels that his current medications are not beneficial. Patient has been doing his best to keep up with his medical appts, but continues to feel as though he is making very little progress and is struggling with the will to live.  Please see plan below for detailed recommendations.   Diagnoses:  Active Hospital problems: Active Problems:   Hypoglycemia     Plan  ## Safety and Observation Level:  - Based on my clinical evaluation, I estimate the patient to be at moderate risk of self harm in the current setting - At this time, we recommend a close level of  observation. This decision is based on my review of the chart including patient's history and current presentation, interview of the patient, mental status examination, and consideration of suicide risk including evaluating suicidal ideation, plan, intent, suicidal or self-harm behaviors, risk factors, and protective factors. This judgment is based on our ability to directly address suicide risk, implement suicide prevention strategies and develop a safety plan while the patient is in the clinical setting. Please contact our team if there is a concern that risk level has changed.   ## Medications:  Patient is a bit reluctant to have medication adjustments at this time, but endorses that he is willing to go to a inpatient psych hospital. Patient is not currently interested in therapy. At this time will continue outpatient medication regimen, to help patient feel in more control. Patient endorses feeling that not much is within his control, and would like to help patient feel some autonomy. Patient has made decision to go inpatient psych voluntarily.  -- Continue home Zoloft 150mg  daily -- Continue home mirtazapine 7.5mg  qhs  ## Medical Decision Making Capacity:  Not formally assessed  ## Further Work-up:  -- Per primary    -- most recent EKG on 11/29/2022 had QtC of 412 NSR -- Pertinent labwork reviewed earlier this admission includes:  Lab Results  Component Value Date   HGBA1C 8.6 (H) 10/31/2022   HGBA1C 8.5 (H) 10/02/2022   HGBA1C 8.8 (A) 08/16/2022       Latest Ref Rng & Units 11/29/2022    1:47 AM 10/30/2022    7:22 PM 10/02/2022  3:11 AM  CMP  Glucose 70 - 99 mg/dL 38  696  295   BUN 6 - 20 mg/dL 13  17  15    Creatinine 0.61 - 1.24 mg/dL 2.84  1.32  4.40   Sodium 135 - 145 mmol/L 141  139  132   Potassium 3.5 - 5.1 mmol/L 3.9  3.9  4.1   Chloride 98 - 111 mmol/L 105  103  99   CO2 22 - 32 mmol/L 21  24  23    Calcium 8.9 - 10.3 mg/dL 9.7  9.7  9.2   Total Protein 6.5 - 8.1 g/dL  7.3  6.8    Total Bilirubin 0.3 - 1.2 mg/dL 0.5  0.6    Alkaline Phos 38 - 126 U/L 62  49    AST 15 - 41 U/L 21  18    ALT 0 - 44 U/L 17  18       ## Disposition:  -- Inpatient psych  ## Behavioral / Environmental:  -- Close observation  ##Legal Status   Thank you for this consult request. Recommendations have been communicated to the primary team.  We will continue to monitor at this time.   Bobbye Morton, MD   NEW  history  Relevant Aspects of Hospital Course:  [name] is a 37 y.o. male admitted medically for 11/29/2022  1:35 AM for hypoglycemia with poor PO intake. He carries the psychiatric diagnoses of MDD, recurrent, severe and anxiety and has a past medical history of T1DM, HIV  .Psychiatry was consulted for depression with passive SI by Champ Mungo, DO.   Patient Report:   Patient endorses significant depression related to his chronic illness. Patient reports that he does not feel like he has control over himself due to the illness he has and also feels judged  and mistreated by others as a result of his HIV dx. Patient endorses that his HIV dx. Is especially traumatic given how he contracted the disease and the fact that he cannot "forget" or escape the diagnosis given he has to let potential partners know. Patient is tearful during interaction and endorses that he does not want to really live due to his inability to feel in control, but does not out right endose plans to harm self. Patient reports that he takes all of his home medications as prescribed.   Patient reports that he is not really sure what his expectations should be and struggles with thinking about the future. Patient denies active SI, but endorses that he has frequent passive SI w/o plan. Patient does not endorse HI or AVH on assessment. Patient endorses feeling isolated due to his illness and depression. Patient endorses that he does not feel that medication has been helpful for his depression and  would like to hear other options. It was discussed with patient choices such as inpatient psych, opt therapy, or some studies about alternative medications for patients with similar demographics. Patient discussed not feeling hopeful about therapy and reported that this sounds more traumatic as he does not like having to talk, think or address his medical issues. Patient endorsed interest at inpatient and reported that he felt like he has little control over decsions regarding his health, but was willing to go inpatient voluntarily. Patient endorsed that unlike last presentation he is no longer hopeful about his job, and these was not much outside of the hospital that would lead to him leaving before recommended by healthcare team. Patient endorsed he  was willing to stay for medical care at this time.   Patient did reports that he also feels that he has been depressed but does not feel that anything can be done for this as he feels it directly relates to his diagnosis and they are both irreversible.    Psychiatric History:  Information collected from EMR INPT: none OPT: none Is following with OPT PCP who refills Sertraline 150mg  daily  Family psych history: unknown    Tobacco use: not assessed at this time Alcohol use:  not assessed at this time Drug use:  not assessed at this time  Family History:   The patient's family history includes Breast cancer in his maternal grandmother; Diabetes in his maternal grandmother and mother; Thyroid disease in his mother.  Medical History: Past Medical History:  Diagnosis Date   Anxiety    Asthma    Closed displaced comminuted fracture of shaft of right tibia 11/01/2017   Closed fracture of base of fifth metatarsal bone of right foot 11/13/2017   Closed fracture of medial malleolus of right ankle 10/31/2017   Depression    Headache    "only w/stress" (11/01/2017)   HIV (human immunodeficiency virus infection) (HCC)    Hypoglycemia due to insulin  06/27/2022   Migraine    "a few/month" (11/01/2017)   Refusal of blood transfusions as patient is Jehovah's Witness    Seizure (HCC)    "only w/my low blood sugars"  (11/01/2017)   Type I diabetes mellitus (HCC)     Surgical History: Past Surgical History:  Procedure Laterality Date   FRACTURE SURGERY     IM NAILING TIBIA Right 11/01/2017   INTRAMEDULLARY (IM) NAIL TIBIALRightGeneral   ORIF ANKLE FRACTURE Right 11/01/2017   ORIF ANKLE FRACTURE Right 11/01/2017   Procedure: OPEN REDUCTION INTERNAL FIXATION (ORIF) ANKLE FRACTURE;  Surgeon: Roby Lofts, MD;  Location: [facility] OR;  Service: Orthopedics;  Laterality: Right;   TIBIA IM NAIL INSERTION Right 11/01/2017   Procedure: INTRAMEDULLARY (IM) NAIL TIBIAL;  Surgeon: Roby Lofts, MD;  Location: [facility] OR;  Service: Orthopedics;  Laterality: Right;    Medications:   Current Facility-Administered Medications:    0.9 %  sodium chloride infusion, 250 mL, Intravenous, PRN, Cardama, Amadeo Garnet, MD   Melene Muller ON 11/30/2022] elvitegravir-cobicistat-emtricitabine-tenofovir (GENVOYA) 150-150-200-10 MG tablet 1 tablet, 1 tablet, Oral, Q breakfast, Champ Mungo, DO   [START ON 11/30/2022] enoxaparin (LOVENOX) injection 40 mg, 40 mg, Subcutaneous, Q24H, August Saucer, Emily, DO   guaiFENesin-dextromethorphan (ROBITUSSIN DM) 100-10 MG/5ML syrup 5 mL, 5 mL, Oral, Q4H PRN, Champ Mungo, DO   irbesartan (AVAPRO) tablet 300 mg, 300 mg, Oral, Daily, 300 mg at 11/29/22 1024 **AND** hydrochlorothiazide (HYDRODIURIL) tablet 25 mg, 25 mg, Oral, Daily, Debe Coder B, MD, 25 mg at 11/29/22 1024   insulin aspart (novoLOG) injection 0-15 Units, 0-15 Units, Subcutaneous, TID WC, Champ Mungo, DO, 3 Units at 11/29/22 1326   insulin glargine-yfgn (SEMGLEE) injection 14 Units, 14 Units, Subcutaneous, Daily, Champ Mungo, DO   mirtazapine (REMERON) tablet 7.5 mg, 7.5 mg, Oral, QHS, Champ Mungo, DO   ondansetron [facility]) injection 4 mg, 4 mg, Intravenous, Q6H PRN, Champ Mungo, DO    [START ON 11/30/2022] sertraline (ZOLOFT) tablet 150 mg, 150 mg, Oral, Daily, Champ Mungo, DO   sodium chloride flush (NS) 0.9 % injection 3 mL, 3 mL, Intravenous, Q12H, Cardama, Amadeo Garnet, MD, 3 mL at 11/29/22 1326   sodium chloride flush (NS) 0.9 % injection 3 mL, 3 mL, Intravenous, PRN, Cardama,  Amadeo Garnet, MD  Allergies: Allergies  Allergen Reactions   Shellfish Allergy Anaphylaxis and Hives   Aspirin Other (See Comments)    Lump in armpit       Objective  Vital signs:  Temp:  [97.4 F (36.3 C)-98.7 F (37.1 C)] 98.7 F (37.1 C) (10/01 0924) Pulse Rate:  [63-83] 71 (10/01 0924) Resp:  [18-20] 18 (10/01 0924) BP: (147-185)/(89-120) 156/101 (10/01 0924) SpO2:  [98 %-100 %] 98 % (10/01 0924)  Psychiatric Specialty Exam:  Presentation  General Appearance:  Appropriate for Environment; Casual  Eye Contact: Poor (avoidant)  Speech: Clear and Coherent  Speech Volume: Decreased  Handedness:No data recorded  Mood and Affect  Mood: Depressed  Affect: Congruent; Depressed; Tearful   Thought Process  Thought Processes: Coherent  Descriptions of Associations:Intact  Orientation:Full (Time, Place and Person)  Thought Content:WDL  History of Schizophrenia/Schizoaffective disorder:No  Duration of Psychotic Symptoms:No data recorded Hallucinations:Hallucinations: None  Ideas of Reference:None  Suicidal Thoughts:Suicidal Thoughts: Yes, Passive SI Passive Intent and/or Plan: Without Plan  Homicidal Thoughts:Homicidal Thoughts: No   Sensorium  Memory: Immediate Good; Recent Fair  Judgment: Fair  Insight: Fair   Executive Functions  Concentration: Good  Attention Span: Good  Recall: Good  Fund of Knowledge: Good  Language: Good   Psychomotor Activity  Psychomotor Activity:Psychomotor Activity: Decreased; Psychomotor Retardation   Assets  Assets: Communication Skills; Desire for Improvement; Resilience   Sleep   Sleep:Sleep: Poor    Physical Exam: Physical Exam Review of Systems  Psychiatric/Behavioral:  Positive for depression, hallucinations and suicidal ideas.    Blood pressure (!) 156/101, pulse 71, temperature 98.7 F (37.1 C), temperature source Oral, resp. rate 18, height 6' (1.829 m), SpO2 98%. Body mass index is 26.01 kg/m.

## 2022-11-29 NOTE — ED Provider Notes (Addendum)
[place] EMERGENCY DEPARTMENT AT [facility] Provider Note  CSN: [id number] Arrival date & time: 11/29/22 0134  Chief Complaint(s) No chief complaint on file.  HPI [name] is a 37 y.o. male with a past medical history listed below including HIV, type 1 diabetes, homelessness who presents to the emergency department and reporting 1 to 2 days of nausea, vomiting, diarrhea.  Patient reports decreased oral tolerance over the past day.  States that he still uses insulin yesterday.  Reports that he did not check his blood sugar levels.  Endorsing right sided chest wall pain.  No shortness of breath.  No cough or congestion.  The history is provided by the patient.    Past Medical History Past Medical History:  Diagnosis Date   Anxiety    Asthma    Closed displaced comminuted fracture of shaft of right tibia 11/01/2017   Closed fracture of base of fifth metatarsal bone of right foot 11/13/2017   Closed fracture of medial malleolus of right ankle 10/31/2017   Depression    Headache    "only w/stress" (11/01/2017)   HIV (human immunodeficiency virus infection) (HCC)    Hypoglycemia due to insulin 06/27/2022   Migraine    "a few/month" (11/01/2017)   Refusal of blood transfusions as patient is Jehovah's Witness    Seizure (HCC)    "only w/my low blood sugars"  (11/01/2017)   Type I diabetes mellitus (HCC)    Patient Active Problem List   Diagnosis Date Noted   Hypoglycemia 10/01/2022   HIV (human immunodeficiency virus infection) (HCC) 10/01/2022   CKD (chronic kidney disease) stage 2, GFR 60-89 ml/min 10/01/2022   Homelessness 08/16/2022   Hypocalcemia 03/21/2022   Routine screening for STI (sexually transmitted infection) 02/03/2022   Essential hypertension 09/28/2020   History of suicidal ideation 02/09/2020   MDD (major depressive disorder), recurrent episode, severe (HCC) 01/15/2020   Syphilis 01/15/2019   HIV infection (HCC) 11/11/2014   Asthma  10/02/2010   Type 1 diabetes (HCC) 03/01/1991   Home Medication(s) Prior to Admission medications   Medication Sig Start Date End Date Taking? Authorizing Provider  Accu-Chek Softclix Lancets lancets Use to check blood sugar three times daily. 08/16/22   Storm Frisk, MD  acetaminophen (TYLENOL) 500 MG tablet Take 1 tablet (500 mg total) by mouth every 6 (six) hours as needed. 07/13/22   Redwine, Madison A, PA-C  Continuous Glucose Receiver (DEXCOM G7 RECEIVER) DEVI Use to check blood sugar continuously 08/16/22   Storm Frisk, MD  Continuous Glucose Sensor (DEXCOM G7 SENSOR) MISC Use to check blood sugar continuously. Change sensors once every 10 days. 08/16/22   Storm Frisk, MD  elvitegravir-cobicistat-emtricitabine-tenofovir (GENVOYA) 150-150-200-10 MG TABS tablet Take 1 tablet by mouth daily with breakfast. 09/14/22   Veryl Speak, FNP  glucose blood (ACCU-CHEK GUIDE) test strip Use to check blood sugar 3 times daily. 08/16/22   Storm Frisk, MD  hydrALAZINE (APRESOLINE) 25 MG tablet Take 1 tablet (25 mg total) by mouth every 8 (eight) hours. For hypertension 08/16/22   Storm Frisk, MD  insulin glargine (LANTUS SOLOSTAR) 100 UNIT/ML Solostar Pen Inject 18 Units into the skin daily. Make an appt with Dr. Delford Field for more refills. 11/11/22   Storm Frisk, MD  insulin lispro (HUMALOG) 100 UNIT/ML KwikPen Inject 1-6 Units into the skin as directed. ONLY with food - 1 unit per 15 grams of carbohydrates (bread, pasta, fruit, etc) 08/16/22   Shan Levans  E, MD  mirtazapine (REMERON) 15 MG tablet Take 0.5 tablets (7.5 mg total) by mouth at bedtime. 10/31/22 11/30/22  Hughie Closs, MD  sertraline (ZOLOFT) 100 MG tablet Take 1.5 tablets (150 mg total) by mouth daily. 08/16/22 12/14/22  Storm Frisk, MD  valsartan-hydrochlorothiazide (DIOVAN-HCT) 320-25 MG tablet Take 1 tablet by mouth daily. 08/16/22   Storm Frisk, MD  pravastatin (PRAVACHOL) 20 MG tablet Take 1  tablet (20 mg total) by mouth daily. Patient not taking: Reported on 03/31/2019 03/08/19 07/08/19  Claiborne Rigg, NP                                                                                                                                    Allergies Shellfish allergy and Aspirin  Review of Systems Review of Systems As noted in HPI  Physical Exam Vital Signs  I have reviewed the triage vital signs BP (!) 147/89   Pulse 83   Temp (!) 97.4 F (36.3 C) (Oral)   Resp 18   Ht 6' (1.829 m)   SpO2 100%   BMI 26.01 kg/m   Physical Exam Vitals reviewed.  Constitutional:      General: He is not in acute distress.    Appearance: He is well-developed. He is not diaphoretic.  HENT:     Head: Normocephalic and atraumatic.     Nose: Nose normal.  Eyes:     General: No scleral icterus.       Right eye: No discharge.        Left eye: No discharge.     Conjunctiva/sclera: Conjunctivae normal.     Pupils: Pupils are equal, round, and reactive to light.  Cardiovascular:     Rate and Rhythm: Normal rate and regular rhythm.     Heart sounds: No murmur heard.    No friction rub. No gallop.  Pulmonary:     Effort: Pulmonary effort is normal. No respiratory distress.     Breath sounds: Normal breath sounds. No stridor. No rales.  Chest:     Chest wall: Tenderness present.    Abdominal:     General: There is no distension.     Palpations: Abdomen is soft.     Tenderness: There is no abdominal tenderness.  Musculoskeletal:        General: No tenderness.     Cervical back: Normal range of motion and neck supple.  Skin:    General: Skin is warm and dry.     Findings: No erythema or rash.  Neurological:     Mental Status: He is alert and oriented to person, place, and time.     ED Results and Treatments Labs (all labs ordered are listed, but only abnormal results are displayed) Labs Reviewed  COMPREHENSIVE METABOLIC PANEL - Abnormal; Notable for the following components:       Result Value   CO2 21 (*)    Glucose,  Bld 38 (*)    Creatinine, Ser 1.38 (*)    All other components within normal limits  CBG MONITORING, ED - Abnormal; Notable for the following components:   Glucose-Capillary 44 (*)    All other components within normal limits  CBG MONITORING, ED - Abnormal; Notable for the following components:   Glucose-Capillary 44 (*)    All other components within normal limits  CBG MONITORING, ED - Abnormal; Notable for the following components:   Glucose-Capillary 211 (*)    All other components within normal limits  CBG MONITORING, ED - Abnormal; Notable for the following components:   Glucose-Capillary 159 (*)    All other components within normal limits  CBG MONITORING, ED - Abnormal; Notable for the following components:   Glucose-Capillary 65 (*)    All other components within normal limits  RESP PANEL BY RT-PCR (RSV, FLU A&B, COVID)  RVPGX2  LIPASE, BLOOD  CBC  URINALYSIS, ROUTINE W REFLEX MICROSCOPIC  CBG MONITORING, ED  CBG MONITORING, ED  CBG MONITORING, ED                                                                                                                         EKG  EKG Interpretation Date/Time:  Tuesday November 29 2022 01:45:54 EDT Ventricular Rate:  73 PR Interval:  134 QRS Duration:  82 QT Interval:  374 QTC Calculation: 412 R Axis:   37  Text Interpretation: Normal sinus rhythm Normal ECG When compared with ECG of 30-Oct-2022 20:26, PREVIOUS ECG IS PRESENT Confirmed by Drema Pry [phone]) on 11/29/2022 7:34:25 AM       Radiology DG Chest 2 View  Result Date: 11/29/2022 CLINICAL DATA:  Cough EXAM: CHEST - 2 VIEW COMPARISON:  10/01/2022 FINDINGS: Normal heart size and mediastinal contours. No acute infiltrate or edema. No effusion or pneumothorax. No acute osseous findings. IMPRESSION: Negative chest. Electronically Signed   By: Tiburcio Pea M.D.   On: 11/29/2022 05:46    Medications Ordered in ED Medications   sodium chloride flush (NS) 0.9 % injection 3 mL (has no administration in time range)  sodium chloride flush (NS) 0.9 % injection 3 mL (has no administration in time range)  0.9 %  sodium chloride infusion (has no administration in time range)  dextrose 10 % infusion ( Intravenous New Bag/Given 11/29/22 0652)  ondansetron (ZOFRAN) injection 4 mg (4 mg Intravenous Given 11/29/22 0148)  dextrose 50 % solution 50 mL (50 mLs Intravenous Given by Other 11/29/22 0215)  metoCLOPramide (REGLAN) injection 10 mg (10 mg Intravenous Given 11/29/22 0442)  ketorolac (TORADOL) 15 MG/ML injection 15 mg (15 mg Intravenous Given 11/29/22 0442)   Procedures Procedures  (including critical care time) Medical Decision Making / ED Course   Medical Decision Making Amount and/or Complexity of Data Reviewed Labs: ordered. Decision-making details documented in ED Course. Radiology: ordered and independent interpretation performed. Decision-making details documented in ED Course. ECG/medicine tests: ordered and independent interpretation performed. Decision-making details documented  in ED Course.  Risk Prescription drug management. Decision regarding hospitalization.    Right-sided chest pain most consistent with chest wall pain given tenderness.  EKG without acute ischemic changes or evidence of pericarditis.  Chest x-ray without evidence of pneumonia, pneumothorax, pulmonary edema pleural effusions.  No evidence of pneumomediastinum concerning for esophageal perforation.  Doubt PE.  Noted to be hypoglycemic.  Patient given D50 and oral intake.  Serial CBGs trending down.  Additional oral intake given and patient started on D10.  Labs notable with stable renal function.  No leukocytosis or anemia.  Likely due to decreased oral tolerance and use of long-acting insulin.  Will admit to medicine for further workup and management.      Final Clinical Impression(s) / ED Diagnoses Final diagnoses:  Hypoglycemia     This chart was dictated using voice recognition software.  Despite best efforts to proofread,  errors can occur which can change the documentation meaning.      Nira Conn, MD 11/29/22 [phone]

## 2022-11-29 NOTE — ED Triage Notes (Signed)
Pt coming in via POV with complaint of emesis. Pt cbg 44 in triage. Pt reports right sided chest pain that he says has been there for about a week. Endorses n/v/d. Denies shortness of breath. Pt lethargic, alert and oriented x4.

## 2022-11-30 ENCOUNTER — Inpatient Hospital Stay (HOSPITAL_COMMUNITY): Payer: MEDICAID

## 2022-11-30 DIAGNOSIS — F332 Major depressive disorder, recurrent severe without psychotic features: Secondary | ICD-10-CM

## 2022-11-30 LAB — GLUCOSE, CAPILLARY
Glucose-Capillary: 80 mg/dL (ref 70–99)
Glucose-Capillary: 308 mg/dL — ABNORMAL HIGH (ref 70–99)
Glucose-Capillary: 53 mg/dL — ABNORMAL LOW (ref 70–99)
Glucose-Capillary: 187 mg/dL — ABNORMAL HIGH (ref 70–99)

## 2022-11-30 LAB — BASIC METABOLIC PANEL
Anion gap: 11 (ref 5–15)
BUN: 23 mg/dL — ABNORMAL HIGH (ref 6–20)
CO2: 25 mmol/L (ref 22–32)
Calcium: 9.2 mg/dL (ref 8.9–10.3)
Chloride: 102 mmol/L (ref 98–111)
Creatinine, Ser: 1.88 mg/dL — ABNORMAL HIGH (ref 0.61–1.24)
GFR, Estimated: 47 mL/min — ABNORMAL LOW (ref >60)
Glucose, Bld: 77 mg/dL (ref 70–99)
Potassium: 4.2 mmol/L (ref 3.5–5.1)
Sodium: 138 mmol/L (ref 135–145)

## 2022-11-30 LAB — CBC
HCT: 42.6 % (ref 39.0–52.0)
Hemoglobin: 14.8 g/dL (ref 13.0–17.0)
MCH: 30.8 pg (ref 26.0–34.0)
MCHC: 34.7 g/dL (ref 30.0–36.0)
MCV: 88.6 fL (ref 80.0–100.0)
Platelets: 298 10*3/uL (ref 150–400)
RBC: 4.81 MIL/uL (ref 4.22–5.81)
RDW: 14.4 % (ref 11.5–15.5)
WBC: 7.6 10*3/uL (ref 4.0–10.5)
nRBC: 0 % (ref 0.0–0.2)

## 2022-11-30 MED ORDER — ENSURE ENLIVE PO LIQD
237.0000 mL | Freq: Two times a day (BID) | ORAL | Status: DC
  Administered 2022-11-30 – 2022-12-01 (×3): 237 mL via ORAL

## 2022-11-30 MED ORDER — MIRTAZAPINE 15 MG PO TABS: 15.0000 mg | ORAL_TABLET | Freq: Every day | ORAL | Status: DC

## 2022-11-30 MED ORDER — INSULIN GLARGINE-YFGN 100 UNIT/ML ~~LOC~~ SOLN
14.0000 [IU] | Freq: Every day | SUBCUTANEOUS | Status: DC
  Administered 2022-11-30 – 2022-12-01 (×2): 14 [IU] via SUBCUTANEOUS
  Filled 2022-11-30 (×3): qty 0.14

## 2022-11-30 MED ORDER — LACTATED RINGERS IV BOLUS
1000.0000 mL | Freq: Once | INTRAVENOUS | Status: AC
  Administered 2022-11-30: 1000 mL via INTRAVENOUS

## 2022-11-30 MED ORDER — ADULT MULTIVITAMIN W/MINERALS CH
1.0000 | ORAL_TABLET | Freq: Every day | ORAL | Status: DC
  Administered 2022-12-01: 1 via ORAL
  Filled 2022-11-30: qty 1

## 2022-11-30 MED ORDER — MIRTAZAPINE 15 MG PO TABS
7.5000 mg | ORAL_TABLET | Freq: Once | ORAL | Status: AC
  Administered 2022-11-30: 7.5 mg via ORAL
  Filled 2022-11-30: qty 1

## 2022-11-30 NOTE — Plan of Care (Signed)
Patient alert/oriented X4. Patient refused afternoon blood sugar reading and explained his frustration about being IVCed. Patient shows signs of minimal communication/flat affect. Droplet precautions maintained for Rhinovirus. VSS, 1:1 sitter remains in place, will continue to monitor. IVC paper faxed/copy in chart.  Problem: Education: Goal: Ability to describe self-care measures that may prevent or decrease complications (Diabetes Survival Skills Education) will improve Outcome: Progressing   Problem: Education: Goal: Individualized Educational Video(s) Outcome: Progressing   Problem: Coping: Goal: Ability to adjust to condition or change in health will improve Outcome: Progressing   Problem: Fluid Volume: Goal: Ability to maintain a balanced intake and output will improve Outcome: Progressing   Problem: Health Behavior/Discharge Planning: Goal: Ability to identify and utilize available resources and services will improve Outcome: Progressing   Problem: Health Behavior/Discharge Planning: Goal: Ability to manage health-related needs will improve Outcome: Progressing   Problem: Metabolic: Goal: Ability to maintain appropriate glucose levels will improve Outcome: Progressing   Problem: Nutritional: Goal: Maintenance of adequate nutrition will improve Outcome: Progressing   Problem: Nutritional: Goal: Progress toward achieving an optimal weight will improve Outcome: Progressing   Problem: Skin Integrity: Goal: Risk for impaired skin integrity will decrease Outcome: Progressing   Problem: Tissue Perfusion: Goal: Adequacy of tissue perfusion will improve Outcome: Progressing   Problem: Education: Goal: Knowledge of General Education information will improve Description: Including pain rating scale, medication(s)/side effects and non-pharmacologic comfort measures Outcome: Progressing   Problem: Health Behavior/Discharge Planning: Goal: Ability to manage health-related  needs will improve Outcome: Progressing   Problem: Clinical Measurements: Goal: Ability to maintain clinical measurements within normal limits will improve Outcome: Progressing   Problem: Clinical Measurements: Goal: Will remain free from infection Outcome: Progressing   Problem: Clinical Measurements: Goal: Diagnostic test results will improve Outcome: Progressing   Problem: Clinical Measurements: Goal: Respiratory complications will improve Outcome: Progressing   Problem: Clinical Measurements: Goal: Cardiovascular complication will be avoided Outcome: Progressing   Problem: Activity: Goal: Risk for activity intolerance will decrease Outcome: Progressing   Problem: Nutrition: Goal: Adequate nutrition will be maintained Outcome: Progressing   Problem: Coping: Goal: Level of anxiety will decrease Outcome: Progressing   Problem: Elimination: Goal: Will not experience complications related to bowel motility Outcome: Progressing   Problem: Elimination: Goal: Will not experience complications related to urinary retention Outcome: Progressing   Problem: Pain Managment: Goal: General experience of comfort will improve Outcome: Progressing   Problem: Safety: Goal: Ability to remain free from injury will improve Outcome: Progressing   Problem: Skin Integrity: Goal: Risk for impaired skin integrity will decrease Outcome: Progressing

## 2022-11-30 NOTE — Progress Notes (Signed)
Pt's BP 149/102, other VS WNL, asymptomatic. Pt previous BP's also elevated since ED admission, see flowsheets. Notified Dr. Florentina Addison Masters per above with no prn meds in [facility]. Pt did state that he does take apresoline when asked, dosage and time confirmed per med rx. Apresoline ordered and given. Rechecked BP was 156/101. Dr. Truitt Merle. Dr. Charline Bills that ok with bp being elevated throughout the night. No new orders at this time. Bed left in lowest position. Call bell in reach.

## 2022-11-30 NOTE — Progress Notes (Signed)
Initial Nutrition Assessment  DOCUMENTATION CODES:   Not applicable  INTERVENTION:  Provide Ensure Enlive po BID with meals, each supplement provides 350 kcal and 20 grams of protein.  Provide multivitamin with minerals po daily.  Provided "High Calorie, High Protein Nutrition Therapy" handout from the Academy of Nutrition and Dietetics. Encouraged adequate intake of calories and protein at meals. Encouraged intake of 3 meals daily. Discussed foods that are good sources of protein to include at meals.   RD provided "Carbohydrate Counting for People with Diabetes" handout and "Using Nutrition Labels: Carbohydrate"  handout from the Academy of Nutrition and Dietetics. Discussed different food groups and their effects on blood sugar, emphasizing carbohydrate-containing foods. Provided list of carbohydrates and recommended serving sizes of common foods to make sure pt is receiving adequate carbohydrate intake at meals in setting of history of hypoglycemia. Discussed method for carbohydrate counting for foods that do not have a label as pt reported having difficulty with this. Discussed importance of consistent intake and encouraged pt to eat 3 meals daily. Attempted to use teach-back method, but pt very withdrawn at this time.  NUTRITION DIAGNOSIS:   Inadequate oral intake related to decreased appetite as evidenced by per patient/family report.  GOAL:   Patient will meet greater than or equal to 90% of their needs  MONITOR:   PO intake, Supplement acceptance, Labs, Weight trends, I & O's  REASON FOR ASSESSMENT:   Malnutrition Screening Tool, Consult Diet education  ASSESSMENT:   37 year old male with PMHx of T1DM, HIV, anxiety, depression, who is currently unhomed admitted with nausea/vomiting/diarrhea found to have hypoglycemia and poor PO intake, CKD stage 3a.  Pt seen by psychology and per their note presentation of dysphoria with passive SI and hopelessness most consistent with  MDD.  Pt very quiet and withdrawn at time of RD assessment. He would answer some questions but difficult to obtain a good history. Pt reports his appetite was normal PTA. He denies having any difficulty obtaining food to this RD, but noted in H&P pt has been living in car and screened positive for food insecurity on social determinants screening. Case management provided resources to patient already. Pt reports he typically eats 2 meals per day. He may have cereal with milk or fruit for breakfast. For second meal he may have a sandwich. Reports he only drinks water. Denies food allergies or intolerances. Denies nausea, emesis, or abdominal pain at this time (though noted he had presented with N/V and diarrhea). Denies any difficulty with chewing/swallowing. Pt reports he has not been eating well here in the hospital because he is not hungry. No meal documentation recorded at this time and pt unsure how much of meals he has been finishing. Discussed importance of adequate intake at meals. Provided education and also pt agreeable to drinking Ensure supplements while admitted to help improve intake.  Per review of chart home insulin regimen was Lantus 18 units daily, and Humalog 1-6 units TID (only with food, 1 unit per 15 grams of carbohydrates). Pt reports he knows how to read a food label and determine the amount of carbs he is eating. He reports having a little more difficulty if he is eating food already prepared in determining carbs. Provided education on carbohydrate counting and reading a food label, though pt not very engaged in education.  Pt reports his UBW was 200 lbs (90.9 kg) and he is unsure if he has lost weight. No weight had been obtained yet this admission. RD  obtained bed scale weight of 84.8 kg. Per review of chart pt was 87.4 kg on 08/16/22. He has lost 2.6 kg or 3% weight over the past 3.5 months, which is not significant for time frame.  Medications reviewed and include: Novolog 0-15  units TID with meals, Semglee 14 units daily, Remeron 7.5 mg at bedtime, sertraline  Labs reviewed: CBG 80-308, Bun 23, Creatinine 1.88  No I/O documentation since admission to review  NUTRITION - FOCUSED PHYSICAL EXAM:  Flowsheet Row Most Recent Value  Orbital Region No depletion  Upper Arm Region No depletion  Thoracic and Lumbar Region No depletion  Buccal Region No depletion  Temple Region No depletion  Clavicle Bone Region Mild depletion  Clavicle and Acromion Bone Region Mild depletion  Scapular Bone Region No depletion  Dorsal Hand No depletion  Patellar Region No depletion  Anterior Thigh Region No depletion  Posterior Calf Region Mild depletion  Edema (RD Assessment) None  Hair Reviewed  Eyes Reviewed  Mouth Reviewed  Skin Reviewed  Nails Reviewed       Diet Order:   Diet Order             Diet regular Room service appropriate? Yes; Fluid consistency: Thin  Diet effective now                   EDUCATION NEEDS:   Education needs have been addressed  Skin:  Skin Assessment: Reviewed RN Assessment  Last BM:  11/28/22 per chart  Height:   Ht Readings from Last 1 Encounters:  11/29/22 6' (1.829 m)   Weight:   Wt Readings from Last 1 Encounters:  11/30/22 84.8 kg   Ideal Body Weight:  80.9 kg  BMI:  Body mass index is 25.35 kg/m.  Estimated Nutritional Needs:   Kcal:  2100-2300  Protein:  105-115 grams  Fluid:  2.1-2.3 L/day  Letta Median, MS, RD, LDN, CNSC Pager number available on Amion

## 2022-11-30 NOTE — TOC Initial Note (Signed)
Transition of Care [facility]) - Initial/Assessment Note    Patient Details  Name: [name] MRN: [id number] Date of Birth: 05-07-1985  [facility]) CM/SW Contact:    Gio Janoski A [place], Theresia Majors Phone Number: 11/30/2022, 3:30 PM  Clinical Narrative:                  CSW was notified by Psychiatry Medicine Team that pt had petition for IVC. CSW completed IVC paperwork and has been input into pt's chart. Pt to discharge to inpatient psychiatric facility once medically cleared. CSW to send referral once pt has stabilized.   IVC Case Info # [id number]  TOC will continue to follow.   Expected Discharge Plan: Psychiatric Hospital Barriers to Discharge: Continued Medical Work up   Patient Goals and CMS Choice            Expected Discharge Plan and Services       Living arrangements for the past 2 months: Apartment                                      Prior Living Arrangements/Services Living arrangements for the past 2 months: Apartment Lives with:: Self                   Activities of Daily Living   ADL Screening (condition at time of admission) Independently performs ADLs?: Yes (appropriate for developmental age) Does the patient have a NEW difficulty with bathing/dressing/toileting/self-feeding that is expected to last >3 days?: No Does the patient have a NEW difficulty with getting in/out of bed, walking, or climbing stairs that is expected to last >3 days?: No Does the patient have a NEW difficulty with communication that is expected to last >3 days?: No Is the patient deaf or have difficulty hearing?: No Does the patient have difficulty seeing, even when wearing glasses/contacts?: No Does the patient have difficulty concentrating, remembering, or making decisions?: No  Permission Sought/Granted                  Emotional Assessment   Attitude/Demeanor/Rapport: Unable to Assess Affect (typically observed): Unable to  Assess Orientation: : Oriented to Self, Oriented to Place, Oriented to  Time, Oriented to Situation Alcohol / Substance Use: Tobacco Use Psych Involvement: Yes (comment)  Admission diagnosis:  Hypoglycemia [E16.2] Patient Active Problem List   Diagnosis Date Noted   Hypoglycemia 10/01/2022   HIV (human immunodeficiency virus infection) (HCC) 10/01/2022   CKD (chronic kidney disease) stage 2, GFR 60-89 ml/min 10/01/2022   Homelessness 08/16/2022   Hypocalcemia 03/21/2022   Routine screening for STI (sexually transmitted infection) 02/03/2022   Essential hypertension 09/28/2020   History of suicidal ideation 02/09/2020   MDD (major depressive disorder), recurrent episode, severe (HCC) 01/15/2020   Syphilis 01/15/2019   HIV infection (HCC) 11/11/2014   Asthma 10/02/2010   Type 1 diabetes (HCC) 03/01/1991   PCP:  Storm Frisk, MD Pharmacy:   [id number] - [place], [place] - [address] AT [facility] OF [address] & [address] [place] [address] Phone: [phone] Fax: [phone]  [name] [address] [place] [address] Phone: [phone] Fax: [phone]  [facility] [address] [place] [address] Phone: [phone] Fax: [phone]  [id number] - [place], [place] -  [address] [place] [address] Phone: [phone] Fax: [phone]  [id number] - [place], [place] - [address] AT [facility] OF [address] [facility] [address] [place] [address] Phone: [phone] Fax: [phone]  [place] [facility] [address] [place] [address] Phone: [phone] Fax: [phone]     Social Determinants of Health (SDOH) Social History: SDOH Screenings   Food Insecurity: Food Insecurity Present  (11/29/2022)  Housing: High Risk (11/29/2022)  Transportation Needs: No Transportation Needs (11/29/2022)  Utilities: Patient Unable To Answer (11/29/2022)  Depression (PHQ2-9): High Risk (08/16/2022)  Tobacco Use: High Risk (11/29/2022)   SDOH Interventions:     Readmission Risk Interventions     No data to display

## 2022-11-30 NOTE — Progress Notes (Signed)
HD#1 SUBJECTIVE:  Patient Summary: [name] is a 37 y.o. with a pertinent PMH of T1DM, HIV, anxiety, and depression who presented with nausea, vomiting, and diarrhea and admitted for hypoglycemia.   Overnight Events: NAEO  Interim History: Patient was evaluated at bedside. Patient had flat affect and was minimally responsive. Denied any chest pain, nausea, vomiting, or other concerns.   OBJECTIVE:  Vital Signs: Vitals:   11/30/22 0038 11/30/22 0423 11/30/22 0838 11/30/22 1000  BP: (!) 156/101 (!) 152/105 (!) 146/94   Pulse: 85 69 84   Resp: 17 18    Temp: 97.8 F (36.6 C) 98 F (36.7 C) 98.3 F (36.8 C)   TempSrc: Oral Oral    SpO2: 98% 99% 100%   Weight:    84.8 kg  Height:       Supplemental O2: Room Air SpO2: 100 %  Filed Weights   11/30/22 1000  Weight: 84.8 kg    No intake or output data in the 24 hours ending 11/30/22 1517 Net IO Since Admission: No IO data has been entered for this period [11/30/22 1517]  Physical Exam: Physical Exam Constitutional:      Appearance: Normal appearance.  HENT:     Head: Normocephalic and atraumatic.  Cardiovascular:     Rate and Rhythm: Normal rate and regular rhythm.     Pulses: Normal pulses.     Heart sounds: Normal heart sounds.  Abdominal:     General: Abdomen is flat. Bowel sounds are normal.     Palpations: Abdomen is soft.  Neurological:     Mental Status: He is alert.  Psychiatric:        Attention and Perception: Attention normal.        Mood and Affect: Affect is flat.     Comments: Minimally responsive to questions    CBC    Component Value Date/Time   WBC 7.6 11/30/2022 0451   RBC 4.81 11/30/2022 0451   HGB 14.8 11/30/2022 0451   HCT 42.6 11/30/2022 0451   PLT 298 11/30/2022 0451   MCV 88.6 11/30/2022 0451   MCH 30.8 11/30/2022 0451   MCHC 34.7 11/30/2022 0451   RDW 14.4 11/30/2022 0451   LYMPHSABS 0.7 10/30/2022 1702   MONOABS 0.4 10/30/2022 1702   EOSABS 0.0 10/30/2022 1702    BASOSABS 0.0 10/30/2022 1702      Latest Ref Rng & Units 11/30/2022    4:51 AM 11/29/2022    1:47 AM 10/30/2022    7:22 PM  BMP  Glucose 70 - 99 mg/dL 77  38  401   BUN 6 - 20 mg/dL 23  13  17    Creatinine 0.61 - 1.24 mg/dL 0.27  2.53  6.64   Sodium 135 - 145 mmol/L 138  141  139   Potassium 3.5 - 5.1 mmol/L 4.2  3.9  3.9   Chloride 98 - 111 mmol/L 102  105  103   CO2 22 - 32 mmol/L 25  21  24    Calcium 8.9 - 10.3 mg/dL 9.2  9.7  9.7      ASSESSMENT/PLAN:  Assessment: Active Problems:   Hypoglycemia  Plan: # Type 1 diabetes mellitus #Hypoglycemia Presented with a glucose of 38. Last A1c in September 2024 was 8.6%.  Patient has had 2 presentations to the hospital since then when he was hypoglycemic; however likely secondary to poor oral intake.  Patient noted to have not been using short acting insulin appropriately, as well as  not understanding how to correctly carb count complicating his diabetes management.  Diabetes coordinator and dietician consulted.  Noted been taking Lantus 18 units at home. - Continue Lantus 14 units daily - SSI 3 times daily with meals  #AKI #CKD stage IIIa Presented with a creatinine of 1.38 and GFR greater than 60.  His baseline creatinine is ~1.25.  This morning, his creatinine increased to 1.88 and GFR decreased to 47.  This is likely in the context of decreased oral intake. - 1 L bolus of LR - Trend BMP - Encourage increased p.o. intake  #Depression #Anxiety Patient endorses chronic history of depression and is likely multifactorial with his chronic diseases.  Patient endorses frequent passive SI without a plan.  Psychiatry consulted and believe he is a good candidate for voluntary psychiatric hospitalization. Later in the afternoon, patient noted to have been considering leaving AMA, so psych was consulted for IVC.  - Continue home Zoloft 150 mg daily - Continue home mirtazapine 7.5 mg nightly  #HIV #History of syphilis Last labs were checked  in June in which his HIV was undetectable with a CD4 count of 732.  Noted to have been recently treated for syphilis in which she received 3 injections of penicillin G. - Continued home medication of Genvoya   #Hypertension Blood pressure elevated with systolic in the 150s.  Currently taking valsartan-hydrochlorothiazide 320-25 mg daily and hydralazine 25 mg 3 times daily.  Patient noted to have been taking hydralazine only once daily, however. - Continue home valsartan-hydrochlorothiazide - Continue to home hydralazine  #Rhinovirus Patient has endorsed symptoms of an upper respiratory tract infection over the past week.  He has been afebrile.  He has been hemodynamically stable with no leukocytosis.  Respiratory panel was positive for rhinovirus. - Continue Robitussin  Best Practice: Diet: Regular diet IVF: Fluids: LR, Rate:  1000 cc bolus VTE: enoxaparin (LOVENOX) injection 40 mg Start: 11/30/22 1000 Code:  DNR-Comfort AB: None Therapy Recs: N/A, DME: other N/A DISPO: Anticipated discharge tomorrow to Home pending  medication management .  Signature: Morrie Sheldon, MD Internal Medicine Resident, PGY-1 [name]  Pager: [phone]  Please contact the on call pager after 5 pm and on weekends at [phone].

## 2022-11-30 NOTE — Inpatient Diabetes Management (Addendum)
Inpatient Diabetes Program Recommendations  AACE/ADA: New Consensus Statement on Inpatient Glycemic Control (2015)  Target Ranges:  Prepandial:   less than 140 mg/dL      Peak postprandial:   less than 180 mg/dL (1-2 hours)      Critically ill patients:  140 - 180 mg/dL   Lab Results  Component Value Date   GLUCAP 308 (H) 11/30/2022   HGBA1C 8.6 (H) 10/31/2022    Review of Glycemic Control  Latest Reference Range & Units 11/29/22 16:11 11/29/22 21:49 11/30/22 06:50 11/30/22 08:37  Glucose-Capillary 70 - 99 mg/dL 409 (H) 811 (H) 80 914 (H)  (H): Data is abnormally high Diabetes history: Type 1 DM Outpatient Diabetes medications: Lantus 18 units every day, Humalog 1-6 units TID Current orders for Inpatient glycemic control: Novolog 0-15 units TID, Semglee 14 units QD   Inpatient Diabetes Program Recommendations:     Consider reducing correction to Novolog 0-6 units TID, Semglee 10 units every day, and Novolog 2 units TID (Assuming patient is consuming >50% of meals). Spoke with MD regarding recommendations and if patient to be IVC'd can titrate insulin as appropriate.  Spoke with patient again at bedside. Patient expresses frustration with potential IVC situation.  Reviewed recommendations with patient and MD team; patient in agreement. Reports "I ate two dinners last night to keep my sugar up." CBG this AM was 80 mg/dL.  Stressed importance of decreasing doses to promote safety, even if that means temporarily allowing some hyperglycemia (~180-200's without lows). Patient in agreement and feels that he can be successful with adjustments,  Attempted to place Dexcom x 2. Patient wants to wait until official decision is made for IVC.   Thanks, Lujean Rave, MSN, RNC-OB Diabetes Coordinator [phone] (8a-5p)

## 2022-11-30 NOTE — Consult Note (Addendum)
[name]   Service Date: November 30, 2022 LOS:  LOS: 1 day    [name] is a 37 y.o. male admitted medically for 11/29/2022  1:35 AM for hypoglycemia with poor PO intake. He carries the psychiatric diagnoses of MDD, recurrent, severe and anxiety and has a past medical history of T1DM, HIV  .Psychiatry was consulted for depression with passive SI by Champ Mungo, DO.      His current presentation of dysphoria with passive SI and hopelessness is most consistent with MDD. He meets criteria for MDD based on presentation and history.  Current outpatient psychotropic medications include Zoloft 150mg  daily and Remeron 7.5mg  qhs and historically he has had a minimal response to these medications. He was  compliant with medications prior to admission as evidenced by patient endorsement. Patient continues to present with MDD and concern that patient's psychomotor retardation and severe hopelessness are impacting patient's ability to care for self. Patient's presentation this AM and refusal of care for pain led to concern that patient may be isolating and decreasing participation in care possibly has as a means of self harm. Given patient's T1DM dx and hx of poor control, and new AKI his symptoms of headache and his occluded line are things that are reasonale concerns, but patient seems to have resigned to isolation at this time to cope with depressed mood.   Update: Patient did not respond well to 1:1 sitter that was placed due to concern that patient is withdrawing and may be attempting self-harm this way. Patient began to endorse wish to leave and per RN was calling for a ride. Patient has not been declared medically stable. RN endorsed that prior to sitter, patient also did not verbally communicate with him this AM. RN did try de-escalation techniques prior to provider coming to discuss possible IVC. Due to patient attempting to leave  and refusing care, while being high risk for SA he has been IVC'd. This was communicated to patient on re-eval prior to sending IVC. Patient continued to be irritated and asked that provider leave, provider endorsed that he may become increasingly agitated if this provider continued speaking with him.   2nd Update: Following notification that he was being IVC'd, patient did speak with his RN about waiting until about 1pm for attending provider to come, mentioned by writing provider during attempt to re-evaluate patient for IVC. Patient did wait for attending provider, and was very irritable towards writer, but patient endorsed he was willing to talk with attending provider. Patient endorsed being very frustrated with IVC. Patient endorsed that he did not like that his rights were taken, and endorsed he believed that he was IVC'd because he did not talk to provider on assessment this AM. Attending provider attempted to clarify multiple times that he was IVC'd after writing provider came and saw him the second time and patient endorsed he wanted to leave and did not care that he was not medically stable. Patient reiterated that he still does not care about being medical stable, and thinks that he should be allowed to leave with his kidney's declining and that he likely would not be able to manage his T1DM, as he feels he has failed at doing this in the past. However, patient A1c indicates patinet has been attempting managing. Patient also endorsed that he had agreed to outpatient psychiatry and did not wish to go inpatient which was different from this AM and yesterday.   Unfortunately patient  has lost some autonomy today as a result of endorsing wish to leave and per RN calling for a ride. Attempted de-escalation after sitter arrived was unsuccessful and led to patient being IVC'd. Patient will be re-evaluated tom., Despite patient being told multiple times that he was only IVC'd only after endorsing he was  leaving against medical advice, patient perseverates on his interactions with writer on first assessment this AM, and continues to endorse belief that is the main reason he was IVC'd. Patient does appear to have some splitting amongst team and would recommend that all team members communicate to minimize effects of splitting.   Diagnoses:  Active Hospital problems: Active Problems:   Hypoglycemia     Plan  ## Safety and Observation Level:  - Based on my clinical Adams, I estimate the patient to be at high risk of self harm in the current setting - At this time, we recommend a close level of observation. This decision is based on my review of the chart including patient's history and current presentation, interview of the patient, mental status examination, and consideration of suicide risk including evaluating suicidal ideation, plan, intent, suicidal or self-harm behaviors, risk factors, and protective factors. This judgment is based on our ability to directly address suicide risk, implement suicide prevention strategies and develop a safety plan while the patient is in the clinical setting. Please contact our team if there is a concern that risk level has changed.     ## Medications:   -- Continue home Zoloft 150mg  daily -- Continue home mirtazapine 7.5mg  qhs   ## Medical Decision Making Capacity:  Not formally assessed   ## Further Work-up:  -- Per primary       -- most recent EKG on 11/29/2022 had QtC of 412 NSR -- Pertinent labwork reviewed earlier this admission includes:        Latest Ref Rng & Units 11/30/2022    4:51 AM 11/29/2022    1:47 AM 10/30/2022    7:22 PM  CMP  Glucose 70 - 99 mg/dL 77  38  109   BUN 6 - 20 mg/dL 23  13  17    Creatinine 0.61 - 1.24 mg/dL 6.04  5.40  9.81   Sodium 135 - 145 mmol/L 138  141  139   Potassium 3.5 - 5.1 mmol/L 4.2  3.9  3.9   Chloride 98 - 111 mmol/L 102  105  103   CO2 22 - 32 mmol/L 25  21  24    Calcium 8.9 - 10.3 mg/dL 9.2   9.7  9.7   Total Protein 6.5 - 8.1 g/dL  7.3  6.8   Total Bilirubin 0.3 - 1.2 mg/dL  0.5  0.6   Alkaline Phos 38 - 126 U/L  62  49   AST 15 - 41 U/L  21  18   ALT 0 - 44 U/L  17  18     ## Disposition:  -- Inpatinet psych  ## Behavioral / Environmental:  -- 1:1 given decreasing participation in care concern that patient will not let team know if something is wrong, with intent to harm self by being passive about health -- IVC's 11/30/2022  ##Legal Status   Thank you for this consult request. Recommendations have been communicated to the primary team.  We will continue to follow at this time.   Bobbye Morton, MD   followup history  Relevant Aspects of Hospital Course:  [name] is a 37 y.o.  male admitted medically for 11/29/2022  1:35 AM for hypoglycemia with poor PO intake. He carries the psychiatric diagnoses of MDD, recurrent, severe and anxiety and has a past medical history of T1DM, HIV  .Psychiatry was consulted for depression with passive SI by Champ Mungo, DO.   Patient Report:  On assessment today patient is sitting on the same side of the bed, with his head in his hands in the dark with the TV on mute. Patient's IV alarm is going off, but patient does not vocalize being upset with this. Patient does not look up at provider once during assessment. Patient nodded or shook his head to indicate yes or no to questions.   Patient did endorse he had a headache; however when provider asked if her wanted anything he shook his head no. Patient also shook his head when asked if he had told anyone. Patient nodded "yes" when he was asked if he felt unwell. Patient endorsed that he does not have any new plans to harm self, but he continues to have his chornic passive SI and hopelessness feelings. Patient endorsed that he is still interested in going to inpatient psych.   Patient shook his ahead along in agreement when provider referenced his earlier visit with the primary team  notifying him that he was likely dehydrated. Patient endorsed that he was trying to get his fluids and that he had had dinner last night. Patient endorsed that he slept ok last night. Patient did not respond when asked about breakfast. Patient denies HI and AVH.  Psychiatric History:  Information collected from EMR INPT: none OPT: none Is following with OPT PCP who refills Sertraline 150mg  daily   The patient's family history includes Breast cancer in his maternal grandmother; Diabetes in his maternal grandmother and mother; Thyroid disease in his mother.  Medical History: Past Medical History:  Diagnosis Date   Anxiety    Asthma    Closed displaced comminuted fracture of shaft of right tibia 11/01/2017   Closed fracture of base of fifth metatarsal bone of right foot 11/13/2017   Closed fracture of medial malleolus of right ankle 10/31/2017   Depression    Headache    "only w/stress" (11/01/2017)   HIV (human immunodeficiency virus infection) (HCC)    Hypoglycemia due to insulin 06/27/2022   Migraine    "a few/month" (11/01/2017)   Refusal of blood transfusions as patient is Jehovah's Witness    Seizure (HCC)    "only w/my low blood sugars"  (11/01/2017)   Type I diabetes mellitus (HCC)     Surgical History: Past Surgical History:  Procedure Laterality Date   FRACTURE SURGERY     IM NAILING TIBIA Right 11/01/2017   INTRAMEDULLARY (IM) NAIL TIBIALRightGeneral   ORIF ANKLE FRACTURE Right 11/01/2017   ORIF ANKLE FRACTURE Right 11/01/2017   Procedure: OPEN REDUCTION INTERNAL FIXATION (ORIF) ANKLE FRACTURE;  Surgeon: Roby Lofts, MD;  Location: [facility] OR;  Service: Orthopedics;  Laterality: Right;   TIBIA IM NAIL INSERTION Right 11/01/2017   Procedure: INTRAMEDULLARY (IM) NAIL TIBIAL;  Surgeon: Roby Lofts, MD;  Location: [facility] OR;  Service: Orthopedics;  Laterality: Right;    Medications:   Current Facility-Administered Medications:    0.9 %  sodium chloride infusion, 250 mL,  Intravenous, PRN, Cardama, Amadeo Garnet, MD   elvitegravir-cobicistat-emtricitabine-tenofovir (GENVOYA) 150-150-200-10 MG tablet 1 tablet, 1 tablet, Oral, Q breakfast, Champ Mungo, DO, 1 tablet at 11/30/22 1002   enoxaparin (LOVENOX) injection 40 mg, 40 mg, Subcutaneous, Q24H,  Champ Mungo, DO, 40 mg at 11/30/22 1003   feeding supplement (ENSURE ENLIVE / ENSURE PLUS) liquid 237 mL, 237 mL, Oral, BID WC, Inez Catalina, MD   guaiFENesin-dextromethorphan (ROBITUSSIN DM) 100-10 MG/5ML syrup 5 mL, 5 mL, Oral, Q4H PRN, Champ Mungo, DO, 5 mL at 11/29/22 2307   hydrALAZINE (APRESOLINE) tablet 25 mg, 25 mg, Oral, Q8H, Masters, Katie, DO, 25 mg at 11/30/22 1334   irbesartan (AVAPRO) tablet 300 mg, 300 mg, Oral, Daily, 300 mg at 11/30/22 1002 **AND** hydrochlorothiazide (HYDRODIURIL) tablet 25 mg, 25 mg, Oral, Daily, Debe Coder B, MD, 25 mg at 11/30/22 1002   insulin aspart (novoLOG) injection 0-15 Units, 0-15 Units, Subcutaneous, TID WC, Champ Mungo, DO, 11 Units at 11/30/22 1003   insulin glargine-yfgn (SEMGLEE) injection 14 Units, 14 Units, Subcutaneous, Daily, Morrie Sheldon, MD, 14 Units at 11/30/22 1035   mirtazapine (REMERON) tablet 7.5 mg, 7.5 mg, Oral, QHS, Champ Mungo, DO, 7.5 mg at 11/29/22 2051   [START ON 12/01/2022] multivitamin with minerals tablet 1 tablet, 1 tablet, Oral, Daily, Inez Catalina, MD   ondansetron [facility]) injection 4 mg, 4 mg, Intravenous, Q6H PRN, Champ Mungo, DO   sertraline (ZOLOFT) tablet 150 mg, 150 mg, Oral, Daily, Champ Mungo, DO, 150 mg at 11/30/22 1003   sodium chloride flush (NS) 0.9 % injection 3 mL, 3 mL, Intravenous, Q12H, Cardama, Amadeo Garnet, MD, 3 mL at 11/30/22 1004   sodium chloride flush (NS) 0.9 % injection 3 mL, 3 mL, Intravenous, PRN, Cardama, Amadeo Garnet, MD  Allergies: Allergies  Allergen Reactions   Shellfish Allergy Anaphylaxis and Hives   Aspirin Other (See Comments)    Lump in armpit       Objective  Vital signs:  Temp:  [97.8 F  (36.6 C)-99.1 F (37.3 C)] 98.3 F (36.8 C) (10/02 0838) Pulse Rate:  [69-85] 84 (10/02 0838) Resp:  [17-18] 18 (10/02 0423) BP: (146-165)/(94-105) 146/94 (10/02 0838) SpO2:  [98 %-100 %] 100 % (10/02 0838) Weight:  [84.8 kg] 84.8 kg (10/02 1000)  Psychiatric Specialty Exam:  Presentation  General Appearance:  Appropriate for Environment; Casual  Eye Contact: -- (avoidant of Clinical research associate, but will look at attending provider on occassion)  Speech: Clear and Coherent  Speech Volume: Normal  Handedness:No data recorded  Mood and Affect  Mood: Depressed; Irritable  Affect: Congruent   Thought Process  Thought Processes: Coherent  Descriptions of Associations:Intact  Orientation:Full (Time, Place and Person)  Thought Content:Perseveration  History of Schizophrenia/Schizoaffective disorder:No  Duration of Psychotic Symptoms:No data recorded Hallucinations:Hallucinations: None  Ideas of Reference:None  Suicidal Thoughts:Suicidal Thoughts: Yes, Passive SI Passive Intent and/or Plan: Without Plan  Homicidal Thoughts:Homicidal Thoughts: No   Sensorium  Memory: Immediate Fair; Recent Fair  Judgment: Impaired  Insight: Shallow   Executive Functions  Concentration: Fair  Attention Span: Fair  Recall: Good  Fund of Knowledge: Good  Language: Good   Psychomotor Activity  Psychomotor Activity: Psychomotor Activity: Normal   Assets  Assets: Resilience   Sleep  Sleep: Sleep: Fair    Physical Exam: Physical Exam HENT:     Head: Normocephalic and atraumatic.  Pulmonary:     Effort: Pulmonary effort is normal.  Neurological:     Mental Status: He is alert.    Review of Systems  Psychiatric/Behavioral:  Positive for depression and suicidal ideas. Negative for hallucinations.    Blood pressure (!) 146/94, pulse 84, temperature 98.3 F (36.8 C), resp. rate 18, height 6' (1.829 m), weight 84.8 kg, SpO2  100%. Body mass index is 25.35  kg/m.

## 2022-12-01 ENCOUNTER — Inpatient Hospital Stay (HOSPITAL_COMMUNITY): Payer: MEDICAID

## 2022-12-01 DIAGNOSIS — E162 Hypoglycemia, unspecified: Secondary | ICD-10-CM | POA: Diagnosis not present

## 2022-12-01 LAB — BASIC METABOLIC PANEL
Anion gap: 13 (ref 5–15)
BUN: 29 mg/dL — ABNORMAL HIGH (ref 6–20)
CO2: 23 mmol/L (ref 22–32)
Calcium: 9.6 mg/dL (ref 8.9–10.3)
Chloride: 99 mmol/L (ref 98–111)
Creatinine, Ser: 1.96 mg/dL — ABNORMAL HIGH (ref 0.61–1.24)
GFR, Estimated: 45 mL/min — ABNORMAL LOW (ref >60)
Glucose, Bld: 336 mg/dL — ABNORMAL HIGH (ref 70–99)
Potassium: 4.5 mmol/L (ref 3.5–5.1)
Sodium: 135 mmol/L (ref 135–145)

## 2022-12-01 LAB — PROTEIN / CREATININE RATIO, URINE
Creatinine, Urine: 64 mg/dL
Protein Creatinine Ratio: 1.72 mg/mg{creat} — ABNORMAL HIGH (ref 0.00–0.15)
Total Protein, Urine: 110 mg/dL

## 2022-12-01 LAB — GLUCOSE, CAPILLARY
Glucose-Capillary: 298 mg/dL — ABNORMAL HIGH (ref 70–99)
Glucose-Capillary: 298 mg/dL — ABNORMAL HIGH (ref 70–99)
Glucose-Capillary: 337 mg/dL — ABNORMAL HIGH (ref 70–99)
Glucose-Capillary: 156 mg/dL — ABNORMAL HIGH (ref 70–99)

## 2022-12-01 MED ORDER — LACTATED RINGERS IV BOLUS
1000.0000 mL | Freq: Once | INTRAVENOUS | Status: AC
  Administered 2022-12-01: 1000 mL via INTRAVENOUS

## 2022-12-01 MED ORDER — INSULIN ASPART 100 UNIT/ML IJ SOLN: 0.0000 [IU] | Freq: Every day | INTRAMUSCULAR | Status: DC

## 2022-12-01 MED ORDER — INSULIN ASPART 100 UNIT/ML IJ SOLN
0.0000 [IU] | Freq: Three times a day (TID) | INTRAMUSCULAR | Status: DC
  Administered 2022-12-01: 3 [IU] via SUBCUTANEOUS
  Administered 2022-12-01: 11 [IU] via SUBCUTANEOUS
  Administered 2022-12-01: 4 [IU] via SUBCUTANEOUS

## 2022-12-01 MED ORDER — INSULIN ASPART 100 UNIT/ML IJ SOLN
2.0000 [IU] | Freq: Three times a day (TID) | INTRAMUSCULAR | Status: DC
  Administered 2022-12-01 (×2): 2 [IU] via SUBCUTANEOUS

## 2022-12-01 NOTE — Plan of Care (Signed)
Patient alert/oriented X4. Patient compliant with medication administration and tolerated IV lactated ringer bolus. Patient properly covered with insulin. Patient recent BP 171/103, MD aware. Patient remains IVCed and 1:1 sitter remains in room. No complaints at this time, minimal responses to questions asked.   Problem: Education: Goal: Ability to describe self-care measures that may prevent or decrease complications (Diabetes Survival Skills Education) will improve Outcome: Progressing   Problem: Education: Goal: Individualized Educational Video(s) Outcome: Progressing   Problem: Coping: Goal: Ability to adjust to condition or change in health will improve Outcome: Progressing   Problem: Fluid Volume: Goal: Ability to maintain a balanced intake and output will improve Outcome: Progressing   Problem: Health Behavior/Discharge Planning: Goal: Ability to identify and utilize available resources and services will improve Outcome: Progressing   Problem: Health Behavior/Discharge Planning: Goal: Ability to manage health-related needs will improve Outcome: Progressing   Problem: Nutritional: Goal: Maintenance of adequate nutrition will improve Outcome: Progressing   Problem: Nutritional: Goal: Progress toward achieving an optimal weight will improve Outcome: Progressing   Problem: Skin Integrity: Goal: Risk for impaired skin integrity will decrease Outcome: Progressing   Problem: Tissue Perfusion: Goal: Adequacy of tissue perfusion will improve Outcome: Progressing   Problem: Education: Goal: Knowledge of General Education information will improve Description: Including pain rating scale, medication(s)/side effects and non-pharmacologic comfort measures Outcome: Progressing   Problem: Health Behavior/Discharge Planning: Goal: Ability to manage health-related needs will improve Outcome: Progressing   Problem: Clinical Measurements: Goal: Ability to maintain clinical  measurements within normal limits will improve Outcome: Progressing   Problem: Clinical Measurements: Goal: Will remain free from infection Outcome: Progressing   Problem: Clinical Measurements: Goal: Diagnostic test results will improve Outcome: Progressing   Problem: Clinical Measurements: Goal: Respiratory complications will improve Outcome: Progressing   Problem: Clinical Measurements: Goal: Cardiovascular complication will be avoided Outcome: Progressing   Problem: Activity: Goal: Risk for activity intolerance will decrease Outcome: Progressing   Problem: Nutrition: Goal: Adequate nutrition will be maintained Outcome: Progressing   Problem: Coping: Goal: Level of anxiety will decrease Outcome: Progressing   Problem: Elimination: Goal: Will not experience complications related to bowel motility Outcome: Progressing   Problem: Pain Managment: Goal: General experience of comfort will improve Outcome: Progressing   Problem: Safety: Goal: Ability to remain free from injury will improve Outcome: Progressing   Problem: Skin Integrity: Goal: Risk for impaired skin integrity will decrease Outcome: Progressing

## 2022-12-01 NOTE — Progress Notes (Signed)
Attempted Echocardiogram 12/01/22 4:03 pm Patient refused exam.

## 2022-12-01 NOTE — Consult Note (Signed)
[name] Psychiatry Followup Face-to-Face Psychiatric Evaluation   Service Date: December 01, 2022 LOS:  LOS: 2 days    Assessment  [name] is a 37 y.o. male admitted medically for 11/29/2022  1:35 AM for hypoglycemia with poor PO intake. He carries the psychiatric diagnoses of MDD, recurrent, severe and anxiety and has a past medical history of T1DM, HIV  .Psychiatry was consulted for depression with passive SI by Champ Mungo, DO.   His current presentation of dysphoria with passive SI and hopelessness is most consistent with MDD. He meets criteria for MDD based on presentation and history.  Current outpatient psychotropic medications include Zoloft 150 mg daily and Remeron 7.5mg  qhs and historically he has had a minimal response to these medications. He was  compliant with medications prior to admission as evidenced by patient endorsement. On initial examination 11/29/2022, patient is tearful, endorsing hopelessness about his future, as well as feeling overwhelmed by his chronic illnesses. Patient was reassessed in the afternoon as he did not wish to continue assessment in the AM, but was initially refusing to answer questions regarding SI. Upon reassessment patient denies active SI but endorsed continued chronic SI w/o plan. Patient endorsed that he was willing to try inpatient psych hospitalization as he has not tried this before and feels that his current medications are not beneficial. Patient has been doing his best to keep up with his medical appts, but continues to feel as though he is making very little progress and is struggling with the will to live.  Please see plan below for detailed recommendations.   Patient was placed under IVC 11/30/2022 secondary to chronic suicidal ideation, severe hopelessness, making fatalistic comments, refusing care, and impulsive actions attempting to leave AMA and concern for risk of self-harm.  Mirtazapine increased to 15 mg.  On evaluation  today, patient is sitting with his back to this Clinical research associate.  Glances to make minimal eye contact briefly, otherwise states looking out the window.  He is not interactive not answer questions.  Patient was provided instruction on continuation of involuntary commitment, however he will be reassessed by psychiatry daily with reversal of IVC as indicated.  Supportive care was provided.  Patient was encouraged to think about resources would be of benefit to him following disposition. Patient's nurse was in the room at time of assessment.  After this writer left room, patient's nurse states that patient has been client with care and communicating with nursing staff appropriately.  Diagnoses:  Active Hospital problems: Principal Problem:   Hypoglycemia     Plan  ## Safety and Observation Level:  - Based on my clinical evaluation, I estimate the patient to be at moderate risk of self harm in the current setting - At this time, we recommend a close level of observation. This decision is based on my review of the chart including patient's history and current presentation, interview of the patient, mental status examination, and consideration of suicide risk including evaluating suicidal ideation, plan, intent, suicidal or self-harm behaviors, risk factors, and protective factors. This judgment is based on our ability to directly address suicide risk, implement suicide prevention strategies and develop a safety plan while the patient is in the clinical setting. Please contact our team if there is a concern that risk level has changed.   ## Medications:  -- Continue home Zoloft 150 mg daily -- Continue home mirtazapine 15 mg qhs  ## Medical Decision Making Capacity:  Not formally assessed  ## Further Work-up:  --  Per primary   -- most recent EKG on 11/29/2022 had QtC of 412 NSR -- Pertinent labwork reviewed earlier this admission includes:  Lab Results  Component Value Date   HGBA1C 8.6 (H) 10/31/2022    HGBA1C 8.5 (H) 10/02/2022   HGBA1C 8.8 (A) 08/16/2022       Latest Ref Rng & Units 12/01/2022    5:05 AM 11/30/2022    4:51 AM 11/29/2022    1:47 AM  CMP  Glucose 70 - 99 mg/dL 409  77  38   BUN 6 - 20 mg/dL 29  23  13    Creatinine 0.61 - 1.24 mg/dL 8.11  9.14  7.82   Sodium 135 - 145 mmol/L 135  138  141   Potassium 3.5 - 5.1 mmol/L 4.5  4.2  3.9   Chloride 98 - 111 mmol/L 99  102  105   CO2 22 - 32 mmol/L 23  25  21    Calcium 8.9 - 10.3 mg/dL 9.6  9.2  9.7   Total Protein 6.5 - 8.1 g/dL   7.3   Total Bilirubin 0.3 - 1.2 mg/dL   0.5   Alkaline Phos 38 - 126 U/L   62   AST 15 - 41 U/L   21   ALT 0 - 44 U/L   17      ## Disposition:  -- Inpatient psych, currently under involuntary commitment -- Patient is not currently interested in therapy. At this time will continue outpatient medication regimen, to help patient feel in more control. Patient endorses feeling that not much is within his control, and would like to help patient feel some autonomy.   ## Behavioral / Environmental:  -- Close observation  ##Legal Status   Thank you for this consult request. Recommendations have been communicated to the primary team.  We will continue to monitor at this time.   Mariel Craft, MD   NEW  history  Relevant Aspects of Hospital Course:  [name] is a 37 y.o. male admitted medically for 11/29/2022  1:35 AM for hypoglycemia with poor PO intake. He carries the psychiatric diagnoses of MDD, recurrent, severe and anxiety and has a past medical history of T1DM, HIV  .Psychiatry was consulted for depression with passive SI by Champ Mungo, DO.   Patient Report:   12/01/2022: Minimal eye contact, and refusing to speak with Clinical research associate.  From initial assessment 11/29/2022: Patient endorses significant depression related to his chronic illness. Patient reports that he does not feel like he has control over himself due to the illness he has and also feels judged  and mistreated by  others as a result of his HIV dx. Patient endorses that his HIV dx. Is especially traumatic given how he contracted the disease and the fact that he cannot "forget" or escape the diagnosis given he has to let potential partners know. Patient is tearful during interaction and endorses that he does not want to really live due to his inability to feel in control, but does not out right endorse plans to harm self. Patient reports that he takes all of his home medications as prescribed.  Patient reports that he is not really sure what his expectations should be and struggles with thinking about the future. Patient denies active SI, but endorses that he has frequent passive SI w/o plan. Patient does not endorse HI or AVH on assessment. Patient endorses feeling isolated due to his illness and depression. Patient endorses that he does not  feel that medication has been helpful for his depression and would like to hear other options. It was discussed with patient choices such as inpatient psych, opt therapy, or some studies about alternative medications for patients with similar demographics. Patient discussed not feeling hopeful about therapy and reported that this sounds more traumatic as he does not like having to talk, think or address his medical issues. Patient endorsed interest at inpatient and reported that he felt like he has little control over decisions regarding his health, but was willing to go inpatient voluntarily. Patient endorsed that unlike last presentation he is no longer hopeful about his job, and these was not much outside of the hospital that would lead to him leaving before recommended by healthcare team. Patient endorsed he was willing to stay for medical care at this time.  Patient did reports that he also feels that he has been depressed but does not feel that anything can be done for this as he feels it directly relates to his diagnosis and they are both irreversible.    Psychiatric History:   Information collected from EMR INPT: none OPT: none Is following with OPT PCP who refills Sertraline 150 mg daily  Family psych history: unknown  Tobacco use: not assessed at this time Alcohol use:  not assessed at this time Drug use:  not assessed at this time  Family History:   The patient's family history includes Breast cancer in his maternal grandmother; Diabetes in his maternal grandmother and mother; Thyroid disease in his mother.  Medical History: Past Medical History:  Diagnosis Date  . Anxiety   . Asthma   . Closed displaced comminuted fracture of shaft of right tibia 11/01/2017  . Closed fracture of base of fifth metatarsal bone of right foot 11/13/2017  . Closed fracture of medial malleolus of right ankle 10/31/2017  . Depression   . Headache    "only w/stress" (11/01/2017)  . HIV (human immunodeficiency virus infection) (HCC)   . Hypoglycemia due to insulin 06/27/2022  . Migraine    "a few/month" (11/01/2017)  . Refusal of blood transfusions as patient is Jehovah's Witness   . Seizure (HCC)    "only w/my low blood sugars"  (11/01/2017)  . Type I diabetes mellitus (HCC)     Surgical History: Past Surgical History:  Procedure Laterality Date  . FRACTURE SURGERY    . IM NAILING TIBIA Right 11/01/2017   INTRAMEDULLARY (IM) NAIL TIBIALRightGeneral  . ORIF ANKLE FRACTURE Right 11/01/2017  . ORIF ANKLE FRACTURE Right 11/01/2017   Procedure: OPEN REDUCTION INTERNAL FIXATION (ORIF) ANKLE FRACTURE;  Surgeon: Roby Lofts, MD;  Location: [facility] OR;  Service: Orthopedics;  Laterality: Right;  . TIBIA IM NAIL INSERTION Right 11/01/2017   Procedure: INTRAMEDULLARY (IM) NAIL TIBIAL;  Surgeon: Roby Lofts, MD;  Location: [facility] OR;  Service: Orthopedics;  Laterality: Right;    Medications:   Current Facility-Administered Medications:  .  0.9 %  sodium chloride infusion, 250 mL, Intravenous, PRN, Cardama, Amadeo Garnet, MD .  elvitegravir-cobicistat-emtricitabine-tenofovir  (GENVOYA) 150-150-200-10 MG tablet 1 tablet, 1 tablet, Oral, Q breakfast, Champ Mungo, DO, 1 tablet at 12/01/22 [phone] .  enoxaparin (LOVENOX) injection 40 mg, 40 mg, Subcutaneous, Q24H, Champ Mungo, DO, 40 mg at 12/01/22 0918 .  feeding supplement (ENSURE ENLIVE / ENSURE PLUS) liquid 237 mL, 237 mL, Oral, BID WC, Debe Coder B, MD, 237 mL at 12/01/22 1628 .  guaiFENesin-dextromethorphan (ROBITUSSIN DM) 100-10 MG/5ML syrup 5 mL, 5 mL, Oral, Q4H PRN, August Saucer,  Emily, DO, 5 mL at 11/29/22 2307 .  hydrALAZINE (APRESOLINE) tablet 25 mg, 25 mg, Oral, Q8H, Masters, Katie, DO, 25 mg at 12/01/22 1308 .  irbesartan (AVAPRO) tablet 300 mg, 300 mg, Oral, Daily, 300 mg at 12/01/22 0918 **AND** hydrochlorothiazide (HYDRODIURIL) tablet 25 mg, 25 mg, Oral, Daily, Debe Coder B, MD, 25 mg at 12/01/22 0918 .  insulin aspart (novoLOG) injection 0-15 Units, 0-15 Units, Subcutaneous, TID WC, Morrie Sheldon, MD, 3 Units at 12/01/22 1628 .  insulin aspart (novoLOG) injection 0-5 Units, 0-5 Units, Subcutaneous, QHS, Ghiles, Connor, MD .  insulin aspart (novoLOG) injection 2 Units, 2 Units, Subcutaneous, TID WC, Morrie Sheldon, MD, 2 Units at 12/01/22 1629 .  insulin glargine-yfgn (SEMGLEE) injection 14 Units, 14 Units, Subcutaneous, Daily, Morrie Sheldon, MD, 14 Units at 12/01/22 0918 .  mirtazapine (REMERON) tablet 15 mg, 15 mg, Oral, QHS, Masters, Katie, DO .  multivitamin with minerals tablet 1 tablet, 1 tablet, Oral, Daily, Inez Catalina, MD, 1 tablet at 12/01/22 1212 .  ondansetron (ZOFRAN) injection 4 mg, 4 mg, Intravenous, Q6H PRN, Champ Mungo, DO .  sertraline (ZOLOFT) tablet 150 mg, 150 mg, Oral, Daily, Champ Mungo, DO, 150 mg at 12/01/22 0917 .  sodium chloride flush (NS) 0.9 % injection 3 mL, 3 mL, Intravenous, Q12H, Cardama, Amadeo Garnet, MD, 3 mL at 12/01/22 0919 .  sodium chloride flush (NS) 0.9 % injection 3 mL, 3 mL, Intravenous, PRN, Cardama, Amadeo Garnet, MD  Allergies: Allergies  Allergen Reactions   . Shellfish Allergy Anaphylaxis and Hives  . Aspirin Other (See Comments)    Lump in armpit       Objective  Vital signs:  Temp:  [98.1 F (36.7 C)-99.2 F (37.3 C)] 99 F (37.2 C) (10/03 1600) Pulse Rate:  [83-108] 108 (10/03 1600) Resp:  [18-20] 18 (10/03 1600) BP: (141-171)/(95-107) 171/103 (10/03 1600) SpO2:  [99 %-100 %] 100 % (10/03 1600)  Psychiatric Specialty Exam:  Presentation  General Appearance:  Appropriate for Environment; Casual  Eye Contact: Minimal  Speech: Other (comment) (Refusing to speak to this Clinical research associate, speaking with staff members)  Speech Volume: -- (unable to assess)  Handedness:No data recorded  Mood and Affect  Mood: Depressed; Irritable  Affect: Congruent   Thought Process  Thought Processes: Coherent  Descriptions of Associations:Intact  Orientation:-- (Unable to assess)  Thought Content:-- (Unable to assess)  History of Schizophrenia/Schizoaffective disorder:No  Duration of Psychotic Symptoms:No data recorded Hallucinations:Hallucinations: -- (Unable to assess)  Ideas of Reference:-- (Unable to assess)  Suicidal Thoughts:Suicidal Thoughts: -- (Unable to assess) SI Passive Intent and/or Plan: Without Plan  Homicidal Thoughts:Homicidal Thoughts: -- (Unable to assess)   Sensorium  Memory: -- (Unable to assess)  Judgment: Poor  Insight: -- (Unable to assess)   Executive Functions  Concentration: -- (Unable to assess)  Attention Span: Fair (appears to be paying attention, some nodding of head)  Recall: -- (Unable to assess)  Fund of Knowledge: -- (Unable to assess)  Language: -- (Unable to assess)   Psychomotor Activity  Psychomotor Activity:Psychomotor Activity: Decreased   Assets  Assets: Resilience   Sleep  Sleep:Sleep: -- (not reported)    Physical Exam: Physical Exam Constitutional:      Appearance: Normal appearance.  Pulmonary:     Effort: Pulmonary effort is normal. No  respiratory distress.  Neurological:     Mental Status: He is alert.    Review of Systems  Psychiatric/Behavioral:  Positive for depression and suicidal ideas.    Blood pressure Marland Kitchen)  171/103, pulse (!) 108, temperature 99 F (37.2 C), temperature source Oral, resp. rate 18, height 6' (1.829 m), weight 84.8 kg, SpO2 100%. Body mass index is 25.35 kg/m.

## 2022-12-01 NOTE — Progress Notes (Signed)
HD#2 SUBJECTIVE:  Patient Summary: [name] is a 37 y.o. with a pertinent PMH of T1DM, HIV, anxiety, and depression who presented with nausea, vomiting, and diarrhea and admitted for hypoglycemia.   Overnight Events: NAEO  Interim History: Patient was evaluated at bedside. Continues to have flat affect. Endorsed frustration with hospitalization but denied any SOB, chest pain, or other signs or symptoms.   OBJECTIVE:  Vital Signs: Vitals:   11/30/22 1709 11/30/22 2115 12/01/22 0610 12/01/22 0700  BP: (!) 170/107 (!) 152/105 (!) 141/103 (!) 148/95  Pulse: (!) 105 (!) 102 84 83  Resp:  20 18 19   Temp: 99.2 F (37.3 C) 98.1 F (36.7 C) 98.1 F (36.7 C) 98.9 F (37.2 C)  TempSrc: Oral Oral Oral Oral  SpO2: 100% 99% 100% 100%  Weight:      Height:       Supplemental O2: Room Air SpO2: 100 %  Filed Weights   11/30/22 1000  Weight: 84.8 kg     Intake/Output Summary (Last 24 hours) at 12/01/2022 1446 Last data filed at 11/30/2022 1730 Gross per 24 hour  Intake 60 ml  Output --  Net 60 ml   Net IO Since Admission: 60 mL [12/01/22 1446]  Physical Exam: Physical Exam Constitutional:      Appearance: Normal appearance.  HENT:     Head: Normocephalic and atraumatic.  Cardiovascular:     Rate and Rhythm: Normal rate and regular rhythm.     Pulses: Normal pulses.     Heart sounds: Normal heart sounds.  Abdominal:     General: Abdomen is flat. Bowel sounds are normal.     Palpations: Abdomen is soft.  Neurological:     Mental Status: He is alert.  Psychiatric:        Attention and Perception: Attention normal.        Mood and Affect: Affect is flat.     Comments: Endorsed frustration with hospitalization with modest improvement in engagement of assessment.     CBC    Component Value Date/Time   WBC 7.6 11/30/2022 0451   RBC 4.81 11/30/2022 0451   HGB 14.8 11/30/2022 0451   HCT 42.6 11/30/2022 0451   PLT 298 11/30/2022 0451   MCV 88.6 11/30/2022  0451   MCH 30.8 11/30/2022 0451   MCHC 34.7 11/30/2022 0451   RDW 14.4 11/30/2022 0451   LYMPHSABS 0.7 10/30/2022 1702   MONOABS 0.4 10/30/2022 1702   EOSABS 0.0 10/30/2022 1702   BASOSABS 0.0 10/30/2022 1702      Latest Ref Rng & Units 12/01/2022    5:05 AM 11/30/2022    4:51 AM 11/29/2022    1:47 AM  BMP  Glucose 70 - 99 mg/dL 161  77  38   BUN 6 - 20 mg/dL 29  23  13    Creatinine 0.61 - 1.24 mg/dL 0.96  0.45  4.09   Sodium 135 - 145 mmol/L 135  138  141   Potassium 3.5 - 5.1 mmol/L 4.5  4.2  3.9   Chloride 98 - 111 mmol/L 99  102  105   CO2 22 - 32 mmol/L 23  25  21    Calcium 8.9 - 10.3 mg/dL 9.6  9.2  9.7      ASSESSMENT/PLAN:  Assessment: Principal Problem:   Hypoglycemia  Plan: # Type 1 diabetes mellitus #Hypoglycemia Glucose continues to be elevated. Patient continues to have poor oral intake with an irregular diet. Continuing SSI 3 times daily  with meals and at bedtime. Glargine dosage not adjusted as patient noted to have had a drop to 53 yesterday evening. Encouraged continued increased PO intake by patient but patient frequently refusing meals. Patient denies any polyuria, increased thirst, or any other signs or symptoms.  - Continue Lantus 14 units daily - SSI 3 times daily with meals and at bedtime  #AKI #CKD stage IIIa Creatinine worsened to 1.96 and GFR of 45. Baseline creatinine ~1.25. This continues to be likely in the context of decreased oral intake. Patient received 1 L bolus of LR yesterday and received an additional 1 L LR bolus today. Protein creatinine ratio increased at 1.72. If kidney function continues to decline, we will likely consult nephrology.  - Additional 1 L bolus of LR - Trend BMP - Encourage increased p.o. intake  #Depression #Anxiety Patient IVC'd due to chronic SI, severe hopelessness, making fatalistic comments, refusing care, and having impulsive actions attempting to leave AMA with a concern for a high risk for self-harm. He  continues to be followed by psychiatry. Continues to display low affect on examination. Per psychiatry, denies active SI but endorses continued chronic SI without a plan.  - Continue home Zoloft 150 mg daily - Continue home mirtazapine 7.5 mg nightly - F/U psychiatry recommendations - appreciate recs   #HIV #History of syphilis Last labs were checked in June in which his HIV was undetectable with a CD4 count of 732.  Noted to have been recently treated for syphilis in which she received 3 injections of penicillin G. - Continued home medication of Genvoya   #Hypertension Last BP this AM elevated with a systolic in the 140s. Continues to take valsartan-hydrochlorothiazide 320-25 mg daily and hydralazine 25 mg 3 times daily.   - Continue home valsartan-hydrochlorothiazide - Continue home hydralazine  #Rhinovirus Remains hemodynamically stable with no leukocytosis.  Denies any SOB, chest pain, congestion, or other signs or symptoms.  - Continue Robitussin  Best Practice: Diet: Regular diet IVF: Fluids: LR, Rate:  1000 cc bolus VTE: enoxaparin (LOVENOX) injection 40 mg Start: 11/30/22 1000 Code:  DNR-Comfort AB: None Therapy Recs: N/A, DME: other N/A DISPO: Anticipated discharge tomorrow to psychiatry pending  medication management and AKI resolution .  Signature: Morrie Sheldon, MD Internal Medicine Resident, PGY-1 [name]  Pager: [phone]  Please contact the on call pager after 5 pm and on weekends at [phone].

## 2022-12-01 NOTE — Progress Notes (Signed)
Attempted 2D Transthoracic Echocardiogram on 11/30/2022 and 12/01/2022.  Patient refused exam on both dates. RN notified.

## 2022-12-02 NOTE — Progress Notes (Signed)
Day IMTS team made aware this morning of patient's elopement. Discussed with RN Elita Boone, she will document the events last night. We have notified the psychiatry team. We have notified [facility] Enforcement given patient was under active IVC.

## 2022-12-02 NOTE — Consult Note (Signed)
  Communication received this morning that patient eloped. Primary team encouraged to notify [facility] or rescind IVC. Also recommended SZP, which was completed last night. Please reconsult if patient found or returns to hospital.

## 2022-12-02 NOTE — Progress Notes (Signed)
Pt alert and oriented x4, laid eyes on pt prior to providing care to my next pt. At 2015, pt sat in recliner with back turn to door, observe safety sitter in arms reach and sight..  Rounded again at 2035, on pt. sitter informed me pt is no longer in room or unit, search unit for pt, pt not found, inform AC, security and management.

## 2022-12-03 NOTE — Discharge Summary (Signed)
Name: [name] MRN: [id number] DOB: 1986-01-04 37 y.o. PCP: Juan Frisk, MD  Date of Admission: 11/29/2022  1:35 AM Date of Discharge: 12/01/2022 Attending Physician: No att. providers found   Patient ELOPED Against Medical Advice  Discharge Diagnosis: 1. Hypoglycemia and Hyperglycemia in context of Uncontrolled T1DM  2. AKI in context of CKD Stage IIIa  3. Depression and Anxiety  Discharge Medications: Allergies as of 12/01/2022       Reactions   Shellfish Allergy Anaphylaxis, Hives   Aspirin Other (See Comments)   Lump in armpit        Medication List     ASK your doctor about these medications    Accu-Chek Guide test strip Generic drug: glucose blood Use to check blood sugar 3 times daily.   Accu-Chek Softclix Lancets lancets Use to check blood sugar three times daily.   ALKA-SELTZER EXTRA STRENGTH PO Take 1 tablet by mouth daily as needed (Cold sx).   Dexcom G7 Receiver Devi Use to check blood sugar continuously   Dexcom G7 Sensor Misc Use to check blood sugar continuously. Change sensors once every 10 days.   Genvoya 150-150-200-10 MG Tabs tablet Generic drug: elvitegravir-cobicistat-emtricitabine-tenofovir Take 1 tablet by mouth daily with breakfast.   guaifenesin 100 MG/5ML syrup Commonly known as: ROBITUSSIN Take 200 mg by mouth 3 (three) times daily as needed for cough.   HumaLOG KwikPen 100 UNIT/ML KwikPen Generic drug: insulin lispro Inject 1-6 Units into the skin as directed. ONLY with food - 1 unit per 15 grams of carbohydrates (bread, pasta, fruit, etc)   hydrALAZINE 25 MG tablet Commonly known as: APRESOLINE Take 1 tablet (25 mg total) by mouth every 8 (eight) hours. For hypertension   Lantus SoloStar 100 UNIT/ML Solostar Pen Generic drug: insulin glargine Inject 18 Units into the skin daily. Make an appt with Dr. Delford Adams for more refills.   mirtazapine 15 MG tablet Commonly known as: Remeron Take 0.5 tablets  (7.5 mg total) by mouth at bedtime.   NYQUIL COLD & FLU PO Take 1 Dose by mouth every 6 (six) hours as needed (Cold sx).   sertraline 100 MG tablet Commonly known as: ZOLOFT Take 1.5 tablets (150 mg total) by mouth daily.   valsartan-hydrochlorothiazide 320-25 MG tablet Commonly known as: DIOVAN-HCT Take 1 tablet by mouth daily.        Disposition and follow-up:   [name] was discharged from [facility] in serious condition.  PATIENT ELOPED WITH A 1:1 SITTER WHILE IVC'D. PATIENT WAS NOT STABLE AT DISCHARGE. LOCAL LAW [facility] WAS CONTACTED GIVEN PATIENT HAS ACTIVE IVC IN PLACE.   Hospital Course by problem list:  #Type 1 diabetes mellitus #Hypoglycemia Presented with a glucose of 38. Last A1c in September 2024 was 8.6%.  Patient has had 2 presentations to the hospital since then when he was hypoglycemic; however likely secondary to poor oral intake.  Patient noted to have not been using short acting insulin appropriately, as well as not understanding how to correctly carb count complicating his diabetes management.  Diabetes coordinator and dietician consulted.  Noted been taking Lantus 18 units at home. Decreased to 14 units. Given SSI 3 times daily with meals and at bedtime. Not stable on discharge.   #AKI #CKD stage IIIa Presented with a creatinine of 1.38 and GFR greater than 60.  His baseline creatinine is ~1.25.  Creatinine increased to 1.88 and GFR decreased to 47.  This was likely in the context of decreased  oral intake. Given 2 boluses of fluids. Not stable on discharge.   #Depression #Anxiety Patient endorses chronic history of depression and is likely multifactorial with his chronic diseases.  Patient endorses frequent passive SI without a plan.  Psychiatry consulted and believe he is a good candidate for voluntary psychiatric hospitalization. Later in the afternoon, patient noted to have been considering leaving AMA, so psych was  consulted for IVC. Continued on home Zoloft and mirtazpine.   #HIV #History of syphilis Last labs were checked in June in which his HIV was undetectable with a CD4 count of 732.  Noted to have been recently treated for syphilis in which she received 3 injections of penicillin G. Continued on home medication of Genvoya   #Hypertension Continued on valsartan-hydrochlorothiazide 320-25 mg daily and hydralazine 25 mg 3 times daily.    #Rhinovirus Patient has endorsed symptoms of an upper respiratory tract infection over the past week.  He has been afebrile.  He has been hemodynamically stable with no leukocytosis.  Respiratory panel was positive for rhinovirus. Continued on robitussin.   Discharge Exam:   BP (!) 171/103 (BP Location: Left Arm)   Pulse (!) 108   Temp 99 F (37.2 C) (Oral)   Resp 18   Ht 6' (1.829 m)   Wt 84.8 kg Comment: bed scale  SpO2 100%   BMI 25.35 kg/m   Pertinent Labs, Studies, and Procedures:  CBC    Component Value Date/Time   WBC 7.6 11/30/2022 0451   RBC 4.81 11/30/2022 0451   HGB 14.8 11/30/2022 0451   HCT 42.6 11/30/2022 0451   PLT 298 11/30/2022 0451   MCV 88.6 11/30/2022 0451   MCH 30.8 11/30/2022 0451   MCHC 34.7 11/30/2022 0451   RDW 14.4 11/30/2022 0451   LYMPHSABS 0.7 10/30/2022 1702   MONOABS 0.4 10/30/2022 1702   EOSABS 0.0 10/30/2022 1702   BASOSABS 0.0 10/30/2022 1702   CMP     Component Value Date/Time   NA 135 12/01/2022 0505   K 4.5 12/01/2022 0505   CL 99 12/01/2022 0505   CO2 23 12/01/2022 0505   GLUCOSE 336 (H) 12/01/2022 0505   BUN 29 (H) 12/01/2022 0505   CREATININE 1.96 (H) 12/01/2022 0505   CREATININE 1.51 (H) 08/03/2022 1201   CALCIUM 9.6 12/01/2022 0505   PROT 7.3 11/29/2022 0147   ALBUMIN 3.8 11/29/2022 0147   AST 21 11/29/2022 0147   ALT 17 11/29/2022 0147   ALKPHOS 62 11/29/2022 0147   BILITOT 0.5 11/29/2022 0147   EGFR 61 08/03/2022 1201   GFRNONAA 45 (L) 12/01/2022 0505   GFRNONAA 90 03/10/2020 0950    CBG (last 3)  Recent Labs    12/01/22 0846 12/01/22 1154 12/01/22 1622  GLUCAP 298* 337* 156*      Signed: Morrie Sheldon, MD 12/03/2022, 11:31 AM

## 2022-12-05 ENCOUNTER — Telehealth: Payer: Self-pay

## 2022-12-05 NOTE — Transitions of Care (Post Inpatient/ED Visit) (Signed)
12/05/2022  Name: [name] MRN: [id number] DOB: 1985/03/13  Today's TOC FU Call Status: Today's TOC FU Call Status:: Unsuccessful Call (2nd Attempt) Unsuccessful Call (2nd Attempt) Date: 12/05/22  Attempted to reach the patient regarding the most recent Inpatient/ED visit.  Follow Up Plan: Additional outreach attempts will be made to reach the patient to complete the Transitions of Care (Post Inpatient/ED visit) call.      Antionette Fairy, RN,BSN,CCM RN Care Manager Transitions of Care  [facility]  Direct Phone: [phone] Toll Free: [phone] Fax: [phone]

## 2022-12-05 NOTE — Transitions of Care (Post Inpatient/ED Visit) (Signed)
12/05/2022  Name: [name] MRN: [id number] DOB: October 22, 1985  Today's TOC FU Call Status: Today's TOC FU Call Status:: Unsuccessful Call (1st Attempt) Unsuccessful Call (1st Attempt) Date: 12/05/22  Attempted to reach the patient regarding the most recent Inpatient/ED visit.  Follow Up Plan: Additional outreach attempts will be made to reach the patient to complete the Transitions of Care (Post Inpatient/ED visit) call.     Antionette Fairy, RN,BSN,CCM RN Care Manager Transitions of Care  [facility]  Direct Phone: [phone] Toll Free: [phone] Fax: [phone]

## 2022-12-06 ENCOUNTER — Telehealth: Payer: Self-pay

## 2022-12-06 NOTE — Transitions of Care (Post Inpatient/ED Visit) (Signed)
12/06/2022  Name: Leonard Fagnani MRN: [id number] DOB: April 16, 1985  Today's TOC FU Call Status: Today's TOC FU Call Status:: Unsuccessful Call (3rd Attempt) Unsuccessful Call (3rd Attempt) Date: 12/06/22  Attempted to reach the patient regarding the most recent Inpatient/ED visit.  Follow Up Plan: No further outreach attempts will be made at this time. We have been unable to contact the patient.    Antionette Fairy, RN,BSN,CCM RN Care Manager Transitions of Care  [facility]  Direct Phone: [phone] Toll Free: [phone] Fax: [phone]

## 2022-12-23 ENCOUNTER — Other Ambulatory Visit: Payer: Self-pay

## 2022-12-23 ENCOUNTER — Other Ambulatory Visit: Payer: Self-pay | Admitting: Critical Care Medicine

## 2022-12-27 ENCOUNTER — Telehealth: Payer: Self-pay

## 2022-12-27 ENCOUNTER — Ambulatory Visit: Payer: MEDICAID | Admitting: Family

## 2022-12-27 NOTE — Telephone Encounter (Signed)
Called patient to reschedule missed appointment to answer. Unable to leave vm phone not in service.

## 2022-12-29 ENCOUNTER — Other Ambulatory Visit: Payer: Self-pay

## 2023-01-05 ENCOUNTER — Other Ambulatory Visit: Payer: Self-pay

## 2023-01-06 ENCOUNTER — Ambulatory Visit: Payer: MEDICAID | Attending: Nurse Practitioner | Admitting: Nurse Practitioner

## 2023-01-06 ENCOUNTER — Encounter: Payer: Self-pay | Admitting: Nurse Practitioner

## 2023-01-06 ENCOUNTER — Other Ambulatory Visit: Payer: Self-pay

## 2023-01-06 VITALS — BP 163/96 | HR 78 | Ht 72.0 in | Wt 186.7 lb

## 2023-01-06 DIAGNOSIS — E1022 Type 1 diabetes mellitus with diabetic chronic kidney disease: Secondary | ICD-10-CM

## 2023-01-06 DIAGNOSIS — I10 Essential (primary) hypertension: Secondary | ICD-10-CM

## 2023-01-06 DIAGNOSIS — Z794 Long term (current) use of insulin: Secondary | ICD-10-CM

## 2023-01-06 DIAGNOSIS — E119 Type 2 diabetes mellitus without complications: Secondary | ICD-10-CM

## 2023-01-06 DIAGNOSIS — E1021 Type 1 diabetes mellitus with diabetic nephropathy: Secondary | ICD-10-CM

## 2023-01-06 DIAGNOSIS — N1832 Chronic kidney disease, stage 3b: Secondary | ICD-10-CM

## 2023-01-06 DIAGNOSIS — F332 Major depressive disorder, recurrent severe without psychotic features: Secondary | ICD-10-CM

## 2023-01-06 MED ORDER — LANTUS SOLOSTAR 100 UNIT/ML ~~LOC~~ SOPN
18.0000 [IU] | PEN_INJECTOR | Freq: Every day | SUBCUTANEOUS | 3 refills | Status: DC
  Filled 2023-01-06: qty 15, 83d supply, fill #0

## 2023-01-06 MED ORDER — HYDRALAZINE HCL 25 MG PO TABS
25.0000 mg | ORAL_TABLET | Freq: Three times a day (TID) | ORAL | 2 refills | Status: DC
Start: 2023-01-06 — End: 2023-02-01
  Filled 2023-01-06: qty 90, 30d supply, fill #0

## 2023-01-06 NOTE — Progress Notes (Signed)
Assessment & Plan:  Juan "Shaun" was seen today for medication refill.  Diagnoses and all orders for this visit:  Diabetes mellitus with insulin therapy  Medication adherence and lifestyle modifications are the most important factors with ensuring diabetes is controlled -     Basic metabolic panel -     insulin lispro (HUMALOG) 100 UNIT/ML KwikPen; Inject 6 Units into the skin with breakfast, with lunch, and with evening meal. For blood sugars 0-150 give 0 units of insulin, 151-200 give 2 units of insulin, 201-250 give 4 units, 251-300 give 6 units, 301-350 give 8 units, 351-400 give 10 units,> 400 give 12 units -     insulin glargine (LANTUS SOLOSTAR) 100 UNIT/ML Solostar Pen; Inject 18 Units into the skin daily.  Essential hypertension -     Discontinue: hydrALAZINE (APRESOLINE) 25 MG tablet; Take 1 tablet (25 mg total) by mouth every 8 (eight) hours. For hypertension -     Basic metabolic panel -     valsartan-hydrochlorothiazide (DIOVAN-HCT) 320-25 MG tablet; Take 1 tablet by mouth daily. -     hydrALAZINE (APRESOLINE) 25 MG tablet; Take 1 tablet (25 mg total) by mouth every 8 (eight) hours. 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 -     sertraline (ZOLOFT) 100 MG tablet; Take 1.5 tablets (150 mg total) by mouth daily.    Patient has been counseled on age-appropriate routine health concerns for screening and prevention. These are reviewed and up-to-date. Referrals have been placed accordingly. Immunizations are up-to-date or declined.    Subjective:   Chief Complaint  Patient presents with   Medication Refill    Tyquan Danirus Adams 37 y.o. male presents to office today for medication refills and follow-up to hypertension.  He is a patient of Dr.  Delford Field.  He has a past medical history of Anxiety, Asthma, Closed displaced comminuted fracture of shaft of right tibia (11/01/2017), Closed fracture of base of fifth metatarsal bone of right foot (11/13/2017), Closed fracture of medial malleolus of right ankle (10/31/2017), Depression, Headache, HIV, Hypoglycemia due to insulin (06/27/2022), Migraine, Refusal of blood transfusions as patient is Jehovah's Witness, Seizure, and Type I diabetes mellitus    DM 2 A1c is not due today however diabetes is poorly controlled.  His living situation is not ideal and he has been homeless frequently throughout the year.  Currently prescribed Humalog on sliding scale, 8 units with meals and Lantus 18 units daily. Lab Results  Component Value Date   HGBA1C 8.6 (H) 10/31/2022     HTN Stopped taking diovan-hct 320-25mg  daily. States blood pressures at home are normal. Not taking hydraalzine as prescribed. Only taking once a day and this depends on how he feels.  Blood pressure significantly elevated today.  He is aware his hydralazine should be taking 3 times a day. BP Readings from Last 3 Encounters:  01/06/23 (!) 163/96  12/01/22 (!) 171/103  10/31/22 132/81     Review of Systems  Constitutional:  Negative for fever, malaise/fatigue and weight loss.  HENT: Negative.  Negative for nosebleeds.   Eyes: Negative.  Negative for blurred vision, double vision and photophobia.  Respiratory: Negative.  Negative for cough and shortness of breath.   Cardiovascular: Negative.  Negative for chest pain, palpitations and leg swelling.  Gastrointestinal: Negative.  Negative for heartburn, nausea and vomiting.  Musculoskeletal: Negative.  Negative for myalgias.  Neurological: Negative.  Negative for dizziness, focal weakness, seizures and headaches.  Psychiatric/Behavioral: Negative.  Negative for suicidal ideas.     Past Medical History:  Diagnosis Date   Anxiety    Asthma    Closed displaced comminuted  fracture of shaft of right tibia 11/01/2017   Closed fracture of base of fifth metatarsal bone of right foot 11/13/2017   Closed fracture of medial malleolus of right ankle 10/31/2017   Depression    Headache    "only w/stress" (11/01/2017)   HIV (human immunodeficiency virus infection) (HCC)    Hypoglycemia due to insulin 06/27/2022   Migraine    "a few/month" (11/01/2017)   Refusal of blood transfusions as patient is Jehovah's Witness    Seizure (HCC)    "only w/my low blood sugars"  (11/01/2017)   Type I diabetes mellitus (HCC)     Past Surgical History:  Procedure Laterality Date   FRACTURE SURGERY     IM NAILING TIBIA Right 11/01/2017   INTRAMEDULLARY (IM) NAIL TIBIALRightGeneral   ORIF ANKLE FRACTURE Right 11/01/2017   ORIF ANKLE FRACTURE Right 11/01/2017   Procedure: OPEN REDUCTION INTERNAL FIXATION (ORIF) ANKLE FRACTURE;  Surgeon: Roby Lofts, MD;  Location: [facility] OR;  Service: Orthopedics;  Laterality: Right;   TIBIA IM NAIL INSERTION Right 11/01/2017   Procedure: INTRAMEDULLARY (IM) NAIL TIBIAL;  Surgeon: Roby Lofts, MD;  Location: [facility] OR;  Service: Orthopedics;  Laterality: Right;    Family History  Problem Relation Age of Onset   Thyroid disease Mother    Diabetes Mother    Breast cancer Maternal Grandmother    Diabetes Maternal Grandmother     Social History Reviewed with no changes to be made today.   Outpatient Medications Prior to Visit  Medication Sig Dispense Refill   Accu-Chek Softclix Lancets lancets Use to check blood sugar three times daily. 100 each 6   Continuous Glucose Sensor (DEXCOM G7 SENSOR) MISC Use to check blood sugar continuously. Change sensors once every 10 days. 3 each 3   elvitegravir-cobicistat-emtricitabine-tenofovir (GENVOYA) 150-150-200-10 MG TABS tablet Take 1 tablet by mouth daily with breakfast. 30 tablet 5   glucose blood (ACCU-CHEK GUIDE) test strip Use to check blood sugar 3 times daily. 100 each 6   mirtazapine (REMERON) 15 MG  tablet Take 0.5 tablets (7.5 mg total) by mouth at bedtime. 15 tablet 0   Continuous Glucose Receiver (DEXCOM G7 RECEIVER) DEVI Use to check blood sugar continuously 1 each 0   hydrALAZINE (APRESOLINE) 25 MG tablet Take 1 tablet (25 mg total) by mouth every 8 (eight) hours. For hypertension (Patient taking differently: Take 25 mg by mouth daily.) 90 tablet 2   insulin glargine (LANTUS SOLOSTAR) 100 UNIT/ML Solostar Pen Inject 18 Units into the skin daily. Make an appt with Dr. Delford Field for more refills. 6 mL 0   insulin lispro (HUMALOG) 100 UNIT/ML KwikPen Inject 1-6 Units into the skin as directed. ONLY with food - 1 unit per 15 grams of carbohydrates (bread, pasta, fruit, etc) 15 mL 11   sertraline (ZOLOFT) 100 MG tablet Take 1.5 tablets (150 mg total) by mouth daily. 45 tablet 3   Aspirin Effervescent (ALKA-SELTZER EXTRA STRENGTH PO) Take 1 tablet by mouth daily as needed (Cold sx). (Patient not taking: Reported on 01/06/2023)     DM-Doxylamine-Acetaminophen (NYQUIL COLD & FLU PO) Take 1 Dose by mouth every 6 (six) hours as needed (Cold  sx). (Patient not taking: Reported on 01/06/2023)     guaifenesin (ROBITUSSIN) 100 MG/5ML syrup Take 200 mg by mouth 3 (three) times daily as needed for cough. (Patient not taking: Reported on 01/06/2023)     valsartan-hydrochlorothiazide (DIOVAN-HCT) 320-25 MG tablet Take 1 tablet by mouth daily. (Patient not taking: Reported on 01/06/2023) 90 tablet 3   No facility-administered medications prior to visit.    Allergies  Allergen Reactions   Shellfish Allergy Anaphylaxis and Hives   Aspirin Other (See Comments)    Lump in armpit       Objective:    BP (!) 163/96   Pulse 78   Ht 6' (1.829 m)   Wt 186 lb 11.2 oz (84.7 kg)   SpO2 99%   BMI 25.32 kg/m  Wt Readings from Last 3 Encounters:  01/06/23 186 lb 11.2 oz (84.7 kg)  11/30/22 186 lb 15.2 oz (84.8 kg)  10/30/22 191 lb 12.8 oz (87 kg)    Physical Exam Vitals and nursing note reviewed.   Constitutional:      Appearance: He is well-developed.  HENT:     Head: Normocephalic and atraumatic.  Cardiovascular:     Rate and Rhythm: Normal rate and regular rhythm.     Heart sounds: Normal heart sounds. No murmur heard.    No friction rub. No gallop.  Pulmonary:     Effort: Pulmonary effort is normal. No tachypnea or respiratory distress.     Breath sounds: Normal breath sounds. No decreased breath sounds, wheezing, rhonchi or rales.  Chest:     Chest wall: No tenderness.  Abdominal:     General: Bowel sounds are normal.     Palpations: Abdomen is soft.  Musculoskeletal:        General: Normal range of motion.     Cervical back: Normal range of motion.  Skin:    General: Skin is warm and dry.  Neurological:     Mental Status: He is alert and oriented to person, place, and time.     Coordination: Coordination normal.  Psychiatric:        Behavior: Behavior normal. Behavior is cooperative.        Thought Content: Thought content normal.        Judgment: Judgment normal.          Patient has been counseled extensively about nutrition and exercise as well as the importance of adherence with medications and regular follow-up. The patient was given clear instructions to go to ER or return to medical center if symptoms don't improve, worsen or new problems develop. The patient verbalized understanding.   Follow-up: Return in about 2 months (around 03/08/2023).   Claiborne Rigg, FNP-BC [facility] [place], [place] [phone]   02/01/2023, 2:00 PM

## 2023-01-07 LAB — BASIC METABOLIC PANEL
BUN/Creatinine Ratio: 13 (ref 9–20)
BUN: 21 mg/dL — ABNORMAL HIGH (ref 6–20)
CO2: 22 mmol/L (ref 20–29)
Calcium: 9.7 mg/dL (ref 8.7–10.2)
Chloride: 103 mmol/L (ref 96–106)
Creatinine, Ser: 1.56 mg/dL — ABNORMAL HIGH (ref 0.76–1.27)
Glucose: 87 mg/dL (ref 70–99)
Potassium: 4.2 mmol/L (ref 3.5–5.2)
Sodium: 139 mmol/L (ref 134–144)
eGFR: 59 mL/min/{1.73_m2} — ABNORMAL LOW (ref >59)

## 2023-01-09 ENCOUNTER — Other Ambulatory Visit: Payer: Self-pay

## 2023-01-12 ENCOUNTER — Telehealth: Payer: Self-pay

## 2023-01-12 NOTE — Telephone Encounter (Signed)
-----   Message from Claiborne Rigg sent at 01/11/2023  8:48 AM EST ----- Kidney function has improved slightly.  At this time continue all medications as prescribed.

## 2023-01-12 NOTE — Telephone Encounter (Signed)
Called patient unable to make contact or leave voicemail, due to number being out of service.information sent to the nurse pool   Letter sent to home

## 2023-01-24 ENCOUNTER — Other Ambulatory Visit: Payer: Self-pay | Admitting: Critical Care Medicine

## 2023-01-24 ENCOUNTER — Other Ambulatory Visit: Payer: Self-pay

## 2023-01-24 DIAGNOSIS — E1021 Type 1 diabetes mellitus with diabetic nephropathy: Secondary | ICD-10-CM

## 2023-01-24 MED ORDER — DEXCOM G7 RECEIVER DEVI
0 refills | Status: DC
  Filled 2023-01-24: qty 1, 30d supply, fill #0

## 2023-01-25 ENCOUNTER — Other Ambulatory Visit: Payer: Self-pay

## 2023-01-27 ENCOUNTER — Other Ambulatory Visit: Payer: Self-pay

## 2023-01-30 ENCOUNTER — Other Ambulatory Visit: Payer: Self-pay

## 2023-02-01 ENCOUNTER — Encounter: Payer: Self-pay | Admitting: Nurse Practitioner

## 2023-02-01 ENCOUNTER — Other Ambulatory Visit: Payer: Self-pay

## 2023-02-01 MED ORDER — VALSARTAN-HYDROCHLOROTHIAZIDE 320-25 MG PO TABS
1.0000 | ORAL_TABLET | Freq: Every day | ORAL | 3 refills | Status: DC
  Filled 2023-02-01: qty 90, 90d supply, fill #0

## 2023-02-01 MED ORDER — INSULIN LISPRO (1 UNIT DIAL) 100 UNIT/ML (KWIKPEN)
6.0000 [IU] | PEN_INJECTOR | Freq: Three times a day (TID) | SUBCUTANEOUS | 11 refills | Status: DC
Start: 2023-02-01 — End: 2023-06-03
  Filled 2023-02-01 – 2023-03-23 (×2): qty 15, 84d supply, fill #0

## 2023-02-01 MED ORDER — HYDRALAZINE HCL 25 MG PO TABS
25.0000 mg | ORAL_TABLET | Freq: Three times a day (TID) | ORAL | 1 refills | Status: DC
Start: 2023-02-01 — End: 2023-06-03
  Filled 2023-02-01: qty 180, 60d supply, fill #0

## 2023-02-01 MED ORDER — SERTRALINE HCL 100 MG PO TABS
150.0000 mg | ORAL_TABLET | Freq: Every day | ORAL | 1 refills | Status: DC
  Filled 2023-02-01: qty 135, 90d supply, fill #0
  Filled 2023-03-23 – 2023-08-28 (×3): qty 135, 90d supply, fill #1

## 2023-02-01 MED ORDER — LANTUS SOLOSTAR 100 UNIT/ML ~~LOC~~ SOPN
18.0000 [IU] | PEN_INJECTOR | Freq: Every day | SUBCUTANEOUS | 3 refills | Status: DC
  Filled 2023-02-01 – 2023-03-23 (×2): qty 15, 83d supply, fill #0

## 2023-02-03 ENCOUNTER — Other Ambulatory Visit: Payer: Self-pay

## 2023-02-12 ENCOUNTER — Encounter (HOSPITAL_COMMUNITY): Payer: Self-pay

## 2023-02-12 ENCOUNTER — Other Ambulatory Visit: Payer: Self-pay

## 2023-02-12 ENCOUNTER — Emergency Department (HOSPITAL_COMMUNITY)
Admission: EM | Admit: 2023-02-12 | Discharge: 2023-02-12 | Disposition: A | Discharge status: Home / Self Care | Payer: MEDICAID | Attending: Student | Admitting: Student

## 2023-02-12 DIAGNOSIS — E10649 Type 1 diabetes mellitus with hypoglycemia without coma: Secondary | ICD-10-CM | POA: Insufficient documentation

## 2023-02-12 DIAGNOSIS — Z5900 Homelessness unspecified: Secondary | ICD-10-CM | POA: Diagnosis not present

## 2023-02-12 DIAGNOSIS — N183 Chronic kidney disease, stage 3 unspecified: Secondary | ICD-10-CM | POA: Insufficient documentation

## 2023-02-12 DIAGNOSIS — Z794 Long term (current) use of insulin: Secondary | ICD-10-CM | POA: Diagnosis not present

## 2023-02-12 DIAGNOSIS — Z79899 Other long term (current) drug therapy: Secondary | ICD-10-CM | POA: Insufficient documentation

## 2023-02-12 DIAGNOSIS — Z21 Asymptomatic human immunodeficiency virus [HIV] infection status: Secondary | ICD-10-CM | POA: Diagnosis not present

## 2023-02-12 DIAGNOSIS — Z87891 Personal history of nicotine dependence: Secondary | ICD-10-CM | POA: Diagnosis not present

## 2023-02-12 DIAGNOSIS — J45909 Unspecified asthma, uncomplicated: Secondary | ICD-10-CM | POA: Diagnosis not present

## 2023-02-12 DIAGNOSIS — R400 Somnolence: Secondary | ICD-10-CM | POA: Diagnosis present

## 2023-02-12 DIAGNOSIS — I129 Hypertensive chronic kidney disease with stage 1 through stage 4 chronic kidney disease, or unspecified chronic kidney disease: Secondary | ICD-10-CM | POA: Insufficient documentation

## 2023-02-12 DIAGNOSIS — E1022 Type 1 diabetes mellitus with diabetic chronic kidney disease: Secondary | ICD-10-CM | POA: Insufficient documentation

## 2023-02-12 DIAGNOSIS — E162 Hypoglycemia, unspecified: Secondary | ICD-10-CM

## 2023-02-12 LAB — URINALYSIS, ROUTINE W REFLEX MICROSCOPIC
Bilirubin Urine: NEGATIVE
Glucose, UA: NEGATIVE mg/dL
Ketones, ur: NEGATIVE mg/dL
Leukocytes,Ua: NEGATIVE
Nitrite: NEGATIVE
Protein, ur: >=300 mg/dL — AB
Specific Gravity, Urine: 1.016 (ref 1.005–1.030)
pH: 5 (ref 5.0–8.0)

## 2023-02-12 LAB — CBC WITH DIFFERENTIAL/PLATELET
Abs Immature Granulocytes: 0.03 10*3/uL (ref 0.00–0.07)
Basophils Absolute: 0.1 10*3/uL (ref 0.0–0.1)
Basophils Relative: 1 %
Eosinophils Absolute: 0.1 10*3/uL (ref 0.0–0.5)
Eosinophils Relative: 1 %
HCT: 44.6 % (ref 39.0–52.0)
Hemoglobin: 15 g/dL (ref 13.0–17.0)
Immature Granulocytes: 0 %
Lymphocytes Relative: 16 %
Lymphs Abs: 1.5 10*3/uL (ref 0.7–4.0)
MCH: 31.1 pg (ref 26.0–34.0)
MCHC: 33.6 g/dL (ref 30.0–36.0)
MCV: 92.3 fL (ref 80.0–100.0)
Monocytes Absolute: 0.8 10*3/uL (ref 0.1–1.0)
Monocytes Relative: 8 %
Neutro Abs: 7.3 10*3/uL (ref 1.7–7.7)
Neutrophils Relative %: 74 %
Platelets: 310 10*3/uL (ref 150–400)
RBC: 4.83 MIL/uL (ref 4.22–5.81)
RDW: 14.2 % (ref 11.5–15.5)
WBC: 9.8 10*3/uL (ref 4.0–10.5)
nRBC: 0 % (ref 0.0–0.2)

## 2023-02-12 LAB — RAPID URINE DRUG SCREEN, HOSP PERFORMED
Amphetamines: NOT DETECTED
Barbiturates: NOT DETECTED
Benzodiazepines: NOT DETECTED
Cocaine: NOT DETECTED
Opiates: NOT DETECTED
Tetrahydrocannabinol: POSITIVE — AB

## 2023-02-12 LAB — COMPREHENSIVE METABOLIC PANEL
ALT: 17 U/L (ref 0–44)
AST: 22 U/L (ref 15–41)
Albumin: 4.3 g/dL (ref 3.5–5.0)
Alkaline Phosphatase: 60 U/L (ref 38–126)
Anion gap: 10 (ref 5–15)
BUN: 27 mg/dL — ABNORMAL HIGH (ref 6–20)
CO2: 22 mmol/L (ref 22–32)
Calcium: 9.8 mg/dL (ref 8.9–10.3)
Chloride: 106 mmol/L (ref 98–111)
Creatinine, Ser: 1.62 mg/dL — ABNORMAL HIGH (ref 0.61–1.24)
GFR, Estimated: 56 mL/min — ABNORMAL LOW (ref >60)
Glucose, Bld: 71 mg/dL (ref 70–99)
Potassium: 4.1 mmol/L (ref 3.5–5.1)
Sodium: 138 mmol/L (ref 135–145)
Total Bilirubin: 0.7 mg/dL (ref <1.2)
Total Protein: 7.4 g/dL (ref 6.5–8.1)

## 2023-02-12 LAB — CBG MONITORING, ED
Glucose-Capillary: 89 mg/dL (ref 70–99)
Glucose-Capillary: 79 mg/dL (ref 70–99)
Glucose-Capillary: 111 mg/dL — ABNORMAL HIGH (ref 70–99)
Glucose-Capillary: 149 mg/dL — ABNORMAL HIGH (ref 70–99)

## 2023-02-12 MED ORDER — ONDANSETRON HCL 4 MG/2ML IJ SOLN
4.0000 mg | Freq: Once | INTRAMUSCULAR | Status: AC
  Administered 2023-02-12: 4 mg via INTRAVENOUS
  Filled 2023-02-12: qty 2

## 2023-02-12 NOTE — ED Triage Notes (Signed)
Pt bib ems, bystanders noticed pt was driving erratically, when EMS arrived to the pt he was diaphoretic and unresponsive. CBG 40, pt given 25G D10. CBG came up to 134. Pt responsive to voice on arrival

## 2023-02-12 NOTE — ED Notes (Signed)
Patient eating sandwich & apple sauce, drinking Ginger Ale.

## 2023-02-12 NOTE — ED Notes (Signed)
Pt given 8oz orange juice w/ 2 packets of sugar.

## 2023-02-12 NOTE — ED Notes (Signed)
Pt given orange juice.

## 2023-02-12 NOTE — ED Notes (Signed)
Pt given 8 oz orange juice.  

## 2023-02-12 NOTE — ED Notes (Signed)
ED Provider at bedside. 

## 2023-02-12 NOTE — ED Provider Notes (Signed)
[place] EMERGENCY DEPARTMENT AT [facility] Provider Note  CSN: [id number] Arrival date & time: 02/12/23 1516  Chief Complaint(s) No chief complaint on file.  HPI [name] is a 37 y.o. male with PMH type 1 diabetes on insulin, CKD 3, depression, anxiety, HIV with last CD4 count 732 in June 2024 on [place], HTN who presents emergency department for evaluation of somnolence and altered mental status.  Patient was reportedly leaving work and was driving erratically.  Was found altered by EMS in his vehicle and was found to have initial blood sugar of 40.  Was given 25 g of glucose by EMS with improvement of mental status.  Patient arrives somnolent but will awaken to voice and answer questions appropriately.  Does have multiple ER presentations including in August, June and October 2024 for recurrent hypoglycemia thought to be secondary to decreased p.o. intake.  Currently denying chest pain, shortness of breath, Donnell pain, nausea, vomiting or other systemic symptoms.   Past Medical History Past Medical History:  Diagnosis Date   Anxiety    Asthma    Closed displaced comminuted fracture of shaft of right tibia 11/01/2017   Closed fracture of base of fifth metatarsal bone of right foot 11/13/2017   Closed fracture of medial malleolus of right ankle 10/31/2017   Depression    Headache    "only w/stress" (11/01/2017)   HIV (human immunodeficiency virus infection) (HCC)    Hypoglycemia due to insulin 06/27/2022   Migraine    "a few/month" (11/01/2017)   Refusal of blood transfusions as patient is Jehovah's Witness    Seizure (HCC)    "only w/my low blood sugars"  (11/01/2017)   Type I diabetes mellitus (HCC)    Patient Active Problem List   Diagnosis Date Noted   Hypoglycemia 10/01/2022   HIV (human immunodeficiency virus infection) (HCC) 10/01/2022   CKD (chronic kidney disease) stage 2, GFR 60-89 ml/min 10/01/2022   Homelessness 08/16/2022   Hypocalcemia  03/21/2022   Routine screening for STI (sexually transmitted infection) 02/03/2022   Essential hypertension 09/28/2020   History of suicidal ideation 02/09/2020   MDD (major depressive disorder), recurrent episode, severe (HCC) 01/15/2020   Syphilis 01/15/2019   HIV infection (HCC) 11/11/2014   Asthma 10/02/2010   Type 1 diabetes (HCC) 03/01/1991   Home Medication(s) Prior to Admission medications   Medication Sig Start Date End Date Taking? Authorizing Provider  Accu-Chek Softclix Lancets lancets Use to check blood sugar three times daily. 08/16/22   Storm Frisk, MD  Continuous Glucose Receiver (DEXCOM G7 RECEIVER) DEVI Use to check blood sugar continuously 01/24/23   Hoy Register, MD  Continuous Glucose Sensor (DEXCOM G7 SENSOR) MISC Use to check blood sugar continuously. Change sensors once every 10 days. 08/16/22   Storm Frisk, MD  elvitegravir-cobicistat-emtricitabine-tenofovir (GENVOYA) 150-150-200-10 MG TABS tablet Take 1 tablet by mouth daily with breakfast. 09/14/22   Veryl Speak, FNP  glucose blood (ACCU-CHEK GUIDE) test strip Use to check blood sugar 3 times daily. 08/16/22   Storm Frisk, MD  hydrALAZINE (APRESOLINE) 25 MG tablet Take 1 tablet (25 mg total) by mouth every 8 (eight) hours. For hypertension 02/01/23   Claiborne Rigg, NP  insulin glargine (LANTUS SOLOSTAR) 100 UNIT/ML Solostar Pen Inject 18 Units into the skin daily. 02/01/23   Claiborne Rigg, NP  insulin lispro (HUMALOG) 100 UNIT/ML KwikPen Inject 6 Units into the skin with breakfast, with lunch, and with evening meal. For blood sugars 0-150  give 0 units of insulin, 151-200 give 2 units of insulin, 201-250 give 4 units, 251-300 give 6 units, 301-350 give 8 units, 351-400 give 10 units,> 400 give 12 units 02/01/23   Claiborne Rigg, NP  mirtazapine (REMERON) 15 MG tablet Take 0.5 tablets (7.5 mg total) by mouth at bedtime. 10/31/22 01/06/23  Hughie Closs, MD  sertraline (ZOLOFT) 100 MG tablet Take  1.5 tablets (150 mg total) by mouth daily. 02/01/23   Claiborne Rigg, NP  valsartan-hydrochlorothiazide (DIOVAN-HCT) 320-25 MG tablet Take 1 tablet by mouth daily. 02/01/23   Claiborne Rigg, NP  pravastatin (PRAVACHOL) 20 MG tablet Take 1 tablet (20 mg total) by mouth daily. Patient not taking: Reported on 03/31/2019 03/08/19 07/08/19  Claiborne Rigg, NP                                                                                                                                    Past Surgical History Past Surgical History:  Procedure Laterality Date   FRACTURE SURGERY     IM NAILING TIBIA Right 11/01/2017   INTRAMEDULLARY (IM) NAIL TIBIALRightGeneral   ORIF ANKLE FRACTURE Right 11/01/2017   ORIF ANKLE FRACTURE Right 11/01/2017   Procedure: OPEN REDUCTION INTERNAL FIXATION (ORIF) ANKLE FRACTURE;  Surgeon: Roby Lofts, MD;  Location: [facility] OR;  Service: Orthopedics;  Laterality: Right;   TIBIA IM NAIL INSERTION Right 11/01/2017   Procedure: INTRAMEDULLARY (IM) NAIL TIBIAL;  Surgeon: Roby Lofts, MD;  Location: [facility] OR;  Service: Orthopedics;  Laterality: Right;   Family History Family History  Problem Relation Age of Onset   Thyroid disease Mother    Diabetes Mother    Breast cancer Maternal Grandmother    Diabetes Maternal Grandmother     Social History Social History   Tobacco Use   Smoking status: Former    Types: Cigars    Start date: 09/29/2015    Quit date: 12/16/2022    Years since quitting: 0.1   Smokeless tobacco: Never   Tobacco comments:    11/01/2017  "2 cigarettes in ~ 6 month "  Vaping Use   Vaping status: Former   Substances: THC  Substance Use Topics   Alcohol use: Not Currently    Alcohol/week: 7.0 standard drinks of alcohol    Types: 7 Cans of beer per week   Drug use: Not Currently    Frequency: 2.0 times per week    Types: Marijuana    Comment: 11/01/2017  "weekly"   Allergies Shellfish allergy and Aspirin  Review of Systems Review of Systems   Constitutional:  Positive for fatigue.  Psychiatric/Behavioral:  Positive for confusion.     Physical Exam Vital Signs  I have reviewed the triage vital signs There were no vitals taken for this visit.  Physical Exam Constitutional:      General: He is not in acute distress.    Appearance: Normal appearance.  HENT:  Head: Normocephalic and atraumatic.     Nose: No congestion or rhinorrhea.  Eyes:     General:        Right eye: No discharge.        Left eye: No discharge.     Extraocular Movements: Extraocular movements intact.     Pupils: Pupils are equal, round, and reactive to light.  Cardiovascular:     Rate and Rhythm: Normal rate and regular rhythm.     Heart sounds: No murmur heard. Pulmonary:     Effort: No respiratory distress.     Breath sounds: No wheezing or rales.  Abdominal:     General: There is no distension.     Tenderness: There is no abdominal tenderness.  Musculoskeletal:        General: Normal range of motion.     Cervical back: Normal range of motion.  Skin:    General: Skin is warm and dry.  Neurological:     General: No focal deficit present.     Mental Status: He is alert.     ED Results and Treatments Labs (all labs ordered are listed, but only abnormal results are displayed) Labs Reviewed  COMPREHENSIVE METABOLIC PANEL  CBC WITH DIFFERENTIAL/PLATELET  CBG MONITORING, ED                                                                                                                          Radiology No results found.  Pertinent labs & imaging results that were available during my care of the patient were reviewed by me and considered in my medical decision making (see MDM for details).  Medications Ordered in ED Medications - No data to display                                                                                                                                   Procedures Procedures  (including critical care  time)  Medical Decision Making / ED Course   This patient presents to the ED for concern of hypoglycemia, this involves an extensive number of treatment options, and is a complaint that carries with it a high risk of complications and morbidity.  The differential diagnosis includes decreased p.o. intake, exogenous insulin use, infection, substance use  MDM: Patient seen emergency room for evaluation of somnolence and hypoglycemia.  Physical exam reveals a somnolent appearing patient who will awaken to  verbal stimuli but then quickly falls asleep.  Laboratory evaluation with a BUN of 27, creatinine 1.62 which is not far from patient's baseline.  Initial glucose 89.  Follow-up glucose minimally downtrending to 79.  Patient able to tolerate p.o. orange juice and repeat glucose uptrending to 111 and on final recheck 149.  UDS obtained in the setting of somnolence that is positive for [facility] but is otherwise unremarkable.  On additional reevaluation, patient mental status is improving he is able to ambulate without difficulty.  Tolerating p.o. without difficulty here in the ER.  Patient states that he has been working many night shifts in a row and feels tired from this.  At this time with overall negative workup he does not meet inpatient criteria for admission and will be discharged with outpatient follow-up.  Return precautions given of which he voiced understanding   Additional history obtained:  -External records from outside source obtained and reviewed including: Chart review including previous notes, labs, imaging, consultation notes   Lab Tests: -I ordered, reviewed, and interpreted labs.   The pertinent results include:   Labs Reviewed  COMPREHENSIVE METABOLIC PANEL  CBC WITH DIFFERENTIAL/PLATELET  CBG MONITORING, ED      Medicines ordered and prescription drug management: No orders of the defined types were placed in this encounter.   -I have reviewed the patients home medicines and  have made adjustments as needed  Critical interventions none  Cardiac Monitoring: The patient was maintained on a cardiac monitor.  I personally viewed and interpreted the cardiac monitored which showed an underlying rhythm of: NSR  Social Determinants of Health:  Factors impacting patients care include: none   Reevaluation: After the interventions noted above, I reevaluated the patient and found that they have :improved  Co morbidities that complicate the patient evaluation  Past Medical History:  Diagnosis Date   Anxiety    Asthma    Closed displaced comminuted fracture of shaft of right tibia 11/01/2017   Closed fracture of base of fifth metatarsal bone of right foot 11/13/2017   Closed fracture of medial malleolus of right ankle 10/31/2017   Depression    Headache    "only w/stress" (11/01/2017)   HIV (human immunodeficiency virus infection) (HCC)    Hypoglycemia due to insulin 06/27/2022   Migraine    "a few/month" (11/01/2017)   Refusal of blood transfusions as patient is Jehovah's Witness    Seizure (HCC)    "only w/my low blood sugars"  (11/01/2017)   Type I diabetes mellitus (HCC)       Dispostion: I considered admission for this patient, but at this time he does not meet inpatient criteria for admission and will be discharged with outpatient follow-up     Final Clinical Impression(s) / ED Diagnoses Final diagnoses:  None     @PCDICTATION @    Glendora Score, MD 02/12/23 2231

## 2023-03-23 ENCOUNTER — Other Ambulatory Visit: Payer: Self-pay

## 2023-03-23 DIAGNOSIS — Z113 Encounter for screening for infections with a predominantly sexual mode of transmission: Secondary | ICD-10-CM

## 2023-03-23 DIAGNOSIS — Z21 Asymptomatic human immunodeficiency virus [HIV] infection status: Secondary | ICD-10-CM

## 2023-03-24 ENCOUNTER — Other Ambulatory Visit: Payer: Self-pay

## 2023-03-24 ENCOUNTER — Other Ambulatory Visit: Payer: MEDICAID

## 2023-03-24 ENCOUNTER — Ambulatory Visit: Payer: MEDICAID

## 2023-03-24 DIAGNOSIS — Z113 Encounter for screening for infections with a predominantly sexual mode of transmission: Secondary | ICD-10-CM

## 2023-03-24 DIAGNOSIS — Z21 Asymptomatic human immunodeficiency virus [HIV] infection status: Secondary | ICD-10-CM

## 2023-03-26 LAB — URINE CYTOLOGY ANCILLARY ONLY
Chlamydia: NEGATIVE
Comment: NEGATIVE
Comment: NORMAL
Neisseria Gonorrhea: NEGATIVE

## 2023-03-28 LAB — CBC WITH DIFFERENTIAL/PLATELET
Absolute Lymphocytes: 1555 {cells}/uL (ref 850–3900)
Absolute Monocytes: 343 {cells}/uL (ref 200–950)
Basophils Absolute: 22 {cells}/uL (ref 0–200)
Basophils Relative: 0.3 %
Eosinophils Absolute: 29 {cells}/uL (ref 15–500)
Eosinophils Relative: 0.4 %
HCT: 47.1 % (ref 38.5–50.0)
Hemoglobin: 15.3 g/dL (ref 13.2–17.1)
MCH: 30.4 pg (ref 27.0–33.0)
MCHC: 32.5 g/dL (ref 32.0–36.0)
MCV: 93.5 fL (ref 80.0–100.0)
MPV: 10.9 fL (ref 7.5–12.5)
Monocytes Relative: 4.7 %
Neutro Abs: 5351 {cells}/uL (ref 1500–7800)
Neutrophils Relative %: 73.3 %
Platelets: 288 10*3/uL (ref 140–400)
RBC: 5.04 10*6/uL (ref 4.20–5.80)
RDW: 12.4 % (ref 11.0–15.0)
Total Lymphocyte: 21.3 %
WBC: 7.3 10*3/uL (ref 3.8–10.8)

## 2023-03-28 LAB — COMPLETE METABOLIC PANEL WITH GFR
AG Ratio: 1.5 (calc) (ref 1.0–2.5)
ALT: 14 U/L (ref 9–46)
AST: 16 U/L (ref 10–40)
Albumin: 4.4 g/dL (ref 3.6–5.1)
Alkaline phosphatase (APISO): 65 U/L (ref 36–130)
BUN/Creatinine Ratio: 16 (calc) (ref 6–22)
BUN: 30 mg/dL — ABNORMAL HIGH (ref 7–25)
CO2: 24 mmol/L (ref 20–32)
Calcium: 9.9 mg/dL (ref 8.6–10.3)
Chloride: 100 mmol/L (ref 98–110)
Creat: 1.9 mg/dL — ABNORMAL HIGH (ref 0.60–1.26)
Globulin: 3 g/dL (ref 1.9–3.7)
Glucose, Bld: 343 mg/dL — ABNORMAL HIGH (ref 65–99)
Potassium: 4.5 mmol/L (ref 3.5–5.3)
Sodium: 135 mmol/L (ref 135–146)
Total Bilirubin: 0.6 mg/dL (ref 0.2–1.2)
Total Protein: 7.4 g/dL (ref 6.1–8.1)
eGFR: 46 mL/min/{1.73_m2} — ABNORMAL LOW (ref >=60)

## 2023-03-28 LAB — LIPID PANEL
Cholesterol: 191 mg/dL (ref <200)
HDL: 95 mg/dL (ref >=40)
LDL Cholesterol (Calc): 81 mg/dL
Non-HDL Cholesterol (Calc): 96 mg/dL (ref <130)
Total CHOL/HDL Ratio: 2 (calc) (ref <5.0)
Triglycerides: 69 mg/dL (ref <150)

## 2023-03-28 LAB — HIV-1 RNA QUANT-NO REFLEX-BLD
HIV 1 RNA Quant: NOT DETECTED {copies}/mL
HIV-1 RNA Quant, Log: NOT DETECTED {Log}

## 2023-03-28 LAB — T-HELPER CELLS (CD4) COUNT (NOT AT ARMC)
Absolute CD4: 573 {cells}/uL (ref 490–1740)
CD4 T Helper %: 32 % (ref 30–61)
Total lymphocyte count: 1767 {cells}/uL (ref 850–3900)

## 2023-03-28 LAB — RPR: RPR Ser Ql: NONREACTIVE

## 2023-04-18 ENCOUNTER — Ambulatory Visit: Payer: MEDICAID | Admitting: Family

## 2023-04-19 ENCOUNTER — Encounter (HOSPITAL_COMMUNITY): Payer: Self-pay

## 2023-04-19 ENCOUNTER — Ambulatory Visit (HOSPITAL_COMMUNITY)
Admission: EM | Admit: 2023-04-19 | Discharge: 2023-04-19 | Disposition: A | Discharge status: Home / Self Care | Payer: MEDICAID | Attending: Sports Medicine | Admitting: Sports Medicine

## 2023-04-19 DIAGNOSIS — E108 Type 1 diabetes mellitus with unspecified complications: Secondary | ICD-10-CM | POA: Diagnosis not present

## 2023-04-19 DIAGNOSIS — B20 Human immunodeficiency virus [HIV] disease: Secondary | ICD-10-CM | POA: Diagnosis not present

## 2023-04-19 DIAGNOSIS — R197 Diarrhea, unspecified: Secondary | ICD-10-CM

## 2023-04-19 LAB — POCT FASTING CBG KUC MANUAL ENTRY: POCT Glucose (KUC): 376 mg/dL — AB (ref 70–99)

## 2023-04-19 NOTE — ED Provider Notes (Signed)
[facility]    CSN: [id number] Arrival date & time: 04/19/23  4098      History   Chief Complaint Chief Complaint  Patient presents with   Nausea   Diarrhea   Abdominal Pain    HPI [name] is a 38 y.o. male.   [name] is a 37yo with PMH of HIV well controlled, Seizure disorder and T1DM here with 7 days of diarrhea and crampy abdominal pain.  He is having 5+ BMs daily, initially had more form though over the past 3 days have become watery. He has associated crampy abdominal pain which he locates in the epigastric area with nausea, though no emesis. Stools are orange-brown. Had one episode of blood in the stool yesterday, but multiple Bms since without blood. Denies history of similar episodes. No recent travel, antibiotics, or medication changes. Denies fevers or chills. Endorses decreased appetite, though has been able to tolerate solids and liquids and doesn't feel he is dehydrated. Has not taken his insulin today.  The history is provided by the patient.  Diarrhea Associated symptoms: abdominal pain   Associated symptoms: no chills, no diaphoresis, no fever and no vomiting   Abdominal Pain Associated symptoms: diarrhea, fatigue and nausea   Associated symptoms: no chest pain, no chills, no cough, no dysuria, no fever, no shortness of breath and no vomiting     Past Medical History:  Diagnosis Date   Anxiety    Asthma    Closed displaced comminuted fracture of shaft of right tibia 11/01/2017   Closed fracture of base of fifth metatarsal bone of right foot 11/13/2017   Closed fracture of medial malleolus of right ankle 10/31/2017   Depression    Headache    "only w/stress" (11/01/2017)   HIV (human immunodeficiency virus infection) (HCC)    Hypoglycemia due to insulin 06/27/2022   Migraine    "a few/month" (11/01/2017)   Refusal of blood transfusions as patient is Jehovah's Witness    Seizure (HCC)    "only w/my low blood sugars"  (11/01/2017)    Type I diabetes mellitus (HCC)     Patient Active Problem List   Diagnosis Date Noted   Hypoglycemia 10/01/2022   HIV (human immunodeficiency virus infection) (HCC) 10/01/2022   CKD (chronic kidney disease) stage 2, GFR 60-89 ml/min 10/01/2022   Homelessness 08/16/2022   Hypocalcemia 03/21/2022   Routine screening for STI (sexually transmitted infection) 02/03/2022   Essential hypertension 09/28/2020   History of suicidal ideation 02/09/2020   MDD (major depressive disorder), recurrent episode, severe (HCC) 01/15/2020   Syphilis 01/15/2019   HIV infection (HCC) 11/11/2014   Asthma 10/02/2010   Type 1 diabetes (HCC) 03/01/1991    Past Surgical History:  Procedure Laterality Date   FRACTURE SURGERY     IM NAILING TIBIA Right 11/01/2017   INTRAMEDULLARY (IM) NAIL TIBIALRightGeneral   ORIF ANKLE FRACTURE Right 11/01/2017   ORIF ANKLE FRACTURE Right 11/01/2017   Procedure: OPEN REDUCTION INTERNAL FIXATION (ORIF) ANKLE FRACTURE;  Surgeon: Roby Lofts, MD;  Location: [facility] OR;  Service: Orthopedics;  Laterality: Right;   TIBIA IM NAIL INSERTION Right 11/01/2017   Procedure: INTRAMEDULLARY (IM) NAIL TIBIAL;  Surgeon: Roby Lofts, MD;  Location: [facility] OR;  Service: Orthopedics;  Laterality: Right;       Home Medications    Prior to Admission medications   Medication Sig Start Date End Date Taking? Authorizing Provider  Camphor-Menthol-Methyl Sal (TIGER BALM MUSCLE RUB EX) Apply 1 Application topically as  needed.    [provider]  elvitegravir-cobicistat-emtricitabine-tenofovir (GENVOYA) 150-150-200-10 MG TABS tablet Take 1 tablet by mouth daily with breakfast. 09/14/22   Veryl Speak, FNP  hydrALAZINE (APRESOLINE) 25 MG tablet Take 1 tablet (25 mg total) by mouth every 8 (eight) hours. For hypertension Patient taking differently: Take 50 mg by mouth every 8 (eight) hours. For hypertension 02/01/23   Claiborne Rigg, NP  ibuprofen (ADVIL) 200 MG tablet Take 600 mg by  mouth as needed for mild pain (pain score 1-3) or headache.    [provider]  insulin glargine (LANTUS SOLOSTAR) 100 UNIT/ML Solostar Pen Inject 18 Units into the skin daily. 02/01/23   Claiborne Rigg, NP  insulin lispro (HUMALOG) 100 UNIT/ML KwikPen Inject 6 Units into the skin with breakfast, with lunch, and with evening meal. For blood sugars 0-150 give 0 units of insulin, 151-200 give 2 units of insulin, 201-250 give 4 units, 251-300 give 6 units, 301-350 give 8 units, 351-400 give 10 units,> 400 give 12 units Patient taking differently: Inject 6-12 Units into the skin with breakfast, with lunch, and with evening meal. For blood sugars 0-150 give 0 units of insulin, 151-200 give 2 units of insulin, 201-250 give 4 units, 251-300 give 6 units, 301-350 give 8 units, 351-400 give 10 units,> 400 give 12 units 02/01/23   Claiborne Rigg, NP  mirtazapine (REMERON) 15 MG tablet Take 0.5 tablets (7.5 mg total) by mouth at bedtime. Patient taking differently: Take 7.5 mg by mouth 3 (three) times a week. 10/31/22 04/06/23  Hughie Closs, MD  sertraline (ZOLOFT) 100 MG tablet Take 1.5 tablets (150 mg total) by mouth daily. 02/01/23   Claiborne Rigg, NP  valsartan-hydrochlorothiazide (DIOVAN-HCT) 320-25 MG tablet Take 1 tablet by mouth daily. 02/01/23   Claiborne Rigg, NP  pravastatin (PRAVACHOL) 20 MG tablet Take 1 tablet (20 mg total) by mouth daily. Patient not taking: Reported on 03/31/2019 03/08/19 07/08/19  Claiborne Rigg, NP    Family History Family History  Problem Relation Age of Onset   Thyroid disease Mother    Diabetes Mother    Breast cancer Maternal Grandmother    Diabetes Maternal Grandmother     Social History Social History   Tobacco Use   Smoking status: Former    Types: Cigars    Start date: 09/29/2015    Quit date: 12/16/2022    Years since quitting: 0.3   Smokeless tobacco: Never   Tobacco comments:    11/01/2017  "2 cigarettes in ~ 6 month "  Vaping Use   Vaping  status: Former   Substances: THC  Substance Use Topics   Alcohol use: Not Currently    Alcohol/week: 7.0 standard drinks of alcohol    Types: 7 Cans of beer per week   Drug use: Not Currently    Frequency: 2.0 times per week    Types: Marijuana    Comment: 11/01/2017  "weekly"     Allergies   Shellfish allergy and Aspirin   Review of Systems Review of Systems  Constitutional:  Positive for appetite change and fatigue. Negative for chills, diaphoresis and fever.  Respiratory:  Negative for cough and shortness of breath.   Cardiovascular:  Negative for chest pain and leg swelling.  Gastrointestinal:  Positive for abdominal pain, blood in stool, diarrhea and nausea. Negative for abdominal distention and vomiting.  Genitourinary:  Negative for dysuria, flank pain and frequency.  Skin:  Negative for rash.  Physical Exam Triage Vital Signs ED Triage Vitals  Encounter Vitals Group     BP 04/19/23 0952 136/87     Systolic BP Percentile --      Diastolic BP Percentile --      Pulse Rate 04/19/23 0952 98     Resp 04/19/23 0952 16     Temp 04/19/23 0952 98.7 F (37.1 C)     Temp Source 04/19/23 0952 Oral     SpO2 04/19/23 0952 98 %     Weight --      Height --      Head Circumference --      Peak Flow --      Pain Score 04/19/23 0831 6     Pain Loc --      Pain Education --      Exclude from Growth Chart --    No data found.  Updated Vital Signs BP 136/87 (BP Location: Left Arm)   Pulse 98   Temp 98.7 F (37.1 C) (Oral)   Resp 16   SpO2 98%   Visual Acuity Right Eye Distance:   Left Eye Distance:   Bilateral Distance:    Right Eye Near:   Left Eye Near:    Bilateral Near:     Physical Exam Constitutional:      General: He is not in acute distress.    Appearance: He is well-developed. He is not ill-appearing or diaphoretic.  HENT:     Head: Normocephalic and atraumatic.     Mouth/Throat:     Mouth: Mucous membranes are moist.     Pharynx: Oropharynx  is clear.  Eyes:     Extraocular Movements: Extraocular movements intact.     Pupils: Pupils are equal, round, and reactive to light.  Cardiovascular:     Rate and Rhythm: Normal rate and regular rhythm.     Heart sounds: Murmur heard.     No friction rub. No gallop.  Pulmonary:     Effort: Pulmonary effort is normal.     Breath sounds: Normal breath sounds.  Abdominal:     General: Abdomen is flat. Bowel sounds are normal. There is no distension.     Palpations: Abdomen is soft. There is no hepatomegaly or mass.     Tenderness: There is abdominal tenderness in the epigastric area. There is no guarding or rebound. Negative signs include Murphy's sign and McBurney's sign.     Hernia: A hernia is present. Hernia is present in the umbilical area.     Comments: Umbilical hernia easily reducible  Skin:    Capillary Refill: Capillary refill takes less than 2 seconds.  Neurological:     General: No focal deficit present.     Mental Status: He is alert and oriented to person, place, and time.     Cranial Nerves: No cranial nerve deficit.  Psychiatric:        Mood and Affect: Mood normal.        Behavior: Behavior normal.      UC Treatments / Results  Labs (all labs ordered are listed, but only abnormal results are displayed) Labs Reviewed  POCT FASTING CBG [facility] MANUAL ENTRY - Abnormal; Notable for the following components:      Result Value   POCT Glucose ([facility]) 376 (*)    All other components within normal limits  STOOL CULTURE  OVA + PARASITE EXAM  GASTROINTESTINAL PANEL BY PCR, STOOL (REPLACES STOOL CULTURE)    EKG   Radiology  No results found.  Procedures Procedures (including critical care time)  Medications Ordered in UC Medications - No data to display  Initial Impression / Assessment and Plan / UC Course  I have reviewed the triage vital signs and the nursing notes.  Pertinent labs & imaging results that were available during my care of the patient were  reviewed by me and considered in my medical decision making (see chart for details).    Vitals and triage reviewed, patient is hemodynamically stable.  He has had 1 episode of bloody bowel movements that sounds to be more associated with the frequency of his bowel movements and likely some perianal trauma rather than a upper or lower abdominal bleed.  No black or tarry stools.  His abdominal exam is benign.  Given his history of HIV though fairly well-controlled on medications (with undetectable HIV viral load and CD4 count of 573 on review of his labs from last month) I do suspect an infectious etiology of his diarrhea. He is afebrile.   Workup today includes stool culture and PCR as well as stool O & P.  Recommended supportive care while awaiting his lab results above. Return precautions reviewed. Advised PCP follow-up in 5-7 days if failing to improve as he may need referral to GI for endoscopy if this is failing to improve. Patient's questions were answered and they are in agreement with this plan.   Final Clinical Impressions(s) / UC Diagnoses   Final diagnoses:  Diarrhea of presumed infectious origin  HIV disease [facility])     Discharge Instructions      You were seen today for diarrhea. I have ordered cultures to be done to your stool and recommend you return soon with a stool collection so we can have it tested in the lab.  If this isn't improving over the next week I would like for you to follow-up with your primary care provider to refer you to GI.  Patient Instruction for managing diarrhea Hydration:  It is crucial to stay hydrated. Drink plenty of fluids such as water, oral rehydration solutions (ORS), or clear broths. Avoid sugary drinks like sodas and juices, as they can worsen diarrhea.  For children, use ORS to prevent dehydration. ORS can be found in most pharmacies and should be prepared according to the instructions on the packet.  Dietary Modifications:  Avoid dairy  products, high-fiber foods, and fatty foods until the diarrhea subsides.  Eat small, frequent meals consisting of bland foods like bananas, rice, applesauce, and toast (the BRAT diet). Medications:  Over-the-counter medications like loperamide (Imodium) can help reduce the frequency of stools. Start with 4 mg, followed by 2 mg after each loose stool, not exceeding 16 mg per day.  Avoid using antimotility agents (like imodium) if you have bloody diarrhea or a high fever   ED Prescriptions   None    PDMP not reviewed this encounter.   Marisa Cyphers, MD 04/19/23 1013

## 2023-04-19 NOTE — Discharge Instructions (Signed)
You were seen today for diarrhea. I have ordered cultures to be done to your stool and recommend you return soon with a stool collection so we can have it tested in the lab.  If this isn't improving over the next week I would like for you to follow-up with your primary care provider to refer you to GI.  Patient Instruction for managing diarrhea Hydration:  It is crucial to stay hydrated. Drink plenty of fluids such as water, oral rehydration solutions (ORS), or clear broths. Avoid sugary drinks like sodas and juices, as they can worsen diarrhea.  For children, use ORS to prevent dehydration. ORS can be found in most pharmacies and should be prepared according to the instructions on the packet.  Dietary Modifications:  Avoid dairy products, high-fiber foods, and fatty foods until the diarrhea subsides.  Eat small, frequent meals consisting of bland foods like bananas, rice, applesauce, and toast (the BRAT diet). Medications:  Over-the-counter medications like loperamide (Imodium) can help reduce the frequency of stools. Start with 4 mg, followed by 2 mg after each loose stool, not exceeding 16 mg per day.  Avoid using antimotility agents (like imodium) if you have bloody diarrhea or a high fever

## 2023-04-19 NOTE — ED Triage Notes (Signed)
Patient reports that he has had nausea and diarrhea x 1 week. Patient states he saw blood in his stool last night and states it was light red in color and turned the water to red.  Patient also c/o mid and lower abdominal pain x 3 days.   Patient states he has been taking chewable and liquid Pepto Bismol Patient states he has been taking an OTC [employer] medication for diarrhea, but did not know the name.

## 2023-04-19 NOTE — ED Notes (Signed)
Patient given stool collection kit and instructed on this. Patient voiced understanding.

## 2023-05-23 NOTE — Progress Notes (Deleted)
 HPI: [name] is a 38 y.o. male who presents to the [facility] pharmacy clinic for HIV follow-up.  Patient Active Problem List   Diagnosis Date Noted   Hypoglycemia 10/01/2022   HIV (human immunodeficiency virus infection) (HCC) 10/01/2022   CKD (chronic kidney disease) stage 2, GFR 60-89 ml/min 10/01/2022   Homelessness 08/16/2022   Hypocalcemia 03/21/2022   Routine screening for STI (sexually transmitted infection) 02/03/2022   Essential hypertension 09/28/2020   History of suicidal ideation 02/09/2020   MDD (major depressive disorder), recurrent episode, severe (HCC) 01/15/2020   Syphilis 01/15/2019   HIV infection (HCC) 11/11/2014   Asthma 10/02/2010   Type 1 diabetes (HCC) 03/01/1991    Patient's Medications  New Prescriptions   No medications on file  Previous Medications   CAMPHOR-MENTHOL-METHYL SAL (TIGER BALM MUSCLE RUB EX)    Apply 1 Application topically as needed.   ELVITEGRAVIR-COBICISTAT-EMTRICITABINE-TENOFOVIR (GENVOYA) 150-150-200-10 MG TABS TABLET    Take 1 tablet by mouth daily with breakfast.   HYDRALAZINE (APRESOLINE) 25 MG TABLET    Take 1 tablet (25 mg total) by mouth every 8 (eight) hours. For hypertension   IBUPROFEN (ADVIL) 200 MG TABLET    Take 600 mg by mouth as needed for mild pain (pain score 1-3) or headache.   INSULIN GLARGINE (LANTUS SOLOSTAR) 100 UNIT/ML SOLOSTAR PEN    Inject 18 Units into the skin daily.   INSULIN LISPRO (HUMALOG) 100 UNIT/ML KWIKPEN    Inject 6 Units into the skin with breakfast, with lunch, and with evening meal. For blood sugars 0-150 give 0 units of insulin, 151-200 give 2 units of insulin, 201-250 give 4 units, 251-300 give 6 units, 301-350 give 8 units, 351-400 give 10 units,> 400 give 12 units   MIRTAZAPINE (REMERON) 15 MG TABLET    Take 0.5 tablets (7.5 mg total) by mouth at bedtime.   SERTRALINE (ZOLOFT) 100 MG TABLET    Take 1.5 tablets (150 mg total) by mouth daily.   VALSARTAN-HYDROCHLOROTHIAZIDE (DIOVAN-HCT)  320-25 MG TABLET    Take 1 tablet by mouth daily.  Modified Medications   No medications on file  Discontinued Medications   No medications on file    Labs: Lab Results  Component Value Date   HIV1RNAQUANT Not Detected 03/24/2023   HIV1RNAQUANT Not Detected 08/03/2022   HIV1RNAQUANT Not Detected 02/03/2022   CD4TABS 732 08/03/2022   CD4TABS 520 02/03/2022   CD4TABS 535 10/21/2020    RPR and STI Lab Results  Component Value Date   LABRPR NON-REACTIVE 03/24/2023   LABRPR REACTIVE (A) 08/03/2022   LABRPR REACTIVE (A) 02/03/2022   LABRPR REACTIVE (A) 11/25/2020   LABRPR REACTIVE (A) 10/21/2020   RPRTITER 1:32 (H) 08/03/2022   RPRTITER 1:16 (H) 02/03/2022   RPRTITER 1:32 (H) 11/25/2020   RPRTITER 1:32 (H) 10/21/2020   RPRTITER 1:16 (H) 03/10/2020    STI Results GC CT  03/24/2023  9:20 AM Negative  Negative   02/03/2022 10:36 AM Negative    Negative    Negative  Negative    Negative    Negative   11/25/2020 11:15 AM Negative    Negative    Negative  Negative    Negative    Negative   04/17/2018 12:00 AM Negative    Negative    Negative  Negative    Negative    Negative   01/07/2016 12:00 AM Negative  Negative   09/24/2015 12:00 AM ***POSITIVE**    *Negative    Negative  *Negative    *  Negative    Negative   10/22/2014 12:00 AM Negative  Negative     Hepatitis B Lab Results  Component Value Date   HEPBSAB POS (A) 10/21/2014   HEPBSAG NEGATIVE 10/21/2014   HEPBCAB NON REACTIVE 10/21/2014   Hepatitis C No results found for: "HEPCAB", "HCVRNAPCRQN" Hepatitis A Lab Results  Component Value Date   HAV NON-REACTIVE 12/06/2019   Lipids: Lab Results  Component Value Date   CHOL 191 03/24/2023   TRIG 69 03/24/2023   HDL 95 03/24/2023   CHOLHDL 2.0 03/24/2023   VLDL 10 09/24/2020   LDLCALC 81 03/24/2023    Current HIV Regimen: Genvoya  Assessment: [name] is here today to follow up for his HIV infections. He has missed multiple appointments lately  and was last seen by Tammy Sours on 08/03/22 where he was having issues with his depression but taking his Genvoya every day. His last HIV viral load was not detected back on 03/24/23.   He comes in today for STI testing.   Plan: ***  [name], PharmD, BCIDP, AAHIVP, CPP Clinical Pharmacist Practitioner Infectious Diseases Clinical Pharmacist [facility] 05/23/2023, 3:50 PM

## 2023-05-24 ENCOUNTER — Other Ambulatory Visit: Payer: Self-pay

## 2023-05-24 ENCOUNTER — Ambulatory Visit: Payer: MEDICAID | Admitting: Pharmacist

## 2023-05-24 DIAGNOSIS — Z113 Encounter for screening for infections with a predominantly sexual mode of transmission: Secondary | ICD-10-CM

## 2023-05-29 ENCOUNTER — Other Ambulatory Visit: Payer: Self-pay

## 2023-05-29 ENCOUNTER — Ambulatory Visit (INDEPENDENT_AMBULATORY_CARE_PROVIDER_SITE_OTHER): Payer: MEDICAID | Admitting: Pharmacist

## 2023-05-29 DIAGNOSIS — Z113 Encounter for screening for infections with a predominantly sexual mode of transmission: Secondary | ICD-10-CM | POA: Diagnosis not present

## 2023-05-29 NOTE — Progress Notes (Signed)
 HPI: [name] is a 38 y.o. male who presents to the [facility] pharmacy clinic for HIV follow-up.  Patient Active Problem List   Diagnosis Date Noted   Hypoglycemia 10/01/2022   HIV (human immunodeficiency virus infection) (HCC) 10/01/2022   CKD (chronic kidney disease) stage 2, GFR 60-89 ml/min 10/01/2022   Homelessness 08/16/2022   Hypocalcemia 03/21/2022   Routine screening for STI (sexually transmitted infection) 02/03/2022   Essential hypertension 09/28/2020   History of suicidal ideation 02/09/2020   MDD (major depressive disorder), recurrent episode, severe (HCC) 01/15/2020   Syphilis 01/15/2019   HIV infection (HCC) 11/11/2014   Asthma 10/02/2010   Type 1 diabetes (HCC) 03/01/1991    Patient's Medications  New Prescriptions   No medications on file  Previous Medications   CAMPHOR-MENTHOL-METHYL SAL (TIGER BALM MUSCLE RUB EX)    Apply 1 Application topically as needed.   ELVITEGRAVIR-COBICISTAT-EMTRICITABINE-TENOFOVIR (GENVOYA) 150-150-200-10 MG TABS TABLET    Take 1 tablet by mouth daily with breakfast.   HYDRALAZINE (APRESOLINE) 25 MG TABLET    Take 1 tablet (25 mg total) by mouth every 8 (eight) hours. For hypertension   IBUPROFEN (ADVIL) 200 MG TABLET    Take 600 mg by mouth as needed for mild pain (pain score 1-3) or headache.   INSULIN GLARGINE (LANTUS SOLOSTAR) 100 UNIT/ML SOLOSTAR PEN    Inject 18 Units into the skin daily.   INSULIN LISPRO (HUMALOG) 100 UNIT/ML KWIKPEN    Inject 6 Units into the skin with breakfast, with lunch, and with evening meal. For blood sugars 0-150 give 0 units of insulin, 151-200 give 2 units of insulin, 201-250 give 4 units, 251-300 give 6 units, 301-350 give 8 units, 351-400 give 10 units,> 400 give 12 units   MIRTAZAPINE (REMERON) 15 MG TABLET    Take 0.5 tablets (7.5 mg total) by mouth at bedtime.   SERTRALINE (ZOLOFT) 100 MG TABLET    Take 1.5 tablets (150 mg total) by mouth daily.   VALSARTAN-HYDROCHLOROTHIAZIDE (DIOVAN-HCT)  320-25 MG TABLET    Take 1 tablet by mouth daily.  Modified Medications   No medications on file  Discontinued Medications   No medications on file    Labs: Lab Results  Component Value Date   HIV1RNAQUANT Not Detected 03/24/2023   HIV1RNAQUANT Not Detected 08/03/2022   HIV1RNAQUANT Not Detected 02/03/2022   CD4TABS 732 08/03/2022   CD4TABS 520 02/03/2022   CD4TABS 535 10/21/2020   Hepatitis B Lab Results  Component Value Date   HEPBSAB POS (A) 10/21/2014   HEPBSAG NEGATIVE 10/21/2014   HEPBCAB NON REACTIVE 10/21/2014   Hepatitis C No results found for: "HEPCAB", "HCVRNAPCRQN" Hepatitis A Lab Results  Component Value Date   HAV NON-REACTIVE 12/06/2019   Lipids: Lab Results  Component Value Date   CHOL 191 03/24/2023   TRIG 69 03/24/2023   HDL 95 03/24/2023   CHOLHDL 2.0 03/24/2023   VLDL 10 09/24/2020   LDLCALC 81 03/24/2023    Current HIV Regimen: Genvoya  Assessment: [name] is here today to follow up for his HIV infection. He has missed multiple appointments lately and was last seen by [name] on 08/03/22 where he was having issues with his depression but taking his Genvoya every day. His last HIV viral load was not detected and CD4 count was healthy at 573 back on 03/24/23. He was treated for syphilis back in June as well with three weekly Bicillin injections. RPR back in June was 1:32 and was nonreactive in January when rechecked  indicating adequate treatment response.   He comes in today for STI testing. He has one partner but thinks his current partner has had other partners lately. Last sexual encounter with this partner was on 05/19/23 with no condoms. No signs or symptoms of any STIs today; just being cautious. He agrees to check all three sites.   Will schedule him with [name] at his first available per his request.   Plan: - RPR and all three site STI check today - Follow up with [name] on 06/12/23  [name] L. Sativa Gelles, PharmD, BCIDP, AAHIVP, CPP Clinical  Pharmacist Practitioner Infectious Diseases Clinical Pharmacist [facility] 05/29/2023, 9:14 AM

## 2023-05-30 LAB — CT/NG RNA, TMA RECTAL
Chlamydia Trachomatis RNA: NOT DETECTED
Neisseria Gonorrhoeae RNA: NOT DETECTED

## 2023-05-30 LAB — GC/CHLAMYDIA PROBE, AMP (THROAT)
Chlamydia trachomatis RNA: NOT DETECTED
Neisseria gonorrhoeae RNA: NOT DETECTED

## 2023-05-30 LAB — C. TRACHOMATIS/N. GONORRHOEAE RNA
C. trachomatis RNA, TMA: NOT DETECTED
N. gonorrhoeae RNA, TMA: NOT DETECTED

## 2023-05-31 ENCOUNTER — Encounter: Payer: Self-pay | Admitting: Pharmacist

## 2023-05-31 ENCOUNTER — Encounter (HOSPITAL_COMMUNITY): Payer: Self-pay

## 2023-05-31 ENCOUNTER — Emergency Department (HOSPITAL_COMMUNITY): Payer: MEDICAID

## 2023-05-31 ENCOUNTER — Telehealth: Payer: Self-pay | Admitting: Pharmacist

## 2023-05-31 ENCOUNTER — Other Ambulatory Visit: Payer: Self-pay

## 2023-05-31 ENCOUNTER — Emergency Department (HOSPITAL_COMMUNITY)
Admission: EM | Admit: 2023-05-31 | Discharge: 2023-05-31 | Disposition: A | Discharge status: Home / Self Care | Payer: MEDICAID | Source: Home / Self Care | Attending: Emergency Medicine | Admitting: Emergency Medicine

## 2023-05-31 DIAGNOSIS — R1084 Generalized abdominal pain: Secondary | ICD-10-CM | POA: Insufficient documentation

## 2023-05-31 DIAGNOSIS — I129 Hypertensive chronic kidney disease with stage 1 through stage 4 chronic kidney disease, or unspecified chronic kidney disease: Secondary | ICD-10-CM | POA: Insufficient documentation

## 2023-05-31 DIAGNOSIS — N189 Chronic kidney disease, unspecified: Secondary | ICD-10-CM | POA: Insufficient documentation

## 2023-05-31 DIAGNOSIS — Z21 Asymptomatic human immunodeficiency virus [HIV] infection status: Secondary | ICD-10-CM | POA: Insufficient documentation

## 2023-05-31 DIAGNOSIS — R112 Nausea with vomiting, unspecified: Secondary | ICD-10-CM | POA: Insufficient documentation

## 2023-05-31 DIAGNOSIS — Z794 Long term (current) use of insulin: Secondary | ICD-10-CM | POA: Insufficient documentation

## 2023-05-31 DIAGNOSIS — E1122 Type 2 diabetes mellitus with diabetic chronic kidney disease: Secondary | ICD-10-CM | POA: Insufficient documentation

## 2023-05-31 DIAGNOSIS — Z79899 Other long term (current) drug therapy: Secondary | ICD-10-CM | POA: Insufficient documentation

## 2023-05-31 DIAGNOSIS — R197 Diarrhea, unspecified: Secondary | ICD-10-CM | POA: Insufficient documentation

## 2023-05-31 LAB — URINALYSIS, ROUTINE W REFLEX MICROSCOPIC
Bilirubin Urine: NEGATIVE
Glucose, UA: NEGATIVE mg/dL
Hgb urine dipstick: NEGATIVE
Ketones, ur: 20 mg/dL — AB
Leukocytes,Ua: NEGATIVE
Nitrite: NEGATIVE
Protein, ur: >=300 mg/dL — AB
Specific Gravity, Urine: 1.022 (ref 1.005–1.030)
pH: 7 (ref 5.0–8.0)

## 2023-05-31 LAB — COMPREHENSIVE METABOLIC PANEL WITH GFR
ALT: 17 U/L (ref 0–44)
AST: 26 U/L (ref 15–41)
Albumin: 3.8 g/dL (ref 3.5–5.0)
Alkaline Phosphatase: 53 U/L (ref 38–126)
Anion gap: 10 (ref 5–15)
BUN: 19 mg/dL (ref 6–20)
CO2: 26 mmol/L (ref 22–32)
Calcium: 9.8 mg/dL (ref 8.9–10.3)
Chloride: 106 mmol/L (ref 98–111)
Creatinine, Ser: 1.54 mg/dL — ABNORMAL HIGH (ref 0.61–1.24)
GFR, Estimated: 60 mL/min — ABNORMAL LOW (ref >60)
Glucose, Bld: 109 mg/dL — ABNORMAL HIGH (ref 70–99)
Potassium: 5 mmol/L (ref 3.5–5.1)
Sodium: 142 mmol/L (ref 135–145)
Total Bilirubin: 0.6 mg/dL (ref 0.0–1.2)
Total Protein: 7 g/dL (ref 6.5–8.1)

## 2023-05-31 LAB — CBC WITH DIFFERENTIAL/PLATELET
Abs Immature Granulocytes: 0.05 10*3/uL (ref 0.00–0.07)
Basophils Absolute: 0 10*3/uL (ref 0.0–0.1)
Basophils Relative: 0 %
Eosinophils Absolute: 0 10*3/uL (ref 0.0–0.5)
Eosinophils Relative: 0 %
HCT: 41.7 % (ref 39.0–52.0)
Hemoglobin: 14.2 g/dL (ref 13.0–17.0)
Immature Granulocytes: 1 %
Lymphocytes Relative: 8 %
Lymphs Abs: 0.8 10*3/uL (ref 0.7–4.0)
MCH: 30.9 pg (ref 26.0–34.0)
MCHC: 34.1 g/dL (ref 30.0–36.0)
MCV: 90.7 fL (ref 80.0–100.0)
Monocytes Absolute: 0.3 10*3/uL (ref 0.1–1.0)
Monocytes Relative: 3 %
Neutro Abs: 8.5 10*3/uL — ABNORMAL HIGH (ref 1.7–7.7)
Neutrophils Relative %: 88 %
Platelets: 271 10*3/uL (ref 150–400)
RBC: 4.6 MIL/uL (ref 4.22–5.81)
RDW: 14.3 % (ref 11.5–15.5)
WBC: 9.7 10*3/uL (ref 4.0–10.5)
nRBC: 0 % (ref 0.0–0.2)

## 2023-05-31 LAB — LIPASE, BLOOD: Lipase: 23 U/L (ref 11–51)

## 2023-05-31 LAB — RPR: RPR Ser Ql: REACTIVE — AB

## 2023-05-31 LAB — RPR TITER: RPR Titer: 1:16 {titer} — ABNORMAL HIGH

## 2023-05-31 LAB — T PALLIDUM AB: T Pallidum Abs: POSITIVE — AB

## 2023-05-31 MED ORDER — ACETAMINOPHEN 500 MG PO TABS: 1000.0000 mg | ORAL_TABLET | Freq: Three times a day (TID) | ORAL | 0 refills | Status: AC | PRN

## 2023-05-31 MED ORDER — ONDANSETRON HCL 4 MG/2ML IJ SOLN
4.0000 mg | Freq: Once | INTRAMUSCULAR | Status: AC
  Administered 2023-05-31: 4 mg via INTRAVENOUS
  Filled 2023-05-31: qty 2

## 2023-05-31 MED ORDER — METOCLOPRAMIDE HCL 5 MG/ML IJ SOLN
10.0000 mg | Freq: Once | INTRAMUSCULAR | Status: AC
  Administered 2023-05-31: 10 mg via INTRAVENOUS
  Filled 2023-05-31: qty 2

## 2023-05-31 MED ORDER — ONDANSETRON HCL 4 MG PO TABS: 4.0000 mg | ORAL_TABLET | Freq: Three times a day (TID) | ORAL | 0 refills | Status: AC | PRN

## 2023-05-31 MED ORDER — MORPHINE SULFATE (PF) 4 MG/ML IV SOLN
4.0000 mg | Freq: Once | INTRAVENOUS | Status: AC
  Administered 2023-05-31: 4 mg via INTRAVENOUS
  Filled 2023-05-31: qty 1

## 2023-05-31 MED ORDER — SODIUM CHLORIDE 0.9 % IV BOLUS
1000.0000 mL | Freq: Once | INTRAVENOUS | Status: AC
  Administered 2023-05-31: 1000 mL via INTRAVENOUS

## 2023-05-31 MED ORDER — IOHEXOL 350 MG/ML SOLN
75.0000 mL | Freq: Once | INTRAVENOUS | Status: AC | PRN
  Administered 2023-05-31: 75 mL via INTRAVENOUS

## 2023-05-31 NOTE — ED Provider Triage Note (Signed)
 Emergency Medicine Provider Triage Evaluation Note  Olyn Landstrom , a 38 y.o. male  was evaluated in triage.  Pt complains of intractable n/v/d x 24 hours. No hx of same. Pmh of well controlled HIV (quant neg in January).  Review of Systems  Positive: N/v/d Negative: fever  Physical Exam  BP (!) 152/102 (BP Location: Left Arm)   Pulse 77   Temp 98 F (36.7 C) (Oral)   Resp 17   Ht 6' (1.829 m)   SpO2 100%   BMI 25.32 kg/m  Gen:   Awake, no distress   Resp:  Normal effort  MSK:   Moves extremities without difficulty  Other:    Medical Decision Making  Medically screening exam initiated at 2:22 PM.  Appropriate orders placed.  Adrion Danirus Moga was informed that the remainder of the evaluation will be completed by another provider, this initial triage assessment does not replace that evaluation, and the importance of remaining in the ED until their evaluation is complete.     Arthor Captain, PA-C 05/31/23 1423

## 2023-05-31 NOTE — ED Triage Notes (Signed)
 N/V/D/chills/fevers since last night. EMS gave 4 mg of zofran. C/O abd pain.

## 2023-05-31 NOTE — ED Notes (Signed)
 Went to CT

## 2023-05-31 NOTE — Discharge Instructions (Signed)
 You have been evaluated for your symptoms.  Your symptom is likely due to a viral stomach bug causing nausea vomiting diarrhea.  Take Tylenol as needed for fever and bodyaches, take Zofran as needed for nausea.  If you develop rectal pain, difficulty having a bowel movement, worsening symptoms please return.

## 2023-05-31 NOTE — Telephone Encounter (Signed)
 Called patient regarding need for syphilis treatment. No answer, left HIPAA compliant VM. Also sent mychart message.  Aldea Avis L. Uriyah Massimo, PharmD, BCIDP, AAHIVP, CPP Clinical Pharmacist Practitioner - Infectious Diseases Clinical Pharmacist Lead - [facility] 05/31/2023, 11:00 AM

## 2023-05-31 NOTE — ED Notes (Signed)
Pt provided with something to drink.

## 2023-05-31 NOTE — ED Provider Notes (Signed)
 [place] EMERGENCY DEPARTMENT AT [facility] Provider Note   CSN: [id number] Arrival date & time: 05/31/23  1400     History  Chief Complaint  Patient presents with   Emesis    [name] is a 38 y.o. male.  The history is provided by the patient and medical records. No language interpreter was used.  Emesis    38 year old male with history of HIV, diabetes, hypertension, CKD presenting with complaint of abdominal pain.  Patient was brought here via EMS from home.  Patient developed periumbilical abdominal pain along with nausea vomiting diarrhea since yesterday.  He also endorsed subjective fever and chills.  Symptoms moderate in severity he is unable to keep anything down.  He does not endorse any runny nose sneezing or coughing no urinary symptom.  Denies any recent sick contact.  Admits to alcohol and marijuana use on occasion but none recently.  He is unsure his last CD4 count or viral load but states he is compliant with his HIV medication.  Home Medications Prior to Admission medications   Medication Sig Start Date End Date Taking? Authorizing Provider  Camphor-Menthol-Methyl Sal (TIGER BALM MUSCLE RUB EX) Apply 1 Application topically as needed.    [provider]  elvitegravir-cobicistat-emtricitabine-tenofovir (GENVOYA) 150-150-200-10 MG TABS tablet Take 1 tablet by mouth daily with breakfast. 09/14/22   Veryl Speak, FNP  hydrALAZINE (APRESOLINE) 25 MG tablet Take 1 tablet (25 mg total) by mouth every 8 (eight) hours. For hypertension Patient taking differently: Take 50 mg by mouth every 8 (eight) hours. For hypertension 02/01/23   Claiborne Rigg, NP  ibuprofen (ADVIL) 200 MG tablet Take 600 mg by mouth as needed for mild pain (pain score 1-3) or headache.    [provider]  insulin glargine (LANTUS SOLOSTAR) 100 UNIT/ML Solostar Pen Inject 18 Units into the skin daily. 02/01/23   Claiborne Rigg, NP  insulin lispro (HUMALOG)  100 UNIT/ML KwikPen Inject 6 Units into the skin with breakfast, with lunch, and with evening meal. For blood sugars 0-150 give 0 units of insulin, 151-200 give 2 units of insulin, 201-250 give 4 units, 251-300 give 6 units, 301-350 give 8 units, 351-400 give 10 units,> 400 give 12 units Patient taking differently: Inject 6-12 Units into the skin with breakfast, with lunch, and with evening meal. For blood sugars 0-150 give 0 units of insulin, 151-200 give 2 units of insulin, 201-250 give 4 units, 251-300 give 6 units, 301-350 give 8 units, 351-400 give 10 units,> 400 give 12 units 02/01/23   Claiborne Rigg, NP  mirtazapine (REMERON) 15 MG tablet Take 0.5 tablets (7.5 mg total) by mouth at bedtime. Patient taking differently: Take 7.5 mg by mouth 3 (three) times a week. 10/31/22 04/06/23  Hughie Closs, MD  sertraline (ZOLOFT) 100 MG tablet Take 1.5 tablets (150 mg total) by mouth daily. 02/01/23   Claiborne Rigg, NP  valsartan-hydrochlorothiazide (DIOVAN-HCT) 320-25 MG tablet Take 1 tablet by mouth daily. 02/01/23   Claiborne Rigg, NP  pravastatin (PRAVACHOL) 20 MG tablet Take 1 tablet (20 mg total) by mouth daily. Patient not taking: Reported on 03/31/2019 03/08/19 07/08/19  Claiborne Rigg, NP      Allergies    Shellfish allergy and Aspirin    Review of Systems   Review of Systems  Gastrointestinal:  Positive for vomiting.  All other systems reviewed and are negative.   Physical Exam Updated Vital Signs BP (!) 152/102 (BP Location: Left Arm)  Pulse 77   Temp 98 F (36.7 C) (Oral)   Resp 17   Ht 6' (1.829 m)   SpO2 100%   BMI 25.32 kg/m  Physical Exam Constitutional:      General: He is not in acute distress.    Appearance: He is well-developed.  HENT:     Head: Atraumatic.  Eyes:     Conjunctiva/sclera: Conjunctivae normal.  Cardiovascular:     Rate and Rhythm: Normal rate and regular rhythm.     Pulses: Normal pulses.     Heart sounds: Normal heart sounds.  Pulmonary:      Effort: Pulmonary effort is normal.     Breath sounds: Normal breath sounds.  Abdominal:     Palpations: Abdomen is soft.     Tenderness: There is abdominal tenderness (Diffuse tenderness no guarding no rebound tenderness).  Musculoskeletal:     Cervical back: Normal range of motion and neck supple.  Skin:    Findings: No rash.  Neurological:     Mental Status: He is alert.     ED Results / Procedures / Treatments   Labs (all labs ordered are listed, but only abnormal results are displayed) Labs Reviewed  CBC WITH DIFFERENTIAL/PLATELET - Abnormal; Notable for the following components:      Result Value   Neutro Abs 8.5 (*)    All other components within normal limits  COMPREHENSIVE METABOLIC PANEL WITH GFR - Abnormal; Notable for the following components:   Glucose, Bld 109 (*)    Creatinine, Ser 1.54 (*)    GFR, Estimated 60 (*)    All other components within normal limits  URINALYSIS, ROUTINE W REFLEX MICROSCOPIC - Abnormal; Notable for the following components:   Ketones, ur 20 (*)    Protein, ur >=300 (*)    Bacteria, UA RARE (*)    All other components within normal limits  LIPASE, BLOOD    EKG None  Date: 05/31/2023  Rate: 100  Rhythm: normal sinus rhythm  QRS Axis: normal  Intervals: normal  ST/T Wave abnormalities: normal  Conduction Disutrbances: none  Narrative Interpretation:   Old EKG Reviewed: No significant changes noted    Radiology CT ABDOMEN PELVIS W CONTRAST Result Date: 05/31/2023 CLINICAL DATA:  Abdominal pain, acute, nonlocalized EXAM: CT ABDOMEN AND PELVIS WITH CONTRAST TECHNIQUE: Multidetector CT imaging of the abdomen and pelvis was performed using the standard protocol following bolus administration of intravenous contrast. RADIATION DOSE REDUCTION: This exam was performed according to the departmental dose-optimization program which includes automated exposure control, adjustment of the mA and/or kV according to patient size and/or use of  iterative reconstruction technique. CONTRAST:  75mL OMNIPAQUE IOHEXOL 350 MG/ML SOLN COMPARISON:  CT abdomen pelvis 06/26/2022 FINDINGS: Lower chest: No acute abnormality.  Prominent heart size. Hepatobiliary: No focal liver abnormality. No gallstones, gallbladder wall thickening, or pericholecystic fluid. No biliary dilatation. Pancreas: No focal lesion. Normal pancreatic contour. No surrounding inflammatory changes. No main pancreatic ductal dilatation. Spleen: Normal in size without focal abnormality. Adrenals/Urinary Tract: No adrenal nodule bilaterally. Bilateral kidneys enhance symmetrically. No hydronephrosis. No hydroureter. The urinary bladder is unremarkable. Stomach/Bowel: Stomach is within normal limits. No evidence of bowel wall thickening or dilatation. Appendix appears normal. Vascular/Lymphatic: No abdominal aorta or iliac aneurysm. No abdominal, pelvic, or inguinal lymphadenopathy. Reproductive: Question enlarged difficult to measure heterogeneous prostate. Other: No intraperitoneal free fluid. No intraperitoneal free gas. No organized fluid collection. Musculoskeletal: Similar-appearing small fat containing umbilical hernia. No suspicious lytic or blastic osseous lesions. No  acute displaced fracture. Multilevel degenerative changes of the spine. Marked Schmorl node formation along the L3-L4 levels. IMPRESSION: 1. Prominent heart size. 2. Question enlarged difficult to measure heterogeneous prostate. Consider correlation with physical exam. 3. Small fat containing umbilical hernia. Electronically Signed   By: Tish Frederickson M.D.   On: 05/31/2023 20:03    Procedures Procedures    Medications Ordered in ED Medications  metoCLOPramide (REGLAN) injection 10 mg (10 mg Intravenous Given 05/31/23 1425)  sodium chloride 0.9 % bolus 1,000 mL (0 mLs Intravenous Stopped 05/31/23 2103)  morphine (PF) 4 MG/ML injection 4 mg (4 mg Intravenous Given 05/31/23 1955)  ondansetron (ZOFRAN) injection 4 mg (4 mg  Intravenous Given 05/31/23 1955)  iohexol (OMNIPAQUE) 350 MG/ML injection 75 mL (75 mLs Intravenous Contrast Given 05/31/23 1904)    ED Course/ Medical Decision Making/ A&P                                 Medical Decision Making Amount and/or Complexity of Data Reviewed Radiology: ordered. ECG/medicine tests: ordered.  Risk Prescription drug management.   BP (!) 152/102 (BP Location: Left Arm)   Pulse 77   Temp 98 F (36.7 C) (Oral)   Resp 17   Ht 6' (1.829 m)   SpO2 100%   BMI 25.32 kg/m   55:29 PM  38 year old male with history of HIV, diabetes, hypertension, CKD presenting with complaint of abdominal pain.  Patient was brought here via EMS from home.  Patient developed paramedical abdominal pain along with nausea vomiting diarrhea since yesterday.  He also endorsed subjective fever and chills.  Symptoms moderate in severity he is unable to keep anything down.  He does not endorse any runny nose sneezing or coughing no urinary symptom.  Denies any recent sick contact.  Admits to alcohol and marijuana use on occasion but none recently.  He is unsure his last CD4 count or viral load but states he is compliant with his HIV medication.  On exam patient has had generalized abdominal tenderness without focal point tenderness.  He does have some dry heaving.  He appears uncomfortable.  Heart with normal rate rhythm, lungs are clear.  -Labs ordered, independently viewed and interpreted by me.  Labs remarkable for Cr 1.54, IVF given.   -The patient was maintained on a cardiac monitor.  I personally viewed and interpreted the cardiac monitored which showed an underlying rhythm of: NSR -Imaging independently viewed and interpreted by me and I agree with radiologist's interpretation.  Result remarkable for abd/pelvis CT without acute finding.  Question enlarged prostate, however pt sxs consistent with prostate etiology.   -This patient presents to the ED for concern of abd pain, this involves an  extensive number of treatment options, and is a complaint that carries with it a high risk of complications and morbidity.  The differential diagnosis includes viral gastroenteritis, CHS, appendicitis, colitis, diverticulitis, uti, kidney infection,  -Co morbidities that complicate the patient evaluation includes HIV, HTN, DM, CKD -Treatment includes morphine, IVF, zofran -Reevaluation of the patient after these medicines showed that the patient improved -PCP office notes or outside notes reviewed -Escalation to admission/observation considered: patients feels better, is comfortable with discharge, and will follow up with PCP -Prescription medication considered, patient comfortable with zofran, tylenol -Social Determinant of Health considered which includes food insecurity, financial hardship, depression         Final Clinical Impression(s) / ED Diagnoses Final diagnoses:  Nausea vomiting and diarrhea    Rx / DC Orders ED Discharge Orders          Ordered    ondansetron (ZOFRAN) 4 MG tablet  Every 8 hours PRN        05/31/23 2309    acetaminophen (TYLENOL) 500 MG tablet  Every 8 hours PRN        05/31/23 2309              Fayrene Helper, PA-C 05/31/23 4098    Pricilla Loveless, MD 06/02/23 [phone]

## 2023-06-01 ENCOUNTER — Other Ambulatory Visit: Payer: Self-pay

## 2023-06-01 ENCOUNTER — Emergency Department (HOSPITAL_COMMUNITY): Payer: MEDICAID

## 2023-06-01 ENCOUNTER — Encounter (HOSPITAL_COMMUNITY): Payer: Self-pay | Admitting: Internal Medicine

## 2023-06-01 ENCOUNTER — Inpatient Hospital Stay (HOSPITAL_COMMUNITY)
Admission: EM | Admit: 2023-06-01 | Discharge: 2023-06-03 | DRG: 638 | Disposition: A | Discharge status: Home / Self Care | Payer: MEDICAID | Attending: Internal Medicine | Admitting: Internal Medicine

## 2023-06-01 DIAGNOSIS — N1831 Chronic kidney disease, stage 3a: Secondary | ICD-10-CM | POA: Diagnosis present

## 2023-06-01 DIAGNOSIS — Z21 Asymptomatic human immunodeficiency virus [HIV] infection status: Secondary | ICD-10-CM | POA: Diagnosis present

## 2023-06-01 DIAGNOSIS — Z886 Allergy status to analgesic agent status: Secondary | ICD-10-CM

## 2023-06-01 DIAGNOSIS — E1022 Type 1 diabetes mellitus with diabetic chronic kidney disease: Secondary | ICD-10-CM | POA: Diagnosis present

## 2023-06-01 DIAGNOSIS — Z794 Long term (current) use of insulin: Secondary | ICD-10-CM

## 2023-06-01 DIAGNOSIS — N182 Chronic kidney disease, stage 2 (mild): Secondary | ICD-10-CM | POA: Diagnosis present

## 2023-06-01 DIAGNOSIS — N179 Acute kidney failure, unspecified: Secondary | ICD-10-CM | POA: Diagnosis present

## 2023-06-01 DIAGNOSIS — Z803 Family history of malignant neoplasm of breast: Secondary | ICD-10-CM | POA: Diagnosis not present

## 2023-06-01 DIAGNOSIS — J45909 Unspecified asthma, uncomplicated: Secondary | ICD-10-CM | POA: Diagnosis present

## 2023-06-01 DIAGNOSIS — Z8349 Family history of other endocrine, nutritional and metabolic diseases: Secondary | ICD-10-CM | POA: Diagnosis not present

## 2023-06-01 DIAGNOSIS — I10 Essential (primary) hypertension: Secondary | ICD-10-CM | POA: Diagnosis present

## 2023-06-01 DIAGNOSIS — E1010 Type 1 diabetes mellitus with ketoacidosis without coma: Secondary | ICD-10-CM | POA: Diagnosis present

## 2023-06-01 DIAGNOSIS — Z833 Family history of diabetes mellitus: Secondary | ICD-10-CM

## 2023-06-01 DIAGNOSIS — E860 Dehydration: Secondary | ICD-10-CM | POA: Diagnosis present

## 2023-06-01 DIAGNOSIS — Z87891 Personal history of nicotine dependence: Secondary | ICD-10-CM

## 2023-06-01 DIAGNOSIS — N1411 Contrast-induced nephropathy: Secondary | ICD-10-CM | POA: Diagnosis present

## 2023-06-01 DIAGNOSIS — E119 Type 2 diabetes mellitus without complications: Secondary | ICD-10-CM

## 2023-06-01 DIAGNOSIS — Z1152 Encounter for screening for COVID-19: Secondary | ICD-10-CM | POA: Diagnosis not present

## 2023-06-01 DIAGNOSIS — I129 Hypertensive chronic kidney disease with stage 1 through stage 4 chronic kidney disease, or unspecified chronic kidney disease: Secondary | ICD-10-CM | POA: Diagnosis present

## 2023-06-01 DIAGNOSIS — E1110 Type 2 diabetes mellitus with ketoacidosis without coma: Secondary | ICD-10-CM | POA: Diagnosis present

## 2023-06-01 DIAGNOSIS — E785 Hyperlipidemia, unspecified: Secondary | ICD-10-CM | POA: Diagnosis present

## 2023-06-01 DIAGNOSIS — R112 Nausea with vomiting, unspecified: Secondary | ICD-10-CM

## 2023-06-01 DIAGNOSIS — Z91013 Allergy to seafood: Secondary | ICD-10-CM | POA: Diagnosis not present

## 2023-06-01 DIAGNOSIS — Z79899 Other long term (current) drug therapy: Secondary | ICD-10-CM | POA: Diagnosis not present

## 2023-06-01 DIAGNOSIS — F332 Major depressive disorder, recurrent severe without psychotic features: Secondary | ICD-10-CM | POA: Diagnosis present

## 2023-06-01 LAB — CBG MONITORING, ED
Glucose-Capillary: 384 mg/dL — ABNORMAL HIGH (ref 70–99)
Glucose-Capillary: 437 mg/dL — ABNORMAL HIGH (ref 70–99)
Glucose-Capillary: 366 mg/dL — ABNORMAL HIGH (ref 70–99)
Glucose-Capillary: 422 mg/dL — ABNORMAL HIGH (ref 70–99)
Glucose-Capillary: 304 mg/dL — ABNORMAL HIGH (ref 70–99)
Glucose-Capillary: 242 mg/dL — ABNORMAL HIGH (ref 70–99)
Glucose-Capillary: 134 mg/dL — ABNORMAL HIGH (ref 70–99)
Glucose-Capillary: 118 mg/dL — ABNORMAL HIGH (ref 70–99)

## 2023-06-01 LAB — BASIC METABOLIC PANEL WITH GFR
Anion gap: 17 — ABNORMAL HIGH (ref 5–15)
Anion gap: 12 (ref 5–15)
Anion gap: 8 (ref 5–15)
BUN: 36 mg/dL — ABNORMAL HIGH (ref 6–20)
BUN: 41 mg/dL — ABNORMAL HIGH (ref 6–20)
BUN: 42 mg/dL — ABNORMAL HIGH (ref 6–20)
CO2: 18 mmol/L — ABNORMAL LOW (ref 22–32)
CO2: 20 mmol/L — ABNORMAL LOW (ref 22–32)
CO2: 22 mmol/L (ref 22–32)
Calcium: 9.3 mg/dL (ref 8.9–10.3)
Calcium: 8.6 mg/dL — ABNORMAL LOW (ref 8.9–10.3)
Calcium: 8.6 mg/dL — ABNORMAL LOW (ref 8.9–10.3)
Chloride: 99 mmol/L (ref 98–111)
Chloride: 106 mmol/L (ref 98–111)
Chloride: 104 mmol/L (ref 98–111)
Creatinine, Ser: 2.47 mg/dL — ABNORMAL HIGH (ref 0.61–1.24)
Creatinine, Ser: 2.61 mg/dL — ABNORMAL HIGH (ref 0.61–1.24)
Creatinine, Ser: 2.49 mg/dL — ABNORMAL HIGH (ref 0.61–1.24)
GFR, Estimated: 34 mL/min — ABNORMAL LOW (ref >60)
GFR, Estimated: 32 mL/min — ABNORMAL LOW (ref >60)
GFR, Estimated: 33 mL/min — ABNORMAL LOW (ref >60)
Glucose, Bld: 439 mg/dL — ABNORMAL HIGH (ref 70–99)
Glucose, Bld: 124 mg/dL — ABNORMAL HIGH (ref 70–99)
Glucose, Bld: 230 mg/dL — ABNORMAL HIGH (ref 70–99)
Potassium: 5.2 mmol/L — ABNORMAL HIGH (ref 3.5–5.1)
Potassium: 4.2 mmol/L (ref 3.5–5.1)
Potassium: 4.3 mmol/L (ref 3.5–5.1)
Sodium: 134 mmol/L — ABNORMAL LOW (ref 135–145)
Sodium: 138 mmol/L (ref 135–145)
Sodium: 134 mmol/L — ABNORMAL LOW (ref 135–145)

## 2023-06-01 LAB — RESP PANEL BY RT-PCR (RSV, FLU A&B, COVID)  RVPGX2
Influenza A by PCR: NEGATIVE
Influenza B by PCR: NEGATIVE
Resp Syncytial Virus by PCR: NEGATIVE
SARS Coronavirus 2 by RT PCR: NEGATIVE

## 2023-06-01 LAB — CBC
HCT: 42.3 % (ref 39.0–52.0)
HCT: 38.6 % — ABNORMAL LOW (ref 39.0–52.0)
Hemoglobin: 14.2 g/dL (ref 13.0–17.0)
Hemoglobin: 13.1 g/dL (ref 13.0–17.0)
MCH: 30.7 pg (ref 26.0–34.0)
MCH: 31 pg (ref 26.0–34.0)
MCHC: 33.6 g/dL (ref 30.0–36.0)
MCHC: 33.9 g/dL (ref 30.0–36.0)
MCV: 91.4 fL (ref 80.0–100.0)
MCV: 91.3 fL (ref 80.0–100.0)
Platelets: 264 10*3/uL (ref 150–400)
Platelets: 259 10*3/uL (ref 150–400)
RBC: 4.63 MIL/uL (ref 4.22–5.81)
RBC: 4.23 MIL/uL (ref 4.22–5.81)
RDW: 14.9 % (ref 11.5–15.5)
RDW: 14.8 % (ref 11.5–15.5)
WBC: 12.1 10*3/uL — ABNORMAL HIGH (ref 4.0–10.5)
WBC: 13.2 10*3/uL — ABNORMAL HIGH (ref 4.0–10.5)
nRBC: 0 % (ref 0.0–0.2)
nRBC: 0 % (ref 0.0–0.2)

## 2023-06-01 LAB — GLUCOSE, CAPILLARY
Glucose-Capillary: 142 mg/dL — ABNORMAL HIGH (ref 70–99)
Glucose-Capillary: 150 mg/dL — ABNORMAL HIGH (ref 70–99)
Glucose-Capillary: 260 mg/dL — ABNORMAL HIGH (ref 70–99)

## 2023-06-01 LAB — I-STAT VENOUS BLOOD GAS, ED
Acid-base deficit: 10 mmol/L — ABNORMAL HIGH (ref 0.0–2.0)
Bicarbonate: 14.2 mmol/L — ABNORMAL LOW (ref 20.0–28.0)
Calcium, Ion: 1.1 mmol/L — ABNORMAL LOW (ref 1.15–1.40)
HCT: 43 % (ref 39.0–52.0)
Hemoglobin: 14.6 g/dL (ref 13.0–17.0)
O2 Saturation: 99 %
Potassium: 5.4 mmol/L — ABNORMAL HIGH (ref 3.5–5.1)
Sodium: 132 mmol/L — ABNORMAL LOW (ref 135–145)
TCO2: 15 mmol/L — ABNORMAL LOW (ref 22–32)
pCO2, Ven: 27.8 mmHg — ABNORMAL LOW (ref 44–60)
pH, Ven: 7.316 (ref 7.25–7.43)
pO2, Ven: 134 mmHg — ABNORMAL HIGH (ref 32–45)

## 2023-06-01 LAB — BETA-HYDROXYBUTYRIC ACID
Beta-Hydroxybutyric Acid: 7.08 mmol/L — ABNORMAL HIGH (ref 0.05–0.27)
Beta-Hydroxybutyric Acid: 0.27 mmol/L (ref 0.05–0.27)

## 2023-06-01 LAB — HEMOGLOBIN A1C
Hgb A1c MFr Bld: 9.4 % — ABNORMAL HIGH (ref 4.8–5.6)
Mean Plasma Glucose: 223.08 mg/dL

## 2023-06-01 LAB — PROCALCITONIN: Procalcitonin: 0.19 ng/mL

## 2023-06-01 MED ORDER — MORPHINE SULFATE (PF) 2 MG/ML IV SOLN: 1.0000 mg | INTRAVENOUS | Status: DC | PRN

## 2023-06-01 MED ORDER — SODIUM CHLORIDE 0.9 % IV SOLN: INTRAVENOUS | Status: AC

## 2023-06-01 MED ORDER — ACETAMINOPHEN 500 MG PO TABS
1000.0000 mg | ORAL_TABLET | Freq: Once | ORAL | Status: AC
  Administered 2023-06-01: 1000 mg via ORAL
  Filled 2023-06-01: qty 2

## 2023-06-01 MED ORDER — PENICILLIN G BENZATHINE 1200000 UNIT/2ML IM SUSY
2.4000 10*6.[IU] | PREFILLED_SYRINGE | Freq: Once | INTRAMUSCULAR | Status: AC
  Administered 2023-06-01: 2.4 10*6.[IU] via INTRAMUSCULAR
  Filled 2023-06-01: qty 4

## 2023-06-01 MED ORDER — INSULIN REGULAR(HUMAN) IN NACL 100-0.9 UT/100ML-% IV SOLN
INTRAVENOUS | Status: DC
  Administered 2023-06-01: 9 [IU]/h via INTRAVENOUS
  Filled 2023-06-01: qty 100

## 2023-06-01 MED ORDER — LACTATED RINGERS IV BOLUS: 20.0000 mL/kg | Freq: Once | INTRAVENOUS | Status: DC

## 2023-06-01 MED ORDER — HEPARIN SODIUM (PORCINE) 5000 UNIT/ML IJ SOLN
5000.0000 [IU] | Freq: Three times a day (TID) | INTRAMUSCULAR | Status: DC
  Administered 2023-06-01 – 2023-06-02 (×5): 5000 [IU] via SUBCUTANEOUS
  Filled 2023-06-01 (×6): qty 1

## 2023-06-01 MED ORDER — SODIUM CHLORIDE 0.9 % IV BOLUS
1000.0000 mL | Freq: Once | INTRAVENOUS | Status: AC
  Administered 2023-06-01: 1000 mL via INTRAVENOUS

## 2023-06-01 MED ORDER — DEXTROSE IN LACTATED RINGERS 5 % IV SOLN: INTRAVENOUS | Status: DC

## 2023-06-01 MED ORDER — SODIUM CHLORIDE 0.9 % IV BOLUS
1000.0000 mL | INTRAVENOUS | Status: AC
  Administered 2023-06-01: 1000 mL via INTRAVENOUS

## 2023-06-01 MED ORDER — INSULIN ASPART 100 UNIT/ML IJ SOLN
0.0000 [IU] | Freq: Three times a day (TID) | INTRAMUSCULAR | Status: DC
  Administered 2023-06-01: 2 [IU] via SUBCUTANEOUS
  Administered 2023-06-02: 8 [IU] via SUBCUTANEOUS
  Administered 2023-06-02: 3 [IU] via SUBCUTANEOUS

## 2023-06-01 MED ORDER — DEXTROSE 50 % IV SOLN: 0.0000 mL | INTRAVENOUS | Status: DC | PRN

## 2023-06-01 MED ORDER — INSULIN GLARGINE-YFGN 100 UNIT/ML ~~LOC~~ SOLN
18.0000 [IU] | SUBCUTANEOUS | Status: DC
  Administered 2023-06-01 – 2023-06-02 (×2): 18 [IU] via SUBCUTANEOUS
  Filled 2023-06-01 (×3): qty 0.18

## 2023-06-01 MED ORDER — ELVITEG-COBIC-EMTRICIT-TENOFAF 150-150-200-10 MG PO TABS
1.0000 | ORAL_TABLET | Freq: Every day | ORAL | Status: DC
Start: 2023-06-02 — End: 2023-06-03
  Administered 2023-06-02 – 2023-06-03 (×2): 1 via ORAL
  Filled 2023-06-01 (×2): qty 1

## 2023-06-01 MED ORDER — SERTRALINE HCL 50 MG PO TABS
150.0000 mg | ORAL_TABLET | Freq: Every day | ORAL | Status: DC
  Administered 2023-06-01 – 2023-06-03 (×3): 150 mg via ORAL
  Filled 2023-06-01 (×3): qty 1

## 2023-06-01 MED ORDER — LOPERAMIDE HCL 2 MG PO CAPS
4.0000 mg | ORAL_CAPSULE | Freq: Once | ORAL | Status: AC
  Administered 2023-06-01: 4 mg via ORAL
  Filled 2023-06-01: qty 2

## 2023-06-01 MED ORDER — INSULIN REGULAR(HUMAN) IN NACL 100-0.9 UT/100ML-% IV SOLN
INTRAVENOUS | Status: DC
Start: 2023-06-01 — End: 2023-06-01

## 2023-06-01 MED ORDER — LACTATED RINGERS IV SOLN: INTRAVENOUS | Status: DC

## 2023-06-01 MED ORDER — INSULIN ASPART 100 UNIT/ML IJ SOLN
0.0000 [IU] | Freq: Every day | INTRAMUSCULAR | Status: DC
  Administered 2023-06-01: 3 [IU] via SUBCUTANEOUS

## 2023-06-01 MED ORDER — SODIUM CHLORIDE 0.9 % IV SOLN
12.5000 mg | Freq: Once | INTRAVENOUS | Status: AC
  Administered 2023-06-01: 12.5 mg via INTRAVENOUS
  Filled 2023-06-01: qty 12.5

## 2023-06-01 MED ORDER — ONDANSETRON HCL 4 MG/2ML IJ SOLN
4.0000 mg | Freq: Once | INTRAMUSCULAR | Status: AC
  Administered 2023-06-01: 4 mg via INTRAVENOUS
  Filled 2023-06-01: qty 2

## 2023-06-01 NOTE — H&P (Signed)
 [facility] H&P   Patient Demographics:    [name], is a 38 y.o. male  MRN: [id number]   DOB - [birth year]  Admit Date - 06/01/2023  Outpatient Primary MD for the patient is Storm Frisk, MD  Referring MD/NP/PA: Dr. Sherral Hammers  Patient coming from: Home  Chief Complaint  Patient presents with   Emesis    Hyperglycemia       HPI:    [name]  is a 38 y.o. male, with medical history significant of asthma, anxiety/depression, HIV, type 1 diabetes, CKD stage IIIa, hypertension, hyperlipidemia, marijuana abuse.  With previous admission in the past due to uncontrolled diabetes with either hypoglycemia or hyperglycemia.   -Presents to ED secondary to complaints of nausea, vomiting and hyperglycemia, patient was in ED yesterday for complaints of nausea, vomiting and abdominal pain, CT abdomen pelvis with no acute finding, UA was negative, while he was discharged home, he presents again today with significant nausea and vomiting and hyperglycemia, denies any chest pain, shortness of breath, urinary symptoms.. -In ED he was found to be in DKA, with temperature of 100, white blood cell count of 12, potassium of 5.4, creatinine up to 2.4, with blood glucose of 439 with anion gap of 17, started on insulin drip, and [facility] hospital[facility] consulted to admit  Review of systems:      A full 10 point Review of Systems was done, except as stated above, all other Review of Systems were negative.   With Past History of the following :    Past Medical History:  Diagnosis Date   Anxiety    Asthma    Closed displaced comminuted fracture of shaft of right tibia 11/01/2017   Closed fracture of base of fifth metatarsal bone of right foot 11/13/2017   Closed fracture of medial malleolus of right ankle 10/31/2017   Depression    Headache    "only w/stress" (11/01/2017)   HIV (human immunodeficiency  virus infection) (HCC)    Hypoglycemia due to insulin 06/27/2022   Migraine    "a few/month" (11/01/2017)   Refusal of blood transfusions as patient is Jehovah's Witness    Seizure (HCC)    "only w/my low blood sugars"  (11/01/2017)   Type I diabetes mellitus (HCC)       Past Surgical History:  Procedure Laterality Date   FRACTURE SURGERY     IM NAILING TIBIA Right 11/01/2017   INTRAMEDULLARY (IM) NAIL TIBIALRightGeneral   ORIF ANKLE FRACTURE Right 11/01/2017   ORIF ANKLE FRACTURE Right 11/01/2017   Procedure: OPEN REDUCTION INTERNAL FIXATION (ORIF) ANKLE FRACTURE;  Surgeon: Roby Lofts, MD;  Location: [facility] OR;  Service: Orthopedics;  Laterality: Right;   TIBIA IM NAIL INSERTION Right 11/01/2017   Procedure: INTRAMEDULLARY (IM) NAIL TIBIAL;  Surgeon: Roby Lofts, MD;  Location: [facility] OR;  Service: Orthopedics;  Laterality: Right;  Social History:     Social History   Tobacco Use   Smoking status: Former    Types: Cigars    Start date: 09/29/2015    Quit date: 12/16/2022    Years since quitting: 0.4   Smokeless tobacco: Never   Tobacco comments:    11/01/2017  "2 cigarettes in ~ 6 month "  Substance Use Topics   Alcohol use: Not Currently    Alcohol/week: 7.0 standard drinks of alcohol    Types: 7 Cans of beer per week       Family History :     Family History  Problem Relation Age of Onset   Thyroid disease Mother    Diabetes Mother    Breast cancer Maternal Grandmother    Diabetes Maternal Grandmother       Home Medications:   Prior to Admission medications   Medication Sig Start Date End Date Taking? Authorizing Provider  acetaminophen (TYLENOL) 500 MG tablet Take 2 tablets (1,000 mg total) by mouth every 8 (eight) hours as needed for moderate pain (pain score 4-6), fever, mild pain (pain score 1-3) or headache. 05/31/23   Fayrene Helper, PA-C  Camphor-Menthol-Methyl Sal (TIGER BALM MUSCLE RUB EX) Apply 1 Application topically as needed.    [provider]  elvitegravir-cobicistat-emtricitabine-tenofovir (GENVOYA) 150-150-200-10 MG TABS tablet Take 1 tablet by mouth daily with breakfast. 09/14/22   Veryl Speak, FNP  hydrALAZINE (APRESOLINE) 25 MG tablet Take 1 tablet (25 mg total) by mouth every 8 (eight) hours. For hypertension Patient taking differently: Take 50 mg by mouth every 8 (eight) hours. For hypertension 02/01/23   Claiborne Rigg, NP  ibuprofen (ADVIL) 200 MG tablet Take 600 mg by mouth as needed for mild pain (pain score 1-3) or headache.    [provider]  insulin glargine (LANTUS SOLOSTAR) 100 UNIT/ML Solostar Pen Inject 18 Units into the skin daily. 02/01/23   Claiborne Rigg, NP  insulin lispro (HUMALOG) 100 UNIT/ML KwikPen Inject 6 Units into the skin with breakfast, with lunch, and with evening meal. For blood sugars 0-150 give 0 units of insulin, 151-200 give 2 units of insulin, 201-250 give 4 units, 251-300 give 6 units, 301-350 give 8 units, 351-400 give 10 units,> 400 give 12 units Patient taking differently: Inject 6-12 Units into the skin with breakfast, with lunch, and with evening meal. For blood sugars 0-150 give 0 units of insulin, 151-200 give 2 units of insulin, 201-250 give 4 units, 251-300 give 6 units, 301-350 give 8 units, 351-400 give 10 units,> 400 give 12 units 02/01/23   Claiborne Rigg, NP  mirtazapine (REMERON) 15 MG tablet Take 0.5 tablets (7.5 mg total) by mouth at bedtime. Patient taking differently: Take 7.5 mg by mouth 3 (three) times a week. 10/31/22 04/06/23  Hughie Closs, MD  ondansetron (ZOFRAN) 4 MG tablet Take 1 tablet (4 mg total) by mouth every 8 (eight) hours as needed for nausea or vomiting. 05/31/23   Fayrene Helper, PA-C  sertraline (ZOLOFT) 100 MG tablet Take 1.5 tablets (150 mg total) by mouth daily. 02/01/23   Claiborne Rigg, NP  valsartan-hydrochlorothiazide (DIOVAN-HCT) 320-25 MG tablet Take 1 tablet by mouth daily. 02/01/23   Claiborne Rigg, NP  pravastatin (PRAVACHOL) 20 MG  tablet Take 1 tablet (20 mg total) by mouth daily. Patient not taking: Reported on 03/31/2019 03/08/19 07/08/19  Claiborne Rigg, NP     Allergies:     Allergies  Allergen Reactions   Shellfish  Allergy Anaphylaxis and Hives   Aspirin Other (See Comments)    Lump in armpit     Physical Exam:   Vitals  Blood pressure (!) 155/85, pulse (!) 108, temperature 100 F (37.8 C), resp. rate 18, SpO2 100%.   1. General Developed male, laying in bed, in mild discomfort due to nausea  2. Normal affect and insight, Not Suicidal or Homicidal, Awake Alert, Oriented X 3.  3. No F.N deficits, ALL C.Nerves Intact, Strength 5/5 all 4 extremities, Sensation intact all 4 extremities, Plantars down going.  4. Ears and Eyes appear Normal, Conjunctivae clear, PERRLA.  Dry t Oral Mucosa.  5. Supple Neck, No JVD, No cervical lymphadenopathy appriciated, No Carotid Bruits.  6. Symmetrical Chest wall movement, Good air movement bilaterally, CTAB.  7. RRR, No Gallops, Rubs or Murmurs, No Parasternal Heave.  8. Positive Bowel Sounds, Abdomen Soft, mild epigastric tenderness, No organomegaly appriciated,No rebound -guarding or rigidity.  9.  No Cyanosis, Normal Skin Turgor, No Skin Rash or Bruise.  10. Good muscle tone,  joints appear normal , no effusions, Normal ROM.     Data Review:    CBC Recent Labs  Lab 05/31/23 1427 06/01/23 0527 06/01/23 0715  WBC 9.7 12.1*  --   HGB 14.2 14.2 14.6  HCT 41.7 42.3 43.0  PLT 271 264  --   MCV 90.7 91.4  --   MCH 30.9 30.7  --   MCHC 34.1 33.6  --   RDW 14.3 14.9  --   LYMPHSABS 0.8  --   --   MONOABS 0.3  --   --   EOSABS 0.0  --   --   BASOSABS 0.0  --   --    ------------------------------------------------------------------------------------------------------------------  Chemistries  Recent Labs  Lab 05/31/23 1427 06/01/23 0527 06/01/23 0715  NA 142 134* 132*  K 5.0 5.2* 5.4*  CL 106 99  --   CO2 26 18*  --   GLUCOSE 109* 439*  --    BUN 19 36*  --   CREATININE 1.54* 2.47*  --   CALCIUM 9.8 9.3  --   AST 26  --   --   ALT 17  --   --   ALKPHOS 53  --   --   BILITOT 0.6  --   --    ------------------------------------------------------------------------------------------------------------------ CrCl cannot be calculated (Unknown ideal weight.). ------------------------------------------------------------------------------------------------------------------ No results for input(s): "TSH", "T4TOTAL", "T3FREE", "THYROIDAB" in the last 72 hours.  Invalid input(s): "FREET3"  Coagulation profile No results for input(s): "INR", "PROTIME" in the last 168 hours. ------------------------------------------------------------------------------------------------------------------- No results for input(s): "DDIMER" in the last 72 hours. -------------------------------------------------------------------------------------------------------------------  Cardiac Enzymes No results for input(s): "CKMB", "TROPONINI", "MYOGLOBIN" in the last 168 hours.  Invalid input(s): "CK" ------------------------------------------------------------------------------------------------------------------ No results found for: "BNP"   ---------------------------------------------------------------------------------------------------------------  Urinalysis    Component Value Date/Time   COLORURINE YELLOW 05/31/2023 1417   APPEARANCEUR CLEAR 05/31/2023 1417   LABSPEC 1.022 05/31/2023 1417   PHURINE 7.0 05/31/2023 1417   GLUCOSEU NEGATIVE 05/31/2023 1417   HGBUR NEGATIVE 05/31/2023 1417   BILIRUBINUR NEGATIVE 05/31/2023 1417   KETONESUR 20 (A) 05/31/2023 1417   PROTEINUR >=300 (A) 05/31/2023 1417   UROBILINOGEN 0.2 02/10/2015 1416   NITRITE NEGATIVE 05/31/2023 1417   LEUKOCYTESUR NEGATIVE 05/31/2023 1417    ----------------------------------------------------------------------------------------------------------------   Imaging  Results:    DG Chest Port 1 View Result Date: 06/01/2023 CLINICAL DATA:  Chest pain, vomiting and diarrhea. EXAM: PORTABLE CHEST 1 VIEW COMPARISON:  PA and lateral chest 11/29/2022 FINDINGS: The heart size and mediastinal contours are within normal limits. Both lungs are clear. The visualized skeletal structures are unremarkable. IMPRESSION: No active disease.  Unchanged. Electronically Signed   By: Almira Bar M.D.   On: 06/01/2023 05:16   CT ABDOMEN PELVIS W CONTRAST Result Date: 05/31/2023 CLINICAL DATA:  Abdominal pain, acute, nonlocalized EXAM: CT ABDOMEN AND PELVIS WITH CONTRAST TECHNIQUE: Multidetector CT imaging of the abdomen and pelvis was performed using the standard protocol following bolus administration of intravenous contrast. RADIATION DOSE REDUCTION: This exam was performed according to the departmental dose-optimization program which includes automated exposure control, adjustment of the mA and/or kV according to patient size and/or use of iterative reconstruction technique. CONTRAST:  75mL OMNIPAQUE IOHEXOL 350 MG/ML SOLN COMPARISON:  CT abdomen pelvis 06/26/2022 FINDINGS: Lower chest: No acute abnormality.  Prominent heart size. Hepatobiliary: No focal liver abnormality. No gallstones, gallbladder wall thickening, or pericholecystic fluid. No biliary dilatation. Pancreas: No focal lesion. Normal pancreatic contour. No surrounding inflammatory changes. No main pancreatic ductal dilatation. Spleen: Normal in size without focal abnormality. Adrenals/Urinary Tract: No adrenal nodule bilaterally. Bilateral kidneys enhance symmetrically. No hydronephrosis. No hydroureter. The urinary bladder is unremarkable. Stomach/Bowel: Stomach is within normal limits. No evidence of bowel wall thickening or dilatation. Appendix appears normal. Vascular/Lymphatic: No abdominal aorta or iliac aneurysm. No abdominal, pelvic, or inguinal lymphadenopathy. Reproductive: Question enlarged difficult to measure  heterogeneous prostate. Other: No intraperitoneal free fluid. No intraperitoneal free gas. No organized fluid collection. Musculoskeletal: Similar-appearing small fat containing umbilical hernia. No suspicious lytic or blastic osseous lesions. No acute displaced fracture. Multilevel degenerative changes of the spine. Marked Schmorl node formation along the L3-L4 levels. IMPRESSION: 1. Prominent heart size. 2. Question enlarged difficult to measure heterogeneous prostate. Consider correlation with physical exam. 3. Small fat containing umbilical hernia. Electronically Signed   By: Tish Frederickson M.D.   On: 05/31/2023 20:03     EKG:  Vent. rate 100 BPM PR interval 172 ms QRS duration 108 ms QT/QTcB 370/477 ms P-R-T axes 56 35 56 Normal sinus rhythm Minimal voltage criteria for LVH, may be normal variant ( Cornell product ) Borderline ECG   Assessment & Plan:    Active Problems:   Essential hypertension   CKD (chronic kidney disease) stage 2, GFR 60-89 ml/min   HIV infection (HCC)   MDD (major depressive disorder), recurrent episode, severe (HCC)   DKA (diabetic ketoacidosis) (HCC)   Diabetes mellitus, type I with DKA -Presents with DKA, bicarb low at 18, anion gap elevated at 17. -Admitted under DKA protocol, continue with insulin drip, IV fluids, continue to monitor MPN BHA every 4 hours. -Transition to home dose insulin and subcu insulin sliding scale regimen when anion gap closed and able tolerate p.o.   Asthma Stable, no active wheezing, not on inhalers at home, will keep on as needed inhalers   HIV Continue with Genvoya   Hypertension - patient with AKI Diovan/hydrochlorothiazide -Continue with scheduled p.o. hydralazine and will add as needed meds as well  AKI  on CKD stage IIIa -Following depletion and dehydration, hold nephrotoxic medication and continue with IV fluids   Abd pain, nausea and vomiting -No clear etiology on CT scan, most likely due to DKA -Appears  within normal limit -Patient with fever of 100, UA is negative, will check procalcitonin and blood cultures  History of syphilis -During recent lab check in ID clinic, RPR titer 1:16, he has outpatient scheduled with ID on 06/12/2023  DVT Prophylaxis Heparin   AM Labs Ordered, also please review Full Orders  Family Communication: Admission, patients condition and plan of care including tests being ordered have been discussed with the patient who indicate understanding and agree with the plan and Code Status.  Code Status full  Likely DC to  home  Consults called: none    Admission status: inpatient     Time spent in minutes : 70 minutes   Huey Bienenstock M.D on 06/01/2023 at 7:29 AM   [facility] - Office  [phone]

## 2023-06-01 NOTE — ED Provider Notes (Incomplete)
 [place] [facility] DE[facility]MENT AT [facility] Provider Note   CSN: [id number] Arrival date & time: 06/01/23  0340     History {Add pertinent medical, surgical, social history, OB history to HPI:1} Chief Complaint  Patient presents with  . Emesis    Hyperglycemia     Tayshun Danirus Mandigo is a 38 y.o. male.   Emesis   38 year old male presents again to the emergency department with complaints of nausea, vomiting, diarrhea, abdominal pain.  Was seen yesterday for the same symptoms previous visit was feeling well upon discharge.  Was waiting for a few hours in the lobby for his ride when symptoms began again.  Was not able to pick up medications.  States that he is fearful to go home again that his symptoms may return.  States his symptoms began yesterday.  Denies any known sick contact.  Denies any cough, congestion, shortness of breath, hematemesis, melena/hematochezia.  Patient does state that he has developed some central chest pain when he began to vomit ever since prior discharge.  Past medical history significant for HIV on which he is compliant with his antiviral therapy with undetectable viral load in January, seizure, diabetes mellitus type 1, CKD 2, homelessness, asthma, syphilis, MDD  Home Medications Prior to Admission medications   Medication Sig Start Date End Date Taking? Authorizing Provider  acetaminophen (TYLENOL) 500 MG tablet Take 2 tablets (1,000 mg total) by mouth every 8 (eight) hours as needed for moderate pain (pain score 4-6), fever, mild pain (pain score 1-3) or headache. 05/31/23   Fayrene Helper, PA-C  Camphor-Menthol-Methyl Sal (TIGER BALM MUSCLE RUB EX) Apply 1 Application topically as needed.    [provider]  elvitegravir-cobicistat-emtricitabine-tenofovir (GENVOYA) 150-150-200-10 MG TABS tablet Take 1 tablet by mouth daily with breakfast. 09/14/22   Veryl Speak, FNP  hydrALAZINE (APRESOLINE) 25 MG tablet Take 1 tablet (25 mg total)  by mouth every 8 (eight) hours. For hypertension Patient taking differently: Take 50 mg by mouth every 8 (eight) hours. For hypertension 02/01/23   Claiborne Rigg, NP  ibuprofen (ADVIL) 200 MG tablet Take 600 mg by mouth as needed for mild pain (pain score 1-3) or headache.    [provider]  insulin glargine (LANTUS SOLOSTAR) 100 UNIT/ML Solostar Pen Inject 18 Units into the skin daily. 02/01/23   Claiborne Rigg, NP  insulin lispro (HUMALOG) 100 UNIT/ML KwikPen Inject 6 Units into the skin with breakfast, with lunch, and with evening meal. For blood sugars 0-150 give 0 units of insulin, 151-200 give 2 units of insulin, 201-250 give 4 units, 251-300 give 6 units, 301-350 give 8 units, 351-400 give 10 units,> 400 give 12 units Patient taking differently: Inject 6-12 Units into the skin with breakfast, with lunch, and with evening meal. For blood sugars 0-150 give 0 units of insulin, 151-200 give 2 units of insulin, 201-250 give 4 units, 251-300 give 6 units, 301-350 give 8 units, 351-400 give 10 units,> 400 give 12 units 02/01/23   Claiborne Rigg, NP  mirtazapine (REMERON) 15 MG tablet Take 0.5 tablets (7.5 mg total) by mouth at bedtime. Patient taking differently: Take 7.5 mg by mouth 3 (three) times a week. 10/31/22 04/06/23  Hughie Closs, MD  ondansetron (ZOFRAN) 4 MG tablet Take 1 tablet (4 mg total) by mouth every 8 (eight) hours as needed for nausea or vomiting. 05/31/23   Fayrene Helper, PA-C  sertraline (ZOLOFT) 100 MG tablet Take 1.5 tablets (150 mg total) by mouth daily.  02/01/23   Claiborne Rigg, NP  valsartan-hydrochlorothiazide (DIOVAN-HCT) 320-25 MG tablet Take 1 tablet by mouth daily. 02/01/23   Claiborne Rigg, NP  pravastatin (PRAVACHOL) 20 MG tablet Take 1 tablet (20 mg total) by mouth daily. Patient not taking: Reported on 03/31/2019 03/08/19 07/08/19  Claiborne Rigg, NP      Allergies    Shellfish allergy and Aspirin    Review of Systems   Review of Systems   Gastrointestinal:  Positive for vomiting.  All other systems reviewed and are negative.   Physical Exam Updated Vital Signs BP (!) 189/102 (BP Location: Right Arm)   Pulse 96   Temp 100 F (37.8 C)   Resp 20   SpO2 97%  Physical Exam Vitals and nursing note reviewed.  Constitutional:      General: He is not in acute distress.    Appearance: He is well-developed.  HENT:     Head: Normocephalic and atraumatic.  Eyes:     Conjunctiva/sclera: Conjunctivae normal.  Cardiovascular:     Rate and Rhythm: Normal rate and regular rhythm.     Heart sounds: No murmur heard. Pulmonary:     Effort: Pulmonary effort is normal. No respiratory distress.     Breath sounds: Normal breath sounds.  Abdominal:     Palpations: Abdomen is soft.     Tenderness: There is abdominal tenderness in the periumbilical area.  Musculoskeletal:        General: No swelling.     Cervical back: Neck supple.  Skin:    General: Skin is warm and dry.     Capillary Refill: Capillary refill takes less than 2 seconds.  Neurological:     Mental Status: He is alert.  Psychiatric:        Mood and Affect: Mood normal.     ED Results / Procedures / Treatments   Labs (all labs ordered are listed, but only abnormal results are displayed) Labs Reviewed  CBG MONITORING, ED - Abnormal; Notable for the following components:      Result Value   Glucose-Capillary 384 (*)    All other components within normal limits  RESP PANEL BY RT-PCR (RSV, FLU A&B, COVID)  RVPGX2  BASIC METABOLIC PANEL WITH GFR  CBC    EKG None  Radiology CT ABDOMEN PELVIS W CONTRAST Result Date: 05/31/2023 CLINICAL DATA:  Abdominal pain, acute, nonlocalized EXAM: CT ABDOMEN AND PELVIS WITH CONTRAST TECHNIQUE: Multidetector CT imaging of the abdomen and pelvis was performed using the standard protocol following bolus administration of intravenous contrast. RADIATION DOSE REDUCTION: This exam was performed according to the departmental  dose-optimization program which includes automated exposure control, adjustment of the mA and/or kV according to patient size and/or use of iterative reconstruction technique. CONTRAST:  75mL OMNIPAQUE IOHEXOL 350 MG/ML SOLN COMPARISON:  CT abdomen pelvis 06/26/2022 FINDINGS: Lower chest: No acute abnormality.  Prominent heart size. Hepatobiliary: No focal liver abnormality. No gallstones, gallbladder wall thickening, or pericholecystic fluid. No biliary dilatation. Pancreas: No focal lesion. Normal pancreatic contour. No surrounding inflammatory changes. No main pancreatic ductal dilatation. Spleen: Normal in size without focal abnormality. Adrenals/Urinary Tract: No adrenal nodule bilaterally. Bilateral kidneys enhance symmetrically. No hydronephrosis. No hydroureter. The urinary bladder is unremarkable. Stomach/Bowel: Stomach is within normal limits. No evidence of bowel wall thickening or dilatation. Appendix appears normal. Vascular/Lymphatic: No abdominal aorta or iliac aneurysm. No abdominal, pelvic, or inguinal lymphadenopathy. Reproductive: Question enlarged difficult to measure heterogeneous prostate. Other: No intraperitoneal free fluid. No intraperitoneal  free gas. No organized fluid collection. Musculoskeletal: Similar-appearing small fat containing umbilical hernia. No suspicious lytic or blastic osseous lesions. No acute displaced fracture. Multilevel degenerative changes of the spine. Marked Schmorl node formation along the L3-L4 levels. IMPRESSION: 1. Prominent heart size. 2. Question enlarged difficult to measure heterogeneous prostate. Consider correlation with physical exam. 3. Small fat containing umbilical hernia. Electronically Signed   By: Tish Frederickson M.D.   On: 05/31/2023 20:03    Procedures Procedures  {Document cardiac monitor, telemetry assessment procedure when appropriate:1}  Medications Ordered in ED Medications  sodium chloride 0.9 % bolus 1,000 mL (has no administration  in time range)  promethazine (PHENERGAN) 12.5 mg in sodium chloride 0.9 % 50 mL IVPB (has no administration in time range)  loperamide (IMODIUM) capsule 4 mg (has no administration in time range)  penicillin g benzathine (BICILLIN LA) 1200000 UNIT/2ML injection 2.4 Million Units (has no administration in time range)    ED Course/ Medical Decision Making/ A&P Clinical Course as of 06/01/23 2255  Thu Jun 01, 2023  0624 Reassessment of the patient still show patient with nausea.  Heart rate in the 1 teens.  Patient requesting admission. [CR]  9629 Abdominal pain and n/v. DKA rule out. H/o HIV and diabetes on insulin. RVR was positive. Ab scan was negative yesterday. Still vomiting, will likely need to come in for intractable n/v. [JR]    Clinical Course User Index [CR] Peter Garter, PA [JR] Gareth Eagle, PA-C   {   Click here for ABCD2, HEART and other calculatorsREFRESH Note before signing :1}                              Medical Decision Making Amount and/or Complexity of Data Reviewed Labs: ordered. Radiology: ordered.  Risk OTC drugs. Prescription drug management. Decision regarding hospitalization.   This patient presents to the ED for concern of nausea, vomiting, abdominal pain, this involves an extensive number of treatment options, and is a complaint that carries with it a high risk of complications and morbidity.  The differential diagnosis includes gastritis, PUD, cholecystitis, SBO/LBO, volvulus, gastroenteritis, foodborne illness, other   Co morbidities that complicate the patient evaluation  See HPI   Additional history obtained:  Additional history obtained from EMR External records from outside source obtained and reviewed including hospital records   Lab Tests:  I Ordered, and personally interpreted labs.  The pertinent results include: CBG 384.  Leukocytosis of 12.1.  No evidence of anemia.  Platelets within normal range.  Viral testing negative.   Pending metabolic panel   Imaging Studies ordered:  I ordered imaging studies including chest x-ray I independently visualized and interpreted imaging which showed no acute cardiopulmonary abnormality I agree with the radiologist interpretation   Cardiac Monitoring: / EKG:  The patient was maintained on a cardiac monitor.  I personally viewed and interpreted the cardiac monitored which showed an underlying rhythm of: sinus rhythm   Consultations Obtained:  N/a   Problem List / ED Course / Critical interventions / Medication management  Intractable nausea and vomiting I ordered medication including normal saline, Phenergan, Zofran, Imodium  Reevaluation of the patient after these medicines showed that the patient improved I have reviewed the patients home medicines and have made adjustments as needed   Social Determinants of Health:  Former cigarette use.  Denies illicit drug use.   Test / Admission - Considered:  Intractable nausea and vomiting  Vitals signs within normal range and stable throughout visit. Laboratory/imaging studies significant for: See above 38 year old male presents again to the emergency department with complaints of nausea, vomiting, diarrhea, abdominal pain.  Was seen yesterday for the same symptoms previous visit was feeling well upon discharge.  Was waiting for a few hours in the lobby for his ride when symptoms began again.  Was not able to pick up medications.  States that he is fearful to go home again that his symptoms may return.  States his symptoms began yesterday.  Denies any known sick contact.  Denies any cough, congestion, shortness of breath, hematemesis, melena/hematochezia.  Patient does state that he has developed some central chest pain when he began to vomit ever since prior discharge. *** Treatment plan were discussed at length with patient and they knowledge understanding was agreeable to said plan.  Appropriate consultations were made  as described in the ED course.  Patient was stable upon admission to the hospital.   {Document critical care time when appropriate:1} {Document review of labs and clinical decision tools ie heart score, Chads2Vasc2 etc:1}  {Document your independent review of radiology images, and any outside records:1} {Document your discussion with family members, caretakers, and with consultants:1} {Document social determinants of health affecting pt's care:1} {Document your decision making why or why not admission, treatments were needed:1} Final Clinical Impression(s) / ED Diagnoses Final diagnoses:  None    Rx / DC Orders ED Discharge Orders     None

## 2023-06-01 NOTE — ED Triage Notes (Signed)
 Patient reports emesis and diarrhea this week , seen here this evening , blood tests /CT scan done and discharged home .

## 2023-06-01 NOTE — ED Provider Notes (Signed)
 [place] EMERGENCY DEPARTMENT AT [facility] Provider Note   CSN: [id number] Arrival date & time: 06/01/23  0340     History {Add pertinent medical, surgical, social history, OB history to HPI:1} Chief Complaint  Patient presents with  . Emesis    Hyperglycemia     [name] is a 38 y.o. male.   Emesis   38 year old male presents again to the emergency department with complaints of nausea, vomiting, diarrhea, abdominal pain.  Was seen yesterday for the same symptoms previous visit was feeling well upon discharge.  Was waiting for a few hours in the lobby for his ride when symptoms began again.  Was not able to pick up medications.  States that he is fearful to go home again that his symptoms may return.  States his symptoms began yesterday.  Denies any known sick contact.  Denies any cough, congestion, shortness of breath, hematemesis, melena/hematochezia.  Patient does state that he has developed some central chest pain when he began to vomit ever since prior discharge.  Past medical history significant for HIV on which he is compliant with his antiviral therapy with undetectable viral load in January, seizure, diabetes mellitus type 1, CKD 2, homelessness, asthma, syphilis, MDD  Home Medications Prior to Admission medications   Medication Sig Start Date End Date Taking? Authorizing Provider  acetaminophen (TYLENOL) 500 MG tablet Take 2 tablets (1,000 mg total) by mouth every 8 (eight) hours as needed for moderate pain (pain score 4-6), fever, mild pain (pain score 1-3) or headache. 05/31/23   Fayrene Helper, PA-C  Camphor-Menthol-Methyl Sal (TIGER BALM MUSCLE RUB EX) Apply 1 Application topically as needed.    [provider]  elvitegravir-cobicistat-emtricitabine-tenofovir (GENVOYA) 150-150-200-10 MG TABS tablet Take 1 tablet by mouth daily with breakfast. 09/14/22   Veryl Speak, FNP  hydrALAZINE (APRESOLINE) 25 MG tablet Take 1 tablet (25 mg total)  by mouth every 8 (eight) hours. For hypertension Patient taking differently: Take 50 mg by mouth every 8 (eight) hours. For hypertension 02/01/23   Claiborne Rigg, NP  ibuprofen (ADVIL) 200 MG tablet Take 600 mg by mouth as needed for mild pain (pain score 1-3) or headache.    [provider]  insulin glargine (LANTUS SOLOSTAR) 100 UNIT/ML Solostar Pen Inject 18 Units into the skin daily. 02/01/23   Claiborne Rigg, NP  insulin lispro (HUMALOG) 100 UNIT/ML KwikPen Inject 6 Units into the skin with breakfast, with lunch, and with evening meal. For blood sugars 0-150 give 0 units of insulin, 151-200 give 2 units of insulin, 201-250 give 4 units, 251-300 give 6 units, 301-350 give 8 units, 351-400 give 10 units,> 400 give 12 units Patient taking differently: Inject 6-12 Units into the skin with breakfast, with lunch, and with evening meal. For blood sugars 0-150 give 0 units of insulin, 151-200 give 2 units of insulin, 201-250 give 4 units, 251-300 give 6 units, 301-350 give 8 units, 351-400 give 10 units,> 400 give 12 units 02/01/23   Claiborne Rigg, NP  mirtazapine (REMERON) 15 MG tablet Take 0.5 tablets (7.5 mg total) by mouth at bedtime. Patient taking differently: Take 7.5 mg by mouth 3 (three) times a week. 10/31/22 04/06/23  Hughie Closs, MD  ondansetron (ZOFRAN) 4 MG tablet Take 1 tablet (4 mg total) by mouth every 8 (eight) hours as needed for nausea or vomiting. 05/31/23   Fayrene Helper, PA-C  sertraline (ZOLOFT) 100 MG tablet Take 1.5 tablets (150 mg total) by mouth daily.  02/01/23   Claiborne Rigg, NP  valsartan-hydrochlorothiazide (DIOVAN-HCT) 320-25 MG tablet Take 1 tablet by mouth daily. 02/01/23   Claiborne Rigg, NP  pravastatin (PRAVACHOL) 20 MG tablet Take 1 tablet (20 mg total) by mouth daily. Patient not taking: Reported on 03/31/2019 03/08/19 07/08/19  Claiborne Rigg, NP      Allergies    Shellfish allergy and Aspirin    Review of Systems   Review of Systems   Gastrointestinal:  Positive for vomiting.  All other systems reviewed and are negative.   Physical Exam Updated Vital Signs BP (!) 189/102 (BP Location: Right Arm)   Pulse 96   Temp 100 F (37.8 C)   Resp 20   SpO2 97%  Physical Exam Vitals and nursing note reviewed.  Constitutional:      General: He is not in acute distress.    Appearance: He is well-developed.  HENT:     Head: Normocephalic and atraumatic.  Eyes:     Conjunctiva/sclera: Conjunctivae normal.  Cardiovascular:     Rate and Rhythm: Normal rate and regular rhythm.     Heart sounds: No murmur heard. Pulmonary:     Effort: Pulmonary effort is normal. No respiratory distress.     Breath sounds: Normal breath sounds.  Abdominal:     Palpations: Abdomen is soft.     Tenderness: There is abdominal tenderness in the periumbilical area.  Musculoskeletal:        General: No swelling.     Cervical back: Neck supple.  Skin:    General: Skin is warm and dry.     Capillary Refill: Capillary refill takes less than 2 seconds.  Neurological:     Mental Status: He is alert.  Psychiatric:        Mood and Affect: Mood normal.     ED Results / Procedures / Treatments   Labs (all labs ordered are listed, but only abnormal results are displayed) Labs Reviewed  CBG MONITORING, ED - Abnormal; Notable for the following components:      Result Value   Glucose-Capillary 384 (*)    All other components within normal limits  RESP PANEL BY RT-PCR (RSV, FLU A&B, COVID)  RVPGX2  BASIC METABOLIC PANEL WITH GFR  CBC    EKG None  Radiology CT ABDOMEN PELVIS W CONTRAST Result Date: 05/31/2023 CLINICAL DATA:  Abdominal pain, acute, nonlocalized EXAM: CT ABDOMEN AND PELVIS WITH CONTRAST TECHNIQUE: Multidetector CT imaging of the abdomen and pelvis was performed using the standard protocol following bolus administration of intravenous contrast. RADIATION DOSE REDUCTION: This exam was performed according to the departmental  dose-optimization program which includes automated exposure control, adjustment of the mA and/or kV according to patient size and/or use of iterative reconstruction technique. CONTRAST:  75mL OMNIPAQUE IOHEXOL 350 MG/ML SOLN COMPARISON:  CT abdomen pelvis 06/26/2022 FINDINGS: Lower chest: No acute abnormality.  Prominent heart size. Hepatobiliary: No focal liver abnormality. No gallstones, gallbladder wall thickening, or pericholecystic fluid. No biliary dilatation. Pancreas: No focal lesion. Normal pancreatic contour. No surrounding inflammatory changes. No main pancreatic ductal dilatation. Spleen: Normal in size without focal abnormality. Adrenals/Urinary Tract: No adrenal nodule bilaterally. Bilateral kidneys enhance symmetrically. No hydronephrosis. No hydroureter. The urinary bladder is unremarkable. Stomach/Bowel: Stomach is within normal limits. No evidence of bowel wall thickening or dilatation. Appendix appears normal. Vascular/Lymphatic: No abdominal aorta or iliac aneurysm. No abdominal, pelvic, or inguinal lymphadenopathy. Reproductive: Question enlarged difficult to measure heterogeneous prostate. Other: No intraperitoneal free fluid. No intraperitoneal  free gas. No organized fluid collection. Musculoskeletal: Similar-appearing small fat containing umbilical hernia. No suspicious lytic or blastic osseous lesions. No acute displaced fracture. Multilevel degenerative changes of the spine. Marked Schmorl node formation along the L3-L4 levels. IMPRESSION: 1. Prominent heart size. 2. Question enlarged difficult to measure heterogeneous prostate. Consider correlation with physical exam. 3. Small fat containing umbilical hernia. Electronically Signed   By: Tish Frederickson M.D.   On: 05/31/2023 20:03    Procedures Procedures  {Document cardiac monitor, telemetry assessment procedure when appropriate:1}  Medications Ordered in ED Medications  sodium chloride 0.9 % bolus 1,000 mL (has no administration  in time range)  promethazine (PHENERGAN) 12.5 mg in sodium chloride 0.9 % 50 mL IVPB (has no administration in time range)  loperamide (IMODIUM) capsule 4 mg (has no administration in time range)  penicillin g benzathine (BICILLIN LA) 1200000 UNIT/2ML injection 2.4 Million Units (has no administration in time range)    ED Course/ Medical Decision Making/ A&P Clinical Course as of 06/01/23 0636  Thu Jun 01, 2023  0624 Reassessment of the patient still show patient with nausea.  Heart rate in the 1 teens.  Patient requesting admission. [CR]  6962 Abdominal pain and n/v. DKA rule out. H/o HIV and diabetes on insulin. RVR was positive. Ab scan was negative yesterday.  [JR]    Clinical Course User Index [CR] Peter Garter, PA [JR] Gareth Eagle, PA-C   {   Click here for ABCD2, HEART and other calculatorsREFRESH Note before signing :1}                              Medical Decision Making Amount and/or Complexity of Data Reviewed Labs: ordered. Radiology: ordered.  Risk Prescription drug management.   This patient presents to the ED for concern of nausea, vomiting, abdominal pain, this involves an extensive number of treatment options, and is a complaint that carries with it a high risk of complications and morbidity.  The differential diagnosis includes gastritis, PUD, cholecystitis, SBO/LBO, volvulus, gastroenteritis, foodborne illness, other   Co morbidities that complicate the patient evaluation  See HPI   Additional history obtained:  Additional history obtained from EMR External records from outside source obtained and reviewed including hospital records   Lab Tests:  I Ordered, and personally interpreted labs.  The pertinent results include: CBG 384.  Leukocytosis of 12.1.  No evidence of anemia.  Platelets within normal range.  Viral testing negative.***   Imaging Studies ordered:  I ordered imaging studies including chest x-ray I independently visualized  and interpreted imaging which showed no acute cardiopulmonary abnormality I agree with the radiologist interpretation   Cardiac Monitoring: / EKG:  The patient was maintained on a cardiac monitor.  I personally viewed and interpreted the cardiac monitored which showed an underlying rhythm of: sinus rhythm   Consultations Obtained:  I requested consultation with the ***,  and discussed lab and imaging findings as well as pertinent plan - they recommend: ***   Problem List / ED Course / Critical interventions / Medication management  *** I ordered medication including ***  for ***  Reevaluation of the patient after these medicines showed that the patient {resolved/improved/worsened:23923::"improved"} I have reviewed the patients home medicines and have made adjustments as needed   Social Determinants of Health:  ***   Test / Admission - Considered:  *** Vitals signs significant for ***. Otherwise within normal range and stable throughout  visit. Laboratory/imaging studies significant for: See above *** Treatment plan were discussed at length with patient and they knowledge understanding was agreeable to said plan.  Appropriate consultations were made as described in the ED course.  Patient was stable upon admission to the hospital.   {Document critical care time when appropriate:1} {Document review of labs and clinical decision tools ie heart score, Chads2Vasc2 etc:1}  {Document your independent review of radiology images, and any outside records:1} {Document your discussion with family members, caretakers, and with consultants:1} {Document social determinants of health affecting pt's care:1} {Document your decision making why or why not admission, treatments were needed:1} Final Clinical Impression(s) / ED Diagnoses Final diagnoses:  None    Rx / DC Orders ED Discharge Orders     None

## 2023-06-01 NOTE — Plan of Care (Signed)

## 2023-06-01 NOTE — Inpatient Diabetes Management (Signed)
 Inpatient Diabetes Program Recommendations  AACE/ADA: New Consensus Statement on Inpatient Glycemic Control (2015)  Target Ranges:  Prepandial:   less than 140 mg/dL      Peak postprandial:   less than 180 mg/dL (1-2 hours)      Critically ill patients:  140 - 180 mg/dL   Lab Results  Component Value Date   GLUCAP 142 (H) 06/01/2023   HGBA1C 9.4 (H) 06/01/2023    Review of Glycemic Control  Diabetes history: DM type 1  Outpatient Diabetes medications: Lantus 18 units , Humalog 6 units tid + SSI tid Current orders for Inpatient glycemic control:  IV insulin transitioning to SQ Semglee 18 units Q24 hours Novolog 0-15 units tid + hs  A1c 9.4% on 4/3  Spoke with pt at bedside regarding A1c level of 9.4% and glucose control at home. Pt is followed by the [facility]. Pt reports in the past he has taken insulin without eating and has experienced hypoglycemia. Discussed with pt to formulate sick day guidelines to take a portion of his insulin at home when he is feeling unwell and unable to eat. Discussed with pt he needs a portion of insulin to stay out of DKA.  Pt reports needing refills of insulin and testing supplies at time of discharge.  Thanks,  Christena Deem RN, MSN, BC-ADM Inpatient Diabetes Coordinator Team Pager [phone] (8a-5p)

## 2023-06-01 NOTE — Progress Notes (Signed)
   06/01/23 1432  TOC Brief Assessment  Insurance and Status Reviewed Koren Shiver Tailored Plan)  Patient has primary care physician Yes (Available  Storm Frisk, MD)  Home environment has been reviewed From home  Prior level of function: Independent  Prior/Current Home Services No current home services  Social Drivers of Health Review SDOH reviewed needs interventions  Readmission risk has been reviewed Yes (15%)  Transition of care needs transition of care needs identified, TOC will continue to follow   Patient has confirmed his address and denies being homeless. He does not use or own any DME. He has not had home health in the past. He will need a cab voucher at DC and [facility]  will provide    TOC will continue to follow patient for any additional discharge needs

## 2023-06-01 NOTE — ED Provider Notes (Signed)
 Accepted handoff at shift change from [facility], [place]. Please see prior provider note for more detail.   Briefly: Patient is 38 y.o. type I diabetic with history of HIV presenting for intractable nausea and vomiting.  DDX: concern for DKA, gastritis, PUD, cholecystitis, SBO/LBO, volvulus, gastroenteritis, foodborne illness, other   Plan: Follow-up on pending labs and reassess, will likely need admission for intractable nausea and vomiting and/or DKA   Physical Exam  BP (!) 155/85   Pulse (!) 108   Temp 100 F (37.8 C)   Resp 18   SpO2 100%   Physical Exam  Procedures  .Critical Care  Performed by: Gareth Eagle, PA-C Authorized by: Gareth Eagle, PA-C   Critical care provider statement:    Critical care time (minutes):  30   Critical care was necessary to treat or prevent imminent or life-threatening deterioration of the following conditions:  Metabolic crisis and renal failure (DKA with AKI)   Critical care was time spent personally by me on the following activities:  Development of treatment plan with patient or surrogate, discussions with consultants, evaluation of patient's response to treatment, examination of patient, ordering and review of laboratory studies, ordering and review of radiographic studies, ordering and performing treatments and interventions, pulse oximetry, re-evaluation of patient's condition and review of old charts   ED Course / MDM   Clinical Course as of 06/01/23 0653  Thu Jun 01, 2023  0624 Reassessment of the patient still show patient with nausea.  Heart rate in the 1 teens.  Patient requesting admission. [CR]  1914 Abdominal pain and n/v. DKA rule out. H/o HIV and diabetes on insulin. RVR was positive. Ab scan was negative yesterday. Still vomiting, will likely need to come in for intractable n/v. [JR]    Clinical Course User Index [CR] Peter Garter, PA [JR] Gareth Eagle, PA-C   Medical Decision Making Amount and/or  Complexity of Data Reviewed Labs: ordered. Radiology: ordered.  Risk OTC drugs. Prescription drug management.     Labs revealed increase of blood sugar to 439 from 384 with an anion gap and low bicarb and also evidence of AKI.  Started DKA treatment per protocol. Admitted to hospital service with Dr. Randol Kern.        Gareth Eagle, PA-C 06/01/23 7829    Gilda Crease, MD 06/02/23 [phone]

## 2023-06-01 NOTE — Progress Notes (Signed)
   06/01/23 1432  TOC Brief Assessment  Insurance and Status Reviewed Koren Shiver Tailored Plan)  Patient has primary care physician Yes (Available  Storm Frisk, MD)  Home environment has been reviewed From home  Prior level of function: Independent  Prior/Current Home Services No current home services  Social Drivers of Health Review SDOH reviewed needs interventions  Readmission risk has been reviewed Yes (15%)  Transition of care needs transition of care needs identified, TOC will continue to follow   Please place a consult for any TOC needs

## 2023-06-02 ENCOUNTER — Other Ambulatory Visit (HOSPITAL_COMMUNITY): Payer: Self-pay

## 2023-06-02 ENCOUNTER — Telehealth (HOSPITAL_COMMUNITY): Payer: Self-pay

## 2023-06-02 DIAGNOSIS — E1010 Type 1 diabetes mellitus with ketoacidosis without coma: Secondary | ICD-10-CM | POA: Diagnosis not present

## 2023-06-02 LAB — GLUCOSE, CAPILLARY
Glucose-Capillary: 177 mg/dL — ABNORMAL HIGH (ref 70–99)
Glucose-Capillary: 253 mg/dL — ABNORMAL HIGH (ref 70–99)
Glucose-Capillary: 83 mg/dL (ref 70–99)
Glucose-Capillary: 113 mg/dL — ABNORMAL HIGH (ref 70–99)
Glucose-Capillary: 69 mg/dL — ABNORMAL LOW (ref 70–99)

## 2023-06-02 LAB — SODIUM, URINE, RANDOM: Sodium, Ur: 71 mmol/L

## 2023-06-02 LAB — BETA-HYDROXYBUTYRIC ACID: Beta-Hydroxybutyric Acid: 0.16 mmol/L (ref 0.05–0.27)

## 2023-06-02 MED ORDER — ONDANSETRON HCL 4 MG/2ML IJ SOLN
INTRAMUSCULAR | Status: AC
  Administered 2023-06-02: 4 mg via INTRAVENOUS
  Filled 2023-06-02: qty 2

## 2023-06-02 MED ORDER — AMLODIPINE BESYLATE 5 MG PO TABS
5.0000 mg | ORAL_TABLET | Freq: Every day | ORAL | Status: DC
  Administered 2023-06-02 – 2023-06-03 (×2): 5 mg via ORAL
  Filled 2023-06-02 (×2): qty 1

## 2023-06-02 MED ORDER — ONDANSETRON HCL 4 MG/2ML IJ SOLN: 4.0000 mg | Freq: Four times a day (QID) | INTRAMUSCULAR | Status: DC | PRN

## 2023-06-02 NOTE — Telephone Encounter (Signed)
 Pharmacy Patient Advocate Encounter  Insurance verification completed.    The patient is insured through [employer] [place].     Ran test claim for Lantus and the current 30 day co-pay is $4.00.  Ran test claim for Humalog and the current 30 day co-pay is $4.00.  This test claim was processed through [facility]- copay amounts may vary at other pharmacies due to pharmacy/plan contracts, or as the patient moves through the different stages of their insurance plan.

## 2023-06-02 NOTE — TOC Progression Note (Addendum)
 Transition of Care [facility]) - Progression Note    Patient Details  Name: [name] MRN: [id number] Date of Birth: 02-Aug-1985  Transition of Care [facility]) CM/SW Contact  Gordy Clement, RN Phone Number: 06/02/2023, 3:33 PM  Clinical Narrative:     Patient will need Cab Voucher at DC. voucher placed in patients chart .Do not anticipate any other DC needs   Medications have been priced by pharmacy per note         Expected Discharge Plan and Services                                               Social Determinants of Health (SDOH) Interventions SDOH Screenings   Food Insecurity: No Food Insecurity (06/01/2023)  Housing: High Risk (06/01/2023)  Transportation Needs: Unmet Transportation Needs (06/01/2023)  Utilities: At Risk (06/01/2023)  Depression (PHQ2-9): Medium Risk (01/06/2023)  Financial Resource Strain: High Risk (01/06/2023)  Physical Activity: Inactive (01/06/2023)  Social Connections: Socially Isolated (01/06/2023)  Stress: Stress Concern Present (01/06/2023)  Tobacco Use: Medium Risk (06/01/2023)  Health Literacy: Adequate Health Literacy (01/06/2023)    Readmission Risk Interventions     No data to display

## 2023-06-02 NOTE — Progress Notes (Signed)
 PROGRESS NOTE    [name]  ZOX:[id number] DOB: Jun 17, 1985 DOA: 06/01/2023 PCP: Storm Frisk, MD (Confirm with patient/family/NH records and if not entered, this HAS to be entered at [facility] point of entry. "No PCP" if truly none.)   Chief Complaint  Patient presents with   Emesis    Hyperglycemia     Brief Narrative:   [name]  is a 38 y.o. male, with medical history significant of asthma, anxiety/depression, HIV, type 1 diabetes, CKD stage IIIa, hypertension, hyperlipidemia, marijuana abuse.  With previous admission in the past due to uncontrolled diabetes with either hypoglycemia or hyperglycemia.   -Presents to ED secondary to complaints of nausea, vomiting and hyperglycemia, patient was in ED yesterday for complaints of nausea, vomiting and abdominal pain, CT abdomen pelvis with no acute finding, UA was negative, while he was discharged home, he presents again today with significant nausea and vomiting and hyperglycemia, denies any chest pain, shortness of breath, urinary symptoms.. -In ED he was found to be in DKA, with temperature of 100, white blood cell count of 12, potassium of 5.4, creatinine up to 2.4, with blood glucose of 439 with anion gap of 17, started on insulin drip, patient was admitted for further management Assessment & Plan:   Active Problems:   Essential hypertension   CKD (chronic kidney disease) stage 2, GFR 60-89 ml/min   HIV infection (HCC)   MDD (major depressive disorder), recurrent episode, severe (HCC)   DKA (diabetic ketoacidosis) (HCC)   Diabetes mellitus, type I with DKA -Presents with DKA, bicarb low at 18, anion gap elevated at 17. -Admitted under DKA protocol, continue with insulin drip, IV fluids, continue to monitor MPN BHA every 4 hours. -Anion gap has closed, he was transitioned to home dose insulin with insulin sliding scale    Asthma Stable, no active wheezing, not on inhalers at home, will keep on as needed inhalers    HIV Continue with Genvoya   Hypertension - patient with AKI Diovan/hydrochlorothiazide -Continue with scheduled p.o. hydralazine, blood pressure remains elevated so we will add Norvasc for now   AKI  on CKD stage IIIa -Following depletion and dehydration, hold nephrotoxic medication and continue with IV fluids -Will check UA, urine cultures and urine sodium received contrast couple days ago, rule out contrast nephropathy   Abd pain, nausea and vomiting -No clear etiology on CT scan, most likely due to DKA -Appears within normal limit -Patient with fever of 100 on admission, UA is negative, procalcitonin reassuring at 0.19, follow-up blood cultures, continue to monitor off antibiotics  History of syphilis -During recent lab check in ID clinic, RPR titer 1:16, he has outpatient scheduled with ID on 06/12/2023, patient received treatment, and apparently his RPR titer trending down.     DVT prophylaxis: Subcutaneous heparin Code Status: full Family Communication: none at bedside Disposition:   Status is: Inpatient    Consultants:  None   Subjective:  No significant events overnight, he reports mild nausea,  still poor appetite  Objective: Vitals:   06/02/23 0400 06/02/23 0436 06/02/23 0822 06/02/23 1226  BP:  (!) 162/114 (!) 168/107 (!) 160/98  Pulse: 72   81  Resp: 17     Temp:  98.8 F (37.1 C) 98.6 F (37 C) 99.1 F (37.3 C)  TempSrc:  Oral Oral Oral  SpO2: 96% 97%      Intake/Output Summary (Last 24 hours) at 06/02/2023 1627 Last data filed at 06/02/2023 1608 Gross per 24 hour  Intake 641.38 ml  Output 3300 ml  Net -2658.62 ml   There were no vitals filed for this visit.  Examination:  Awake Alert, Oriented X 3, No new F.N deficits, Normal affect Symmetrical Chest wall movement, Good air movement bilaterally, CTAB RRR,No Gallops,Rubs or new Murmurs, No Parasternal Heave +ve B.Sounds, Abd Soft, No tenderness, No rebound - guarding or rigidity. No  Cyanosis, Clubbing or edema, No new Rash or bruise      Data Reviewed: I have personally reviewed following labs and imaging studies  CBC: Recent Labs  Lab 05/31/23 1427 06/01/23 0527 06/01/23 0715 06/01/23 1230  WBC 9.7 12.1*  --  13.2*  NEUTROABS 8.5*  --   --   --   HGB 14.2 14.2 14.6 13.1  HCT 41.7 42.3 43.0 38.6*  MCV 90.7 91.4  --  91.3  PLT 271 264  --  259    Basic Metabolic Panel: Recent Labs  Lab 05/31/23 1427 06/01/23 0527 06/01/23 0715 06/01/23 1229 06/01/23 2326  NA 142 134* 132* 138 134*  K 5.0 5.2* 5.4* 4.2 4.3  CL 106 99  --  106 104  CO2 26 18*  --  20* 22  GLUCOSE 109* 439*  --  124* 230*  BUN 19 36*  --  41* 42*  CREATININE 1.54* 2.47*  --  2.61* 2.49*  CALCIUM 9.8 9.3  --  8.6* 8.6*    GFR: CrCl cannot be calculated (Unknown ideal weight.).  Liver Function Tests: Recent Labs  Lab 05/31/23 1427  AST 26  ALT 17  ALKPHOS 53  BILITOT 0.6  PROT 7.0  ALBUMIN 3.8    CBG: Recent Labs  Lab 06/01/23 1504 06/01/23 1621 06/01/23 2112 06/02/23 0826 06/02/23 1225  GLUCAP 142* 150* 260* 177* 253*     Recent Results (from the past 240 hours)  CT/NG RNA, TMA Rectal     Status: None   Collection Time: 05/29/23 10:48 AM   Specimen: Sterile Swab  Result Value Ref Range Status   Chlamydia Trachomatis RNA NOT DETECTED NOT DETECTED Final   Neisseria Gonorrhoeae RNA NOT DETECTED NOT DETECTED Final    Comment: The analytical performance characteristics of this assay have been determined by [employer]. The modifications have not been cleared or approved by the FDA. This assay has been validated pursuant to the CLIA regulations and is used for clinical purposes. Salena Saner trachomatis/N. gonorrhoeae RNA     Status: None   Collection Time: 05/29/23 10:48 AM   Specimen: Urine  Result Value Ref Range Status   C. trachomatis RNA, TMA NOT DETECTED NOT DETECTED Final   N. gonorrhoeae RNA, TMA NOT DETECTED NOT DETECTED Final    Comment: The  analytical performance characteristics of this assay, when used to test SurePath(TM) specimens have been determined by [employer]. The modifications have not been cleared or approved by the FDA. This assay has been validated pursuant to the CLIA regulations and is used for clinical purposes. . For additional information, please refer to https://education.questdiagnostics.com/faq/FAQ154 (This link is being provided for information/ educational purposes only.) .   GC/CT Probe, Amp (Throat)     Status: None   Collection Time: 05/29/23 10:48 AM   Specimen: Sterile Swab  Result Value Ref Range Status   Chlamydia trachomatis RNA NOT DETECTED NOT DETECTED Final   Neisseria gonorrhoeae RNA NOT DETECTED NOT DETECTED Final    Comment: The analytical performance characteristics of this assay have been determined by [employer]. The modifications  have not been cleared or approved by the FDA. This assay has been validated pursuant to the CLIA regulations and is used for clinical purposes. Marland Kitchen   Resp panel by RT-PCR (RSV, Flu A&B, Covid) Anterior Nasal Swab     Status: None   Collection Time: 06/01/23  5:27 AM   Specimen: Anterior Nasal Swab  Result Value Ref Range Status   SARS Coronavirus 2 by RT PCR NEGATIVE NEGATIVE Final   Influenza A by PCR NEGATIVE NEGATIVE Final   Influenza B by PCR NEGATIVE NEGATIVE Final    Comment: (NOTE) The Xpert Xpress SARS-CoV-2/FLU/RSV plus assay is intended as an aid in the diagnosis of influenza from Nasopharyngeal swab specimens and should not be used as a sole basis for treatment. Nasal washings and aspirates are unacceptable for Xpert Xpress SARS-CoV-2/FLU/RSV testing.  Fact Sheet for Patients: BloggerCourse.com  Fact Sheet for Healthcare Providers: SeriousBroker.it  This test is not yet approved or cleared by the [place] FDA and has been authorized for detection and/or diagnosis  of SARS-CoV-2 by FDA under an Emergency Use Authorization (EUA). This EUA will remain in effect (meaning this test can be used) for the duration of the COVID-19 declaration under Section 564(b)(1) of the Act, 21 U.S.C. section 360bbb-3(b)(1), unless the authorization is terminated or revoked.     Resp Syncytial Virus by PCR NEGATIVE NEGATIVE Final    Comment: (NOTE) Fact Sheet for Patients: BloggerCourse.com  Fact Sheet for Healthcare Providers: SeriousBroker.it  This test is not yet approved or cleared by the [place] FDA and has been authorized for detection and/or diagnosis of SARS-CoV-2 by FDA under an Emergency Use Authorization (EUA). This EUA will remain in effect (meaning this test can be used) for the duration of the COVID-19 declaration under Section 564(b)(1) of the Act, 21 U.S.C. section 360bbb-3(b)(1), unless the authorization is terminated or revoked.  Performed at [facility], [address] [place], [place] [address]   Culture, blood (Routine X 2) w Reflex to ID Panel     Status: None (Preliminary result)   Collection Time: 06/01/23 12:29 PM   Specimen: BLOOD LEFT HAND  Result Value Ref Range Status   Specimen Description BLOOD LEFT HAND  Final   Special Requests   Final    BOTTLES DRAWN AEROBIC AND ANAEROBIC Blood Culture adequate volume   Culture   Final    NO GROWTH < 24 HOURS Performed at [facility], [address], [place], [place] [address]    Report Status PENDING  Incomplete  Culture, blood (Routine X 2) w Reflex to ID Panel     Status: None (Preliminary result)   Collection Time: 06/01/23 12:29 PM   Specimen: BLOOD LEFT HAND  Result Value Ref Range Status   Specimen Description BLOOD LEFT HAND  Final   Special Requests   Final    BOTTLES DRAWN AEROBIC AND ANAEROBIC Blood Culture adequate volume   Culture   Final    NO GROWTH < 24 HOURS Performed at [facility],  [address] [place], [place] [address]    Report Status PENDING  Incomplete         Radiology Studies: DG Chest Port 1 View Result Date: 06/01/2023 CLINICAL DATA:  Chest pain, vomiting and diarrhea. EXAM: PORTABLE CHEST 1 VIEW COMPARISON:  PA and lateral chest 11/29/2022 FINDINGS: The heart size and mediastinal contours are within normal limits. Both lungs are clear. The visualized skeletal structures are unremarkable. IMPRESSION: No active  disease.  Unchanged. Electronically Signed   By: Almira Bar M.D.   On: 06/01/2023 05:16   CT ABDOMEN PELVIS W CONTRAST Result Date: 05/31/2023 CLINICAL DATA:  Abdominal pain, acute, nonlocalized EXAM: CT ABDOMEN AND PELVIS WITH CONTRAST TECHNIQUE: Multidetector CT imaging of the abdomen and pelvis was performed using the standard protocol following bolus administration of intravenous contrast. RADIATION DOSE REDUCTION: This exam was performed according to the departmental dose-optimization program which includes automated exposure control, adjustment of the mA and/or kV according to patient size and/or use of iterative reconstruction technique. CONTRAST:  75mL OMNIPAQUE IOHEXOL 350 MG/ML SOLN COMPARISON:  CT abdomen pelvis 06/26/2022 FINDINGS: Lower chest: No acute abnormality.  Prominent heart size. Hepatobiliary: No focal liver abnormality. No gallstones, gallbladder wall thickening, or pericholecystic fluid. No biliary dilatation. Pancreas: No focal lesion. Normal pancreatic contour. No surrounding inflammatory changes. No main pancreatic ductal dilatation. Spleen: Normal in size without focal abnormality. Adrenals/Urinary Tract: No adrenal nodule bilaterally. Bilateral kidneys enhance symmetrically. No hydronephrosis. No hydroureter. The urinary bladder is unremarkable. Stomach/Bowel: Stomach is within normal limits. No evidence of bowel wall thickening or dilatation. Appendix appears normal. Vascular/Lymphatic: No abdominal aorta or iliac aneurysm. No  abdominal, pelvic, or inguinal lymphadenopathy. Reproductive: Question enlarged difficult to measure heterogeneous prostate. Other: No intraperitoneal free fluid. No intraperitoneal free gas. No organized fluid collection. Musculoskeletal: Similar-appearing small fat containing umbilical hernia. No suspicious lytic or blastic osseous lesions. No acute displaced fracture. Multilevel degenerative changes of the spine. Marked Schmorl node formation along the L3-L4 levels. IMPRESSION: 1. Prominent heart size. 2. Question enlarged difficult to measure heterogeneous prostate. Consider correlation with physical exam. 3. Small fat containing umbilical hernia. Electronically Signed   By: Tish Frederickson M.D.   On: 05/31/2023 20:03        Scheduled Meds:  amLODipine  5 mg Oral Daily   elvitegravir-cobicistat-emtricitabine-tenofovir  1 tablet Oral Q breakfast   heparin  5,000 Units Subcutaneous Q8H   insulin aspart  0-15 Units Subcutaneous TID WC   insulin aspart  0-5 Units Subcutaneous QHS   insulin glargine-yfgn  18 Units Subcutaneous Q24H   sertraline  150 mg Oral Daily   Continuous Infusions:  insulin Stopped (06/01/23 1622)     LOS: 1 day       Huey Bienenstock, MD [facility]   To contact the attending provider between 7A-7P or the covering provider during after hours 7P-7A, please log into the web site [email or web address] and access using universal [place] password for that web site. If you do not have the password, please call the hospital operator.  06/02/2023, 4:27 PM

## 2023-06-02 NOTE — Plan of Care (Signed)

## 2023-06-03 ENCOUNTER — Other Ambulatory Visit (HOSPITAL_COMMUNITY): Payer: Self-pay

## 2023-06-03 DIAGNOSIS — E1010 Type 1 diabetes mellitus with ketoacidosis without coma: Secondary | ICD-10-CM | POA: Diagnosis not present

## 2023-06-03 LAB — CBC
HCT: 38.6 % — ABNORMAL LOW (ref 39.0–52.0)
Hemoglobin: 13.4 g/dL (ref 13.0–17.0)
MCH: 30.7 pg (ref 26.0–34.0)
MCHC: 34.7 g/dL (ref 30.0–36.0)
MCV: 88.5 fL (ref 80.0–100.0)
Platelets: 231 10*3/uL (ref 150–400)
RBC: 4.36 MIL/uL (ref 4.22–5.81)
RDW: 13.9 % (ref 11.5–15.5)
WBC: 7.4 10*3/uL (ref 4.0–10.5)
nRBC: 0 % (ref 0.0–0.2)

## 2023-06-03 LAB — BASIC METABOLIC PANEL WITH GFR
Anion gap: 8 (ref 5–15)
BUN: 24 mg/dL — ABNORMAL HIGH (ref 6–20)
CO2: 24 mmol/L (ref 22–32)
Calcium: 9.1 mg/dL (ref 8.9–10.3)
Chloride: 106 mmol/L (ref 98–111)
Creatinine, Ser: 1.94 mg/dL — ABNORMAL HIGH (ref 0.61–1.24)
GFR, Estimated: 45 mL/min — ABNORMAL LOW (ref >60)
Glucose, Bld: 109 mg/dL — ABNORMAL HIGH (ref 70–99)
Potassium: 4.1 mmol/L (ref 3.5–5.1)
Sodium: 138 mmol/L (ref 135–145)

## 2023-06-03 LAB — MAGNESIUM: Magnesium: 1.7 mg/dL (ref 1.7–2.4)

## 2023-06-03 LAB — PHOSPHORUS: Phosphorus: 3.9 mg/dL (ref 2.5–4.6)

## 2023-06-03 LAB — GLUCOSE, CAPILLARY: Glucose-Capillary: 97 mg/dL (ref 70–99)

## 2023-06-03 MED ORDER — AMLODIPINE BESYLATE 5 MG PO TABS
5.0000 mg | ORAL_TABLET | Freq: Every day | ORAL | 0 refills | Status: DC
  Filled 2023-06-03: qty 30, 30d supply, fill #0

## 2023-06-03 MED ORDER — BLOOD GLUCOSE TEST VI STRP
1.0000 | ORAL_STRIP | Freq: Three times a day (TID) | 0 refills | Status: AC
  Filled 2023-06-03: qty 100, 30d supply, fill #0
  Filled 2023-06-03: qty 50, 17d supply, fill #0
  Filled 2023-06-13: qty 100, 34d supply, fill #0

## 2023-06-03 MED ORDER — VALSARTAN 40 MG PO TABS
40.0000 mg | ORAL_TABLET | Freq: Every day | ORAL | 0 refills | Status: DC
  Filled 2023-06-03: qty 30, 30d supply, fill #0

## 2023-06-03 MED ORDER — INSULIN GLARGINE 100 UNIT/ML SOLOSTAR PEN
14.0000 [IU] | PEN_INJECTOR | Freq: Every day | SUBCUTANEOUS | 0 refills | Status: DC
  Filled 2023-06-03 – 2023-06-13 (×2): qty 15, 107d supply, fill #0

## 2023-06-03 MED ORDER — INSULIN PEN NEEDLE 31G X 8 MM MISC
1.0000 | Freq: Three times a day (TID) | 0 refills | Status: AC
  Filled 2023-06-03 – 2023-06-13 (×2): qty 100, 30d supply, fill #0

## 2023-06-03 MED ORDER — BLOOD GLUCOSE MONITOR SYSTEM W/DEVICE KIT
1.0000 | PACK | Freq: Three times a day (TID) | 0 refills | Status: AC
  Filled 2023-06-03: qty 1, 1d supply, fill #0
  Filled 2023-06-13: qty 1, 30d supply, fill #0

## 2023-06-03 MED ORDER — INSULIN LISPRO (1 UNIT DIAL) 100 UNIT/ML (KWIKPEN)
6.0000 [IU] | PEN_INJECTOR | Freq: Three times a day (TID) | SUBCUTANEOUS | 0 refills | Status: DC
  Filled 2023-06-03 – 2023-06-13 (×2): qty 15, 84d supply, fill #0

## 2023-06-03 MED ORDER — LANCETS MISC
1.0000 | Freq: Three times a day (TID) | 0 refills | Status: AC
  Filled 2023-06-03: qty 100, 33d supply, fill #0
  Filled 2023-06-13: qty 100, 30d supply, fill #0

## 2023-06-03 MED ORDER — INSULIN GLARGINE-YFGN 100 UNIT/ML ~~LOC~~ SOLN
14.0000 [IU] | SUBCUTANEOUS | Status: DC
  Administered 2023-06-03: 14 [IU] via SUBCUTANEOUS
  Filled 2023-06-03: qty 0.14

## 2023-06-03 MED ORDER — LANCET DEVICE MISC
1.0000 | Freq: Three times a day (TID) | 0 refills | Status: AC
  Filled 2023-06-03: qty 1, 30d supply, fill #0

## 2023-06-03 NOTE — Discharge Instructions (Signed)
 Follow with Primary MD Storm Frisk, MD in 7 days   Get CBC, CMP,checked  by Primary MD next visit.    Activity: As tolerated with Full fall precautions use walker/cane & assistance as needed   Disposition Home    Diet: Heart Healthy /Carb modified  On your next visit with your primary care physician please Get Medicines reviewed and adjusted.   Please request your Prim.MD to go over all Hospital Tests and Procedure/Radiological results at the follow up, please get all Hospital records sent to your Prim MD by signing hospital release before you go home.   If you experience worsening of your admission symptoms, develop shortness of breath, life threatening emergency, suicidal or homicidal thoughts you must seek medical attention immediately by calling 911 or calling your MD immediately  if symptoms less severe.  You Must read complete instructions/literature along with all the possible adverse reactions/side effects for all the Medicines you take and that have been prescribed to you. Take any new Medicines after you have completely understood and accpet all the possible adverse reactions/side effects.   Do not drive, operating heavy machinery, perform activities at heights, swimming or participation in water activities or provide baby sitting services if your were admitted for syncope or siezures until you have seen by Primary MD or a Neurologist and advised to do so again.  Do not drive when taking Pain medications.    Do not take more than prescribed Pain, Sleep and Anxiety Medications  Special Instructions: If you have smoked or chewed Tobacco  in the last 2 yrs please stop smoking, stop any regular Alcohol  and or any Recreational drug use.  Wear Seat belts while driving.   Please note  You were cared for by a hospitalist during your hospital stay. If you have any questions about your discharge medications or the care you received while you were in the hospital after you  are discharged, you can call the unit and asked to speak with the hospitalist on call if the hospitalist that took care of you is not available. Once you are discharged, your primary care physician will handle any further medical issues. Please note that NO REFILLS for any discharge medications will be authorized once you are discharged, as it is imperative that you return to your primary care physician (or establish a relationship with a primary care physician if you do not have one) for your aftercare needs so that they can reassess your need for medications and monitor your lab values.

## 2023-06-03 NOTE — Plan of Care (Signed)

## 2023-06-03 NOTE — Discharge Summary (Signed)
 Physician Discharge Summary  [name]:[id number] DOB: Mar 01, 1985 DOA: 06/01/2023  PCP: Storm Frisk, MD  Admit date: 06/01/2023 Discharge date: 06/03/2023  Admitted From: (Home) Disposition:  (Home)  Recommendations for Outpatient Follow-up:  Follow up with PCP in 1-2 weeks Please obtain BMP/CBC in one week   Diet recommendation: Heart Healthy / Carb Modified   Brief/Interim Summary:  [name]  is a 38 y.o. male, with medical history significant of asthma, anxiety/depression, HIV, type 1 diabetes, CKD stage IIIa, hypertension, hyperlipidemia, marijuana abuse.  With previous admission in the past due to uncontrolled diabetes with either hypoglycemia or hyperglycemia.   -Presents to ED secondary to complaints of nausea, vomiting and hyperglycemia, patient was in ED yesterday for complaints of nausea, vomiting and abdominal pain, CT abdomen pelvis with no acute finding, UA was negative, while he was discharged home, he presents again today with significant nausea and vomiting and hyperglycemia, denies any chest pain, shortness of breath, urinary symptoms.. -In ED he was found to be in DKA, with temperature of 100, white blood cell count of 12, potassium of 5.4, creatinine up to 2.4, with blood glucose of 439 with anion gap of 17, started on insulin drip, patient was admitted for further management   Diabetes mellitus, type I with DKA -Presents with DKA, bicarb low at 18, anion gap elevated at 17. -Admitted under DKA protocol, treated with insulin drip, IV fluids, anion gap has closed, he was transitioned to subcu insulin regimen .  A1c is 9.4, he was started on Lantus 18 units, but sometimes he has poor appetite so he is discharged on Lantus 14 units, and sliding scale, refill has been given and dispensed from TOC.  Discharge     Asthma Stable, no active wheezing, not on inhalers at home,   HIV Continue with Genvoya   Hypertension - Patient on hydralazine,  Diovan/hydrochlorothiazide at home, his regimen has been adjusted, Diovan/hydrochlorothiazide has been held during hospital stay due to AKI on CKD, zine has been transitioned to Norvasc at this once daily to ensure better compliance, given his diabetic he was discharged on low-dose valsartan as well now his renal function back to baseline   AKI  on CKD stage IIIa - Due to volume depletion depletion and dehydration, possible contrast nephropathy as he received CT abdomen with IV contrast during ED visit 1 day prior to admission. - Function back to baseline on discharge. . hold nephrotoxic medication and continue with IV fluids -Will check UA, urine cultures and urine sodium received contrast couple days ago, rule out contrast nephropathy   Abd pain, nausea and vomiting -No clear etiology on CT scan prior to admission, most likely due to DKA -Appears within normal limit -Patient with fever of 100 on admission, UA is negative, procalcitonin reassuring at 0.19, blood culture has been negative during hospital stay, he was monitored off antibiotics    History of syphilis -During recent lab check in ID clinic, RPR titer 1:16, he has outpatient scheduled with ID on 06/12/2023, patient received treatment, and apparently his RPR titer trending down.     Discharge Diagnoses:  Active Problems:   Essential hypertension   CKD (chronic kidney disease) stage 2, GFR 60-89 ml/min   HIV infection (HCC)   MDD (major depressive disorder), recurrent episode, severe (HCC)   DKA (diabetic ketoacidosis) (HCC)    Discharge Instructions  Discharge Instructions     Diet - low sodium heart healthy   Complete by: As directed  Discharge instructions   Complete by: As directed    Follow with Primary MD Storm Frisk, MD in 7 days   Get CBC, CMP,checked  by Primary MD next visit.    Activity: As tolerated with Full fall precautions use walker/cane & assistance as needed   Disposition Home    Diet:  Heart Healthy /Carb modified  On your next visit with your primary care physician please Get Medicines reviewed and adjusted.   Please request your Prim.MD to go over all Hospital Tests and Procedure/Radiological results at the follow up, please get all Hospital records sent to your Prim MD by signing hospital release before you go home.   If you experience worsening of your admission symptoms, develop shortness of breath, life threatening emergency, suicidal or homicidal thoughts you must seek medical attention immediately by calling 911 or calling your MD immediately  if symptoms less severe.  You Must read complete instructions/literature along with all the possible adverse reactions/side effects for all the Medicines you take and that have been prescribed to you. Take any new Medicines after you have completely understood and accpet all the possible adverse reactions/side effects.   Do not drive, operating heavy machinery, perform activities at heights, swimming or participation in water activities or provide baby sitting services if your were admitted for syncope or siezures until you have seen by Primary MD or a Neurologist and advised to do so again.  Do not drive when taking Pain medications.    Do not take more than prescribed Pain, Sleep and Anxiety Medications  Special Instructions: If you have smoked or chewed Tobacco  in the last 2 yrs please stop smoking, stop any regular Alcohol  and or any Recreational drug use.  Wear Seat belts while driving.   Please note  You were cared for by a hospitalist during your hospital stay. If you have any questions about your discharge medications or the care you received while you were in the hospital after you are discharged, you can call the unit and asked to speak with the hospitalist on call if the hospitalist that took care of you is not available. Once you are discharged, your primary care physician will handle any further medical  issues. Please note that NO REFILLS for any discharge medications will be authorized once you are discharged, as it is imperative that you return to your primary care physician (or establish a relationship with a primary care physician if you do not have one) for your aftercare needs so that they can reassess your need for medications and monitor your lab values.   Increase activity slowly   Complete by: As directed       Allergies as of 06/03/2023       Reactions   Shellfish Allergy Anaphylaxis, Hives   Aspirin Other (See Comments)   Lump in armpit        Medication List     STOP taking these medications    hydrALAZINE 25 MG tablet Commonly known as: APRESOLINE   ibuprofen 200 MG tablet Commonly known as: ADVIL   valsartan-hydrochlorothiazide 320-25 MG tablet Commonly known as: DIOVAN-HCT       TAKE these medications    acetaminophen 500 MG tablet Commonly known as: TYLENOL Take 2 tablets (1,000 mg total) by mouth every 8 (eight) hours as needed for moderate pain (pain score 4-6), fever, mild pain (pain score 1-3) or headache.   amLODipine 5 MG tablet Commonly known as: NORVASC Take 1 tablet (5  mg total) by mouth daily.   Blood Glucose Monitor System w/Device Kit Use as directed to check blood sugar three times per day.   BLOOD GLUCOSE TEST STRIPS Strp Use as directed 3 times daily to check blood sugar   Genvoya 150-150-200-10 MG Tabs tablet Generic drug: elvitegravir-cobicistat-emtricitabine-tenofovir Take 1 tablet by mouth daily with breakfast.   insulin glargine 100 UNIT/ML Solostar Pen Commonly known as: LANTUS Inject 14 Units into the skin daily What changed: how much to take   insulin lispro 100 UNIT/ML KwikPen Commonly known as: HUMALOG Inject 6 Units into the skin with breakfast, with lunch, and with evening meal. For blood sugars 0-150 give 0 units of insulin, 151-200 give 2 units of insulin, 201-250 give 4 units, 251-300 give 6 units, 301-350 give  8 units, 351-400 give 10 units,> 400 give 12 units What changed: how much to take   Insulin Pen Needle 31G X 8 MM Misc Use as directed three times daily.   Lancet Device Misc Use as directed three times daily.   Lancets Misc Use 3 (three) times daily to check blood sugar as directed.   mirtazapine 15 MG tablet Commonly known as: Remeron Take 0.5 tablets (7.5 mg total) by mouth at bedtime. What changed: when to take this   ondansetron 4 MG tablet Commonly known as: ZOFRAN Take 1 tablet (4 mg total) by mouth every 8 (eight) hours as needed for nausea or vomiting.   sertraline 100 MG tablet Commonly known as: ZOLOFT Take 1.5 tablets (150 mg total) by mouth daily.   TIGER BALM MUSCLE RUB EX Apply 1 Application topically as needed.   valsartan 40 MG tablet Commonly known as: Diovan Take 1 tablet (40 mg total) by mouth daily.        Allergies  Allergen Reactions   Shellfish Allergy Anaphylaxis and Hives   Aspirin Other (See Comments)    Lump in armpit    Consultations: none   Procedures/Studies: DG Chest Port 1 View Result Date: 06/01/2023 CLINICAL DATA:  Chest pain, vomiting and diarrhea. EXAM: PORTABLE CHEST 1 VIEW COMPARISON:  PA and lateral chest 11/29/2022 FINDINGS: The heart size and mediastinal contours are within normal limits. Both lungs are clear. The visualized skeletal structures are unremarkable. IMPRESSION: No active disease.  Unchanged. Electronically Signed   By: Almira Bar M.D.   On: 06/01/2023 05:16   CT ABDOMEN PELVIS W CONTRAST Result Date: 05/31/2023 CLINICAL DATA:  Abdominal pain, acute, nonlocalized EXAM: CT ABDOMEN AND PELVIS WITH CONTRAST TECHNIQUE: Multidetector CT imaging of the abdomen and pelvis was performed using the standard protocol following bolus administration of intravenous contrast. RADIATION DOSE REDUCTION: This exam was performed according to the departmental dose-optimization program which includes automated exposure control,  adjustment of the mA and/or kV according to patient size and/or use of iterative reconstruction technique. CONTRAST:  75mL OMNIPAQUE IOHEXOL 350 MG/ML SOLN COMPARISON:  CT abdomen pelvis 06/26/2022 FINDINGS: Lower chest: No acute abnormality.  Prominent heart size. Hepatobiliary: No focal liver abnormality. No gallstones, gallbladder wall thickening, or pericholecystic fluid. No biliary dilatation. Pancreas: No focal lesion. Normal pancreatic contour. No surrounding inflammatory changes. No main pancreatic ductal dilatation. Spleen: Normal in size without focal abnormality. Adrenals/Urinary Tract: No adrenal nodule bilaterally. Bilateral kidneys enhance symmetrically. No hydronephrosis. No hydroureter. The urinary bladder is unremarkable. Stomach/Bowel: Stomach is within normal limits. No evidence of bowel wall thickening or dilatation. Appendix appears normal. Vascular/Lymphatic: No abdominal aorta or iliac aneurysm. No abdominal, pelvic, or inguinal lymphadenopathy. Reproductive: Question enlarged  difficult to measure heterogeneous prostate. Other: No intraperitoneal free fluid. No intraperitoneal free gas. No organized fluid collection. Musculoskeletal: Similar-appearing small fat containing umbilical hernia. No suspicious lytic or blastic osseous lesions. No acute displaced fracture. Multilevel degenerative changes of the spine. Marked Schmorl node formation along the L3-L4 levels. IMPRESSION: 1. Prominent heart size. 2. Question enlarged difficult to measure heterogeneous prostate. Consider correlation with physical exam. 3. Small fat containing umbilical hernia. Electronically Signed   By: Tish Frederickson M.D.   On: 05/31/2023 20:03     Subjective:  No significant events overnight Discharge Exam: Vitals:   06/03/23 0000 06/03/23 0453  BP: (!) 149/93 (!) 163/97  Pulse: 76 71  Resp: 16 13  Temp:  98.2 F (36.8 C)  SpO2: 96% 98%   Vitals:   06/02/23 2036 06/02/23 2339 06/03/23 0000 06/03/23 0453   BP: (!) 164/114 (!) 165/98 (!) 149/93 (!) 163/97  Pulse: 90 93 76 71  Resp: 20 19 16 13   Temp: 98.6 F (37 C) 98.4 F (36.9 C)  98.2 F (36.8 C)  TempSrc: Oral Oral  Oral  SpO2: 95% 95% 96% 98%    General: Pt is alert, awake, not in acute distress Cardiovascular: RRR, S1/S2 +, no rubs, no gallops Respiratory: CTA bilaterally, no wheezing, no rhonchi Abdominal: Soft, NT, ND, bowel sounds + Extremities: no edema, no cyanosis    The results of significant diagnostics from this hospitalization (including imaging, microbiology, ancillary and laboratory) are listed below for reference.     Microbiology: Recent Results (from the past 240 hours)  CT/NG RNA, TMA Rectal     Status: None   Collection Time: 05/29/23 10:48 AM   Specimen: Sterile Swab  Result Value Ref Range Status   Chlamydia Trachomatis RNA NOT DETECTED NOT DETECTED Final   Neisseria Gonorrhoeae RNA NOT DETECTED NOT DETECTED Final    Comment: The analytical performance characteristics of this assay have been determined by [employer]. The modifications have not been cleared or approved by the FDA. This assay has been validated pursuant to the CLIA regulations and is used for clinical purposes. Salena Saner trachomatis/N. gonorrhoeae RNA     Status: None   Collection Time: 05/29/23 10:48 AM   Specimen: Urine  Result Value Ref Range Status   C. trachomatis RNA, TMA NOT DETECTED NOT DETECTED Final   N. gonorrhoeae RNA, TMA NOT DETECTED NOT DETECTED Final    Comment: The analytical performance characteristics of this assay, when used to test SurePath(TM) specimens have been determined by [employer]. The modifications have not been cleared or approved by the FDA. This assay has been validated pursuant to the CLIA regulations and is used for clinical purposes. . For additional information, please refer to https://education.questdiagnostics.com/faq/FAQ154 (This link is being provided for  information/ educational purposes only.) .   GC/CT Probe, Amp (Throat)     Status: None   Collection Time: 05/29/23 10:48 AM   Specimen: Sterile Swab  Result Value Ref Range Status   Chlamydia trachomatis RNA NOT DETECTED NOT DETECTED Final   Neisseria gonorrhoeae RNA NOT DETECTED NOT DETECTED Final    Comment: The analytical performance characteristics of this assay have been determined by [employer]. The modifications have not been cleared or approved by the FDA. This assay has been validated pursuant to the CLIA regulations and is used for clinical purposes. Marland Kitchen   Resp panel by RT-PCR (RSV, Flu A&B, Covid) Anterior Nasal Swab     Status: None   Collection  Time: 06/01/23  5:27 AM   Specimen: Anterior Nasal Swab  Result Value Ref Range Status   SARS Coronavirus 2 by RT PCR NEGATIVE NEGATIVE Final   Influenza A by PCR NEGATIVE NEGATIVE Final   Influenza B by PCR NEGATIVE NEGATIVE Final    Comment: (NOTE) The Xpert Xpress SARS-CoV-2/FLU/RSV plus assay is intended as an aid in the diagnosis of influenza from Nasopharyngeal swab specimens and should not be used as a sole basis for treatment. Nasal washings and aspirates are unacceptable for Xpert Xpress SARS-CoV-2/FLU/RSV testing.  Fact Sheet for Patients: BloggerCourse.com  Fact Sheet for Healthcare Providers: SeriousBroker.it  This test is not yet approved or cleared by the [place] FDA and has been authorized for detection and/or diagnosis of SARS-CoV-2 by FDA under an Emergency Use Authorization (EUA). This EUA will remain in effect (meaning this test can be used) for the duration of the COVID-19 declaration under Section 564(b)(1) of the Act, 21 U.S.C. section 360bbb-3(b)(1), unless the authorization is terminated or revoked.     Resp Syncytial Virus by PCR NEGATIVE NEGATIVE Final    Comment: (NOTE) Fact Sheet for  Patients: BloggerCourse.com  Fact Sheet for Healthcare Providers: SeriousBroker.it  This test is not yet approved or cleared by the [place] FDA and has been authorized for detection and/or diagnosis of SARS-CoV-2 by FDA under an Emergency Use Authorization (EUA). This EUA will remain in effect (meaning this test can be used) for the duration of the COVID-19 declaration under Section 564(b)(1) of the Act, 21 U.S.C. section 360bbb-3(b)(1), unless the authorization is terminated or revoked.  Performed at [facility], [address] [place], [place] [address]   Culture, blood (Routine X 2) w Reflex to ID Panel     Status: None (Preliminary result)   Collection Time: 06/01/23 12:29 PM   Specimen: BLOOD LEFT HAND  Result Value Ref Range Status   Specimen Description BLOOD LEFT HAND  Final   Special Requests   Final    BOTTLES DRAWN AEROBIC AND ANAEROBIC Blood Culture adequate volume   Culture   Final    NO GROWTH 2 DAYS Performed at [facility], [address] [place], [place] [address]    Report Status PENDING  Incomplete  Culture, blood (Routine X 2) w Reflex to ID Panel     Status: None (Preliminary result)   Collection Time: 06/01/23 12:29 PM   Specimen: BLOOD LEFT HAND  Result Value Ref Range Status   Specimen Description BLOOD LEFT HAND  Final   Special Requests   Final    BOTTLES DRAWN AEROBIC AND ANAEROBIC Blood Culture adequate volume   Culture   Final    NO GROWTH 2 DAYS Performed at [facility], [address] [place], [place] [address]    Report Status PENDING  Incomplete     Labs: BNP (last 3 results) No results for input(s): "BNP" in the last 8760 hours. Basic Metabolic Panel: Recent Labs  Lab 05/31/23 1427 06/01/23 0527 06/01/23 0715 06/01/23 1229 06/01/23 2326 06/03/23 0603  NA 142 134* 132* 138 134* 138  K 5.0 5.2* 5.4* 4.2 4.3 4.1  CL 106 99  --  106 104 106  CO2 26 18*   --  20* 22 24  GLUCOSE 109* 439*  --  124* 230* 109*  BUN 19 36*  --  41* 42* 24*  CREATININE 1.54* 2.47*  --  2.61* 2.49* 1.94*  CALCIUM 9.8 9.3  --  8.6*  8.6* 9.1  MG  --   --   --   --   --  1.7  PHOS  --   --   --   --   --  3.9   Liver Function Tests: Recent Labs  Lab 05/31/23 1427  AST 26  ALT 17  ALKPHOS 53  BILITOT 0.6  PROT 7.0  ALBUMIN 3.8   Recent Labs  Lab 05/31/23 1427  LIPASE 23   No results for input(s): "AMMONIA" in the last 168 hours. CBC: Recent Labs  Lab 05/31/23 1427 06/01/23 0527 06/01/23 0715 06/01/23 1230 06/03/23 0603  WBC 9.7 12.1*  --  13.2* 7.4  NEUTROABS 8.5*  --   --   --   --   HGB 14.2 14.2 14.6 13.1 13.4  HCT 41.7 42.3 43.0 38.6* 38.6*  MCV 90.7 91.4  --  91.3 88.5  PLT 271 264  --  259 231   Cardiac Enzymes: No results for input(s): "CKTOTAL", "CKMB", "CKMBINDEX", "TROPONINI" in the last 168 hours. BNP: Invalid input(s): "POCBNP" CBG: Recent Labs  Lab 06/02/23 1225 06/02/23 1628 06/02/23 2131 06/02/23 2201 06/03/23 0818  GLUCAP 253* 83 69* 113* 97   D-Dimer No results for input(s): "DDIMER" in the last 72 hours. Hgb A1c Recent Labs    06/01/23 1229  HGBA1C 9.4*   Lipid Profile No results for input(s): "CHOL", "HDL", "LDLCALC", "TRIG", "CHOLHDL", "LDLDIRECT" in the last 72 hours. Thyroid function studies No results for input(s): "TSH", "T4TOTAL", "T3FREE", "THYROIDAB" in the last 72 hours.  Invalid input(s): "FREET3" Anemia work up No results for input(s): "VITAMINB12", "FOLATE", "FERRITIN", "TIBC", "IRON", "RETICCTPCT" in the last 72 hours. Urinalysis    Component Value Date/Time   COLORURINE YELLOW 05/31/2023 1417   APPEARANCEUR CLEAR 05/31/2023 1417   LABSPEC 1.022 05/31/2023 1417   PHURINE 7.0 05/31/2023 1417   GLUCOSEU NEGATIVE 05/31/2023 1417   HGBUR NEGATIVE 05/31/2023 1417   BILIRUBINUR NEGATIVE 05/31/2023 1417   KETONESUR 20 (A) 05/31/2023 1417   PROTEINUR >=300 (A) 05/31/2023 1417    UROBILINOGEN 0.2 02/10/2015 1416   NITRITE NEGATIVE 05/31/2023 1417   LEUKOCYTESUR NEGATIVE 05/31/2023 1417   Sepsis Labs Recent Labs  Lab 05/31/23 1427 06/01/23 0527 06/01/23 1230 06/03/23 0603  WBC 9.7 12.1* 13.2* 7.4   Microbiology Recent Results (from the past 240 hours)  CT/NG RNA, TMA Rectal     Status: None   Collection Time: 05/29/23 10:48 AM   Specimen: Sterile Swab  Result Value Ref Range Status   Chlamydia Trachomatis RNA NOT DETECTED NOT DETECTED Final   Neisseria Gonorrhoeae RNA NOT DETECTED NOT DETECTED Final    Comment: The analytical performance characteristics of this assay have been determined by [employer]. The modifications have not been cleared or approved by the FDA. This assay has been validated pursuant to the CLIA regulations and is used for clinical purposes. Salena Saner trachomatis/N. gonorrhoeae RNA     Status: None   Collection Time: 05/29/23 10:48 AM   Specimen: Urine  Result Value Ref Range Status   C. trachomatis RNA, TMA NOT DETECTED NOT DETECTED Final   N. gonorrhoeae RNA, TMA NOT DETECTED NOT DETECTED Final    Comment: The analytical performance characteristics of this assay, when used to test SurePath(TM) specimens have been determined by [employer]. The modifications have not been cleared or approved by the FDA. This assay has been validated pursuant to the CLIA regulations and is used for clinical purposes. . For additional information, please  refer to https://education.questdiagnostics.com/faq/FAQ154 (This link is being provided for information/ educational purposes only.) .   GC/CT Probe, Amp (Throat)     Status: None   Collection Time: 05/29/23 10:48 AM   Specimen: Sterile Swab  Result Value Ref Range Status   Chlamydia trachomatis RNA NOT DETECTED NOT DETECTED Final   Neisseria gonorrhoeae RNA NOT DETECTED NOT DETECTED Final    Comment: The analytical performance characteristics of this assay have been determined  by [employer]. The modifications have not been cleared or approved by the FDA. This assay has been validated pursuant to the CLIA regulations and is used for clinical purposes. Marland Kitchen   Resp panel by RT-PCR (RSV, Flu A&B, Covid) Anterior Nasal Swab     Status: None   Collection Time: 06/01/23  5:27 AM   Specimen: Anterior Nasal Swab  Result Value Ref Range Status   SARS Coronavirus 2 by RT PCR NEGATIVE NEGATIVE Final   Influenza A by PCR NEGATIVE NEGATIVE Final   Influenza B by PCR NEGATIVE NEGATIVE Final    Comment: (NOTE) The Xpert Xpress SARS-CoV-2/FLU/RSV plus assay is intended as an aid in the diagnosis of influenza from Nasopharyngeal swab specimens and should not be used as a sole basis for treatment. Nasal washings and aspirates are unacceptable for Xpert Xpress SARS-CoV-2/FLU/RSV testing.  Fact Sheet for Patients: BloggerCourse.com  Fact Sheet for Healthcare Providers: SeriousBroker.it  This test is not yet approved or cleared by the [place] FDA and has been authorized for detection and/or diagnosis of SARS-CoV-2 by FDA under an Emergency Use Authorization (EUA). This EUA will remain in effect (meaning this test can be used) for the duration of the COVID-19 declaration under Section 564(b)(1) of the Act, 21 U.S.C. section 360bbb-3(b)(1), unless the authorization is terminated or revoked.     Resp Syncytial Virus by PCR NEGATIVE NEGATIVE Final    Comment: (NOTE) Fact Sheet for Patients: BloggerCourse.com  Fact Sheet for Healthcare Providers: SeriousBroker.it  This test is not yet approved or cleared by the [place] FDA and has been authorized for detection and/or diagnosis of SARS-CoV-2 by FDA under an Emergency Use Authorization (EUA). This EUA will remain in effect (meaning this test can be used) for the duration of the COVID-19 declaration under  Section 564(b)(1) of the Act, 21 U.S.C. section 360bbb-3(b)(1), unless the authorization is terminated or revoked.  Performed at [facility], [name] [place], [place] [address]   Culture, blood (Routine X 2) w Reflex to ID Panel     Status: None (Preliminary result)   Collection Time: 06/01/23 12:29 PM   Specimen: BLOOD LEFT HAND  Result Value Ref Range Status   Specimen Description BLOOD LEFT HAND  Final   Special Requests   Final    BOTTLES DRAWN AEROBIC AND ANAEROBIC Blood Culture adequate volume   Culture   Final    NO GROWTH 2 DAYS Performed at [facility], [address], [place], [place] [address]    Report Status PENDING  Incomplete  Culture, blood (Routine X 2) w Reflex to ID Panel     Status: None (Preliminary result)   Collection Time: 06/01/23 12:29 PM   Specimen: BLOOD LEFT HAND  Result Value Ref Range Status   Specimen Description BLOOD LEFT HAND  Final   Special Requests   Final    BOTTLES DRAWN AEROBIC AND ANAEROBIC Blood Culture adequate volume   Culture   Final    NO GROWTH 2 DAYS Performed at  [facility], [address] [place], [place] [address]    Report Status PENDING  Incomplete     Time coordinating discharge: Over 30 minutes  SIGNED:   Huey Bienenstock, MD  [facility] 06/03/2023, 10:54 AM Pager   If 7PM-7AM, please contact night-coverage [email or web address] Password [email or web address]

## 2023-06-03 NOTE — Plan of Care (Signed)
  Problem: Education: Goal: Ability to describe self-care measures that may prevent or decrease complications (Diabetes Survival Skills Education) will improve Outcome: Completed/Met Goal: Individualized Educational Video(s) Outcome: Completed/Met   Problem: Coping: Goal: Ability to adjust to condition or change in health will improve Outcome: Completed/Met   Problem: Fluid Volume: Goal: Ability to maintain a balanced intake and output will improve Outcome: Completed/Met   Problem: Health Behavior/Discharge Planning: Goal: Ability to identify and utilize available resources and services will improve Outcome: Completed/Met Goal: Ability to manage health-related needs will improve Outcome: Completed/Met   Problem: Metabolic: Goal: Ability to maintain appropriate glucose levels will improve Outcome: Completed/Met   Problem: Nutritional: Goal: Maintenance of adequate nutrition will improve Outcome: Completed/Met Goal: Progress toward achieving an optimal weight will improve Outcome: Completed/Met   Problem: Skin Integrity: Goal: Risk for impaired skin integrity will decrease Outcome: Completed/Met   Problem: Tissue Perfusion: Goal: Adequacy of tissue perfusion will improve Outcome: Completed/Met   Problem: Education: Goal: Ability to describe self-care measures that may prevent or decrease complications (Diabetes Survival Skills Education) will improve Outcome: Completed/Met Goal: Individualized Educational Video(s) Outcome: Completed/Met   Problem: Cardiac: Goal: Ability to maintain an adequate cardiac output will improve Outcome: Completed/Met   Problem: Health Behavior/Discharge Planning: Goal: Ability to identify and utilize available resources and services will improve Outcome: Completed/Met Goal: Ability to manage health-related needs will improve Outcome: Completed/Met   Problem: Fluid Volume: Goal: Ability to achieve a balanced intake and output will  improve Outcome: Completed/Met   Problem: Metabolic: Goal: Ability to maintain appropriate glucose levels will improve Outcome: Completed/Met   Problem: Nutritional: Goal: Maintenance of adequate nutrition will improve Outcome: Completed/Met Goal: Maintenance of adequate weight for body size and type will improve Outcome: Completed/Met   Problem: Respiratory: Goal: Will regain and/or maintain adequate ventilation Outcome: Completed/Met   Problem: Urinary Elimination: Goal: Ability to achieve and maintain adequate renal perfusion and functioning will improve Outcome: Completed/Met   Problem: Education: Goal: Knowledge of General Education information will improve Description: Including pain rating scale, medication(s)/side effects and non-pharmacologic comfort measures Outcome: Completed/Met   Problem: Health Behavior/Discharge Planning: Goal: Ability to manage health-related needs will improve Outcome: Completed/Met   Problem: Clinical Measurements: Goal: Ability to maintain clinical measurements within normal limits will improve Outcome: Completed/Met Goal: Will remain free from infection Outcome: Completed/Met Goal: Diagnostic test results will improve Outcome: Completed/Met Goal: Respiratory complications will improve Outcome: Completed/Met Goal: Cardiovascular complication will be avoided Outcome: Completed/Met   Problem: Activity: Goal: Risk for activity intolerance will decrease Outcome: Completed/Met   Problem: Nutrition: Goal: Adequate nutrition will be maintained Outcome: Completed/Met   Problem: Coping: Goal: Level of anxiety will decrease Outcome: Completed/Met   Problem: Elimination: Goal: Will not experience complications related to bowel motility Outcome: Completed/Met Goal: Will not experience complications related to urinary retention Outcome: Completed/Met   Problem: Pain Managment: Goal: General experience of comfort will improve and/or  be controlled Outcome: Completed/Met   Problem: Safety: Goal: Ability to remain free from injury will improve Outcome: Completed/Met   Problem: Skin Integrity: Goal: Risk for impaired skin integrity will decrease Outcome: Completed/Met

## 2023-06-05 ENCOUNTER — Telehealth: Payer: Self-pay

## 2023-06-05 NOTE — Transitions of Care (Post Inpatient/ED Visit) (Signed)
 06/05/2023  Name: [name] MRN: [id number] DOB: 1985/11/04  Today's TOC FU Call Status: Today's TOC FU Call Status:: Successful TOC FU Call Completed TOC FU Call Complete Date: 06/05/23 Patient's Name and Date of Birth confirmed.  Transition Care Management Follow-up Telephone Call Date of Discharge: 06/03/23 Discharge Facility: [name]) Type of Discharge: Inpatient Admission Primary Inpatient Discharge Diagnosis:: Diabetes mellitus, type I with DKA How have you been since you were released from the hospital?: Better Any questions or concerns?: No  Items Reviewed: Did you receive and understand the discharge instructions provided?: Yes Medications obtained,verified, and reconciled?: Yes (Medications Reviewed) (Medication reconciliation completed based on recent discharge summary Patient taking medications as instructed and is aware of any changes or dosage adjustments medication regimen. Patient denies questions and reports no barriers to medication adherence) Any new allergies since your discharge?: No Dietary orders reviewed?: Yes Type of Diet Ordered:: Reg Heart Healthy Carb Modified Do you have support at home?: Yes People in Home [RPT]: alone Name of Support/Comfort Primary Source: Mother Marvis Repress  Medications Reviewed Today: Medications Reviewed Today     Reviewed by Johnnette Barrios, RN (Registered Nurse) on 06/05/23 at 1322  Med List Status: <None>   Medication Order Taking? Sig Documenting Provider Last Dose Status Informant  acetaminophen (TYLENOL) 500 MG tablet [id number] Yes Take 2 tablets (1,000 mg total) by mouth every 8 (eight) hours as needed for moderate pain (pain score 4-6), fever, mild pain (pain score 1-3) or headache. Fayrene Helper, PA-C Taking Active   amLODipine (NORVASC) 5 MG tablet [id number] Yes Take 1 tablet (5 mg total) by mouth daily. Elgergawy, Leana Roe, MD Taking Active   Blood Glucose Monitoring Suppl (BLOOD GLUCOSE  MONITOR SYSTEM) w/Device KIT [id number] Yes Use as directed to check blood sugar three times per day. Elgergawy, Leana Roe, MD Taking Active            Med Note (Carla Whilden L   Mon Jun 05, 2023  1:20 PM) He has done before This  is a new machine   Camphor-Menthol-Methyl Sal Annalee Genta BALM MUSCLE RUB EX) [id number] Yes Apply 1 Application topically as needed. [provider] Taking Active Self, Pharmacy Records  elvitegravir-cobicistat-emtricitabine-tenofovir (GENVOYA) 150-150-200-10 MG TABS tablet [id number] Yes Take 1 tablet by mouth daily with breakfast. Veryl Speak, FNP Taking Active Self, Pharmacy Records  Glucose Blood (BLOOD GLUCOSE TEST STRIPS) STRP [id number] Yes Use as directed 3 times daily to check blood sugar Elgergawy, Leana Roe, MD Taking Active            Med Note (Susa Bones L   Mon Jun 05, 2023  1:21 PM)  New machine with supplies picking up today   insulin glargine (LANTUS) 100 UNIT/ML Solostar Pen [id number] Yes Inject 14 Units into the skin daily Elgergawy, Leana Roe, MD Taking Active   insulin lispro (HUMALOG) 100 UNIT/ML KwikPen [id number] Yes Inject 6 Units into the skin with breakfast, with lunch, and with evening meal. For blood sugars 0-150 give 0 units of insulin, 151-200 give 2 units of insulin, 201-250 give 4 units, 251-300 give 6 units, 301-350 give 8 units, 351-400 give 10 units,> 400 give 12 units Elgergawy, Leana Roe, MD Taking Active   Insulin Pen Needle 31G X 8 MM MISC [id number] Yes Use as directed three times daily. Elgergawy, Leana Roe, MD Taking Active   Lancet Device MISC [id number] Yes Use as directed three times daily. Elgergawy, Leana Roe, MD Taking Active   Lancets  MISC [id number] Yes Use 3 (three) times daily to check blood sugar as directed. Elgergawy, Leana Roe, MD Taking Active            Med Note (Apostolos Blagg L   Mon Jun 05, 2023  1:21 PM) Duplicate  mirtazapine (REMERON) 15 MG tablet [id number]  Take 0.5 tablets (7.5 mg total) by mouth at bedtime.   Patient taking differently: Take 7.5 mg by mouth 3 (three) times a week.   Hughie Closs, MD  Expired 06/01/23 2359 Self, Pharmacy Records           Med Note (Georgenia Salim L   Mon Jun 05, 2023  1:21 PM) Will need refill  ondansetron (ZOFRAN) 4 MG tablet [id number] Yes Take 1 tablet (4 mg total) by mouth every 8 (eight) hours as needed for nausea or vomiting. Fayrene Helper, PA-C Taking Active    Patient not taking:   Discontinued 07/08/19 1433 sertraline (ZOLOFT) 100 MG tablet [id number] Yes Take 1.5 tablets (150 mg total) by mouth daily. Claiborne Rigg, NP Taking Active Self, Pharmacy Records  valsartan (DIOVAN) 40 MG tablet [id number] Yes Take 1 tablet (40 mg total) by mouth daily. Elgergawy, Leana Roe, MD Taking Active            Reviewed the following updates with the patient  START taking: amLODipine (NORVASC) Blood Glucose Monitor System BLOOD GLUCOSE TEST STRIPS Insulin Pen Needle Lancet Device Lancets valsartan (Diovan) CHANGE how you take: insulin glargine (LANTUS) STOP taking: hydrALAZINE 25 MG tablet (APRESOLINE) ibuprofen 200 MG tablet (ADVIL) valsartan-hydrochlorothiazide 320-25 MG tablet (DIOVAN-HCT)  Medication reconciliation / review completed based on most recent discharge summary and EHR medication list. Confirmed patient is aware of  newly prescribed medications  (any discrepancies are noted in review section)   Patient  is aware of any changes to and / or  any dosage adjustments to medication regimen. Patient denies questions at this time and reports no barriers to medication adherence.  He is calling and [facility]    Home Care and Equipment/Supplies: Were Home Health Services Ordered?: No Any new equipment or medical supplies ordered?: No  Functional Questionnaire: Do you need assistance with bathing/showering or dressing?: No Do you need assistance with meal preparation?: No Do you need  assistance with eating?: No Do you have difficulty maintaining continence: No Do you need assistance with getting out of bed/getting out of a chair/moving?: No Do you have difficulty managing or taking your medications?: No  Follow up appointments reviewed: PCP Follow-up appointment confirmed?: Yes Date of PCP follow-up appointment?: 06/26/23 Follow-up Provider: Bertram Denver MD Specialist Hospital Follow-up appointment confirmed?: Yes Date of Specialist follow-up appointment?: 07/12/23 Follow-Up Specialty Provider:: ID Do you need transportation to your follow-up appointment?: No Do you understand care options if your condition(s) worsen?: Yes-patient verbalized understanding  SDOH Interventions Today    Flowsheet Row Most Recent Value  SDOH Interventions   Food Insecurity Interventions Intervention Not Indicated  Housing Interventions Community Resources Provided, Other (Comment)  [He has [employer] and will call to speak with his CM]  Transportation Interventions Patient Resources (Friends/Family), Payor Benefit  [[employer] Tailored Plan]  Utilities Interventions Other (Comment)  [He will be followed by [facility] CM]        Benefits reviewed  Based on current information and Insurance plan -Reviewed benefits accessible to patient, including details about eligibility options for care and  available value based care options  if any areas of needs  were identified.  Reviewed patient/  caregiver's ability to access and / or  ability with navigating the benefits system..Amb Referral made if indicted , refer to orders section of note for details   Reviewed goals for care  Patient / Caregiver was encouraged to make informed decisions about their care, actively participate in managing their health condition, and implement lifestyle changes as needed to promote independence and self-management of health care. There were no reported  barriers to care.   TOC program  Patient is at  risk for  readmission and / or has history of  high utilization  Discussed [facility]BCI  TOC program and weekly calls to patient to assess condition/status, medication management  and provide support/education as indicated . Patient  and / or Caregive voiced understanding and declined enrollment in the 30-day TOC Program at this time . He is enrolled with [employer] and will call to notify them of his reent hospitalization and speak with his Case Manager    Follow-up Plan Scheduled a post hospital Follow up appointment  with  PCP within in 1-2 weeks of your hospital discharge date Scheduled with care guides earliest 06/26/23 @ 2:50pm with Bertram Denver , MD Discharge instructions Follow with Primary MD Storm Frisk, MD in 7 days- Patient was told Dr Delford Field is not practicing He needed to schedule with alternative Provider  Get CBC, CMP,checked by Primary MD next visit. -He was encouraged to Take his  medications and your discharge paperwork with you for your next visit with your Primary MD-   Be sure to request your Primary MD to go over all hospital tests and procedure/radiological results at the follow up appointment ,   2.  Patient was encouraged to Contact PCP with any questions or concerns regarding ongoing medical care, any difficulty obtaining or picking up prescriptions, any changes or worsening in condition including signs / symptoms not relieved  with interventions Patient had no additional questions or concerns at this time.   3. Scheduled Office Visit with Jeanine Luz Monday Jun 12, 2023 11:00 AM [facility] [address] [place], [address] [place] [address] [phone]  4. Pick up new Prescriptions for equipment and supplies after transferring to Pharmacy of his choice   5. Notify [employer] of recent hospital stay and new recommendations    The patient has been provided with contact information for the care management team and  has been advised to call with any health-related questions or concerns. Follow up with their  PCP, Specialists or  additional Healthcare Providers as scheduled,  sooner if concerns arise    Susa Loffler , BSN, RN [facility]   VBCI-Population Health RN Care Manager Direct Dial [phone]  Fax: [phone] Website: [email or web address]

## 2023-06-05 NOTE — Progress Notes (Signed)
 FYI - I cannot get in touch with him. He was recently hospitalized with DKA but they did not treat him. He sees you Monday the 14th!

## 2023-06-06 LAB — CULTURE, BLOOD (ROUTINE X 2)
Culture: NO GROWTH
Culture: NO GROWTH
Special Requests: ADEQUATE
Special Requests: ADEQUATE

## 2023-06-12 ENCOUNTER — Ambulatory Visit: Payer: MEDICAID | Admitting: Family

## 2023-06-13 ENCOUNTER — Other Ambulatory Visit: Payer: Self-pay

## 2023-06-26 ENCOUNTER — Inpatient Hospital Stay: Payer: MEDICAID | Admitting: Nurse Practitioner

## 2023-08-07 ENCOUNTER — Telehealth: Payer: Self-pay

## 2023-08-07 NOTE — Telephone Encounter (Signed)
 Left patient a voice mail to call back to reschedule missed appointment with Gregory Calone.

## 2023-08-07 NOTE — Telephone Encounter (Signed)
-----   Message from [facility] sent at 08/04/2023  2:09 PM EDT ----- Regarding: Schedule pt Hi! Can you please reach out to patient and get him scheduled with Erla Haw as soon as he can?  Thank you! - Cassie

## 2023-08-28 ENCOUNTER — Other Ambulatory Visit: Payer: Self-pay

## 2023-08-28 NOTE — Telephone Encounter (Signed)
 Contacted pt to confirmed appt

## 2023-08-29 ENCOUNTER — Telehealth: Payer: Self-pay | Admitting: Pharmacist

## 2023-08-29 ENCOUNTER — Other Ambulatory Visit: Payer: Self-pay | Admitting: Pharmacist

## 2023-08-29 ENCOUNTER — Other Ambulatory Visit: Payer: Self-pay

## 2023-08-29 ENCOUNTER — Ambulatory Visit: Payer: MEDICAID | Attending: Family Medicine | Admitting: Family Medicine

## 2023-08-29 ENCOUNTER — Encounter: Payer: Self-pay | Admitting: Family Medicine

## 2023-08-29 ENCOUNTER — Ambulatory Visit: Payer: MEDICAID | Admitting: Family Medicine

## 2023-08-29 ENCOUNTER — Telehealth: Payer: Self-pay

## 2023-08-29 VITALS — BP 156/94 | HR 92 | Ht 72.0 in | Wt 195.0 lb

## 2023-08-29 DIAGNOSIS — F332 Major depressive disorder, recurrent severe without psychotic features: Secondary | ICD-10-CM

## 2023-08-29 DIAGNOSIS — Z5900 Homelessness unspecified: Secondary | ICD-10-CM

## 2023-08-29 DIAGNOSIS — N1832 Chronic kidney disease, stage 3b: Secondary | ICD-10-CM

## 2023-08-29 DIAGNOSIS — E1022 Type 1 diabetes mellitus with diabetic chronic kidney disease: Secondary | ICD-10-CM | POA: Diagnosis not present

## 2023-08-29 DIAGNOSIS — I129 Hypertensive chronic kidney disease with stage 1 through stage 4 chronic kidney disease, or unspecified chronic kidney disease: Secondary | ICD-10-CM

## 2023-08-29 DIAGNOSIS — Z794 Long term (current) use of insulin: Secondary | ICD-10-CM

## 2023-08-29 DIAGNOSIS — Z21 Asymptomatic human immunodeficiency virus [HIV] infection status: Secondary | ICD-10-CM

## 2023-08-29 DIAGNOSIS — G4709 Other insomnia: Secondary | ICD-10-CM

## 2023-08-29 MED ORDER — INSULIN GLARGINE 100 UNIT/ML SOLOSTAR PEN
22.0000 [IU] | PEN_INJECTOR | Freq: Every day | SUBCUTANEOUS | 2 refills | Status: DC
  Filled 2023-08-29: qty 30, 136d supply, fill #0
  Filled 2023-08-29: qty 18, 81d supply, fill #0

## 2023-08-29 MED ORDER — VALSARTAN 80 MG PO TABS
80.0000 mg | ORAL_TABLET | Freq: Every day | ORAL | 1 refills | Status: DC
  Filled 2023-08-29: qty 90, 90d supply, fill #0
  Filled 2023-12-21: qty 90, 90d supply, fill #1

## 2023-08-29 MED ORDER — SERTRALINE HCL 100 MG PO TABS
150.0000 mg | ORAL_TABLET | Freq: Every day | ORAL | 1 refills | Status: AC
  Filled 2023-12-21: qty 135, 90d supply, fill #0
  Filled 2024-03-27: qty 135, 90d supply, fill #1

## 2023-08-29 MED ORDER — INSULIN LISPRO (1 UNIT DIAL) 100 UNIT/ML (KWIKPEN)
0.0000 [IU] | PEN_INJECTOR | Freq: Three times a day (TID) | SUBCUTANEOUS | 2 refills | Status: AC
  Filled 2023-08-29: qty 30, 84d supply, fill #0
  Filled 2023-12-21 – 2024-02-26 (×3): qty 30, 84d supply, fill #1

## 2023-08-29 MED ORDER — FREESTYLE LIBRE 3 READER DEVI
0 refills | Status: DC
  Filled 2023-08-29: qty 1, 365d supply, fill #0

## 2023-08-29 MED ORDER — AMLODIPINE BESYLATE 5 MG PO TABS
5.0000 mg | ORAL_TABLET | Freq: Every day | ORAL | 1 refills | Status: DC
  Filled 2023-08-29: qty 90, 90d supply, fill #0
  Filled 2023-12-04: qty 90, 90d supply, fill #1

## 2023-08-29 MED ORDER — MIRTAZAPINE 15 MG PO TABS
7.5000 mg | ORAL_TABLET | Freq: Every day | ORAL | 1 refills | Status: AC
Start: 2023-08-29 — End: ?
  Filled 2023-08-29: qty 45, 90d supply, fill #0
  Filled 2023-12-04: qty 45, 90d supply, fill #1
  Filled 2024-02-26: qty 45, 90d supply, fill #2

## 2023-08-29 MED ORDER — FREESTYLE LIBRE 3 PLUS SENSOR MISC
6 refills | Status: DC
  Filled 2023-08-29: qty 2, 30d supply, fill #0
  Filled 2023-10-24: qty 2, 30d supply, fill #1

## 2023-08-29 NOTE — Patient Instructions (Signed)
 VISIT SUMMARY:  Today, you came in for medication refills and to discuss the management of your diabetes and blood pressure. We also reviewed your kidney function, knee pain, and sleep issues.  YOUR PLAN:  -TYPE 1 DIABETES MELLITUS WITH POOR GLYCEMIC CONTROL: Your blood sugar levels are not well controlled, which is likely due to your living conditions and lack of access to diabetes management tools. We will refer you to an endocrinologist for specialized diabetes care and consult a pharmacist to explore options for a continuous glucose monitor. Continue taking 22 units of Lantus  and using Humalog  on a sliding scale. It's important to monitor your blood sugar consistently and administer your insulin  as instructed. We will also work with a case Production designer, theatre/television/film to help with insurance and disability paperwork.  -HYPERTENSION: Your blood pressure is high, which can be worsened by stress and your living conditions. We are increasing your valsartan  dose to 80 mg daily. Please recheck your blood pressure in one month. We will also coordinate with a case manager to address factors affecting your health.  -CHRONIC KIDNEY DISEASE: Your kidney function is not normal, likely due to your diabetes and high blood pressure. We will refer you to a kidney specialist for further evaluation and management.  -DEPRESSION AND INSOMNIA: You are currently taking Zoloft  for depression and Remeron  for sleep issues. Continue taking these medications as prescribed.  -SOCIAL CHALLENGES IMPACTING HEALTH MANAGEMENT: Your lack of permanent housing is affecting your ability to manage your health. We will coordinate with a case manager to explore housing and social support resources.  INSTRUCTIONS:  Please follow up with the endocrinologist and nephrologist as soon as possible. Recheck your blood pressure in one month. Continue taking your medications as prescribed and monitor your blood sugar levels consistently. We will work with a case  Production designer, theatre/television/film to assist with insurance, disability paperwork, and housing resources.

## 2023-08-29 NOTE — Telephone Encounter (Signed)
 At the request of Dr Delbert, I met with the patient when he was in the clinic today. He is currently staying at [facility] and hopes to be able to secure permanent housing but has no consistent income to afford housing.   [name] He said he applied for disability on his own but did not submit the requested documentation by the deadline- 08/25/2023.  I told him that if he is in agreement I can check with [facility] to see if they can offer any assistance at this time.  His case ID: [id number].  He was in agreement to having me check and I told him that I would contact him after I heard back from them whether or not they an assist him and then I can place a referral if needed.   He has [employer] and is not aware of his Librarian, academic, so I told him that I would check on that too. The tailored care manager may have additional resources for shelter/ housing   He explained that he is trying to work part time but is having difficulty because his blood sugars are not well managed.  He said he works in [employer] in  [place] on  [address] and [address].  He said he just started last week but had an issue with his blood sugar dropping too low. He is not able to have regularly scheduled diabetic appropriate meals. I explained to him that [place], [facility], is working on getting him a CGM.  I also provided him with some food from the clinic food pantry.  He said he just has to eat where ever he can get a meal.  He spoke about having difficulty expressing how he feels and was agreeable to being referred to VBCI LCSW for possible counseling resources. He is trying to deal with multiple stressors related to housing, food, employment, managing his diabetes.   He is in agreement to being referred to endocrinology.  I also provided him with some bus passes and his sister met him at the end of his appointment.

## 2023-08-29 NOTE — Telephone Encounter (Signed)
 Patient holds a T1DM dx and now has [employer]. Are we able to submit a PA for the [place]?

## 2023-08-29 NOTE — Progress Notes (Signed)
 Subjective:  Patient ID: [name], male    DOB: May 15, 1985  Age: 38 y.o. MRN: [id number]  CC: Medical Management of Chronic Issues (Dry feet/Referral to eye doctor)     Discussed the use of AI scribe software for clinical note transcription with the patient, who gave verbal consent to proceed.  History of Present Illness [name] is a 38 year old male with type 1 diabetes, HIV  and hypertension who presents for medication refills and management of his diabetes and blood pressure.  Previously followed by Dr. Brien with his last visit in 12/2022  He struggles with managing blood sugar levels due to inconsistent access to meals and housing, with recent readings ranging from 62 to 300. He is on 22 units of Lantus  and uses Humalog  on a sliding scale. His last A1c was 9.4 in April. He has been unable to obtain an insulin  pump or continuous glucose monitor due to insurance issues.  He takes amlodipine  5 mg and 40 mg of valsartan  daily for hypertension, but blood pressure remains high, which he attributes to stress and living conditions. He took his blood pressure medication today.  He does have an  abnormal kidney function but is not currently seeing a kidney specialist.  He experiences right knee pain, worsened by walking, and takes ibuprofen  for relief. He also notes lymph nodes under his armpit.  He takes Zoloft  for depression and Remeron  7.5 mg at bedtime for sleep issues, experiencing difficulty sleeping without it.  He is diagnosed with HIV and is under the care of an infectious disease specialist.    Past Medical History:  Diagnosis Date   Anxiety    Asthma    Closed displaced comminuted fracture of shaft of right tibia 11/01/2017   Closed fracture of base of fifth metatarsal bone of right foot 11/13/2017   Closed fracture of medial malleolus of right ankle 10/31/2017   Depression    Headache    only w/stress (11/01/2017)   HIV (human  immunodeficiency virus infection) (HCC)    Hypoglycemia due to insulin  06/27/2022   Migraine    a few/month (11/01/2017)   Refusal of blood transfusions as patient is Jehovah's Witness    Seizure (HCC)    only w/my low blood sugars  (11/01/2017)   Type I diabetes mellitus (HCC)     Past Surgical History:  Procedure Laterality Date   FRACTURE SURGERY     IM NAILING TIBIA Right 11/01/2017   INTRAMEDULLARY (IM) NAIL TIBIALRightGeneral   ORIF ANKLE FRACTURE Right 11/01/2017   ORIF ANKLE FRACTURE Right 11/01/2017   Procedure: OPEN REDUCTION INTERNAL FIXATION (ORIF) ANKLE FRACTURE;  Surgeon: Kendal Franky SQUIBB, MD;  Location: [facility] OR;  Service: Orthopedics;  Laterality: Right;   TIBIA IM NAIL INSERTION Right 11/01/2017   Procedure: INTRAMEDULLARY (IM) NAIL TIBIAL;  Surgeon: Kendal Franky SQUIBB, MD;  Location: [facility] OR;  Service: Orthopedics;  Laterality: Right;    Family History  Problem Relation Age of Onset   Thyroid  disease Mother    Diabetes Mother    Breast cancer Maternal Grandmother    Diabetes Maternal Grandmother     Social History   Socioeconomic History   Marital status: Single    Spouse name: Not on file   Number of children: Not on file   Years of education: Not on file   Highest education level: Not on file  Occupational History   Not on file  Tobacco Use   Smoking status: Former  Types: Cigars    Start date: 09/29/2015    Quit date: 12/16/2022    Years since quitting: 0.7   Smokeless tobacco: Never   Tobacco comments:    11/01/2017  2 cigarettes in ~ 6 month   Vaping Use   Vaping status: Former   Substances: THC  Substance and Sexual Activity   Alcohol use: Not Currently    Alcohol/week: 7.0 standard drinks of alcohol    Types: 7 Cans of beer per week   Drug use: Not Currently    Frequency: 2.0 times per week    Types: Marijuana    Comment: 11/01/2017  weekly   Sexual activity: Yes    Partners: Male    Comment: pt. declined condoms  Other Topics Concern   Not  on file  Social History Narrative   Not on file   Social Drivers of Health   Financial Resource Strain: High Risk (01/06/2023)   Overall Financial Resource Strain (CARDIA)    Difficulty of Paying Living Expenses: Very hard  Food Insecurity: No Food Insecurity (06/05/2023)   Hunger Vital Sign    Worried About Running Out of Food in the Last Year: Never true    Ran Out of Food in the Last Year: Never true  Transportation Needs: Unmet Transportation Needs (06/05/2023)   PRAPARE - Transportation    Lack of Transportation (Medical): Yes    Lack of Transportation (Non-Medical): Yes  Physical Activity: Inactive (01/06/2023)   Exercise Vital Sign    Days of Exercise per Week: 0 days    Minutes of Exercise per Session: 0 min  Stress: Stress Concern Present (01/06/2023)   [employer] of Occupational Health - Occupational Stress Questionnaire    Feeling of Stress : Very much  Social Connections: Socially Isolated (01/06/2023)   Social Connection and Isolation Panel    Frequency of Communication with Friends and Family: Once a week    Frequency of Social Gatherings with Friends and Family: Once a week    Attends Religious Services: Never    Database administrator or Organizations: No    Attends Engineer, structural: Never    Marital Status: Never married    Allergies  Allergen Reactions   Shellfish Allergy Anaphylaxis and Hives   Aspirin  Other (See Comments)    Lump in armpit    Outpatient Medications Prior to Visit  Medication Sig Dispense Refill   acetaminophen  (TYLENOL ) 500 MG tablet Take 2 tablets (1,000 mg total) by mouth every 8 (eight) hours as needed for moderate pain (pain score 4-6), fever, mild pain (pain score 1-3) or headache. 30 tablet 0   Blood Glucose Monitoring Suppl (BLOOD GLUCOSE MONITOR SYSTEM) w/Device KIT Use as directed to check blood sugar three times per day. 1 kit 0   Camphor-Menthol-Methyl Sal (TIGER BALM MUSCLE RUB EX) Apply 1 Application topically  as needed.     elvitegravir-cobicistat-emtricitabine-tenofovir (GENVOYA ) 150-150-200-10 MG TABS tablet Take 1 tablet by mouth daily with breakfast. 30 tablet 5   Glucose Blood (BLOOD GLUCOSE TEST STRIPS) STRP Use as directed 3 times daily to check blood sugar 100 strip 0   Insulin  Pen Needle 31G X 8 MM MISC Use as directed three times daily. 100 each 0   Lancet Device MISC Use as directed three times daily. 1 each 0   Lancets MISC Use 3 (three) times daily to check blood sugar as directed. 100 each 0   ondansetron  (ZOFRAN ) 4 MG tablet Take 1 tablet (4 mg total)  by mouth every 8 (eight) hours as needed for nausea or vomiting. 12 tablet 0   amLODipine  (NORVASC ) 5 MG tablet Take 1 tablet (5 mg total) by mouth daily. 30 tablet 0   insulin  glargine (LANTUS ) 100 UNIT/ML Solostar Pen Inject 14 Units into the skin daily 15 mL 0   insulin  lispro (HUMALOG ) 100 UNIT/ML KwikPen Inject 6 Units into the skin with breakfast, with lunch, and with evening meal. For blood sugars 0-150 give 0 units of insulin , 151-200 give 2 units of insulin , 201-250 give 4 units, 251-300 give 6 units, 301-350 give 8 units, 351-400 give 10 units,> 400 give 12 units 15 mL 0   mirtazapine  (REMERON ) 15 MG tablet Take 0.5 tablets (7.5 mg total) by mouth at bedtime. (Patient taking differently: Take 7.5 mg by mouth 3 (three) times a week.) 15 tablet 0   sertraline  (ZOLOFT ) 100 MG tablet Take 1.5 tablets (150 mg total) by mouth daily. 135 tablet 1   valsartan  (DIOVAN ) 40 MG tablet Take 1 tablet (40 mg total) by mouth daily. 30 tablet 0   No facility-administered medications prior to visit.     ROS Review of Systems  Constitutional:  Negative for activity change and appetite change.  HENT:  Negative for sinus pressure and sore throat.   Respiratory:  Negative for chest tightness, shortness of breath and wheezing.   Cardiovascular:  Negative for chest pain and palpitations.  Gastrointestinal:  Negative for abdominal distention,  abdominal pain and constipation.  Genitourinary: Negative.   Musculoskeletal:        See HPI  Psychiatric/Behavioral:  Negative for behavioral problems and dysphoric mood.     Objective:  BP (!) 156/94   Pulse 92   Ht 6' (1.829 m)   Wt 195 lb (88.5 kg)   SpO2 100%   BMI 26.45 kg/m      08/29/2023   12:16 PM 08/29/2023   11:40 AM 06/03/2023   11:00 AM  BP/Weight  Systolic BP 156 174 167  Diastolic BP 94 97 106  Wt. (Lbs)  195   BMI  26.45 kg/m2       Physical Exam Constitutional:      Appearance: He is well-developed.   Cardiovascular:     Rate and Rhythm: Normal rate.     Heart sounds: Normal heart sounds. No murmur heard. Pulmonary:     Effort: Pulmonary effort is normal.     Breath sounds: Normal breath sounds. No wheezing or rales.  Chest:     Chest wall: No tenderness.  Abdominal:     General: Bowel sounds are normal. There is no distension.     Palpations: Abdomen is soft. There is no mass.     Tenderness: There is no abdominal tenderness.   Musculoskeletal:        General: Normal range of motion.     Right lower leg: No edema.     Left lower leg: No edema.   Neurological:     Mental Status: He is alert and oriented to person, place, and time.   Psychiatric:        Mood and Affect: Mood normal.        Latest Ref Rng & Units 06/03/2023    6:03 AM 06/01/2023   11:26 PM 06/01/2023   12:29 PM  CMP  Glucose 70 - 99 mg/dL 890  769  875   BUN 6 - 20 mg/dL 24  42  41   Creatinine 0.61 - 1.24 mg/dL 8.05  2.49  2.61   Sodium 135 - 145 mmol/L 138  134  138   Potassium 3.5 - 5.1 mmol/L 4.1  4.3  4.2   Chloride 98 - 111 mmol/L 106  104  106   CO2 22 - 32 mmol/L 24  22  20    Calcium  8.9 - 10.3 mg/dL 9.1  8.6  8.6     Lipid Panel     Component Value Date/Time   CHOL 191 03/24/2023 0927   CHOL 169 02/10/2022 1223   TRIG 69 03/24/2023 0927   HDL 95 03/24/2023 0927   HDL 99 02/10/2022 1223   CHOLHDL 2.0 03/24/2023 0927   VLDL 10 09/24/2020 1434   LDLCALC  81 03/24/2023 0927    CBC    Component Value Date/Time   WBC 7.4 06/03/2023 0603   RBC 4.36 06/03/2023 0603   HGB 13.4 06/03/2023 0603   HCT 38.6 (L) 06/03/2023 0603   PLT 231 06/03/2023 0603   MCV 88.5 06/03/2023 0603   MCH 30.7 06/03/2023 0603   MCHC 34.7 06/03/2023 0603   RDW 13.9 06/03/2023 0603   LYMPHSABS 0.8 05/31/2023 1427   MONOABS 0.3 05/31/2023 1427   EOSABS 0.0 05/31/2023 1427   BASOSABS 0.0 05/31/2023 1427    Lab Results  Component Value Date   HGBA1C 9.4 (H) 06/01/2023    Lab Results  Component Value Date   HGBA1C 9.4 (H) 06/01/2023   HGBA1C 8.6 (H) 10/31/2022   HGBA1C 8.5 (H) 10/02/2022      1. Severe episode of recurrent major depressive disorder, without psychotic features (HCC)/ insomnia Uncontrolled Symptoms have worsened due to underlying S DOH challenges - mirtazapine  (REMERON ) 15 MG tablet; Take 0.5 tablets (7.5 mg total) by mouth at bedtime.  Dispense: 90 tablet; Refill: 1 - sertraline  (ZOLOFT ) 100 MG tablet; Take 1.5 tablets (150 mg total) by mouth daily.  Dispense: 135 tablet; Refill: 1  2. Diabetes mellitus with insulin  therapy (HCC) Uncontrolled with A1c of 9.4 He will benefit from a CGM and an insulin  pump I have spoken with the clinical pharmacist who will assist him in obtaining his CGM Labile blood sugars hence I am unable to increase his Lantus  dose further - Ambulatory referral to Endocrinology - insulin  lispro (HUMALOG ) 100 UNIT/ML KwikPen; Inject 0-12 Units into the skin with breakfast, with lunch, and with evening meal. As per sliding scale.For blood sugars 0-150 give 0 units of insulin , 151-200 give 2 units of insulin , 201-250 give 4 units, 251-300 give 6 units, 301-350 give 8 units, 351-400 give 10 units,> 400 give 12 units  Dispense: 30 mL; Refill: 2  3. 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  Dispense: 30 mL; Refill: 2  4. Homelessness Case manager called in to assist with disability questions and [facility] He will also need resources with regards to housing  5. Asymptomatic HIV infection, with no history of HIV-related illness (HCC) Stable Currently on antiretroviral therapy Follow-up with infectious disease.  6. Hypertension associated with stage 3 chronic kidney disease due to type 1 diabetes mellitus (HCC) Controlled Increase Diovan  dose See back in 1 month to reassess blood pressure Counseled on blood pressure goal of less than 130/80, low-sodium, DASH diet, medication compliance, 150 minutes  of moderate intensity exercise per week. Discussed medication compliance, adverse effects. - valsartan  (DIOVAN ) 80 MG tablet; Take 1 tablet (80 mg total) by mouth daily.  Dispense: 90 tablet; Refill: 1 - amLODipine  (NORVASC ) 5 MG tablet; Take 1 tablet (5 mg total) by mouth daily.  Dispense: 90 tablet; Refill: 1  7. Long term current use of insulin  (HCC) See #1 above - insulin  glargine (LANTUS ) 100 UNIT/ML Solostar Pen; Inject 22 Units into the skin daily.  Dispense: 30 mL; Refill: 2 - insulin  lispro (HUMALOG ) 100 UNIT/ML KwikPen; Inject 0-12 Units into the skin with breakfast, with lunch, and with evening meal. As per sliding scale.For blood sugars 0-150 give 0 units of insulin , 151-200 give 2 units of insulin , 201-250 give 4 units, 251-300 give 6 units, 301-350 give 8 units, 351-400 give 10 units,> 400 give 12 units  Dispense: 30 mL; Refill: 2   Meds ordered this encounter  Medications   valsartan  (DIOVAN ) 80 MG tablet    Sig: Take 1 tablet (80 mg total) by mouth daily.    Dispense:  90 tablet    Refill:  1    Dose increased   mirtazapine  (REMERON ) 15 MG tablet    Sig: Take 0.5 tablets (7.5 mg total) by mouth at  bedtime.    Dispense:  90 tablet    Refill:  1   sertraline  (ZOLOFT ) 100 MG tablet    Sig: Take 1.5 tablets (150 mg total) by mouth daily.    Dispense:  135 tablet    Refill:  1    For depression   amLODipine  (NORVASC ) 5 MG tablet    Sig: Take 1 tablet (5 mg total) by mouth daily.    Dispense:  90 tablet    Refill:  1   insulin  glargine (LANTUS ) 100 UNIT/ML Solostar Pen    Sig: Inject 22 Units into the skin daily.    Dispense:  30 mL    Refill:  2    May substitute as needed per insurance.   insulin  lispro (HUMALOG ) 100 UNIT/ML KwikPen    Sig: Inject 0-12 Units into the skin with breakfast, with lunch, and with evening meal. As per sliding scale.For blood sugars 0-150 give 0 units of insulin , 151-200 give 2 units of insulin , 201-250 give 4 units, 251-300 give 6 units, 301-350 give 8 units, 351-400 give 10 units,> 400 give 12 units    Dispense:  30 mL    Refill:  2    For blood sugars 0-150 give 0 units of insulin , 151-200 give 2 units of insulin , 201-250 give 4 units, 251-300 give 6 units, 301-350 give 8 units, 351-400 give 10 units,> 400 give 12 units    Follow-up: Return in about 1 month (around 09/29/2023) for Blood Pressure follow-up with PCP.       Corrina Sabin, MD, FAAFP. [facility] [place], [place] [phone]   08/29/2023, 1:20 PM

## 2023-08-30 ENCOUNTER — Telehealth: Payer: Self-pay | Admitting: *Deleted

## 2023-08-30 NOTE — Progress Notes (Unsigned)
 Complex Care Management Note Care Guide Note  08/30/2023 Name: [name] MRN: [id number] DOB: Mar 11, 1985   Complex Care Management Outreach Attempts: An unsuccessful telephone outreach was attempted today to offer the patient information about available complex care management services.  Follow Up Plan:  Additional outreach attempts will be made to offer the patient complex care management information and services.   Encounter Outcome:  No Answer  Harlene Satterfield  [facility]  [facility], [facility] Guide  Direct Dial : [phone]  Fax [phone]

## 2023-08-30 NOTE — Telephone Encounter (Signed)
 The following message was received from Leonor Endo, [place] Disability Specialist- [facility]:   If he already has a claim pending or if he has been denied. If he has been denied he will need to produce the denial letter. If his claim is pending he will need to submit a withdrawal letter/or call to [facility] to stop the process. Once that has been completed, you can submit his referral. He will need to provide the [facility] information needed. He will need to ask them to send him a confirmation letter of his withdrawal. Once I have a copy of the withdrawal letter, I can begin a new claim.      I called the patient to share the information from [place]; but had to leave a message requesting a call back

## 2023-08-31 NOTE — Telephone Encounter (Signed)
 I spoke to the patient and explained the information from [employer] program.  I encouraged him to think about how he would like to proceed- continue to work with [facility] on his own or withdraw his current case and work with [employer] after he receives notification of withdrawal and he said he will think about it.    I told him that I determined that his Tailored Care Management Organization is [facility] and I left them a message requesting a call back to we can find out if he has been assigned a care Production designer, theatre/television/film.   He said he got the [employer] in the afternoon the same day he was at this clinic- 08/29/2023 and he stated he is getting used to it.  He said today his blood sugars have not been > 200 all day.   He explained that he worked yesterday and today and it was very stressful.  He said he has a lot of anxiety about the job and the working environment and they asked him to leave early today due to the anxiety. He said his blood sugar started to drop while he was at work despite eating some candy.  He noted that the person who is helping to orient him to the job is willing to work with him as he adjusts to this new role and he tries to control his blood sugars.

## 2023-08-31 NOTE — Progress Notes (Signed)
 Complex Care Management Note  Care Guide Note 08/31/2023 Name: [name] MRN: [id number] DOB: 09-17-1985  [name] is a 38 y.o. year old male who sees Delbert Clam, MD for primary care. I reached out to [employer] by phone today to offer complex care management services.  [name] was given information about Complex Care Management services today including:   The Complex Care Management services include support from the care team which includes your Nurse Care Manager, Clinical Social Worker, or Pharmacist.  The Complex Care Management team is here to help remove barriers to the health concerns and goals most important to you. Complex Care Management services are voluntary, and the patient may decline or stop services at any time by request to their care team member.   Complex Care Management Consent Status: Patient agreed to services and verbal consent obtained.   Follow up plan:  Telephone appointment with complex care management team member scheduled for:  09/05/23  Encounter Outcome:  Patient Scheduled  Harlene Satterfield  [facility]  [facility], [facility] Guide  Direct Dial : [phone]  Fax [phone]

## 2023-08-31 NOTE — Telephone Encounter (Signed)
 Altamese Adler, Referral Coordinator, was able to determine that patient's tailored care management agency is [facility]: [phone].  I called [facility] and had to leave a message on Ms Chandra' voicemail requesting a call back

## 2023-08-31 NOTE — Progress Notes (Signed)
 Complex Care Management Note Care Guide Note  08/31/2023 Name: [name] MRN: [id number] DOB: 02/25/1986   Complex Care Management Outreach Attempts: A second unsuccessful outreach was attempted today to offer the patient with information about available complex care management services.  Follow Up Plan:  Additional outreach attempts will be made to offer the patient complex care management information and services.   Encounter Outcome:  No Answer  Harlene Satterfield  [facility]  [facility], [facility] Guide  Direct Dial : [phone]  Fax [phone]

## 2023-09-01 ENCOUNTER — Other Ambulatory Visit: Payer: Self-pay | Admitting: Family

## 2023-09-01 DIAGNOSIS — Z21 Asymptomatic human immunodeficiency virus [HIV] infection status: Secondary | ICD-10-CM

## 2023-09-04 ENCOUNTER — Other Ambulatory Visit: Payer: Self-pay

## 2023-09-04 ENCOUNTER — Other Ambulatory Visit: Payer: MEDICAID

## 2023-09-04 ENCOUNTER — Other Ambulatory Visit (HOSPITAL_COMMUNITY)
Admission: RE | Admit: 2023-09-04 | Discharge: 2023-09-04 | Disposition: A | Discharge status: Home / Self Care | Payer: MEDICAID | Source: Ambulatory Visit | Attending: Family | Admitting: Family

## 2023-09-04 DIAGNOSIS — Z113 Encounter for screening for infections with a predominantly sexual mode of transmission: Secondary | ICD-10-CM | POA: Insufficient documentation

## 2023-09-04 DIAGNOSIS — Z21 Asymptomatic human immunodeficiency virus [HIV] infection status: Secondary | ICD-10-CM | POA: Diagnosis present

## 2023-09-04 NOTE — Telephone Encounter (Signed)
 Call received from [facility], Tailored Care Manager with [facility]: [phone]  who stated he has been assigned to the patient.  I provided him with the patient's phone number and requested that he reach out to the patient as soon as possible and he said he will.  Chyrl also said that I can share his phone number with the patient.

## 2023-09-04 NOTE — Telephone Encounter (Signed)
 I spoke to [name]/ [facility] and she said that he has not been assigned a case Production designer, theatre/television/film yet.  She stated she would sent the case management department a message asking them to call me back today

## 2023-09-04 NOTE — Telephone Encounter (Signed)
 Appt 7/10.

## 2023-09-05 ENCOUNTER — Other Ambulatory Visit: Payer: MEDICAID

## 2023-09-05 LAB — URINE CYTOLOGY ANCILLARY ONLY
Chlamydia: NEGATIVE
Comment: NEGATIVE
Comment: NORMAL
Neisseria Gonorrhea: NEGATIVE

## 2023-09-05 LAB — T-HELPER CELL (CD4) - (RCID CLINIC ONLY)
CD4 % Helper T Cell: 38 % (ref 33–65)
CD4 T Cell Abs: 667 /uL (ref 400–1790)

## 2023-09-05 NOTE — Patient Outreach (Signed)
 Complex Care Management   Visit Note  09/05/2023  Name:  [name] MRN: [id number] DOB: 04-03-1985  Situation: Referral received for Complex Care Management related to connecting with [facility]. I obtained verbal consent from Patient.  Visit completed with patient  on the phone  Background:   Past Medical History:  Diagnosis Date   Anxiety    Asthma    Closed displaced comminuted fracture of shaft of right tibia 11/01/2017   Closed fracture of base of fifth metatarsal bone of right foot 11/13/2017   Closed fracture of medial malleolus of right ankle 10/31/2017   Depression    Headache    only w/stress (11/01/2017)   HIV (human immunodeficiency virus infection) (HCC)    Hypoglycemia due to insulin  06/27/2022   Migraine    a few/month (11/01/2017)   Refusal of blood transfusions as patient is Jehovah's Witness    Seizure (HCC)    only w/my low blood sugars  (11/01/2017)   Type I diabetes mellitus (HCC)     Assessment:  Patient reports he has not contacted [employer] to enroll for services.  SW t/c [name] and is referred to [facility] [phone] for Care Management.  SW t/c [facility] and discovered patient assessment was completed on 09/04/23 to enroll.  Staff requested CM [name] to call patient.  Patient receives call [name] during assessment and conference SW.  [name] confirms that he is the assigned Care Manager.  Patient request an explanation of the services.  Both SW and [name] explained how the service will benefit patient.  Assessment was not completed.   Recommendation:   None  Follow Up Plan:   Patient has met all care management goals. Care Management case will be closed. Patient has been provided contact information should new needs arise.   [name], BSW [place]  [facility], [facility] Social Worker Direct Dial : [phone]  Fax: [phone] Website: [email or web address]

## 2023-09-05 NOTE — Patient Instructions (Signed)
 Visit Information  Thank you for taking time to visit with me today. Please don't hesitate to contact me if I can be of assistance to you before our next scheduled appointment.    Following is a copy of your care plan:   Goals Addressed   None     Please call 911 if you are experiencing a Mental Health or Behavioral Health Crisis or need someone to talk to.  Patient verbalizes understanding of instructions and care plan provided today and agrees to view in MyChart. Active MyChart status and patient understanding of how to access instructions and care plan via MyChart confirmed with patient.     [name], BSW [place]  [facility], [facility] Social Worker Direct Dial: [phone]  Fax: [phone] Website: [email or web address]

## 2023-09-07 ENCOUNTER — Ambulatory Visit: Payer: MEDICAID | Admitting: Family

## 2023-09-07 LAB — RPR TITER: RPR Titer: 1:16 {titer} — ABNORMAL HIGH

## 2023-09-07 LAB — COMPLETE METABOLIC PANEL WITHOUT GFR
AG Ratio: 1.6 (calc) (ref 1.0–2.5)
ALT: 19 U/L (ref 9–46)
AST: 16 U/L (ref 10–40)
Albumin: 4.1 g/dL (ref 3.6–5.1)
Alkaline phosphatase (APISO): 64 U/L (ref 36–130)
BUN/Creatinine Ratio: 12 (calc) (ref 6–22)
BUN: 20 mg/dL (ref 7–25)
CO2: 26 mmol/L (ref 20–32)
Calcium: 9.6 mg/dL (ref 8.6–10.3)
Chloride: 105 mmol/L (ref 98–110)
Creat: 1.71 mg/dL — ABNORMAL HIGH (ref 0.60–1.26)
Globulin: 2.6 g/dL (ref 1.9–3.7)
Glucose, Bld: 134 mg/dL — ABNORMAL HIGH (ref 65–99)
Potassium: 4.5 mmol/L (ref 3.5–5.3)
Sodium: 140 mmol/L (ref 135–146)
Total Bilirubin: 0.3 mg/dL (ref 0.2–1.2)
Total Protein: 6.7 g/dL (ref 6.1–8.1)

## 2023-09-07 LAB — CBC WITH DIFFERENTIAL/PLATELET
Absolute Lymphocytes: 1978 {cells}/uL (ref 850–3900)
Absolute Monocytes: 526 {cells}/uL (ref 200–950)
Basophils Absolute: 29 {cells}/uL (ref 0–200)
Basophils Relative: 0.4 %
Eosinophils Absolute: 161 {cells}/uL (ref 15–500)
Eosinophils Relative: 2.2 %
HCT: 46.3 % (ref 38.5–50.0)
Hemoglobin: 15.4 g/dL (ref 13.2–17.1)
MCH: 30.7 pg (ref 27.0–33.0)
MCHC: 33.3 g/dL (ref 32.0–36.0)
MCV: 92.4 fL (ref 80.0–100.0)
MPV: 9.8 fL (ref 7.5–12.5)
Monocytes Relative: 7.2 %
Neutro Abs: 4606 {cells}/uL (ref 1500–7800)
Neutrophils Relative %: 63.1 %
Platelets: 297 Thousand/uL (ref 140–400)
RBC: 5.01 Million/uL (ref 4.20–5.80)
RDW: 13.8 % (ref 11.0–15.0)
Total Lymphocyte: 27.1 %
WBC: 7.3 Thousand/uL (ref 3.8–10.8)

## 2023-09-07 LAB — HIV-1 RNA QUANT-NO REFLEX-BLD
HIV 1 RNA Quant: NOT DETECTED {copies}/mL
HIV-1 RNA Quant, Log: NOT DETECTED {Log_copies}/mL

## 2023-09-07 LAB — RPR: RPR Ser Ql: REACTIVE — AB

## 2023-09-07 LAB — T PALLIDUM AB: T Pallidum Abs: POSITIVE — AB

## 2023-09-07 NOTE — Telephone Encounter (Signed)
 LVM for pt to call about missed appt

## 2023-09-08 ENCOUNTER — Ambulatory Visit: Payer: Self-pay | Admitting: Family

## 2023-09-29 ENCOUNTER — Telehealth: Payer: Self-pay | Admitting: Family Medicine

## 2023-09-29 NOTE — Telephone Encounter (Signed)
 Called patient, no answer. Left voicemail confirming upcoming appointment on 10/02/2023 at 2:10 pm. Provided callback number for any questions or changes.

## 2023-10-02 ENCOUNTER — Ambulatory Visit: Payer: MEDICAID | Admitting: Family Medicine

## 2023-10-16 ENCOUNTER — Encounter (HOSPITAL_COMMUNITY): Payer: Self-pay

## 2023-10-16 ENCOUNTER — Observation Stay (HOSPITAL_COMMUNITY)
Admission: EM | Admit: 2023-10-16 | Discharge: 2023-10-19 | Disposition: A | Discharge status: Home / Self Care | Payer: MEDICAID | Attending: Internal Medicine | Admitting: Internal Medicine

## 2023-10-16 ENCOUNTER — Other Ambulatory Visit: Payer: Self-pay

## 2023-10-16 DIAGNOSIS — Z794 Long term (current) use of insulin: Secondary | ICD-10-CM | POA: Diagnosis not present

## 2023-10-16 DIAGNOSIS — N182 Chronic kidney disease, stage 2 (mild): Secondary | ICD-10-CM | POA: Diagnosis present

## 2023-10-16 DIAGNOSIS — E162 Hypoglycemia, unspecified: Secondary | ICD-10-CM | POA: Diagnosis present

## 2023-10-16 DIAGNOSIS — E1021 Type 1 diabetes mellitus with diabetic nephropathy: Secondary | ICD-10-CM | POA: Diagnosis not present

## 2023-10-16 DIAGNOSIS — Z5900 Homelessness unspecified: Secondary | ICD-10-CM | POA: Insufficient documentation

## 2023-10-16 DIAGNOSIS — I10 Essential (primary) hypertension: Secondary | ICD-10-CM | POA: Diagnosis not present

## 2023-10-16 DIAGNOSIS — J45998 Other asthma: Secondary | ICD-10-CM | POA: Insufficient documentation

## 2023-10-16 DIAGNOSIS — E1022 Type 1 diabetes mellitus with diabetic chronic kidney disease: Secondary | ICD-10-CM | POA: Insufficient documentation

## 2023-10-16 DIAGNOSIS — J4520 Mild intermittent asthma, uncomplicated: Secondary | ICD-10-CM

## 2023-10-16 DIAGNOSIS — E10649 Type 1 diabetes mellitus with hypoglycemia without coma: Principal | ICD-10-CM | POA: Insufficient documentation

## 2023-10-16 DIAGNOSIS — E109 Type 1 diabetes mellitus without complications: Secondary | ICD-10-CM | POA: Diagnosis present

## 2023-10-16 DIAGNOSIS — Z21 Asymptomatic human immunodeficiency virus [HIV] infection status: Secondary | ICD-10-CM | POA: Diagnosis present

## 2023-10-16 DIAGNOSIS — N1832 Chronic kidney disease, stage 3b: Secondary | ICD-10-CM

## 2023-10-16 DIAGNOSIS — I129 Hypertensive chronic kidney disease with stage 1 through stage 4 chronic kidney disease, or unspecified chronic kidney disease: Secondary | ICD-10-CM | POA: Diagnosis not present

## 2023-10-16 DIAGNOSIS — J45909 Unspecified asthma, uncomplicated: Secondary | ICD-10-CM | POA: Diagnosis present

## 2023-10-16 DIAGNOSIS — E1010 Type 1 diabetes mellitus with ketoacidosis without coma: Secondary | ICD-10-CM

## 2023-10-16 DIAGNOSIS — R110 Nausea: Secondary | ICD-10-CM

## 2023-10-16 LAB — CBC WITH DIFFERENTIAL/PLATELET
Abs Immature Granulocytes: 0.03 K/uL (ref 0.00–0.07)
Basophils Absolute: 0 K/uL (ref 0.0–0.1)
Basophils Relative: 0 %
Eosinophils Absolute: 0 K/uL (ref 0.0–0.5)
Eosinophils Relative: 0 %
HCT: 48.3 % (ref 39.0–52.0)
Hemoglobin: 16.1 g/dL (ref 13.0–17.0)
Immature Granulocytes: 0 %
Lymphocytes Relative: 14 %
Lymphs Abs: 1.4 K/uL (ref 0.7–4.0)
MCH: 30.8 pg (ref 26.0–34.0)
MCHC: 33.3 g/dL (ref 30.0–36.0)
MCV: 92.5 fL (ref 80.0–100.0)
Monocytes Absolute: 0.7 K/uL (ref 0.1–1.0)
Monocytes Relative: 7 %
Neutro Abs: 7.4 K/uL (ref 1.7–7.7)
Neutrophils Relative %: 79 %
Platelets: 348 K/uL (ref 150–400)
RBC: 5.22 MIL/uL (ref 4.22–5.81)
RDW: 14.2 % (ref 11.5–15.5)
WBC: 9.5 K/uL (ref 4.0–10.5)
nRBC: 0 % (ref 0.0–0.2)

## 2023-10-16 LAB — CBG MONITORING, ED
Glucose-Capillary: 147 mg/dL — ABNORMAL HIGH (ref 70–99)
Glucose-Capillary: 249 mg/dL — ABNORMAL HIGH (ref 70–99)
Glucose-Capillary: 25 mg/dL — CL (ref 70–99)

## 2023-10-16 LAB — BASIC METABOLIC PANEL WITH GFR
Anion gap: 11 (ref 5–15)
BUN: 17 mg/dL (ref 6–20)
CO2: 26 mmol/L (ref 22–32)
Calcium: 9.7 mg/dL (ref 8.9–10.3)
Chloride: 104 mmol/L (ref 98–111)
Creatinine, Ser: 1.59 mg/dL — ABNORMAL HIGH (ref 0.61–1.24)
GFR, Estimated: 57 mL/min — ABNORMAL LOW (ref >60)
Glucose, Bld: 46 mg/dL — ABNORMAL LOW (ref 70–99)
Potassium: 4.5 mmol/L (ref 3.5–5.1)
Sodium: 141 mmol/L (ref 135–145)

## 2023-10-16 MED ORDER — DEXTROSE 50 % IV SOLN
25.0000 g | Freq: Once | INTRAVENOUS | Status: AC
  Administered 2023-10-16: 25 g via INTRAVENOUS

## 2023-10-16 MED ORDER — DEXTROSE 50 % IV SOLN
INTRAVENOUS | Status: AC
  Administered 2023-10-16: 50 mL
  Filled 2023-10-16: qty 50

## 2023-10-16 MED ORDER — DEXTROSE 5 % IV BOLUS
1000.0000 mL | Freq: Once | INTRAVENOUS | Status: AC
  Administered 2023-10-16: 1000 mL via INTRAVENOUS

## 2023-10-16 MED ORDER — ONDANSETRON HCL 4 MG/2ML IJ SOLN
4.0000 mg | Freq: Once | INTRAMUSCULAR | Status: AC
  Administered 2023-10-16: 4 mg via INTRAVENOUS
  Filled 2023-10-16: qty 2

## 2023-10-16 NOTE — ED Triage Notes (Signed)
 Pt was BIB [facility] from [facility] for confusion and diaphoresis\/ Hypoglycemia. He took his insulin  and then started to eat when these symptoms started.  174/98 BP 20 RR 98% RA CBG 30 on arrival Most current 111.  D10  22GA LH  He had 3 glasses of orange juice.

## 2023-10-16 NOTE — H&P (Signed)
 [name] FMW:[id number] DOB: 11/09/1985 DOA: 10/16/2023     PCP: Delbert Clam, MD   Patient arrived to ER on 10/16/23 at 2134 Referred by Attending Juan Ozell HERO, MD   Patient coming from:   From [facility]    Chief Complaint:  Chief Complaint  Patient presents with   Hypoglycemia    HPI: [name] is a 38 y.o. male with medical history significant of DM1, HTN  HIV, CKD, asthma  Presented with  hypoglycemia Pt coming from [facility] with confusion diaphoresis and found to have hypoglycemia Patient is diabetic took his insulin  and try to eat but found to have CBG of 30 Improved up to 111 but after D10 and free glasses of orange juice But then in the ER plummeted back down to 25 Patient is type I diabetic He is not on insulin  pump uses insulin  instead Supposed to be on 22 units of Lantus  And then sliding scale and meals    Patient states he took Lantus  22 units as he always does in the morning He had poor p.o. intake throughout the day   did not had a good appetite Finally he decided to eat the pizza Sat down and gave himself 6 units of insulin  in anticipation to eat a few slices he could only take a few bites before he started to have symptoms of hypoglycemia Reports he often has issues with hypoglycemic episodes because of very difficult to manage type 1 diabetes He does not have endocrinologist but very interested in follow-up   Denies significant ETOH intake   Does not smoke    Regarding pertinent Chronic problems:    Hyperlipidemia -  on statins Pravachol  (pravastatin )  Lipid Panel     Component Value Date/Time   CHOL 191 03/24/2023 0927   CHOL 169 02/10/2022 1223   TRIG 69 03/24/2023 0927   HDL 95 03/24/2023 0927   HDL 99 02/10/2022 1223   CHOLHDL 2.0 03/24/2023 0927   VLDL 10 09/24/2020 1434   LDLCALC 81 03/24/2023 0927   LABVLDL 14 02/10/2022 1223    HTN on Norvasc , Diovan ,       DM 1  -  Lab Results  Component Value Date   HGBA1C 9.4 (H) 06/01/2023   on insulin ,      Asthma -well   controlled on home inhalers/ nebs                       CKD stage IIIb baseline Cr 1.7 Estimated Creatinine Clearance: 69.8 mL/min (A) (by C-G formula based on SCr of 1.59 mg/dL (H)).  Lab Results  Component Value Date   CREATININE 1.59 (H) 10/16/2023   CREATININE 1.71 (H) 09/04/2023   CREATININE 1.94 (H) 06/03/2023   Lab Results  Component Value Date   NA 141 10/16/2023   CL 104 10/16/2023   K 4.5 10/16/2023   CO2 26 10/16/2023   BUN 17 10/16/2023   CREATININE 1.59 (H) 10/16/2023   GFRNONAA 57 (L) 10/16/2023   CALCIUM  9.7 10/16/2023   PHOS 3.9 06/03/2023   ALBUMIN 3.8 05/31/2023   GLUCOSE 46 (L) 10/16/2023    While in ER:   Found to be persistently hypoglycemic     Lab Orders         Basic metabolic panel         CBC with Differential         CBG monitoring, ED  CBG monitoring, ED       Following Medications were ordered in ER: Medications  ondansetron  (ZOFRAN ) injection 4 mg (4 mg Intravenous Given 10/16/23 2229)  dextrose  50 % solution 25 g (25 g Intravenous Given 10/16/23 2311)  dextrose  5 % bolus 1,000 mL (1,000 mLs Intravenous New Bag/Given 10/16/23 2312)  dextrose  50 % solution (50 mLs  Given 10/16/23 2319)    ______________    ED Triage Vitals  Encounter Vitals Group     BP 10/16/23 2141 (!) 168/120     Girls Systolic BP Percentile --      Girls Diastolic BP Percentile --      Boys Systolic BP Percentile --      Boys Diastolic BP Percentile --      Pulse Rate 10/16/23 2141 73     Resp 10/16/23 2141 17     Temp 10/16/23 2138 (!) 97.4 F (36.3 C)     Temp Source 10/16/23 2138 Oral     SpO2 10/16/23 2141 100 %     Weight 10/16/23 2141 190 lb (86.2 kg)     Height 10/16/23 2141 6' (1.829 m)     Head Circumference --      Peak Flow --      Pain Score 10/16/23 2140 7     Pain Loc --      Pain Education --      Exclude from Growth Chart --    UFJK(75)@     _________________________________________ Significant initial  Findings: Abnormal Labs Reviewed  BASIC METABOLIC PANEL WITH GFR - Abnormal; Notable for the following components:      Result Value   Glucose, Bld 46 (*)    Creatinine, Ser 1.59 (*)    GFR, Estimated 57 (*)    All other components within normal limits  CBG MONITORING, ED - Abnormal; Notable for the following components:   Glucose-Capillary 147 (*)    All other components within normal limits  CBG MONITORING, ED - Abnormal; Notable for the following components:   Glucose-Capillary 25 (*)    All other components within normal limits       ECG: Ordered Personally reviewed and interpreted by me showing: HR : 72 Rhythm: Sinus rhythm Nonspecific T abnormalities, inferior leads ST elev, probable normal early repol pattern QTC 470     The recent clinical data is shown below. Vitals:   10/16/23 2138 10/16/23 2141 10/16/23 2300  BP:  (!) 168/120 (!) 169/101  Pulse:  73 85  Resp:  17 (!) 24  Temp: (!) 97.4 F (36.3 C)    TempSrc: Oral    SpO2:  100% 100%  Weight:  86.2 kg   Height:  6' (1.829 m)     WBC     Component Value Date/Time   WBC 9.5 10/16/2023 2209   LYMPHSABS 1.4 10/16/2023 2209   MONOABS 0.7 10/16/2023 2209   EOSABS 0.0 10/16/2023 2209   BASOSABS 0.0 10/16/2023 2209       UA   ordered      ____  __________________________________________________________ Recent Labs  Lab 10/16/23 2209  NA 141  K 4.5  CO2 26  GLUCOSE 46*  BUN 17  CREATININE 1.59*  CALCIUM  9.7    Cr    stable,    Lab Results  Component Value Date   CREATININE 1.59 (H) 10/16/2023   CREATININE 1.71 (H) 09/04/2023   CREATININE 1.94 (H) 06/03/2023    No results for input(s): AST, ALT, ALKPHOS, BILITOT, PROT,  ALBUMIN in the last 168 hours. Lab Results  Component Value Date   CALCIUM  9.7 10/16/2023   PHOS 3.9 06/03/2023       Plt: Lab Results  Component Value Date   PLT 348  10/16/2023       Recent Labs  Lab 10/16/23 2209  WBC 9.5  NEUTROABS 7.4  HGB 16.1  HCT 48.3  MCV 92.5  PLT 348    HG/HCT   stable,      Component Value Date/Time   HGB 16.1 10/16/2023 2209   HCT 48.3 10/16/2023 2209   MCV 92.5 10/16/2023 2209       _______________________________________________ Hospitalist was called for admission for   Hypoglycemia   The following Work up has been ordered so far:  Orders Placed This Encounter  Procedures   Basic metabolic panel   CBC with Differential   ED Cardiac monitoring   Consult to hospitalist   CBG monitoring, ED   CBG monitoring, ED   ED EKG   EKG 12-Lead   EKG 12-Lead     OTHER Significant initial  Findings:  labs showing:     DM  labs:  HbA1C: Recent Labs    10/31/22 0845 06/01/23 1229  HGBA1C 8.6* 9.4*    CBG (last 3)  Recent Labs    10/16/23 2138 10/16/23 2307 10/16/23 2338  GLUCAP 147* 25* 249*    Cultures:    Component Value Date/Time   SDES BLOOD LEFT HAND 06/01/2023 1229   SDES BLOOD LEFT HAND 06/01/2023 1229   SPECREQUEST  06/01/2023 1229    BOTTLES DRAWN AEROBIC AND ANAEROBIC Blood Culture adequate volume   SPECREQUEST  06/01/2023 1229    BOTTLES DRAWN AEROBIC AND ANAEROBIC Blood Culture adequate volume   CULT  06/01/2023 1229    NO GROWTH 5 DAYS Performed at [facility], [address], [place], [place] [address]    CULT  06/01/2023 1229    NO GROWTH 5 DAYS Performed at [facility], [address] [place], [place] [address]    REPTSTATUS 06/06/2023 FINAL 06/01/2023 1229   REPTSTATUS 06/06/2023 FINAL 06/01/2023 1229     Radiological Exams on Admission: No results found. _______________________________________________________________________________________________________ Latest  Blood pressure (!) 169/101, pulse 85, temperature (!) 97.4 F (36.3 C), temperature source Oral, resp. rate (!) 24, height 6' (1.829 m), weight 86.2 kg, SpO2 100%.   Vitals  labs and  radiology finding personally reviewed  Review of Systems:    Pertinent positives include:  fatigue, diaphoresis  Constitutional:  No weight loss, night sweats, Fevers, chills,  weight loss  HEENT:  No headaches, Difficulty swallowing,Tooth/dental problems,Sore throat,  No sneezing, itching, ear ache, nasal congestion, post nasal drip,  Cardio-vascular:  No chest pain, Orthopnea, PND, anasarca, dizziness, palpitations.no Bilateral lower extremity swelling  GI:  No heartburn, indigestion, abdominal pain, nausea, vomiting, diarrhea, change in bowel habits, loss of appetite, melena, blood in stool, hematemesis Resp:  no shortness of breath at rest. No dyspnea on exertion, No excess mucus, no productive cough, No non-productive cough, No coughing up of blood.No change in color of mucus.No wheezing. Skin:  no rash or lesions. No jaundice GU:  no dysuria, change in color of urine, no urgency or frequency. No straining to urinate.  No flank pain.  Musculoskeletal:  No joint pain or no joint swelling. No decreased range of motion. No back pain.  Psych:  No change in mood or affect. No depression or anxiety. No memory loss.  Neuro: no localizing neurological complaints, no tingling, no weakness, no double vision, no gait abnormality, no slurred speech, no confusion  All systems reviewed and apart from HOPI all are negative _______________________________________________________________________________________________ Past Medical History:   Past Medical History:  Diagnosis Date   Anxiety    Asthma    Closed displaced comminuted fracture of shaft of right tibia 11/01/2017   Closed fracture of base of fifth metatarsal bone of right foot 11/13/2017   Closed fracture of medial malleolus of right ankle 10/31/2017   Depression    Headache    only w/stress (11/01/2017)   HIV (human immunodeficiency virus infection) (HCC)    Hypoglycemia due to insulin  06/27/2022   Migraine    a few/month  (11/01/2017)   Refusal of blood transfusions as patient is Jehovah's Witness    Seizure (HCC)    only w/my low blood sugars  (11/01/2017)   Type I diabetes mellitus (HCC)      Past Surgical History:  Procedure Laterality Date   FRACTURE SURGERY     IM NAILING TIBIA Right 11/01/2017   INTRAMEDULLARY (IM) NAIL TIBIALRightGeneral   ORIF ANKLE FRACTURE Right 11/01/2017   ORIF ANKLE FRACTURE Right 11/01/2017   Procedure: OPEN REDUCTION INTERNAL FIXATION (ORIF) ANKLE FRACTURE;  Surgeon: Kendal Franky SQUIBB, MD;  Location: [facility] OR;  Service: Orthopedics;  Laterality: Right;   TIBIA IM NAIL INSERTION Right 11/01/2017   Procedure: INTRAMEDULLARY (IM) NAIL TIBIAL;  Surgeon: Kendal Franky SQUIBB, MD;  Location: [facility] OR;  Service: Orthopedics;  Laterality: Right;    Social History:  Ambulatory   independently      reports that he quit smoking about 10 months ago. His smoking use included cigars. He started smoking about 8 years ago. He has never used smokeless tobacco. He reports that he does not currently use alcohol after a past usage of about 7.0 standard drinks of alcohol per week. He reports that he does not currently use drugs after having used the following drugs: Marijuana. Frequency: 2.00 times per week.   Family History:   Family History  Problem Relation Age of Onset   Thyroid  disease Mother    Diabetes Mother    Breast cancer Maternal Grandmother    Diabetes Maternal Grandmother    ______________________________________________________________________________________________ Allergies: Allergies  Allergen Reactions   Shellfish Allergy Anaphylaxis and Hives   Aspirin  Other (See Comments)    Lump in armpit     Prior to Admission medications   Medication Sig Start Date End Date Taking? Authorizing Provider  acetaminophen  (TYLENOL ) 500 MG tablet Take 2 tablets (1,000 mg total) by mouth every 8 (eight) hours as needed for moderate pain (pain score 4-6), fever, mild pain (pain score 1-3) or  headache. 05/31/23   Nivia Colon, PA-C  amLODipine  (NORVASC ) 5 MG tablet Take 1 tablet (5 mg total) by mouth daily. 08/29/23   Newlin, Enobong, MD  Blood Glucose Monitoring Suppl (BLOOD GLUCOSE MONITOR SYSTEM) w/Device KIT Use as directed to check blood sugar three times per day. 06/03/23   Elgergawy, Brayton RAMAN, MD  Camphor-Menthol-Methyl Sal (TIGER BALM MUSCLE RUB EX) Apply 1 Application topically as needed.    [provider]  Continuous Glucose Receiver (FREESTYLE LIBRE 3 READER) DEVI Use to check blood glucose continuously throughout the day. 08/29/23   Newlin, Enobong, MD  Continuous Glucose Sensor (FREESTYLE LIBRE 3 PLUS SENSOR) MISC Use to check blood glucose continuously throughout the day. Change sensor every 15 days. 08/29/23   Newlin, Enobong, MD  GENVOYA  150-150-200-10 MG  TABS tablet TAKE 1 TABLET BY MOUTH DAILY WITH BREAKFAST 09/11/23   Calone, Gregory D, FNP  Glucose Blood (BLOOD GLUCOSE TEST STRIPS) STRP Use as directed 3 times daily to check blood sugar 06/03/23   Elgergawy, Brayton RAMAN, MD  insulin  glargine (LANTUS ) 100 UNIT/ML Solostar Pen Inject 22 Units into the skin daily. 08/29/23   Newlin, Enobong, MD  insulin  lispro (HUMALOG ) 100 UNIT/ML KwikPen Inject 0-12 Units into the skin with breakfast, with lunch, and with evening meal. As per sliding scale.For blood sugars 0-150 give 0 units of insulin , 151-200 give 2 units of insulin , 201-250 give 4 units, 251-300 give 6 units, 301-350 give 8 units, 351-400 give 10 units,> 400 give 12 units 08/29/23   Newlin, Enobong, MD  Insulin  Pen Needle 31G X 8 MM MISC Use as directed three times daily. 06/03/23   Elgergawy, Brayton RAMAN, MD  Lancet Device MISC Use as directed three times daily. 06/03/23   Elgergawy, Brayton RAMAN, MD  Lancets MISC Use 3 (three) times daily to check blood sugar as directed. 06/03/23   Elgergawy, Brayton RAMAN, MD  mirtazapine  (REMERON ) 15 MG tablet Take 0.5 tablets (7.5 mg total) by mouth at bedtime. 08/29/23   Newlin, Enobong, MD  ondansetron   (ZOFRAN ) 4 MG tablet Take 1 tablet (4 mg total) by mouth every 8 (eight) hours as needed for nausea or vomiting. 05/31/23   Nivia Colon, PA-C  sertraline  (ZOLOFT ) 100 MG tablet Take 1.5 tablets (150 mg total) by mouth daily. 08/29/23   Newlin, Enobong, MD  valsartan  (DIOVAN ) 80 MG tablet Take 1 tablet (80 mg total) by mouth daily. 08/29/23   Newlin, Enobong, MD  pravastatin  (PRAVACHOL ) 20 MG tablet Take 1 tablet (20 mg total) by mouth daily. Patient not taking: Reported on 03/31/2019 03/08/19 07/08/19  Theotis Haze ORN, NP    ___________________________________________________________________________________________________ Physical Exam:    10/16/2023   11:00 PM 10/16/2023    9:41 PM 08/29/2023   12:16 PM  Vitals with BMI  Height  6' 0   Weight  190 lbs   BMI  25.76   Systolic 169 168 843  Diastolic 101 120 94  Pulse 85 73      1. General:  in No  Acute distress    Chronically ill   -appearing 2. Psychological: Alert and   Oriented 3. Head/ENT:    Dry Mucous Membranes                          Head Non traumatic, neck supple                      Poor Dentition 4. SKIN:  decreased Skin turgor,  Skin clean Dry and intact no rash    5. Heart: Regular rate and rhythm no  Murmur, no Rub or gallop 6. Lungs:  no wheezes or crackles   7. Abdomen: Soft,  non-tender, Non distended bowel sounds present 8. Lower extremities: no clubbing, cyanosis, no  edema 9. Neurologically Grossly intact, moving all 4 extremities equally  10. MSK: Normal range of motion    Chart has been reviewed  ______________________________________________________________________________________________  Assessment/Plan  38 y.o. male with medical history significant of DM1, HTN  HIV, CKD, asthma  Admitted for   Hypoglycemia   Present on Admission:  Hypoglycemia  Essential hypertension  CKD (chronic kidney disease) stage 2, GFR 60-89 ml/min  HIV (human immunodeficiency virus infection) (HCC)  Asthma  Type 1  diabetes (  HCC)     Hypoglycemia Difficult to manage brittle diabetic Order CBG every 2 hours D5 half-normal while intermittently hypoglycemic until stabilized Blood sugar still continues to be rapidly down trending Patient took Lantus  22 units in the morning With poor p.o. intake throughout the day Once blood sugar stabilizes will need to restart insulin  as patient is type I diabetic to avoid DKA may need to choose a lower dose and/or start with sliding scale  Essential hypertension Continue Norvasc  5 mg a day given worsening renal function hold off on Diovan   CKD (chronic kidney disease) stage 2, GFR 60-89 ml/min  -chronic avoid nephrotoxic medications such as NSAIDs, Vanco Zosyn  combo,  avoid hypotension, continue to follow renal function   HIV (human immunodeficiency virus infection) (HCC) Check CD4 count and HIV viral load  Asthma Continue home medications  Type 1 diabetes (HCC) Appreciate diabetes coordinator consult Monitor blood sugar carefully for tonight If remains stable would restart sliding scale if remains hypoglycemic hold off and continue D5 half-normal saline Patient will greatly benefit from endocrinology follow-up as an outpatient and perhaps insulin  pump    Other plan as per orders.  DVT prophylaxis:  SCD      Code Status:    Code Status: Prior FULL CODE  as per patient   I had personally discussed CODE STATUS with patient   ACP   none    Family Communication:   Family not at  Bedside    Diet regular   Disposition Plan:     To home once workup is complete and patient is stable   Following barriers for discharge:                             Hypoglycemia resolved                                   Consult Orders  (From admission, onward)           Start     Ordered   10/16/23 2317  Consult to hospitalist  Paged By Alfonso  Once       Provider:  (Not yet assigned)  Question Answer Comment  Place call to: [facility]   Reason for  Consult Admit      10/16/23 2316                              Diabetes care coordinator                   Transition of care consulted                                     Consults called:    NONE   Admission status:  ED Disposition     ED Disposition  Admit   Condition  --   Comment  Hospital Area: [facility] [[id number]]  Level of Care: Progressive [102]  Admit to Progressive based on following criteria: GI, ENDOCRINE disease patients with GI bleeding, acute liver failure or pancreatitis, stable with diabetic ketoacidosis or thyrotoxicosis (hypothyroid) state.  May place patient in observation at [facility] or [facility] if equivalent level of care is available:: No  Covid Evaluation: Asymptomatic - no  recent exposure (last 10 days) testing not required  Diagnosis: Hypoglycemia [757795]  Admitting Physician: Jomari Bartnik [[id number]]  Attending Physician: Calixto Pavel [[id number]]  For patients discharging to extended facilities (i.e. SNF, AL, group homes or LTAC) initiate:: Discharge to SNF/Facility Placement COVID-19 Lab Testing Protocol           Obs     Level of care        progressive     Dallin Mccorkel 10/16/2023, 1:02 AM    [facility]     after 2 AM please page floor coverage   If 7AM-7PM, please contact the day team taking care of the patient using [email or web address]

## 2023-10-16 NOTE — Subjective & Objective (Signed)
 Pt coming from [facility] with confusion diaphoresis and found to have hypoglycemia Patient is diabetic took his insulin  and try to eat but found to have CBG of 30 Improved up to 111 but after D10 and free glasses of orange juice But then in the ER plummeted back down to 25 Patient is type I diabetic He is not on insulin  pump uses insulin  instead Supposed to be on 22 units of Lantus  And then sliding scale and meals

## 2023-10-16 NOTE — ED Notes (Signed)
 [facility] called

## 2023-10-16 NOTE — H&P (Incomplete)
 [name]:[id number] DOB: 1985-06-28 DOA: 10/16/2023     PCP: Delbert Clam, MD   Patient arrived to ER on 10/16/23 at 2134 Referred by Attending Theadore Ozell HERO, MD   Patient coming from:   From [facility]    Chief Complaint:  Chief Complaint  Patient presents with  . Hypoglycemia    HPI: [name] is a 38 y.o. male with medical history significant of DM1, HTN    Presented with  hypoglycemia Pt coming from [facility] with confusion diaphoresis and found to have hypoglycemia Patient is diabetic took his insulin  and try to eat but found to have CBG of 30 Improved up to 111 but after D10 and free glasses of orange juice But then in the ER plummeted back down to 25 Patient is type I diabetic He is not on insulin  pump uses insulin  instead Supposed to be on 22 units of Lantus  And then sliding scale and meals     Denies significant ETOH intake *** Does not smoke*** but interested in quitting***  Denies marijuana use ***    Regarding pertinent Chronic problems:    Hyperlipidemia -  on statins Pravachol  (pravastatin )  Lipid Panel     Component Value Date/Time   CHOL 191 03/24/2023 0927   CHOL 169 02/10/2022 1223   TRIG 69 03/24/2023 0927   HDL 95 03/24/2023 0927   HDL 99 02/10/2022 1223   CHOLHDL 2.0 03/24/2023 0927   VLDL 10 09/24/2020 1434   LDLCALC 81 03/24/2023 0927   LABVLDL 14 02/10/2022 1223    HTN on Norvasc , Diovan ,       DM 1 -  Lab Results  Component Value Date   HGBA1C 9.4 (H) 06/01/2023   on insulin ,    *** Asthma -well *** controlled on home inhalers/ nebs                     *** COPD - not **followed by pulmonology *** not  on baseline oxygen  *L,    *** OSA -on nocturnal oxygen, *CPAP, *noncompliant with CPAP  *** Hx of CVA - *with/out residual deficits on Aspirin  81 mg, 325, Plavix  ***A. Fib -   atrial fibrillation CHA2DS2 vas score **** CHA2DS2/VAS Stroke Risk Points       N/A >= 2 Points: High Risk  1 to 1.99 Points: Medium Risk  0 Points: Low Risk    Last Change: N/A      This score determines the patient's risk of having a stroke if the  patient has atrial fibrillation.      This score is not applicable to this patient. Components are not  calculated.     current  on anticoagulation with ****Coumadin  ***Xarelto ,* Eliquis,  *** Not on anticoagulation secondary to Risk of Falls, *** recurrent bleeding         -  Rate control:  Currently controlled with ***Toprolol,  *Metoprolol,* Diltiazem, *Coreg          - Rhythm control: *** amiodarone, *flecainide  ***Hx of DVT/PE on - anticoagulation with ****Coumadin  ***Xarelto ,* Eliquis,      CKD stage IIIb baseline Cr 1.7 Estimated Creatinine Clearance: 69.8 mL/min (A) (by C-G formula based on SCr of 1.59 mg/dL (H)).  Lab Results  Component Value Date   CREATININE 1.59 (H) 10/16/2023   CREATININE 1.71 (H) 09/04/2023   CREATININE 1.94 (H) 06/03/2023   Lab Results  Component Value Date  NA 141 10/16/2023   CL 104 10/16/2023   K 4.5 10/16/2023   CO2 26 10/16/2023   BUN 17 10/16/2023   CREATININE 1.59 (H) 10/16/2023   GFRNONAA 57 (L) 10/16/2023   CALCIUM  9.7 10/16/2023   PHOS 3.9 06/03/2023   ALBUMIN 3.8 05/31/2023   GLUCOSE 46 (L) 10/16/2023    While in ER:   Found to be persistently hypoglycemic     Lab Orders         Basic metabolic panel         CBC with Differential         CBG monitoring, ED         CBG monitoring, ED      CT HEAD *** NON acute   MRI brain  ***no acute CVA  CXR - ***NON acute  CTabd/pelvis - ***nonacute  CTA chest - ***nonacute, no PE, * no evidence of infiltrate  Following Medications were ordered in ER: Medications  ondansetron  (ZOFRAN ) injection 4 mg (4 mg Intravenous Given 10/16/23 2229)  dextrose  50 % solution 25 g (25 g Intravenous Given 10/16/23 2311)  dextrose  5 % bolus 1,000 mL (1,000 mLs Intravenous New Bag/Given 10/16/23 2312)  dextrose   50 % solution (50 mLs  Given 10/16/23 2319)    ______________    ED Triage Vitals  Encounter Vitals Group     BP 10/16/23 2141 (!) 168/120     Girls Systolic BP Percentile --      Girls Diastolic BP Percentile --      Boys Systolic BP Percentile --      Boys Diastolic BP Percentile --      Pulse Rate 10/16/23 2141 73     Resp 10/16/23 2141 17     Temp 10/16/23 2138 (!) 97.4 F (36.3 C)     Temp Source 10/16/23 2138 Oral     SpO2 10/16/23 2141 100 %     Weight 10/16/23 2141 190 lb (86.2 kg)     Height 10/16/23 2141 6' (1.829 m)     Head Circumference --      Peak Flow --      Pain Score 10/16/23 2140 7     Pain Loc --      Pain Education --      Exclude from Growth Chart --   UFJK(75)@     _________________________________________ Significant initial  Findings: Abnormal Labs Reviewed  BASIC METABOLIC PANEL WITH GFR - Abnormal; Notable for the following components:      Result Value   Glucose, Bld 46 (*)    Creatinine, Ser 1.59 (*)    GFR, Estimated 57 (*)    All other components within normal limits  CBG MONITORING, ED - Abnormal; Notable for the following components:   Glucose-Capillary 147 (*)    All other components within normal limits  CBG MONITORING, ED - Abnormal; Notable for the following components:   Glucose-Capillary 25 (*)    All other components within normal limits      _________________________ Troponin ***ordered Cardiac Panel (last 3 results) No results for input(s): CKTOTAL, CKMB, TROPONINIHS, RELINDX in the last 72 hours.   ECG: Ordered Personally reviewed and interpreted by me showing: HR : *** Rhythm: *NSR, Sinus tachycardia * A.fib. W RVR, RBBB, LBBB, Paced Ischemic changes*nonspecific changes, no evidence of ischemic changes QTC*  BNP (last 3 results) No results for input(s): BNP in the last 8760 hours.    No results for input(s): DDIMER, FERRITIN, LDH, CRP in the  last 72 hours.    ____________________ This patient  meets SIRS Criteria and may be septic. SIRS = Systemic Inflammatory Response Syndrome  Order a lactic acid level if needed AND/OR Initiate the sepsis protocol with the attached order set OR Click Treating Associated Infection or Illness if the patient is being treated for an infection that is a known cause of these abnormalities     The recent clinical data is shown below. Vitals:   10/16/23 2138 10/16/23 2141 10/16/23 2300  BP:  (!) 168/120 (!) 169/101  Pulse:  73 85  Resp:  17 (!) 24  Temp: (!) 97.4 F (36.3 C)    TempSrc: Oral    SpO2:  100% 100%  Weight:  86.2 kg   Height:  6' (1.829 m)         WBC     Component Value Date/Time   WBC 9.5 10/16/2023 2209   LYMPHSABS 1.4 10/16/2023 2209   MONOABS 0.7 10/16/2023 2209   EOSABS 0.0 10/16/2023 2209   BASOSABS 0.0 10/16/2023 2209        Lactic Acid, Venous    Component Value Date/Time   LATICACIDVEN 1.3 10/01/2022 0858     Lactic Acid, Venous    Component Value Date/Time   LATICACIDVEN 1.3 10/01/2022 0858    Procalcitonin *** Ordered      UA *** no evidence of UTI  ***Pending ***not ordered   Urine analysis:    Component Value Date/Time   COLORURINE YELLOW 05/31/2023 1417   APPEARANCEUR CLEAR 05/31/2023 1417   LABSPEC 1.022 05/31/2023 1417   PHURINE 7.0 05/31/2023 1417   GLUCOSEU NEGATIVE 05/31/2023 1417   HGBUR NEGATIVE 05/31/2023 1417   BILIRUBINUR NEGATIVE 05/31/2023 1417   KETONESUR 20 (A) 05/31/2023 1417   PROTEINUR >=300 (A) 05/31/2023 1417   UROBILINOGEN 0.2 02/10/2015 1416   NITRITE NEGATIVE 05/31/2023 1417   LEUKOCYTESUR NEGATIVE 05/31/2023 1417    Results for orders placed or performed during the hospital encounter of 06/01/23  Resp panel by RT-PCR (RSV, Flu A&B, Covid) Anterior Nasal Swab     Status: None   Collection Time: 06/01/23  5:27 AM   Specimen: Anterior Nasal Swab  Result Value Ref Range Status   SARS Coronavirus 2 by RT PCR NEGATIVE NEGATIVE Final   Influenza A by PCR  NEGATIVE NEGATIVE Final   Influenza B by PCR NEGATIVE NEGATIVE Final    Comment: (NOTE) The Xpert Xpress SARS-CoV-2/FLU/RSV plus assay is intended as an aid in the diagnosis of influenza from Nasopharyngeal swab specimens and should not be used as a sole basis for treatment. Nasal washings and aspirates are unacceptable for Xpert Xpress SARS-CoV-2/FLU/RSV testing.  Fact Sheet for Patients: BloggerCourse.com  Fact Sheet for Healthcare Providers: SeriousBroker.it  This test is not yet approved or cleared by the [place]  FDA and has been authorized for detection and/or diagnosis of SARS-CoV-2 by FDA under an Emergency Use Authorization (EUA). This EUA will remain in effect (meaning this test can be used) for the duration of the COVID-19 declaration under Section 564(b)(1) of the Act, 21 U.S.C. section 360bbb-3(b)(1), unless the authorization is terminated or revoked.     Resp Syncytial Virus by PCR NEGATIVE NEGATIVE Final    Comment: (NOTE) Fact Sheet for Patients: BloggerCourse.com  Fact Sheet for Healthcare Providers: SeriousBroker.it  This test is not yet approved or cleared by the [place]  FDA and has been authorized for detection and/or diagnosis of SARS-CoV-2 by FDA under an Emergency Use Authorization (EUA). This EUA  will remain in effect (meaning this test can be used) for the duration of the COVID-19 declaration under Section 564(b)(1) of the Act, 21 U.S.C. section 360bbb-3(b)(1), unless the authorization is terminated or revoked.  Performed at [facility], [address] [place], [place] [address]   Culture, blood (Routine X 2) w Reflex to ID Panel     Status: None   Collection Time: 06/01/23 12:29 PM   Specimen: BLOOD LEFT HAND  Result Value Ref Range Status   Specimen Description BLOOD LEFT HAND  Final   Special Requests   Final    BOTTLES DRAWN  AEROBIC AND ANAEROBIC Blood Culture adequate volume   Culture   Final    NO GROWTH 5 DAYS Performed at [facility], [address] [place], [place] [address]    Report Status 06/06/2023 FINAL  Final  Culture, blood (Routine X 2) w Reflex to ID Panel     Status: None   Collection Time: 06/01/23 12:29 PM   Specimen: BLOOD LEFT HAND  Result Value Ref Range Status   Specimen Description BLOOD LEFT HAND  Final   Special Requests   Final    BOTTLES DRAWN AEROBIC AND ANAEROBIC Blood Culture adequate volume   Culture   Final    NO GROWTH 5 DAYS Performed at [facility], [address] [place], [place] [address]    Report Status 06/06/2023 FINAL  Final    ABX started Antibiotics Given (last 72 hours)     None        No results found for the last 90 days.    ________________________________________________________________  Arterial ***Venous  Blood Gas result:  pH *** pCO2 ***; pO2 ***;     %O2 Sat ***.  ABG    Component Value Date/Time   PHART 7.195 (LL) 11/30/2012 1633   PCO2ART 21.7 (L) 11/30/2012 1633   PO2ART 116.0 (H) 11/30/2012 1633   HCO3 14.2 (L) 06/01/2023 0715   TCO2 15 (L) 06/01/2023 0715   ACIDBASEDEF 10.0 (H) 06/01/2023 0715   O2SAT 99 06/01/2023 0715       __________________________________________________________ Recent Labs  Lab 10/16/23 2209  NA 141  K 4.5  CO2 26  GLUCOSE 46*  BUN 17  CREATININE 1.59*  CALCIUM  9.7    Cr  * stable,  Up from baseline see below Lab Results  Component Value Date   CREATININE 1.59 (H) 10/16/2023   CREATININE 1.71 (H) 09/04/2023   CREATININE 1.94 (H) 06/03/2023    No results for input(s): AST, ALT, ALKPHOS, BILITOT, PROT, ALBUMIN in the last 168 hours. Lab Results  Component Value Date   CALCIUM  9.7 10/16/2023   PHOS 3.9 06/03/2023          Plt: Lab Results  Component Value Date   PLT 348 10/16/2023         Recent Labs  Lab 10/16/23 2209  WBC 9.5  NEUTROABS 7.4   HGB 16.1  HCT 48.3  MCV 92.5  PLT 348    HG/HCT * stable,  Down *Up from baseline see below    Component Value Date/Time   HGB 16.1 10/16/2023 2209   HCT 48.3 10/16/2023 2209   MCV 92.5 10/16/2023 2209      No results for input(s): LIPASE, AMYLASE in the last 168 hours. No results for input(s): AMMONIA in the last 168 hours.    _______________________________________________ Hospitalist was called for admission for *** Hypoglycemia ***  Nausea ***  The following Work up has been ordered so far:  Orders Placed This Encounter  Procedures  . Basic metabolic panel  . CBC with Differential  . ED Cardiac monitoring  . Consult to hospitalist  . CBG monitoring, ED  . CBG monitoring, ED  . ED EKG  . EKG 12-Lead  . EKG 12-Lead     OTHER Significant initial  Findings:  labs showing:     DM  labs:  HbA1C: Recent Labs    10/31/22 0845 06/01/23 1229  HGBA1C 8.6* 9.4*       CBG (last 3)  Recent Labs    10/16/23 2138 10/16/23 2307  GLUCAP 147* 25*          Cultures:    Component Value Date/Time   SDES BLOOD LEFT HAND 06/01/2023 1229   SDES BLOOD LEFT HAND 06/01/2023 1229   SPECREQUEST  06/01/2023 1229    BOTTLES DRAWN AEROBIC AND ANAEROBIC Blood Culture adequate volume   SPECREQUEST  06/01/2023 1229    BOTTLES DRAWN AEROBIC AND ANAEROBIC Blood Culture adequate volume   CULT  06/01/2023 1229    NO GROWTH 5 DAYS Performed at [facility], [address] [place], [place] [address]    CULT  06/01/2023 1229    NO GROWTH 5 DAYS Performed at [facility], [address] [place], [place] [address]    REPTSTATUS 06/06/2023 FINAL 06/01/2023 1229   REPTSTATUS 06/06/2023 FINAL 06/01/2023 1229     Radiological Exams on Admission: No results found. _______________________________________________________________________________________________________ Latest  Blood pressure (!) 169/101, pulse 85, temperature (!) 97.4 F (36.3 C),  temperature source Oral, resp. rate (!) 24, height 6' (1.829 m), weight 86.2 kg, SpO2 100%.   Vitals  labs and radiology finding personally reviewed  Review of Systems:    Pertinent positives include: ***  Constitutional:  No weight loss, night sweats, Fevers, chills, fatigue, weight loss  HEENT:  No headaches, Difficulty swallowing,Tooth/dental problems,Sore throat,  No sneezing, itching, ear ache, nasal congestion, post nasal drip,  Cardio-vascular:  No chest pain, Orthopnea, PND, anasarca, dizziness, palpitations.no Bilateral lower extremity swelling  GI:  No heartburn, indigestion, abdominal pain, nausea, vomiting, diarrhea, change in bowel habits, loss of appetite, melena, blood in stool, hematemesis Resp:  no shortness of breath at rest. No dyspnea on exertion, No excess mucus, no productive cough, No non-productive cough, No coughing up of blood.No change in color of mucus.No wheezing. Skin:  no rash or lesions. No jaundice GU:  no dysuria, change in color of urine, no urgency or frequency. No straining to urinate.  No flank pain.  Musculoskeletal:  No joint pain or no joint swelling. No decreased range of motion. No back pain.  Psych:  No change in mood or affect. No depression or anxiety. No memory loss.  Neuro: no localizing neurological complaints, no tingling, no weakness, no double vision, no gait abnormality, no slurred speech, no confusion  All systems reviewed and apart from HOPI all are negative _______________________________________________________________________________________________ Past Medical History:   Past Medical History:  Diagnosis Date  . Anxiety   . Asthma   . Closed displaced comminuted fracture of shaft of right tibia 11/01/2017  . Closed fracture of base of fifth metatarsal bone of right foot 11/13/2017  . Closed fracture of medial malleolus of right ankle 10/31/2017  . Depression   . Headache    only w/stress (11/01/2017)  . HIV (human  immunodeficiency virus infection) (HCC)   . Hypoglycemia due to insulin  06/27/2022  .  Migraine    a few/month (11/01/2017)  . Refusal of blood transfusions as patient is Jehovah's Witness   . Seizure (HCC)    only w/my low blood sugars  (11/01/2017)  . Type I diabetes mellitus (HCC)       Past Surgical History:  Procedure Laterality Date  . FRACTURE SURGERY    . IM NAILING TIBIA Right 11/01/2017   INTRAMEDULLARY (IM) NAIL TIBIALRightGeneral  . ORIF ANKLE FRACTURE Right 11/01/2017  . ORIF ANKLE FRACTURE Right 11/01/2017   Procedure: OPEN REDUCTION INTERNAL FIXATION (ORIF) ANKLE FRACTURE;  Surgeon: Kendal Franky SQUIBB, MD;  Location: [facility] OR;  Service: Orthopedics;  Laterality: Right;  . TIBIA IM NAIL INSERTION Right 11/01/2017   Procedure: INTRAMEDULLARY (IM) NAIL TIBIAL;  Surgeon: Kendal Franky SQUIBB, MD;  Location: [facility] OR;  Service: Orthopedics;  Laterality: Right;    Social History:  Ambulatory *** independently cane, walker  wheelchair bound, bed bound     reports that he quit smoking about 10 months ago. His smoking use included cigars. He started smoking about 8 years ago. He has never used smokeless tobacco. He reports that he does not currently use alcohol after a past usage of about 7.0 standard drinks of alcohol per week. He reports that he does not currently use drugs after having used the following drugs: Marijuana. Frequency: 2.00 times per week.     Family History: *** Family History  Problem Relation Age of Onset  . Thyroid  disease Mother   . Diabetes Mother   . Breast cancer Maternal Grandmother   . Diabetes Maternal Grandmother    ______________________________________________________________________________________________ Allergies: Allergies  Allergen Reactions  . Shellfish Allergy Anaphylaxis and Hives  . Aspirin  Other (See Comments)    Lump in armpit     Prior to Admission medications   Medication Sig Start Date End Date Taking? Authorizing Provider   acetaminophen  (TYLENOL ) 500 MG tablet Take 2 tablets (1,000 mg total) by mouth every 8 (eight) hours as needed for moderate pain (pain score 4-6), fever, mild pain (pain score 1-3) or headache. 05/31/23   Nivia Colon, PA-C  amLODipine  (NORVASC ) 5 MG tablet Take 1 tablet (5 mg total) by mouth daily. 08/29/23   Newlin, Enobong, MD  Blood Glucose Monitoring Suppl (BLOOD GLUCOSE MONITOR SYSTEM) w/Device KIT Use as directed to check blood sugar three times per day. 06/03/23   Elgergawy, Brayton RAMAN, MD  Camphor-Menthol-Methyl Sal (TIGER BALM MUSCLE RUB EX) Apply 1 Application topically as needed.    [provider]  Continuous Glucose Receiver (FREESTYLE LIBRE 3 READER) DEVI Use to check blood glucose continuously throughout the day. 08/29/23   Newlin, Enobong, MD  Continuous Glucose Sensor (FREESTYLE LIBRE 3 PLUS SENSOR) MISC Use to check blood glucose continuously throughout the day. Change sensor every 15 days. 08/29/23   Newlin, Enobong, MD  GENVOYA  150-150-200-10 MG TABS tablet TAKE 1 TABLET BY MOUTH DAILY WITH BREAKFAST 09/11/23   Calone, Gregory D, FNP  Glucose Blood (BLOOD GLUCOSE TEST STRIPS) STRP Use as directed 3 times daily to check blood sugar 06/03/23   Elgergawy, Brayton RAMAN, MD  insulin  glargine (LANTUS ) 100 UNIT/ML Solostar Pen Inject 22 Units into the skin daily. 08/29/23   Newlin, Enobong, MD  insulin  lispro (HUMALOG ) 100 UNIT/ML KwikPen Inject 0-12 Units into the skin with breakfast, with lunch, and with evening meal. As per sliding scale.For blood sugars 0-150 give 0 units of insulin , 151-200 give 2 units of insulin , 201-250 give 4 units, 251-300 give 6 units, 301-350 give 8  units, 351-400 give 10 units,> 400 give 12 units 08/29/23   Newlin, Enobong, MD  Insulin  Pen Needle 31G X 8 MM MISC Use as directed three times daily. 06/03/23   Elgergawy, Brayton RAMAN, MD  Lancet Device MISC Use as directed three times daily. 06/03/23   Elgergawy, Brayton RAMAN, MD  Lancets MISC Use 3 (three) times daily to check blood  sugar as directed. 06/03/23   Elgergawy, Brayton RAMAN, MD  mirtazapine  (REMERON ) 15 MG tablet Take 0.5 tablets (7.5 mg total) by mouth at bedtime. 08/29/23   Newlin, Enobong, MD  ondansetron  (ZOFRAN ) 4 MG tablet Take 1 tablet (4 mg total) by mouth every 8 (eight) hours as needed for nausea or vomiting. 05/31/23   Nivia Colon, PA-C  sertraline  (ZOLOFT ) 100 MG tablet Take 1.5 tablets (150 mg total) by mouth daily. 08/29/23   Newlin, Enobong, MD  valsartan  (DIOVAN ) 80 MG tablet Take 1 tablet (80 mg total) by mouth daily. 08/29/23   Newlin, Enobong, MD  pravastatin  (PRAVACHOL ) 20 MG tablet Take 1 tablet (20 mg total) by mouth daily. Patient not taking: Reported on 03/31/2019 03/08/19 07/08/19  Theotis Haze ORN, NP    ___________________________________________________________________________________________________ Physical Exam:    10/16/2023   11:00 PM 10/16/2023    9:41 PM 08/29/2023   12:16 PM  Vitals with BMI  Height  6' 0   Weight  190 lbs   BMI  25.76   Systolic 169 168 843  Diastolic 101 120 94  Pulse 85 73      1. General:  in No ***Acute distress***increased work of breathing ***complaining of severe pain****agitated * Chronically ill *well *cachectic *toxic acutely ill -appearing 2. Psychological: Alert and *** Oriented 3. Head/ENT:   Moist *** Dry Mucous Membranes                          Head Non traumatic, neck supple                          Normal *** Poor Dentition 4. SKIN: normal *** decreased Skin turgor,  Skin clean Dry and intact no rash    5. Heart: Regular rate and rhythm no*** Murmur, no Rub or gallop 6. Lungs: ***Clear to auscultation bilaterally, no wheezes or crackles   7. Abdomen: Soft, ***non-tender, Non distended *** obese ***bowel sounds present 8. Lower extremities: no clubbing, cyanosis, no ***edema 9. Neurologically Grossly intact, moving all 4 extremities equally *** strength 5 out of 5 in all 4 extremities cranial nerves II through XII intact 10. MSK: Normal range  of motion    Chart has been reviewed  ______________________________________________________________________________________________  Assessment/Plan  ***  Admitted for *** Hypoglycemia ***  Nausea ***    Present on Admission: **None**     No problem-specific Assessment & Plan notes found for this encounter.    Other plan as per orders.  DVT prophylaxis:  SCD *** Lovenox        Code Status:    Code Status: Prior FULL CODE *** DNR/DNI ***comfort care as per patient ***family  I had personally discussed CODE STATUS with patient and family*  ACP *** none has been reviewed ***   Family Communication:   Family not at  Bedside  plan of care was discussed on the phone with *** Son, Daughter, Wife, Husband, Sister, Brother , father, mother  Diet    Disposition Plan:   *** likely will need placement for  rehabilitation                          Back to current facility when stable                            To home once workup is complete and patient is stable  ***Following barriers for discharge:                             Chest pain *** Stroke *** Syncope ***work up is complete                            Electrolytes corrected                               Anemia corrected h/H stable                             Pain controlled with PO medications                               Afebrile, white count improving able to transition to PO antibiotics                             Will need to be able to tolerate PO                            Will likely need home health, home O2, set up                           Will need consultants to evaluate patient prior to discharge                           Work of breathing improves       Consult Orders  (From admission, onward)           Start     Ordered   10/16/23 2317  Consult to hospitalist  Paged By Alfonso  Once       Provider:  (Not yet assigned)  Question Answer Comment  Place call to: [facility]   Reason  for Consult Admit      10/16/23 2316                              ***Would benefit from PT/OT eval prior to DC  Ordered                   Swallow eval - SLP ordered                   Diabetes care coordinator                   Transition of care consulted                   Nutrition    consulted  Wound care  consulted                   Palliative care    consulted                   Behavioral health  consulted                    Consults called: ***  NONE   Admission status:  ED Disposition     None        Obs***  ***  inpatient     I Expect 2 midnight stay secondary to severity of patient's current illness need for inpatient interventions justified by the following: ***hemodynamic instability despite optimal treatment (tachycardia *hypotension * tachypnea *hypoxia, hypercapnia) *** Severe lab/radiological/exam abnormalities including:    Hypoglycemia ***  Nausea ***  and extensive comorbidities including: *substance abuse  *Chronic pain *DM2  * CHF * CAD  * COPD/asthma *Morbid Obesity * CKD *dementia *liver disease *history of stroke with residual deficits *  malignancy, * sickle cell disease  History of amputation Chronic anticoagulation  That are currently affecting medical management.   I expect  patient to be hospitalized for 2 midnights requiring inpatient medical care.  Patient is at high risk for adverse outcome (such as loss of life or disability) if not treated.  Indication for inpatient stay as follows:  Severe change from baseline regarding mental status Hemodynamic instability despite maximal medical therapy,  severe pain requiring acute inpatient management,  inability to maintain oral hydration   persistent chest pain despite medical management Need for operative/procedural  intervention New or worsening hypoxia ongoing suicidal ideations   Need for IV antibiotics, IV fluids,, IV pain medications, IV  anticoagulation,  IV rate controling medications, IV antihypertensives need for biPAP Frequent labs    Level of care   *** tele  For 12H 24H     medical floor       progressive     stepdown   tele indefinitely please discontinue once patient no longer qualifies COVID-19 Labs    Critical***  Patient is critically ill due to  hemodynamic instability * respiratory failure *severe sepsis* ongoing chest pain*  They are at high risk for life/limb threatening clinical deterioration requiring frequent reassessment and modifications of care.  Services provided include examination of the patient, review of relevant ancillary tests, prescription of lifesaving therapies, review of medications and prophylactic therapy.  Total critical care time excluding separately billable procedures: 60*  Minutes.    Kazimir Hartnett 10/16/2023, 11:32 PM ***  [facility]     after 2 AM please page floor coverage   If 7AM-7PM, please contact the day team taking care of the patient using [email or web address]

## 2023-10-16 NOTE — ED Provider Notes (Signed)
 [place] EMERGENCY DEPARTMENT AT [facility] Provider Note   CSN: [id number] Arrival date & time: 10/16/23  2134     Patient presents with: Hypoglycemia   [name] is a 38 y.o. male.   38 year old male presents for evaluation of hyperglycemia.  States that he felt like his sugar dropped while he was at the drinks tonight.  States he ate when EMS got there, sugar was initially 30 but has improved since.  States he still feels nauseous and a bit fatigued.  He states he has had soft stools for the last few days but no diarrhea and no vomiting.  He is a type I diabetic and uses insulin  but not on insulin  pump.  Denies any other symptoms or concerns at this time.   Hypoglycemia Associated symptoms: no seizures, no shortness of breath and no vomiting        Prior to Admission medications   Medication Sig Start Date End Date Taking? Authorizing Provider  acetaminophen  (TYLENOL ) 500 MG tablet Take 2 tablets (1,000 mg total) by mouth every 8 (eight) hours as needed for moderate pain (pain score 4-6), fever, mild pain (pain score 1-3) or headache. 05/31/23   Nivia Colon, PA-C  amLODipine  (NORVASC ) 5 MG tablet Take 1 tablet (5 mg total) by mouth daily. 08/29/23   Newlin, Enobong, MD  Blood Glucose Monitoring Suppl (BLOOD GLUCOSE MONITOR SYSTEM) w/Device KIT Use as directed to check blood sugar three times per day. 06/03/23   Elgergawy, Brayton RAMAN, MD  Camphor-Menthol-Methyl Sal (TIGER BALM MUSCLE RUB EX) Apply 1 Application topically as needed.    [provider]  Continuous Glucose Receiver (FREESTYLE LIBRE 3 READER) DEVI Use to check blood glucose continuously throughout the day. 08/29/23   Newlin, Enobong, MD  Continuous Glucose Sensor (FREESTYLE LIBRE 3 PLUS SENSOR) MISC Use to check blood glucose continuously throughout the day. Change sensor every 15 days. 08/29/23   Newlin, Enobong, MD  GENVOYA  150-150-200-10 MG TABS tablet TAKE 1 TABLET BY MOUTH DAILY WITH BREAKFAST  09/11/23   Calone, Gregory D, FNP  Glucose Blood (BLOOD GLUCOSE TEST STRIPS) STRP Use as directed 3 times daily to check blood sugar 06/03/23   Elgergawy, Brayton RAMAN, MD  insulin  glargine (LANTUS ) 100 UNIT/ML Solostar Pen Inject 22 Units into the skin daily. 08/29/23   Newlin, Enobong, MD  insulin  lispro (HUMALOG ) 100 UNIT/ML KwikPen Inject 0-12 Units into the skin with breakfast, with lunch, and with evening meal. As per sliding scale.For blood sugars 0-150 give 0 units of insulin , 151-200 give 2 units of insulin , 201-250 give 4 units, 251-300 give 6 units, 301-350 give 8 units, 351-400 give 10 units,> 400 give 12 units 08/29/23   Newlin, Enobong, MD  Insulin  Pen Needle 31G X 8 MM MISC Use as directed three times daily. 06/03/23   Elgergawy, Brayton RAMAN, MD  Lancet Device MISC Use as directed three times daily. 06/03/23   Elgergawy, Brayton RAMAN, MD  Lancets MISC Use 3 (three) times daily to check blood sugar as directed. 06/03/23   Elgergawy, Brayton RAMAN, MD  mirtazapine  (REMERON ) 15 MG tablet Take 0.5 tablets (7.5 mg total) by mouth at bedtime. 08/29/23   Newlin, Enobong, MD  ondansetron  (ZOFRAN ) 4 MG tablet Take 1 tablet (4 mg total) by mouth every 8 (eight) hours as needed for nausea or vomiting. 05/31/23   Nivia Colon, PA-C  sertraline  (ZOLOFT ) 100 MG tablet Take 1.5 tablets (150 mg total) by mouth daily. 08/29/23   Newlin, Enobong, MD  valsartan  (DIOVAN ) 80 MG tablet Take 1 tablet (80 mg total) by mouth daily. 08/29/23   Newlin, Enobong, MD  pravastatin  (PRAVACHOL ) 20 MG tablet Take 1 tablet (20 mg total) by mouth daily. Patient not taking: Reported on 03/31/2019 03/08/19 07/08/19  Fleming, Zelda W, NP    Allergies: Shellfish allergy and Aspirin     Review of Systems  Constitutional:  Positive for fatigue. Negative for chills and fever.  HENT:  Negative for ear pain and sore throat.   Eyes:  Negative for pain and visual disturbance.  Respiratory:  Negative for cough and shortness of breath.   Cardiovascular:  Negative for  chest pain and palpitations.  Gastrointestinal:  Positive for nausea. Negative for abdominal pain and vomiting.  Genitourinary:  Negative for dysuria and hematuria.  Musculoskeletal:  Negative for arthralgias and back pain.  Skin:  Negative for color change and rash.  Neurological:  Negative for seizures and syncope.  All other systems reviewed and are negative.   Updated Vital Signs BP (!) 168/120   Pulse 73   Temp (!) 97.4 F (36.3 C) (Oral)   Resp 17   Ht 6' (1.829 m)   Wt 86.2 kg   SpO2 100%   BMI 25.77 kg/m   Physical Exam Vitals and nursing note reviewed.  Constitutional:      General: He is not in acute distress.    Appearance: Normal appearance. He is well-developed. He is not ill-appearing.  HENT:     Head: Normocephalic and atraumatic.  Eyes:     Conjunctiva/sclera: Conjunctivae normal.  Cardiovascular:     Rate and Rhythm: Normal rate and regular rhythm.     Heart sounds: No murmur heard. Pulmonary:     Effort: Pulmonary effort is normal. No respiratory distress.     Breath sounds: Normal breath sounds.  Abdominal:     Palpations: Abdomen is soft.     Tenderness: There is no abdominal tenderness.  Musculoskeletal:        General: No swelling.     Cervical back: Neck supple.  Skin:    General: Skin is warm and dry.     Capillary Refill: Capillary refill takes less than 2 seconds.  Neurological:     Mental Status: He is alert.  Psychiatric:        Mood and Affect: Mood normal.     (all labs ordered are listed, but only abnormal results are displayed) Labs Reviewed  CBG MONITORING, ED - Abnormal; Notable for the following components:      Result Value   Glucose-Capillary 147 (*)    All other components within normal limits  CBC WITH DIFFERENTIAL/PLATELET  BASIC METABOLIC PANEL WITH GFR    EKG: None  Radiology: No results found.   Procedures   Medications Ordered in the ED  ondansetron  (ZOFRAN ) injection 4 mg (4 mg Intravenous Given  10/16/23 2229)                                    Medical Decision Making Cardiac monitor interpretation: sinus rhythm, no ectopy   Patient here for hypoglycemia.  Glucose has improved after the patient ate.  Vitals are stable.  Workup is pending at this time.  I will give him some Zofran  for nausea.  Encouraged to eat and drink while he is here in the emergency department.  Patient signed out to oncoming provider at 11 PM pending remainder of  workup and ultimate disposition  Problems Addressed: Hypoglycemia: acute illness or injury Nausea: acute illness or injury  Amount and/or Complexity of Data Reviewed External Data Reviewed: notes.    Details: Outpatient records reviewed and patient follows up with internal medicine for his diabetes Labs: ordered. Decision-making details documented in ED Course.    Details: Ordered and reviewed by me and CBC is unremarkable, remainder of labs are pending ECG/medicine tests: ordered and independent interpretation performed. Decision-making details documented in ED Course.    Details: Ordered and interpreted by me in the absence of cardiology and shows sinus rhythm, no STEMI or acute change when compared to prior EKG  Risk OTC drugs. Prescription drug management. Drug therapy requiring intensive monitoring for toxicity.    Final diagnoses:  Hypoglycemia  Nausea    ED Discharge Orders     None          Gennaro Duwaine CROME, DO 10/16/23 2252

## 2023-10-16 NOTE — ED Provider Notes (Signed)
  Provider Note MRN:  [id number]  Arrival date & time: 10/17/23    ED Course and Medical Decision Making  Assumed care of patient at sign-out or upon transfer.  Type I diabetic with hyperglycemia after administering insulin , plan is for reassessment, recheck of blood sugar after he eats.  11:45 PM update: Recurrent hypoglycemia down to 25, patient started on D5 drip, given amp of D50.  Accepted for admission by hospitalist service.  .Critical Care  Performed by: Theadore Ozell HERO, MD Authorized by: Theadore Ozell HERO, MD   Critical care provider statement:    Critical care time (minutes):  34   Critical care was necessary to treat or prevent imminent or life-threatening deterioration of the following conditions: profound hypoglycemia.   Critical care was time spent personally by me on the following activities:  Development of treatment plan with patient or surrogate, discussions with consultants, evaluation of patient's response to treatment, examination of patient, ordering and review of laboratory studies, ordering and review of radiographic studies, ordering and performing treatments and interventions, pulse oximetry, re-evaluation of patient's condition and review of old charts   I assumed direction of critical care for this patient from another provider in my specialty: yes     Care discussed with: admitting provider     Final Clinical Impressions(s) / ED Diagnoses     ICD-10-CM   1. Hypoglycemia  E16.2     2. Nausea  R11.0       ED Discharge Orders     None       Discharge Instructions   None     Ozell HERO. Theadore, MD [facility] Emergency Medicine [facility] [email or web address]    Theadore Ozell HERO, MD 10/17/23 [phone]

## 2023-10-17 DIAGNOSIS — I10 Essential (primary) hypertension: Secondary | ICD-10-CM | POA: Diagnosis not present

## 2023-10-17 DIAGNOSIS — E162 Hypoglycemia, unspecified: Secondary | ICD-10-CM | POA: Diagnosis not present

## 2023-10-17 DIAGNOSIS — Z21 Asymptomatic human immunodeficiency virus [HIV] infection status: Secondary | ICD-10-CM | POA: Diagnosis not present

## 2023-10-17 DIAGNOSIS — E1021 Type 1 diabetes mellitus with diabetic nephropathy: Secondary | ICD-10-CM | POA: Diagnosis not present

## 2023-10-17 LAB — CBG MONITORING, ED
Glucose-Capillary: 139 mg/dL — ABNORMAL HIGH (ref 70–99)
Glucose-Capillary: 154 mg/dL — ABNORMAL HIGH (ref 70–99)
Glucose-Capillary: 162 mg/dL — ABNORMAL HIGH (ref 70–99)
Glucose-Capillary: 162 mg/dL — ABNORMAL HIGH (ref 70–99)
Glucose-Capillary: 175 mg/dL — ABNORMAL HIGH (ref 70–99)
Glucose-Capillary: 185 mg/dL — ABNORMAL HIGH (ref 70–99)
Glucose-Capillary: 187 mg/dL — ABNORMAL HIGH (ref 70–99)
Glucose-Capillary: 188 mg/dL — ABNORMAL HIGH (ref 70–99)

## 2023-10-17 LAB — PHOSPHORUS
Phosphorus: 4.5 mg/dL (ref 2.5–4.6)
Phosphorus: 3.3 mg/dL (ref 2.5–4.6)

## 2023-10-17 LAB — RAPID URINE DRUG SCREEN, HOSP PERFORMED
Amphetamines: NOT DETECTED
Barbiturates: NOT DETECTED
Benzodiazepines: NOT DETECTED
Cocaine: NOT DETECTED
Opiates: NOT DETECTED
Tetrahydrocannabinol: POSITIVE — AB

## 2023-10-17 LAB — CBC
HCT: 47.3 % (ref 39.0–52.0)
Hemoglobin: 16.2 g/dL (ref 13.0–17.0)
MCH: 30.7 pg (ref 26.0–34.0)
MCHC: 34.2 g/dL (ref 30.0–36.0)
MCV: 89.6 fL (ref 80.0–100.0)
Platelets: 342 K/uL (ref 150–400)
RBC: 5.28 MIL/uL (ref 4.22–5.81)
RDW: 14 % (ref 11.5–15.5)
WBC: 9.3 K/uL (ref 4.0–10.5)
nRBC: 0 % (ref 0.0–0.2)

## 2023-10-17 LAB — URINALYSIS, COMPLETE (UACMP) WITH MICROSCOPIC
Bilirubin Urine: NEGATIVE
Glucose, UA: >=500 mg/dL — AB
Ketones, ur: NEGATIVE mg/dL
Leukocytes,Ua: NEGATIVE
Nitrite: NEGATIVE
Protein, ur: >=300 mg/dL — AB
Specific Gravity, Urine: 1.015 (ref 1.005–1.030)
pH: 5 (ref 5.0–8.0)

## 2023-10-17 LAB — ETHANOL: Alcohol, Ethyl (B): <15 mg/dL (ref <15)

## 2023-10-17 LAB — COMPREHENSIVE METABOLIC PANEL WITH GFR
ALT: 18 U/L (ref 0–44)
AST: 16 U/L (ref 15–41)
Albumin: 3.4 g/dL — ABNORMAL LOW (ref 3.5–5.0)
Alkaline Phosphatase: 58 U/L (ref 38–126)
Anion gap: 8 (ref 5–15)
BUN: 16 mg/dL (ref 6–20)
CO2: 24 mmol/L (ref 22–32)
Calcium: 9.4 mg/dL (ref 8.9–10.3)
Chloride: 103 mmol/L (ref 98–111)
Creatinine, Ser: 1.75 mg/dL — ABNORMAL HIGH (ref 0.61–1.24)
GFR, Estimated: 51 mL/min — ABNORMAL LOW (ref >60)
Glucose, Bld: 185 mg/dL — ABNORMAL HIGH (ref 70–99)
Potassium: 4.6 mmol/L (ref 3.5–5.1)
Sodium: 135 mmol/L (ref 135–145)
Total Bilirubin: 0.6 mg/dL (ref 0.0–1.2)
Total Protein: 6.6 g/dL (ref 6.5–8.1)

## 2023-10-17 LAB — HEPATIC FUNCTION PANEL
ALT: 22 U/L (ref 0–44)
AST: 21 U/L (ref 15–41)
Albumin: 3.7 g/dL (ref 3.5–5.0)
Alkaline Phosphatase: 59 U/L (ref 38–126)
Bilirubin, Direct: 0.2 mg/dL (ref 0.0–0.2)
Indirect Bilirubin: 0.4 mg/dL (ref 0.3–0.9)
Total Bilirubin: 0.6 mg/dL (ref 0.0–1.2)
Total Protein: 7.1 g/dL (ref 6.5–8.1)

## 2023-10-17 LAB — CK: Total CK: 161 U/L (ref 49–397)

## 2023-10-17 LAB — MAGNESIUM
Magnesium: 2 mg/dL (ref 1.7–2.4)
Magnesium: 1.5 mg/dL — ABNORMAL LOW (ref 1.7–2.4)

## 2023-10-17 LAB — T-HELPER CELLS (CD4) COUNT (NOT AT ARMC)
CD4 % Helper T Cell: 37 % (ref 33–65)
CD4 T Cell Abs: 665 /uL (ref 400–1790)

## 2023-10-17 LAB — GLUCOSE, CAPILLARY: Glucose-Capillary: 190 mg/dL — ABNORMAL HIGH (ref 70–99)

## 2023-10-17 LAB — PREALBUMIN: Prealbumin: 21 mg/dL (ref 18–38)

## 2023-10-17 LAB — T4, FREE: Free T4: 0.75 ng/dL (ref 0.61–1.12)

## 2023-10-17 LAB — TSH: TSH: 3.272 u[IU]/mL (ref 0.350–4.500)

## 2023-10-17 MED ORDER — ONDANSETRON HCL 4 MG PO TABS
4.0000 mg | ORAL_TABLET | Freq: Four times a day (QID) | ORAL | Status: DC | PRN
  Administered 2023-10-17: 4 mg via ORAL
  Filled 2023-10-17: qty 1

## 2023-10-17 MED ORDER — HYDROCODONE-ACETAMINOPHEN 5-325 MG PO TABS
1.0000 | ORAL_TABLET | ORAL | Status: DC | PRN
  Administered 2023-10-17: 2 via ORAL
  Administered 2023-10-18: 1 via ORAL
  Filled 2023-10-17: qty 1
  Filled 2023-10-17: qty 2

## 2023-10-17 MED ORDER — ACETAMINOPHEN 325 MG PO TABS
650.0000 mg | ORAL_TABLET | Freq: Four times a day (QID) | ORAL | Status: DC | PRN
  Administered 2023-10-17: 650 mg via ORAL
  Filled 2023-10-17: qty 2

## 2023-10-17 MED ORDER — INSULIN ASPART 100 UNIT/ML IJ SOLN
0.0000 [IU] | INTRAMUSCULAR | Status: DC
  Administered 2023-10-17: 2 [IU] via SUBCUTANEOUS
  Administered 2023-10-18: 5 [IU] via SUBCUTANEOUS
  Administered 2023-10-18: 4 [IU] via SUBCUTANEOUS
  Administered 2023-10-18 (×2): 1 [IU] via SUBCUTANEOUS

## 2023-10-17 MED ORDER — SERTRALINE HCL 50 MG PO TABS
150.0000 mg | ORAL_TABLET | Freq: Every day | ORAL | Status: DC
  Administered 2023-10-17 – 2023-10-19 (×3): 150 mg via ORAL
  Filled 2023-10-17 (×3): qty 1

## 2023-10-17 MED ORDER — MAGNESIUM OXIDE -MG SUPPLEMENT 400 (240 MG) MG PO TABS
400.0000 mg | ORAL_TABLET | Freq: Every day | ORAL | Status: AC
  Administered 2023-10-17 – 2023-10-19 (×3): 400 mg via ORAL
  Filled 2023-10-17 (×3): qty 1

## 2023-10-17 MED ORDER — ELVITEG-COBIC-EMTRICIT-TENOFAF 150-150-200-10 MG PO TABS
1.0000 | ORAL_TABLET | Freq: Every day | ORAL | Status: DC
  Administered 2023-10-18 – 2023-10-19 (×2): 1 via ORAL
  Filled 2023-10-17 (×2): qty 1

## 2023-10-17 MED ORDER — ACETAMINOPHEN 650 MG RE SUPP: 650.0000 mg | Freq: Four times a day (QID) | RECTAL | Status: DC | PRN

## 2023-10-17 MED ORDER — ONDANSETRON HCL 4 MG/2ML IJ SOLN
4.0000 mg | Freq: Four times a day (QID) | INTRAMUSCULAR | Status: DC | PRN
Start: 2023-10-17 — End: 2023-10-19

## 2023-10-17 MED ORDER — IRBESARTAN 75 MG PO TABS
75.0000 mg | ORAL_TABLET | Freq: Every day | ORAL | Status: DC
  Filled 2023-10-17: qty 1

## 2023-10-17 MED ORDER — AMLODIPINE BESYLATE 5 MG PO TABS
5.0000 mg | ORAL_TABLET | Freq: Every day | ORAL | Status: DC
  Administered 2023-10-17: 5 mg via ORAL
  Filled 2023-10-17: qty 1

## 2023-10-17 MED ORDER — DEXTROSE-SODIUM CHLORIDE 5-0.45 % IV SOLN: INTRAVENOUS | Status: AC

## 2023-10-17 MED ORDER — SIMETHICONE 80 MG PO CHEW: 80.0000 mg | CHEWABLE_TABLET | Freq: Four times a day (QID) | ORAL | Status: DC | PRN

## 2023-10-17 NOTE — Assessment & Plan Note (Addendum)
 Appreciate diabetes coordinator consult Monitor blood sugar carefully for tonight If remains stable would restart sliding scale if remains hypoglycemic hold off and continue D5 half-normal saline Patient will greatly benefit from endocrinology follow-up as an outpatient and perhaps insulin  pump

## 2023-10-17 NOTE — Assessment & Plan Note (Signed)
-   Continue home medications

## 2023-10-17 NOTE — Inpatient Diabetes Management (Signed)
 Inpatient Diabetes Program Recommendations  AACE/ADA: New Consensus Statement on Inpatient Glycemic Control (2015)  Target Ranges:  Prepandial:   less than 140 mg/dL      Peak postprandial:   less than 180 mg/dL (1-2 hours)      Critically ill patients:  140 - 180 mg/dL   Lab Results  Component Value Date   GLUCAP 185 (H) 10/17/2023   HGBA1C 9.4 (H) 06/01/2023    Review of Glycemic Control  Latest Reference Range & Units 10/17/23 07:39 10/17/23 10:02 10/17/23 13:43  Glucose-Capillary 70 - 99 mg/dL 837 (H) 837 (H) 814 (H)   Diabetes history: DM 1 Outpatient Diabetes medications:  FSL3 Lantus  22 units daily Humalog  0-12 units daily Current orders for Inpatient glycemic control:  None  Inpatient Diabetes Program Recommendations:    Please add Novolog  0-6 units q 4 hours.   Note hypoglycemia. Unsure of cause.  Per MD note, patient took basal insulin  yesterday. Will need basal insulin  resumed as well once CBG's are stable.  Of note, patient does have CGM- FSL3.  Unsure of cause of hypoglycemia.  DM coordinator will follow-up on 10/18/23.   Thanks,  Randall Bullocks, RN, BC-ADM Inpatient Diabetes Coordinator Pager [phone]  (8a-5p)

## 2023-10-17 NOTE — Assessment & Plan Note (Signed)
 Continue Norvasc  5 mg a day given worsening renal function hold off on Diovan 

## 2023-10-17 NOTE — Assessment & Plan Note (Addendum)
 Difficult to manage brittle diabetic Order CBG every 2 hours D5 half-normal while intermittently hypoglycemic until stabilized Blood sugar still continues to be rapidly down trending Patient took Lantus  22 units in the morning With poor p.o. intake throughout the day Once blood sugar stabilizes will need to restart insulin  as patient is type I diabetic to avoid DKA may need to choose a lower dose and/or start with sliding scale

## 2023-10-17 NOTE — Assessment & Plan Note (Signed)
 Check CD4 count and HIV viral load

## 2023-10-17 NOTE — Plan of Care (Signed)

## 2023-10-17 NOTE — Assessment & Plan Note (Signed)
-  chronic avoid nephrotoxic medications such as NSAIDs, Vanco Zosyn combo,  avoid hypotension, continue to follow renal function

## 2023-10-17 NOTE — Progress Notes (Signed)
 Progress Note   Patient: [name] FMW:[id number] DOB: 1986-01-06 DOA: 10/16/2023     0 DOS: the patient was seen and examined on 10/17/2023   Brief hospital course: [name] is a 38 y.o. male with medical history significant of DM1, HTN, HIV, CKD, asthma presented with confusion diaphoresis and found to have hypoglycemia. He took Lantus  and not eating well, CBG noted less than 30. Patient is started on D5 admitted to hospitalist service for further management  Assessment and Plan: Hypoglycemia- Uncontrolled Type 1 diabetes. Difficult to manage brittle diabetic.  Continue CBG checks. Hypoglycemia protocol. Sugars stable. D5 half-norma stopped. He is nauseous, willing to try clears. He took Lantus  yesterday. Hold home insulin  regimen. Plan to resume insulin  once he is able to tolerate diet, sugars remain stable. Diabetes coordinator consulted.  Essential hypertension Continue Norvasc  5 mg. Resume diovan  if renal function stable.   CKD (chronic kidney disease) stage 3- Stable. Baseline creatinine around 1.2-1.9 Continue to monitor renal function. Avoid nephrotoxic drugs.    HIV (human immunodeficiency virus infection) (HCC) Recent CD4 count > 665 and HIV viral load undetectable. Continue home HAART regimen.   Asthma No exacerbation. Continue bronchodilators as needed.  Marijuana abuse- Possible nausea in the setting of THC. Will get urine tox screen.     Out of bed to chair. Incentive spirometry. Nursing supportive care. Fall, aspiration precautions. Diet:  Diet Orders (From admission, onward)     Start     Ordered   10/17/23 1123  Diet clear liquid Room service appropriate? Yes; Fluid consistency: Thin  Diet effective now       Question Answer Comment  Room service appropriate? Yes   Fluid consistency: Thin      10/17/23 1122           DVT prophylaxis:   Level of care: Progressive   Code Status: Full Code  Subjective: Patient  is seen and examined today morning. Did not eat breakfast, is nauseous but no vomiting. No abdominal pain. Advised clears.   Physical Exam: Vitals:   10/17/23 0640 10/17/23 1000 10/17/23 1047 10/17/23 1100  BP: (!) 164/109 (!) 140/82 (!) 155/112 (!) 150/103  Pulse: 70  74   Resp: 17 11 16 16   Temp: 97.7 F (36.5 C)     TempSrc: Oral     SpO2: 100% 100% 100%   Weight:      Height:        General - Young African American male, distress due to nausea HEENT - PERRLA, EOMI, atraumatic head, non tender sinuses. Lung - Clear, basal rales, no rhonchi, wheezes. Heart - S1, S2 heard, no murmurs, rubs, no pedal edema. Abdomen - Soft, non tender, bowel sounds good Neuro - sleeping, arousable, non focal exam. Skin - Warm and dry.  Data Reviewed:      Latest Ref Rng & Units 10/16/2023   10:09 PM 09/04/2023    8:41 AM 06/03/2023    6:03 AM  CBC  WBC 4.0 - 10.5 K/uL 9.5  7.3  7.4   Hemoglobin 13.0 - 17.0 g/dL 83.8  84.5  86.5   Hematocrit 39.0 - 52.0 % 48.3  46.3  38.6   Platelets 150 - 400 K/uL 348  297  231       Latest Ref Rng & Units 10/16/2023   10:09 PM 09/04/2023    8:41 AM 06/03/2023    6:03 AM  BMP  Glucose 70 - 99 mg/dL 46  865  890  BUN 6 - 20 mg/dL 17  20  24    Creatinine 0.61 - 1.24 mg/dL 8.40  8.28  8.05   BUN/Creat Ratio 6 - 22 (calc)  12    Sodium 135 - 145 mmol/L 141  140  138   Potassium 3.5 - 5.1 mmol/L 4.5  4.5  4.1   Chloride 98 - 111 mmol/L 104  105  106   CO2 22 - 32 mmol/L 26  26  24    Calcium  8.9 - 10.3 mg/dL 9.7  9.6  9.1    No results found.  Family Communication: Discussed with patient, understand and agree. All questions answered.  Disposition: Status is: Observation The patient remains OBS appropriate and will d/c before 2 midnights.  Planned Discharge Destination: Home     Time spent: 41 minutes  Author: Concepcion Riser, MD 10/17/2023 2:52 PM Secure chat 7am to 7pm For on call review [email or web address].

## 2023-10-17 NOTE — TOC Initial Note (Addendum)
 Transition of Care [facility]) - Initial/Assessment Note    Patient Details  Name: [name] MRN: [id number] Date of Birth: May 05, 1985  [facility] CM/SW Contact:    Marval Gell, RN Phone Number: 10/17/2023, 3:35 PM  Clinical Narrative:                  Per chart review From [facility] with hypoglycemic event.  PCP- [facility] Insurance coverage through [place] Will continue to follow for needs    Barriers to Discharge: Continued Medical Work up   Patient Goals and CMS Choice            Expected Discharge Plan and Services In-house Referral: Clinical Social Work     Living arrangements for the past 2 months: Homeless Shelter                                      Prior Living Arrangements/Services Living arrangements for the past 2 months: Homeless Shelter                     Activities of Daily Living      Permission Sought/Granted                  Emotional Assessment              Admission diagnosis:  Nausea [R11.0] Hypoglycemia [E16.2] Patient Active Problem List   Diagnosis Date Noted   DKA (diabetic ketoacidosis) (HCC) 06/01/2023   Hypoglycemia 10/01/2022   HIV (human immunodeficiency virus infection) (HCC) 10/01/2022   CKD (chronic kidney disease) stage 2, GFR 60-89 ml/min 10/01/2022   Homelessness 08/16/2022   Hypocalcemia 03/21/2022   Routine screening for STI (sexually transmitted infection) 02/03/2022   Essential hypertension 09/28/2020   History of suicidal ideation 02/09/2020   MDD (major depressive disorder), recurrent episode, severe (HCC) 01/15/2020   Syphilis 01/15/2019   HIV infection (HCC) 11/11/2014   Asthma 10/02/2010   Type 1 diabetes (HCC) 03/01/1991   PCP:  Delbert Clam, MD Pharmacy:   [facility] [address] [place] [address] Phone: [phone] Fax: [phone]  [facility] [address] [place] [address] Phone: [phone] Fax: [phone]  [id number] [place], [place] - [address] AT [facility] OF [address] [place] [address] Phone: [phone] Fax: [phone]     Social Drivers of Health (SDOH) Social History: SDOH Screenings   Food Insecurity: Unknown (08/29/2023)  Housing: High Risk (08/29/2023)  Transportation Needs: Unmet Transportation Needs (06/05/2023)  Utilities: At Risk (06/05/2023)  Depression (PHQ2-9): Medium Risk (01/06/2023)  Financial Resource Strain: High Risk (08/29/2023)  Physical Activity: Inactive (01/06/2023)  Social Connections: Socially Isolated (01/06/2023)  Stress: Stress Concern Present (01/06/2023)  Tobacco Use: Medium Risk (10/16/2023)  Health Literacy: Adequate Health Literacy (01/06/2023)   SDOH Interventions:     Readmission Risk Interventions     No data to display

## 2023-10-18 DIAGNOSIS — E1021 Type 1 diabetes mellitus with diabetic nephropathy: Secondary | ICD-10-CM | POA: Diagnosis not present

## 2023-10-18 DIAGNOSIS — I10 Essential (primary) hypertension: Secondary | ICD-10-CM | POA: Diagnosis not present

## 2023-10-18 DIAGNOSIS — E162 Hypoglycemia, unspecified: Secondary | ICD-10-CM | POA: Diagnosis not present

## 2023-10-18 DIAGNOSIS — N182 Chronic kidney disease, stage 2 (mild): Secondary | ICD-10-CM | POA: Diagnosis not present

## 2023-10-18 LAB — GLUCOSE, CAPILLARY
Glucose-Capillary: 170 mg/dL — ABNORMAL HIGH (ref 70–99)
Glucose-Capillary: 96 mg/dL (ref 70–99)
Glucose-Capillary: 327 mg/dL — ABNORMAL HIGH (ref 70–99)
Glucose-Capillary: 357 mg/dL — ABNORMAL HIGH (ref 70–99)
Glucose-Capillary: 156 mg/dL — ABNORMAL HIGH (ref 70–99)
Glucose-Capillary: 108 mg/dL — ABNORMAL HIGH (ref 70–99)

## 2023-10-18 LAB — HIV-1 RNA QUANT-NO REFLEX-BLD
HIV 1 RNA Quant: <20 {copies}/mL
LOG10 HIV-1 RNA: UNDETERMINED {Log_copies}/mL

## 2023-10-18 LAB — MAGNESIUM: Magnesium: 1.9 mg/dL (ref 1.7–2.4)

## 2023-10-18 LAB — BASIC METABOLIC PANEL WITH GFR
Anion gap: 12 (ref 5–15)
BUN: 19 mg/dL (ref 6–20)
CO2: 24 mmol/L (ref 22–32)
Calcium: 9.6 mg/dL (ref 8.9–10.3)
Chloride: 101 mmol/L (ref 98–111)
Creatinine, Ser: 1.78 mg/dL — ABNORMAL HIGH (ref 0.61–1.24)
GFR, Estimated: 50 mL/min — ABNORMAL LOW (ref >60)
Glucose, Bld: 128 mg/dL — ABNORMAL HIGH (ref 70–99)
Potassium: 4.6 mmol/L (ref 3.5–5.1)
Sodium: 137 mmol/L (ref 135–145)

## 2023-10-18 LAB — T3: T3, Total: 119 ng/dL (ref 71–180)

## 2023-10-18 MED ORDER — ENOXAPARIN SODIUM 40 MG/0.4ML IJ SOSY
40.0000 mg | PREFILLED_SYRINGE | INTRAMUSCULAR | Status: DC
  Administered 2023-10-18 – 2023-10-19 (×2): 40 mg via SUBCUTANEOUS
  Filled 2023-10-18 (×2): qty 0.4

## 2023-10-18 MED ORDER — RENA-VITE PO TABS
1.0000 | ORAL_TABLET | Freq: Every day | ORAL | Status: DC
  Administered 2023-10-18: 1 via ORAL
  Filled 2023-10-18: qty 1

## 2023-10-18 MED ORDER — ADULT MULTIVITAMIN W/MINERALS CH: 1.0000 | ORAL_TABLET | Freq: Every day | ORAL | Status: DC

## 2023-10-18 MED ORDER — INSULIN GLARGINE-YFGN 100 UNIT/ML ~~LOC~~ SOLN: 11.0000 [IU] | Freq: Every day | SUBCUTANEOUS | Status: DC

## 2023-10-18 MED ORDER — INSULIN GLARGINE 100 UNIT/ML ~~LOC~~ SOLN
11.0000 [IU] | Freq: Every day | SUBCUTANEOUS | Status: DC
  Administered 2023-10-18: 11 [IU] via SUBCUTANEOUS
  Filled 2023-10-18 (×2): qty 0.11

## 2023-10-18 MED ORDER — AMLODIPINE BESYLATE 10 MG PO TABS
10.0000 mg | ORAL_TABLET | Freq: Every day | ORAL | Status: DC
  Administered 2023-10-18 – 2023-10-19 (×2): 10 mg via ORAL
  Filled 2023-10-18 (×2): qty 1

## 2023-10-18 MED ORDER — ALBUTEROL SULFATE (2.5 MG/3ML) 0.083% IN NEBU: 2.5000 mg | INHALATION_SOLUTION | RESPIRATORY_TRACT | Status: DC | PRN

## 2023-10-18 NOTE — Inpatient Diabetes Management (Addendum)
 Inpatient Diabetes Program Recommendations  AACE/ADA: New Consensus Statement on Inpatient Glycemic Control (2015)  Target Ranges:  Prepandial:   less than 140 mg/dL      Peak postprandial:   less than 180 mg/dL (1-2 hours)      Critically ill patients:  140 - 180 mg/dL   Lab Results  Component Value Date   GLUCAP 96 10/18/2023   HGBA1C 9.4 (H) 06/01/2023    Review of Glycemic Control  Latest Reference Range & Units 10/17/23 07:39 10/17/23 10:02 10/17/23 13:43 10/17/23 15:39 10/18/23 01:37 10/18/23 07:51  Glucose-Capillary 70 - 99 mg/dL 837 (H) 837 (H) 814 (H) 190 (H) 170 (H) 96  (H): Data is abnormally high Diabetes history: DM 1 Outpatient Diabetes medications:  FSL3 Lantus  22 units daily Humalog  0-12 units daily Current orders for Inpatient glycemic control:  Semglee  11 units every day Novolog  0-6 units Q4H   Noted addition of basal. Concern for continued lows with dose. Per chart review, patient was prescribed Lantus  14 units every day through [facility].  Given current insulin  needs, recommend decreasing Semglee  to 8 untis every day.  Also, patient last A1C in April, consider repeat?   Spoke with patient regarding outpatient diabetes management. Patient reports being in a hurry with work and not eating at the right time to support insulin  dose. He feels this happens a lot. Verified home medications.  Reviewed patient's current A1c of 9.%. Explained what a A1c is and what it measures. Also reviewed goal A1c with patient, importance of good glucose control @ home, and blood sugar goals. Reviewed patho of DM, need for improved control, importance of being safe with dosing, when to take, importance of close follow up with [facility], risk of hypoglycemia, vascular changes, renal impact with dosing and commorbidities.  Patient has a meter and testing supplies. Also, provided with Dexcom G7 sensors. Has used in the past. Interested in continuing to use. At discharge please order Dexcom.   Anticipate doses to be reduced. Feels home basal dose is too high.    Thanks, Tinnie Minus, MSN, RNC-OB Diabetes Coordinator [phone] (8a-5p)

## 2023-10-18 NOTE — Progress Notes (Signed)
 PROGRESS NOTE        PATIENT DETAILS Name: [name] Age: 38 y.o. Sex: male Date of Birth: 09/10/1985 Admit Date: 10/16/2023 Admitting Physician Blease Quiver, MD ERE:Wztopw, Corrina, MD  Brief Summary: Patient is a 38 y.o.  male with history of DM-1, HIV, CKD stage IIIb, asthma-who presented with a hypoglycemic episode.  Significant events: 8/18>> admit to [facility]-hypoglycemia.  Significant studies: None  Significant microbiology data: None  Procedures: None  Consults: None  Subjective: Lying comfortably in bed-denies any chest pain or shortness of breath.  No further hypoglycemic episodes-appetite has improved.  Objective: Vitals: Blood pressure (!) 148/121, pulse 80, temperature 97.9 F (36.6 C), temperature source Oral, resp. rate 12, height 6' (1.829 m), weight 86.2 kg, SpO2 97%.   Exam: Gen Exam:Alert awake-not in any distress HEENT:atraumatic, normocephalic Chest: B/L clear to auscultation anteriorly CVS:S1S2 regular Abdomen:soft non tender, non distended Extremities:no edema Neurology: Non focal Skin: no rash  Pertinent Labs/Radiology:    Latest Ref Rng & Units 10/17/2023    4:17 PM 10/16/2023   10:09 PM 09/04/2023    8:41 AM  CBC  WBC 4.0 - 10.5 K/uL 9.3  9.5  7.3   Hemoglobin 13.0 - 17.0 g/dL 83.7  83.8  84.5   Hematocrit 39.0 - 52.0 % 47.3  48.3  46.3   Platelets 150 - 400 K/uL 342  348  297     Lab Results  Component Value Date   NA 135 10/17/2023   K 4.6 10/17/2023   CL 103 10/17/2023   CO2 24 10/17/2023      Assessment/Plan: Hypoglycemia Secondary to poor oral intake (on day of admission-had worked the whole day-had not eaten much-and subsequently used short acting insulin  before he could eat a piece of pizza-started having hypoglycemic symptoms immediately) Hypoglycemia has resolved with supportive care Starting long-acting insulin -see below.  DM-1 (A1c 9.4 on 4/3) Appears to have brittle  diabetes-acknowledges having both hypo/hyperglycemic episodes often in the outpatient setting Admitted with severe hypoglycemia-see above Plan is to resume long-acting insulin  today-will start Semglee  11 units (half of his home dose Continue SSI He is homeless-[place]-will need inpatient monitoring to ensure CBG stable before we can discharge him safely.  CKD stage IIIb Close to baseline  HTN BP on the high side Increase amlodipine  to 10 mg Continue to hold valsartan  for now.  HIV (CD4 665 on 8/19)  Well-controlled Continue ART  Bronchial asthma Stable-not in exacerbation Prn bronchodilators  Homelessness [place] [facility]-prior to that-was living out of his car.  Code status:   Code Status: Full Code   DVT Prophylaxis: SCDs Start: 10/17/23 1538   Family Communication: None at bedside   Disposition Plan: Status is: Observation The patient will require care spanning > 2 midnights and should be moved to inpatient because: Severity of illness   Planned Discharge Destination:Home   Diet: Diet Order             DIET SOFT Room service appropriate? Yes; Fluid consistency: Thin  Diet effective now                     Antimicrobial agents: Anti-infectives (From admission, onward)    Start     Dose/Rate Route Frequency Ordered Stop   10/18/23 0800  elvitegravir-cobicistat-emtricitabine-tenofovir (GENVOYA ) 150-150-200-10 MG tablet 1 tablet  1 tablet Oral Daily with breakfast 10/17/23 1537          MEDICATIONS: Scheduled Meds:  amLODipine   5 mg Oral Daily   elvitegravir-cobicistat-emtricitabine-tenofovir  1 tablet Oral Q breakfast   insulin  aspart  0-6 Units Subcutaneous Q4H   insulin  glargine  11 Units Subcutaneous Daily   magnesium  oxide  400 mg Oral Daily   sertraline   150 mg Oral Daily   Continuous Infusions: PRN Meds:.acetaminophen  **OR** acetaminophen , HYDROcodone -acetaminophen , ondansetron  **OR** ondansetron   (ZOFRAN ) IV, simethicone    I have personally reviewed following labs and imaging studies  LABORATORY DATA: CBC: Recent Labs  Lab 10/16/23 2209 10/17/23 1617  WBC 9.5 9.3  NEUTROABS 7.4  --   HGB 16.1 16.2  HCT 48.3 47.3  MCV 92.5 89.6  PLT 348 342    Basic Metabolic Panel: Recent Labs  Lab 10/16/23 2209 10/16/23 2345 10/17/23 1617  NA 141  --  135  K 4.5  --  4.6  CL 104  --  103  CO2 26  --  24  GLUCOSE 46*  --  185*  BUN 17  --  16  CREATININE 1.59*  --  1.75*  CALCIUM  9.7  --  9.4  MG  --  2.0 1.5*  PHOS  --  4.5 3.3    GFR: Estimated Creatinine Clearance: 63.4 mL/min (A) (by C-G formula based on SCr of 1.75 mg/dL (H)).  Liver Function Tests: Recent Labs  Lab 10/16/23 2345 10/17/23 1617  AST 21 16  ALT 22 18  ALKPHOS 59 58  BILITOT 0.6 0.6  PROT 7.1 6.6  ALBUMIN 3.7 3.4*   No results for input(s): LIPASE, AMYLASE in the last 168 hours. No results for input(s): AMMONIA in the last 168 hours.  Coagulation Profile: No results for input(s): INR, PROTIME in the last 168 hours.  Cardiac Enzymes: Recent Labs  Lab 10/16/23 2345  CKTOTAL 161    BNP (last 3 results) No results for input(s): PROBNP in the last 8760 hours.  Lipid Profile: No results for input(s): CHOL, HDL, LDLCALC, TRIG, CHOLHDL, LDLDIRECT in the last 72 hours.  Thyroid  Function Tests: Recent Labs    10/16/23 2209 10/16/23 2345  TSH 3.272  --   FREET4  --  0.75    Anemia Panel: No results for input(s): VITAMINB12, FOLATE, FERRITIN, TIBC, IRON, RETICCTPCT in the last 72 hours.  Urine analysis:    Component Value Date/Time   COLORURINE YELLOW 10/17/2023 2324   APPEARANCEUR CLEAR 10/17/2023 2324   LABSPEC 1.015 10/17/2023 2324   PHURINE 5.0 10/17/2023 2324   GLUCOSEU >=500 (A) 10/17/2023 2324   HGBUR MODERATE (A) 10/17/2023 2324   BILIRUBINUR NEGATIVE 10/17/2023 2324   KETONESUR NEGATIVE 10/17/2023 2324   PROTEINUR >=300 (A)  10/17/2023 2324   UROBILINOGEN 0.2 02/10/2015 1416   NITRITE NEGATIVE 10/17/2023 2324   LEUKOCYTESUR NEGATIVE 10/17/2023 2324    Sepsis Labs: Lactic Acid, Venous    Component Value Date/Time   LATICACIDVEN 1.3 10/01/2022 0858    MICROBIOLOGY: No results found for this or any previous visit (from the past 240 hours).  RADIOLOGY STUDIES/RESULTS: No results found.   LOS: 0 days   Donalda Applebaum, MD  [facility]    To contact the attending provider between 7A-7P or the covering provider during after hours 7P-7A, please log into the web site [email or web address] and access using universal Webb password for that web site. If you do not have the password, please call the hospital operator.  10/18/2023, 8:51  AM

## 2023-10-18 NOTE — Plan of Care (Signed)

## 2023-10-18 NOTE — Progress Notes (Signed)
 Initial Nutrition Assessment  DOCUMENTATION CODES:   Not applicable  INTERVENTION:   - Renal MVI daily in the setting of CKD stage IIIb  - Magic Cup BID with lunch and dinner meals, each supplement provides 290 kcal and 9 grams of protein  - Encourage PO intake  NUTRITION DIAGNOSIS:   Increased nutrient needs related to acute illness as evidenced by estimated needs.  GOAL:   Patient will meet greater than or equal to 90% of their needs  MONITOR:   PO intake, Supplement acceptance, Labs, Weight trends  REASON FOR ASSESSMENT:   Consult Assessment of nutrition requirement/status  ASSESSMENT:   38 year old male who presented to the ED on 10/16/23 with hypoglycemia. PMH of T1DM, HTN, HIV, CKD IIIb, asthma, homelessness.  Attempted to speak with pt at bedside. However, pt unavailable at time of RD visit. Visitor in room but unsure of relation to pt.  Pt currently on a GI soft diet with 75-100% meal completions charted. Reviewed weight history in chart. Noted pt with a weight loss of 2.3 kg or 2.6% from 08/29/23 to 10/16/23. This is not clinically significant for timeframe.  RD will order renal MVI given pt with CKD stage IIIb. Will also order Magic Cups with lunch and dinner meals to provide additional kcal and protein. Diabetes Coordinator is following.  Meal Completion: 75-100%  Medications reviewed and include: genvoya , SSI every 4 hours, Lantus  11 units daily, magnesium  oxide 400 mg daily  Labs reviewed: creatinine 1.78, hemoglobin A1c 9.4% on 06/01/23 CBG's: 96-327 x 24 hours  NUTRITION - FOCUSED PHYSICAL EXAM:  Unable to complete. Pt unavailable.  Diet Order:   Diet Order             DIET SOFT Room service appropriate? Yes; Fluid consistency: Thin  Diet effective now                   EDUCATION NEEDS:   Not appropriate for education at this time  Skin:  Skin Assessment: Reviewed RN Assessment  Last BM:  10/16/23  Height:   Ht Readings from Last 1  Encounters:  10/16/23 6' (1.829 m)    Weight:   Wt Readings from Last 1 Encounters:  10/16/23 86.2 kg    BMI:  Body mass index is 25.77 kg/m.  Estimated Nutritional Needs:   Kcal:  7649-7449  Protein:  105-120 grams  Fluid:  >2.2 L    Mallie Satchel, MS, RD, LDN Registered Dietitian II Please see AMiON for contact information.

## 2023-10-19 ENCOUNTER — Other Ambulatory Visit (HOSPITAL_COMMUNITY): Payer: Self-pay

## 2023-10-19 DIAGNOSIS — I10 Essential (primary) hypertension: Secondary | ICD-10-CM | POA: Diagnosis not present

## 2023-10-19 DIAGNOSIS — E162 Hypoglycemia, unspecified: Secondary | ICD-10-CM | POA: Diagnosis not present

## 2023-10-19 DIAGNOSIS — N182 Chronic kidney disease, stage 2 (mild): Secondary | ICD-10-CM | POA: Diagnosis not present

## 2023-10-19 DIAGNOSIS — E1021 Type 1 diabetes mellitus with diabetic nephropathy: Secondary | ICD-10-CM | POA: Diagnosis not present

## 2023-10-19 LAB — GLUCOSE, CAPILLARY
Glucose-Capillary: 134 mg/dL — ABNORMAL HIGH (ref 70–99)
Glucose-Capillary: 233 mg/dL — ABNORMAL HIGH (ref 70–99)

## 2023-10-19 MED ORDER — INSULIN ASPART 100 UNIT/ML IJ SOLN
0.0000 [IU] | Freq: Three times a day (TID) | INTRAMUSCULAR | Status: DC
  Administered 2023-10-19: 3 [IU] via SUBCUTANEOUS

## 2023-10-19 MED ORDER — INSULIN GLARGINE 100 UNIT/ML SOLOSTAR PEN
14.0000 [IU] | PEN_INJECTOR | Freq: Every day | SUBCUTANEOUS | 2 refills | Status: DC
Start: 2023-10-20 — End: 2023-10-24
  Filled 2023-10-19: qty 15, 90d supply, fill #0

## 2023-10-19 MED ORDER — INSULIN GLARGINE 100 UNIT/ML ~~LOC~~ SOLN
14.0000 [IU] | Freq: Every day | SUBCUTANEOUS | Status: DC
  Administered 2023-10-19: 14 [IU] via SUBCUTANEOUS
  Filled 2023-10-19: qty 0.14

## 2023-10-19 NOTE — TOC Transition Note (Signed)
 Transition of Care [facility]) - Discharge Note   Patient Details  Name: [name] MRN: [id number] Date of Birth: 1986/02/02  [facility] CM/SW Contact:  Inocente GORMAN Kindle, LCSW Phone Number: 10/19/2023, 10:32 AM   Clinical Narrative:    Letter provided to patient for [employer].    Final next level of care: Home/Self Care Barriers to Discharge: Barriers Resolved   Patient Goals and CMS Choice            Discharge Placement                       Discharge Plan and Services Additional resources added to the After Visit Summary for   In-house Referral: Clinical Social Work                                   Social Drivers of Health (SDOH) Interventions SDOH Screenings   Food Insecurity: No Food Insecurity (10/18/2023)  Housing: High Risk (10/18/2023)  Transportation Needs: Unmet Transportation Needs (10/18/2023)  Utilities: At Risk (10/18/2023)  Depression (PHQ2-9): Medium Risk (01/06/2023)  Financial Resource Strain: High Risk (08/29/2023)  Physical Activity: Inactive (01/06/2023)  Social Connections: Socially Isolated (01/06/2023)  Stress: Stress Concern Present (01/06/2023)  Tobacco Use: Medium Risk (10/16/2023)  Health Literacy: Adequate Health Literacy (01/06/2023)     Readmission Risk Interventions     No data to display

## 2023-10-19 NOTE — Plan of Care (Signed)
  Problem: Education: Goal: Knowledge of General Education information will improve Description: Including pain rating scale, medication(s)/side effects and non-pharmacologic comfort measures Outcome: Progressing   Problem: Health Behavior/Discharge Planning: Goal: Ability to manage health-related needs will improve Outcome: Progressing   Problem: Clinical Measurements: Goal: Ability to maintain clinical measurements within normal limits will improve Outcome: Progressing   Problem: Coping: Goal: Level of anxiety will decrease Outcome: Progressing   Problem: Education: Goal: Ability to describe self-care measures that may prevent or decrease complications (Diabetes Survival Skills Education) will improve Outcome: Progressing   Problem: Coping: Goal: Ability to adjust to condition or change in health will improve Outcome: Progressing

## 2023-10-19 NOTE — Discharge Instructions (Signed)
 [name]

## 2023-10-19 NOTE — Discharge Summary (Signed)
 PATIENT DETAILS Name: [name] Age: 38 y.o. Sex: male Date of Birth: [birth year] MRN: [id number]. Admitting Physician: Blease Quiver, MD ERE:Wztopw, Corrina, MD  Admit Date: 10/16/2023 Discharge date: 10/19/2023  Recommendations for Outpatient Follow-up:  Follow up with PCP in 1-2 weeks Please obtain CMP/CBC in one week  Admitted From:  Home  Disposition: Home   Discharge Condition: good  CODE STATUS:   Code Status: Full Code   Diet recommendation:  Diet Order             Diet - low sodium heart healthy           Diet general           DIET SOFT Room service appropriate? Yes; Fluid consistency: Thin  Diet effective now                    Brief Summary: Patient is a 38 y.o.  male with history of DM-1, HIV, CKD stage IIIb, asthma-who presented with a hypoglycemic episode.   Significant events: 8/18>> admit to [facility]-hypoglycemia. 8/20>> started on long-acting insulin -Semglee  11 units in the hospital 8/21>> no further hypoglycemia-plan is to discharge home.   Significant studies: None   Significant microbiology data: None   Procedures: None   Consults: None  Brief Hospital Course: Hypoglycemia Secondary to poor oral intake (on day of admission-had worked the whole day-had not eaten much-and subsequently used short acting insulin  before he could eat a piece of pizza-started having hypoglycemic symptoms immediately) Hypoglycemia has resolved with supportive care He has been counseled extensively regarding importance of not missing meals-and having a stable diet plan/schedule.   DM-1 (A1c 9.4 on 4/3) Appears to have brittle diabetes-acknowledges having both hypo/hyperglycemic episodes often in the outpatient setting Admitted with severe hypoglycemia-see above Once hypoglycemia resolved-he was started on Semglee  11 units (half of his home dose of 22 units)-no further episodes of hypoglycemia-some mild hypoglycemic episodes.  Plan is to  increase Semglee  to 14 units today-he will continue to be on his usual sliding scale regimen.  He has been counseled extensively regarding not missing meals-to prevent further hypoglycemic episodes in the future.  He will keep an eye on his sugars-and follow-up with his primary care practitioner for further optimization of his diabetic regimen.     CKD stage IIIb Close to baseline   HTN On the higher side Continue amlodipine /valsartan  on discharge Follow-up with PCP PCP to continue to monitor renal function   HIV (CD4 665 on 8/19)  Well-controlled Continue ART   Bronchial asthma Stable-not in exacerbation Prn bronchodilators   [facility]-prior to that-was living out of his car.   Discharge Diagnoses:  Active Problems:   Type 1 diabetes (HCC)   Hypoglycemia   Essential hypertension   CKD (chronic kidney disease) stage 2, GFR 60-89 ml/min   HIV (human immunodeficiency virus infection) (HCC)   Asthma   Discharge Instructions:  Activity:  As tolerated    Discharge Instructions     AMB Referral [facility] Care Management   Complete by: As directed    Issues with hypoglycemia   Expected date of contact: Emergent - 3 Days   Service: Pharmacy   Pharmacy Service For: Disease Management   Disease states to manage: Diabetes   Call MD for:   Complete by: As directed    Hypoglycemia   Diet - low sodium heart healthy   Complete by: As directed    Diet general   Complete by: As  directed    Discharge instructions   Complete by: As directed    Follow with Primary MD  Delbert Clam, MD in 1-2 weeks  Please get a complete blood count and chemistry panel checked by your Primary MD at your next visit, and again as instructed by your Primary MD.  Get Medicines reviewed and adjusted: Please take all your medications with you for your next visit with your Primary MD  Laboratory/radiological data: Please request your Primary MD to go over all hospital  tests and procedure/radiological results at the follow up, please ask your Primary MD to get all Hospital records sent to his/her office.  In some cases, they will be blood work, cultures and biopsy results pending at the time of your discharge. Please request that your primary care M.D. follows up on these results.  Also Note the following: If you experience worsening of your admission symptoms, develop shortness of breath, life threatening emergency, suicidal or homicidal thoughts you must seek medical attention immediately by calling 911 or calling your MD immediately  if symptoms less severe.  You must read complete instructions/literature along with all the possible adverse reactions/side effects for all the Medicines you take and that have been prescribed to you. Take any new Medicines after you have completely understood and accpet all the possible adverse reactions/side effects.   Do not drive when taking Pain medications or sleeping medications (Benzodaizepines)  Do not take more than prescribed Pain, Sleep and Anxiety Medications. It is not advisable to combine anxiety,sleep and pain medications without talking with your primary care practitioner  Special Instructions: If you have smoked or chewed Tobacco  in the last 2 yrs please stop smoking, stop any regular Alcohol  and or any Recreational drug use.  Wear Seat belts while driving.  Please note: You were cared for by a hospitalist during your hospital stay. Once you are discharged, your primary care physician will handle any further medical issues. Please note that NO REFILLS for any discharge medications will be authorized once you are discharged, as it is imperative that you return to your primary care physician (or establish a relationship with a primary care physician if you do not have one) for your post hospital discharge needs so that they can reassess your need for medications and monitor your lab values.   Increase activity  slowly   Complete by: As directed       Allergies as of 10/19/2023       Reactions   Shellfish Allergy Anaphylaxis, Hives   Aspirin  Other (See Comments)   Lump in armpit        Medication List     TAKE these medications    Accu-Chek Guide Test test strip Generic drug: glucose blood Use as directed 3 times daily to check blood sugar   Accu-Chek Guide w/Device Kit Use as directed to check blood sugar three times per day.   Accu-Chek Softclix Lancets lancets Use 3 (three) times daily to check blood sugar as directed.   acetaminophen  500 MG tablet Commonly known as: TYLENOL  Take 2 tablets (1,000 mg total) by mouth every 8 (eight) hours as needed for moderate pain (pain score 4-6), fever, mild pain (pain score 1-3) or headache.   amLODipine  5 MG tablet Commonly known as: NORVASC  Take 1 tablet (5 mg total) by mouth daily.   FreeStyle Libre 3 Plus Sensor Misc Use to check blood glucose continuously throughout the day. Change sensor every 15 days.   FreeStyle [place] 3  Reader Espiridion Use to check blood glucose continuously throughout the day.   Genvoya  150-150-200-10 MG Tabs tablet Generic drug: elvitegravir-cobicistat-emtricitabine-tenofovir TAKE 1 TABLET BY MOUTH DAILY WITH BREAKFAST   HumaLOG  KwikPen 100 UNIT/ML KwikPen Generic drug: insulin  lispro Inject 0-12 Units into the skin with breakfast, with lunch, and with evening meal. As per sliding scale.For blood sugars 0-150 give 0 units of insulin , 151-200 give 2 units of insulin , 201-250 give 4 units, 251-300 give 6 units, 301-350 give 8 units, 351-400 give 10 units,> 400 give 12 units   insulin  glargine 100 UNIT/ML Solostar Pen Commonly known as: LANTUS  Inject 14 Units into the skin daily. Start taking on: October 20, 2023 What changed: how much to take   [employer] Use as directed three times daily.   mirtazapine  15 MG tablet Commonly known as: Remeron  Take 0.5 tablets (7.5 mg total) by mouth at  bedtime. What changed:  when to take this reasons to take this   ondansetron  4 MG tablet Commonly known as: ZOFRAN  Take 1 tablet (4 mg total) by mouth every 8 (eight) hours as needed for nausea or vomiting.   sertraline  100 MG tablet Commonly known as: ZOLOFT  Take 1.5 tablets (150 mg total) by mouth daily.   TIGER BALM MUSCLE RUB EX Apply 1 Application topically as needed.   TRUEplus 5-Bevel Pen Needles 31G X 8 MM Misc Generic drug: Insulin  Pen Needle Use as directed three times daily.   valsartan  80 MG tablet Commonly known as: Diovan  Take 1 tablet (80 mg total) by mouth daily.        Follow-up Information     Delbert Clam, MD. Schedule an appointment as soon as possible for a visit in 1 week(s).   Specialty: Family Medicine Contact information: [address] [place] [address] [phone]                Allergies  Allergen Reactions   Shellfish Allergy Anaphylaxis and Hives   Aspirin  Other (See Comments)    Lump in armpit     Other Procedures/Studies: No results found.   TODAY-DAY OF DISCHARGE:  Subjective:   Brennen Kerce today has no headache,no chest abdominal pain,no new weakness tingling or numbness, feels much better wants to go home today.   Objective:   Blood pressure (!) 148/102, pulse 87, temperature 99 F (37.2 C), temperature source Oral, resp. rate 13, height 6' (1.829 m), weight 86.2 kg, SpO2 97%.  Intake/Output Summary (Last 24 hours) at 10/19/2023 0946 Last data filed at 10/18/2023 2100 Gross per 24 hour  Intake 480 ml  Output --  Net 480 ml   Filed Weights   10/16/23 2141  Weight: 86.2 kg    Exam: Awake Alert, Oriented *3, No new F.N deficits, Normal affect [place].AT,PERRAL Supple Neck,No JVD, No cervical lymphadenopathy appriciated.  Symmetrical Chest wall movement, Good air movement bilaterally, CTAB RRR,No Gallops,Rubs or new Murmurs, No Parasternal Heave +ve B.Sounds, Abd Soft, Non tender, No  organomegaly appriciated, No rebound -guarding or rigidity. No Cyanosis, Clubbing or edema, No new Rash or bruise   PERTINENT RADIOLOGIC STUDIES: No results found.   PERTINENT LAB RESULTS: CBC: Recent Labs    10/16/23 2209 10/17/23 1617  WBC 9.5 9.3  HGB 16.1 16.2  HCT 48.3 47.3  PLT 348 342   CMET CMP     Component Value Date/Time   NA 137 10/18/2023 0830   NA 139 01/06/2023 1639   K 4.6 10/18/2023 0830   CL  101 10/18/2023 0830   CO2 24 10/18/2023 0830   GLUCOSE 128 (H) 10/18/2023 0830   BUN 19 10/18/2023 0830   BUN 21 (H) 01/06/2023 1639   CREATININE 1.78 (H) 10/18/2023 0830   CREATININE 1.71 (H) 09/04/2023 0841   CALCIUM  9.6 10/18/2023 0830   PROT 6.6 10/17/2023 1617   ALBUMIN 3.4 (L) 10/17/2023 1617   AST 16 10/17/2023 1617   ALT 18 10/17/2023 1617   ALKPHOS 58 10/17/2023 1617   BILITOT 0.6 10/17/2023 1617   EGFR 46 (L) 03/24/2023 0927   EGFR 59 (L) 01/06/2023 1639   GFRNONAA 50 (L) 10/18/2023 0830   GFRNONAA 90 03/10/2020 0950    GFR Estimated Creatinine Clearance: 62.4 mL/min (A) (by C-G formula based on SCr of 1.78 mg/dL (H)). No results for input(s): LIPASE, AMYLASE in the last 72 hours. Recent Labs    10/16/23 2345  CKTOTAL 161   Invalid input(s): POCBNP No results for input(s): DDIMER in the last 72 hours. No results for input(s): HGBA1C in the last 72 hours. No results for input(s): CHOL, HDL, LDLCALC, TRIG, CHOLHDL, LDLDIRECT in the last 72 hours. Recent Labs    10/16/23 2209  TSH 3.272   No results for input(s): VITAMINB12, FOLATE, FERRITIN, TIBC, IRON, RETICCTPCT in the last 72 hours. Coags: No results for input(s): INR in the last 72 hours.  Invalid input(s): PT Microbiology: No results found for this or any previous visit (from the past 240 hours).  FURTHER DISCHARGE INSTRUCTIONS:  Get Medicines reviewed and adjusted: Please take all your medications with you for your next visit with your  Primary MD  Laboratory/radiological data: Please request your Primary MD to go over all hospital tests and procedure/radiological results at the follow up, please ask your Primary MD to get all Hospital records sent to his/her office.  In some cases, they will be blood work, cultures and biopsy results pending at the time of your discharge. Please request that your primary care M.D. goes through all the records of your hospital data and follows up on these results.  Also Note the following: If you experience worsening of your admission symptoms, develop shortness of breath, life threatening emergency, suicidal or homicidal thoughts you must seek medical attention immediately by calling 911 or calling your MD immediately  if symptoms less severe.  You must read complete instructions/literature along with all the possible adverse reactions/side effects for all the Medicines you take and that have been prescribed to you. Take any new Medicines after you have completely understood and accpet all the possible adverse reactions/side effects.   Do not drive when taking Pain medications or sleeping medications (Benzodaizepines)  Do not take more than prescribed Pain, Sleep and Anxiety Medications. It is not advisable to combine anxiety,sleep and pain medications without talking with your primary care practitioner  Special Instructions: If you have smoked or chewed Tobacco  in the last 2 yrs please stop smoking, stop any regular Alcohol  and or any Recreational drug use.  Wear Seat belts while driving.  Please note: You were cared for by a hospitalist during your hospital stay. Once you are discharged, your primary care physician will handle any further medical issues. Please note that NO REFILLS for any discharge medications will be authorized once you are discharged, as it is imperative that you return to your primary care physician (or establish a relationship with a primary care physician if you do  not have one) for your post hospital discharge needs so that they  can reassess your need for medications and monitor your lab values.  Total Time spent coordinating discharge including counseling, education and face to face time equals greater than 30 minutes.  SignedBETHA Donalda Applebaum 10/19/2023 9:46 AM

## 2023-10-20 ENCOUNTER — Telehealth: Payer: Self-pay | Admitting: Pharmacist

## 2023-10-20 NOTE — Telephone Encounter (Signed)
 Can we schedule this patient with Dr. Newlin or a covering provider as soon as possible for a hospital follow-up? I was outreached by our [facility]I team because this patient is struggling with hypoglycemia. However, Dr. Newlin will have to see him before I can make changes.

## 2023-10-20 NOTE — Telephone Encounter (Signed)
Thank you, friend!

## 2023-10-24 ENCOUNTER — Ambulatory Visit: Payer: MEDICAID | Attending: Family Medicine | Admitting: Family Medicine

## 2023-10-24 ENCOUNTER — Other Ambulatory Visit: Payer: Self-pay

## 2023-10-24 ENCOUNTER — Encounter: Payer: Self-pay | Admitting: Family Medicine

## 2023-10-24 VITALS — BP 131/89 | HR 92 | Ht 72.0 in | Wt 202.0 lb

## 2023-10-24 DIAGNOSIS — R519 Headache, unspecified: Secondary | ICD-10-CM | POA: Diagnosis not present

## 2023-10-24 DIAGNOSIS — N183 Chronic kidney disease, stage 3 unspecified: Secondary | ICD-10-CM

## 2023-10-24 DIAGNOSIS — E1022 Type 1 diabetes mellitus with diabetic chronic kidney disease: Secondary | ICD-10-CM

## 2023-10-24 DIAGNOSIS — N1832 Chronic kidney disease, stage 3b: Secondary | ICD-10-CM | POA: Diagnosis not present

## 2023-10-24 DIAGNOSIS — E10649 Type 1 diabetes mellitus with hypoglycemia without coma: Secondary | ICD-10-CM

## 2023-10-24 DIAGNOSIS — Z794 Long term (current) use of insulin: Secondary | ICD-10-CM | POA: Diagnosis not present

## 2023-10-24 DIAGNOSIS — I129 Hypertensive chronic kidney disease with stage 1 through stage 4 chronic kidney disease, or unspecified chronic kidney disease: Secondary | ICD-10-CM

## 2023-10-24 LAB — POCT GLYCOSYLATED HEMOGLOBIN (HGB A1C): HbA1c, POC (controlled diabetic range): 9.4 % — AB (ref 0.0–7.0)

## 2023-10-24 LAB — GLUCOSE, POCT (MANUAL RESULT ENTRY): POC Glucose: 68 mg/dL — AB (ref 70–99)

## 2023-10-24 MED ORDER — INSULIN GLARGINE 100 UNIT/ML SOLOSTAR PEN
12.0000 [IU] | PEN_INJECTOR | Freq: Every day | SUBCUTANEOUS | 2 refills | Status: DC
  Filled 2023-10-24: qty 30, 40d supply, fill #0
  Filled 2023-12-04: qty 12, 100d supply, fill #0

## 2023-10-24 MED ORDER — FREESTYLE LIBRE 3 READER DEVI
0 refills | Status: AC
  Filled 2023-10-24 – 2024-03-26 (×2): qty 1, 30d supply, fill #0
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
## 2023-10-24 NOTE — Patient Instructions (Signed)
Placed in [facility] Ph# [phone] Fax # [phone]. [address] [place], [place] [address]

## 2023-10-31 ENCOUNTER — Encounter: Payer: Self-pay | Admitting: Family Medicine

## 2023-11-02 ENCOUNTER — Other Ambulatory Visit: Payer: Self-pay

## 2023-11-03 ENCOUNTER — Other Ambulatory Visit: Payer: Self-pay

## 2023-11-07 ENCOUNTER — Ambulatory Visit
Admission: RE | Admit: 2023-11-07 | Discharge: 2023-11-07 | Disposition: A | Discharge status: Home / Self Care | Payer: MEDICAID | Source: Ambulatory Visit | Attending: Family Medicine | Admitting: Family Medicine

## 2023-11-07 DIAGNOSIS — R519 Headache, unspecified: Secondary | ICD-10-CM

## 2023-11-15 ENCOUNTER — Ambulatory Visit: Payer: Self-pay | Admitting: Family Medicine

## 2023-11-30 NOTE — Telephone Encounter (Signed)
 I called patient to inquire if he is still working with [facility] regarding the disability application and if he has been in contact with [facility] and I had to leave a message requesting a call back.

## 2023-12-04 ENCOUNTER — Other Ambulatory Visit: Payer: Self-pay

## 2023-12-05 ENCOUNTER — Other Ambulatory Visit: Payer: Self-pay

## 2023-12-21 ENCOUNTER — Other Ambulatory Visit: Payer: Self-pay

## 2023-12-27 ENCOUNTER — Other Ambulatory Visit: Payer: Self-pay

## 2023-12-28 ENCOUNTER — Other Ambulatory Visit: Payer: Self-pay

## 2023-12-28 ENCOUNTER — Other Ambulatory Visit: Payer: Self-pay | Admitting: Family Medicine

## 2023-12-28 DIAGNOSIS — E1022 Type 1 diabetes mellitus with diabetic chronic kidney disease: Secondary | ICD-10-CM

## 2023-12-28 DIAGNOSIS — Z794 Long term (current) use of insulin: Secondary | ICD-10-CM

## 2023-12-28 MED ORDER — INSULIN GLARGINE 100 UNIT/ML SOLOSTAR PEN
15.0000 [IU] | PEN_INJECTOR | Freq: Every day | SUBCUTANEOUS | 2 refills | Status: DC
  Filled 2023-12-28: qty 30, 200d supply, fill #0

## 2024-01-30 ENCOUNTER — Ambulatory Visit: Payer: MEDICAID | Attending: Family Medicine | Admitting: Family Medicine

## 2024-01-30 ENCOUNTER — Encounter: Payer: Self-pay | Admitting: Family Medicine

## 2024-01-30 ENCOUNTER — Other Ambulatory Visit: Payer: Self-pay

## 2024-01-30 VITALS — BP 134/89 | HR 81 | Temp 98.6°F | Ht 72.0 in | Wt 200.6 lb

## 2024-01-30 DIAGNOSIS — N1832 Chronic kidney disease, stage 3b: Secondary | ICD-10-CM

## 2024-01-30 DIAGNOSIS — B353 Tinea pedis: Secondary | ICD-10-CM | POA: Diagnosis not present

## 2024-01-30 DIAGNOSIS — E1022 Type 1 diabetes mellitus with diabetic chronic kidney disease: Secondary | ICD-10-CM | POA: Diagnosis not present

## 2024-01-30 DIAGNOSIS — I129 Hypertensive chronic kidney disease with stage 1 through stage 4 chronic kidney disease, or unspecified chronic kidney disease: Secondary | ICD-10-CM

## 2024-01-30 DIAGNOSIS — B888 Other specified infestations: Secondary | ICD-10-CM

## 2024-01-30 DIAGNOSIS — F339 Major depressive disorder, recurrent, unspecified: Secondary | ICD-10-CM

## 2024-01-30 DIAGNOSIS — N183 Chronic kidney disease, stage 3 unspecified: Secondary | ICD-10-CM

## 2024-01-30 DIAGNOSIS — E119 Type 2 diabetes mellitus without complications: Secondary | ICD-10-CM

## 2024-01-30 DIAGNOSIS — Z5941 Food insecurity: Secondary | ICD-10-CM

## 2024-01-30 DIAGNOSIS — Z59819 Housing instability, housed unspecified: Secondary | ICD-10-CM

## 2024-01-30 DIAGNOSIS — F332 Major depressive disorder, recurrent severe without psychotic features: Secondary | ICD-10-CM

## 2024-01-30 LAB — POCT GLYCOSYLATED HEMOGLOBIN (HGB A1C): HbA1c, POC (controlled diabetic range): 12.7 % — AB (ref 0.0–7.0)

## 2024-01-30 MED ORDER — FREESTYLE LIBRE 3 PLUS SENSOR MISC
6 refills | Status: AC
  Filled 2024-01-30: qty 2, 30d supply, fill #0
  Filled 2024-03-26 – 2024-03-27 (×2): qty 2, 30d supply, fill #1

## 2024-01-30 MED ORDER — TERBINAFINE HCL 1 % EX CREA
1.0000 | TOPICAL_CREAM | Freq: Two times a day (BID) | CUTANEOUS | 1 refills | Status: AC
  Filled 2024-01-30: qty 45, 23d supply, fill #0
  Filled 2024-02-07: qty 30, 15d supply, fill #0
  Filled 2024-03-26: qty 30, 15d supply, fill #1

## 2024-01-30 MED ORDER — INSULIN GLARGINE 100 UNIT/ML SOLOSTAR PEN
25.0000 [IU] | PEN_INJECTOR | Freq: Every day | SUBCUTANEOUS | 2 refills | Status: AC
  Filled 2024-01-30: qty 21, 84d supply, fill #0
  Filled 2024-03-26 – 2024-04-02 (×3): qty 21, 84d supply, fill #1

## 2024-01-30 MED ORDER — AMLODIPINE BESYLATE 5 MG PO TABS
5.0000 mg | ORAL_TABLET | Freq: Every day | ORAL | 1 refills | Status: AC
  Filled 2024-01-30 – 2024-02-26 (×2): qty 90, 90d supply, fill #0

## 2024-01-30 MED ORDER — VALSARTAN 80 MG PO TABS
80.0000 mg | ORAL_TABLET | Freq: Every day | ORAL | 1 refills | Status: AC
  Filled 2024-01-30 – 2024-03-27 (×2): qty 90, 90d supply, fill #0

## 2024-01-30 NOTE — Progress Notes (Signed)
 Subjective:  Patient ID: [name], male    DOB: 27-Aug-1985  Age: 38 y.o. MRN: [id number]  CC: Medical Management of Chronic Issues     Discussed the use of AI scribe software for clinical note transcription with the patient, who gave verbal consent to proceed.  History of Present Illness [name] is a 38 year old male with type 1 diabetes, HIV, MDD, homelessness hypertension  and stage 3 kidney disease who presents for follow-up on his diabetes management.  His A1c has increased from 9.4% to 12.7%. Home blood sugars are mostly in the 300s and sometimes reach 500. He uses Lantus  15 units daily and Humalog  0-12 units as needed, but fasting sugars remain in the 400-500 range and are around 300 at bedtime.  He has stage 3 kidney disease and saw nephrology once this year, with follow-up planned in six months.  He takes Zoloft  for depression but is not taking Mirtazapine  because his food availability is irregular. Housing instability and limited access to food make it hard for him to manage his conditions.  He also has severe itching and skin irritation from bed bug bites at the shelter where he lives.    Past Medical History:  Diagnosis Date   Anxiety    Asthma    Closed displaced comminuted fracture of shaft of right tibia 11/01/2017   Closed fracture of base of fifth metatarsal bone of right foot 11/13/2017   Closed fracture of medial malleolus of right ankle 10/31/2017   Depression    Headache    only w/stress (11/01/2017)   HIV (human immunodeficiency virus infection) (HCC)    Hypoglycemia due to insulin  06/27/2022   Migraine    a few/month (11/01/2017)   Refusal of blood transfusions as patient is Jehovah's Witness    Seizure (HCC)    only w/my low blood sugars  (11/01/2017)   Type I diabetes mellitus (HCC)     Past Surgical History:  Procedure Laterality Date   FRACTURE SURGERY     IM NAILING TIBIA Right 11/01/2017   INTRAMEDULLARY  (IM) NAIL TIBIALRightGeneral   ORIF ANKLE FRACTURE Right 11/01/2017   ORIF ANKLE FRACTURE Right 11/01/2017   Procedure: OPEN REDUCTION INTERNAL FIXATION (ORIF) ANKLE FRACTURE;  Surgeon: Kendal Franky SQUIBB, MD;  Location: [facility] OR;  Service: Orthopedics;  Laterality: Right;   TIBIA IM NAIL INSERTION Right 11/01/2017   Procedure: INTRAMEDULLARY (IM) NAIL TIBIAL;  Surgeon: Kendal Franky SQUIBB, MD;  Location: [facility] OR;  Service: Orthopedics;  Laterality: Right;    Family History  Problem Relation Age of Onset   Thyroid  disease Mother    Diabetes Mother    Breast cancer Maternal Grandmother    Diabetes Maternal Grandmother     Social History   Socioeconomic History   Marital status: Single    Spouse name: Not on file   Number of children: Not on file   Years of education: Not on file   Highest education level: GED or equivalent  Occupational History   Not on file  Tobacco Use   Smoking status: Former    Types: Cigars    Start date: 09/29/2015    Quit date: 12/16/2022    Years since quitting: 1.1   Smokeless tobacco: Never   Tobacco comments:    11/01/2017  2 cigarettes in ~ 6 month   Vaping Use   Vaping status: Former   Substances: THC  Substance and Sexual Activity   Alcohol use: Not Currently  Alcohol/week: 7.0 standard drinks of alcohol    Types: 7 Cans of beer per week   Drug use: Not Currently    Frequency: 2.0 times per week    Types: Marijuana    Comment: 11/01/2017  weekly   Sexual activity: Yes    Partners: Male    Comment: pt. declined condoms  Other Topics Concern   Not on file  Social History Narrative   Not on file   Social Drivers of Health   Financial Resource Strain: High Risk (01/29/2024)   Overall Financial Resource Strain (CARDIA)    Difficulty of Paying Living Expenses: Very hard  Food Insecurity: Food Insecurity Present (01/29/2024)   Hunger Vital Sign    Worried About Running Out of Food in the Last Year: Often true    Ran Out of Food in the Last Year:  Often true  Transportation Needs: Unmet Transportation Needs (01/29/2024)   PRAPARE - Administrator, Civil Service (Medical): Yes    Lack of Transportation (Non-Medical): Yes  Physical Activity: Insufficiently Active (01/29/2024)   Exercise Vital Sign    Days of Exercise per Week: 1 day    Minutes of Exercise per Session: 20 min  Stress: Stress Concern Present (01/29/2024)   [employer] of Occupational Health - Occupational Stress Questionnaire    Feeling of Stress: Very much  Social Connections: Socially Isolated (01/29/2024)   Social Connection and Isolation Panel    Frequency of Communication with Friends and Family: Once a week    Frequency of Social Gatherings with Friends and Family: Never    Attends Religious Services: Never    Database Administrator or Organizations: No    Attends Engineer, Structural: Not on file    Marital Status: Never married    Allergies  Allergen Reactions   Shellfish Allergy Anaphylaxis and Hives   Aspirin  Other (See Comments)    Lump in armpit    Outpatient Medications Prior to Visit  Medication Sig Dispense Refill   acetaminophen  (TYLENOL ) 500 MG tablet Take 2 tablets (1,000 mg total) by mouth every 8 (eight) hours as needed for moderate pain (pain score 4-6), fever, mild pain (pain score 1-3) or headache. 30 tablet 0   Blood Glucose Monitoring Suppl (BLOOD GLUCOSE MONITOR SYSTEM) w/Device KIT Use as directed to check blood sugar three times per day. 1 kit 0   Camphor-Menthol-Methyl Sal (TIGER BALM MUSCLE RUB EX) Apply 1 Application topically as needed.     Continuous Glucose Receiver (FREESTYLE LIBRE 3 READER) DEVI Use to check blood glucose continuously throughout the day. 1 each 0   GENVOYA  150-150-200-10 MG TABS tablet TAKE 1 TABLET BY MOUTH DAILY WITH BREAKFAST 30 tablet 0   Glucose Blood (BLOOD GLUCOSE TEST STRIPS) STRP Use as directed 3 times daily to check blood sugar 100 strip 0   insulin  lispro (HUMALOG ) 100  UNIT/ML KwikPen Inject 0-12 Units into the skin with breakfast, with lunch, and with evening meal. As per sliding scale.For blood sugars 0-150 give 0 units of insulin , 151-200 give 2 units of insulin , 201-250 give 4 units, 251-300 give 6 units, 301-350 give 8 units, 351-400 give 10 units,> 400 give 12 units 30 mL 2   Insulin  Pen Needle 31G X 8 MM MISC Use as directed three times daily. 100 each 0   Lancet Device MISC Use as directed three times daily. 1 each 0   Lancets MISC Use 3 (three) times daily to check blood sugar as directed.  100 each 0   sertraline  (ZOLOFT ) 100 MG tablet Take 1.5 tablets (150 mg total) by mouth daily. 135 tablet 1   amLODipine  (NORVASC ) 5 MG tablet Take 1 tablet (5 mg total) by mouth daily. 90 tablet 1   Continuous Glucose Sensor (FREESTYLE LIBRE 3 PLUS SENSOR) MISC Use to check blood glucose continuously throughout the day. Change sensor every 15 days. 2 each 6   insulin  glargine (LANTUS ) 100 UNIT/ML Solostar Pen Inject 15 Units into the skin daily. 30 mL 2   valsartan  (DIOVAN ) 80 MG tablet Take 1 tablet (80 mg total) by mouth daily. 90 tablet 1   mirtazapine  (REMERON ) 15 MG tablet Take 0.5 tablets (7.5 mg total) by mouth at bedtime. (Patient not taking: Reported on 01/30/2024) 90 tablet 1   ondansetron  (ZOFRAN ) 4 MG tablet Take 1 tablet (4 mg total) by mouth every 8 (eight) hours as needed for nausea or vomiting. (Patient not taking: Reported on 01/30/2024) 12 tablet 0   No facility-administered medications prior to visit.     ROS Review of Systems  Constitutional:  Negative for activity change and appetite change.  HENT:  Negative for sinus pressure and sore throat.   Respiratory:  Negative for chest tightness, shortness of breath and wheezing.   Cardiovascular:  Negative for chest pain and palpitations.  Gastrointestinal:  Negative for abdominal distention, abdominal pain and constipation.  Genitourinary: Negative.   Musculoskeletal: Negative.    Psychiatric/Behavioral:  Positive for dysphoric mood. Negative for behavioral problems.     Objective:  BP 134/89   Pulse 81   Temp 98.6 F (37 C) (Oral)   Ht 6' (1.829 m)   Wt 200 lb 9.6 oz (91 kg)   SpO2 100%   BMI 27.21 kg/m      01/30/2024    9:05 AM 10/24/2023   10:11 AM 10/24/2023    9:15 AM  BP/Weight  Systolic BP 134 131 145  Diastolic BP 89 89 90  Wt. (Lbs) 200.6  202  BMI 27.21 kg/m2  27.4 kg/m2      Physical Exam Constitutional:      Appearance: He is well-developed.  Cardiovascular:     Rate and Rhythm: Normal rate.     Heart sounds: Normal heart sounds. No murmur heard. Pulmonary:     Effort: Pulmonary effort is normal.     Breath sounds: Normal breath sounds. No wheezing or rales.  Chest:     Chest wall: No tenderness.  Abdominal:     General: Bowel sounds are normal. There is no distension.     Palpations: Abdomen is soft. There is no mass.     Tenderness: There is no abdominal tenderness.  Musculoskeletal:        General: Normal range of motion.     Right lower leg: No edema.     Left lower leg: No edema.  Neurological:     Mental Status: He is alert and oriented to person, place, and time.  Psychiatric:     Comments: Dysphoric mood    Diabetic Foot Exam - Simple   Simple Foot Form Diabetic Foot exam was performed with the following findings: Yes 01/30/2024  9:32 AM  Visual Inspection See comments: Yes Sensation Testing Intact to touch and monofilament testing bilaterally: Yes Pulse Check Posterior Tibialis and Dorsalis pulse intact bilaterally: Yes Comments Tinea pedis in left foot - 2nd, 3rd, 4th web space and right foot - 3rd, 4th web spacess No callus or ulcerations  Latest Ref Rng & Units 10/18/2023    8:30 AM 10/17/2023    4:17 PM 10/16/2023   11:45 PM  CMP  Glucose 70 - 99 mg/dL 871  814    BUN 6 - 20 mg/dL 19  16    Creatinine 9.38 - 1.24 mg/dL 8.21  8.24    Sodium 864 - 145 mmol/L 137  135    Potassium 3.5 - 5.1  mmol/L 4.6  4.6    Chloride 98 - 111 mmol/L 101  103    CO2 22 - 32 mmol/L 24  24    Calcium  8.9 - 10.3 mg/dL 9.6  9.4    Total Protein 6.5 - 8.1 g/dL  6.6  7.1   Total Bilirubin 0.0 - 1.2 mg/dL  0.6  0.6   Alkaline Phos 38 - 126 U/L  58  59   AST 15 - 41 U/L  16  21   ALT 0 - 44 U/L  18  22     Lipid Panel     Component Value Date/Time   CHOL 191 03/24/2023 0927   CHOL 169 02/10/2022 1223   TRIG 69 03/24/2023 0927   HDL 95 03/24/2023 0927   HDL 99 02/10/2022 1223   CHOLHDL 2.0 03/24/2023 0927   VLDL 10 09/24/2020 1434   LDLCALC 81 03/24/2023 0927    CBC    Component Value Date/Time   WBC 9.3 10/17/2023 1617   RBC 5.28 10/17/2023 1617   HGB 16.2 10/17/2023 1617   HCT 47.3 10/17/2023 1617   PLT 342 10/17/2023 1617   MCV 89.6 10/17/2023 1617   MCH 30.7 10/17/2023 1617   MCHC 34.2 10/17/2023 1617   RDW 14.0 10/17/2023 1617   LYMPHSABS 1.4 10/16/2023 2209   MONOABS 0.7 10/16/2023 2209   EOSABS 0.0 10/16/2023 2209   BASOSABS 0.0 10/16/2023 2209    Lab Results  Component Value Date   HGBA1C 12.7 (A) 01/30/2024    Lab Results  Component Value Date   HGBA1C 12.7 (A) 01/30/2024   HGBA1C 9.4 (A) 10/24/2023   HGBA1C 9.4 (H) 06/01/2023       Assessment & Plan Type 1 diabetes mellitus with stage 3b chronic kidney disease A1c increased to 12.7, indicating poor glycemic control; goal is less than 7.0. Blood glucose levels consistently high. Diabetes affecting kidney function. - Increased Lantus  to 25 units from 15 units daily. - Continue Humalog  on sliding scale with meals. - Encouraged follow-up with endocrinologist (referral previously placed) - Ordered urine test for microalbumin. - Ensure follow-up with nephrologist.  Hypertensive chronic kidney disease, stage 3b Blood pressure well-controlled.  Hypertension and diabetes affecting kidney function. - Refilled blood pressure medication. -Avoid nephrotoxins - Ensure follow-up with nephrologist.  Depressive  disorder Not taking Zoloft . Metazepine prescribed for appetite but not used due to food availability issues. -Underlying social issues including homelessness and financial challenges - Continue Zoloft  as prescribed. - Maintain Metazepine on medication list.  Tinea pedis Athlete's foot between toes, likely due to wet shoes. No lotion applied between toes. - Prescribed Lamisil  cream for application between toes. - Advised air circulation and using tissue paper to separate toes.   Bedbug infestation Unfortunately he resides at the shelter and will need exterminator for this - Advised to discuss this with shelter management   Meds ordered this encounter  Medications   amLODipine  (NORVASC ) 5 MG tablet    Sig: Take 1 tablet (5 mg total) by mouth daily.    Dispense:  90  tablet    Refill:  1   Continuous Glucose Sensor (FREESTYLE LIBRE 3 PLUS SENSOR) MISC    Sig: Use to check blood glucose continuously throughout the day. Change sensor every 15 days.    Dispense:  2 each    Refill:  6   valsartan  (DIOVAN ) 80 MG tablet    Sig: Take 1 tablet (80 mg total) by mouth daily.    Dispense:  90 tablet    Refill:  1   insulin  glargine (LANTUS ) 100 UNIT/ML Solostar Pen    Sig: Inject 25 Units into the skin daily.    Dispense:  30 mL    Refill:  2    May substitute as needed per insurance.   terbinafine  (LAMISIL  AT) 1 % cream    Sig: Apply 1 Application topically 2 (two) times daily.    Dispense:  42 g    Refill:  1    Follow-up: Return in about 3 months (around 04/29/2024) for Chronic medical conditions.       Corrina Sabin, MD, FAAFP. [facility] [place], [place] [phone]   01/30/2024, 5:19 PM

## 2024-01-30 NOTE — Patient Instructions (Signed)
 Good Afternoon   We have attempted to reach you by telephone to re-schedule your appointment with Dr Dartha on Tuesday, February 27, 2024 at 1:00 PM.  Dr Dartha is going to be out of the office this day and we must reschedule the appointment mentioned above.  Please call [phone] press option 2 to reach scheduling.  Thank you.

## 2024-02-01 ENCOUNTER — Ambulatory Visit: Payer: Self-pay | Admitting: Family Medicine

## 2024-02-01 LAB — MICROALBUMIN / CREATININE URINE RATIO
Creatinine, Urine: 90.1 mg/dL
Microalb/Creat Ratio: 1292 mg/g{creat} — ABNORMAL HIGH (ref 0–29)
Microalbumin, Urine: 1163.9 ug/mL

## 2024-02-06 ENCOUNTER — Other Ambulatory Visit: Payer: Self-pay | Admitting: Family

## 2024-02-06 ENCOUNTER — Other Ambulatory Visit: Payer: Self-pay

## 2024-02-06 DIAGNOSIS — Z21 Asymptomatic human immunodeficiency virus [HIV] infection status: Secondary | ICD-10-CM

## 2024-02-07 ENCOUNTER — Other Ambulatory Visit: Payer: Self-pay

## 2024-02-07 ENCOUNTER — Telehealth: Payer: Self-pay

## 2024-02-07 DIAGNOSIS — Z21 Asymptomatic human immunodeficiency virus [HIV] infection status: Secondary | ICD-10-CM

## 2024-02-07 MED ORDER — GENVOYA 150-150-200-10 MG PO TABS: 1.0000 | ORAL_TABLET | Freq: Every day | ORAL | 0 refills | Status: AC

## 2024-02-07 NOTE — Telephone Encounter (Signed)
 Patient has been scheduled for first available with Cordella July 03/01/24, Requesting 30 day supply of Genvoya  to be sent to [facility] on [address]. Best contact number is [phone]

## 2024-02-08 ENCOUNTER — Other Ambulatory Visit: Payer: Self-pay

## 2024-02-24 ENCOUNTER — Emergency Department (HOSPITAL_COMMUNITY): Payer: MEDICAID

## 2024-02-24 ENCOUNTER — Emergency Department (HOSPITAL_COMMUNITY)
Admission: EM | Admit: 2024-02-24 | Discharge: 2024-02-24 | Disposition: A | Discharge status: Home / Self Care | Payer: MEDICAID | Attending: Emergency Medicine | Admitting: Emergency Medicine

## 2024-02-24 ENCOUNTER — Encounter (HOSPITAL_COMMUNITY): Payer: Self-pay

## 2024-02-24 ENCOUNTER — Other Ambulatory Visit: Payer: Self-pay

## 2024-02-24 DIAGNOSIS — R109 Unspecified abdominal pain: Secondary | ICD-10-CM | POA: Diagnosis not present

## 2024-02-24 DIAGNOSIS — R61 Generalized hyperhidrosis: Secondary | ICD-10-CM | POA: Insufficient documentation

## 2024-02-24 DIAGNOSIS — J101 Influenza due to other identified influenza virus with other respiratory manifestations: Secondary | ICD-10-CM | POA: Insufficient documentation

## 2024-02-24 DIAGNOSIS — R319 Hematuria, unspecified: Secondary | ICD-10-CM | POA: Insufficient documentation

## 2024-02-24 DIAGNOSIS — E119 Type 2 diabetes mellitus without complications: Secondary | ICD-10-CM | POA: Insufficient documentation

## 2024-02-24 DIAGNOSIS — Z79899 Other long term (current) drug therapy: Secondary | ICD-10-CM | POA: Diagnosis not present

## 2024-02-24 DIAGNOSIS — I10 Essential (primary) hypertension: Secondary | ICD-10-CM | POA: Diagnosis not present

## 2024-02-24 DIAGNOSIS — R059 Cough, unspecified: Secondary | ICD-10-CM | POA: Diagnosis present

## 2024-02-24 DIAGNOSIS — Z21 Asymptomatic human immunodeficiency virus [HIV] infection status: Secondary | ICD-10-CM | POA: Insufficient documentation

## 2024-02-24 DIAGNOSIS — Z794 Long term (current) use of insulin: Secondary | ICD-10-CM | POA: Insufficient documentation

## 2024-02-24 LAB — URINALYSIS, ROUTINE W REFLEX MICROSCOPIC
Bilirubin Urine: NEGATIVE
Glucose, UA: 250 mg/dL — AB
Ketones, ur: 15 mg/dL — AB
Leukocytes,Ua: NEGATIVE
Nitrite: NEGATIVE
Protein, ur: >300 mg/dL — AB
Specific Gravity, Urine: 1.02 (ref 1.005–1.030)
pH: 8 (ref 5.0–8.0)

## 2024-02-24 LAB — CBC
HCT: 40.5 % (ref 39.0–52.0)
Hemoglobin: 13.9 g/dL (ref 13.0–17.0)
MCH: 30.2 pg (ref 26.0–34.0)
MCHC: 34.3 g/dL (ref 30.0–36.0)
MCV: 88 fL (ref 80.0–100.0)
Platelets: 193 K/uL (ref 150–400)
RBC: 4.6 MIL/uL (ref 4.22–5.81)
RDW: 13.8 % (ref 11.5–15.5)
WBC: 9.5 K/uL (ref 4.0–10.5)
nRBC: 0 % (ref 0.0–0.2)

## 2024-02-24 LAB — COMPREHENSIVE METABOLIC PANEL WITH GFR
ALT: 29 U/L (ref 0–44)
AST: 44 U/L — ABNORMAL HIGH (ref 15–41)
Albumin: 4 g/dL (ref 3.5–5.0)
Alkaline Phosphatase: 86 U/L (ref 38–126)
Anion gap: 9 (ref 5–15)
BUN: 21 mg/dL — ABNORMAL HIGH (ref 6–20)
CO2: 26 mmol/L (ref 22–32)
Calcium: 9.3 mg/dL (ref 8.9–10.3)
Chloride: 100 mmol/L (ref 98–111)
Creatinine, Ser: 1.73 mg/dL — ABNORMAL HIGH (ref 0.61–1.24)
GFR, Estimated: 51 mL/min — ABNORMAL LOW
Glucose, Bld: 182 mg/dL — ABNORMAL HIGH (ref 70–99)
Potassium: 4.5 mmol/L (ref 3.5–5.1)
Sodium: 135 mmol/L (ref 135–145)
Total Bilirubin: 0.4 mg/dL (ref 0.0–1.2)
Total Protein: 7.1 g/dL (ref 6.5–8.1)

## 2024-02-24 LAB — RESP PANEL BY RT-PCR (RSV, FLU A&B, COVID)  RVPGX2
Influenza A by PCR: POSITIVE — AB
Influenza B by PCR: NEGATIVE
Resp Syncytial Virus by PCR: NEGATIVE
SARS Coronavirus 2 by RT PCR: NEGATIVE

## 2024-02-24 LAB — LIPASE, BLOOD: Lipase: 27 U/L (ref 11–51)

## 2024-02-24 LAB — URINALYSIS, MICROSCOPIC (REFLEX): Bacteria, UA: NONE SEEN

## 2024-02-24 MED ORDER — ONDANSETRON 4 MG PO TBDP
8.0000 mg | ORAL_TABLET | Freq: Once | ORAL | Status: AC
  Administered 2024-02-24: 8 mg via ORAL
  Filled 2024-02-24: qty 2

## 2024-02-24 MED ORDER — ONDANSETRON 4 MG PO TBDP
4.0000 mg | ORAL_TABLET | Freq: Three times a day (TID) | ORAL | 0 refills | Status: AC | PRN
  Filled 2024-02-24 – 2024-02-26 (×2): qty 12, 4d supply, fill #0

## 2024-02-24 MED ORDER — ONDANSETRON HCL 4 MG/2ML IJ SOLN
4.0000 mg | Freq: Once | INTRAMUSCULAR | Status: AC
  Administered 2024-02-24: 4 mg via INTRAVENOUS
  Filled 2024-02-24: qty 2

## 2024-02-24 MED ORDER — SODIUM CHLORIDE 0.9 % IV BOLUS
1000.0000 mL | Freq: Once | INTRAVENOUS | Status: AC
  Administered 2024-02-24: 1000 mL via INTRAVENOUS

## 2024-02-24 NOTE — ED Notes (Signed)
 Gave patient a cup of water. Sipping on it slowly.

## 2024-02-24 NOTE — ED Provider Notes (Signed)
 " [place] EMERGENCY DEPARTMENT AT [facility] Provider Note   CSN: [id number] Arrival date & time: 02/24/24  0013     Patient presents with: Abdominal Pain   Shaun Danirus Manas is a 38 y.o. male.   38 yo male with the flu (living at facility where flu has been spreading around). Here with 3 days of cough, fevers, vomiting.  DM, HIV (Cd4 greater than 200).        Prior to Admission medications  Medication Sig Start Date End Date Taking? Authorizing Provider  ondansetron  (ZOFRAN -ODT) 4 MG disintegrating tablet Take 1 tablet (4 mg total) by mouth every 8 (eight) hours as needed for nausea or vomiting. 02/24/24  Yes Beverley Leita LABOR, PA-C  acetaminophen  (TYLENOL ) 500 MG tablet Take 2 tablets (1,000 mg total) by mouth every 8 (eight) hours as needed for moderate pain (pain score 4-6), fever, mild pain (pain score 1-3) or headache. 05/31/23   Nivia Colon, PA-C  amLODipine  (NORVASC ) 5 MG tablet Take 1 tablet (5 mg total) by mouth daily. 01/30/24   Newlin, Enobong, MD  Blood Glucose Monitoring Suppl (BLOOD GLUCOSE MONITOR SYSTEM) w/Device KIT Use as directed to check blood sugar three times per day. 06/03/23   Elgergawy, Brayton RAMAN, MD  Camphor-Menthol-Methyl Sal (TIGER BALM MUSCLE RUB EX) Apply 1 Application topically as needed.    [provider]  Continuous Glucose Receiver (FREESTYLE LIBRE 3 READER) DEVI Use to check blood glucose continuously throughout the day. 10/24/23   Newlin, Enobong, MD  Continuous Glucose Sensor (FREESTYLE LIBRE 3 PLUS SENSOR) MISC Use to check blood glucose continuously throughout the day. Change sensor every 15 days. 01/30/24   Newlin, Enobong, MD  elvitegravir-cobicistat-emtricitabine-tenofovir (GENVOYA ) 150-150-200-10 MG TABS tablet Take 1 tablet by mouth daily with breakfast. 02/07/24   Calone, Gregory D, FNP  Glucose Blood (BLOOD GLUCOSE TEST STRIPS) STRP Use as directed 3 times daily to check blood sugar 06/03/23   Elgergawy, Brayton RAMAN, MD   insulin  glargine (LANTUS ) 100 UNIT/ML Solostar Pen Inject 25 Units into the skin daily. 01/30/24   Newlin, Enobong, MD  insulin  lispro (HUMALOG ) 100 UNIT/ML KwikPen Inject 0-12 Units into the skin with breakfast, with lunch, and with evening meal. As per sliding scale.For blood sugars 0-150 give 0 units of insulin , 151-200 give 2 units of insulin , 201-250 give 4 units, 251-300 give 6 units, 301-350 give 8 units, 351-400 give 10 units,> 400 give 12 units 08/29/23   Newlin, Enobong, MD  Insulin  Pen Needle 31G X 8 MM MISC Use as directed three times daily. 06/03/23   Elgergawy, Brayton RAMAN, MD  Lancet Device MISC Use as directed three times daily. 06/03/23   Elgergawy, Brayton RAMAN, MD  Lancets MISC Use 3 (three) times daily to check blood sugar as directed. 06/03/23   Elgergawy, Brayton RAMAN, MD  mirtazapine  (REMERON ) 15 MG tablet Take 0.5 tablets (7.5 mg total) by mouth at bedtime. Patient not taking: Reported on 01/30/2024 08/29/23   Newlin, Enobong, MD  ondansetron  (ZOFRAN ) 4 MG tablet Take 1 tablet (4 mg total) by mouth every 8 (eight) hours as needed for nausea or vomiting. Patient not taking: Reported on 01/30/2024 05/31/23   Nivia Colon, PA-C  sertraline  (ZOLOFT ) 100 MG tablet Take 1.5 tablets (150 mg total) by mouth daily. 08/29/23   Delbert Clam, MD  terbinafine  (LAMISIL  AT) 1 % cream Apply 1 Application topically 2 (two) times daily. 01/30/24   Newlin, Enobong, MD  valsartan  (DIOVAN ) 80 MG tablet Take 1 tablet (80  mg total) by mouth daily. 01/30/24   Newlin, Enobong, MD  pravastatin  (PRAVACHOL ) 20 MG tablet Take 1 tablet (20 mg total) by mouth daily. Patient not taking: Reported on 03/31/2019 03/08/19 07/08/19  Fleming, Zelda W, NP    Allergies: Shellfish allergy and Aspirin     Review of Systems Negative except as per Updated Vital Signs BP (!) 166/83 (BP Location: Right Arm)   Pulse 84   Temp 98.9 F (37.2 C) (Oral)   Resp 20   Ht 6' (1.829 m)   Wt 91.2 kg   SpO2 97%   BMI 27.26 kg/m   Physical  Exam Vitals and nursing note reviewed.  Constitutional:      General: He is not in acute distress.    Appearance: He is well-developed. He is diaphoretic.  HENT:     Head: Normocephalic and atraumatic.     Nose: Congestion present.     Mouth/Throat:     Mouth: Mucous membranes are moist.  Eyes:     Conjunctiva/sclera: Conjunctivae normal.  Cardiovascular:     Rate and Rhythm: Normal rate and regular rhythm.     Heart sounds: Normal heart sounds.  Pulmonary:     Effort: Pulmonary effort is normal.     Breath sounds: Normal breath sounds.  Abdominal:     Palpations: Abdomen is soft.     Tenderness: There is no abdominal tenderness.  Musculoskeletal:     Cervical back: Neck supple.  Skin:    General: Skin is warm.  Neurological:     Mental Status: He is alert and oriented to person, place, and time.  Psychiatric:        Behavior: Behavior normal.     (all labs ordered are listed, but only abnormal results are displayed) Labs Reviewed  RESP PANEL BY RT-PCR (RSV, FLU A&B, COVID)  RVPGX2 - Abnormal; Notable for the following components:      Result Value   Influenza A by PCR POSITIVE (*)    All other components within normal limits  COMPREHENSIVE METABOLIC PANEL WITH GFR - Abnormal; Notable for the following components:   Glucose, Bld 182 (*)    BUN 21 (*)    Creatinine, Ser 1.73 (*)    AST 44 (*)    GFR, Estimated 51 (*)    All other components within normal limits  URINALYSIS, ROUTINE W REFLEX MICROSCOPIC - Abnormal; Notable for the following components:   APPearance HAZY (*)    Glucose, UA 250 (*)    Hgb urine dipstick MODERATE (*)    Ketones, ur 15 (*)    Protein, ur >300 (*)    All other components within normal limits  LIPASE, BLOOD  CBC  URINALYSIS, MICROSCOPIC (REFLEX)    EKG: None  Radiology: DG Chest 2 View Result Date: 02/24/2024 EXAM: 2 VIEW(S) XRAY OF THE CHEST 02/24/2024 03:46:00 AM COMPARISON: 06/01/2023 CLINICAL HISTORY: fever, cough, flu,  immunocompromised FINDINGS: LUNGS AND PLEURA: No focal pulmonary opacity. No pleural effusion. No pneumothorax. HEART AND MEDIASTINUM: No acute abnormality of the cardiac and mediastinal silhouettes. BONES AND SOFT TISSUES: No acute osseous abnormality. IMPRESSION: 1. No acute process. Electronically signed by: Dorethia Molt MD 02/24/2024 04:07 AM EST RP Workstation: [id number]     Procedures   Medications Ordered in the ED  ondansetron  (ZOFRAN ) injection 4 mg (4 mg Intravenous Given 02/24/24 0134)  sodium chloride  0.9 % bolus 1,000 mL (0 mLs Intravenous Stopped 02/24/24 0249)  Medical Decision Making Amount and/or Complexity of Data Reviewed Labs: ordered. Radiology: ordered.  Risk Prescription drug management.   This patient presents to the ED for concern of vomiting, fevers, cough, this involves an extensive number of treatment options, and is a complaint that carries with it a high risk of complications and morbidity.  The differential diagnosis includes but not limited to viral illness, metabolic/electrolyte, PNA    Co morbidities / Chronic conditions that complicate the patient evaluation  HIV, DM, HTN, seizure,    Additional history obtained:  Additional history obtained from EMR External records from outside source obtained and reviewed including prior labs and imaging  CD4 count on August 19 of 655.   Lab Tests:  I Ordered, and personally interpreted labs.  The pertinent results include: CBC normal.  CMP with creatinine 1.7, similar to prior on file.  Lipase normal.  Urinalysis with ketones and protein as well as hemoglobin and RBC.  Negative for COVID, RSV, flu B.  Is positive for influenza A.   Imaging Studies ordered:  I ordered imaging studies including CXR  I independently visualized and interpreted imaging which showed no acute process I agree with the radiologist interpretation   Problem List / ED Course / Critical  interventions / Medication management  38 year old male with medical history significant for HIV and diabetes presents via EMS with vomiting, fever, cough.  He is provided with IV fluids and Zofran .  He is tolerating p.o. fluids his labs are generally stable are reassuring, and his bicarb and gap are normal, doubt DKA.  He is positive for influenza A.  His urinalysis has a mod amount of hemoglobin with large amount of red blood cells.  He is referred to PCP for recheck on this, no complaints of flank pain, abdomen soft and nontender, doubt kidney stone at this time.  Discharged with Zofran .  Symptoms greater than 3 days, I do not feel like Tamiflu would benefit him and may cause worsening of his current GI complaints. I ordered medication including IVF, zofran    Reevaluation of the patient after these medicines showed that the patient tolerating POs I have reviewed the patients home medicines and have made adjustments as needed   Social Determinants of Health:  Lives at shelter   Test / Admission - Considered:  Stable for dc      Final diagnoses:  Influenza A  Hematuria, unspecified type    ED Discharge Orders          Ordered    ondansetron  (ZOFRAN -ODT) 4 MG disintegrating tablet  Every 8 hours PRN        02/24/24 0417               Beverley Leita LABOR, PA-C 02/24/24 0419    Theadore Ozell HERO, MD 02/24/24 [phone]  "

## 2024-02-24 NOTE — ED Triage Notes (Signed)
 Pt complaining of nausea and vomiting for the last 3 days. [facility] where flu has been making its way through the occupants.

## 2024-02-24 NOTE — ED Notes (Signed)
 Attempted to draw labs, unsuccessful at this time.

## 2024-02-24 NOTE — Discharge Instructions (Addendum)
 Prescription for Zofran  has been sent to your pharmacy to help with nausea and vomiting.  Please follow-up with your primary care provider to recheck for blood in your urine tonight.  Home to rest and hydrate.  Motrin  and Tylenol  as needed directed.

## 2024-02-25 ENCOUNTER — Other Ambulatory Visit: Payer: Self-pay

## 2024-02-26 ENCOUNTER — Other Ambulatory Visit: Payer: Self-pay

## 2024-02-27 ENCOUNTER — Ambulatory Visit: Payer: MEDICAID | Admitting: "Endocrinology

## 2024-02-27 ENCOUNTER — Encounter: Payer: Self-pay | Admitting: Critical Care Medicine

## 2024-02-27 NOTE — Progress Notes (Signed)
 This patient is seen in the [facility] clinic he came into the isolation yesterday because of being positive for influenza A his symptoms were several for 5 days ago he is rapidly improving he can be cleared to go out of the isolation area tomorrow morning based on his improvement today

## 2024-03-01 ENCOUNTER — Encounter: Payer: Self-pay | Admitting: Family

## 2024-03-01 ENCOUNTER — Ambulatory Visit: Payer: MEDICAID | Admitting: Family

## 2024-03-01 ENCOUNTER — Other Ambulatory Visit: Payer: Self-pay

## 2024-03-01 ENCOUNTER — Other Ambulatory Visit (HOSPITAL_COMMUNITY): Payer: Self-pay

## 2024-03-01 VITALS — BP 159/91 | HR 92 | Temp 99.0°F | Ht 72.0 in | Wt 200.0 lb

## 2024-03-01 DIAGNOSIS — B20 Human immunodeficiency virus [HIV] disease: Secondary | ICD-10-CM | POA: Diagnosis not present

## 2024-03-01 DIAGNOSIS — Z1331 Encounter for screening for depression: Secondary | ICD-10-CM

## 2024-03-01 DIAGNOSIS — Z21 Asymptomatic human immunodeficiency virus [HIV] infection status: Secondary | ICD-10-CM

## 2024-03-01 DIAGNOSIS — Z0000 Encounter for general adult medical examination without abnormal findings: Secondary | ICD-10-CM

## 2024-03-01 DIAGNOSIS — A539 Syphilis, unspecified: Secondary | ICD-10-CM

## 2024-03-01 NOTE — Progress Notes (Signed)
 "   Brief Narrative   Patient ID: [name], male    DOB: [birth year], 39 y.o.   MRN: [id number]  [name] is a 39 y/o male diagnosed with HIV disease in July 2016 with risk factor of MSM. Initial viral load of 8,832 and CD4 count 330. Genotype with K103N (Efavirenz, Nevirapine). No history of opportunistic infection. Entered care at [facility] Stage 2. ART experienced with Genovya.   Subjective:   Chief Complaint  Patient presents with   Follow-up    B20 Nausea- getting over the flu     HPI:  [name] is a 39 y.o. male with HIV disease last seen on 05/29/2023 by Charlott Flowers, PharmD, CPP with well-controlled virus and good adherence and tolerance to Biktarvy.  Viral load was undetectable with CD4 count 573.  Kidney function slightly increased with creatinine 1.9 and estimated GFR of 46 consistent with chronic kidney disease stage IIIa.  In the interim has been hospitalized for diabetes.  Syphilis titer stable between 1: 32 and 1: 16.  Negative for gonorrhea and chlamydia.  Here today for routine follow-up.  [name] has been doing okay since his last office visit and continues to take Genvoya  as prescribed with increased nausea over the past 3 months and has also noted adjustments to his diabetes medications which may also be contributing.  Has nausea in the morning with occasional vomiting.  Was off medication for a brief period of time.  Housing is stable with access to food adequate and uses public transportation.  Working office manager.  Sexually active with condoms and site-specific STD testing offered.  Healthcare maintenance reviewed.  Recently diagnosed with influenza and recovering slowly.  Denies fevers, chills, night sweats, headaches, changes in vision, neck pain/stiffness, nausea, diarrhea, vomiting, lesions or rashes.  Lab Results  Component Value Date   CD4TCELL 37 10/17/2023   CD4TABS 665 10/17/2023   Lab Results  Component Value Date    HIV1RNAQUANT <20 10/17/2023     Allergies[1]    Outpatient Medications Prior to Visit  Medication Sig Dispense Refill   acetaminophen  (TYLENOL ) 500 MG tablet Take 2 tablets (1,000 mg total) by mouth every 8 (eight) hours as needed for moderate pain (pain score 4-6), fever, mild pain (pain score 1-3) or headache. 30 tablet 0   amLODipine  (NORVASC ) 5 MG tablet Take 1 tablet (5 mg total) by mouth daily. 90 tablet 1   Blood Glucose Monitoring Suppl (BLOOD GLUCOSE MONITOR SYSTEM) w/Device KIT Use as directed to check blood sugar three times per day. 1 kit 0   Camphor-Menthol-Methyl Sal (TIGER BALM MUSCLE RUB EX) Apply 1 Application topically as needed.     Continuous Glucose Receiver (FREESTYLE LIBRE 3 READER) DEVI Use to check blood glucose continuously throughout the day. 1 each 0   Continuous Glucose Sensor (FREESTYLE LIBRE 3 PLUS SENSOR) MISC Use to check blood glucose continuously throughout the day. Change sensor every 15 days. 2 each 6   elvitegravir-cobicistat-emtricitabine-tenofovir (GENVOYA ) 150-150-200-10 MG TABS tablet Take 1 tablet by mouth daily with breakfast. 30 tablet 0   Glucose Blood (BLOOD GLUCOSE TEST STRIPS) STRP Use as directed 3 times daily to check blood sugar 100 strip 0   insulin  glargine (LANTUS ) 100 UNIT/ML Solostar Pen Inject 25 Units into the skin daily. 30 mL 2   insulin  lispro (HUMALOG ) 100 UNIT/ML KwikPen Inject 0-12 Units into the skin with breakfast, with lunch, and with evening meal. As per sliding scale.For blood sugars 0-150 give 0 units of  insulin , 151-200 give 2 units of insulin , 201-250 give 4 units, 251-300 give 6 units, 301-350 give 8 units, 351-400 give 10 units,> 400 give 12 units 30 mL 2   Insulin  Pen Needle 31G X 8 MM MISC Use as directed three times daily. 100 each 0   Lancet Device MISC Use as directed three times daily. 1 each 0   mirtazapine  (REMERON ) 15 MG tablet Take 0.5 tablets (7.5 mg total) by mouth at bedtime. 90 tablet 1   ondansetron   (ZOFRAN ) 4 MG tablet Take 1 tablet (4 mg total) by mouth every 8 (eight) hours as needed for nausea or vomiting. 12 tablet 0   ondansetron  (ZOFRAN -ODT) 4 MG disintegrating tablet Take 1 tablet (4 mg total) by mouth every 8 (eight) hours as needed for nausea or vomiting. 12 tablet 0   sertraline  (ZOLOFT ) 100 MG tablet Take 1.5 tablets (150 mg total) by mouth daily. 135 tablet 1   valsartan  (DIOVAN ) 80 MG tablet Take 1 tablet (80 mg total) by mouth daily. 90 tablet 1   Lancets MISC Use 3 (three) times daily to check blood sugar as directed. 100 each 0   terbinafine  (LAMISIL  AT) 1 % cream Apply 1 Application topically 2 (two) times daily. (Patient not taking: Reported on 03/01/2024) 30 g 1   No facility-administered medications prior to visit.     Past Medical History:  Diagnosis Date   Anxiety    Asthma    Closed displaced comminuted fracture of shaft of right tibia 11/01/2017   Closed fracture of base of fifth metatarsal bone of right foot 11/13/2017   Closed fracture of medial malleolus of right ankle 10/31/2017   Depression    Headache    only w/stress (11/01/2017)   HIV (human immunodeficiency virus infection) (HCC)    Hypoglycemia due to insulin  06/27/2022   Migraine    a few/month (11/01/2017)   Refusal of blood transfusions as patient is Jehovah's Witness    Seizure (HCC)    only w/my low blood sugars  (11/01/2017)   Type I diabetes mellitus (HCC)      Past Surgical History:  Procedure Laterality Date   FRACTURE SURGERY     IM NAILING TIBIA Right 11/01/2017   INTRAMEDULLARY (IM) NAIL TIBIALRightGeneral   ORIF ANKLE FRACTURE Right 11/01/2017   ORIF ANKLE FRACTURE Right 11/01/2017   Procedure: OPEN REDUCTION INTERNAL FIXATION (ORIF) ANKLE FRACTURE;  Surgeon: Kendal Franky SQUIBB, MD;  Location: [facility] OR;  Service: Orthopedics;  Laterality: Right;   TIBIA IM NAIL INSERTION Right 11/01/2017   Procedure: INTRAMEDULLARY (IM) NAIL TIBIAL;  Surgeon: Kendal Franky SQUIBB, MD;  Location: [facility] OR;   Service: Orthopedics;  Laterality: Right;        Review of Systems  Constitutional:  Positive for fatigue. Negative for appetite change, chills, fever and unexpected weight change.  Eyes:  Negative for visual disturbance.  Respiratory:  Negative for cough, chest tightness, shortness of breath and wheezing.   Cardiovascular:  Negative for chest pain and leg swelling.  Gastrointestinal:  Negative for abdominal pain, constipation, diarrhea, nausea and vomiting.  Genitourinary:  Negative for dysuria, flank pain, frequency, genital sores, hematuria and urgency.  Skin:  Negative for rash.  Allergic/Immunologic: Negative for immunocompromised state.  Neurological:  Negative for dizziness and headaches.     Objective:   BP (!) 159/91   Pulse 92   Temp 99 F (37.2 C) (Oral)   Ht 6' (1.829 m)   Wt 200 lb (90.7 kg)   SpO2  97%   BMI 27.12 kg/m  Nursing note and vital signs reviewed.  Physical Exam Constitutional:      General: He is not in acute distress.    Appearance: He is well-developed. He is ill-appearing.  Cardiovascular:     Rate and Rhythm: Normal rate and regular rhythm.     Heart sounds: Normal heart sounds.  Pulmonary:     Effort: Pulmonary effort is normal.     Breath sounds: Normal breath sounds.  Skin:    General: Skin is warm and dry.  Neurological:     Mental Status: He is alert and oriented to person, place, and time.          01/30/2024    9:13 AM 01/06/2023    4:11 PM 08/16/2022   10:51 AM 08/03/2022   10:35 AM 02/10/2022   11:16 AM  Depression screen PHQ 2/9  Decreased Interest 1 0 3 0 3  Down, Depressed, Hopeless 2 0 3 0 3  PHQ - 2 Score 3 0 6 0 6  Altered sleeping 3 2 3  3   Tired, decreased energy 3 0 3  3  Change in appetite 3 2 3  1   Feeling bad or failure about yourself  3 0 3  3  Trouble concentrating 3 2 3  2   Moving slowly or fidgety/restless 0 0 3  1  Suicidal thoughts 1 0   3  PHQ-9 Score 19 6  24   22       Data saved with a  previous flowsheet row definition        01/30/2024    9:13 AM 01/06/2023    4:11 PM 08/16/2022   10:52 AM 02/10/2022   11:16 AM  GAD 7 : Generalized Anxiety Score  Nervous, Anxious, on Edge 3 2 3 3   Control/stop worrying 3 0 3 2  Worry too much - different things 3 0 3 2  Trouble relaxing 3 2 3 3   Restless 3 0 3 2  Easily annoyed or irritable 3 2 3 3   Afraid - awful might happen 1 0 3 3  Total GAD 7 Score 19 6 21 18         Assessment & Plan:    Patient Active Problem List   Diagnosis Date Noted   Positive screening for depression on 9-item Patient Health Questionnaire (PHQ-9) 03/01/2024   DKA (diabetic ketoacidosis) (HCC) 06/01/2023   Hypoglycemia 10/01/2022   HIV (human immunodeficiency virus infection) (HCC) 10/01/2022   CKD (chronic kidney disease) stage 2, GFR 60-89 ml/min 10/01/2022   Homelessness 08/16/2022   Hypocalcemia 03/21/2022   Routine screening for STI (sexually transmitted infection) 02/03/2022   Essential hypertension 09/28/2020   History of suicidal ideation 02/09/2020   MDD (major depressive disorder), recurrent episode, severe (HCC) 01/15/2020   Healthcare maintenance 12/05/2019   Syphilis 01/15/2019   HIV infection (HCC) 11/11/2014   Asthma 10/02/2010   Type 1 diabetes (HCC) 03/01/1991     Problem List Items Addressed This Visit       Other   HIV infection [facility])   [name] continues to have adequately controlled virus with good adherence and tolerance to Genvoya .  Reviewed previous lab work and discussed plan of care and U equals U.  Social determinants of health reviewed with bus passes provided if needed.  Acute onset nausea in the last 3 months not likely related to Genvoya  and would suspect most likely related to poorly controlled diabetes.  Cannot be fully excluded  and will change Genvoya  to [facility] for 2 weeks to determine any effect.  Sample of Biktarvy provided. He will notify provider of decision to stay with Biktarvy or return to  Genvoya .  Check blood work.  Plan for follow-up in 3 months or sooner if needed with lab work on the same day.       Relevant Orders   Comprehensive metabolic panel with GFR   HIV-1 RNA quant-no reflex-bld   T-helper cells (CD4) count (not at [facility])   Syphilis   Stable with most recent titer 1: 16 and no evidence of new infection or indications for treatment.  Discussed RPR monitoring and when new treatment would be indicated.  Check RPR.      Relevant Orders   RPR W/RFLX TO RPR TITER, TREPONEMAL AB, SCREEN AND DIAGNOSIS   Healthcare maintenance - Primary   Discussed importance of safe sexual practice and condom use. Condoms and site specific STD testing offered.  Vaccinations reviewed and deferred secondary to elevated temperature.  Due for dental care which he will schedule.  Due for anal cancer screening at next office visit.      Positive screening for depression on 9-item Patient Health Questionnaire (PHQ-9)   Positive PHQ9 Score of 19 with history of depression and currently on mirtazapine  and sertraline . Continue management per Internal Medicine and consider referral for counseling.         I am having [name] maintain his Camphor-Menthol-Methyl Sal (TIGER BALM MUSCLE RUB EX), ondansetron , acetaminophen , Blood Glucose Monitor System, BLOOD GLUCOSE TEST STRIPS, Lancet Device, Lancets, Insulin  Pen Needle, mirtazapine , sertraline , insulin  lispro, FreeStyle Libre 3 Reader, amLODipine , FreeStyle Libre 3 Plus Sensor, valsartan , insulin  glargine, terbinafine , Genvoya , and ondansetron .   Follow-up: Return in about 3 months (around 05/30/2024). or sooner if needed.    Cathlyn July, MSN, FNP-C Nurse Practitioner [facility] Main number: [phone]      [1]  Allergies Allergen Reactions   Shellfish Allergy Anaphylaxis and Hives   Aspirin  Other (See Comments)    Lump in armpit   "

## 2024-03-01 NOTE — Assessment & Plan Note (Signed)
 Discussed importance of safe sexual practice and condom use. Condoms and site specific STD testing offered.  Vaccinations reviewed and deferred secondary to elevated temperature.  Due for dental care which he will schedule.  Due for anal cancer screening at next office visit.

## 2024-03-01 NOTE — Assessment & Plan Note (Addendum)
 [name] continues to have adequately controlled virus with good adherence and tolerance to Genvoya .  Reviewed previous lab work and discussed plan of care and U equals U.  Social determinants of health reviewed with bus passes provided if needed.  Acute onset nausea in the last 3 months not likely related to Genvoya  and would suspect most likely related to poorly controlled diabetes.  Cannot be fully excluded and will change Genvoya  to [facility] for 2 weeks to determine any effect.  Sample of Biktarvy provided. He will notify provider of decision to stay with Biktarvy or return to Genvoya .  Check blood work.  Plan for follow-up in 3 months or sooner if needed with lab work on the same day.

## 2024-03-01 NOTE — Assessment & Plan Note (Signed)
 Stable with most recent titer 1: 16 and no evidence of new infection or indications for treatment.  Discussed RPR monitoring and when new treatment would be indicated.  Check RPR.

## 2024-03-01 NOTE — Assessment & Plan Note (Signed)
 Positive PHQ9 Score of 19 with history of depression and currently on mirtazapine  and sertraline . Continue management per Internal Medicine and consider referral for counseling.

## 2024-03-01 NOTE — Patient Instructions (Addendum)
 Nice to see you.  We will check your lab work today.  Continue to take your medication daily as prescribed.  Refills have been sent to the pharmacy.  Plan for follow up in 3 months or sooner if needed with lab work on the same day.  Have a great day and stay safe!

## 2024-03-02 ENCOUNTER — Telehealth: Payer: Self-pay | Admitting: Internal Medicine

## 2024-03-02 NOTE — Telephone Encounter (Signed)
 Let him know that labs showed hypoglycemia. To continue to check BS 3-4x per day. If getting readings BS <50, to decrease insulin . Take sugary food if needed in episodes of hypoglycemia

## 2024-03-06 ENCOUNTER — Other Ambulatory Visit: Payer: Self-pay | Admitting: Pharmacist

## 2024-03-06 LAB — COMPREHENSIVE METABOLIC PANEL WITH GFR
AG Ratio: 1.2 (calc) (ref 1.0–2.5)
ALT: 14 U/L (ref 9–46)
AST: 16 U/L (ref 10–40)
Albumin: 3.6 g/dL (ref 3.6–5.1)
Alkaline phosphatase (APISO): 59 U/L (ref 36–130)
BUN/Creatinine Ratio: 12 (calc) (ref 6–22)
BUN: 20 mg/dL (ref 7–25)
CO2: 28 mmol/L (ref 20–32)
Calcium: 9.2 mg/dL (ref 8.6–10.3)
Chloride: 106 mmol/L (ref 98–110)
Creat: 1.61 mg/dL — ABNORMAL HIGH (ref 0.60–1.26)
Globulin: 2.9 g/dL (ref 1.9–3.7)
Glucose, Bld: 34 mg/dL — CL (ref 65–99)
Potassium: 4.4 mmol/L (ref 3.5–5.3)
Sodium: 141 mmol/L (ref 135–146)
Total Bilirubin: 0.4 mg/dL (ref 0.2–1.2)
Total Protein: 6.5 g/dL (ref 6.1–8.1)
eGFR: 56 mL/min/1.73m2 — ABNORMAL LOW

## 2024-03-06 LAB — RPR TITER: RPR Titer: 1:16 {titer} — ABNORMAL HIGH

## 2024-03-06 LAB — T-HELPER CELLS (CD4) COUNT (NOT AT ARMC)
Absolute CD4: 1082 {cells}/uL (ref 490–1740)
CD4 T Helper %: 34 % (ref 30–61)
Total lymphocyte count: 3195 {cells}/uL (ref 850–3900)

## 2024-03-06 LAB — SYPHILIS: RPR W/REFLEX TO RPR TITER AND TREPONEMAL ANTIBODIES, TRADITIONAL SCREENING AND DIAGNOSIS ALGORITHM: RPR Ser Ql: REACTIVE — AB

## 2024-03-06 LAB — T PALLIDUM AB: T Pallidum Abs: POSITIVE — AB

## 2024-03-06 LAB — HIV-1 RNA QUANT-NO REFLEX-BLD
HIV 1 RNA Quant: NOT DETECTED {copies}/mL
HIV-1 RNA Quant, Log: NOT DETECTED {Log_copies}/mL

## 2024-03-06 MED ORDER — BICTEGRAVIR-EMTRICITAB-TENOFOV 50-200-25 MG PO TABS: 1.0000 | ORAL_TABLET | Freq: Every day | ORAL | Status: AC

## 2024-03-06 NOTE — Progress Notes (Signed)
 Medication Samples have been provided to the patient.  Drug name: Biktarvy         Strength: 50/200/25 mg       Qty: 14 tablets (2 bottles) LOT: CVDSXA   Exp.Date: 1/28   Samples requested by Cathlyn July, NP.  Dosing instructions: Take one tablet by mouth once daily  The patient has been instructed regarding the correct time, dose, and frequency of taking this medication, including desired effects and most common side effects.   Alan Geralds, PharmD, CPP, BCIDP, AAHIVP Clinical Pharmacist Practitioner Infectious Diseases Clinical Pharmacist [facility]

## 2024-03-18 ENCOUNTER — Ambulatory Visit: Payer: MEDICAID | Admitting: "Endocrinology

## 2024-03-26 ENCOUNTER — Telehealth: Payer: Self-pay | Admitting: Pharmacy Technician

## 2024-03-26 ENCOUNTER — Other Ambulatory Visit: Payer: Self-pay

## 2024-03-26 NOTE — Telephone Encounter (Signed)
 Where is this request coming from? [facility] Medication: Freestyle Libre 3 Plus Sensor Prior authorization required? Yes If YES, on primary or secondary insurance? Primary Comments:

## 2024-03-27 ENCOUNTER — Other Ambulatory Visit (HOSPITAL_COMMUNITY): Payer: Self-pay

## 2024-03-27 ENCOUNTER — Other Ambulatory Visit: Payer: Self-pay

## 2024-03-28 ENCOUNTER — Other Ambulatory Visit: Payer: Self-pay

## 2024-04-02 ENCOUNTER — Other Ambulatory Visit: Payer: Self-pay

## 2024-04-04 ENCOUNTER — Other Ambulatory Visit: Payer: Self-pay

## 2024-04-29 ENCOUNTER — Ambulatory Visit: Payer: MEDICAID | Admitting: Family Medicine

## 2024-07-25 ENCOUNTER — Ambulatory Visit: Payer: MEDICAID | Admitting: "Endocrinology
